# Patient Record
Sex: Female | Born: 1940
Health system: Southern US, Community
[De-identification: ages and names within clinical notes are randomized; demographics above are authoritative.]

## PROBLEM LIST (undated history)

## (undated) DIAGNOSIS — M545 Low back pain: Secondary | ICD-10-CM

## (undated) DIAGNOSIS — M549 Dorsalgia, unspecified: Secondary | ICD-10-CM

## (undated) DIAGNOSIS — C801 Malignant (primary) neoplasm, unspecified: Secondary | ICD-10-CM

## (undated) DIAGNOSIS — M1990 Unspecified osteoarthritis, unspecified site: Secondary | ICD-10-CM

## (undated) DIAGNOSIS — H04123 Dry eye syndrome of bilateral lacrimal glands: Secondary | ICD-10-CM

## (undated) DIAGNOSIS — I10 Essential (primary) hypertension: Secondary | ICD-10-CM

## (undated) DIAGNOSIS — O9989 Other specified diseases and conditions complicating pregnancy, childbirth and the puerperium: Secondary | ICD-10-CM

## (undated) DIAGNOSIS — J45909 Unspecified asthma, uncomplicated: Secondary | ICD-10-CM

## (undated) DIAGNOSIS — G250 Essential tremor: Secondary | ICD-10-CM

## (undated) DIAGNOSIS — C50919 Malignant neoplasm of unspecified site of unspecified female breast: Secondary | ICD-10-CM

## (undated) DIAGNOSIS — Z0000 Encounter for general adult medical examination without abnormal findings: Secondary | ICD-10-CM

## (undated) DIAGNOSIS — E782 Mixed hyperlipidemia: Secondary | ICD-10-CM

## (undated) DIAGNOSIS — M4802 Spinal stenosis, cervical region: Secondary | ICD-10-CM

## (undated) DIAGNOSIS — E669 Obesity, unspecified: Secondary | ICD-10-CM

## (undated) DIAGNOSIS — Z8619 Personal history of other infectious and parasitic diseases: Secondary | ICD-10-CM

## (undated) HISTORY — PX: CHOLECYSTECTOMY: SHX55

## (undated) HISTORY — DX: Mixed hyperlipidemia: E78.2

## (undated) HISTORY — DX: Dorsalgia, unspecified: M54.9

## (undated) HISTORY — PX: REDUCTION MAMMAPLASTY: SUR839

## (undated) HISTORY — DX: Unspecified osteoarthritis, unspecified site: M19.90

## (undated) HISTORY — DX: Dry eye syndrome of bilateral lacrimal glands: H04.123

## (undated) HISTORY — DX: Low back pain: M54.5

## (undated) HISTORY — DX: Personal history of other infectious and parasitic diseases: Z86.19

## (undated) HISTORY — PX: BREAST REDUCTION SURGERY: SHX8

## (undated) HISTORY — DX: Other specified diseases and conditions complicating pregnancy, childbirth and the puerperium: O99.89

## (undated) HISTORY — DX: Unspecified asthma, uncomplicated: J45.909

## (undated) HISTORY — DX: Essential tremor: G25.0

## (undated) HISTORY — DX: Malignant (primary) neoplasm, unspecified: C80.1

## (undated) HISTORY — PX: BACK SURGERY: SHX140

## (undated) HISTORY — PX: ROTATOR CUFF REPAIR: SHX139

## (undated) HISTORY — PX: REPLACEMENT TOTAL KNEE BILATERAL: SUR1225

## (undated) HISTORY — DX: Spinal stenosis, cervical region: M48.02

## (undated) HISTORY — DX: Encounter for general adult medical examination without abnormal findings: Z00.00

## (undated) HISTORY — DX: Obesity, unspecified: E66.9

## (undated) HISTORY — DX: Essential (primary) hypertension: I10

## (undated) HISTORY — PX: MASTECTOMY: SHX3

---

## 2006-02-07 HISTORY — PX: EYE SURGERY: SHX253

## 2010-10-28 LAB — HM DEXA SCAN

## 2011-10-28 LAB — HM MAMMOGRAPHY

## 2012-11-07 LAB — HM COLONOSCOPY

## 2013-02-19 DIAGNOSIS — M669 Spontaneous rupture of unspecified tendon: Secondary | ICD-10-CM | POA: Diagnosis not present

## 2013-02-19 DIAGNOSIS — M19079 Primary osteoarthritis, unspecified ankle and foot: Secondary | ICD-10-CM | POA: Diagnosis not present

## 2013-02-19 DIAGNOSIS — M1710 Unilateral primary osteoarthritis, unspecified knee: Secondary | ICD-10-CM | POA: Diagnosis not present

## 2013-02-19 DIAGNOSIS — M25579 Pain in unspecified ankle and joints of unspecified foot: Secondary | ICD-10-CM | POA: Diagnosis not present

## 2013-03-04 DIAGNOSIS — J45909 Unspecified asthma, uncomplicated: Secondary | ICD-10-CM | POA: Diagnosis not present

## 2013-03-04 DIAGNOSIS — M949 Disorder of cartilage, unspecified: Secondary | ICD-10-CM | POA: Diagnosis not present

## 2013-03-04 DIAGNOSIS — E782 Mixed hyperlipidemia: Secondary | ICD-10-CM | POA: Diagnosis not present

## 2013-03-04 DIAGNOSIS — R259 Unspecified abnormal involuntary movements: Secondary | ICD-10-CM | POA: Diagnosis not present

## 2013-03-04 DIAGNOSIS — M545 Low back pain, unspecified: Secondary | ICD-10-CM | POA: Diagnosis not present

## 2013-03-04 DIAGNOSIS — M79609 Pain in unspecified limb: Secondary | ICD-10-CM | POA: Diagnosis not present

## 2013-03-04 DIAGNOSIS — M899 Disorder of bone, unspecified: Secondary | ICD-10-CM | POA: Diagnosis not present

## 2013-03-04 DIAGNOSIS — I10 Essential (primary) hypertension: Secondary | ICD-10-CM | POA: Diagnosis not present

## 2013-03-04 DIAGNOSIS — Z01818 Encounter for other preprocedural examination: Secondary | ICD-10-CM | POA: Diagnosis not present

## 2013-03-05 DIAGNOSIS — Z01818 Encounter for other preprocedural examination: Secondary | ICD-10-CM | POA: Diagnosis not present

## 2013-03-05 DIAGNOSIS — E782 Mixed hyperlipidemia: Secondary | ICD-10-CM | POA: Diagnosis not present

## 2013-03-11 DIAGNOSIS — Z7982 Long term (current) use of aspirin: Secondary | ICD-10-CM | POA: Diagnosis not present

## 2013-03-11 DIAGNOSIS — G250 Essential tremor: Secondary | ICD-10-CM | POA: Diagnosis present

## 2013-03-11 DIAGNOSIS — Z4889 Encounter for other specified surgical aftercare: Secondary | ICD-10-CM | POA: Diagnosis not present

## 2013-03-11 DIAGNOSIS — Z96659 Presence of unspecified artificial knee joint: Secondary | ICD-10-CM | POA: Diagnosis not present

## 2013-03-11 DIAGNOSIS — Z471 Aftercare following joint replacement surgery: Secondary | ICD-10-CM | POA: Diagnosis not present

## 2013-03-11 DIAGNOSIS — Z880 Allergy status to penicillin: Secondary | ICD-10-CM | POA: Diagnosis not present

## 2013-03-11 DIAGNOSIS — E669 Obesity, unspecified: Secondary | ICD-10-CM | POA: Diagnosis not present

## 2013-03-11 DIAGNOSIS — Z79899 Other long term (current) drug therapy: Secondary | ICD-10-CM | POA: Diagnosis not present

## 2013-03-11 DIAGNOSIS — D72829 Elevated white blood cell count, unspecified: Secondary | ICD-10-CM | POA: Diagnosis not present

## 2013-03-11 DIAGNOSIS — Z9089 Acquired absence of other organs: Secondary | ICD-10-CM | POA: Diagnosis not present

## 2013-03-11 DIAGNOSIS — Z6841 Body Mass Index (BMI) 40.0 and over, adult: Secondary | ICD-10-CM | POA: Diagnosis not present

## 2013-03-11 DIAGNOSIS — I10 Essential (primary) hypertension: Secondary | ICD-10-CM | POA: Diagnosis not present

## 2013-03-11 DIAGNOSIS — M25569 Pain in unspecified knee: Secondary | ICD-10-CM | POA: Diagnosis not present

## 2013-03-11 DIAGNOSIS — M1710 Unilateral primary osteoarthritis, unspecified knee: Secondary | ICD-10-CM | POA: Diagnosis not present

## 2013-03-14 DIAGNOSIS — M1710 Unilateral primary osteoarthritis, unspecified knee: Secondary | ICD-10-CM | POA: Diagnosis not present

## 2013-03-14 DIAGNOSIS — E669 Obesity, unspecified: Secondary | ICD-10-CM | POA: Diagnosis not present

## 2013-03-14 DIAGNOSIS — R5381 Other malaise: Secondary | ICD-10-CM | POA: Diagnosis not present

## 2013-03-14 DIAGNOSIS — Z6841 Body Mass Index (BMI) 40.0 and over, adult: Secondary | ICD-10-CM | POA: Diagnosis not present

## 2013-03-14 DIAGNOSIS — Z853 Personal history of malignant neoplasm of breast: Secondary | ICD-10-CM | POA: Diagnosis not present

## 2013-03-14 DIAGNOSIS — Z5189 Encounter for other specified aftercare: Secondary | ICD-10-CM | POA: Diagnosis not present

## 2013-03-14 DIAGNOSIS — Z471 Aftercare following joint replacement surgery: Secondary | ICD-10-CM | POA: Diagnosis not present

## 2013-03-14 DIAGNOSIS — B965 Pseudomonas (aeruginosa) (mallei) (pseudomallei) as the cause of diseases classified elsewhere: Secondary | ICD-10-CM | POA: Diagnosis not present

## 2013-03-14 DIAGNOSIS — N390 Urinary tract infection, site not specified: Secondary | ICD-10-CM | POA: Diagnosis not present

## 2013-03-14 DIAGNOSIS — D72829 Elevated white blood cell count, unspecified: Secondary | ICD-10-CM | POA: Diagnosis not present

## 2013-03-14 DIAGNOSIS — I10 Essential (primary) hypertension: Secondary | ICD-10-CM | POA: Diagnosis not present

## 2013-03-14 DIAGNOSIS — M1990 Unspecified osteoarthritis, unspecified site: Secondary | ICD-10-CM | POA: Diagnosis not present

## 2013-03-14 DIAGNOSIS — R269 Unspecified abnormalities of gait and mobility: Secondary | ICD-10-CM | POA: Diagnosis not present

## 2013-03-14 DIAGNOSIS — Z9089 Acquired absence of other organs: Secondary | ICD-10-CM | POA: Diagnosis not present

## 2013-03-14 DIAGNOSIS — Z96659 Presence of unspecified artificial knee joint: Secondary | ICD-10-CM | POA: Diagnosis not present

## 2013-03-14 DIAGNOSIS — Z9010 Acquired absence of unspecified breast and nipple: Secondary | ICD-10-CM | POA: Diagnosis not present

## 2013-03-14 DIAGNOSIS — IMO0002 Reserved for concepts with insufficient information to code with codable children: Secondary | ICD-10-CM | POA: Diagnosis not present

## 2013-03-14 DIAGNOSIS — G250 Essential tremor: Secondary | ICD-10-CM | POA: Diagnosis not present

## 2013-03-29 DIAGNOSIS — R269 Unspecified abnormalities of gait and mobility: Secondary | ICD-10-CM | POA: Diagnosis not present

## 2013-03-29 DIAGNOSIS — G8918 Other acute postprocedural pain: Secondary | ICD-10-CM | POA: Diagnosis not present

## 2013-03-29 DIAGNOSIS — Z96659 Presence of unspecified artificial knee joint: Secondary | ICD-10-CM | POA: Diagnosis not present

## 2013-03-29 DIAGNOSIS — Z471 Aftercare following joint replacement surgery: Secondary | ICD-10-CM | POA: Diagnosis not present

## 2013-03-29 DIAGNOSIS — I119 Hypertensive heart disease without heart failure: Secondary | ICD-10-CM | POA: Diagnosis not present

## 2013-03-29 DIAGNOSIS — I2510 Atherosclerotic heart disease of native coronary artery without angina pectoris: Secondary | ICD-10-CM | POA: Diagnosis not present

## 2013-03-30 DIAGNOSIS — Z96659 Presence of unspecified artificial knee joint: Secondary | ICD-10-CM | POA: Diagnosis not present

## 2013-03-30 DIAGNOSIS — I119 Hypertensive heart disease without heart failure: Secondary | ICD-10-CM | POA: Diagnosis not present

## 2013-03-30 DIAGNOSIS — I2510 Atherosclerotic heart disease of native coronary artery without angina pectoris: Secondary | ICD-10-CM | POA: Diagnosis not present

## 2013-03-30 DIAGNOSIS — G8918 Other acute postprocedural pain: Secondary | ICD-10-CM | POA: Diagnosis not present

## 2013-03-30 DIAGNOSIS — R269 Unspecified abnormalities of gait and mobility: Secondary | ICD-10-CM | POA: Diagnosis not present

## 2013-03-30 DIAGNOSIS — Z471 Aftercare following joint replacement surgery: Secondary | ICD-10-CM | POA: Diagnosis not present

## 2013-04-02 DIAGNOSIS — R269 Unspecified abnormalities of gait and mobility: Secondary | ICD-10-CM | POA: Diagnosis not present

## 2013-04-02 DIAGNOSIS — Z96659 Presence of unspecified artificial knee joint: Secondary | ICD-10-CM | POA: Diagnosis not present

## 2013-04-02 DIAGNOSIS — Z471 Aftercare following joint replacement surgery: Secondary | ICD-10-CM | POA: Diagnosis not present

## 2013-04-02 DIAGNOSIS — G8918 Other acute postprocedural pain: Secondary | ICD-10-CM | POA: Diagnosis not present

## 2013-04-02 DIAGNOSIS — I119 Hypertensive heart disease without heart failure: Secondary | ICD-10-CM | POA: Diagnosis not present

## 2013-04-02 DIAGNOSIS — I2510 Atherosclerotic heart disease of native coronary artery without angina pectoris: Secondary | ICD-10-CM | POA: Diagnosis not present

## 2013-04-04 DIAGNOSIS — I119 Hypertensive heart disease without heart failure: Secondary | ICD-10-CM | POA: Diagnosis not present

## 2013-04-04 DIAGNOSIS — G8918 Other acute postprocedural pain: Secondary | ICD-10-CM | POA: Diagnosis not present

## 2013-04-04 DIAGNOSIS — I2510 Atherosclerotic heart disease of native coronary artery without angina pectoris: Secondary | ICD-10-CM | POA: Diagnosis not present

## 2013-04-04 DIAGNOSIS — R269 Unspecified abnormalities of gait and mobility: Secondary | ICD-10-CM | POA: Diagnosis not present

## 2013-04-04 DIAGNOSIS — Z471 Aftercare following joint replacement surgery: Secondary | ICD-10-CM | POA: Diagnosis not present

## 2013-04-04 DIAGNOSIS — Z96659 Presence of unspecified artificial knee joint: Secondary | ICD-10-CM | POA: Diagnosis not present

## 2013-04-05 DIAGNOSIS — S43429A Sprain of unspecified rotator cuff capsule, initial encounter: Secondary | ICD-10-CM | POA: Diagnosis not present

## 2013-04-05 DIAGNOSIS — M1710 Unilateral primary osteoarthritis, unspecified knee: Secondary | ICD-10-CM | POA: Diagnosis not present

## 2013-04-06 DIAGNOSIS — I2510 Atherosclerotic heart disease of native coronary artery without angina pectoris: Secondary | ICD-10-CM | POA: Diagnosis not present

## 2013-04-06 DIAGNOSIS — G8918 Other acute postprocedural pain: Secondary | ICD-10-CM | POA: Diagnosis not present

## 2013-04-06 DIAGNOSIS — R269 Unspecified abnormalities of gait and mobility: Secondary | ICD-10-CM | POA: Diagnosis not present

## 2013-04-06 DIAGNOSIS — Z96659 Presence of unspecified artificial knee joint: Secondary | ICD-10-CM | POA: Diagnosis not present

## 2013-04-06 DIAGNOSIS — I119 Hypertensive heart disease without heart failure: Secondary | ICD-10-CM | POA: Diagnosis not present

## 2013-04-06 DIAGNOSIS — Z471 Aftercare following joint replacement surgery: Secondary | ICD-10-CM | POA: Diagnosis not present

## 2013-04-09 DIAGNOSIS — I2510 Atherosclerotic heart disease of native coronary artery without angina pectoris: Secondary | ICD-10-CM | POA: Diagnosis not present

## 2013-04-09 DIAGNOSIS — R269 Unspecified abnormalities of gait and mobility: Secondary | ICD-10-CM | POA: Diagnosis not present

## 2013-04-09 DIAGNOSIS — Z471 Aftercare following joint replacement surgery: Secondary | ICD-10-CM | POA: Diagnosis not present

## 2013-04-09 DIAGNOSIS — G8918 Other acute postprocedural pain: Secondary | ICD-10-CM | POA: Diagnosis not present

## 2013-04-09 DIAGNOSIS — Z96659 Presence of unspecified artificial knee joint: Secondary | ICD-10-CM | POA: Diagnosis not present

## 2013-04-09 DIAGNOSIS — I119 Hypertensive heart disease without heart failure: Secondary | ICD-10-CM | POA: Diagnosis not present

## 2013-04-10 DIAGNOSIS — R0602 Shortness of breath: Secondary | ICD-10-CM | POA: Diagnosis not present

## 2013-04-10 DIAGNOSIS — I10 Essential (primary) hypertension: Secondary | ICD-10-CM | POA: Diagnosis not present

## 2013-04-10 DIAGNOSIS — I779 Disorder of arteries and arterioles, unspecified: Secondary | ICD-10-CM | POA: Diagnosis not present

## 2013-04-10 DIAGNOSIS — E669 Obesity, unspecified: Secondary | ICD-10-CM | POA: Diagnosis not present

## 2013-04-11 DIAGNOSIS — G8918 Other acute postprocedural pain: Secondary | ICD-10-CM | POA: Diagnosis not present

## 2013-04-11 DIAGNOSIS — I2510 Atherosclerotic heart disease of native coronary artery without angina pectoris: Secondary | ICD-10-CM | POA: Diagnosis not present

## 2013-04-11 DIAGNOSIS — R269 Unspecified abnormalities of gait and mobility: Secondary | ICD-10-CM | POA: Diagnosis not present

## 2013-04-11 DIAGNOSIS — I119 Hypertensive heart disease without heart failure: Secondary | ICD-10-CM | POA: Diagnosis not present

## 2013-04-11 DIAGNOSIS — Z471 Aftercare following joint replacement surgery: Secondary | ICD-10-CM | POA: Diagnosis not present

## 2013-04-11 DIAGNOSIS — Z96659 Presence of unspecified artificial knee joint: Secondary | ICD-10-CM | POA: Diagnosis not present

## 2013-04-13 DIAGNOSIS — Z96659 Presence of unspecified artificial knee joint: Secondary | ICD-10-CM | POA: Diagnosis not present

## 2013-04-13 DIAGNOSIS — R269 Unspecified abnormalities of gait and mobility: Secondary | ICD-10-CM | POA: Diagnosis not present

## 2013-04-13 DIAGNOSIS — I119 Hypertensive heart disease without heart failure: Secondary | ICD-10-CM | POA: Diagnosis not present

## 2013-04-13 DIAGNOSIS — Z471 Aftercare following joint replacement surgery: Secondary | ICD-10-CM | POA: Diagnosis not present

## 2013-04-13 DIAGNOSIS — G8918 Other acute postprocedural pain: Secondary | ICD-10-CM | POA: Diagnosis not present

## 2013-04-13 DIAGNOSIS — I2510 Atherosclerotic heart disease of native coronary artery without angina pectoris: Secondary | ICD-10-CM | POA: Diagnosis not present

## 2013-04-15 DIAGNOSIS — I2510 Atherosclerotic heart disease of native coronary artery without angina pectoris: Secondary | ICD-10-CM | POA: Diagnosis not present

## 2013-04-15 DIAGNOSIS — I119 Hypertensive heart disease without heart failure: Secondary | ICD-10-CM | POA: Diagnosis not present

## 2013-04-15 DIAGNOSIS — R269 Unspecified abnormalities of gait and mobility: Secondary | ICD-10-CM | POA: Diagnosis not present

## 2013-04-15 DIAGNOSIS — Z96659 Presence of unspecified artificial knee joint: Secondary | ICD-10-CM | POA: Diagnosis not present

## 2013-04-15 DIAGNOSIS — G8918 Other acute postprocedural pain: Secondary | ICD-10-CM | POA: Diagnosis not present

## 2013-04-15 DIAGNOSIS — Z471 Aftercare following joint replacement surgery: Secondary | ICD-10-CM | POA: Diagnosis not present

## 2013-04-17 DIAGNOSIS — R269 Unspecified abnormalities of gait and mobility: Secondary | ICD-10-CM | POA: Diagnosis not present

## 2013-04-17 DIAGNOSIS — I2510 Atherosclerotic heart disease of native coronary artery without angina pectoris: Secondary | ICD-10-CM | POA: Diagnosis not present

## 2013-04-17 DIAGNOSIS — I119 Hypertensive heart disease without heart failure: Secondary | ICD-10-CM | POA: Diagnosis not present

## 2013-04-17 DIAGNOSIS — Z471 Aftercare following joint replacement surgery: Secondary | ICD-10-CM | POA: Diagnosis not present

## 2013-04-17 DIAGNOSIS — Z96659 Presence of unspecified artificial knee joint: Secondary | ICD-10-CM | POA: Diagnosis not present

## 2013-04-17 DIAGNOSIS — G8918 Other acute postprocedural pain: Secondary | ICD-10-CM | POA: Diagnosis not present

## 2013-04-20 DIAGNOSIS — Z96659 Presence of unspecified artificial knee joint: Secondary | ICD-10-CM | POA: Diagnosis not present

## 2013-04-20 DIAGNOSIS — R269 Unspecified abnormalities of gait and mobility: Secondary | ICD-10-CM | POA: Diagnosis not present

## 2013-04-20 DIAGNOSIS — Z471 Aftercare following joint replacement surgery: Secondary | ICD-10-CM | POA: Diagnosis not present

## 2013-04-20 DIAGNOSIS — I119 Hypertensive heart disease without heart failure: Secondary | ICD-10-CM | POA: Diagnosis not present

## 2013-04-20 DIAGNOSIS — G8918 Other acute postprocedural pain: Secondary | ICD-10-CM | POA: Diagnosis not present

## 2013-04-20 DIAGNOSIS — I2510 Atherosclerotic heart disease of native coronary artery without angina pectoris: Secondary | ICD-10-CM | POA: Diagnosis not present

## 2013-04-24 DIAGNOSIS — G252 Other specified forms of tremor: Secondary | ICD-10-CM | POA: Diagnosis not present

## 2013-04-24 DIAGNOSIS — I10 Essential (primary) hypertension: Secondary | ICD-10-CM | POA: Diagnosis not present

## 2013-04-24 DIAGNOSIS — G2581 Restless legs syndrome: Secondary | ICD-10-CM | POA: Diagnosis not present

## 2013-04-24 DIAGNOSIS — G250 Essential tremor: Secondary | ICD-10-CM | POA: Diagnosis not present

## 2013-04-24 DIAGNOSIS — R252 Cramp and spasm: Secondary | ICD-10-CM | POA: Diagnosis not present

## 2013-04-26 DIAGNOSIS — Z471 Aftercare following joint replacement surgery: Secondary | ICD-10-CM | POA: Diagnosis not present

## 2013-04-26 DIAGNOSIS — I119 Hypertensive heart disease without heart failure: Secondary | ICD-10-CM | POA: Diagnosis not present

## 2013-04-26 DIAGNOSIS — R269 Unspecified abnormalities of gait and mobility: Secondary | ICD-10-CM | POA: Diagnosis not present

## 2013-04-26 DIAGNOSIS — Z96659 Presence of unspecified artificial knee joint: Secondary | ICD-10-CM | POA: Diagnosis not present

## 2013-04-26 DIAGNOSIS — G8918 Other acute postprocedural pain: Secondary | ICD-10-CM | POA: Diagnosis not present

## 2013-04-26 DIAGNOSIS — I2510 Atherosclerotic heart disease of native coronary artery without angina pectoris: Secondary | ICD-10-CM | POA: Diagnosis not present

## 2013-05-30 DIAGNOSIS — I10 Essential (primary) hypertension: Secondary | ICD-10-CM | POA: Diagnosis not present

## 2013-05-30 DIAGNOSIS — E782 Mixed hyperlipidemia: Secondary | ICD-10-CM | POA: Diagnosis not present

## 2013-05-30 DIAGNOSIS — G250 Essential tremor: Secondary | ICD-10-CM | POA: Diagnosis not present

## 2013-05-30 DIAGNOSIS — G252 Other specified forms of tremor: Secondary | ICD-10-CM | POA: Diagnosis not present

## 2013-06-27 DIAGNOSIS — L570 Actinic keratosis: Secondary | ICD-10-CM | POA: Diagnosis not present

## 2013-07-12 DIAGNOSIS — M1710 Unilateral primary osteoarthritis, unspecified knee: Secondary | ICD-10-CM | POA: Diagnosis not present

## 2013-07-17 DIAGNOSIS — I10 Essential (primary) hypertension: Secondary | ICD-10-CM | POA: Diagnosis not present

## 2013-07-17 DIAGNOSIS — G252 Other specified forms of tremor: Secondary | ICD-10-CM | POA: Diagnosis not present

## 2013-07-17 DIAGNOSIS — G2581 Restless legs syndrome: Secondary | ICD-10-CM | POA: Diagnosis not present

## 2013-07-17 DIAGNOSIS — G250 Essential tremor: Secondary | ICD-10-CM | POA: Diagnosis not present

## 2013-09-11 DIAGNOSIS — Z01810 Encounter for preprocedural cardiovascular examination: Secondary | ICD-10-CM | POA: Diagnosis not present

## 2013-09-11 DIAGNOSIS — M25569 Pain in unspecified knee: Secondary | ICD-10-CM | POA: Diagnosis not present

## 2013-09-13 DIAGNOSIS — Z01810 Encounter for preprocedural cardiovascular examination: Secondary | ICD-10-CM | POA: Diagnosis not present

## 2013-09-13 DIAGNOSIS — M1710 Unilateral primary osteoarthritis, unspecified knee: Secondary | ICD-10-CM | POA: Diagnosis not present

## 2013-09-13 DIAGNOSIS — Z01818 Encounter for other preprocedural examination: Secondary | ICD-10-CM | POA: Diagnosis not present

## 2013-09-16 DIAGNOSIS — M76829 Posterior tibial tendinitis, unspecified leg: Secondary | ICD-10-CM | POA: Diagnosis not present

## 2013-09-16 DIAGNOSIS — M25579 Pain in unspecified ankle and joints of unspecified foot: Secondary | ICD-10-CM | POA: Diagnosis not present

## 2013-09-16 DIAGNOSIS — B351 Tinea unguium: Secondary | ICD-10-CM | POA: Diagnosis not present

## 2013-09-18 DIAGNOSIS — A488 Other specified bacterial diseases: Secondary | ICD-10-CM | POA: Diagnosis not present

## 2013-09-18 DIAGNOSIS — Z22330 Carrier of Group B streptococcus: Secondary | ICD-10-CM | POA: Diagnosis not present

## 2013-09-18 DIAGNOSIS — Z2239 Carrier of other specified bacterial diseases: Secondary | ICD-10-CM | POA: Diagnosis not present

## 2013-09-19 DIAGNOSIS — M25579 Pain in unspecified ankle and joints of unspecified foot: Secondary | ICD-10-CM | POA: Diagnosis not present

## 2013-09-23 DIAGNOSIS — M25473 Effusion, unspecified ankle: Secondary | ICD-10-CM | POA: Diagnosis not present

## 2013-09-23 DIAGNOSIS — M25579 Pain in unspecified ankle and joints of unspecified foot: Secondary | ICD-10-CM | POA: Diagnosis not present

## 2013-09-23 DIAGNOSIS — R609 Edema, unspecified: Secondary | ICD-10-CM | POA: Diagnosis not present

## 2013-09-23 DIAGNOSIS — M722 Plantar fascial fibromatosis: Secondary | ICD-10-CM | POA: Diagnosis not present

## 2013-09-27 DIAGNOSIS — M25579 Pain in unspecified ankle and joints of unspecified foot: Secondary | ICD-10-CM | POA: Diagnosis not present

## 2013-09-27 DIAGNOSIS — M729 Fibroblastic disorder, unspecified: Secondary | ICD-10-CM | POA: Diagnosis not present

## 2013-09-30 DIAGNOSIS — Z96659 Presence of unspecified artificial knee joint: Secondary | ICD-10-CM | POA: Diagnosis not present

## 2013-09-30 DIAGNOSIS — M25569 Pain in unspecified knee: Secondary | ICD-10-CM | POA: Diagnosis not present

## 2013-09-30 DIAGNOSIS — M729 Fibroblastic disorder, unspecified: Secondary | ICD-10-CM | POA: Diagnosis not present

## 2013-09-30 DIAGNOSIS — M25579 Pain in unspecified ankle and joints of unspecified foot: Secondary | ICD-10-CM | POA: Diagnosis not present

## 2013-10-02 DIAGNOSIS — M25569 Pain in unspecified knee: Secondary | ICD-10-CM | POA: Diagnosis not present

## 2013-10-02 DIAGNOSIS — M729 Fibroblastic disorder, unspecified: Secondary | ICD-10-CM | POA: Diagnosis not present

## 2013-10-02 DIAGNOSIS — M25579 Pain in unspecified ankle and joints of unspecified foot: Secondary | ICD-10-CM | POA: Diagnosis not present

## 2013-10-02 DIAGNOSIS — Z96659 Presence of unspecified artificial knee joint: Secondary | ICD-10-CM | POA: Diagnosis not present

## 2013-10-03 DIAGNOSIS — M729 Fibroblastic disorder, unspecified: Secondary | ICD-10-CM | POA: Diagnosis not present

## 2013-10-03 DIAGNOSIS — M25579 Pain in unspecified ankle and joints of unspecified foot: Secondary | ICD-10-CM | POA: Diagnosis not present

## 2013-10-07 DIAGNOSIS — I10 Essential (primary) hypertension: Secondary | ICD-10-CM | POA: Diagnosis not present

## 2013-10-07 DIAGNOSIS — M1990 Unspecified osteoarthritis, unspecified site: Secondary | ICD-10-CM | POA: Diagnosis not present

## 2013-10-07 DIAGNOSIS — Z96659 Presence of unspecified artificial knee joint: Secondary | ICD-10-CM | POA: Diagnosis not present

## 2013-10-07 DIAGNOSIS — R7309 Other abnormal glucose: Secondary | ICD-10-CM | POA: Diagnosis not present

## 2013-10-07 DIAGNOSIS — M25569 Pain in unspecified knee: Secondary | ICD-10-CM | POA: Diagnosis not present

## 2013-10-07 DIAGNOSIS — M1710 Unilateral primary osteoarthritis, unspecified knee: Secondary | ICD-10-CM | POA: Diagnosis not present

## 2013-10-07 DIAGNOSIS — Z4889 Encounter for other specified surgical aftercare: Secondary | ICD-10-CM | POA: Diagnosis not present

## 2013-10-08 DIAGNOSIS — M79609 Pain in unspecified limb: Secondary | ICD-10-CM | POA: Diagnosis not present

## 2013-10-08 DIAGNOSIS — M549 Dorsalgia, unspecified: Secondary | ICD-10-CM | POA: Diagnosis present

## 2013-10-08 DIAGNOSIS — M1990 Unspecified osteoarthritis, unspecified site: Secondary | ICD-10-CM | POA: Diagnosis present

## 2013-10-08 DIAGNOSIS — I10 Essential (primary) hypertension: Secondary | ICD-10-CM | POA: Diagnosis present

## 2013-10-08 DIAGNOSIS — Z9010 Acquired absence of unspecified breast and nipple: Secondary | ICD-10-CM | POA: Diagnosis not present

## 2013-10-08 DIAGNOSIS — R7309 Other abnormal glucose: Secondary | ICD-10-CM | POA: Diagnosis present

## 2013-10-08 DIAGNOSIS — K219 Gastro-esophageal reflux disease without esophagitis: Secondary | ICD-10-CM | POA: Diagnosis present

## 2013-10-08 DIAGNOSIS — Z9221 Personal history of antineoplastic chemotherapy: Secondary | ICD-10-CM | POA: Diagnosis not present

## 2013-10-08 DIAGNOSIS — Z853 Personal history of malignant neoplasm of breast: Secondary | ICD-10-CM | POA: Diagnosis not present

## 2013-10-10 DIAGNOSIS — A498 Other bacterial infections of unspecified site: Secondary | ICD-10-CM | POA: Diagnosis not present

## 2013-10-10 DIAGNOSIS — M25569 Pain in unspecified knee: Secondary | ICD-10-CM | POA: Diagnosis not present

## 2013-10-10 DIAGNOSIS — R269 Unspecified abnormalities of gait and mobility: Secondary | ICD-10-CM | POA: Diagnosis not present

## 2013-10-10 DIAGNOSIS — M549 Dorsalgia, unspecified: Secondary | ICD-10-CM | POA: Diagnosis not present

## 2013-10-10 DIAGNOSIS — D649 Anemia, unspecified: Secondary | ICD-10-CM | POA: Diagnosis not present

## 2013-10-10 DIAGNOSIS — Z9089 Acquired absence of other organs: Secondary | ICD-10-CM | POA: Diagnosis not present

## 2013-10-10 DIAGNOSIS — I10 Essential (primary) hypertension: Secondary | ICD-10-CM | POA: Diagnosis not present

## 2013-10-10 DIAGNOSIS — M1990 Unspecified osteoarthritis, unspecified site: Secondary | ICD-10-CM | POA: Diagnosis not present

## 2013-10-10 DIAGNOSIS — N390 Urinary tract infection, site not specified: Secondary | ICD-10-CM | POA: Diagnosis not present

## 2013-10-10 DIAGNOSIS — Z853 Personal history of malignant neoplasm of breast: Secondary | ICD-10-CM | POA: Diagnosis not present

## 2013-10-10 DIAGNOSIS — K219 Gastro-esophageal reflux disease without esophagitis: Secondary | ICD-10-CM | POA: Diagnosis not present

## 2013-10-10 DIAGNOSIS — Z9010 Acquired absence of unspecified breast and nipple: Secondary | ICD-10-CM | POA: Diagnosis not present

## 2013-10-10 DIAGNOSIS — Z5189 Encounter for other specified aftercare: Secondary | ICD-10-CM | POA: Diagnosis not present

## 2013-10-10 DIAGNOSIS — R29898 Other symptoms and signs involving the musculoskeletal system: Secondary | ICD-10-CM | POA: Diagnosis not present

## 2013-10-10 DIAGNOSIS — Z471 Aftercare following joint replacement surgery: Secondary | ICD-10-CM | POA: Diagnosis not present

## 2013-10-10 DIAGNOSIS — Z96659 Presence of unspecified artificial knee joint: Secondary | ICD-10-CM | POA: Diagnosis not present

## 2013-10-10 DIAGNOSIS — E8809 Other disorders of plasma-protein metabolism, not elsewhere classified: Secondary | ICD-10-CM | POA: Diagnosis not present

## 2013-10-10 DIAGNOSIS — G8929 Other chronic pain: Secondary | ICD-10-CM | POA: Diagnosis not present

## 2013-10-29 DIAGNOSIS — M25569 Pain in unspecified knee: Secondary | ICD-10-CM | POA: Diagnosis not present

## 2013-10-29 DIAGNOSIS — Z96659 Presence of unspecified artificial knee joint: Secondary | ICD-10-CM | POA: Diagnosis not present

## 2013-10-30 DIAGNOSIS — M25569 Pain in unspecified knee: Secondary | ICD-10-CM | POA: Diagnosis not present

## 2013-10-30 DIAGNOSIS — Z96659 Presence of unspecified artificial knee joint: Secondary | ICD-10-CM | POA: Diagnosis not present

## 2013-11-01 DIAGNOSIS — Z96659 Presence of unspecified artificial knee joint: Secondary | ICD-10-CM | POA: Diagnosis not present

## 2013-11-01 DIAGNOSIS — M25569 Pain in unspecified knee: Secondary | ICD-10-CM | POA: Diagnosis not present

## 2013-11-04 DIAGNOSIS — Z96659 Presence of unspecified artificial knee joint: Secondary | ICD-10-CM | POA: Diagnosis not present

## 2013-11-04 DIAGNOSIS — M25569 Pain in unspecified knee: Secondary | ICD-10-CM | POA: Diagnosis not present

## 2013-11-05 DIAGNOSIS — M76829 Posterior tibial tendinitis, unspecified leg: Secondary | ICD-10-CM | POA: Diagnosis not present

## 2013-11-05 DIAGNOSIS — Z471 Aftercare following joint replacement surgery: Secondary | ICD-10-CM | POA: Diagnosis not present

## 2013-11-05 DIAGNOSIS — M1710 Unilateral primary osteoarthritis, unspecified knee: Secondary | ICD-10-CM | POA: Diagnosis not present

## 2013-11-05 DIAGNOSIS — M25579 Pain in unspecified ankle and joints of unspecified foot: Secondary | ICD-10-CM | POA: Diagnosis not present

## 2013-11-05 DIAGNOSIS — M25569 Pain in unspecified knee: Secondary | ICD-10-CM | POA: Diagnosis not present

## 2013-11-06 DIAGNOSIS — Z96659 Presence of unspecified artificial knee joint: Secondary | ICD-10-CM | POA: Diagnosis not present

## 2013-11-06 DIAGNOSIS — M25569 Pain in unspecified knee: Secondary | ICD-10-CM | POA: Diagnosis not present

## 2013-11-08 DIAGNOSIS — M1712 Unilateral primary osteoarthritis, left knee: Secondary | ICD-10-CM | POA: Diagnosis not present

## 2013-11-11 DIAGNOSIS — M1712 Unilateral primary osteoarthritis, left knee: Secondary | ICD-10-CM | POA: Diagnosis not present

## 2013-11-11 DIAGNOSIS — I10 Essential (primary) hypertension: Secondary | ICD-10-CM | POA: Diagnosis not present

## 2013-11-11 DIAGNOSIS — I672 Cerebral atherosclerosis: Secondary | ICD-10-CM | POA: Diagnosis not present

## 2013-11-11 DIAGNOSIS — I340 Nonrheumatic mitral (valve) insufficiency: Secondary | ICD-10-CM | POA: Diagnosis not present

## 2013-11-11 DIAGNOSIS — R600 Localized edema: Secondary | ICD-10-CM | POA: Diagnosis not present

## 2013-11-13 DIAGNOSIS — M1712 Unilateral primary osteoarthritis, left knee: Secondary | ICD-10-CM | POA: Diagnosis not present

## 2013-11-15 DIAGNOSIS — M1712 Unilateral primary osteoarthritis, left knee: Secondary | ICD-10-CM | POA: Diagnosis not present

## 2013-11-18 DIAGNOSIS — M1712 Unilateral primary osteoarthritis, left knee: Secondary | ICD-10-CM | POA: Diagnosis not present

## 2013-11-20 DIAGNOSIS — M1712 Unilateral primary osteoarthritis, left knee: Secondary | ICD-10-CM | POA: Diagnosis not present

## 2013-11-22 DIAGNOSIS — M1712 Unilateral primary osteoarthritis, left knee: Secondary | ICD-10-CM | POA: Diagnosis not present

## 2013-11-25 DIAGNOSIS — M1712 Unilateral primary osteoarthritis, left knee: Secondary | ICD-10-CM | POA: Diagnosis not present

## 2013-11-27 DIAGNOSIS — M1712 Unilateral primary osteoarthritis, left knee: Secondary | ICD-10-CM | POA: Diagnosis not present

## 2013-11-29 DIAGNOSIS — M1712 Unilateral primary osteoarthritis, left knee: Secondary | ICD-10-CM | POA: Diagnosis not present

## 2013-12-02 DIAGNOSIS — M1712 Unilateral primary osteoarthritis, left knee: Secondary | ICD-10-CM | POA: Diagnosis not present

## 2013-12-03 DIAGNOSIS — M1712 Unilateral primary osteoarthritis, left knee: Secondary | ICD-10-CM | POA: Diagnosis not present

## 2013-12-05 DIAGNOSIS — M1712 Unilateral primary osteoarthritis, left knee: Secondary | ICD-10-CM | POA: Diagnosis not present

## 2013-12-16 DIAGNOSIS — M1712 Unilateral primary osteoarthritis, left knee: Secondary | ICD-10-CM | POA: Diagnosis not present

## 2013-12-17 DIAGNOSIS — M5030 Other cervical disc degeneration, unspecified cervical region: Secondary | ICD-10-CM | POA: Diagnosis not present

## 2013-12-17 DIAGNOSIS — I10 Essential (primary) hypertension: Secondary | ICD-10-CM | POA: Diagnosis not present

## 2013-12-17 DIAGNOSIS — E782 Mixed hyperlipidemia: Secondary | ICD-10-CM | POA: Diagnosis not present

## 2013-12-18 DIAGNOSIS — M1712 Unilateral primary osteoarthritis, left knee: Secondary | ICD-10-CM | POA: Diagnosis not present

## 2013-12-27 DIAGNOSIS — Z471 Aftercare following joint replacement surgery: Secondary | ICD-10-CM | POA: Diagnosis not present

## 2013-12-27 DIAGNOSIS — M79605 Pain in left leg: Secondary | ICD-10-CM | POA: Diagnosis not present

## 2013-12-27 DIAGNOSIS — M545 Low back pain: Secondary | ICD-10-CM | POA: Diagnosis not present

## 2013-12-27 DIAGNOSIS — M7989 Other specified soft tissue disorders: Secondary | ICD-10-CM | POA: Diagnosis not present

## 2013-12-27 DIAGNOSIS — M1712 Unilateral primary osteoarthritis, left knee: Secondary | ICD-10-CM | POA: Diagnosis not present

## 2013-12-27 DIAGNOSIS — M4806 Spinal stenosis, lumbar region: Secondary | ICD-10-CM | POA: Diagnosis not present

## 2013-12-27 DIAGNOSIS — G8918 Other acute postprocedural pain: Secondary | ICD-10-CM | POA: Diagnosis not present

## 2014-02-10 DIAGNOSIS — L570 Actinic keratosis: Secondary | ICD-10-CM | POA: Diagnosis not present

## 2014-02-10 DIAGNOSIS — L821 Other seborrheic keratosis: Secondary | ICD-10-CM | POA: Diagnosis not present

## 2014-02-12 DIAGNOSIS — G2581 Restless legs syndrome: Secondary | ICD-10-CM | POA: Diagnosis not present

## 2014-02-12 DIAGNOSIS — G250 Essential tremor: Secondary | ICD-10-CM | POA: Diagnosis not present

## 2014-02-12 DIAGNOSIS — I10 Essential (primary) hypertension: Secondary | ICD-10-CM | POA: Diagnosis not present

## 2014-02-19 DIAGNOSIS — H04123 Dry eye syndrome of bilateral lacrimal glands: Secondary | ICD-10-CM | POA: Diagnosis not present

## 2014-02-19 DIAGNOSIS — Z471 Aftercare following joint replacement surgery: Secondary | ICD-10-CM | POA: Diagnosis not present

## 2014-02-19 DIAGNOSIS — M1712 Unilateral primary osteoarthritis, left knee: Secondary | ICD-10-CM | POA: Diagnosis not present

## 2014-02-19 DIAGNOSIS — Z961 Presence of intraocular lens: Secondary | ICD-10-CM | POA: Diagnosis not present

## 2014-02-19 DIAGNOSIS — M1711 Unilateral primary osteoarthritis, right knee: Secondary | ICD-10-CM | POA: Diagnosis not present

## 2014-02-19 DIAGNOSIS — H43813 Vitreous degeneration, bilateral: Secondary | ICD-10-CM | POA: Diagnosis not present

## 2014-02-19 DIAGNOSIS — M25561 Pain in right knee: Secondary | ICD-10-CM | POA: Diagnosis not present

## 2014-02-25 DIAGNOSIS — E782 Mixed hyperlipidemia: Secondary | ICD-10-CM | POA: Diagnosis not present

## 2014-02-25 DIAGNOSIS — I10 Essential (primary) hypertension: Secondary | ICD-10-CM | POA: Diagnosis not present

## 2014-02-25 DIAGNOSIS — M5030 Other cervical disc degeneration, unspecified cervical region: Secondary | ICD-10-CM | POA: Diagnosis not present

## 2014-02-26 DIAGNOSIS — M5030 Other cervical disc degeneration, unspecified cervical region: Secondary | ICD-10-CM | POA: Diagnosis not present

## 2014-02-26 DIAGNOSIS — E782 Mixed hyperlipidemia: Secondary | ICD-10-CM | POA: Diagnosis not present

## 2014-03-11 DIAGNOSIS — E782 Mixed hyperlipidemia: Secondary | ICD-10-CM | POA: Diagnosis not present

## 2014-03-11 DIAGNOSIS — I10 Essential (primary) hypertension: Secondary | ICD-10-CM | POA: Diagnosis not present

## 2014-03-11 DIAGNOSIS — M25562 Pain in left knee: Secondary | ICD-10-CM | POA: Diagnosis not present

## 2014-03-11 DIAGNOSIS — M5030 Other cervical disc degeneration, unspecified cervical region: Secondary | ICD-10-CM | POA: Diagnosis not present

## 2014-03-11 DIAGNOSIS — G250 Essential tremor: Secondary | ICD-10-CM | POA: Diagnosis not present

## 2014-03-11 DIAGNOSIS — M25561 Pain in right knee: Secondary | ICD-10-CM | POA: Diagnosis not present

## 2014-03-18 DIAGNOSIS — M25662 Stiffness of left knee, not elsewhere classified: Secondary | ICD-10-CM | POA: Diagnosis not present

## 2014-03-18 DIAGNOSIS — M25562 Pain in left knee: Secondary | ICD-10-CM | POA: Diagnosis not present

## 2014-03-18 DIAGNOSIS — R262 Difficulty in walking, not elsewhere classified: Secondary | ICD-10-CM | POA: Diagnosis not present

## 2014-03-18 DIAGNOSIS — D492 Neoplasm of unspecified behavior of bone, soft tissue, and skin: Secondary | ICD-10-CM | POA: Diagnosis not present

## 2014-03-21 DIAGNOSIS — M25562 Pain in left knee: Secondary | ICD-10-CM | POA: Diagnosis not present

## 2014-03-21 DIAGNOSIS — R262 Difficulty in walking, not elsewhere classified: Secondary | ICD-10-CM | POA: Diagnosis not present

## 2014-03-21 DIAGNOSIS — M25662 Stiffness of left knee, not elsewhere classified: Secondary | ICD-10-CM | POA: Diagnosis not present

## 2014-03-25 DIAGNOSIS — M25662 Stiffness of left knee, not elsewhere classified: Secondary | ICD-10-CM | POA: Diagnosis not present

## 2014-03-25 DIAGNOSIS — R262 Difficulty in walking, not elsewhere classified: Secondary | ICD-10-CM | POA: Diagnosis not present

## 2014-03-25 DIAGNOSIS — M25562 Pain in left knee: Secondary | ICD-10-CM | POA: Diagnosis not present

## 2014-03-27 DIAGNOSIS — M25662 Stiffness of left knee, not elsewhere classified: Secondary | ICD-10-CM | POA: Diagnosis not present

## 2014-03-27 DIAGNOSIS — M25562 Pain in left knee: Secondary | ICD-10-CM | POA: Diagnosis not present

## 2014-03-27 DIAGNOSIS — R262 Difficulty in walking, not elsewhere classified: Secondary | ICD-10-CM | POA: Diagnosis not present

## 2014-04-01 DIAGNOSIS — M25562 Pain in left knee: Secondary | ICD-10-CM | POA: Diagnosis not present

## 2014-04-01 DIAGNOSIS — M25662 Stiffness of left knee, not elsewhere classified: Secondary | ICD-10-CM | POA: Diagnosis not present

## 2014-04-01 DIAGNOSIS — R262 Difficulty in walking, not elsewhere classified: Secondary | ICD-10-CM | POA: Diagnosis not present

## 2014-04-03 DIAGNOSIS — M25662 Stiffness of left knee, not elsewhere classified: Secondary | ICD-10-CM | POA: Diagnosis not present

## 2014-04-03 DIAGNOSIS — M25562 Pain in left knee: Secondary | ICD-10-CM | POA: Diagnosis not present

## 2014-04-03 DIAGNOSIS — R262 Difficulty in walking, not elsewhere classified: Secondary | ICD-10-CM | POA: Diagnosis not present

## 2014-04-08 DIAGNOSIS — M25562 Pain in left knee: Secondary | ICD-10-CM | POA: Diagnosis not present

## 2014-04-08 DIAGNOSIS — M25662 Stiffness of left knee, not elsewhere classified: Secondary | ICD-10-CM | POA: Diagnosis not present

## 2014-04-08 DIAGNOSIS — R262 Difficulty in walking, not elsewhere classified: Secondary | ICD-10-CM | POA: Diagnosis not present

## 2014-04-10 DIAGNOSIS — R262 Difficulty in walking, not elsewhere classified: Secondary | ICD-10-CM | POA: Diagnosis not present

## 2014-04-10 DIAGNOSIS — M25562 Pain in left knee: Secondary | ICD-10-CM | POA: Diagnosis not present

## 2014-04-10 DIAGNOSIS — M25662 Stiffness of left knee, not elsewhere classified: Secondary | ICD-10-CM | POA: Diagnosis not present

## 2014-04-18 DIAGNOSIS — M170 Bilateral primary osteoarthritis of knee: Secondary | ICD-10-CM | POA: Diagnosis not present

## 2014-04-18 DIAGNOSIS — Z96651 Presence of right artificial knee joint: Secondary | ICD-10-CM | POA: Diagnosis not present

## 2014-06-22 ENCOUNTER — Other Ambulatory Visit: Payer: Self-pay

## 2014-09-10 DIAGNOSIS — I10 Essential (primary) hypertension: Secondary | ICD-10-CM | POA: Diagnosis not present

## 2014-09-10 DIAGNOSIS — G2581 Restless legs syndrome: Secondary | ICD-10-CM | POA: Diagnosis not present

## 2014-09-10 DIAGNOSIS — G250 Essential tremor: Secondary | ICD-10-CM | POA: Diagnosis not present

## 2014-09-12 DIAGNOSIS — Z96651 Presence of right artificial knee joint: Secondary | ICD-10-CM | POA: Diagnosis not present

## 2014-09-12 DIAGNOSIS — Z96652 Presence of left artificial knee joint: Secondary | ICD-10-CM | POA: Diagnosis not present

## 2014-09-12 DIAGNOSIS — M6752 Plica syndrome, left knee: Secondary | ICD-10-CM | POA: Diagnosis not present

## 2014-09-12 DIAGNOSIS — M6751 Plica syndrome, right knee: Secondary | ICD-10-CM | POA: Diagnosis not present

## 2014-10-07 DIAGNOSIS — Z23 Encounter for immunization: Secondary | ICD-10-CM | POA: Diagnosis not present

## 2014-10-27 ENCOUNTER — Encounter: Payer: Self-pay | Admitting: Behavioral Health

## 2014-10-27 ENCOUNTER — Telehealth: Payer: Self-pay | Admitting: Behavioral Health

## 2014-10-27 NOTE — Telephone Encounter (Signed)
Pre-Visit Call completed with patient and chart updated.   Pre-Visit Info documented in Specialty Comments under SnapShot.    

## 2014-10-28 ENCOUNTER — Encounter: Payer: Self-pay | Admitting: Family Medicine

## 2014-10-28 ENCOUNTER — Ambulatory Visit (INDEPENDENT_AMBULATORY_CARE_PROVIDER_SITE_OTHER): Payer: Medicare Other | Admitting: Family Medicine

## 2014-10-28 VITALS — BP 122/82 | HR 72 | Temp 97.6°F | Ht 61.0 in | Wt 239.2 lb

## 2014-10-28 DIAGNOSIS — M5442 Lumbago with sciatica, left side: Secondary | ICD-10-CM

## 2014-10-28 DIAGNOSIS — E782 Mixed hyperlipidemia: Secondary | ICD-10-CM

## 2014-10-28 DIAGNOSIS — I10 Essential (primary) hypertension: Secondary | ICD-10-CM

## 2014-10-28 DIAGNOSIS — M1990 Unspecified osteoarthritis, unspecified site: Secondary | ICD-10-CM | POA: Insufficient documentation

## 2014-10-28 DIAGNOSIS — M5441 Lumbago with sciatica, right side: Secondary | ICD-10-CM | POA: Diagnosis not present

## 2014-10-28 DIAGNOSIS — E669 Obesity, unspecified: Secondary | ICD-10-CM | POA: Diagnosis not present

## 2014-10-28 DIAGNOSIS — C50911 Malignant neoplasm of unspecified site of right female breast: Secondary | ICD-10-CM

## 2014-10-28 DIAGNOSIS — G250 Essential tremor: Secondary | ICD-10-CM | POA: Diagnosis not present

## 2014-10-28 DIAGNOSIS — K219 Gastro-esophageal reflux disease without esophagitis: Secondary | ICD-10-CM

## 2014-10-28 DIAGNOSIS — Z1239 Encounter for other screening for malignant neoplasm of breast: Secondary | ICD-10-CM

## 2014-10-28 DIAGNOSIS — Z8619 Personal history of other infectious and parasitic diseases: Secondary | ICD-10-CM | POA: Insufficient documentation

## 2014-10-28 DIAGNOSIS — E785 Hyperlipidemia, unspecified: Secondary | ICD-10-CM | POA: Insufficient documentation

## 2014-10-28 DIAGNOSIS — H04123 Dry eye syndrome of bilateral lacrimal glands: Secondary | ICD-10-CM | POA: Insufficient documentation

## 2014-10-28 DIAGNOSIS — J4540 Moderate persistent asthma, uncomplicated: Secondary | ICD-10-CM | POA: Insufficient documentation

## 2014-10-28 DIAGNOSIS — C801 Malignant (primary) neoplasm, unspecified: Secondary | ICD-10-CM | POA: Insufficient documentation

## 2014-10-28 HISTORY — DX: Mixed hyperlipidemia: E78.2

## 2014-10-28 HISTORY — DX: Essential (primary) hypertension: I10

## 2014-10-28 HISTORY — DX: Essential tremor: G25.0

## 2014-10-28 HISTORY — DX: Obesity, unspecified: E66.9

## 2014-10-28 MED ORDER — DILTIAZEM HCL ER BEADS 180 MG PO CP24: 180.0000 mg | ORAL_CAPSULE | Freq: Every day | ORAL | Status: DC

## 2014-10-28 MED ORDER — LANSOPRAZOLE 30 MG PO CPDR: 30.0000 mg | DELAYED_RELEASE_CAPSULE | Freq: Two times a day (BID) | ORAL | Status: DC

## 2014-10-28 MED ORDER — CICLESONIDE 50 MCG/ACT NA SUSP: 2.0000 | Freq: Every day | NASAL | Status: DC

## 2014-10-28 MED ORDER — TRIAMTERENE-HCTZ 37.5-25 MG PO CAPS: 1.0000 | ORAL_CAPSULE | Freq: Every day | ORAL | Status: DC

## 2014-10-28 NOTE — Patient Instructions (Signed)
DASH Eating Plan °DASH stands for "Dietary Approaches to Stop Hypertension." The DASH eating plan is a healthy eating plan that has been shown to reduce high blood pressure (hypertension). Additional health benefits may include reducing the risk of type 2 diabetes mellitus, heart disease, and stroke. The DASH eating plan may also help with weight loss. °WHAT DO I NEED TO KNOW ABOUT THE DASH EATING PLAN? °For the DASH eating plan, you will follow these general guidelines: °· Choose foods with a percent daily value for sodium of less than 5% (as listed on the food label). °· Use salt-free seasonings or herbs instead of table salt or sea salt. °· Check with your health care provider or pharmacist before using salt substitutes. °· Eat lower-sodium products, often labeled as "lower sodium" or "no salt added." °· Eat fresh foods. °· Eat more vegetables, fruits, and low-fat dairy products. °· Choose whole grains. Look for the word "whole" as the first word in the ingredient list. °· Choose fish and skinless chicken or turkey more often than red meat. Limit fish, poultry, and meat to 6 oz (170 g) each day. °· Limit sweets, desserts, sugars, and sugary drinks. °· Choose heart-healthy fats. °· Limit cheese to 1 oz (28 g) per day. °· Eat more home-cooked food and less restaurant, buffet, and fast food. °· Limit fried foods. °· Cook foods using methods other than frying. °· Limit canned vegetables. If you do use them, rinse them well to decrease the sodium. °· When eating at a restaurant, ask that your food be prepared with less salt, or no salt if possible. °WHAT FOODS CAN I EAT? °Seek help from a dietitian for individual calorie needs. °Grains °Whole grain or whole wheat bread. Brown rice. Whole grain or whole wheat pasta. Quinoa, bulgur, and whole grain cereals. Low-sodium cereals. Corn or whole wheat flour tortillas. Whole grain cornbread. Whole grain crackers. Low-sodium crackers. °Vegetables °Fresh or frozen vegetables  (raw, steamed, roasted, or grilled). Low-sodium or reduced-sodium tomato and vegetable juices. Low-sodium or reduced-sodium tomato sauce and paste. Low-sodium or reduced-sodium canned vegetables.  °Fruits °All fresh, canned (in natural juice), or frozen fruits. °Meat and Other Protein Products °Ground beef (85% or leaner), grass-fed beef, or beef trimmed of fat. Skinless chicken or turkey. Ground chicken or turkey. Pork trimmed of fat. All fish and seafood. Eggs. Dried beans, peas, or lentils. Unsalted nuts and seeds. Unsalted canned beans. °Dairy °Low-fat dairy products, such as skim or 1% milk, 2% or reduced-fat cheeses, low-fat ricotta or cottage cheese, or plain low-fat yogurt. Low-sodium or reduced-sodium cheeses. °Fats and Oils °Tub margarines without trans fats. Light or reduced-fat mayonnaise and salad dressings (reduced sodium). Avocado. Safflower, olive, or canola oils. Natural peanut or almond butter. °Other °Unsalted popcorn and pretzels. °The items listed above may not be a complete list of recommended foods or beverages. Contact your dietitian for more options. °WHAT FOODS ARE NOT RECOMMENDED? °Grains °White bread. White pasta. White rice. Refined cornbread. Bagels and croissants. Crackers that contain trans fat. °Vegetables °Creamed or fried vegetables. Vegetables in a cheese sauce. Regular canned vegetables. Regular canned tomato sauce and paste. Regular tomato and vegetable juices. °Fruits °Dried fruits. Canned fruit in light or heavy syrup. Fruit juice. °Meat and Other Protein Products °Fatty cuts of meat. Ribs, chicken wings, bacon, sausage, bologna, salami, chitterlings, fatback, hot dogs, bratwurst, and packaged luncheon meats. Salted nuts and seeds. Canned beans with salt. °Dairy °Whole or 2% milk, cream, half-and-half, and cream cheese. Whole-fat or sweetened yogurt. Full-fat   cheeses or blue cheese. Nondairy creamers and whipped toppings. Processed cheese, cheese spreads, or cheese  curds. °Condiments °Onion and garlic salt, seasoned salt, table salt, and sea salt. Canned and packaged gravies. Worcestershire sauce. Tartar sauce. Barbecue sauce. Teriyaki sauce. Soy sauce, including reduced sodium. Steak sauce. Fish sauce. Oyster sauce. Cocktail sauce. Horseradish. Ketchup and mustard. Meat flavorings and tenderizers. Bouillon cubes. Hot sauce. Tabasco sauce. Marinades. Taco seasonings. Relishes. °Fats and Oils °Butter, stick margarine, lard, shortening, ghee, and bacon fat. Coconut, palm kernel, or palm oils. Regular salad dressings. °Other °Pickles and olives. Salted popcorn and pretzels. °The items listed above may not be a complete list of foods and beverages to avoid. Contact your dietitian for more information. °WHERE CAN I FIND MORE INFORMATION? °National Heart, Lung, and [employer] Institute: www.nhlbi.nih.gov/health/health-topics/topics/dash/ °Document Released: 01/13/2011 Document Revised: 06/10/2013 Document Reviewed: 11/28/2012 °ExitCare® Patient Information ©2015 [employer]. This information is not intended to replace advice given to you by your health care provider. Make sure you discuss any questions you have with your health care provider. ° °

## 2014-10-29 ENCOUNTER — Telehealth: Payer: Self-pay | Admitting: Family Medicine

## 2014-10-29 LAB — CBC
HEMATOCRIT: 43.4 % (ref 36.0–46.0)
Hemoglobin: 14.5 g/dL (ref 12.0–15.0)
MCHC: 33.3 g/dL (ref 30.0–36.0)
MCV: 90.9 fl (ref 78.0–100.0)
Platelets: 386 10*3/uL (ref 150.0–400.0)
RBC: 4.78 Mil/uL (ref 3.87–5.11)
RDW: 14.2 % (ref 11.5–15.5)
WBC: 10.4 10*3/uL (ref 4.0–10.5)

## 2014-10-29 LAB — COMPREHENSIVE METABOLIC PANEL
ALT: 12 U/L (ref 0–35)
AST: 19 U/L (ref 0–37)
Albumin: 4.1 g/dL (ref 3.5–5.2)
Alkaline Phosphatase: 81 U/L (ref 39–117)
BUN: 20 mg/dL (ref 6–23)
CALCIUM: 9.9 mg/dL (ref 8.4–10.5)
CHLORIDE: 102 meq/L (ref 96–112)
CO2: 29 meq/L (ref 19–32)
CREATININE: 0.95 mg/dL (ref 0.40–1.20)
GFR: 60.97 mL/min (ref >60.00)
Glucose, Bld: 90 mg/dL (ref 70–99)
POTASSIUM: 4.1 meq/L (ref 3.5–5.1)
SODIUM: 140 meq/L (ref 135–145)
Total Bilirubin: 0.3 mg/dL (ref 0.2–1.2)
Total Protein: 7.7 g/dL (ref 6.0–8.3)

## 2014-10-29 LAB — LIPID PANEL
CHOL/HDL RATIO: 3
Cholesterol: 220 mg/dL — ABNORMAL HIGH (ref 0–200)
HDL: 63.1 mg/dL (ref >39.00)
LDL CALC: 120 mg/dL — AB (ref 0–99)
NONHDL: 156.42
Triglycerides: 182 mg/dL — ABNORMAL HIGH (ref 0.0–149.0)
VLDL: 36.4 mg/dL (ref 0.0–40.0)

## 2014-10-29 LAB — TSH: TSH: 3.12 u[IU]/mL (ref 0.35–4.50)

## 2014-10-29 NOTE — Telephone Encounter (Signed)
Caller name: Nafisah Runions  Relationship to patient: Self   Can be reached: [phone]  Pharmacy:  Reason for call: pt called in requesting a refill on  sympicort  (inhaler) dosage 80/ 4.5.

## 2014-10-29 NOTE — Telephone Encounter (Signed)
Pt just established with Dr Charlett Blake on 10/28/14.  Please advise if you will manage symbicort refills?

## 2014-10-29 NOTE — Telephone Encounter (Signed)
Yes I am willing to write her Symbicort just confirm sig. Make sure she knows we do not have samples though

## 2014-10-30 MED ORDER — BUDESONIDE-FORMOTEROL FUMARATE 80-4.5 MCG/ACT IN AERO: 2.0000 | INHALATION_SPRAY | Freq: Two times a day (BID) | RESPIRATORY_TRACT | Status: DC

## 2014-10-30 NOTE — Telephone Encounter (Signed)
Prescription sent in  

## 2014-11-02 ENCOUNTER — Encounter: Payer: Self-pay | Admitting: Family Medicine

## 2014-11-02 DIAGNOSIS — O99891 Other specified diseases and conditions complicating pregnancy: Secondary | ICD-10-CM

## 2014-11-02 DIAGNOSIS — M545 Low back pain, unspecified: Secondary | ICD-10-CM

## 2014-11-02 DIAGNOSIS — M549 Dorsalgia, unspecified: Secondary | ICD-10-CM

## 2014-11-02 HISTORY — DX: Dorsalgia, unspecified: M54.9

## 2014-11-02 HISTORY — DX: Other specified diseases and conditions complicating pregnancy: O99.891

## 2014-11-02 HISTORY — DX: Low back pain, unspecified: M54.50

## 2014-11-02 NOTE — Progress Notes (Signed)
Subjective:    Patient ID: Amanda Copeland, female    DOB: 1941-01-15, 74 y.o.   MRN: [id number]  Chief Complaint  Patient presents with  . Establish Care    HPI Patient is in today for new patient appointment. She is new to the area. She is requesting some referrals. She has a benign essential tremor managed with propranolol but she would like to establish with neurologist and request Dr. Posey Pronto. She also has a history of hypertension and hyperlipidemia and request a referral to cardiology Dr. Illene Bolus. She has a distant history of breast cancer in the right breast requiring mastectomy in 1985 did undergo chemotherapy at that time. Has done well since then. Also struggles with chronic back pain and is requesting referral to orthopedics for further management. Her final complaint today is of some crinkling sound in her ears. No decreased hearing or drainage. No fevers or congestion. Denies CP/palp/SOB/HA/congestion/fevers/GI or GU c/o. Taking meds as prescribed  Past Medical History  Diagnosis Date  . Dry eyes   . Benign essential tremor   . Bilateral dry eyes   . Asthma     childhood now returning  . Arthritis   . Cancer     Breast  . Obesity 10/28/2014  . Mixed hyperlipidemia 10/28/2014  . History of chicken pox   . H/O measles   . H/O mumps   . Benign essential tremor 10/28/2014  . Back pain affecting pregnancy 11/02/2014  . Lumbago 11/02/2014    Past Surgical History  Procedure Laterality Date  . Mastectomy Right   . Cholecystectomy    . Back surgery    . Rotator cuff repair    . Replacement total knee bilateral    . Breast reduction surgery Left   . Cholecystectomy    . Eye surgery  2008    b/l cataracts removed, in [facility]    Family History  Problem Relation Age of Onset  . Heart disease Mother   . Cancer Father     colon  . Asthma Father   . Parkinson's disease Father   . Cancer Maternal Grandmother     uterine  . Heart disease Maternal Grandfather   . Obesity  Daughter   . Appendicitis Paternal Grandmother     Social History   Social History  . Marital Status: Married    Spouse Name: N/A  . Number of Children: N/A  . Years of Education: N/A   Occupational History  . retired    Social History Main Topics  . Smoking status: Never Smoker   . Smokeless tobacco: Not on file  . Alcohol Use: 0.0 oz/week    0 Standard drinks or equivalent per week     Comment: Not often  . Drug Use: No  . Sexual Activity: Not on file     Comment: lives with husband, moved [place], no dietary restrictions, retired Education officer, museum   Other Topics Concern  . Not on file   Social History Narrative    Outpatient Prescriptions Prior to Visit  Medication Sig Dispense Refill  . aspirin 81 MG tablet Take 81 mg by mouth daily.    . cycloSPORINE (RESTASIS) 0.05 % ophthalmic emulsion Place 1 drop into both eyes daily.    . Multiple Vitamin (MULTIVITAMIN) tablet Take 1 tablet by mouth daily.    . propranolol (INDERAL) 20 MG tablet Take 20 mg by mouth 2 (two) times daily.    . Red Yeast Rice Extract (RED YEAST RICE PO)  Take by mouth daily.    . Vit C-Quercet-Bioflv-Bromelain (VITAMIN C-QUERCETIN-CITRUS BIO PO) Take 1 tablet by mouth daily.    . Budesonide-Formoterol Fumarate (SYMBICORT IN) Inhale into the lungs.    . ciclesonide (OMNARIS) 50 MCG/ACT nasal spray Place 2 sprays into both nostrils daily.    Marland Kitchen diltiazem (TIAZAC) 180 MG 24 hr capsule Take 180 mg by mouth daily.    . lansoprazole (PREVACID) 30 MG capsule Take 30 mg by mouth 2 (two) times daily.    Marland Kitchen triamterene-hydrochlorothiazide (DYAZIDE) 37.5-25 MG per capsule Take 1 capsule by mouth daily.     No facility-administered medications prior to visit.    Allergies  Allergen Reactions  . Statins Other (See Comments)    Per reports muscle aches and joint pain  . Amoxicillin Rash  . Ampicillin Rash  . Penicillins Rash    Review of Systems  Constitutional: Negative for fever, chills and malaise/fatigue.    HENT: Negative for congestion and hearing loss.   Eyes: Negative for discharge.  Respiratory: Negative for cough, sputum production and shortness of breath.   Cardiovascular: Negative for chest pain, palpitations and leg swelling.  Gastrointestinal: Negative for heartburn, nausea, vomiting, abdominal pain, diarrhea, constipation and blood in stool.  Genitourinary: Negative for dysuria, urgency, frequency and hematuria.  Musculoskeletal: Positive for back pain. Negative for myalgias and falls.  Skin: Negative for rash.  Neurological: Negative for dizziness, sensory change, loss of consciousness, weakness and headaches.  Endo/Heme/Allergies: Negative for environmental allergies. Does not bruise/bleed easily.  Psychiatric/Behavioral: Negative for depression and suicidal ideas. The patient is not nervous/anxious and does not have insomnia.        Objective:    Physical Exam  Constitutional: She is oriented to person, place, and time. She appears well-developed and well-nourished. No distress.  HENT:  Head: Normocephalic and atraumatic.  Eyes: Conjunctivae are normal.  Neck: Neck supple. No thyromegaly present.  Cardiovascular: Normal rate, regular rhythm and normal heart sounds.   No murmur heard. Pulmonary/Chest: Effort normal and breath sounds normal. No respiratory distress.  Abdominal: Soft. Bowel sounds are normal. She exhibits no distension and no mass. There is no tenderness.  Musculoskeletal: She exhibits no edema.  Lymphadenopathy:    She has no cervical adenopathy.  Neurological: She is alert and oriented to person, place, and time.  Skin: Skin is warm and dry.  Psychiatric: She has a normal mood and affect. Her behavior is normal.    BP 122/82 mmHg  Pulse 72  Temp(Src) 97.6 F (36.4 C) (Oral)  Ht 5\' 1"  (1.549 m)  Wt 239 lb 4 oz (108.523 kg)  BMI 45.23 kg/m2  SpO2 95% Wt Readings from Last 3 Encounters:  10/28/14 239 lb 4 oz (108.523 kg)     Lab Results   Component Value Date   WBC 10.4 10/28/2014   HGB 14.5 10/28/2014   HCT 43.4 10/28/2014   PLT 386.0 10/28/2014   GLUCOSE 90 10/28/2014   CHOL 220* 10/28/2014   TRIG 182.0* 10/28/2014   HDL 63.10 10/28/2014   LDLCALC 120* 10/28/2014   ALT 12 10/28/2014   AST 19 10/28/2014   NA 140 10/28/2014   K 4.1 10/28/2014   CL 102 10/28/2014   CREATININE 0.95 10/28/2014   BUN 20 10/28/2014   CO2 29 10/28/2014   TSH 3.12 10/28/2014    Lab Results  Component Value Date   TSH 3.12 10/28/2014   Lab Results  Component Value Date   WBC 10.4 10/28/2014   HGB  14.5 10/28/2014   HCT 43.4 10/28/2014   MCV 90.9 10/28/2014   PLT 386.0 10/28/2014   Lab Results  Component Value Date   NA 140 10/28/2014   K 4.1 10/28/2014   CO2 29 10/28/2014   GLUCOSE 90 10/28/2014   BUN 20 10/28/2014   CREATININE 0.95 10/28/2014   BILITOT 0.3 10/28/2014   ALKPHOS 81 10/28/2014   AST 19 10/28/2014   ALT 12 10/28/2014   PROT 7.7 10/28/2014   ALBUMIN 4.1 10/28/2014   CALCIUM 9.9 10/28/2014   GFR 60.97 10/28/2014   Lab Results  Component Value Date   CHOL 220* 10/28/2014   Lab Results  Component Value Date   HDL 63.10 10/28/2014   Lab Results  Component Value Date   LDLCALC 120* 10/28/2014   Lab Results  Component Value Date   TRIG 182.0* 10/28/2014   Lab Results  Component Value Date   CHOLHDL 3 10/28/2014   No results found for: HGBA1C     Assessment & Plan:   Problem List Items Addressed This Visit    Obesity - Primary    Encouraged DASH diet, decrease po intake and increase exercise as tolerated. Needs 7-8 hours of sleep nightly. Avoid trans fats, eat small, frequent meals every 4-5 hours with lean proteins, complex carbs and healthy fats. Minimize simple carbs, GMO foods.      Relevant Orders   TSH (Completed)   CBC (Completed)   Comprehensive metabolic panel (Completed)   Lipid panel (Completed)   MM Digital Screening Unilat L   Mixed hyperlipidemia    Encouraged heart  healthy diet, increase exercise, avoid trans fats, consider a krill oil cap daily      Relevant Medications   triamterene-hydrochlorothiazide (DYAZIDE) 37.5-25 MG per capsule   diltiazem (TIAZAC) 180 MG 24 hr capsule   Other Relevant Orders   Ambulatory referral to Cardiology   Lumbago    Referred to ortho for further management. Encouraged moist heat and gentle stretching as tolerated. May try NSAIDs and prescription meds as directed and report if symptoms worsen or seek immediate care      Relevant Orders   Ambulatory referral to Orthopedic Surgery   History of chicken pox    Took Zostavax in 2014      Essential hypertension    Well controlled, no changes to meds. Encouraged heart healthy diet such as the DASH diet and exercise as tolerated. Patient requests referral to Dr Irish Lack      Relevant Medications   triamterene-hydrochlorothiazide (DYAZIDE) 37.5-25 MG per capsule   diltiazem (TIAZAC) 180 MG 24 hr capsule   Other Relevant Orders   TSH (Completed)   CBC (Completed)   Comprehensive metabolic panel (Completed)   Lipid panel (Completed)   MM Digital Screening Unilat L   Ambulatory referral to Cardiology   Esophageal reflux    Avoid offending foods, start probiotics. Do not eat large meals in late evening and consider raising head of bed.       Relevant Medications   lansoprazole (PREVACID) 30 MG capsule   Other Relevant Orders   TSH (Completed)   CBC (Completed)   Comprehensive metabolic panel (Completed)   Lipid panel (Completed)   MM Digital Screening Unilat L   Benign essential tremor    Good response to propranolol. New to the area. Asks for referral to neurology to monitor      Relevant Orders   Ambulatory referral to Neurology    Other Visit Diagnoses    Breast  cancer, right        Relevant Orders    TSH (Completed)    CBC (Completed)    Comprehensive metabolic panel (Completed)    Lipid panel (Completed)    MM Digital Screening Unilat L     Breast cancer screening        Relevant Orders    MM Digital Screening Unilat L       I have changed Ms. Caridi triamterene-hydrochlorothiazide, lansoprazole, and diltiazem. I am also having her maintain her propranolol, cycloSPORINE, multivitamin, Vit C-Quercet-Bioflv-Bromelain (VITAMIN C-QUERCETIN-CITRUS BIO PO), Red Yeast Rice Extract (RED YEAST RICE PO), aspirin, and ciclesonide.  Meds ordered this encounter  Medications  . triamterene-hydrochlorothiazide (DYAZIDE) 37.5-25 MG per capsule    Sig: Take 1 each (1 capsule total) by mouth daily.    Dispense:  90 capsule    Refill:  2  . lansoprazole (PREVACID) 30 MG capsule    Sig: Take 1 capsule (30 mg total) by mouth 2 (two) times daily.    Dispense:  60 capsule    Refill:  6  . diltiazem (TIAZAC) 180 MG 24 hr capsule    Sig: Take 1 capsule (180 mg total) by mouth daily.    Dispense:  90 capsule    Refill:  2  . ciclesonide (OMNARIS) 50 MCG/ACT nasal spray    Sig: Place 2 sprays into both nostrils daily.    Dispense:  12.5 g    Refill:  6     [name], MD

## 2014-11-02 NOTE — Assessment & Plan Note (Signed)
Good response to propranolol. New to the area. Asks for referral to neurology to monitor

## 2014-11-02 NOTE — Assessment & Plan Note (Signed)
Took Zostavax in 2014

## 2014-11-02 NOTE — Assessment & Plan Note (Signed)
Referred to ortho for further management. Encouraged moist heat and gentle stretching as tolerated. May try NSAIDs and prescription meds as directed and report if symptoms worsen or seek immediate care

## 2014-11-02 NOTE — Assessment & Plan Note (Signed)
Avoid offending foods, start probiotics. Do not eat large meals in late evening and consider raising head of bed.  

## 2014-11-02 NOTE — Assessment & Plan Note (Addendum)
Well controlled, no changes to meds. Encouraged heart healthy diet such as the DASH diet and exercise as tolerated. Patient requests referral to Dr Irish Lack

## 2014-11-02 NOTE — Assessment & Plan Note (Signed)
Encouraged heart healthy diet, increase exercise, avoid trans fats, consider a krill oil cap daily 

## 2014-11-02 NOTE — Assessment & Plan Note (Signed)
Encouraged DASH diet, decrease po intake and increase exercise as tolerated. Needs 7-8 hours of sleep nightly. Avoid trans fats, eat small, frequent meals every 4-5 hours with lean proteins, complex carbs and healthy fats. Minimize simple carbs, GMO foods. 

## 2014-11-25 ENCOUNTER — Encounter: Payer: Self-pay | Admitting: Interventional Cardiology

## 2014-11-25 ENCOUNTER — Ambulatory Visit (INDEPENDENT_AMBULATORY_CARE_PROVIDER_SITE_OTHER): Payer: Medicare Other | Admitting: Interventional Cardiology

## 2014-11-25 VITALS — BP 128/80 | HR 57 | Ht 61.0 in | Wt 240.0 lb

## 2014-11-25 DIAGNOSIS — I10 Essential (primary) hypertension: Secondary | ICD-10-CM

## 2014-11-25 DIAGNOSIS — E782 Mixed hyperlipidemia: Secondary | ICD-10-CM | POA: Diagnosis not present

## 2014-11-25 NOTE — Progress Notes (Signed)
Patient ID: Amanda Copeland, female   DOB: 17-Jun-1940, 74 y.o.   MRN: [id number]     Cardiology Office Note   Date:  11/25/2014   ID:  Milanna Kozlov, DOB 04/11/1940, MRN [id number]  PCP:  [name], MD    No chief complaint on file. cardiology evaluation   Wt Readings from Last 3 Encounters:  11/25/14 240 lb (108.863 kg)  10/28/14 239 lb 4 oz (108.523 kg)       History of Present Illness: Amanda Copeland is a 73 y.o. female  Who has RF for CAD.  SHe has had a stress test several years ago that was normal.  She has some DOE.  Se has had some wheezing.  Her PMD put her back on some inhalers.  THis has helped.  SHOB is decreased.  DOE is not an everyday thing.    She is intolerant to statins to muscle pain and fatigue.  SHe took atorvastatin.  She also tried another, maybe pravastatin but did not tolerate this.   It was recommended that she follow the DASH diet for weight loss.   She exercised more when she was living in [place].  She is trying to get back into a regular routine.    Past Medical History  Diagnosis Date  . Dry eyes   . Benign essential tremor   . Bilateral dry eyes   . Asthma     childhood now returning  . Arthritis   . Cancer ([place])     Breast  . Obesity 10/28/2014  . Mixed hyperlipidemia 10/28/2014  . History of chicken pox   . H/O measles   . H/O mumps   . Benign essential tremor 10/28/2014  . Back pain affecting pregnancy 11/02/2014  . Lumbago 11/02/2014    Past Surgical History  Procedure Laterality Date  . Mastectomy Right   . Cholecystectomy    . Back surgery    . Rotator cuff repair    . Replacement total knee bilateral    . Breast reduction surgery Left   . Cholecystectomy    . Eye surgery  2008    b/l cataracts removed, in [facility]     Current Outpatient Prescriptions  Medication Sig Dispense Refill  . aspirin 81 MG tablet Take 81 mg by mouth daily.    . budesonide-formoterol (SYMBICORT) 80-4.5 MCG/ACT inhaler Inhale 2 puffs into the  lungs 2 (two) times daily. 1 Inhaler 11  . ciclesonide (OMNARIS) 50 MCG/ACT nasal spray Place 2 sprays into both nostrils daily. 12.5 g 6  . cycloSPORINE (RESTASIS) 0.05 % ophthalmic emulsion Place 1 drop into both eyes daily.    Marland Kitchen diltiazem (TIAZAC) 180 MG 24 hr capsule Take 1 capsule (180 mg total) by mouth daily. 90 capsule 2  . lansoprazole (PREVACID) 30 MG capsule Take 1 capsule (30 mg total) by mouth 2 (two) times daily. 60 capsule 6  . Multiple Vitamin (MULTIVITAMIN) tablet Take 1 tablet by mouth daily.    . propranolol (INDERAL) 20 MG tablet Take 20 mg by mouth 2 (two) times daily.    . Red Yeast Rice Extract (RED YEAST RICE PO) Take by mouth daily.    Marland Kitchen triamterene-hydrochlorothiazide (DYAZIDE) 37.5-25 MG per capsule Take 1 each (1 capsule total) by mouth daily. 90 capsule 2  . Vit C-Quercet-Bioflv-Bromelain (VITAMIN C-QUERCETIN-CITRUS BIO PO) Take 1 tablet by mouth daily.     No current facility-administered medications for this visit.    Allergies:   Statins; Amoxicillin; Ampicillin; and Penicillins  Social History:  The patient  reports that she has never smoked. She does not have any smokeless tobacco history on file. She reports that she drinks alcohol. She reports that she does not use illicit drugs.   Family History:  The patient's family history includes Appendicitis in her paternal grandmother; Asthma in her father; Cancer in her father and maternal grandmother; Heart attack in her mother; Heart disease in her maternal grandfather and mother; Heart failure in her mother; Hypertension in her mother; Obesity in her daughter; Parkinson's disease in her father. There is no history of Stroke.    ROS:  Please see the history of present illness.   Otherwise, review of systems are positive for improving SHOB.   All other systems are reviewed and negative.    PHYSICAL EXAM: VS:  BP 128/80 mmHg  Pulse 57  Ht 5\' 1"  (1.549 m)  Wt 240 lb (108.863 kg)  BMI 45.37 kg/m2 , BMI Body  mass index is 45.37 kg/(m^2). GEN: Well nourished, well developed, in no acute distress HEENT: normal Neck: no JVD, carotid bruits, or masses Cardiac: RRR; no murmurs, rubs, or gallops,no edema  Respiratory:  clear to auscultation bilaterally, normal work of breathing GI: soft, nontender, nondistended, + BS MS: no deformity or atrophy Skin: warm and dry, no rash Neuro:  Strength and sensation are intact Psych: euthymic mood, full affect   EKG:   The ekg ordered today demonstrates SB, no ST segment changes   Recent Labs: 10/28/2014: ALT 12; BUN 20; Creatinine, Ser 0.95; Hemoglobin 14.5; Platelets 386.0; Potassium 4.1; Sodium 140; TSH 3.12   Lipid Panel    Component Value Date/Time   CHOL 220* 10/28/2014 1450   TRIG 182.0* 10/28/2014 1450   HDL 63.10 10/28/2014 1450   CHOLHDL 3 10/28/2014 1450   VLDL 36.4 10/28/2014 1450   LDLCALC 120* 10/28/2014 1450     Other studies Reviewed: Additional studies/ records that were reviewed today with results demonstrating: no records available from prior stress test.   ASSESSMENT AND PLAN:  1. SHOB: Improved with inhalers.  No ischemia w/u at this time.  SHe will let us know if sx get worse.   2. HTN: Controlled.  Continue lifestyle modifications including more exercise and dietary changes to help lose weight. 3. Hypercholesterolemia: Statin intolerant.  Taking red yeast rice.   Current medicines are reviewed at length with the patient today.  The patient concerns regarding her medicines were addressed.  The following changes have been made:  No change  Labs/ tests ordered today include:  No orders of the defined types were placed in this encounter.    Recommend 150 minutes/week of aerobic exercise Low fat, low carb, high fiber diet recommended  Disposition:   FU in prn   Teresita Madura., MD  11/25/2014 3:53 PM    [facility] [address], [place], [place]  [address] Phone: [phone];  Fax: [phone]

## 2014-11-25 NOTE — Patient Instructions (Signed)
Medication Instructions:  Same-no changes  Labwork: None  Testing/Procedures: None  Follow-Up: Your physician recommends that you schedule a follow-up appointment in: as needed       

## 2014-12-02 ENCOUNTER — Telehealth: Payer: Self-pay | Admitting: Interventional Cardiology

## 2014-12-02 NOTE — Telephone Encounter (Signed)
ROI faxed to [facility] records received back placed in chart prep bin.

## 2014-12-02 NOTE — Addendum Note (Signed)
Addended by: Freada Bergeron on: 12/02/2014 02:25 PM   Modules accepted: Orders

## 2014-12-11 ENCOUNTER — Telehealth: Payer: Self-pay | Admitting: Interventional Cardiology

## 2014-12-11 DIAGNOSIS — I6529 Occlusion and stenosis of unspecified carotid artery: Secondary | ICD-10-CM

## 2014-12-11 NOTE — Telephone Encounter (Signed)
Carotid doppler from 11/2013 sent over from [facility]. Dr. Irish Lack requested that pt have f/u carotid dopplers completed.  Spoke with pt and made her aware and pt is agreeable to have carotid dopplers completed. Advised pt that [place] scheduler will call her to schedule. Pt states that she has appt with ortho over by [facility] office tomorrow and would like to see if there was any availability around the time of that appt which is 8:30AM. Advised pt that I will send this message to [facility] scheduler to make her aware. Pt verbalized understanding and was in agreement with this plan.

## 2014-12-12 ENCOUNTER — Ambulatory Visit (HOSPITAL_COMMUNITY)
Admission: RE | Admit: 2014-12-12 | Discharge: 2014-12-12 | Disposition: A | Discharge status: Home / Self Care | Payer: Medicare Other | Source: Ambulatory Visit | Attending: Interventional Cardiology | Admitting: Interventional Cardiology

## 2014-12-12 DIAGNOSIS — I6529 Occlusion and stenosis of unspecified carotid artery: Secondary | ICD-10-CM | POA: Diagnosis not present

## 2014-12-12 DIAGNOSIS — M545 Low back pain: Secondary | ICD-10-CM | POA: Diagnosis not present

## 2014-12-12 DIAGNOSIS — M25562 Pain in left knee: Secondary | ICD-10-CM | POA: Diagnosis not present

## 2014-12-12 DIAGNOSIS — M6281 Muscle weakness (generalized): Secondary | ICD-10-CM | POA: Diagnosis not present

## 2014-12-12 DIAGNOSIS — E785 Hyperlipidemia, unspecified: Secondary | ICD-10-CM | POA: Insufficient documentation

## 2014-12-12 DIAGNOSIS — M4316 Spondylolisthesis, lumbar region: Secondary | ICD-10-CM | POA: Diagnosis not present

## 2014-12-12 DIAGNOSIS — I6522 Occlusion and stenosis of left carotid artery: Secondary | ICD-10-CM | POA: Diagnosis not present

## 2014-12-12 DIAGNOSIS — M25561 Pain in right knee: Secondary | ICD-10-CM | POA: Diagnosis not present

## 2014-12-24 ENCOUNTER — Ambulatory Visit: Payer: Medicare Other | Attending: Orthopedic Surgery | Admitting: Rehabilitative and Restorative Service Providers"

## 2014-12-24 DIAGNOSIS — Z7409 Other reduced mobility: Secondary | ICD-10-CM | POA: Insufficient documentation

## 2014-12-24 DIAGNOSIS — R6889 Other general symptoms and signs: Secondary | ICD-10-CM

## 2014-12-24 DIAGNOSIS — R269 Unspecified abnormalities of gait and mobility: Secondary | ICD-10-CM | POA: Diagnosis not present

## 2014-12-24 DIAGNOSIS — R29898 Other symptoms and signs involving the musculoskeletal system: Secondary | ICD-10-CM | POA: Insufficient documentation

## 2014-12-24 DIAGNOSIS — R531 Weakness: Secondary | ICD-10-CM

## 2014-12-24 NOTE — Patient Instructions (Signed)
Gluteal Sets    Tighten buttocks while pressing pelvis to floor. Hold _10___ seconds. Repeat __10__ times per set. Do _1-3 ___ sets per session. Do __1__ sessions per day. Can do lying on back and standing    (Home) Extension: Hip    With support under abdomen, tighten stomach. Lift right leg in line with body. Do not hyperextend. Alternate legs. Repeat _10___ times per set. Do _1-2___ sets per session. Do __1__ sessions per day   (Home) Extension: Caudal Unilateral Hip - Prone    Lean across table on elbows. Lift one leg. Do not lift it higher than trunk. Repeat __10__ times per set. Do _1-3___ sets per session. Do _1___ sessions per day  Functional Quadriceps: Sit to Stand    Sit on edge of chair, feet flat on floor. Stand upright, extending knees fully. Repeat _5___ times per set. Do _1-2___ sets per session. Do _2-3___ sessions per day.   Back Wall Slide    With feet _8-10 inches___ inches from wall, lean as much of back against the wall as possible. Gently squat down _2__ inches, keeping back against wall. Hold _10-30___ seconds while counting out loud. Repeat __5-10__ times. Do _2-3___ sessions per day.   Standing without locking knees. Start with 1 min and work toward 3-5 min  Several times each day  Rolling pin along thighs

## 2014-12-24 NOTE — Therapy (Signed)
[facility] [address]  [place], [place], [address] Phone: [phone]   Fax:  [phone]  Physical Therapy Evaluation  Patient Details  Name: Amanda Copeland MRN: [id number] Date of Birth: 28-Mar-1940 Referring Provider: Dr. Paralee Cancel   Encounter Date: 12/24/2014      PT End of Session - 12/24/14 1308    Visit Number 1   Number of Visits 12   Date for PT Re-Evaluation 02/04/15   PT Start Time [id number]   PT Stop Time 1356   PT Time Calculation (min) 48 min   Activity Tolerance Patient tolerated treatment well      Past Medical History  Diagnosis Date  . Dry eyes   . Benign essential tremor   . Bilateral dry eyes   . Asthma     childhood now returning  . Arthritis   . Cancer ([place])     Breast  . Obesity 10/28/2014  . Mixed hyperlipidemia 10/28/2014  . History of chicken pox   . H/O measles   . H/O mumps   . Benign essential tremor 10/28/2014  . Back pain affecting pregnancy 11/02/2014  . Lumbago 11/02/2014    Past Surgical History  Procedure Laterality Date  . Mastectomy Right   . Cholecystectomy    . Back surgery    . Rotator cuff repair    . Replacement total knee bilateral    . Breast reduction surgery Left   . Cholecystectomy    . Eye surgery  2008    b/l cataracts removed, in [facility]    There were no vitals filed for this visit.  Visit Diagnosis:  Decreased strength, endurance, and mobility - Plan: PT plan of care cert/re-cert  Weakness of both legs - Plan: PT plan of care cert/re-cert  Abnormal gait - Plan: PT plan of care cert/re-cert      Subjective Assessment - 12/24/14 1311    Subjective Patient reports that her legs feel weak and wobbly even after a short walk. She has noticed less strength over the last 6-8 months. She has had B TKA Rt 01/15; Lt 08/15; Rt mastectomy ~30 yr ago    Pertinent History Arthritis multiple joints; lumbar disc surgery ~30 yr ago   How long can you sit  comfortably? no limit   How long can you stand comfortably? 10 min    How long can you walk comfortably? 2-5 min notices fatigue but can continue walking ~10 min    Diagnostic tests xrays knees and spine    Patient Stated Goals Walk better and further without getting wobbly    Currently in Pain? No/denies            [facility] PT Assessment - 12/24/14 0001    Assessment   Medical Diagnosis LE weakness/LBP    Referring Provider Dr. Paralee Cancel    Onset Date/Surgical Date --  bilat TKA 2015   Hand Dominance Right   Next MD Visit no return scheduled    Prior Therapy for TKA last year    Precautions   Precautions None   Balance Screen   Has the patient fallen in the past 6 months Yes   How many times? 1   Has the patient had a decrease in activity level because of a fear of falling?  No   Is the patient reluctant to leave their home because of a fear of falling?  No   Home Environment   Additional Comments  level entry inot single level home - has difficulty ascending and descending stairs    Prior Function   Level of Independence Independent   Vocation Retired   Leisure some household chores; water aerobics 2x/wk - otherwise sedentary    Observation/Other Assessments   Focus on Therapeutic Outcomes (FOTO)  50% limitation    Sensation   Additional Comments numbness Lt lateral thigh and posterior knee area; Rt lateral calf    Posture/Postural Control   Posture Comments head forward; shoulders rounded and elevated; increased thoracic kyphosis; flexed forward at hips; LE's ER Lt > Rt; knees hyperextended    AROM   Overall AROM Comments ROM ~ 90 deg hip flex; neutral ext; knee flex 115 deg bilat    Strength   Right Hip Flexion 4+/5   Right Hip Extension 3/5   Right Hip ABduction 4/5   Right Hip ADduction 4+/5   Left Hip Flexion 4-/5   Left Hip Extension 3-/5   Left Hip ABduction 3/5   Left Hip ADduction 3+/5   Palpation   Spinal mobility painful w/CPA mobs sacrum/L5     Ambulation/Gait   Gait Comments flexed forward posture; LE's ER; decresaed arm swing; decreased Wt bearing phase Lt compared to Rt    Balance   Balance Assessed --  SLS Rt 4 sec; Lt 2 sec no hand hold assist    Functional Gait  Assessment   Gait assessed  --  TUG 10 ft 7 sec                    [facility] Adult PT Treatment/Exercise - 12/24/14 0001    Therapeutic Activites    Therapeutic Activities --  pt to try rolling pin for mm tightness bilat LE's    Neuro Re-ed    Neuro Re-ed Details  worked on upright posture and alignment   Knee/Hip Exercises: Standing   Wall Squat Limitations very shallow knee bend ~ 2-3 inches 10 sec hold x 5 x 2 sets avoiding knee pain    Knee/Hip Exercises: Seated   Sit to Sand 5 reps;without UE support  2 sets   Knee/Hip Exercises: Prone   Straight Leg Raises Right;Left;1 set;5 reps  difficulty w/ some LBP with ex - stopped at 5 will modify   Other Prone Exercises glut sets 10 sec x 10                 PT Education - 12/24/14 1412    Education provided Yes   Education Details postural correction; avoid standing with knees hyperextended; HEP    Person(s) Educated Patient   Methods Explanation;Demonstration;Tactile cues;Verbal cues;Handout   Comprehension Verbalized understanding;Returned demonstration;Verbal cues required;Tactile cues required             PT Long Term Goals - 12/24/14 1738    PT LONG TERM GOAL #1   Title Tolerante standing for 15 min 02/04/15   Time 6   Period Weeks   Status New   PT LONG TERM GOAL #2   Title Walk in community for functional distances for 10-15 min 02/04/15   Time 6   Period Weeks   Status New   PT LONG TERM GOAL #3   Title Improve activity level with pt tolerating 30-40 min of exercise 2-4 days/wk 02/04/15   Time 6   Period Weeks   Status New   PT LONG TERM GOAL #4   Title Independent in HEP for discharge 02/04/15   Time 6  Period Weeks   Status New   PT LONG TERM GOAL #5    Title Improve FOTO to </= 44% limitation 02/04/15   Time 6   Period Weeks   Status New               Plan - December 25, 2014 1734    Clinical Impression Statement Pt presents with bilat LE weakness limiting functional activity level and independence. She has bilat LE weakness; limited ROM/mobility; difficulty with gait; decreased endurance. Pt will benefit form PT to address problems identified and improve functional activity level including endurance for gait and ADL's thus gaining maximum rehab potential.    Pt will benefit from skilled therapeutic intervention in order to improve on the following deficits Postural dysfunction;Abnormal gait;Decreased activity tolerance;Decreased endurance;Decreased range of motion;Decreased strength   Rehab Potential Good   PT Frequency 2x / week   PT Duration 6 weeks   PT Treatment/Interventions Patient/family education;ADLs/Self Care Home Management;Therapeutic exercise;Therapeutic activities;Gait training;Manual techniques;Moist Heat;Electrical Stimulation;Cryotherapy   PT Next Visit Plan Assess respose to initial exercises; progress with LE strengthening and endurance activites for gait   PT Home Exercise Plan HEP   Consulted and Agree with Plan of Care Patient          G-Codes - 2014-12-25 1741    Functional Assessment Tool Used Clinical evaluation; FOTO; professional assessment    Functional Limitation Mobility: Walking and moving around   Mobility: Walking and Moving Around Current Status [id number]) At least 40 percent but less than 60 percent impaired, limited or restricted   Mobility: Walking and Moving Around Goal Status [phone]) At least 40 percent but less than 60 percent impaired, limited or restricted       Problem List Patient Active Problem List   Diagnosis Date Noted  . Lumbago 11/02/2014  . Obesity 10/28/2014  . Esophageal reflux 10/28/2014  . Essential hypertension 10/28/2014  . Mixed hyperlipidemia 10/28/2014  . Benign essential  tremor 10/28/2014  . Bilateral dry eyes   . Asthma   . Arthritis   . Cancer ([place])   . History of chicken pox     Amanda Copeland Nilda Simmer PT, MPH December 25, 2014, 5:51 PM  [facility] [address]  [place], [place], [address] Phone: [phone]   Fax:  [phone]  Name: Amanda Copeland MRN: [id number] Date of Birth: May 16, 1940

## 2015-01-13 ENCOUNTER — Ambulatory Visit: Payer: Medicare Other | Attending: Orthopedic Surgery | Admitting: Physical Therapy

## 2015-01-13 DIAGNOSIS — R29898 Other symptoms and signs involving the musculoskeletal system: Secondary | ICD-10-CM | POA: Insufficient documentation

## 2015-01-13 DIAGNOSIS — R269 Unspecified abnormalities of gait and mobility: Secondary | ICD-10-CM | POA: Insufficient documentation

## 2015-01-13 DIAGNOSIS — R531 Weakness: Secondary | ICD-10-CM

## 2015-01-13 DIAGNOSIS — R6889 Other general symptoms and signs: Secondary | ICD-10-CM

## 2015-01-13 DIAGNOSIS — Z7409 Other reduced mobility: Secondary | ICD-10-CM | POA: Insufficient documentation

## 2015-01-13 NOTE — Therapy (Signed)
[facility] [address]  [place], [place], [address] Phone: [phone]   Fax:  [phone]  Physical Therapy Treatment  Patient Details  Name: Amanda Copeland MRN: [id number] Date of Birth: [birth year] Referring Provider: Dr. Paralee Cancel   Encounter Date: 01/13/2015      PT End of Session - 01/13/15 1034    Visit Number 2   Number of Visits 12   Date for PT Re-Evaluation 02/04/15   PT Start Time 1020   PT Stop Time 1059   PT Time Calculation (min) 39 min   Activity Tolerance Patient tolerated treatment well;Patient limited by fatigue   Behavior During Therapy [facility] for tasks assessed/performed      Past Medical History  Diagnosis Date  . Dry eyes   . Benign essential tremor   . Bilateral dry eyes   . Asthma     childhood now returning  . Arthritis   . Cancer ([place])     Breast  . Obesity 10/28/2014  . Mixed hyperlipidemia 10/28/2014  . History of chicken pox   . H/O measles   . H/O mumps   . Benign essential tremor 10/28/2014  . Back pain affecting pregnancy 11/02/2014  . Lumbago 11/02/2014    Past Surgical History  Procedure Laterality Date  . Mastectomy Right   . Cholecystectomy    . Back surgery    . Rotator cuff repair    . Replacement total knee bilateral    . Breast reduction surgery Left   . Cholecystectomy    . Eye surgery  2008    b/l cataracts removed, in [facility]    There were no vitals filed for this visit.  Visit Diagnosis:  Decreased strength, endurance, and mobility  Weakness of both legs  Abnormal gait      Subjective Assessment - 01/13/15 1026    Subjective Patient returning to therapy after ~3 week absence since eval due to traveling/vacation with children and grandchildren to [place] [place] (used wheelchair to navigate amusement parks). Would like to be walking better in time for a cruise planned for the end of January. Reports completed HEP sporadically while she was gone  and did not do wall slides due to increased knee pain. Typically knee pain only very brief during sit to stand transitions.   Currently in Pain? No/denies          Today's Treatment  TherEx NuStep lvl 4 x 4' Prone glut sets x10 Prone hip extension x10 (limited range available) Standing hip extension leaning upper body on counter x10 Sit to/from stand x10 without UE support Manual hamstring and piriformis stretches 2x30" each, bil Bridge 10x3" Hooklying alternating SL hip ABD/ER clam with green TB 10x3" TRX squats x5 (deferred d/t knee pain) Side step over 1/2 foam roll with UE support on counter x10 bil Standing march x10           PT Long Term Goals - 01/13/15 1300    PT LONG TERM GOAL #1   Title Tolerante standing for 15 min 02/04/15   Status On-going   PT LONG TERM GOAL #2   Title Walk in community for functional distances for 10-15 min 02/04/15   Status On-going   PT LONG TERM GOAL #3   Title Improve activity level with pt tolerating 30-40 min of exercise 2-4 days/wk 02/04/15   Status On-going   PT LONG TERM GOAL #4   Title Independent in HEP for discharge 02/04/15  Status On-going   PT LONG TERM GOAL #5   Title Improve FOTO to </= 44% limitation 02/04/15   Status On-going               Plan - 01/13/15 1301    Clinical Impression Statement Limited compliance with HEP due to traveling, with wall sits deferred secondary to increased knee pain. Progressed supine and standing exercises with slight limitation due to fatigue. If able to perform well at next visit will update HEP accordingly.   PT Next Visit Plan Progress with LE strengthening and endurance activites for gait, Update HEP as appropriate   Consulted and Agree with Plan of Care Patient        Problem List Patient Active Problem List   Diagnosis Date Noted  . Lumbago 11/02/2014  . Obesity 10/28/2014  . Esophageal reflux 10/28/2014  . Essential hypertension 10/28/2014  . Mixed  hyperlipidemia 10/28/2014  . Benign essential tremor 10/28/2014  . Bilateral dry eyes   . Asthma   . Arthritis   . Cancer ([place])   . History of chicken pox     Percival Spanish, PT, MPT 01/13/2015, 1:06 PM  [facility] [address]  [place], [place], [address] Phone: [phone]   Fax:  [phone]  Name: Amanda Copeland MRN: [id number] Date of Birth: [birth year]

## 2015-01-16 ENCOUNTER — Ambulatory Visit: Payer: Medicare Other | Admitting: Physical Therapy

## 2015-01-16 DIAGNOSIS — R269 Unspecified abnormalities of gait and mobility: Secondary | ICD-10-CM | POA: Diagnosis not present

## 2015-01-16 DIAGNOSIS — R531 Weakness: Secondary | ICD-10-CM

## 2015-01-16 DIAGNOSIS — Z7409 Other reduced mobility: Principal | ICD-10-CM

## 2015-01-16 DIAGNOSIS — R29898 Other symptoms and signs involving the musculoskeletal system: Secondary | ICD-10-CM | POA: Diagnosis not present

## 2015-01-16 NOTE — Therapy (Signed)
[facility] [address]  [place], [place], [address] Phone: [phone]   Fax:  [phone]  Physical Therapy Treatment  Patient Details  Name: Amanda Copeland MRN: [id number] Date of Birth: 1940-08-01 Referring Provider: Dr. Paralee Cancel   Encounter Date: 01/16/2015      PT End of Session - 01/16/15 1025    Visit Number 3   Number of Visits 12   Date for PT Re-Evaluation 02/04/15   PT Copeland Time 1017   PT Stop Time 1100   PT Time Calculation (min) 43 min   Activity Tolerance Patient tolerated treatment well;Patient limited by fatigue   Behavior During Therapy [facility] for tasks assessed/performed      Past Medical History  Diagnosis Date  . Dry eyes   . Benign essential tremor   . Bilateral dry eyes   . Asthma     childhood now returning  . Arthritis   . Cancer ([place])     Breast  . Obesity 10/28/2014  . Mixed hyperlipidemia 10/28/2014  . History of chicken pox   . H/O measles   . H/O mumps   . Benign essential tremor 10/28/2014  . Back pain affecting pregnancy 11/02/2014  . Lumbago 11/02/2014    Past Surgical History  Procedure Laterality Date  . Mastectomy Right   . Cholecystectomy    . Back surgery    . Rotator cuff repair    . Replacement total knee bilateral    . Breast reduction surgery Left   . Cholecystectomy    . Eye surgery  2008    b/l cataracts removed, in [facility]    There were no vitals filed for this visit.  Visit Diagnosis:  Decreased strength, endurance, and mobility  Weakness of both legs  Abnormal gait      Subjective Assessment - 01/16/15 1021    Subjective Patient noting some pain in lateral posterior knee last night but not present now.   Currently in Pain? No/denies            Today's Treatment   TherEx NuStep lvl 5 x 4' Manual hamstring, ITB and piriformis stretches 2x30" each, bil (patient tighter on right with all stretches) Bridge 12x3" Bridge with  isometric Hip Abd with green TB 10x3" Hooklying alternating SL hip ABD/ER clam with green TB 12x3" Sidelying Hip Abd/ER clam with green TB 12x3", bil SAQ with 2# 10x3", bil Prone glut sets x10 Prone hip extension 2# x10 (limited range available) Prone hamstring curl with 2# x10 Seated hamstring curl with green TB x10, bil Sit to/from stand x10 without UE support            PT Long Term Goals - 01/13/15 1300    PT LONG TERM GOAL #1   Title Tolerante standing for 15 min 02/04/15   Status On-going   PT LONG TERM GOAL #2   Title Walk in community for functional distances for 10-15 min 02/04/15   Status On-going   PT LONG TERM GOAL #3   Title Improve activity level with pt tolerating 30-40 min of exercise 2-4 days/wk 02/04/15   Status On-going   PT LONG TERM GOAL #4   Title Independent in HEP for discharge 02/04/15   Status On-going   PT LONG TERM GOAL #5   Title Improve FOTO to </= 44% limitation 02/04/15   Status On-going               Plan -  01/16/15 1101    Clinical Impression Statement Patient tolerating progression of resistance and repetitions very slowly with exercise tolerance limited by fatigue and SOB requiring frequent rest breaks. Patient reports she belongs to the [facility] but has not been recently. Encouraged patient to attempt to use cardio machines such [facility] to work on building endurance. Will plan to update HEP at next visit.   PT Next Visit Plan Progress with LE strengthening and endurance activites for gait, Update HEP as appropriate   Consulted and Agree with Plan of Care Patient        Problem List Patient Active Problem List   Diagnosis Date Noted  . Lumbago 11/02/2014  . Obesity 10/28/2014  . Esophageal reflux 10/28/2014  . Essential hypertension 10/28/2014  . Mixed hyperlipidemia 10/28/2014  . Benign essential tremor 10/28/2014  . Bilateral dry eyes   . Asthma   . Arthritis   . Cancer ([place])   .  History of chicken pox     Percival Spanish, PT, MPT 01/16/2015, 12:22 PM  [facility] [address]  [place], [place], [address] Phone: [phone]   Fax:  [phone]  Name: Amanda Copeland MRN: [id number] Date of Birth: 1940/08/27

## 2015-01-19 ENCOUNTER — Ambulatory Visit: Payer: Medicare Other | Admitting: Physical Therapy

## 2015-01-19 DIAGNOSIS — R269 Unspecified abnormalities of gait and mobility: Secondary | ICD-10-CM | POA: Diagnosis not present

## 2015-01-19 DIAGNOSIS — Z7409 Other reduced mobility: Principal | ICD-10-CM

## 2015-01-19 DIAGNOSIS — R29898 Other symptoms and signs involving the musculoskeletal system: Secondary | ICD-10-CM | POA: Diagnosis not present

## 2015-01-19 DIAGNOSIS — R531 Weakness: Secondary | ICD-10-CM

## 2015-01-19 NOTE — Therapy (Signed)
[facility] [address]  [place], [place], [address] Phone: [phone]   Fax:  [phone]  Physical Therapy Treatment  Patient Details  Name: Amanda Copeland MRN: [id number] Date of Birth: 1940-10-26 Referring Provider: Dr. Paralee Cancel   Encounter Date: 01/19/2015      PT End of Session - 01/19/15 1022    Visit Number 4   Number of Visits 12   Date for PT Re-Evaluation 02/04/15   PT Start Time 1019   PT Stop Time 1104   PT Time Calculation (min) 45 min   Activity Tolerance Patient tolerated treatment well   Behavior During Therapy [facility] for tasks assessed/performed      Past Medical History  Diagnosis Date  . Dry eyes   . Benign essential tremor   . Bilateral dry eyes   . Asthma     childhood now returning  . Arthritis   . Cancer ([place])     Breast  . Obesity 10/28/2014  . Mixed hyperlipidemia 10/28/2014  . History of chicken pox   . H/O measles   . H/O mumps   . Benign essential tremor 10/28/2014  . Back pain affecting pregnancy 11/02/2014  . Lumbago 11/02/2014    Past Surgical History  Procedure Laterality Date  . Mastectomy Right   . Cholecystectomy    . Back surgery    . Rotator cuff repair    . Replacement total knee bilateral    . Breast reduction surgery Left   . Cholecystectomy    . Eye surgery  2008    b/l cataracts removed, in [facility]    There were no vitals filed for this visit.  Visit Diagnosis:  Decreased strength, endurance, and mobility  Weakness of both legs  Abnormal gait      Subjective Assessment - 01/19/15 1021    Subjective Patient denies pain today but states tired after having to park further away and walk longer into therapy today.   Currently in Pain? No/denies             Today's Treatment   TherEx NuStep lvl 5 x 4' Bridge with isometric Hip Abd with green TB 12x3" (added to HEP) Hooklying alternating SL hip ABD/ER clam with green TB 12x3" (added  to HEP) Sidelying Hip Abd/ER clam with green TB 12x3", bil (added to HEP) SAQ with 2# 12x3", bil (added to HEP) Prone hamstring curl with 2# x10 (pain in posterior knee on right) Seated hamstring curl with green TB x12, bil (added to HEP)    Manual  Manual hamstring, ITB and piriformis stretches 2x30" each, bil (patient tighter on right with all stretches) STM & DTM to Rt piriformis in Lt sidelying            PT Education - 01/19/15 1126    Education Details Updated HEP, Piriformis musclel release with tennis ball   Person(s) Educated Patient   Methods Explanation;Demonstration   Comprehension Verbalized understanding;Returned demonstration             PT Long Term Goals - 01/13/15 1300    PT LONG TERM GOAL #1   Title Tolerante standing for 15 min 02/04/15   Status On-going   PT LONG TERM GOAL #2   Title Walk in community for functional distances for 10-15 min 02/04/15   Status On-going   PT LONG TERM GOAL #3   Title Improve activity level with pt tolerating 30-40 min of exercise 2-4 days/wk  02/04/15   Status On-going   PT LONG TERM GOAL #4   Title Independent in HEP for discharge 02/04/15   Status On-going   PT LONG TERM GOAL #5   Title Improve FOTO to </= 44% limitation 02/04/15   Status On-going               Plan - 01/19/15 1127    Clinical Impression Statement Patient complaining of increased discomfort in right piriformis during stretching today, therefore targeted STM & DTM to reduce muscle tightness and instructed patient in techniques for sef-release at home. HEP updated to include progression of core/proximal LE strengthening well tolerated during therapy session. Patient continues to demonstrate limited endurance and fatigues quickly during exercises.   PT Next Visit Plan Progress with LE strengthening and endurance activites for gait, Update HEP as appropriate   Consulted and Agree with Plan of Care Patient        Problem List Patient  Active Problem List   Diagnosis Date Noted  . Lumbago 11/02/2014  . Obesity 10/28/2014  . Esophageal reflux 10/28/2014  . Essential hypertension 10/28/2014  . Mixed hyperlipidemia 10/28/2014  . Benign essential tremor 10/28/2014  . Bilateral dry eyes   . Asthma   . Arthritis   . Cancer ([place])   . History of chicken pox     Percival Spanish, PT, MPT 01/19/2015, 12:06 PM  [facility] [address]  [place], [place], [address] Phone: [phone]   Fax:  [phone]  Name: Amanda Copeland MRN: [id number] Date of Birth: [birth year]

## 2015-01-23 ENCOUNTER — Ambulatory Visit: Payer: Medicare Other | Admitting: Physical Therapy

## 2015-01-23 DIAGNOSIS — R29898 Other symptoms and signs involving the musculoskeletal system: Secondary | ICD-10-CM

## 2015-01-23 DIAGNOSIS — R269 Unspecified abnormalities of gait and mobility: Secondary | ICD-10-CM

## 2015-01-23 DIAGNOSIS — R531 Weakness: Secondary | ICD-10-CM

## 2015-01-23 DIAGNOSIS — Z7409 Other reduced mobility: Principal | ICD-10-CM

## 2015-01-23 NOTE — Therapy (Signed)
[facility] [address]  [place], [place], [address] Phone: [phone]   Fax:  [phone]  Physical Therapy Treatment  Patient Details  Name: Amanda Copeland MRN: [id number] Date of Birth: January 15, 1941 Referring Provider: Dr. Paralee Cancel   Encounter Date: 01/23/2015      PT End of Session - 01/23/15 1020    Visit Number 5   Number of Visits 12   Date for PT Re-Evaluation 02/04/15   PT Start Time [id number]   PT Stop Time 1058   PT Time Calculation (min) 43 min   Activity Tolerance Patient tolerated treatment well   Behavior During Therapy [facility] for tasks assessed/performed      Past Medical History  Diagnosis Date  . Dry eyes   . Benign essential tremor   . Bilateral dry eyes   . Asthma     childhood now returning  . Arthritis   . Cancer ([place])     Breast  . Obesity 10/28/2014  . Mixed hyperlipidemia 10/28/2014  . History of chicken pox   . H/O measles   . H/O mumps   . Benign essential tremor 10/28/2014  . Back pain affecting pregnancy 11/02/2014  . Lumbago 11/02/2014    Past Surgical History  Procedure Laterality Date  . Mastectomy Right   . Cholecystectomy    . Back surgery    . Rotator cuff repair    . Replacement total knee bilateral    . Breast reduction surgery Left   . Cholecystectomy    . Eye surgery  2008    b/l cataracts removed, in [facility]    There were no vitals filed for this visit.  Visit Diagnosis:  Decreased strength, endurance, and mobility  Weakness of both legs  Abnormal gait      Subjective Assessment - 01/23/15 1018    Subjective Reports able to do updated HEP exercises on bed, but reported diffculty with bridge due to feet sinking into mattress.   Currently in Pain? No/denies           Today's Treatment   TherEx NuStep lvl 5 x 4' Bridge with isometric Hip Abd with green TB 15x3"  Hooklying alternating SL hip ABD/ER clam with green TB 15x3" Sidelying Hip  Abd/ER clam with green TB 12x3", bil  Standing on blue foam - UE support on back of chair:   Heel raises x15   Alt hip abduction x15   Alt hip extension x15   Marching x15 Side-step over 1/2 foam roll with UE support on counter x15 bil Stepping over cones with 1 HHA on counter Semi-tandem stance on blue foam 2x20" each           PT Long Term Goals - 01/23/15 1058    PT LONG TERM GOAL #1   Title Tolerante standing for 15 min 02/04/15   Status On-going   PT LONG TERM GOAL #2   Title Walk in community for functional distances for 10-15 min 02/04/15   Status On-going   PT LONG TERM GOAL #3   Title Improve activity level with pt tolerating 30-40 min of exercise 2-4 days/wk 02/04/15   Status On-going   PT LONG TERM GOAL #4   Title Independent in HEP for discharge 02/04/15   Status On-going   PT LONG TERM GOAL #5   Title Improve FOTO to </= 44% limitation 02/04/15   Status On-going  Plan - 01/23/15 1059    Clinical Impression Statement Patient slowly tolerating increased repetitions with exercises and able to introduce more standing activities today, but still fatigues requiring occasional rest breaks. Patient reporting increased ease of sit to stand transitions with decreased need for UE assist except when rising from rocker recliner.   PT Next Visit Plan Progress with LE strengthening and endurance activites for gait, Update HEP as appropriate   Consulted and Agree with Plan of Care Patient        Problem List Patient Active Problem List   Diagnosis Date Noted  . Lumbago 11/02/2014  . Obesity 10/28/2014  . Esophageal reflux 10/28/2014  . Essential hypertension 10/28/2014  . Mixed hyperlipidemia 10/28/2014  . Benign essential tremor 10/28/2014  . Bilateral dry eyes   . Asthma   . Arthritis   . Cancer ([place])   . History of chicken pox     Percival Spanish, PT, MPT 01/23/2015, 12:09 PM  [facility] [address]  [place], [place], [address] Phone: [phone]   Fax:  [phone]  Name: Amanda Copeland MRN: [id number] Date of Birth: 09-May-1940

## 2015-01-26 ENCOUNTER — Ambulatory Visit: Payer: Medicare Other | Admitting: Physical Therapy

## 2015-01-26 DIAGNOSIS — Z7409 Other reduced mobility: Principal | ICD-10-CM

## 2015-01-26 DIAGNOSIS — R29898 Other symptoms and signs involving the musculoskeletal system: Secondary | ICD-10-CM

## 2015-01-26 DIAGNOSIS — R531 Weakness: Secondary | ICD-10-CM

## 2015-01-26 DIAGNOSIS — R269 Unspecified abnormalities of gait and mobility: Secondary | ICD-10-CM | POA: Diagnosis not present

## 2015-01-26 NOTE — Therapy (Addendum)
[facility] [address]  [place], [place], [address] Phone: [phone]   Fax:  [phone]  Physical Therapy Treatment  Patient Details  Name: Amanda Copeland MRN: [id number] Date of Birth: 1940-03-08 Referring Provider: Dr. Paralee Cancel   Encounter Date: 01/26/2015      PT End of Session - 01/26/15 1022    Visit Number 6   Number of Visits 12   Date for PT Re-Evaluation 02/04/15   PT Start Time 1016   PT Stop Time 1102   PT Time Calculation (min) 46 min   Activity Tolerance Patient tolerated treatment well   Behavior During Therapy [facility] for tasks assessed/performed      Past Medical History  Diagnosis Date  . Dry eyes   . Benign essential tremor   . Bilateral dry eyes   . Asthma     childhood now returning  . Arthritis   . Cancer ([place])     Breast  . Obesity 10/28/2014  . Mixed hyperlipidemia 10/28/2014  . History of chicken pox   . H/O measles   . H/O mumps   . Benign essential tremor 10/28/2014  . Back pain affecting pregnancy 11/02/2014  . Lumbago 11/02/2014    Past Surgical History  Procedure Laterality Date  . Mastectomy Right   . Cholecystectomy    . Back surgery    . Rotator cuff repair    . Replacement total knee bilateral    . Breast reduction surgery Left   . Cholecystectomy    . Eye surgery  2008    b/l cataracts removed, in [facility]    There were no vitals filed for this visit.  Visit Diagnosis:  Decreased strength, endurance, and mobility  Weakness of both legs  Abnormal gait      Subjective Assessment - 01/26/15 1019    Subjective Patient reports she did a lot of walking over the weekend - outlet shopping and other shopping - and tolerated it well. States legs don't give out as quickly but still feels balance is limited.   How long can you stand comfortably? 45 minutes - intermittent standing and walking (at outlet mall)   How long can you walk comfortably? 45 minutes -  intermittent standing and walking (at [employer] mall)   Currently in Pain? No/denies           Today's Treatment  TherEx NuStep lvl 5 x 5' Bridge with isometric Hip Abd with blue TB (padded with hand towels) 2x10, 3" hold Hooklying alternating SL hip ABD/ER clam with blue TB (padded with hand towels) 2x10, 3" hold Sidelying Hip Abd/ER clam with blue TB (padded with hand towels) R 15x3", L 2x10x3"  Standing on blue foam - min UE support on back of chair:  Heel raises x15  Alt hip abduction x15  Alt hip extension x15  Marching x15   Alt step-touch to 8" step x15 Lat Side-step up/down over 6" step with HHA on counter x10  Neuro Standing on blue foam - back to corner:   Narrow BOS - arms at sides progressing to [place] stance      Visual scanning turning head side to side, up/down x10      Eyes closed 2x15"   Semi-tandem stance (bil) - arms at sides progressing to [place] stance      Visual scanning turning head side to side, up/down x5      Eyes closed 2x10"  PT Long Term Goals - 01/23/15 1058    PT LONG TERM GOAL #1   Title Tolerante standing for 15 min 02/04/15   Status On-going   PT LONG TERM GOAL #2   Title Walk in community for functional distances for 10-15 min 02/04/15   Status On-going   PT LONG TERM GOAL #3   Title Improve activity level with pt tolerating 30-40 min of exercise 2-4 days/wk 02/04/15   Status On-going   PT LONG TERM GOAL #4   Title Independent in HEP for discharge 02/04/15   Status On-going   PT LONG TERM GOAL #5   Title Improve FOTO to </= 44% limitation 02/04/15   Status On-going               Plan - 02/15/15 1104    Clinical Impression Statement Patient demonstrating good progress with PT with increased ease of transfers, and improving standing and walking tolerance. Able to tolerate progression of resistance and/or reps with most exercises before fatiguing. Patient reports continues to feel most limited with  balance but demonstrates reluctance to attempt balance activities without firm UE support, therefore positioned blue foam mat with patient's back to corner to allow for lateral and posterior support in event of LOB.   PT Next Visit Plan Progress with LE strengthening and endurance activites for gait, Update HEP as appropriate   Consulted and Agree with Plan of Care Patient        Problem List Patient Active Problem List   Diagnosis Date Noted  . Lumbago 11/02/2014  . Obesity 10/28/2014  . Esophageal reflux 10/28/2014  . Essential hypertension 10/28/2014  . Mixed hyperlipidemia 10/28/2014  . Benign essential tremor 10/28/2014  . Bilateral dry eyes   . Asthma   . Arthritis   . Cancer ([place])   . History of chicken pox     Percival Spanish, PT, MPT 02/15/2015, 11:23 AM  [facility] [address]  [place], [place], [address] Phone: [phone]   Fax:  [phone]  Name: Amanda Copeland MRN: [id number] Date of Birth: 12-Mar-1940   PHYSICAL THERAPY DISCHARGE SUMMARY  Visits from Start of Care: 6  Current functional level related to goals / functional outcomes:   As of last PT visit, patient demonstrating good progress with PT with increased ease of transfers, and improving standing and walking tolerance. Able to tolerate progression of resistance and/or reps with most exercises before fatiguing. Patient reported that she continued to feel most limited with balance but demonstrated reluctance to attempt balance activities without firm UE support. Unable to further assess status at discharge as patient failed to return for any further PT visits.   Remaining deficits:  Balance. Unable to fully assess secondary to failure to return for further PT visits.    Education / Equipment:  HEP    G-Codes - 02/15/15     Functional Assessment Tool Used Clinical evaluation; FOTO; professional assessment    Functional Limitation  Mobility: Walking and moving around   Mobility: Walking and Moving Around Current Status [phone]) At least 40 percent but less than 60 percent impaired, limited or restricted   Mobility: Walking and Moving Around Goal Status ([id number]) At least 40 percent but less than 60 percent impaired, limited or restricted   Mobility: Walking and Moving Around Discharge Status [phone]) At least 40 percent but less than 60 percent impaired, limited or restricted  Plan: Patient agrees to discharge.  Patient goals were not met. Patient is being discharged due to meeting the stated rehab goals.  ?????

## 2015-01-27 ENCOUNTER — Ambulatory Visit: Payer: Medicare Other | Admitting: Physical Therapy

## 2015-01-30 ENCOUNTER — Ambulatory Visit: Payer: Medicare Other | Admitting: Physical Therapy

## 2015-02-03 ENCOUNTER — Ambulatory Visit (INDEPENDENT_AMBULATORY_CARE_PROVIDER_SITE_OTHER): Payer: Medicare Other | Admitting: Family Medicine

## 2015-02-03 ENCOUNTER — Encounter: Payer: Self-pay | Admitting: Family Medicine

## 2015-02-03 ENCOUNTER — Ambulatory Visit: Payer: Medicare Other | Admitting: Physical Therapy

## 2015-02-03 VITALS — BP 137/76 | HR 69 | Temp 97.8°F | Resp 20 | Wt 242.8 lb

## 2015-02-03 DIAGNOSIS — J209 Acute bronchitis, unspecified: Secondary | ICD-10-CM | POA: Insufficient documentation

## 2015-02-03 DIAGNOSIS — I6529 Occlusion and stenosis of unspecified carotid artery: Secondary | ICD-10-CM

## 2015-02-03 MED ORDER — DOXYCYCLINE HYCLATE 100 MG PO TABS: 100.0000 mg | ORAL_TABLET | Freq: Two times a day (BID) | ORAL | Status: DC

## 2015-02-03 MED ORDER — PREDNISONE 50 MG PO TABS: 50.0000 mg | ORAL_TABLET | Freq: Every day | ORAL | Status: DC

## 2015-02-03 MED ORDER — ALBUTEROL SULFATE HFA 108 (90 BASE) MCG/ACT IN AERS: 2.0000 | INHALATION_SPRAY | Freq: Four times a day (QID) | RESPIRATORY_TRACT | Status: DC | PRN

## 2015-02-03 NOTE — Patient Instructions (Signed)
Albuterol inhaler called in for you. Use only if needed for shortness of breath. I have prescribed doxycycline and prednisone for your infection.  Use OTC plain mucinex and cough Supressant.  I would placed a humidifier in her bedroom.    Acute Bronchitis Bronchitis is inflammation of the airways that extend from the windpipe into the lungs (bronchi). The inflammation often causes mucus to develop. This leads to a cough, which is the most common symptom of bronchitis.  In acute bronchitis, the condition usually develops suddenly and goes away over time, usually in a couple weeks. Smoking, allergies, and asthma can make bronchitis worse. Repeated episodes of bronchitis may cause further lung problems.  CAUSES Acute bronchitis is most often caused by the same virus that causes a cold. The virus can spread from person to person (contagious) through coughing, sneezing, and touching contaminated objects. SIGNS AND SYMPTOMS   Cough.   Fever.   Coughing up mucus.   Body aches.   Chest congestion.   Chills.   Shortness of breath.   Sore throat.  DIAGNOSIS  Acute bronchitis is usually diagnosed through a physical exam. Your health care provider will also ask you questions about your medical history. Tests, such as chest X-rays, are sometimes done to rule out other conditions.  TREATMENT  Acute bronchitis usually goes away in a couple weeks. Oftentimes, no medical treatment is necessary. Medicines are sometimes given for relief of fever or cough. Antibiotic medicines are usually not needed but may be prescribed in certain situations. In some cases, an inhaler may be recommended to help reduce shortness of breath and control the cough. A cool mist vaporizer may also be used to help thin bronchial secretions and make it easier to clear the chest.  HOME CARE INSTRUCTIONS  Get plenty of rest.   Drink enough fluids to keep your urine clear or pale yellow (unless you have a medical  condition that requires fluid restriction). Increasing fluids may help thin your respiratory secretions (sputum) and reduce chest congestion, and it will prevent dehydration.   Take medicines only as directed by your health care provider.  If you were prescribed an antibiotic medicine, finish it all even if you start to feel better.  Avoid smoking and secondhand smoke. Exposure to cigarette smoke or irritating chemicals will make bronchitis worse. If you are a smoker, consider using nicotine gum or skin patches to help control withdrawal symptoms. Quitting smoking will help your lungs heal faster.   Reduce the chances of another bout of acute bronchitis by washing your hands frequently, avoiding people with cold symptoms, and trying not to touch your hands to your mouth, nose, or eyes.   Keep all follow-up visits as directed by your health care provider.  SEEK MEDICAL CARE IF: Your symptoms do not improve after 1 week of treatment.  SEEK IMMEDIATE MEDICAL CARE IF:  You develop an increased fever or chills.   You have chest pain.   You have severe shortness of breath.  You have bloody sputum.   You develop dehydration.  You faint or repeatedly feel like you are going to pass out.  You develop repeated vomiting.  You develop a severe headache. MAKE SURE YOU:   Understand these instructions.  Will watch your condition.  Will get help right away if you are not doing well or get worse.   This information is not intended to replace advice given to you by your health care provider. Make sure you discuss any questions you  have with your health care provider.   Document Released: 03/03/2004 Document Revised: 02/14/2014 Document Reviewed: 07/17/2012 Elsevier Interactive Patient Education [employer].   If worsening or no improvement, followup with PCP in 1 week.

## 2015-02-03 NOTE — Progress Notes (Signed)
   Subjective:    Patient ID: Amanda Copeland, female    DOB: 1940-04-02, 74 y.o.   MRN: [id number]  HPI   Cough: Patient states that she reported feeling bad the day before Christmas. She reports it started with a mild sore throat, and has progressed into a cough. Patient reports her grandson was sick over the Christmas holiday with "croup." She states last night her cough became more harsh, and she felt like she was wheezing through the night. She endorses becoming easily short of breath with activity. She has a history of asthma as a child, and states whenever she gets a cold like this because "straight her chest ". She denies nausea, vomit, diarrhea or rash. She is tolerating by mouth well. He does not have an albuterol inhaler home, but reports compliance with Symbicort. She has tried Advil as needed.  She has had her flu shot issue. .  Never smoker  Past Medical History  Diagnosis Date  . Dry eyes   . Benign essential tremor   . Bilateral dry eyes   . Asthma     childhood now returning  . Arthritis   . Cancer ([place])     Breast  . Obesity 10/28/2014  . Mixed hyperlipidemia 10/28/2014  . History of chicken pox   . H/O measles   . H/O mumps   . Benign essential tremor 10/28/2014  . Back pain affecting pregnancy 11/02/2014  . Lumbago 11/02/2014   Allergies  Allergen Reactions  . Statins Other (See Comments)    Per reports muscle aches and joint pain  . Amoxicillin Rash  . Ampicillin Rash  . Penicillins Rash    Review of Systems Negative, with the exception of above mentioned in HPI     Objective:   Physical Exam BP 137/76 mmHg  Pulse 69  Temp(Src) 97.8 F (36.6 C)  Resp 20  Wt 242 lb 12 oz (110.111 kg)  SpO2 94% Gen: Afebrile. No acute distress. Non toxic in appearance. Well developed, well nourished. HENT: AT. [place]. Bilateral TM visualized and normal in appearance. MMM, no oral lesons. Bilateral nares with mild erythema, no swelling. Throat without erythema or exudates.  Cobblestoning present. Cough and hoarseness present on exam.  Eyes:Pupils Equal Round Reactive to light, Extraocular movements intact,  Conjunctiva without redness, discharge or icterus. Neck/lymp/endocrine: Supple,No Lymphadenopathy CV: RRR, No edema Chest: CTAB, no wheeze or crackles.  Abd: Soft. NTND. BS present Skin: No rashes, purpura or petechiae.  Neuro: Normal gait. PERLA. EOMi. Alert. Oriented x3     Assessment & Plan:  1. Acute bronchitis, unspecified organism - rest, hydrate, albuterol as needed, abx, prednisone,  Humidifier. OTC mucinex/cough Supressant.  - albuterol (PROVENTIL HFA;VENTOLIN HFA) 108 (90 BASE) MCG/ACT inhaler; Inhale 2 puffs into the lungs every 6 (six) hours as needed for wheezing or shortness of breath.  Dispense: 1 Inhaler; Refill: 0 - doxycycline (VIBRA-TABS) 100 MG tablet; Take 1 tablet (100 mg total) by mouth 2 (two) times daily.  Dispense: 20 tablet; Refill: 0 - predniSONE (DELTASONE) 50 MG tablet; Take 1 tablet (50 mg total) by mouth daily with breakfast.  Dispense: 5 tablet; Refill: 0 Follow-up as needed

## 2015-02-06 ENCOUNTER — Ambulatory Visit: Payer: Medicare Other | Admitting: Physical Therapy

## 2015-02-12 ENCOUNTER — Encounter: Payer: Self-pay | Admitting: Family Medicine

## 2015-02-12 ENCOUNTER — Ambulatory Visit (HOSPITAL_BASED_OUTPATIENT_CLINIC_OR_DEPARTMENT_OTHER)
Admission: RE | Admit: 2015-02-12 | Discharge: 2015-02-12 | Disposition: A | Discharge status: Home / Self Care | Payer: Medicare Other | Source: Ambulatory Visit | Attending: Family Medicine | Admitting: Family Medicine

## 2015-02-12 ENCOUNTER — Ambulatory Visit (INDEPENDENT_AMBULATORY_CARE_PROVIDER_SITE_OTHER): Payer: Medicare Other | Admitting: Family Medicine

## 2015-02-12 VITALS — BP 128/80 | HR 66 | Temp 97.9°F | Ht 61.0 in | Wt 241.0 lb

## 2015-02-12 DIAGNOSIS — J209 Acute bronchitis, unspecified: Secondary | ICD-10-CM | POA: Insufficient documentation

## 2015-02-12 DIAGNOSIS — Z1239 Encounter for other screening for malignant neoplasm of breast: Secondary | ICD-10-CM

## 2015-02-12 DIAGNOSIS — M8580 Other specified disorders of bone density and structure, unspecified site: Secondary | ICD-10-CM | POA: Diagnosis not present

## 2015-02-12 DIAGNOSIS — Z780 Asymptomatic menopausal state: Secondary | ICD-10-CM | POA: Diagnosis not present

## 2015-02-12 DIAGNOSIS — R05 Cough: Secondary | ICD-10-CM | POA: Diagnosis not present

## 2015-02-12 DIAGNOSIS — I10 Essential (primary) hypertension: Secondary | ICD-10-CM

## 2015-02-12 LAB — CBC WITH DIFFERENTIAL/PLATELET
BASOS ABS: 0 10*3/uL (ref 0.0–0.1)
Basophils Relative: 0.3 % (ref 0.0–3.0)
EOS PCT: 3 % (ref 0.0–5.0)
Eosinophils Absolute: 0.4 10*3/uL (ref 0.0–0.7)
HEMATOCRIT: 43.8 % (ref 36.0–46.0)
HEMOGLOBIN: 14.5 g/dL (ref 12.0–15.0)
LYMPHS ABS: 2.9 10*3/uL (ref 0.7–4.0)
LYMPHS PCT: 21.8 % (ref 12.0–46.0)
MCHC: 33.2 g/dL (ref 30.0–36.0)
MCV: 90.5 fl (ref 78.0–100.0)
MONOS PCT: 6.6 % (ref 3.0–12.0)
Monocytes Absolute: 0.9 10*3/uL (ref 0.1–1.0)
NEUTROS PCT: 68.3 % (ref 43.0–77.0)
Neutro Abs: 9 10*3/uL — ABNORMAL HIGH (ref 1.4–7.7)
Platelets: 412 10*3/uL — ABNORMAL HIGH (ref 150.0–400.0)
RBC: 4.84 Mil/uL (ref 3.87–5.11)
RDW: 14.6 % (ref 11.5–15.5)
WBC: 13.2 10*3/uL — ABNORMAL HIGH (ref 4.0–10.5)

## 2015-02-12 MED ORDER — METHYLPREDNISOLONE 4 MG PO TABS: ORAL_TABLET | ORAL | Status: DC

## 2015-02-12 MED ORDER — HYDROCODONE-HOMATROPINE 5-1.5 MG/5ML PO SYRP: 5.0000 mL | ORAL_SOLUTION | Freq: Three times a day (TID) | ORAL | Status: DC | PRN

## 2015-02-12 MED ORDER — LEVOFLOXACIN 250 MG PO TABS
250.0000 mg | ORAL_TABLET | Freq: Every day | ORAL | Status: DC
Start: 2015-02-12 — End: 2015-03-30

## 2015-02-12 NOTE — Progress Notes (Signed)
Pre visit review using our clinic review tool, if applicable. No additional management support is needed unless otherwise documented below in the visit note. 

## 2015-02-12 NOTE — Patient Instructions (Addendum)
Nurse visit can get tetanus and prevnar when feelse  Acute Bronchitis Bronchitis is inflammation of the airways that extend from the windpipe into the lungs (bronchi). The inflammation often causes mucus to develop. This leads to a cough, which is the most common symptom of bronchitis.  In acute bronchitis, the condition usually develops suddenly and goes away over time, usually in a couple weeks. Smoking, allergies, and asthma can make bronchitis worse. Repeated episodes of bronchitis may cause further lung problems.  CAUSES Acute bronchitis is most often caused by the same virus that causes a cold. The virus can spread from person to person (contagious) through coughing, sneezing, and touching contaminated objects. SIGNS AND SYMPTOMS   Cough.   Fever.   Coughing up mucus.   Body aches.   Chest congestion.   Chills.   Shortness of breath.   Sore throat.  DIAGNOSIS  Acute bronchitis is usually diagnosed through a physical exam. Your health care provider will also ask you questions about your medical history. Tests, such as chest X-rays, are sometimes done to rule out other conditions.  TREATMENT  Acute bronchitis usually goes away in a couple weeks. Oftentimes, no medical treatment is necessary. Medicines are sometimes given for relief of fever or cough. Antibiotic medicines are usually not needed but may be prescribed in certain situations. In some cases, an inhaler may be recommended to help reduce shortness of breath and control the cough. A cool mist vaporizer may also be used to help thin bronchial secretions and make it easier to clear the chest.  HOME CARE INSTRUCTIONS  Get plenty of rest.   Drink enough fluids to keep your urine clear or pale yellow (unless you have a medical condition that requires fluid restriction). Increasing fluids may help thin your respiratory secretions (sputum) and reduce chest congestion, and it will prevent dehydration.   Take medicines  only as directed by your health care provider.  If you were prescribed an antibiotic medicine, finish it all even if you start to feel better.  Avoid smoking and secondhand smoke. Exposure to cigarette smoke or irritating chemicals will make bronchitis worse. If you are a smoker, consider using nicotine gum or skin patches to help control withdrawal symptoms. Quitting smoking will help your lungs heal faster.   Reduce the chances of another bout of acute bronchitis by washing your hands frequently, avoiding people with cold symptoms, and trying not to touch your hands to your mouth, nose, or eyes.   Keep all follow-up visits as directed by your health care provider.  SEEK MEDICAL CARE IF: Your symptoms do not improve after 1 week of treatment.  SEEK IMMEDIATE MEDICAL CARE IF:  You develop an increased fever or chills.   You have chest pain.   You have severe shortness of breath.  You have bloody sputum.   You develop dehydration.  You faint or repeatedly feel like you are going to pass out.  You develop repeated vomiting.  You develop a severe headache. MAKE SURE YOU:   Understand these instructions.  Will watch your condition.  Will get help right away if you are not doing well or get worse.   This information is not intended to replace advice given to you by your health care provider. Make sure you discuss any questions you have with your health care provider.   Document Released: 03/03/2004 Document Revised: 02/14/2014 Document Reviewed: 07/17/2012 Elsevier Interactive Patient Education [employer].

## 2015-02-13 ENCOUNTER — Telehealth: Payer: Self-pay | Admitting: Family Medicine

## 2015-02-13 ENCOUNTER — Encounter: Payer: Self-pay | Admitting: Neurology

## 2015-02-13 ENCOUNTER — Ambulatory Visit (INDEPENDENT_AMBULATORY_CARE_PROVIDER_SITE_OTHER): Payer: Medicare Other | Admitting: Neurology

## 2015-02-13 VITALS — BP 120/90 | HR 61 | Ht 61.0 in | Wt 240.0 lb

## 2015-02-13 DIAGNOSIS — R2681 Unsteadiness on feet: Secondary | ICD-10-CM

## 2015-02-13 DIAGNOSIS — G250 Essential tremor: Secondary | ICD-10-CM

## 2015-02-13 NOTE — Telephone Encounter (Signed)
Caller name:Stephane Relationship to patient:self Can be reached:[phone] Pharmacy:  Reason for call:Patient is returning your call  Please call her back on her cell(the number above) not her husband's cell

## 2015-02-13 NOTE — Progress Notes (Signed)
Subjective:   Amanda Copeland was seen in consultation in the movement disorder clinic at the request of [name], MD.  The evaluation is for tremor.  This patient is accompanied in the office by her spouse who supplements the history.   The patient is a 75 y.o. right handed female with a history of tremor.  The patient reports that she has had tremor in both hands for the last several years.  She went to a neurologist in [place] and was diagnosed with ET and was put on propranolol, 20 mg bid and it helped a lot without side effects.  There is a family hx of tremor in her father but he has PD.  There is no other family hx of tremor    Affected by caffeine:  No. (coffee is only decaf) Affected by alcohol:  unknown Affected by stress:  No. Affected by fatigue:  No. Spills soup if on spoon:  No. (not now that she is on the propranolol) Spills glass of liquid if full:  No. Affects ADL's (tying shoes, brushing teeth, etc):  No.  Current/Previously tried tremor medications: propranolol  Current medications that may exacerbate tremor:  Albuterol (doesn't notice that it affects tremor); symbicort  Outside reports reviewed: historical medical records.  Allergies  Allergen Reactions  . Statins Other (See Comments)    Per reports muscle aches and joint pain  . Amoxicillin Rash  . Ampicillin Rash  . Penicillins Rash    Outpatient Encounter Prescriptions as of 02/13/2015  Medication Sig  . albuterol (PROVENTIL HFA;VENTOLIN HFA) 108 (90 BASE) MCG/ACT inhaler Inhale 2 puffs into the lungs every 6 (six) hours as needed for wheezing or shortness of breath.  Marland Kitchen aspirin 81 MG tablet Take 81 mg by mouth daily.  . budesonide-formoterol (SYMBICORT) 80-4.5 MCG/ACT inhaler Inhale 2 puffs into the lungs 2 (two) times daily.  . ciclesonide (OMNARIS) 50 MCG/ACT nasal spray Place 2 sprays into both nostrils daily.  . cycloSPORINE (RESTASIS) 0.05 % ophthalmic emulsion Place 1 drop into both eyes daily.  Marland Kitchen  diltiazem (TIAZAC) 180 MG 24 hr capsule Take 1 capsule (180 mg total) by mouth daily.  Marland Kitchen HYDROcodone-homatropine (HYCODAN) 5-1.5 MG/5ML syrup Take 5 mLs by mouth every 8 (eight) hours as needed for cough.  . lansoprazole (PREVACID) 30 MG capsule Take 1 capsule (30 mg total) by mouth 2 (two) times daily.  Marland Kitchen levofloxacin (LEVAQUIN) 250 MG tablet Take 1 tablet (250 mg total) by mouth daily.  . methylPREDNISolone (MEDROL) 4 MG tablet 6 tabs po x 1 d then 5 tabs po x 1 d then 4 tabs po x 1 d then 3 tabs po x 1 d then 2 tabs po x 1 d then 1 tab x 1 day  . Multiple Vitamin (MULTIVITAMIN) tablet Take 1 tablet by mouth daily.  . propranolol (INDERAL) 20 MG tablet Take 20 mg by mouth 2 (two) times daily.  . Red Yeast Rice Extract (RED YEAST RICE PO) Take by mouth daily.  Marland Kitchen triamterene-hydrochlorothiazide (DYAZIDE) 37.5-25 MG per capsule Take 1 each (1 capsule total) by mouth daily.  . [DISCONTINUED] doxycycline (VIBRA-TABS) 100 MG tablet Take 1 tablet (100 mg total) by mouth 2 (two) times daily.   No facility-administered encounter medications on file as of 02/13/2015.    Past Medical History  Diagnosis Date  . Dry eyes   . Benign essential tremor   . Bilateral dry eyes   . Asthma     childhood now returning  . Arthritis   .  Cancer ([place])     Breast  . Obesity 10/28/2014  . Mixed hyperlipidemia 10/28/2014  . History of chicken pox   . H/O measles   . H/O mumps   . Benign essential tremor 10/28/2014  . Back pain affecting pregnancy 11/02/2014  . Lumbago 11/02/2014    Past Surgical History  Procedure Laterality Date  . Mastectomy Right   . Back surgery    . Rotator cuff repair Left   . Replacement total knee bilateral Bilateral   . Breast reduction surgery Left   . Cholecystectomy    . Eye surgery  2008    b/l cataracts removed, in [place] History  . Marital Status: Married    Spouse Name: N/A  . Number of Children: N/A  . Years of Education: N/A    Occupational History  . retired    Social History Main Topics  . Smoking status: Never Smoker   . Smokeless tobacco: Not on file  . Alcohol Use: 0.0 oz/week    0 Standard drinks or equivalent per week     Comment: two times a month  . Drug Use: No  . Sexual Activity: Not on file     Comment: lives with husband, moved [place], no dietary restrictions, retired Education officer, museum   Other Topics Concern  . Not on file   Social History Narrative    Family Status  Relation Status Death Age  . Mother Deceased     heart disease  . Father Deceased     colon cancer, Parkinson's Disease  . Maternal Grandmother Deceased   . Maternal Grandfather Deceased   . Daughter Alive   . Paternal Grandmother Deceased   . Paternal Grandfather Deceased     Review of Systems SOB with asthma.  A complete 10 system ROS was obtained and was negative apart from what is mentioned.   Objective:   VITALS:   Filed Vitals:   02/13/15 0818  BP: 120/90  Pulse: 61  Height: 5\' 1"  (1.549 m)  Weight: 240 lb (108.863 kg)   Gen:  Appears stated age and in NAD. HEENT:  Normocephalic, atraumatic. The mucous membranes are moist. The superficial temporal arteries are without ropiness or tenderness. Cardiovascular: Regular rate and rhythm. Lungs: Clear to auscultation bilaterally. Neck: There are no carotid bruits noted bilaterally.  NEUROLOGICAL:  Orientation:  The patient is alert and oriented x 3.  Recent and remote memory are intact.  Attention span and concentration are normal.  Able to name objects and repeat without trouble.  Fund of knowledge is appropriate Cranial nerves: There is good facial symmetry. The pupils are equal round and reactive to light bilaterally. Fundoscopic exam reveals clear disc margins bilaterally. Extraocular muscles are intact and visual fields are full to confrontational testing. Speech is fluent and clear. Soft palate rises symmetrically and there is no tongue deviation. Hearing is  intact to conversational tone. Tone: Tone is good throughout. Sensation: Sensation is intact to light touch and pinprick throughout (facial, trunk, extremities). Vibration is intact at the bilateral big toe. There is no extinction with double simultaneous stimulation. There is no sensory dermatomal level identified. Coordination:  The patient has no dysdiadichokinesia or dysmetria. Motor: Strength is 5/5 in the bilateral upper and lower extremities.  Shoulder shrug is equal bilaterally.  There is no pronator drift.  There are no fasciculations noted. DTR's: Deep tendon reflexes are 2+/4 at the bilateral biceps, triceps, brachioradialis, patella and achilles.  Plantar responses are downgoing bilaterally. Gait and Station: The patient is able to ambulate without difficulty. The patient is mildly wide-based.  She has difficulty ambulating in a tandem fashion.  She is able to stand in the Romberg position.  MOVEMENT EXAM: Tremor:  There is mild postural tremor, more on the R than the L.  She had no trouble with Archimedes spirals.  She has no trouble pouring a full glass of water from one glass to another.     Assessment/Plan:   1.  Essential Tremor.  -This is evidenced by the symmetrical nature and longstanding hx of gradually getting worse.  She is well controlled on propranolol, 20 mg twice a day.  She has no side effects with the medication.  She will let me know when she needs a refill.  We talked about nature and pathophysiology.  She is happy with her tremor control.  I reassured her that she had no evidence of Parkinson's disease.  She was worried about this because her father had Parkinson's disease. 2.  Mild gait instability.  -Reports that she does have intermittent trouble with balance.  She did have some difficulty ambulating in a tandem fashion, but otherwise her examination was unremarkable.  We talked about balance therapy, but ultimately decided to hold on that.  She will let me know if  she begins to have further trouble and we could explore further. 3.  I will plan on seeing her back in approximately 6 months, sooner should new neurologic issues arise.

## 2015-02-16 DIAGNOSIS — L812 Freckles: Secondary | ICD-10-CM | POA: Diagnosis not present

## 2015-02-16 DIAGNOSIS — L821 Other seborrheic keratosis: Secondary | ICD-10-CM | POA: Diagnosis not present

## 2015-02-16 DIAGNOSIS — L720 Epidermal cyst: Secondary | ICD-10-CM | POA: Diagnosis not present

## 2015-02-16 DIAGNOSIS — L570 Actinic keratosis: Secondary | ICD-10-CM | POA: Diagnosis not present

## 2015-02-22 NOTE — Assessment & Plan Note (Signed)
Persistent symptoms Medrol and Levaquin. CXR unremarkable, WBC up patient encouraged to report worsening symptoms

## 2015-02-22 NOTE — Progress Notes (Signed)
Patient ID: Amanda Copeland, female   DOB: Jun 10, 1940, 75 y.o.   MRN: [id number]   Subjective:    Patient ID: Amanda Copeland, female    DOB: [birth year], 75 y.o.   MRN: [id number]  Chief Complaint  Patient presents with  . Cough    HPI Patient is in today for evaluation of respiratory symptoms she has felt poorly for roughly 4 days. Started with a sore throat, postnasal drip and a dry cough. Cough is now become more productive and congestion as moved into her chest. Chest fatigue, malaise, coughing and shortness of breath with exertion. She notes the cough is so severe it causes chest pain and back pain when she coughs and is keeping her awake. No nausea vomiting or diarrhea. Has some yellow rhinorrhea but no obvious fevers or chills.  Past Medical History  Diagnosis Date  . Dry eyes   . Bilateral dry eyes   . Asthma     childhood now returning  . Arthritis   . Cancer ([place])     Breast  . Obesity 10/28/2014  . Mixed hyperlipidemia 10/28/2014  . History of chicken pox   . H/O measles   . H/O mumps   . Benign essential tremor 10/28/2014  . Back pain affecting pregnancy 11/02/2014  . Lumbago 11/02/2014    Past Surgical History  Procedure Laterality Date  . Mastectomy Right   . Back surgery    . Rotator cuff repair Left   . Replacement total knee bilateral Bilateral   . Breast reduction surgery Left   . Cholecystectomy    . Eye surgery  2008    b/l cataracts removed, in [facility]    Family History  Problem Relation Age of Onset  . Heart disease Mother   . Cancer Father     colon  . Asthma Father   . Parkinson's disease Father   . Cancer Maternal Grandmother     uterine  . Heart disease Maternal Grandfather   . Obesity Daughter   . Appendicitis Paternal Grandmother   . Heart attack Mother   . Heart failure Mother   . Hypertension Mother   . Stroke Neg Hx     Social History   Social History  . Marital Status: Married    Spouse Name: N/A  . Number of Children: N/A  .  Years of Education: N/A   Occupational History  . 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 Smoking status: Never Smoker   . Smokeless tobacco: Not on file  . Alcohol Use: 0.0 oz/week    0 Standard drinks or equivalent per week     Comment: two times a month  . Drug Use: No  . Sexual Activity: Not on file     Comment: lives with husband, moved [place], no dietary restrictions, retired Education officer, museum   Other Topics Concern  . Not on file   Social History Narrative    Outpatient Prescriptions Prior to Visit  Medication Sig Dispense Refill  . albuterol (PROVENTIL HFA;VENTOLIN HFA) 108 (90 BASE) MCG/ACT inhaler Inhale 2 puffs into the lungs every 6 (six) hours as needed for wheezing or shortness of breath. 1 Inhaler 0  . aspirin 81 MG tablet Take 81 mg by mouth daily.    . budesonide-formoterol (SYMBICORT) 80-4.5 MCG/ACT inhaler Inhale 2 puffs into the lungs 2 (two) times daily. 1 Inhaler 11  . ciclesonide (OMNARIS) 50 MCG/ACT nasal spray Place 2 sprays into  both nostrils daily. 12.5 g 6  . cycloSPORINE (RESTASIS) 0.05 % ophthalmic emulsion Place 1 drop into both eyes daily.    Marland Kitchen diltiazem (TIAZAC) 180 MG 24 hr capsule Take 1 capsule (180 mg total) by mouth daily. 90 capsule 2  . lansoprazole (PREVACID) 30 MG capsule Take 1 capsule (30 mg total) by mouth 2 (two) times daily. 60 capsule 6  . Multiple Vitamin (MULTIVITAMIN) tablet Take 1 tablet by mouth daily.    . propranolol (INDERAL) 20 MG tablet Take 20 mg by mouth 2 (two) times daily.    . Red Yeast Rice Extract (RED YEAST RICE PO) Take by mouth daily.    Marland Kitchen triamterene-hydrochlorothiazide (DYAZIDE) 37.5-25 MG per capsule Take 1 each (1 capsule total) by mouth daily. 90 capsule 2  . doxycycline (VIBRA-TABS) 100 MG tablet Take 1 tablet (100 mg total) by mouth 2 (two) times daily. 20 tablet 0  . predniSONE (DELTASONE) 50 MG tablet Take 1 tablet (50 mg total) by mouth daily with breakfast. 5 tablet 0   No  facility-administered medications prior to visit.    Allergies  Allergen Reactions  . Statins Other (See Comments)    Per reports muscle aches and joint pain  . Amoxicillin Rash  . Ampicillin Rash  . Penicillins Rash    Review of Systems  Constitutional: Positive for malaise/fatigue. Negative for fever.  HENT: Positive for sore throat. Negative for congestion.   Eyes: Negative for discharge.  Respiratory: Positive for cough, sputum production, shortness of breath and wheezing. Negative for hemoptysis.   Cardiovascular: Negative for chest pain, palpitations and leg swelling.  Gastrointestinal: Negative for nausea and abdominal pain.  Genitourinary: Negative for dysuria.  Musculoskeletal: Positive for myalgias. Negative for falls.  Skin: Negative for rash.  Neurological: Negative for loss of consciousness and headaches.  Endo/Heme/Allergies: Negative for environmental allergies.  Psychiatric/Behavioral: Negative for depression. The patient is not nervous/anxious.        Objective:    Physical Exam  Constitutional: She is oriented to person, place, and time. She appears well-developed and well-nourished. No distress.  HENT:  Head: Normocephalic and atraumatic.  Nose: Nose normal.  Nasal congestion noted with boggy and erythematous and boggy nasal mucosa.   Eyes: Right eye exhibits no discharge. Left eye exhibits no discharge.  Neck: Normal range of motion. Neck supple.  Cardiovascular: Normal rate and regular rhythm.   No murmur heard. Pulmonary/Chest: Breath sounds normal. She is in respiratory distress. She has no wheezes.  Abdominal: Soft. Bowel sounds are normal. There is no tenderness.  Musculoskeletal: She exhibits no edema.  Neurological: She is alert and oriented to person, place, and time.  Skin: Skin is warm and dry.  Psychiatric: She has a normal mood and affect.  Nursing note and vitals reviewed.   BP 128/80 mmHg  Pulse 66  Temp(Src) 97.9 F (36.6 C)  (Oral)  Ht 5\' 1"  (1.549 m)  Wt 241 lb (109.317 kg)  BMI 45.56 kg/m2  SpO2 95% Wt Readings from Last 3 Encounters:  02/13/15 240 lb (108.863 kg)  02/12/15 241 lb (109.317 kg)  02/03/15 242 lb 12 oz (110.111 kg)     Lab Results  Component Value Date   WBC 13.2* 02/12/2015   HGB 14.5 02/12/2015   HCT 43.8 02/12/2015   PLT 412.0* 02/12/2015   GLUCOSE 90 10/28/2014   CHOL 220* 10/28/2014   TRIG 182.0* 10/28/2014   HDL 63.10 10/28/2014   LDLCALC 120* 10/28/2014   ALT 12 10/28/2014  AST 19 10/28/2014   NA 140 10/28/2014   K 4.1 10/28/2014   CL 102 10/28/2014   CREATININE 0.95 10/28/2014   BUN 20 10/28/2014   CO2 29 10/28/2014   TSH 3.12 10/28/2014    Lab Results  Component Value Date   TSH 3.12 10/28/2014   Lab Results  Component Value Date   WBC 13.2* 02/12/2015   HGB 14.5 02/12/2015   HCT 43.8 02/12/2015   MCV 90.5 02/12/2015   PLT 412.0* 02/12/2015   Lab Results  Component Value Date   NA 140 10/28/2014   K 4.1 10/28/2014   CO2 29 10/28/2014   GLUCOSE 90 10/28/2014   BUN 20 10/28/2014   CREATININE 0.95 10/28/2014   BILITOT 0.3 10/28/2014   ALKPHOS 81 10/28/2014   AST 19 10/28/2014   ALT 12 10/28/2014   PROT 7.7 10/28/2014   ALBUMIN 4.1 10/28/2014   CALCIUM 9.9 10/28/2014   GFR 60.97 10/28/2014   Lab Results  Component Value Date   CHOL 220* 10/28/2014   Lab Results  Component Value Date   HDL 63.10 10/28/2014   Lab Results  Component Value Date   LDLCALC 120* 10/28/2014   Lab Results  Component Value Date   TRIG 182.0* 10/28/2014   Lab Results  Component Value Date   CHOLHDL 3 10/28/2014   No results found for: HGBA1C     Assessment & Plan:   Problem List Items Addressed This Visit    Acute bronchitis    Persistent symptoms Medrol and Levaquin. CXR unremarkable, WBC up patient encouraged to report worsening symptoms      Relevant Medications   levofloxacin (LEVAQUIN) 250 MG tablet   methylPREDNISolone (MEDROL) 4 MG tablet    HYDROcodone-homatropine (HYCODAN) 5-1.5 MG/5ML syrup   Other Relevant Orders   DG Bone Density   CBC w/Diff (Completed)   DG Chest 2 View (Completed)   MM Digital Screening Unilat L   Essential hypertension    Well controlled, no changes to meds. Encouraged heart healthy diet such as the DASH diet and exercise as tolerated.        Other Visit Diagnoses    Osteopenia    -  Primary    Relevant Medications    levofloxacin (LEVAQUIN) 250 MG tablet    methylPREDNISolone (MEDROL) 4 MG tablet    HYDROcodone-homatropine (HYCODAN) 5-1.5 MG/5ML syrup    Other Relevant Orders    DG Bone Density    CBC w/Diff (Completed)    MM Digital Screening Unilat L    Breast cancer screening        Relevant Medications    levofloxacin (LEVAQUIN) 250 MG tablet    methylPREDNISolone (MEDROL) 4 MG tablet    HYDROcodone-homatropine (HYCODAN) 5-1.5 MG/5ML syrup    Other Relevant Orders    DG Bone Density    CBC w/Diff (Completed)    MM Digital Screening Unilat L    Postmenopausal estrogen deficiency        Relevant Medications    levofloxacin (LEVAQUIN) 250 MG tablet    methylPREDNISolone (MEDROL) 4 MG tablet    HYDROcodone-homatropine (HYCODAN) 5-1.5 MG/5ML syrup    Other Relevant Orders    DG Bone Density    CBC w/Diff (Completed)    MM Digital Screening Unilat L       I have discontinued Ms. Reddig predniSONE. I am also having her start on levofloxacin, methylPREDNISolone, and HYDROcodone-homatropine. Additionally, I am having her maintain her propranolol, cycloSPORINE, multivitamin, Red Yeast Rice Extract (RED  YEAST RICE PO), aspirin, triamterene-hydrochlorothiazide, lansoprazole, diltiazem, ciclesonide, budesonide-formoterol, and albuterol.  Meds ordered this encounter  Medications  . levofloxacin (LEVAQUIN) 250 MG tablet    Sig: Take 1 tablet (250 mg total) by mouth daily.    Dispense:  10 tablet    Refill:  0  . methylPREDNISolone (MEDROL) 4 MG tablet    Sig: 6 tabs po x 1 d then 5  tabs po x 1 d then 4 tabs po x 1 d then 3 tabs po x 1 d then 2 tabs po x 1 d then 1 tab x 1 day    Dispense:  21 tablet    Refill:  0  . HYDROcodone-homatropine (HYCODAN) 5-1.5 MG/5ML syrup    Sig: Take 5 mLs by mouth every 8 (eight) hours as needed for cough.    Dispense:  180 mL    Refill:  0     [name], MD

## 2015-02-22 NOTE — Assessment & Plan Note (Signed)
Well controlled, no changes to meds. Encouraged heart healthy diet such as the DASH diet and exercise as tolerated.  °

## 2015-02-23 ENCOUNTER — Ambulatory Visit (HOSPITAL_BASED_OUTPATIENT_CLINIC_OR_DEPARTMENT_OTHER): Payer: Self-pay

## 2015-02-23 ENCOUNTER — Ambulatory Visit (HOSPITAL_BASED_OUTPATIENT_CLINIC_OR_DEPARTMENT_OTHER)
Admission: RE | Admit: 2015-02-23 | Discharge: 2015-02-23 | Disposition: A | Discharge status: Home / Self Care | Payer: Medicare Other | Source: Ambulatory Visit | Attending: Family Medicine | Admitting: Family Medicine

## 2015-02-23 DIAGNOSIS — Z1231 Encounter for screening mammogram for malignant neoplasm of breast: Secondary | ICD-10-CM | POA: Diagnosis not present

## 2015-02-23 DIAGNOSIS — Z780 Asymptomatic menopausal state: Secondary | ICD-10-CM | POA: Insufficient documentation

## 2015-02-23 DIAGNOSIS — J209 Acute bronchitis, unspecified: Secondary | ICD-10-CM

## 2015-02-23 DIAGNOSIS — M8580 Other specified disorders of bone density and structure, unspecified site: Secondary | ICD-10-CM | POA: Insufficient documentation

## 2015-02-23 DIAGNOSIS — Z1239 Encounter for other screening for malignant neoplasm of breast: Secondary | ICD-10-CM | POA: Insufficient documentation

## 2015-02-23 DIAGNOSIS — M85852 Other specified disorders of bone density and structure, left thigh: Secondary | ICD-10-CM | POA: Diagnosis not present

## 2015-03-02 ENCOUNTER — Telehealth: Payer: Self-pay | Admitting: Neurology

## 2015-03-02 MED ORDER — PROPRANOLOL HCL 20 MG PO TABS: 20.0000 mg | ORAL_TABLET | Freq: Two times a day (BID) | ORAL | Status: DC

## 2015-03-02 NOTE — Telephone Encounter (Signed)
Propranolol refill requested. Per last office note- patient to remain on medication. Refill approved and sent to patient's pharmacy.   

## 2015-03-02 NOTE — Telephone Encounter (Signed)
Pt needs a refill on propranolol  20 mg  She takes it 2 daily please call into [facility] on [address] pt cb# [phone]

## 2015-03-30 ENCOUNTER — Encounter: Payer: Self-pay | Admitting: Family

## 2015-03-30 ENCOUNTER — Telehealth: Payer: Self-pay | Admitting: Family

## 2015-03-30 ENCOUNTER — Ambulatory Visit (HOSPITAL_BASED_OUTPATIENT_CLINIC_OR_DEPARTMENT_OTHER)
Admission: RE | Admit: 2015-03-30 | Discharge: 2015-03-30 | Disposition: A | Discharge status: Home / Self Care | Payer: Medicare Other | Source: Ambulatory Visit | Attending: Family | Admitting: Family

## 2015-03-30 ENCOUNTER — Ambulatory Visit (INDEPENDENT_AMBULATORY_CARE_PROVIDER_SITE_OTHER): Payer: Medicare Other | Admitting: Family

## 2015-03-30 VITALS — BP 145/79 | HR 78 | Temp 97.8°F | Resp 18 | Ht 61.0 in | Wt 243.2 lb

## 2015-03-30 DIAGNOSIS — J45909 Unspecified asthma, uncomplicated: Secondary | ICD-10-CM | POA: Diagnosis not present

## 2015-03-30 DIAGNOSIS — R05 Cough: Secondary | ICD-10-CM

## 2015-03-30 DIAGNOSIS — R059 Cough, unspecified: Secondary | ICD-10-CM

## 2015-03-30 MED ORDER — PREDNISONE 10 MG PO TABS: ORAL_TABLET | ORAL | Status: DC

## 2015-03-30 MED ORDER — FLUTICASONE-SALMETEROL 250-50 MCG/DOSE IN AEPB: 1.0000 | INHALATION_SPRAY | Freq: Two times a day (BID) | RESPIRATORY_TRACT | Status: DC

## 2015-03-30 NOTE — Telephone Encounter (Signed)
Notified pt, she would like to proceed with prednisone dose pack now. Please advise?

## 2015-03-30 NOTE — Telephone Encounter (Signed)
Notified pt and she voices understanding. Wants to make sure it is ok to take Advair in addition to her symbicort. Advised pt to proceed as instructed below and I will let her know if there are any changes to below recommendations and if she doesn't receive call back from [place] she should proceed as advised. Pt voices understanding. Please advise?

## 2015-03-30 NOTE — Telephone Encounter (Signed)
Notified pt. 

## 2015-03-30 NOTE — Progress Notes (Signed)
Subjective:    Patient ID: Amanda Copeland, female    DOB: 12-09-1940, 75 y.o.   MRN: [id number]  HPI  Ms. Outman is a 75 yr old female with hx of asthma who presents today with chief complaint of cough and SOB. Reports that she was diagnosed with flu while on cruise and treated with tamiflu.  Thinks that her symptoms started on the 6th or 7th.  Husband had flu and PNA.  She completed 5 days of tamiflu. She reports + chest congestion.  She is using inhaler with minimal improvement in her symptoms.    Review of Systems     Past Medical History  Diagnosis Date  . Dry eyes   . Bilateral dry eyes   . Asthma     childhood now returning  . Arthritis   . Cancer ([place])     Breast  . Obesity 10/28/2014  . Mixed hyperlipidemia 10/28/2014  . History of chicken pox   . H/O measles   . H/O mumps   . Benign essential tremor 10/28/2014  . Back pain affecting pregnancy 11/02/2014  . Lumbago 11/02/2014    Social History   Social History  . Marital Status: Married    Spouse Name: N/A  . Number of Children: N/A  . Years of Education: N/A   Occupational History  . 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 Smoking status: Never Smoker   . Smokeless tobacco: Not on file  . Alcohol Use: 0.0 oz/week    0 Standard drinks or equivalent per week     Comment: two times a month  . Drug Use: No  . Sexual Activity: Not on file     Comment: lives with husband, moved [place], no dietary restrictions, retired Education officer, museum   Other Topics Concern  . Not on file   Social History Narrative    Past Surgical History  Procedure Laterality Date  . Mastectomy Right   . Back surgery    . Rotator cuff repair Left   . Replacement total knee bilateral Bilateral   . Breast reduction surgery Left   . Cholecystectomy    . Eye surgery  2008    b/l cataracts removed, in [facility]    Family History  Problem Relation Age of Onset  . Heart disease Mother   . Cancer Father    colon  . Asthma Father   . Parkinson's disease Father   . Cancer Maternal Grandmother     uterine  . Heart disease Maternal Grandfather   . Obesity Daughter   . Appendicitis Paternal Grandmother   . Heart attack Mother   . Heart failure Mother   . Hypertension Mother   . Stroke Neg Hx     Allergies  Allergen Reactions  . Statins Other (See Comments)    Per reports muscle aches and joint pain  . Amoxicillin Rash  . Ampicillin Rash  . Penicillins Rash    Current Outpatient Prescriptions on File Prior to Visit  Medication Sig Dispense Refill  . albuterol (PROVENTIL HFA;VENTOLIN HFA) 108 (90 BASE) MCG/ACT inhaler Inhale 2 puffs into the lungs every 6 (six) hours as needed for wheezing or shortness of breath. 1 Inhaler 0  . aspirin 81 MG tablet Take 81 mg by mouth daily.    . budesonide-formoterol (SYMBICORT) 80-4.5 MCG/ACT inhaler Inhale 2 puffs into the lungs 2 (two) times daily. 1 Inhaler 11  . ciclesonide (OMNARIS) 50 MCG/ACT  nasal spray Place 2 sprays into both nostrils daily. 12.5 g 6  . cycloSPORINE (RESTASIS) 0.05 % ophthalmic emulsion Place 1 drop into both eyes daily.    Marland Kitchen diltiazem (TIAZAC) 180 MG 24 hr capsule Take 1 capsule (180 mg total) by mouth daily. 90 capsule 2  . lansoprazole (PREVACID) 30 MG capsule Take 1 capsule (30 mg total) by mouth 2 (two) times daily. 60 capsule 6  . Multiple Vitamin (MULTIVITAMIN) tablet Take 1 tablet by mouth daily.    . propranolol (INDERAL) 20 MG tablet Take 1 tablet (20 mg total) by mouth 2 (two) times daily. 60 tablet 5  . Red Yeast Rice Extract (RED YEAST RICE PO) Take by mouth daily.    Marland Kitchen triamterene-hydrochlorothiazide (DYAZIDE) 37.5-25 MG per capsule Take 1 each (1 capsule total) by mouth daily. 90 capsule 2   No current facility-administered medications on file prior to visit.    BP 145/79 mmHg  Pulse 78  Temp(Src) 97.8 F (36.6 C) (Oral)  Resp 18  Ht 5\' 1"  (1.549 m)  Wt 243 lb 3.2 oz (110.315 kg)  BMI 45.98 kg/m2  SpO2  95%    Objective:   Physical Exam  Constitutional: She is oriented to person, place, and time. She appears well-developed and well-nourished.  HENT:  Right Ear: Tympanic membrane and ear canal normal.  Left Ear: Tympanic membrane and ear canal normal.  Dry oral mucosa  Neck: Neck supple. No thyromegaly present.  Cardiovascular: Normal rate, regular rhythm and normal heart sounds.   No murmur heard. Pulmonary/Chest: Effort normal and breath sounds normal. No respiratory distress. She has no wheezes.  Lymphadenopathy:    She has no cervical adenopathy.  Neurological: She is alert and oriented to person, place, and time.  Skin: Skin is warm and dry.  Psychiatric: She has a normal mood and affect. Her behavior is normal. Judgment and thought content normal.          Assessment & Plan:  Viral Bronchitis- CXR is performed and negative for PNA. We did discuss oral prednisone, but she wishes to avoid because it causes her insomnia. She is agreeable to an inhaled steroid.  Will rx with advair- see phone note. Continue albuterol prn. Did discuss adding mucinex prn.  I don't see indication for abx at this time. Pt is instructed to call if symptoms worsen or if symptoms do not improve.

## 2015-03-30 NOTE — Telephone Encounter (Signed)
Please let patient know CXR negative for PNA.  I would like her to try adding advair for a few weeks to see if this helps with her cough/breathing.  Please call if symptoms worsen or do not improve.

## 2015-03-30 NOTE — Telephone Encounter (Signed)
I did not see she was already on symbicort. She should continue symbicort and not begin advair (I cancelled rx).  Continue albuterol Q6hrs.  If her breathing does not improve then I would recommend prednisone- please let me know if she decides she wants prednisone (previously declined).

## 2015-03-30 NOTE — Patient Instructions (Signed)
Please complete chest x ray on the first floor. We will contact you this afternoon with the results and further recommendations.

## 2015-03-30 NOTE — Telephone Encounter (Signed)
rx sent

## 2015-04-21 ENCOUNTER — Encounter: Payer: Self-pay | Admitting: Family Medicine

## 2015-04-21 ENCOUNTER — Ambulatory Visit (HOSPITAL_BASED_OUTPATIENT_CLINIC_OR_DEPARTMENT_OTHER)
Admission: RE | Admit: 2015-04-21 | Discharge: 2015-04-21 | Disposition: A | Discharge status: Home / Self Care | Payer: Medicare Other | Source: Ambulatory Visit | Attending: Family Medicine | Admitting: Family Medicine

## 2015-04-21 ENCOUNTER — Other Ambulatory Visit (INDEPENDENT_AMBULATORY_CARE_PROVIDER_SITE_OTHER): Payer: Medicare Other

## 2015-04-21 ENCOUNTER — Ambulatory Visit (INDEPENDENT_AMBULATORY_CARE_PROVIDER_SITE_OTHER): Payer: Medicare Other | Admitting: Family Medicine

## 2015-04-21 DIAGNOSIS — R739 Hyperglycemia, unspecified: Secondary | ICD-10-CM | POA: Diagnosis not present

## 2015-04-21 DIAGNOSIS — I10 Essential (primary) hypertension: Secondary | ICD-10-CM | POA: Diagnosis not present

## 2015-04-21 DIAGNOSIS — R109 Unspecified abdominal pain: Secondary | ICD-10-CM | POA: Diagnosis not present

## 2015-04-21 DIAGNOSIS — R1030 Lower abdominal pain, unspecified: Secondary | ICD-10-CM | POA: Diagnosis not present

## 2015-04-21 DIAGNOSIS — E782 Mixed hyperlipidemia: Secondary | ICD-10-CM | POA: Diagnosis not present

## 2015-04-21 DIAGNOSIS — K5732 Diverticulitis of large intestine without perforation or abscess without bleeding: Secondary | ICD-10-CM | POA: Diagnosis not present

## 2015-04-21 LAB — COMPREHENSIVE METABOLIC PANEL
ALBUMIN: 3.8 g/dL (ref 3.5–5.2)
ALT: 10 U/L (ref 0–35)
AST: 15 U/L (ref 0–37)
Alkaline Phosphatase: 66 U/L (ref 39–117)
BILIRUBIN TOTAL: 0.5 mg/dL (ref 0.2–1.2)
BUN: 15 mg/dL (ref 6–23)
CALCIUM: 9.4 mg/dL (ref 8.4–10.5)
CO2: 28 mEq/L (ref 19–32)
CREATININE: 0.9 mg/dL (ref 0.40–1.20)
Chloride: 100 mEq/L (ref 96–112)
GFR: 64.81 mL/min (ref >60.00)
Glucose, Bld: 119 mg/dL — ABNORMAL HIGH (ref 70–99)
Potassium: 3.8 mEq/L (ref 3.5–5.1)
Sodium: 138 mEq/L (ref 135–145)
Total Protein: 6.9 g/dL (ref 6.0–8.3)

## 2015-04-21 LAB — CBC WITH DIFFERENTIAL/PLATELET
Basophils Absolute: 0.1 10*3/uL (ref 0.0–0.1)
Basophils Relative: 0.4 % (ref 0.0–3.0)
Eosinophils Absolute: 0.1 10*3/uL (ref 0.0–0.7)
Eosinophils Relative: 0.9 % (ref 0.0–5.0)
HEMATOCRIT: 38.9 % (ref 36.0–46.0)
Hemoglobin: 13.3 g/dL (ref 12.0–15.0)
LYMPHS ABS: 1.8 10*3/uL (ref 0.7–4.0)
LYMPHS PCT: 13.2 % (ref 12.0–46.0)
MCHC: 34.1 g/dL (ref 30.0–36.0)
MCV: 89.4 fl (ref 78.0–100.0)
MONOS PCT: 8.9 % (ref 3.0–12.0)
Monocytes Absolute: 1.2 10*3/uL — ABNORMAL HIGH (ref 0.1–1.0)
NEUTROS ABS: 10.6 10*3/uL — AB (ref 1.4–7.7)
Neutrophils Relative %: 76.6 % (ref 43.0–77.0)
PLATELETS: 351 10*3/uL (ref 150.0–400.0)
RBC: 4.35 Mil/uL (ref 3.87–5.11)
RDW: 14.7 % (ref 11.5–15.5)
WBC: 13.8 10*3/uL — ABNORMAL HIGH (ref 4.0–10.5)

## 2015-04-21 LAB — LIPID PANEL
CHOLESTEROL: 199 mg/dL (ref 0–200)
HDL: 71.5 mg/dL (ref >39.00)
LDL CALC: 108 mg/dL — AB (ref 0–99)
NonHDL: 127.42
TRIGLYCERIDES: 99 mg/dL (ref 0.0–149.0)
Total CHOL/HDL Ratio: 3
VLDL: 19.8 mg/dL (ref 0.0–40.0)

## 2015-04-21 LAB — HEMOGLOBIN A1C: Hgb A1c MFr Bld: 5.9 % (ref 4.6–6.5)

## 2015-04-21 LAB — URINALYSIS, ROUTINE W REFLEX MICROSCOPIC
Bilirubin Urine: NEGATIVE
HGB URINE DIPSTICK: NEGATIVE
Ketones, ur: NEGATIVE
NITRITE: NEGATIVE
RBC / HPF: NONE SEEN (ref 0–?)
Specific Gravity, Urine: 1.01 (ref 1.000–1.030)
Total Protein, Urine: NEGATIVE
URINE GLUCOSE: NEGATIVE
Urobilinogen, UA: 0.2 (ref 0.0–1.0)
pH: 7 (ref 5.0–8.0)

## 2015-04-21 LAB — TSH: TSH: 2.43 u[IU]/mL (ref 0.35–4.50)

## 2015-04-21 MED ORDER — DILTIAZEM HCL ER BEADS 180 MG PO CP24: 180.0000 mg | ORAL_CAPSULE | Freq: Every day | ORAL | Status: DC

## 2015-04-21 MED ORDER — HYDROCODONE-HOMATROPINE 5-1.5 MG/5ML PO SYRP: 5.0000 mL | ORAL_SOLUTION | Freq: Four times a day (QID) | ORAL | Status: DC | PRN

## 2015-04-21 MED ORDER — LANSOPRAZOLE 30 MG PO CPDR
30.0000 mg | DELAYED_RELEASE_CAPSULE | Freq: Two times a day (BID) | ORAL | Status: DC
Start: 2015-04-21 — End: 2016-03-28

## 2015-04-21 MED ORDER — TRIAMTERENE-HCTZ 37.5-25 MG PO CAPS: 1.0000 | ORAL_CAPSULE | Freq: Every day | ORAL | Status: DC

## 2015-04-21 MED ORDER — CIPROFLOXACIN HCL 500 MG PO TABS: 500.0000 mg | ORAL_TABLET | Freq: Two times a day (BID) | ORAL | Status: DC

## 2015-04-21 MED ORDER — METRONIDAZOLE 500 MG PO TABS: 500.0000 mg | ORAL_TABLET | Freq: Three times a day (TID) | ORAL | Status: DC

## 2015-04-21 MED ORDER — IOHEXOL 300 MG/ML  SOLN
100.0000 mL | Freq: Once | INTRAMUSCULAR | Status: AC | PRN
  Administered 2015-04-21: 100 mL via INTRAVENOUS

## 2015-04-21 NOTE — Patient Instructions (Signed)

## 2015-04-21 NOTE — Assessment & Plan Note (Signed)
Cough flared this week. Is given refill on Hydromet. Use Albuterol prn

## 2015-04-21 NOTE — Assessment & Plan Note (Signed)
Well controlled, no changes to meds. Encouraged heart healthy diet such as the DASH diet and exercise as tolerated.  °

## 2015-04-21 NOTE — Assessment & Plan Note (Signed)
Encouraged heart healthy diet, increase exercise, avoid trans fats, consider a krill oil cap daily 

## 2015-04-21 NOTE — Progress Notes (Signed)
Subjective:    Patient ID: Amanda Copeland, female    DOB: 1940-04-26, 75 y.o.   MRN: [id number]  Chief Complaint  Patient presents with  . Abdominal Pain  . Shortness of Breath  . Cough     Abdominal Pain This is a new problem. The current episode started in the past 7 days. The problem occurs intermittently. The pain is located in the LLQ and RLQ. The pain is moderate. The quality of the pain is aching. The abdominal pain radiates to the back. Associated symptoms include nausea. Pertinent negatives include no dysuria, fever, frequency or headaches.  Shortness of Breath Associated symptoms include abdominal pain. Pertinent negatives include no chest pain, fever, headaches, leg swelling or rash.  Cough Associated symptoms include chills and shortness of breath. Pertinent negatives include no chest pain, fever, headaches or rash. There is no history of environmental allergies.   Patient is in today for LLQ stomach pain, worsening in last week, radiating to the back and pain in LLQ and RLQ pain. No constipation, sometimes loose stools, watery more than usual lately, greenish, brownish in color.  Some chills and shaking, fatigue, and pain discomforts during night, but not waking her up during sleep.  Urine has some strong odor, also having some nausea, patient has some shortness of breath and coughing for 2 day with some post nasal drip albuterol has been able to provide some relief. No blood in stool or melena. Denies anorexia. Pain has become more constant the past couple of days  Past Medical History  Diagnosis Date  . Dry eyes   . Bilateral dry eyes   . Asthma     childhood now returning  . Arthritis   . Cancer ([place])     Breast  . Obesity 10/28/2014  . Mixed hyperlipidemia 10/28/2014  . History of chicken pox   . H/O measles   . H/O mumps   . Benign essential tremor 10/28/2014  . Back pain affecting pregnancy 11/02/2014  . Lumbago 11/02/2014  . Essential hypertension, benign  10/28/2014    Past Surgical History  Procedure Laterality Date  . Mastectomy Right   . Back surgery    . Rotator cuff repair Left   . Replacement total knee bilateral Bilateral   . Breast reduction surgery Left   . Cholecystectomy    . Eye surgery  2008    b/l cataracts removed, in [facility]    Family History  Problem Relation Age of Onset  . Heart disease Mother   . Cancer Father     colon  . Asthma Father   . Parkinson's disease Father   . Cancer Maternal Grandmother     uterine  . Heart disease Maternal Grandfather   . Obesity Daughter   . Appendicitis Paternal Grandmother   . Heart attack Mother   . Heart failure Mother   . Hypertension Mother   . Stroke Neg Hx     Social History   Social History  . Marital Status: Married    Spouse Name: N/A  . Number of Children: N/A  . Years of Education: N/A   Occupational History  . 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 Smoking status: Never Smoker   . Smokeless tobacco: Not on file  . Alcohol Use: 0.0 oz/week    0 Standard drinks or equivalent per week     Comment: two times a month  . Drug Use: No  .  Sexual Activity: Not on file     Comment: lives with husband, moved [place], no dietary restrictions, retired Education officer, museum   Other Topics Concern  . Not on file   Social History Narrative    Outpatient Prescriptions Prior to Visit  Medication Sig Dispense Refill  . albuterol (PROVENTIL HFA;VENTOLIN HFA) 108 (90 BASE) MCG/ACT inhaler Inhale 2 puffs into the lungs every 6 (six) hours as needed for wheezing or shortness of breath. 1 Inhaler 0  . aspirin 81 MG tablet Take 81 mg by mouth daily.    . budesonide-formoterol (SYMBICORT) 80-4.5 MCG/ACT inhaler Inhale 2 puffs into the lungs 2 (two) times daily. 1 Inhaler 11  . ciclesonide (OMNARIS) 50 MCG/ACT nasal spray Place 2 sprays into both nostrils daily. 12.5 g 6  . cycloSPORINE (RESTASIS) 0.05 % ophthalmic emulsion Place 1 drop into both  eyes daily.    . Multiple Vitamin (MULTIVITAMIN) tablet Take 1 tablet by mouth daily.    . propranolol (INDERAL) 20 MG tablet Take 1 tablet (20 mg total) by mouth 2 (two) times daily. 60 tablet 5  . Red Yeast Rice Extract (RED YEAST RICE PO) Take by mouth daily.    Marland Kitchen diltiazem (TIAZAC) 180 MG 24 hr capsule Take 1 capsule (180 mg total) by mouth daily. 90 capsule 2  . lansoprazole (PREVACID) 30 MG capsule Take 1 capsule (30 mg total) by mouth 2 (two) times daily. 60 capsule 6  . triamterene-hydrochlorothiazide (DYAZIDE) 37.5-25 MG per capsule Take 1 each (1 capsule total) by mouth daily. 90 capsule 2  . predniSONE (DELTASONE) 10 MG tablet 4 tabs by mouth once daily x 2 days, then 3 tabs daily x 2 days, then 2 tabs daily x 2 days, then 1 tab daily x 2 days 20 tablet 0   No facility-administered medications prior to visit.    Allergies  Allergen Reactions  . Statins Other (See Comments)    Per reports muscle aches and joint pain  . Amoxicillin Rash  . Ampicillin Rash  . Penicillins Rash    Review of Systems  Constitutional: Positive for chills. Negative for fever and malaise/fatigue.  HENT: Negative for congestion.   Eyes: Negative for blurred vision.  Respiratory: Positive for cough and shortness of breath.   Cardiovascular: Negative for chest pain, palpitations and leg swelling.  Gastrointestinal: Positive for nausea and abdominal pain. Negative for blood in stool.  Genitourinary: Negative for dysuria and frequency.  Musculoskeletal: Negative for falls.  Skin: Negative for rash.  Neurological: Negative for dizziness, loss of consciousness and headaches.  Endo/Heme/Allergies: Negative for environmental allergies.  Psychiatric/Behavioral: Negative for depression. The patient is not nervous/anxious.        Objective:    Physical Exam  Constitutional: She is oriented to person, place, and time. She appears well-developed and well-nourished. No distress.  HENT:  Head:  Normocephalic and atraumatic.  Eyes: Conjunctivae are normal.  Neck: Neck supple. No thyromegaly present.  Cardiovascular: Normal rate, regular rhythm and normal heart sounds.   No murmur heard. Pulmonary/Chest: Effort normal and breath sounds normal. No respiratory distress.  Abdominal: Soft. Bowel sounds are normal. She exhibits no distension and no mass. There is tenderness. There is no rebound and no guarding.  Musculoskeletal: She exhibits no edema.  Lymphadenopathy:    She has no cervical adenopathy.  Neurological: She is alert and oriented to person, place, and time.  Skin: Skin is warm and dry.  Psychiatric: She has a normal mood and affect. Her behavior is normal.  BP 120/82 mmHg  Pulse 79  Temp(Src) 98 F (36.7 C) (Oral)  Ht 5\' 1"  (1.549 m)  Wt 239 lb (108.41 kg)  BMI 45.18 kg/m2  SpO2 96% Wt Readings from Last 3 Encounters:  04/21/15 239 lb (108.41 kg)  03/30/15 243 lb 3.2 oz (110.315 kg)  02/13/15 240 lb (108.863 kg)     Lab Results  Component Value Date   WBC 13.8* 04/21/2015   HGB 13.3 04/21/2015   HCT 38.9 04/21/2015   PLT 351.0 04/21/2015   GLUCOSE 119* 04/21/2015   CHOL 199 04/21/2015   TRIG 99.0 04/21/2015   HDL 71.50 04/21/2015   LDLCALC 108* 04/21/2015   ALT 10 04/21/2015   AST 15 04/21/2015   NA 138 04/21/2015   K 3.8 04/21/2015   CL 100 04/21/2015   CREATININE 0.90 04/21/2015   BUN 15 04/21/2015   CO2 28 04/21/2015   TSH 2.43 04/21/2015   HGBA1C 5.9 04/21/2015    Lab Results  Component Value Date   TSH 2.43 04/21/2015   Lab Results  Component Value Date   WBC 13.8* 04/21/2015   HGB 13.3 04/21/2015   HCT 38.9 04/21/2015   MCV 89.4 04/21/2015   PLT 351.0 04/21/2015   Lab Results  Component Value Date   NA 138 04/21/2015   K 3.8 04/21/2015   CO2 28 04/21/2015   GLUCOSE 119* 04/21/2015   BUN 15 04/21/2015   CREATININE 0.90 04/21/2015   BILITOT 0.5 04/21/2015   ALKPHOS 66 04/21/2015   AST 15 04/21/2015   ALT 10 04/21/2015     PROT 6.9 04/21/2015   ALBUMIN 3.8 04/21/2015   CALCIUM 9.4 04/21/2015   GFR 64.81 04/21/2015   Lab Results  Component Value Date   CHOL 199 04/21/2015   Lab Results  Component Value Date   HDL 71.50 04/21/2015   Lab Results  Component Value Date   LDLCALC 108* 04/21/2015   Lab Results  Component Value Date   TRIG 99.0 04/21/2015   Lab Results  Component Value Date   CHOLHDL 3 04/21/2015   Lab Results  Component Value Date   HGBA1C 5.9 04/21/2015       Assessment & Plan:   Problem List Items Addressed This Visit    Essential hypertension, benign    Well controlled, no changes to meds. Encouraged heart healthy diet such as the DASH diet and exercise as tolerated.       Relevant Medications   diltiazem (TIAZAC) 180 MG 24 hr capsule   triamterene-hydrochlorothiazide (DYAZIDE) 37.5-25 MG capsule   Lower abdominal pain    Stat CMP and CT scan to rule out Diverticulitis and patient at risk for perforation. Started on NOW probiotics and rest the GI track with clear fluids and simple carbs once results returned. CT confirms diverticulitis, started on Cipro and Flagyl.       Relevant Orders   Comprehensive metabolic panel (Completed)   Lipid panel (Completed)   CBC with Differential/Platelet (Completed)   TSH (Completed)   Urinalysis   Urine culture   CT Abdomen Pelvis W Contrast (Completed)   Mixed hyperlipidemia    Encouraged heart healthy diet, increase exercise, avoid trans fats, consider a krill oil cap daily      Relevant Medications   diltiazem (TIAZAC) 180 MG 24 hr capsule   triamterene-hydrochlorothiazide (DYAZIDE) 37.5-25 MG capsule   Other Relevant Orders   Comprehensive metabolic panel (Completed)   Lipid panel (Completed)   CBC with Differential/Platelet (Completed)   TSH (  Completed)   Urinalysis   Urine culture      I have discontinued Ms. Salmeron predniSONE. I have also changed her triamterene-hydrochlorothiazide, metroNIDAZOLE, and  ciprofloxacin. Additionally, I am having her start on HYDROcodone-homatropine. Lastly, I am having her maintain her cycloSPORINE, multivitamin, Red Yeast Rice Extract (RED YEAST RICE PO), aspirin, ciclesonide, budesonide-formoterol, albuterol, propranolol, diltiazem, and lansoprazole.  Meds ordered this encounter  Medications  . diltiazem (TIAZAC) 180 MG 24 hr capsule    Sig: Take 1 capsule (180 mg total) by mouth daily.    Dispense:  90 capsule    Refill:  2  . triamterene-hydrochlorothiazide (DYAZIDE) 37.5-25 MG capsule    Sig: Take 1 each (1 capsule total) by mouth daily.    Dispense:  90 capsule    Refill:  2  . lansoprazole (PREVACID) 30 MG capsule    Sig: Take 1 capsule (30 mg total) by mouth 2 (two) times daily.    Dispense:  60 capsule    Refill:  6  . HYDROcodone-homatropine (HYCODAN) 5-1.5 MG/5ML syrup    Sig: Take 5 mLs by mouth every 6 (six) hours as needed for cough.    Dispense:  120 mL    Refill:  0  . DISCONTD: ciprofloxacin (CIPRO) 500 MG tablet    Sig: Take 500 mg by mouth 2 (two) times daily. Take for 10 days  . DISCONTD: metroNIDAZOLE (FLAGYL) 500 MG tablet    Sig: Take 500 mg by mouth 3 (three) times daily. Take for 10 days.  . metroNIDAZOLE (FLAGYL) 500 MG tablet    Sig: Take 1 tablet (500 mg total) by mouth 3 (three) times daily. Take for 10 days.    Dispense:  30 tablet    Refill:  0  . ciprofloxacin (CIPRO) 500 MG tablet    Sig: Take 1 tablet (500 mg total) by mouth 2 (two) times daily. Take for 10 days    Dispense:  20 tablet    Refill:  0     [name], MD

## 2015-04-21 NOTE — Assessment & Plan Note (Addendum)
Stat CMP and CT scan to rule out Diverticulitis and patient at risk for perforation. Started on NOW probiotics and rest the GI track with clear fluids and simple carbs once results returned. CT confirms diverticulitis, started on Cipro and Flagyl.

## 2015-04-21 NOTE — Progress Notes (Signed)
Pre visit review using our clinic review tool, if applicable. No additional management support is needed unless otherwise documented below in the visit note. 

## 2015-04-23 LAB — URINE CULTURE: Colony Count: 50000

## 2015-04-27 ENCOUNTER — Ambulatory Visit: Payer: Self-pay | Admitting: Family Medicine

## 2015-05-07 ENCOUNTER — Other Ambulatory Visit: Payer: Self-pay | Admitting: Family Medicine

## 2015-05-07 DIAGNOSIS — J209 Acute bronchitis, unspecified: Secondary | ICD-10-CM

## 2015-05-07 MED ORDER — ALBUTEROL SULFATE HFA 108 (90 BASE) MCG/ACT IN AERS: 2.0000 | INHALATION_SPRAY | Freq: Four times a day (QID) | RESPIRATORY_TRACT | Status: DC | PRN

## 2015-06-09 DIAGNOSIS — H01002 Unspecified blepharitis right lower eyelid: Secondary | ICD-10-CM | POA: Diagnosis not present

## 2015-06-09 DIAGNOSIS — H01001 Unspecified blepharitis right upper eyelid: Secondary | ICD-10-CM | POA: Diagnosis not present

## 2015-06-09 DIAGNOSIS — H01004 Unspecified blepharitis left upper eyelid: Secondary | ICD-10-CM | POA: Diagnosis not present

## 2015-06-09 DIAGNOSIS — H01005 Unspecified blepharitis left lower eyelid: Secondary | ICD-10-CM | POA: Diagnosis not present

## 2015-06-09 DIAGNOSIS — Z961 Presence of intraocular lens: Secondary | ICD-10-CM | POA: Diagnosis not present

## 2015-06-09 DIAGNOSIS — H04123 Dry eye syndrome of bilateral lacrimal glands: Secondary | ICD-10-CM | POA: Diagnosis not present

## 2015-06-09 DIAGNOSIS — H524 Presbyopia: Secondary | ICD-10-CM | POA: Diagnosis not present

## 2015-06-15 ENCOUNTER — Other Ambulatory Visit: Payer: Self-pay | Admitting: Neurology

## 2015-06-17 ENCOUNTER — Ambulatory Visit (INDEPENDENT_AMBULATORY_CARE_PROVIDER_SITE_OTHER): Payer: Medicare Other | Admitting: Medical

## 2015-06-17 ENCOUNTER — Encounter: Payer: Self-pay | Admitting: Medical

## 2015-06-17 VITALS — BP 122/84 | HR 85 | Temp 97.8°F | Ht 61.0 in | Wt 242.6 lb

## 2015-06-17 DIAGNOSIS — R062 Wheezing: Secondary | ICD-10-CM | POA: Diagnosis not present

## 2015-06-17 DIAGNOSIS — R05 Cough: Secondary | ICD-10-CM

## 2015-06-17 DIAGNOSIS — J029 Acute pharyngitis, unspecified: Secondary | ICD-10-CM

## 2015-06-17 DIAGNOSIS — J209 Acute bronchitis, unspecified: Secondary | ICD-10-CM | POA: Diagnosis not present

## 2015-06-17 DIAGNOSIS — R059 Cough, unspecified: Secondary | ICD-10-CM

## 2015-06-17 LAB — POCT RAPID STREP A (OFFICE): Rapid Strep A Screen: NEGATIVE

## 2015-06-17 MED ORDER — PREDNISONE 10 MG PO TABS: ORAL_TABLET | ORAL | Status: DC

## 2015-06-17 MED ORDER — AZITHROMYCIN 250 MG PO TABS: ORAL_TABLET | ORAL | Status: DC

## 2015-06-17 MED ORDER — HYDROCODONE-HOMATROPINE 5-1.5 MG/5ML PO SYRP: 5.0000 mL | ORAL_SOLUTION | Freq: Three times a day (TID) | ORAL | Status: DC | PRN

## 2015-06-17 NOTE — Progress Notes (Signed)
Subjective:    Patient ID: Amanda Copeland, female    DOB: Apr 03, 1940, 75 y.o.   MRN: [id number]  HPI  Pt in feeling sick for 2 days. Moderate to severe st. Pt met friends over weekend who were sick. Close quarter exposure to friends.  Pt states no nasal congestion.  Pt fells chest congestion and some wheezing x 2 days(faint mild presently). Pt states gets bronchitis easily. Pt not bringing up mucus. Occasional rattle feel to chest.  Pt is on symbicort and albuterol.(basline asthma hx)     Review of Systems  Constitutional: Negative for fever, chills and fatigue.  HENT: Positive for sore throat. Negative for congestion, postnasal drip and rhinorrhea.   Respiratory: Positive for cough and wheezing. Negative for chest tightness and shortness of breath.   Cardiovascular: Negative for chest pain and palpitations.  Gastrointestinal: Negative for abdominal pain.  Musculoskeletal: Negative for myalgias and back pain.  Neurological: Negative for headaches.  Hematological: Negative for adenopathy.  Psychiatric/Behavioral: Negative for behavioral problems and confusion.   Past Medical History  Diagnosis Date  . Dry eyes   . Bilateral dry eyes   . Asthma     childhood now returning  . Arthritis   . Cancer ([place])     Breast  . Obesity 10/28/2014  . Mixed hyperlipidemia 10/28/2014  . History of chicken pox   . H/O measles   . H/O mumps   . Benign essential tremor 10/28/2014  . Back pain affecting pregnancy 11/02/2014  . Lumbago 11/02/2014  . Essential hypertension, benign 10/28/2014     Social History   Social History  . Marital Status: Married    Spouse Name: N/A  . Number of Children: N/A  . Years of Education: N/A   Occupational History  . 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 Smoking status: Never Smoker   . Smokeless tobacco: Not on file  . Alcohol Use: 0.0 oz/week    0 Standard drinks or equivalent per week     Comment: two times a month   . Drug Use: No  . Sexual Activity: Not on file     Comment: lives with husband, moved [place], no dietary restrictions, retired Education officer, museum   Other Topics Concern  . Not on file   Social History Narrative    Past Surgical History  Procedure Laterality Date  . Mastectomy Right   . Back surgery    . Rotator cuff repair Left   . Replacement total knee bilateral Bilateral   . Breast reduction surgery Left   . Cholecystectomy    . Eye surgery  2008    b/l cataracts removed, in [facility]    Family History  Problem Relation Age of Onset  . Heart disease Mother   . Cancer Father     colon  . Asthma Father   . Parkinson's disease Father   . Cancer Maternal Grandmother     uterine  . Heart disease Maternal Grandfather   . Obesity Daughter   . Appendicitis Paternal Grandmother   . Heart attack Mother   . Heart failure Mother   . Hypertension Mother   . Stroke Neg Hx     Allergies  Allergen Reactions  . Statins Other (See Comments)    Per reports muscle aches and joint pain  . Amoxicillin Rash  . Ampicillin Rash  . Penicillins Rash    Current Outpatient Prescriptions on File Prior to Visit  Medication Sig Dispense Refill  . albuterol (PROVENTIL HFA;VENTOLIN HFA) 108 (90 Base) MCG/ACT inhaler Inhale 2 puffs into the lungs every 6 (six) hours as needed for wheezing or shortness of breath. 1 Inhaler 0  . aspirin 81 MG tablet Take 81 mg by mouth daily.    . budesonide-formoterol (SYMBICORT) 80-4.5 MCG/ACT inhaler Inhale 2 puffs into the lungs 2 (two) times daily. 1 Inhaler 11  . ciclesonide (OMNARIS) 50 MCG/ACT nasal spray Place 2 sprays into both nostrils daily. 12.5 g 6  . cycloSPORINE (RESTASIS) 0.05 % ophthalmic emulsion Place 1 drop into both eyes daily.    Marland Kitchen diltiazem (TIAZAC) 180 MG 24 hr capsule Take 1 capsule (180 mg total) by mouth daily. 90 capsule 2  . lansoprazole (PREVACID) 30 MG capsule Take 1 capsule (30 mg total) by mouth 2 (two) times daily. 60 capsule 6  .  Multiple Vitamin (MULTIVITAMIN) tablet Take 1 tablet by mouth daily.    . propranolol (INDERAL) 20 MG tablet TAKE 1 TABLET BY MOUTH TWICE A DAY 60 tablet 1  . Red Yeast Rice Extract (RED YEAST RICE PO) Take by mouth daily.    Marland Kitchen triamterene-hydrochlorothiazide (DYAZIDE) 37.5-25 MG capsule Take 1 each (1 capsule total) by mouth daily. 90 capsule 2   No current facility-administered medications on file prior to visit.    BP 122/84 mmHg  Pulse 85  Temp(Src) 97.8 F (36.6 C) (Oral)  Ht _0  (1.549 m)  Wt 242 lb 9.6 oz (110.043 kg)  BMI 45.86 kg/m2  SpO2 94%       Objective:   Physical Exam  General  Mental Status - Alert. General Appearance - Well groomed. Not in acute distress.  Skin Rashes- No Rashes.  HEENT Head- Normal. Ear Auditory Canal - Left- Normal. Right - Normal.Tympanic Membrane- Left- Normal. Right- Normal. Eye Sclera/Conjunctiva- Left- Normal. Right- Normal. Nose & Sinuses Nasal Mucosa- Left-  Boggy and Congested. Right-  Boggy and  Congested.Bilateral no  maxillary and no  frontal sinus pressure. Mouth & Throat Lips: Upper Lip- Normal: no dryness, cracking, pallor, cyanosis, or vesicular eruption. Lower Lip-Normal: no dryness, cracking, pallor, cyanosis or vesicular eruption. Buccal Mucosa- Bilateral- No Aphthous ulcers. Oropharynx- No Discharge or Erythema. Tonsils: Characteristics- Bilateral- No Erythema or Congestion. Size/Enlargement- Bilateral- No enlargement. Discharge- bilateral-None.  Neck Neck- Supple. No Masses.   Chest and Lung Exam Auscultation: Breath Sounds:- even and unlabored. But shallow  Cardiovascular Auscultation:Rythm- Regular, rate and rhythm. Murmurs & Other Heart Sounds:Ausculatation of the heart reveal- No Murmurs.  Lymphatic Head & Neck General Head & Neck Lymphatics: Bilateral: Description- No Localized lymphadenopathy.       Assessment & Plan:  For bronchitis  will rx azithromycin.  For cough will rx  hycodan.  For wheezing continue inhalers. If wheezing worsens despite inhalers then start tapered prednisone.  Your strep test was negative.  Follow up  7-10 days or as needed.

## 2015-06-17 NOTE — Progress Notes (Signed)
Pre visit review using our clinic review tool, if applicable. No additional management support is needed unless otherwise documented below in the visit note. 

## 2015-06-17 NOTE — Patient Instructions (Addendum)
For bronchitis  will rx azithromycin.   For cough will rx hycodan.  For wheezing continue inhalers. If wheezing worsens despite inhalers then start tapered prednisone.  Your strep test was negative  Follow up  7-10 days or as needed.

## 2015-06-22 ENCOUNTER — Ambulatory Visit (HOSPITAL_BASED_OUTPATIENT_CLINIC_OR_DEPARTMENT_OTHER)
Admission: RE | Admit: 2015-06-22 | Discharge: 2015-06-22 | Disposition: A | Discharge status: Home / Self Care | Payer: Medicare Other | Source: Ambulatory Visit | Attending: Family Medicine | Admitting: Family Medicine

## 2015-06-22 ENCOUNTER — Encounter (HOSPITAL_BASED_OUTPATIENT_CLINIC_OR_DEPARTMENT_OTHER): Payer: Self-pay

## 2015-06-22 ENCOUNTER — Encounter: Payer: Self-pay | Admitting: Family Medicine

## 2015-06-22 ENCOUNTER — Telehealth: Payer: Self-pay

## 2015-06-22 ENCOUNTER — Ambulatory Visit (INDEPENDENT_AMBULATORY_CARE_PROVIDER_SITE_OTHER): Payer: Medicare Other | Admitting: Family Medicine

## 2015-06-22 ENCOUNTER — Encounter (HOSPITAL_COMMUNITY): Payer: Self-pay

## 2015-06-22 ENCOUNTER — Ambulatory Visit (HOSPITAL_COMMUNITY)
Admission: RE | Admit: 2015-06-22 | Discharge: 2015-06-22 | Disposition: A | Discharge status: Home / Self Care | Payer: Medicare Other | Source: Ambulatory Visit | Attending: Family Medicine | Admitting: Family Medicine

## 2015-06-22 VITALS — BP 154/81 | HR 69 | Temp 97.0°F | Ht 61.0 in | Wt 240.0 lb

## 2015-06-22 DIAGNOSIS — R918 Other nonspecific abnormal finding of lung field: Secondary | ICD-10-CM | POA: Insufficient documentation

## 2015-06-22 DIAGNOSIS — R0602 Shortness of breath: Secondary | ICD-10-CM

## 2015-06-22 DIAGNOSIS — R911 Solitary pulmonary nodule: Secondary | ICD-10-CM

## 2015-06-22 DIAGNOSIS — R062 Wheezing: Secondary | ICD-10-CM | POA: Insufficient documentation

## 2015-06-22 DIAGNOSIS — I700 Atherosclerosis of aorta: Secondary | ICD-10-CM | POA: Diagnosis not present

## 2015-06-22 LAB — POCT I-STAT CREATININE: Creatinine, Ser: 1.1 mg/dL — ABNORMAL HIGH (ref 0.44–1.00)

## 2015-06-22 LAB — D-DIMER, QUANTITATIVE (NOT AT ARMC): D DIMER QUANT: 1.28 ug{FEU}/mL — AB (ref 0.00–0.48)

## 2015-06-22 LAB — TROPONIN I: TNIDX: 0.03 ug/l (ref 0.00–0.06)

## 2015-06-22 MED ORDER — IPRATROPIUM BROMIDE 0.02 % IN SOLN: 0.5000 mg | Freq: Once | RESPIRATORY_TRACT | Status: DC

## 2015-06-22 MED ORDER — IOPAMIDOL (ISOVUE-370) INJECTION 76%
100.0000 mL | Freq: Once | INTRAVENOUS | Status: AC | PRN
  Administered 2015-06-22: 100 mL via INTRAVENOUS

## 2015-06-22 MED ORDER — ALBUTEROL SULFATE (2.5 MG/3ML) 0.083% IN NEBU: 2.5000 mg | INHALATION_SOLUTION | Freq: Once | RESPIRATORY_TRACT | Status: DC

## 2015-06-22 NOTE — Progress Notes (Addendum)
[place] [facility] [address], [place], [place] [address] [phone]  Date:  06/22/2015   Name:  Amanda Copeland   DOB:  1940-06-25   MRN:  [id number]  PCP:  [name], MD    Chief Complaint: Cough   History of Present Illness:  Amanda Copeland is a 75 y.o. very pleasant female patient who presents with the following:  Seen here 5 days ago- history of asthma, dx with bronchitis. Started on azithromycin, hycodan for cough. Continued baseline inhalers (albuterol, symbicort). Given an rx for prednisone taper but has not started yet.  She does not like the SE she gets from prednisone (does not sleep well) so has been reluctant to start this.    She is here today because she is still feeling very SOB and is coughing a lot. She will cough more when she lays down or talks.   She may wheeze some of the time.   She has not noted any fever She is using symbicort BID and her albuterol prn over the last couple of days- she is not sure if it helped her much  She does not have glaucoma.  No history of DM.  Never had a blood clot or PE  No chest pressure or pain.  Never had CHF Never a smoker Does not have DM  Lab Results  Component Value Date   HGBA1C 5.9 04/21/2015     Patient Active Problem List   Diagnosis Date Noted  . Lower abdominal pain 04/21/2015  . Lumbago 11/02/2014  . Obesity 10/28/2014  . Esophageal reflux 10/28/2014  . Essential hypertension, benign 10/28/2014  . Mixed hyperlipidemia 10/28/2014  . Benign essential tremor 10/28/2014  . Bilateral dry eyes   . Asthma   . Arthritis   . Cancer ([place])   . History of chicken pox     Past Medical History  Diagnosis Date  . Dry eyes   . Bilateral dry eyes   . Asthma     childhood now returning  . Arthritis   . Cancer ([place])     Breast  . Obesity 10/28/2014  . Mixed hyperlipidemia 10/28/2014  . History of chicken pox   . H/O measles   . H/O mumps   . Benign  essential tremor 10/28/2014  . Back pain affecting pregnancy 11/02/2014  . Lumbago 11/02/2014  . Essential hypertension, benign 10/28/2014    Past Surgical History  Procedure Laterality Date  . Mastectomy Right   . Back surgery    . Rotator cuff repair Left   . Replacement total knee bilateral Bilateral   . Breast reduction surgery Left   . Cholecystectomy    . Eye surgery  2008    b/l cataracts removed, in [place] History  Substance Use Topics  . Smoking status: Never Smoker   . Smokeless tobacco: None  . Alcohol Use: 0.0 oz/week    0 Standard drinks or equivalent per week     Comment: two times a month    Family History  Problem Relation Age of Onset  . Heart disease Mother   . Cancer Father     colon  . Asthma Father   . Parkinson's disease Father   . Cancer Maternal Grandmother     uterine  . Heart disease Maternal Grandfather   . Obesity Daughter   . Appendicitis Paternal Grandmother   . Heart attack Mother   .  Heart failure Mother   . Hypertension Mother   . Stroke Neg Hx     Allergies  Allergen Reactions  . Statins Other (See Comments)    Per reports muscle aches and joint pain  . Amoxicillin Rash  . Ampicillin Rash  . Penicillins Rash    Medication list has been reviewed and updated.  Current Outpatient Prescriptions on File Prior to Visit  Medication Sig Dispense Refill  . albuterol (PROVENTIL HFA;VENTOLIN HFA) 108 (90 Base) MCG/ACT inhaler Inhale 2 puffs into the lungs every 6 (six) hours as needed for wheezing or shortness of breath. 1 Inhaler 0  . aspirin 81 MG tablet Take 81 mg by mouth daily.    . budesonide-formoterol (SYMBICORT) 80-4.5 MCG/ACT inhaler Inhale 2 puffs into the lungs 2 (two) times daily. 1 Inhaler 11  . ciclesonide (OMNARIS) 50 MCG/ACT nasal spray Place 2 sprays into both nostrils daily. 12.5 g 6  . diltiazem (TIAZAC) 180 MG 24 hr capsule Take 1 capsule (180 mg total) by mouth daily. 90 capsule 2  .  HYDROcodone-homatropine (HYCODAN) 5-1.5 MG/5ML syrup Take 5 mLs by mouth every 8 (eight) hours as needed for cough. 120 mL 0  . lansoprazole (PREVACID) 30 MG capsule Take 1 capsule (30 mg total) by mouth 2 (two) times daily. 60 capsule 6  . Multiple Vitamin (MULTIVITAMIN) tablet Take 1 tablet by mouth daily.    . propranolol (INDERAL) 20 MG tablet TAKE 1 TABLET BY MOUTH TWICE A DAY 60 tablet 1  . Red Yeast Rice Extract (RED YEAST RICE PO) Take by mouth daily.    Marland Kitchen triamterene-hydrochlorothiazide (DYAZIDE) 37.5-25 MG capsule Take 1 each (1 capsule total) by mouth daily. 90 capsule 2  . cycloSPORINE (RESTASIS) 0.05 % ophthalmic emulsion Place 1 drop into both eyes daily. Reported on 06/22/2015    . predniSONE (DELTASONE) 10 MG tablet 5 tab po day 1, 4 tab po day 2,3 tab po day 3, 2 tab po day 4, 1 tab po day 5. (Patient not taking: Reported on 06/22/2015) 15 tablet 0   No current facility-administered medications on file prior to visit.    Review of Systems:  As per HPI- otherwise negative.   Physical Examination: Filed Vitals:   06/22/15 1131  BP: 154/81  Pulse: 69  Temp: 97 F (36.1 C)   Filed Vitals:   06/22/15 1131  Height: 5\' 1"  (1.549 m)  Weight: 240 lb (108.863 kg)   Body mass index is 45.37 kg/(m^2). Ideal Body Weight: Weight in (lb) to have BMI = 25: 132  GEN: WDWN, NAD, Non-toxic, A & O x 3, obese, looks well.  Here today with her husband HEENT: Atraumatic, Normocephalic. Neck supple. No masses, No LAD. Ears and Nose: No external deformity. CV: RRR, No M/G/R. No JVD. No thrill. No extra heart sounds. PULM: CTA B, no wheezes, crackles, rhonchi. No retractions. No resp. distress. No accessory muscle use. EXTR: No c/c/e NEURO Normal gait.  PSYCH: Normally interactive. Conversant. Not depressed or anxious appearing.  Calm demeanor.   Duoneb: helped reduce her SOB at rest somewhat.  Walked afterwards and she still felt SOB  They did travel a couple of week ago to  [place].  They have some upcoming trips to [place], [place] planned Never had a DVT or PE  Assessment and Plan: Shortness of breath - Plan: albuterol (PROVENTIL) (2.5 MG/3ML) 0.083% nebulizer solution 2.5 mg, ipratropium (ATROVENT) nebulizer solution 0.5 mg, D-Dimer, Quantitative, Troponin I, DG Chest 2 View  Wheezing -  Plan: albuterol (PROVENTIL) (2.5 MG/3ML) 0.083% nebulizer solution 2.5 mg, ipratropium (ATROVENT) nebulizer solution 0.5 mg, DG Chest 2 View  Seen here 5 days ago and treated with azithromycin.  Here today with persistent SOB.   Concern for CAD or PE- will order D dimer and Troponin, CXR Asked her to start on her prednisone and use the albuterol as needed  Signed Lamar Blinks, MD  Received her labs and CXR- gave her a call.  D dimer is high and her CXR is abnormal.  Will get a CT chest today, she is agreeable.  Received CT chest around 7:15 pm and let her know it is negative for PE but shows likely pneumonia, do recommend repeat in one month.  She understands  Results for orders placed or performed in visit on 06/22/15  D-Dimer, Quantitative  Result Value Ref Range   D-Dimer, Quant 1.28 (H) 0.00 - 0.48 ug/mL-FEU  Troponin I  Result Value Ref Range   TNIDX 0.03 0.00 - 0.06 ug/l   Dg Chest 2 View  06/22/2015  CLINICAL DATA:  Asthma and shortness of breath. History of breast cancer. EXAM: CHEST  2 VIEW COMPARISON:  Multiple exams, including 03/30/2015 and 04/21/2015 FINDINGS: Atherosclerotic aortic arch. Thoracic spondylosis. No pleural effusion. Indistinctly marginated 1.7 by 1.1 cm oval-shaped nodular density at the right lung base. IMPRESSION: 1. 1.7 by 1.1 cm nodular density at the right lung base. Intrapulmonary nodule/malignancy not excluded, chest CT recommended (with contrast if feasible). 2. Atherosclerotic aortic arch. Electronically Signed   By: Van Clines M.D.   On: 06/22/2015 12:57   Ct Angio Chest Pe W/cm &/or Wo Cm  06/22/2015  CLINICAL DATA:   Shortness of breath, elevated D-dimer level. EXAM: CT ANGIOGRAPHY CHEST WITH CONTRAST TECHNIQUE: Multidetector CT imaging of the chest was performed using the standard protocol during bolus administration of intravenous contrast. Multiplanar CT image reconstructions and MIPs were obtained to evaluate the vascular anatomy. CONTRAST:  100 mL of Isovue 370 intravenously. COMPARISON:  Chest radiograph of same day. FINDINGS: No pneumothorax or pleural effusion is noted. Multiple ill-defined densities are noted in both lungs, but most prominently seen posteriorly in the right lower lobe. The largest measures 16 x 12 mm in the right lower lobe. These are most consistent with multifocal pneumonia or possibly ground-glass opacities. There is no evidence of pulmonary embolus. There is no evidence of thoracic aortic dissection or aneurysm. Visualized portion of upper abdomen is unremarkable. No significant mediastinal mass or adenopathy is noted. No significant osseous abnormality is noted. Good opacification of pulmonary arteries is noted. Review of the MIP images confirms the above findings. IMPRESSION: No evidence of pulmonary embolus. Multiple ill-defined airspace or ground-glass opacities are noted in both lungs, most prominently seen in right lower lobe. These most likely represent multifocal pneumonia, but followup CT scan in 3-4 weeks after antibiotic therapy trial is recommended to ensure resolution and rule out the possibility of malignancy or metastatic disease. Electronically Signed   By: Marijo Conception, M.D.   On: 06/22/2015 18:02    Called 5/18 to check on her progress- LMOM- please let me know how she is doing

## 2015-06-22 NOTE — Telephone Encounter (Signed)
Patient received lab results from [facility] drawn today. Results High per fax received from Lab. Called centralized scheduling for [facility] who states patient can come now. Will have Creatinin and BUN drawn once she arrives. Order placed  By Dr. Lorelei Pont. Called patient with orders and gave her address to [facility]. Patient agreed to go now. Sent message to referral department  for Prior Authorization request.

## 2015-06-22 NOTE — Patient Instructions (Addendum)
Please go to lab and then downstairs for for your chest x-ray. Then you can go home and I will be in touch with your results If your D dimer is positive we will need to consider a chest CT.  Use your albuterol every 4-6 hours as needed over the next few days for wheezing and shortness of breath Please go ahead and start on the prednisone- you can take it just for 3 days if the side effects are too bothersome

## 2015-06-22 NOTE — Progress Notes (Signed)
Pre visit review using our clinic tool,if applicable. No additional management support is needed unless otherwise documented below in the visit note.  

## 2015-06-25 ENCOUNTER — Encounter: Payer: Self-pay | Admitting: Family Medicine

## 2015-06-25 NOTE — Addendum Note (Signed)
Addended by: Lamar Blinks C on: 06/25/2015 09:26 AM   Modules accepted: Orders

## 2015-07-10 ENCOUNTER — Other Ambulatory Visit: Payer: Medicare Other

## 2015-07-30 ENCOUNTER — Encounter (HOSPITAL_BASED_OUTPATIENT_CLINIC_OR_DEPARTMENT_OTHER): Payer: Self-pay | Admitting: Emergency Medicine

## 2015-07-30 ENCOUNTER — Emergency Department (HOSPITAL_BASED_OUTPATIENT_CLINIC_OR_DEPARTMENT_OTHER)
Admission: EM | Admit: 2015-07-30 | Discharge: 2015-07-30 | Disposition: A | Discharge status: Home / Self Care | Payer: Medicare Other | Attending: Emergency Medicine | Admitting: Emergency Medicine

## 2015-07-30 DIAGNOSIS — J45909 Unspecified asthma, uncomplicated: Secondary | ICD-10-CM | POA: Insufficient documentation

## 2015-07-30 DIAGNOSIS — M79671 Pain in right foot: Secondary | ICD-10-CM | POA: Diagnosis present

## 2015-07-30 DIAGNOSIS — Z6841 Body Mass Index (BMI) 40.0 and over, adult: Secondary | ICD-10-CM | POA: Diagnosis not present

## 2015-07-30 DIAGNOSIS — M109 Gout, unspecified: Secondary | ICD-10-CM | POA: Diagnosis not present

## 2015-07-30 DIAGNOSIS — E669 Obesity, unspecified: Secondary | ICD-10-CM | POA: Insufficient documentation

## 2015-07-30 DIAGNOSIS — Z853 Personal history of malignant neoplasm of breast: Secondary | ICD-10-CM | POA: Diagnosis not present

## 2015-07-30 DIAGNOSIS — Z7982 Long term (current) use of aspirin: Secondary | ICD-10-CM | POA: Insufficient documentation

## 2015-07-30 DIAGNOSIS — I10 Essential (primary) hypertension: Secondary | ICD-10-CM | POA: Diagnosis not present

## 2015-07-30 DIAGNOSIS — M1990 Unspecified osteoarthritis, unspecified site: Secondary | ICD-10-CM | POA: Insufficient documentation

## 2015-07-30 DIAGNOSIS — Z79899 Other long term (current) drug therapy: Secondary | ICD-10-CM | POA: Diagnosis not present

## 2015-07-30 MED ORDER — COLCHICINE 0.6 MG PO TABS: 0.6000 mg | ORAL_TABLET | Freq: Every day | ORAL | Status: DC

## 2015-07-30 MED ORDER — INDOMETHACIN 25 MG PO CAPS: 25.0000 mg | ORAL_CAPSULE | Freq: Three times a day (TID) | ORAL | Status: DC

## 2015-07-30 MED FILL — COLCHICINE 0.6 MG TABLET: 0.6 | 1 days supply | Qty: 3 | Fill #0

## 2015-07-30 MED FILL — INDOMETHACIN 25 MG CAPSULE: 25 | 5 days supply | Qty: 15 | Fill #0

## 2015-07-30 NOTE — ED Notes (Signed)
Pain to right foot, big toe swollen and red

## 2015-07-30 NOTE — ED Provider Notes (Signed)
CSN: [id number]     Arrival date & time 07/30/15  [id number] History   First MD Initiated Contact with Patient 07/30/15 [phone]     Chief Complaint  Patient presents with  . Foot Pain     (Consider location/radiation/quality/duration/timing/severity/associated sxs/prior Treatment) Patient is a 75 y.o. female presenting with lower extremity pain. The history is provided by the patient.  Foot Pain This is a new problem. Episode onset: 3 days. The problem occurs constantly. The problem has been rapidly worsening. Associated symptoms comments: Pain and swelling in the right great toe. The symptoms are aggravated by walking (bending great toe). Nothing relieves the symptoms. She has tried nothing for the symptoms. The treatment provided no relief.    Past Medical History  Diagnosis Date  . Dry eyes   . Bilateral dry eyes   . Asthma     childhood now returning  . Arthritis   . Cancer ([place])     Breast  . Obesity 10/28/2014  . Mixed hyperlipidemia 10/28/2014  . History of chicken pox   . H/O measles   . H/O mumps   . Benign essential tremor 10/28/2014  . Back pain affecting pregnancy 11/02/2014  . Lumbago 11/02/2014  . Essential hypertension, benign 10/28/2014   Past Surgical History  Procedure Laterality Date  . Mastectomy Right   . Back surgery    . Rotator cuff repair Left   . Replacement total knee bilateral Bilateral   . Breast reduction surgery Left   . Cholecystectomy    . Eye surgery  2008    b/l cataracts removed, in [facility]   Family History  Problem Relation Age of Onset  . Heart disease Mother   . Cancer Father     colon  . Asthma Father   . Parkinson's disease Father   . Cancer Maternal Grandmother     uterine  . Heart disease Maternal Grandfather   . Obesity Daughter   . Appendicitis Paternal Grandmother   . Heart attack Mother   . Heart failure Mother   . Hypertension Mother   . Stroke Neg Hx    Social History  Substance Use Topics  . Smoking status: Never  Smoker   . Smokeless tobacco: None  . Alcohol Use: 0.0 oz/week    0 Standard drinks or equivalent per week     Comment: two times a month   OB History    No data available     Review of Systems  All other systems reviewed and are negative.     Allergies  Statins; Amoxicillin; Ampicillin; and Penicillins  Home Medications   Prior to Admission medications   Medication Sig Start Date End Date Taking? Authorizing Provider  albuterol (PROVENTIL HFA;VENTOLIN HFA) 108 (90 Base) MCG/ACT inhaler Inhale 2 puffs into the lungs every 6 (six) hours as needed for wheezing or shortness of breath. 05/07/15   [name], MD  aspirin 81 MG tablet Take 81 mg by mouth daily.    Historical Provider, MD  budesonide-formoterol (SYMBICORT) 80-4.5 MCG/ACT inhaler Inhale 2 puffs into the lungs 2 (two) times daily. 10/30/14   [name], MD  ciclesonide (OMNARIS) 50 MCG/ACT nasal spray Place 2 sprays into both nostrils daily. 10/28/14   [name], MD  colchicine 0.6 MG tablet Take 1 tablet (0.6 mg total) by mouth daily. Take 2 tabs at first and then 1 tab 1 hour later. 07/30/15   Blanchie Dessert, MD  cycloSPORINE (RESTASIS) 0.05 % ophthalmic emulsion  Place 1 drop into both eyes daily. Reported on 06/22/2015    Historical Provider, MD  diltiazem (TIAZAC) 180 MG 24 hr capsule Take 1 capsule (180 mg total) by mouth daily. 04/21/15   [name], MD  HYDROcodone-homatropine [facility]) 5-1.5 MG/5ML syrup Take 5 mLs by mouth every 8 (eight) hours as needed for cough. 06/17/15   Percell Miller Saguier, PA-C  indomethacin (INDOCIN) 25 MG capsule Take 1 capsule (25 mg total) by mouth 3 (three) times daily with meals. 07/30/15   Blanchie Dessert, MD  lansoprazole (PREVACID) 30 MG capsule Take 1 capsule (30 mg total) by mouth 2 (two) times daily. 04/21/15   [name], MD  Multiple Vitamin (MULTIVITAMIN) tablet Take 1 tablet by mouth daily.    Historical Provider, MD  predniSONE (DELTASONE) 10 MG tablet 5 tab po day  1, 4 tab po day 2,3 tab po day 3, 2 tab po day 4, 1 tab po day 5. Patient not taking: Reported on 06/22/2015 06/17/15   Mackie Pai, PA-C  propranolol (INDERAL) 20 MG tablet TAKE 1 TABLET BY MOUTH TWICE A DAY 06/15/15   Eustace Quail Tat, DO  Red Yeast Rice Extract (RED YEAST RICE PO) Take by mouth daily.    Historical Provider, MD  triamterene-hydrochlorothiazide (DYAZIDE) 37.5-25 MG capsule Take 1 each (1 capsule total) by mouth daily. 04/21/15   [name], MD   Pulse 80  Temp(Src) 97.5 F (36.4 C) (Oral)  Resp 16  Ht 5\' 1"  (1.549 m)  Wt 238 lb (107.956 kg)  BMI 44.99 kg/m2  SpO2 99% Physical Exam  Constitutional: She is oriented to person, place, and time. She appears well-developed and well-nourished. No distress.  HENT:  Head: Normocephalic and atraumatic.  Cardiovascular: Normal rate.   Pulmonary/Chest: Effort normal.  Musculoskeletal: She exhibits tenderness.       Feet:  Neurological: She is alert and oriented to person, place, and time.  Skin: Skin is warm and dry.  Psychiatric: She has a normal mood and affect. Her behavior is normal.  Nursing note and vitals reviewed.   ED Course  Procedures (including critical care time) Labs Review Labs Reviewed - No data to display  Imaging Review No results found. I have personally reviewed and evaluated these images and lab results as part of my medical decision-making.   EKG Interpretation None      MDM   Final diagnoses:  Acute gout of right foot, unspecified cause    Patient is a 75 year old female with evidence of gout to the right great toe. She does take multiple blood pressure medications which may be the cause of her gout. She also has recently been ill with URI symptoms which also may have precipitated gout. She has no other significant concerns for cellulitis or trauma. Last creatinine was approximately 1 month ago and it was 1.1. She was started on colchicine and indomethacin. Recommended follow-up with her  doctor in 1 week.    Blanchie Dessert, MD 07/30/15 [phone]

## 2015-07-30 NOTE — Discharge Instructions (Signed)

## 2015-07-31 ENCOUNTER — Ambulatory Visit (HOSPITAL_BASED_OUTPATIENT_CLINIC_OR_DEPARTMENT_OTHER)
Admission: RE | Admit: 2015-07-31 | Discharge: 2015-07-31 | Disposition: A | Discharge status: Home / Self Care | Payer: Medicare Other | Source: Ambulatory Visit | Attending: Family Medicine | Admitting: Family Medicine

## 2015-07-31 DIAGNOSIS — R911 Solitary pulmonary nodule: Secondary | ICD-10-CM

## 2015-07-31 DIAGNOSIS — I700 Atherosclerosis of aorta: Secondary | ICD-10-CM | POA: Diagnosis not present

## 2015-07-31 DIAGNOSIS — Z09 Encounter for follow-up examination after completed treatment for conditions other than malignant neoplasm: Secondary | ICD-10-CM | POA: Diagnosis not present

## 2015-07-31 DIAGNOSIS — Z8709 Personal history of other diseases of the respiratory system: Secondary | ICD-10-CM | POA: Diagnosis not present

## 2015-08-07 ENCOUNTER — Encounter: Payer: Self-pay | Admitting: Family Medicine

## 2015-08-07 ENCOUNTER — Ambulatory Visit (INDEPENDENT_AMBULATORY_CARE_PROVIDER_SITE_OTHER): Payer: Medicare Other | Admitting: Family Medicine

## 2015-08-07 ENCOUNTER — Other Ambulatory Visit: Payer: Self-pay | Admitting: Family Medicine

## 2015-08-07 VITALS — BP 122/84 | HR 69 | Temp 98.0°F | Ht 61.0 in | Wt 240.0 lb

## 2015-08-07 DIAGNOSIS — E669 Obesity, unspecified: Secondary | ICD-10-CM

## 2015-08-07 DIAGNOSIS — D72829 Elevated white blood cell count, unspecified: Secondary | ICD-10-CM | POA: Diagnosis not present

## 2015-08-07 DIAGNOSIS — M10071 Idiopathic gout, right ankle and foot: Secondary | ICD-10-CM

## 2015-08-07 DIAGNOSIS — R739 Hyperglycemia, unspecified: Secondary | ICD-10-CM | POA: Insufficient documentation

## 2015-08-07 DIAGNOSIS — E782 Mixed hyperlipidemia: Secondary | ICD-10-CM

## 2015-08-07 DIAGNOSIS — T7840XD Allergy, unspecified, subsequent encounter: Secondary | ICD-10-CM

## 2015-08-07 DIAGNOSIS — T7840XA Allergy, unspecified, initial encounter: Secondary | ICD-10-CM | POA: Insufficient documentation

## 2015-08-07 DIAGNOSIS — M109 Gout, unspecified: Secondary | ICD-10-CM | POA: Insufficient documentation

## 2015-08-07 DIAGNOSIS — K219 Gastro-esophageal reflux disease without esophagitis: Secondary | ICD-10-CM

## 2015-08-07 DIAGNOSIS — I10 Essential (primary) hypertension: Secondary | ICD-10-CM

## 2015-08-07 LAB — COMPREHENSIVE METABOLIC PANEL
ALK PHOS: 74 U/L (ref 39–117)
ALT: 11 U/L (ref 0–35)
AST: 15 U/L (ref 0–37)
Albumin: 4 g/dL (ref 3.5–5.2)
BUN: 23 mg/dL (ref 6–23)
CALCIUM: 10.1 mg/dL (ref 8.4–10.5)
CO2: 29 mEq/L (ref 19–32)
Chloride: 103 mEq/L (ref 96–112)
Creatinine, Ser: 0.97 mg/dL (ref 0.40–1.20)
GFR: 59.4 mL/min — AB (ref >60.00)
GLUCOSE: 101 mg/dL — AB (ref 70–99)
POTASSIUM: 4 meq/L (ref 3.5–5.1)
Sodium: 142 mEq/L (ref 135–145)
TOTAL PROTEIN: 7.1 g/dL (ref 6.0–8.3)
Total Bilirubin: 0.3 mg/dL (ref 0.2–1.2)

## 2015-08-07 LAB — CBC
HEMATOCRIT: 40.3 % (ref 36.0–46.0)
HEMOGLOBIN: 13.6 g/dL (ref 12.0–15.0)
MCHC: 33.8 g/dL (ref 30.0–36.0)
MCV: 89 fl (ref 78.0–100.0)
Platelets: 367 10*3/uL (ref 150.0–400.0)
RBC: 4.53 Mil/uL (ref 3.87–5.11)
RDW: 14.7 % (ref 11.5–15.5)
WBC: 7.3 10*3/uL (ref 4.0–10.5)

## 2015-08-07 LAB — LIPID PANEL
CHOL/HDL RATIO: 3
CHOLESTEROL: 205 mg/dL — AB (ref 0–200)
HDL: 60.3 mg/dL (ref >39.00)
LDL Cholesterol: 119 mg/dL — ABNORMAL HIGH (ref 0–99)
NonHDL: 144.52
TRIGLYCERIDES: 126 mg/dL (ref 0.0–149.0)
VLDL: 25.2 mg/dL (ref 0.0–40.0)

## 2015-08-07 LAB — HEMOGLOBIN A1C: HEMOGLOBIN A1C: 5.7 % (ref 4.6–6.5)

## 2015-08-07 LAB — URIC ACID: URIC ACID, SERUM: 8.9 mg/dL — AB (ref 2.4–7.0)

## 2015-08-07 MED ORDER — COLCHICINE 0.6 MG PO TABS: ORAL_TABLET | ORAL | Status: DC

## 2015-08-07 MED ORDER — MONTELUKAST SODIUM 10 MG PO TABS: 10.0000 mg | ORAL_TABLET | Freq: Every evening | ORAL | Status: DC | PRN

## 2015-08-07 MED ORDER — ALLOPURINOL 100 MG PO TABS: 100.0000 mg | ORAL_TABLET | Freq: Every day | ORAL | Status: DC

## 2015-08-07 MED ORDER — NAPROXEN 375 MG PO TABS: 375.0000 mg | ORAL_TABLET | Freq: Two times a day (BID) | ORAL | Status: DC | PRN

## 2015-08-07 NOTE — Assessment & Plan Note (Signed)
Repeat CBC today 

## 2015-08-07 NOTE — Assessment & Plan Note (Signed)
minimize simple carbs. Increase exercise as tolerated.  

## 2015-08-07 NOTE — Progress Notes (Signed)
Patient ID: Amanda Copeland, female   DOB: 06-11-1940, 75 y.o.   MRN: [id number]   Subjective:    Patient ID: Amanda Copeland, female    DOB: Oct 01, 1940, 75 y.o.   MRN: [id number]  Chief Complaint  Patient presents with  . Follow-up    ED Gout    HPI Patient is in today for Emergency Room follow-up. She presented to the ER with severe pain in her foot that was found to be a gout flare. She is much better today after some steroids but pain still persists. No redness or warmth. No other acute complaints and no trauma. Denies CP/palp/SOB/HA/congestion/fevers/GI or GU c/o. Taking meds as prescribed  Past Medical History  Diagnosis Date  . Dry eyes   . Bilateral dry eyes   . Asthma     childhood now returning  . Arthritis   . Cancer ([place])     Breast  . Obesity 10/28/2014  . Mixed hyperlipidemia 10/28/2014  . History of chicken pox   . H/O measles   . H/O mumps   . Benign essential tremor 10/28/2014  . Back pain affecting pregnancy 11/02/2014  . Lumbago 11/02/2014  . Essential hypertension, benign 10/28/2014    Past Surgical History  Procedure Laterality Date  . Mastectomy Right   . Back surgery    . Rotator cuff repair Left   . Replacement total knee bilateral Bilateral   . Breast reduction surgery Left   . Cholecystectomy    . Eye surgery  2008    b/l cataracts removed, in [facility]    Family History  Problem Relation Age of Onset  . Heart disease Mother   . Cancer Father     colon  . Asthma Father   . Parkinson's disease Father   . Cancer Maternal Grandmother     uterine  . Heart disease Maternal Grandfather   . Obesity Daughter   . Appendicitis Paternal Grandmother   . Heart attack Mother   . Heart failure Mother   . Hypertension Mother   . Stroke Neg Hx     Social History   Social History  . Marital Status: Married    Spouse Name: N/A  . Number of Children: N/A  . Years of Education: N/A   Occupational History  . 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 Smoking status: Never Smoker   . Smokeless tobacco: Not on file  . Alcohol Use: 0.0 oz/week    0 Standard drinks or equivalent per week     Comment: two times a month  . Drug Use: No  . Sexual Activity: Not on file     Comment: lives with husband, moved [place], no dietary restrictions, retired Education officer, [employer]   Other Topics Concern  . Not on file   Social History Narrative    Outpatient Prescriptions Prior to Visit  Medication Sig Dispense Refill  . indomethacin (INDOCIN) 25 MG capsule Take 1 capsule (25 mg total) by mouth 3 (three) times daily with meals. 15 capsule 0  . albuterol (PROVENTIL HFA;VENTOLIN HFA) 108 (90 Base) MCG/ACT inhaler Inhale 2 puffs into the lungs every 6 (six) hours as needed for wheezing or shortness of breath. 1 Inhaler 0  . aspirin 81 MG tablet Take 81 mg by mouth daily.    . budesonide-formoterol (SYMBICORT) 80-4.5 MCG/ACT inhaler Inhale 2 puffs into the lungs 2 (two) times daily. 1 Inhaler 11  . ciclesonide (OMNARIS) 50 MCG/ACT  nasal spray Place 2 sprays into both nostrils daily. 12.5 g 6  . cycloSPORINE (RESTASIS) 0.05 % ophthalmic emulsion Place 1 drop into both eyes daily. Reported on 06/22/2015    . diltiazem (TIAZAC) 180 MG 24 hr capsule Take 1 capsule (180 mg total) by mouth daily. 90 capsule 2  . lansoprazole (PREVACID) 30 MG capsule Take 1 capsule (30 mg total) by mouth 2 (two) times daily. 60 capsule 6  . Multiple Vitamin (MULTIVITAMIN) tablet Take 1 tablet by mouth daily.    . propranolol (INDERAL) 20 MG tablet TAKE 1 TABLET BY MOUTH TWICE A DAY 60 tablet 1  . Red Yeast Rice Extract (RED YEAST RICE PO) Take by mouth daily.    Marland Kitchen triamterene-hydrochlorothiazide (DYAZIDE) 37.5-25 MG capsule Take 1 each (1 capsule total) by mouth daily. 90 capsule 2  . colchicine 0.6 MG tablet Take 1 tablet (0.6 mg total) by mouth daily. Take 2 tabs at first and then 1 tab 1 hour later. 3 tablet 0  . HYDROcodone-homatropine (HYCODAN) 5-1.5  MG/5ML syrup Take 5 mLs by mouth every 8 (eight) hours as needed for cough. 120 mL 0  . predniSONE (DELTASONE) 10 MG tablet 5 tab po day 1, 4 tab po day 2,3 tab po day 3, 2 tab po day 4, 1 tab po day 5. (Patient not taking: Reported on 06/22/2015) 15 tablet 0   Facility-Administered Medications Prior to Visit  Medication Dose Route Frequency Provider Last Rate Last Dose  . albuterol (PROVENTIL) (2.5 MG/3ML) 0.083% nebulizer solution 2.5 mg  2.5 mg Nebulization Once Jessica C Copland, MD      . ipratropium (ATROVENT) nebulizer solution 0.5 mg  0.5 mg Nebulization Once Darreld Mclean, MD        Allergies  Allergen Reactions  . Statins Other (See Comments)    Per reports muscle aches and joint pain  . Amoxicillin Rash  . Ampicillin Rash  . Penicillins Rash    Review of Systems  Constitutional: Positive for malaise/fatigue. Negative for fever.  HENT: Positive for congestion.   Eyes: Negative for blurred vision.  Respiratory: Negative for shortness of breath.   Cardiovascular: Negative for chest pain, palpitations and leg swelling.  Gastrointestinal: Negative for nausea, abdominal pain and blood in stool.  Genitourinary: Negative for dysuria and frequency.  Musculoskeletal: Positive for joint pain. Negative for falls.  Skin: Negative for rash.  Neurological: Negative for dizziness, loss of consciousness and headaches.  Endo/Heme/Allergies: Negative for environmental allergies.  Psychiatric/Behavioral: Negative for depression. The patient is not nervous/anxious.        Objective:    Physical Exam  Constitutional: She is oriented to person, place, and time. She appears well-developed and well-nourished. No distress.  HENT:  Head: Normocephalic and atraumatic.  Nose: Nose normal.  Eyes: Right eye exhibits no discharge. Left eye exhibits no discharge.  Neck: Normal range of motion. Neck supple.  Cardiovascular: Normal rate and regular rhythm.   No murmur heard. Pulmonary/Chest:  Effort normal and breath sounds normal.  Abdominal: Soft. Bowel sounds are normal. There is no tenderness.  Musculoskeletal: She exhibits no edema.  Neurological: She is alert and oriented to person, place, and time.  Skin: Skin is warm and dry.  Psychiatric: She has a normal mood and affect.  Nursing note and vitals reviewed.   BP 122/84 mmHg  Pulse 69  Temp(Src) 98 F (36.7 C) (Oral)  Ht 5\' 1"  (1.549 m)  Wt 240 lb (108.863 kg)  BMI 45.37 kg/m2  SpO2  91% Wt Readings from Last 3 Encounters:  08/07/15 240 lb (108.863 kg)  07/30/15 238 lb (107.956 kg)  06/22/15 240 lb (108.863 kg)     Lab Results  Component Value Date   WBC 13.8* 04/21/2015   HGB 13.3 04/21/2015   HCT 38.9 04/21/2015   PLT 351.0 04/21/2015   GLUCOSE 119* 04/21/2015   CHOL 199 04/21/2015   TRIG 99.0 04/21/2015   HDL 71.50 04/21/2015   LDLCALC 108* 04/21/2015   ALT 10 04/21/2015   AST 15 04/21/2015   NA 138 04/21/2015   K 3.8 04/21/2015   CL 100 04/21/2015   CREATININE 1.10* 06/22/2015   BUN 15 04/21/2015   CO2 28 04/21/2015   TSH 2.43 04/21/2015   HGBA1C 5.9 04/21/2015    Lab Results  Component Value Date   TSH 2.43 04/21/2015   Lab Results  Component Value Date   WBC 13.8* 04/21/2015   HGB 13.3 04/21/2015   HCT 38.9 04/21/2015   MCV 89.4 04/21/2015   PLT 351.0 04/21/2015   Lab Results  Component Value Date   NA 138 04/21/2015   K 3.8 04/21/2015   CO2 28 04/21/2015   GLUCOSE 119* 04/21/2015   BUN 15 04/21/2015   CREATININE 1.10* 06/22/2015   BILITOT 0.5 04/21/2015   ALKPHOS 66 04/21/2015   AST 15 04/21/2015   ALT 10 04/21/2015   PROT 6.9 04/21/2015   ALBUMIN 3.8 04/21/2015   CALCIUM 9.4 04/21/2015   GFR 64.81 04/21/2015   Lab Results  Component Value Date   CHOL 199 04/21/2015   Lab Results  Component Value Date   HDL 71.50 04/21/2015   Lab Results  Component Value Date   LDLCALC 108* 04/21/2015   Lab Results  Component Value Date   TRIG 99.0 04/21/2015   Lab  Results  Component Value Date   CHOLHDL 3 04/21/2015   Lab Results  Component Value Date   HGBA1C 5.9 04/21/2015       Assessment & Plan:   Problem List Items Addressed This Visit    Leukocytosis    Repeat CBC today      Relevant Orders   CBC   Uric acid   Comprehensive metabolic panel   Hemoglobin A1c   Lipid panel   Hyperglycemia - Primary     minimize simple carbs. Increase exercise as tolerated.      Relevant Orders   CBC   Uric acid   Comprehensive metabolic panel   Hemoglobin A1c   Lipid panel   Essential hypertension, benign    Well controlled, no changes to meds. Encouraged heart healthy diet such as the DASH diet and exercise as tolerated.       Esophageal reflux    Avoid offending foods, start probiotics. Do not eat large meals in late evening and consider raising head of bed.       Allergic    Add Singulair 10 mg and can use with Zyrtec prn      Relevant Orders   CBC   Uric acid   Comprehensive metabolic panel   Hemoglobin A1c   Lipid panel   Acute gout    Here today for ER follow up doing better but symptoms not fully resolved. Partial response to Colchicine just 3 tabs, will give 6 tabs if she has another flare. D/c Indomethacin and use Naproxen 375 mg bit prn with food      Relevant Orders   CBC   Uric acid   Comprehensive metabolic  panel   Hemoglobin A1c   Lipid panel    Other Visit Diagnoses    Hyperlipidemia, mixed        Relevant Orders    Lipid panel       I have discontinued Ms. Danese HYDROcodone-homatropine, predniSONE, and indomethacin. I have also changed her colchicine. Additionally, I am having her start on montelukast and naproxen. Lastly, I am having her maintain her cycloSPORINE, multivitamin, Red Yeast Rice Extract (RED YEAST RICE PO), aspirin, ciclesonide, budesonide-formoterol, diltiazem, triamterene-hydrochlorothiazide, lansoprazole, albuterol, and propranolol. We will continue to administer albuterol and  ipratropium.  Meds ordered this encounter  Medications  . colchicine 0.6 MG tablet    Sig: Take 2 tabs at first and then 1 tab q 2 hours til pain relief/diarrhea or max of 6 tabs in 24 hours    Dispense:  6 tablet    Refill:  1  . montelukast (SINGULAIR) 10 MG tablet    Sig: Take 1 tablet (10 mg total) by mouth at bedtime as needed.    Dispense:  30 tablet    Refill:  3  . naproxen (NAPROSYN) 375 MG tablet    Sig: Take 1 tablet (375 mg total) by mouth 2 (two) times daily as needed for mild pain, moderate pain or headache (with food).    Dispense:  30 tablet    Refill:  1     [name], MD

## 2015-08-07 NOTE — Assessment & Plan Note (Signed)
Well controlled, no changes to meds. Encouraged heart healthy diet such as the DASH diet and exercise as tolerated.  °

## 2015-08-07 NOTE — Patient Instructions (Signed)

## 2015-08-07 NOTE — Progress Notes (Signed)
Pre visit review using our clinic review tool, if applicable. No additional management support is needed unless otherwise documented below in the visit note. 

## 2015-08-07 NOTE — Assessment & Plan Note (Signed)
Here today for ER follow up doing better but symptoms not fully resolved. Partial response to Colchicine just 3 tabs, will give 6 tabs if she has another flare. D/c Indomethacin and use Naproxen 375 mg bit prn with food

## 2015-08-07 NOTE — Assessment & Plan Note (Signed)
Add Singulair 10 mg and can use with Zyrtec prn

## 2015-08-07 NOTE — Assessment & Plan Note (Signed)
Avoid offending foods, start probiotics. Do not eat large meals in late evening and consider raising head of bed.  

## 2015-08-12 ENCOUNTER — Other Ambulatory Visit: Payer: Self-pay | Admitting: Neurology

## 2015-08-12 MED ORDER — PROPRANOLOL HCL 20 MG PO TABS: 20.0000 mg | ORAL_TABLET | Freq: Two times a day (BID) | ORAL | Status: DC

## 2015-08-12 NOTE — Assessment & Plan Note (Signed)
Encouraged DASH diet, decrease po intake and increase exercise as tolerated. Needs 7-8 hours of sleep nightly. Avoid trans fats, eat small, frequent meals every 4-5 hours with lean proteins, complex carbs and healthy fats. Minimize simple carbs 

## 2015-08-12 NOTE — Telephone Encounter (Signed)
Patient asked for 90 day supply of Propranolol. Medication resent.

## 2015-08-13 ENCOUNTER — Ambulatory Visit: Payer: Self-pay | Admitting: Neurology

## 2015-08-20 DIAGNOSIS — L821 Other seborrheic keratosis: Secondary | ICD-10-CM | POA: Diagnosis not present

## 2015-09-03 ENCOUNTER — Other Ambulatory Visit: Payer: Self-pay | Admitting: Neurology

## 2015-09-03 NOTE — Telephone Encounter (Signed)
Propranolol refill requested. Per last office note- patient to remain on medication. Refill approved and sent to patient's pharmacy.   

## 2015-09-18 ENCOUNTER — Other Ambulatory Visit (INDEPENDENT_AMBULATORY_CARE_PROVIDER_SITE_OTHER): Payer: Medicare Other

## 2015-09-18 ENCOUNTER — Ambulatory Visit (INDEPENDENT_AMBULATORY_CARE_PROVIDER_SITE_OTHER): Payer: Medicare Other | Admitting: Medical

## 2015-09-18 ENCOUNTER — Encounter: Payer: Self-pay | Admitting: Medical

## 2015-09-18 VITALS — BP 120/80 | HR 80 | Temp 97.6°F | Ht 61.0 in | Wt 241.4 lb

## 2015-09-18 DIAGNOSIS — L089 Local infection of the skin and subcutaneous tissue, unspecified: Secondary | ICD-10-CM

## 2015-09-18 DIAGNOSIS — M109 Gout, unspecified: Secondary | ICD-10-CM | POA: Diagnosis not present

## 2015-09-18 DIAGNOSIS — T7840XA Allergy, unspecified, initial encounter: Secondary | ICD-10-CM | POA: Diagnosis not present

## 2015-09-18 LAB — COMPREHENSIVE METABOLIC PANEL
ALBUMIN: 4 g/dL (ref 3.5–5.2)
ALK PHOS: 81 U/L (ref 39–117)
ALT: 12 U/L (ref 0–35)
AST: 17 U/L (ref 0–37)
BUN: 22 mg/dL (ref 6–23)
CALCIUM: 9.8 mg/dL (ref 8.4–10.5)
CO2: 30 mEq/L (ref 19–32)
CREATININE: 0.89 mg/dL (ref 0.40–1.20)
Chloride: 100 mEq/L (ref 96–112)
GFR: 65.58 mL/min (ref >60.00)
Glucose, Bld: 97 mg/dL (ref 70–99)
POTASSIUM: 3.9 meq/L (ref 3.5–5.1)
SODIUM: 139 meq/L (ref 135–145)
TOTAL PROTEIN: 7.1 g/dL (ref 6.0–8.3)
Total Bilirubin: 0.4 mg/dL (ref 0.2–1.2)

## 2015-09-18 LAB — URIC ACID: Uric Acid, Serum: 7 mg/dL (ref 2.4–7.0)

## 2015-09-18 MED ORDER — DOXYCYCLINE HYCLATE 100 MG PO TABS: 100.0000 mg | ORAL_TABLET | Freq: Two times a day (BID) | ORAL | 0 refills | Status: DC

## 2015-09-18 MED ORDER — HYDROXYZINE HCL 25 MG PO TABS: ORAL_TABLET | ORAL | 0 refills | Status: DC

## 2015-09-18 MED ORDER — METHYLPREDNISOLONE ACETATE 40 MG/ML IJ SUSP
40.0000 mg | Freq: Once | INTRAMUSCULAR | Status: AC
  Administered 2015-09-18: 40 mg via INTRAMUSCULAR

## 2015-09-18 MED ORDER — PREDNISONE 10 MG PO TABS: ORAL_TABLET | ORAL | 0 refills | Status: DC

## 2015-09-18 NOTE — Progress Notes (Signed)
Subjective:    Patient ID: Amanda Copeland, female    DOB: 1940-08-11, 75 y.o.   MRN: [id number]  HPI  Pt in for area under her chin. She was swimming at pool the other day on wed afternoon. She states felt sharp sting. She did not see anything. The area itches and it appears to be getting more red and larger each day. No fever, no chills. No sob or wheezing.    Pt not diabetic. He last a1c was 5.7.       Review of Systems  Constitutional: Negative for chills, fatigue and fever.  Respiratory: Negative for chest tightness, shortness of breath and wheezing.   Cardiovascular: Negative for chest pain and palpitations.  Skin:       Underchin/and neck red rash and warmth.  Neurological: Negative for dizziness and headaches.  Hematological: Negative for adenopathy. Does not bruise/bleed easily.    Past Medical History:  Diagnosis Date  . Arthritis   . Asthma    childhood now returning  . Back pain affecting pregnancy 11/02/2014  . Benign essential tremor 10/28/2014  . Bilateral dry eyes   . Cancer ([facility])    Breast  . Dry eyes   . Essential hypertension, benign 10/28/2014  . H/O measles   . H/O mumps   . History of chicken pox   . Lumbago 11/02/2014  . Mixed hyperlipidemia 10/28/2014  . Obesity 10/28/2014     Social History   Social History  . Marital status: Married    Spouse name: N/A  . Number of children: N/A  . Years of education: N/A   Occupational History  . 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 Smoking status: Never Smoker  . Smokeless tobacco: Not on file  . Alcohol use 0.0 oz/week     Comment: two times a month  . Drug use: No  . Sexual activity: Not on file     Comment: lives with husband, moved [place], no dietary restrictions, retired Education officer, [employer]   Other Topics Concern  . Not on file   Social History Narrative  . No narrative on file    Past Surgical History:  Procedure Laterality Date  . BACK SURGERY    . BREAST  REDUCTION SURGERY Left   . CHOLECYSTECTOMY    . EYE SURGERY  2008   b/l cataracts removed, in [place]  . MASTECTOMY Right   . REPLACEMENT TOTAL KNEE BILATERAL Bilateral   . ROTATOR CUFF REPAIR Left     Family History  Problem Relation Age of Onset  . Heart disease Mother   . Cancer Father     colon  . Asthma Father   . Parkinson's disease Father   . Cancer Maternal Grandmother     uterine  . Heart disease Maternal Grandfather   . Obesity Daughter   . Appendicitis Paternal Grandmother   . Heart attack Mother   . Heart failure Mother   . Hypertension Mother   . Stroke Neg Hx     Allergies  Allergen Reactions  . Amoxicillin Rash  . Ampicillin Rash  . Penicillins Rash  . Statins Other (See Comments)    Per reports muscle aches and joint pain    Current Outpatient Prescriptions on File Prior to Visit  Medication Sig Dispense Refill  . albuterol (PROVENTIL HFA;VENTOLIN HFA) 108 (90 Base) MCG/ACT inhaler Inhale 2 puffs into the lungs every 6 (six) hours as needed for wheezing  or shortness of breath. 1 Inhaler 0  . allopurinol (ZYLOPRIM) 100 MG tablet Take 1 tablet (100 mg total) by mouth daily. 30 tablet 3  . aspirin 81 MG tablet Take 81 mg by mouth daily.    . budesonide-formoterol (SYMBICORT) 80-4.5 MCG/ACT inhaler Inhale 2 puffs into the lungs 2 (two) times daily. 1 Inhaler 11  . ciclesonide (OMNARIS) 50 MCG/ACT nasal spray Place 2 sprays into both nostrils daily. 12.5 g 6  . colchicine 0.6 MG tablet Take 2 tabs at first and then 1 tab q 2 hours til pain relief/diarrhea or max of 6 tabs in 24 hours 30 tablet 3  . cycloSPORINE (RESTASIS) 0.05 % ophthalmic emulsion Place 1 drop into both eyes daily. Reported on 06/22/2015    . diltiazem (TIAZAC) 180 MG 24 hr capsule Take 1 capsule (180 mg total) by mouth daily. 90 capsule 2  . lansoprazole (PREVACID) 30 MG capsule Take 1 capsule (30 mg total) by mouth 2 (two) times daily. 60 capsule 6  . montelukast (SINGULAIR) 10 MG tablet  Take 1 tablet (10 mg total) by mouth at bedtime as needed. 30 tablet 3  . Multiple Vitamin (MULTIVITAMIN) tablet Take 1 tablet by mouth daily.    . naproxen (NAPROSYN) 375 MG tablet Take 1 tablet (375 mg total) by mouth 2 (two) times daily as needed for mild pain, moderate pain or headache (with food). 30 tablet 1  . propranolol (INDERAL) 20 MG tablet TAKE 1 TABLET (20 MG TOTAL) BY MOUTH 2 (TWO) TIMES DAILY. 180 tablet 0  . Red Yeast Rice Extract (RED YEAST RICE PO) Take by mouth daily.    Marland Kitchen triamterene-hydrochlorothiazide (DYAZIDE) 37.5-25 MG capsule Take 1 each (1 capsule total) by mouth daily. 90 capsule 2   Current Facility-Administered Medications on File Prior to Visit  Medication Dose Route Frequency Provider Last Rate Last Dose  . albuterol (PROVENTIL) (2.5 MG/3ML) 0.083% nebulizer solution 2.5 mg  2.5 mg Nebulization Once Jessica C Copland, MD      . ipratropium (ATROVENT) nebulizer solution 0.5 mg  0.5 mg Nebulization Once Jessica C Copland, MD        BP 120/80 (BP Location: Left Arm, Patient Position: Sitting, Cuff Size: Normal)   Pulse 80   Temp 97.6 F (36.4 C) (Oral)   Ht 5\' 1"  (1.549 m)   Wt 241 lb 6.4 oz (109.5 kg)   SpO2 95%   BMI 45.61 kg/m       Objective:   Physical Exam  General Mental Status- Alert. General Appearance- Not in acute distress.   Skin  Under chin and down to mid portion of her neck. Had red and warm area. Faint warm and faint indurated feel but not tender to touch. No fluctuance.  Mouth- no swelling. Floor of mouth appears normal.  Neck Carotid Arteries- Normal color. Moisture- Normal Moisture. No carotid bruits. No JVD. See skin.  Chest and Lung Exam Auscultation: Breath Sounds:-Normal.  Cardiovascular Auscultation:Rythm- Regular. Murmurs & Other Heart Sounds:Auscultation of the heart reveals- No Murmurs.   Neurologic Cranial Nerve exam:- CN III-XII intact(No nystagmus), symmetric smile. Strength:- 5/5 equal and symmetric  strength both upper and lower extremities.      Assessment & Plan:  You appear to have had allergic reaction from possilble yellow jacket or wasp sting.(by given history). Followed by secondary skin infection.  For allergic reaction we gave depomedrol 40 mg im and tapered prednisone dose. For itching hydroxyzine but use at night only.   You are about  to go on vacation unfortunately if your condition worses or changes as advised then would recommend ED evaluation.  Follow up in 7-10 days or as needed  Amanda Copeland, Percell Miller, [employer]

## 2015-09-18 NOTE — Addendum Note (Signed)
Addended by: Tasia Catchings on: 09/18/2015 11:19 AM   Modules accepted: Orders

## 2015-09-18 NOTE — Progress Notes (Signed)
Pre visit review using our clinic review tool, if applicable. No additional management support is needed unless otherwise documented below in the visit note./[facility]  

## 2015-09-18 NOTE — Patient Instructions (Addendum)
You appear to have had allergic reaction from possilble yellow jacket or wasp sting.(by given history). Followed by secondary skin infection.  For allergic reaction we gave depomedrol 40 mg im and tapered prednisone dose. For itching hydroxyzine but use at night only.   For skin infection rx of doxycycline.(rx advisement given)  You are about to go on vacation unfortunately if your condition worses or changes as advised then would recommend ED evaluation.  Follow up in 7-10 days or as needed

## 2015-09-29 DIAGNOSIS — H01002 Unspecified blepharitis right lower eyelid: Secondary | ICD-10-CM | POA: Diagnosis not present

## 2015-09-29 DIAGNOSIS — H01004 Unspecified blepharitis left upper eyelid: Secondary | ICD-10-CM | POA: Diagnosis not present

## 2015-09-29 DIAGNOSIS — H04123 Dry eye syndrome of bilateral lacrimal glands: Secondary | ICD-10-CM | POA: Diagnosis not present

## 2015-09-29 DIAGNOSIS — H01005 Unspecified blepharitis left lower eyelid: Secondary | ICD-10-CM | POA: Diagnosis not present

## 2015-09-29 DIAGNOSIS — H01001 Unspecified blepharitis right upper eyelid: Secondary | ICD-10-CM | POA: Diagnosis not present

## 2015-10-01 ENCOUNTER — Other Ambulatory Visit: Payer: Self-pay | Admitting: Family Medicine

## 2015-10-01 MED ORDER — TRIAMTERENE-HCTZ 37.5-25 MG PO CAPS: 1.0000 | ORAL_CAPSULE | Freq: Every day | ORAL | 2 refills | Status: DC

## 2015-10-01 MED ORDER — DILTIAZEM HCL ER BEADS 180 MG PO CP24: 180.0000 mg | ORAL_CAPSULE | Freq: Every day | ORAL | 2 refills | Status: DC

## 2015-10-07 NOTE — Progress Notes (Signed)
Subjective:   Amanda Copeland was seen in consultation in the movement disorder clinic at the request of [name], MD.  The evaluation is for tremor.  This patient is accompanied in the office by her spouse who supplements the history.   The patient is a 75 y.o. right handed female with a history of tremor.  The patient reports that she has had tremor in both hands for the last several years.  She went to a neurologist in [place] and was diagnosed with ET and was put on propranolol, 20 mg bid and it helped a lot without side effects.  There is a family hx of tremor in her father but he has PD.  There is no other family hx of tremor    Affected by caffeine:  No. (coffee is only decaf) Affected by alcohol:  unknown Affected by stress:  No. Affected by fatigue:  No. Spills soup if on spoon:  No. (not now that she is on the propranolol) Spills glass of liquid if full:  No. Affects ADL's (tying shoes, brushing teeth, etc):  No.  Current/Previously tried tremor medications: propranolol  Current medications that may exacerbate tremor:  Albuterol (doesn't notice that it affects tremor); symbicort  Outside reports reviewed: historical medical records.  10/09/15 update:  Pt returns for f/u re: ET.  The records that were made available to me were reviewed.   Reports that she is doing well on propranolol 20 mg bid.  Rarely she will forget the night pill and "i can tell in the AM."  No SE with medication.  She is under tons of stress.  Her son in law was dx with a bone cancer and he is getting aggressive chemo and needs an amputation.  Her mother in law is dying in [place].  She is helping to take care of the grandchildren.    Allergies  Allergen Reactions  . Amoxicillin Rash  . Ampicillin Rash  . Penicillins Rash  . Statins Other (See Comments)    Per reports muscle aches and joint pain    Outpatient Encounter Prescriptions as of 10/09/2015  Medication Sig  . albuterol (PROVENTIL HFA;VENTOLIN  HFA) 108 (90 Base) MCG/ACT inhaler Inhale 2 puffs into the lungs every 6 (six) hours as needed for wheezing or shortness of breath.  . allopurinol (ZYLOPRIM) 100 MG tablet Take 1 tablet (100 mg total) by mouth daily.  Marland Kitchen aspirin 81 MG tablet Take 81 mg by mouth daily.  . budesonide-formoterol (SYMBICORT) 80-4.5 MCG/ACT inhaler Inhale 2 puffs into the lungs 2 (two) times daily.  . ciclesonide (OMNARIS) 50 MCG/ACT nasal spray Place 2 sprays into both nostrils daily.  Marland Kitchen diltiazem (TIAZAC) 180 MG 24 hr capsule Take 1 capsule (180 mg total) by mouth daily.  . hydrOXYzine (ATARAX/VISTARIL) 25 MG tablet 1tab po q hs as needed itching  . lansoprazole (PREVACID) 30 MG capsule Take 1 capsule (30 mg total) by mouth 2 (two) times daily.  . montelukast (SINGULAIR) 10 MG tablet Take 1 tablet (10 mg total) by mouth at bedtime as needed.  . Multiple Vitamin (MULTIVITAMIN) tablet Take 1 tablet by mouth daily.  . propranolol (INDERAL) 20 MG tablet Take 1 tablet (20 mg total) by mouth 2 (two) times daily.  . Red Yeast Rice Extract (RED YEAST RICE PO) Take by mouth daily.  Marland Kitchen triamterene-hydrochlorothiazide (DYAZIDE) 37.5-25 MG capsule Take 1 each (1 capsule total) by mouth daily.  . [DISCONTINUED] propranolol (INDERAL) 20 MG tablet TAKE 1 TABLET (20 MG TOTAL)  BY MOUTH 2 (TWO) TIMES DAILY.  . [DISCONTINUED] colchicine 0.6 MG tablet Take 2 tabs at first and then 1 tab q 2 hours til pain relief/diarrhea or max of 6 tabs in 24 hours  . [DISCONTINUED] cycloSPORINE (RESTASIS) 0.05 % ophthalmic emulsion Place 1 drop into both eyes daily. Reported on 06/22/2015  . [DISCONTINUED] doxycycline (VIBRA-TABS) 100 MG tablet Take 1 tablet (100 mg total) by mouth 2 (two) times daily. Can give generic or capsules  . [DISCONTINUED] naproxen (NAPROSYN) 375 MG tablet Take 1 tablet (375 mg total) by mouth 2 (two) times daily as needed for mild pain, moderate pain or headache (with food).  . [DISCONTINUED] predniSONE (DELTASONE) 10 MG tablet  5 tab po day 1, 4 tab po day 2, 3 tab po day 3, 2 tab po day 4, 1 tab po day 5   Facility-Administered Encounter Medications as of 10/09/2015  Medication  . albuterol (PROVENTIL) (2.5 MG/3ML) 0.083% nebulizer solution 2.5 mg  . ipratropium (ATROVENT) nebulizer solution 0.5 mg    Past Medical History:  Diagnosis Date  . Arthritis   . Asthma    childhood now returning  . Back pain affecting pregnancy 11/02/2014  . Benign essential tremor 10/28/2014  . Bilateral dry eyes   . Cancer (HCC)    Breast  . Dry eyes   . Essential hypertension, benign 10/28/2014  . H/O measles   . H/O mumps   . History of chicken pox   . Lumbago 11/02/2014  . Mixed hyperlipidemia 10/28/2014  . Obesity 10/28/2014    Past Surgical History:  Procedure Laterality Date  . BACK SURGERY    . BREAST REDUCTION SURGERY Left   . CHOLECYSTECTOMY    . EYE SURGERY  2008   b/l cataracts removed, in [place]  . MASTECTOMY Right   . REPLACEMENT TOTAL KNEE BILATERAL Bilateral   . ROTATOR CUFF REPAIR Left     Social History   Social History  . Marital status: Married    Spouse name: N/A  . Number of children: N/A  . Years of education: N/A   Occupational History  . 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 Smoking status: Never Smoker  . Smokeless tobacco: Not on file  . Alcohol use 0.0 oz/week     Comment: two times a month  . Drug use: No  . Sexual activity: Not on file     Comment: lives with husband, moved [place], no dietary restrictions, retired Education officer, museum   Other Topics Concern  . Not on file   Social History Narrative  . No narrative on file    Family Status  Relation Status  . Mother Deceased   heart disease  . Father Deceased   colon cancer, Parkinson's Disease  . Maternal Grandmother Deceased  . Maternal Grandfather Deceased  . Daughter Alive  . Paternal Grandmother Deceased  . Paternal Grandfather Deceased    Review of Systems SOB with asthma.  A  complete 10 system ROS was obtained and was negative apart from what is mentioned.   Objective:   VITALS:   Vitals:   10/09/15 1104  BP: 122/70  Pulse: 70  Weight: 237 lb (107.5 kg)  Height: 5' 1.5" (1.562 m)   Gen:  Appears stated age and in NAD. HEENT:  Normocephalic, atraumatic. The mucous membranes are moist. The superficial temporal arteries are without ropiness or tenderness. Cardiovascular: Regular rate and rhythm. Lungs: Clear to auscultation bilaterally.  Neck: There are no carotid bruits noted bilaterally.  NEUROLOGICAL:  Orientation:  The patient is alert and oriented x 3.   Cranial nerves: There is good facial symmetry. The pupils are equal round and reactive to light bilaterally. Fundoscopic exam reveals clear disc margins bilaterally. Extraocular muscles are intact and visual fields are full to confrontational testing. Speech is fluent and clear. Soft palate rises symmetrically and there is no tongue deviation. Hearing is intact to conversational tone. Tone: Tone is good throughout. Sensation: Sensation is intact to light touch throughout Coordination:  The patient has no dysdiadichokinesia or dysmetria. Motor: Strength is 5/5 in the bilateral upper and lower extremities.  Shoulder shrug is equal bilaterally.  There is no pronator drift.  There are no fasciculations noted. Gait and Station: The patient is able to ambulate without difficulty. The patient is mildly wide-based.  She has difficulty ambulating in a tandem fashion.  She is able to stand in the Romberg position.  MOVEMENT EXAM: Tremor:  There is no tremor today     Assessment/Plan:   1.  Essential Tremor.  -This is evidenced by the symmetrical nature and longstanding hx of gradually getting worse.  She is well controlled on propranolol, 20 mg twice a day.  She has no side effects with the medication.   We talked about nature and pathophysiology.  She is happy with her tremor control.  I reassured her that she  had no evidence of Parkinson's disease.  She was worried about this because her father had Parkinson's disease. 2.  Situational depression  -discussed stress relief mechanisms.  Going on cruise soon.   3.  F/u 6-8 months.

## 2015-10-09 ENCOUNTER — Encounter: Payer: Self-pay | Admitting: Neurology

## 2015-10-09 ENCOUNTER — Ambulatory Visit (INDEPENDENT_AMBULATORY_CARE_PROVIDER_SITE_OTHER): Payer: Medicare Other | Admitting: Neurology

## 2015-10-09 VITALS — BP 122/70 | HR 70 | Ht 61.5 in | Wt 237.0 lb

## 2015-10-09 DIAGNOSIS — F4321 Adjustment disorder with depressed mood: Secondary | ICD-10-CM

## 2015-10-09 DIAGNOSIS — G250 Essential tremor: Secondary | ICD-10-CM | POA: Diagnosis not present

## 2015-10-09 MED ORDER — PROPRANOLOL HCL 20 MG PO TABS: 20.0000 mg | ORAL_TABLET | Freq: Two times a day (BID) | ORAL | 1 refills | Status: DC

## 2015-11-09 ENCOUNTER — Encounter: Payer: Self-pay | Admitting: Family Medicine

## 2015-11-09 ENCOUNTER — Ambulatory Visit (INDEPENDENT_AMBULATORY_CARE_PROVIDER_SITE_OTHER): Payer: Medicare Other | Admitting: Family Medicine

## 2015-11-09 VITALS — BP 120/76 | HR 66 | Temp 98.0°F | Ht 62.0 in | Wt 242.2 lb

## 2015-11-09 DIAGNOSIS — I10 Essential (primary) hypertension: Secondary | ICD-10-CM | POA: Diagnosis not present

## 2015-11-09 DIAGNOSIS — Z23 Encounter for immunization: Secondary | ICD-10-CM | POA: Diagnosis not present

## 2015-11-09 DIAGNOSIS — M109 Gout, unspecified: Secondary | ICD-10-CM | POA: Diagnosis not present

## 2015-11-09 DIAGNOSIS — E782 Mixed hyperlipidemia: Secondary | ICD-10-CM | POA: Diagnosis not present

## 2015-11-09 DIAGNOSIS — R739 Hyperglycemia, unspecified: Secondary | ICD-10-CM | POA: Diagnosis not present

## 2015-11-09 DIAGNOSIS — M5440 Lumbago with sciatica, unspecified side: Secondary | ICD-10-CM | POA: Diagnosis not present

## 2015-11-09 LAB — LIPID PANEL
CHOLESTEROL: 222 mg/dL — AB (ref 0–200)
HDL: 59.9 mg/dL (ref >39.00)
LDL CALC: 127 mg/dL — AB (ref 0–99)
NonHDL: 161.74
Total CHOL/HDL Ratio: 4
Triglycerides: 175 mg/dL — ABNORMAL HIGH (ref 0.0–149.0)
VLDL: 35 mg/dL (ref 0.0–40.0)

## 2015-11-09 LAB — COMPREHENSIVE METABOLIC PANEL
ALBUMIN: 3.7 g/dL (ref 3.5–5.2)
ALK PHOS: 76 U/L (ref 39–117)
ALT: 11 U/L (ref 0–35)
AST: 17 U/L (ref 0–37)
BUN: 20 mg/dL (ref 6–23)
CHLORIDE: 106 meq/L (ref 96–112)
CO2: 27 mEq/L (ref 19–32)
Calcium: 9.4 mg/dL (ref 8.4–10.5)
Creatinine, Ser: 0.9 mg/dL (ref 0.40–1.20)
GFR: 64.72 mL/min (ref >60.00)
Glucose, Bld: 88 mg/dL (ref 70–99)
POTASSIUM: 4 meq/L (ref 3.5–5.1)
SODIUM: 143 meq/L (ref 135–145)
Total Bilirubin: 0.3 mg/dL (ref 0.2–1.2)
Total Protein: 7.2 g/dL (ref 6.0–8.3)

## 2015-11-09 LAB — CBC
HCT: 42.9 % (ref 36.0–46.0)
HEMOGLOBIN: 14.6 g/dL (ref 12.0–15.0)
MCHC: 34.2 g/dL (ref 30.0–36.0)
MCV: 89.9 fl (ref 78.0–100.0)
PLATELETS: 296 10*3/uL (ref 150.0–400.0)
RBC: 4.77 Mil/uL (ref 3.87–5.11)
RDW: 14.7 % (ref 11.5–15.5)
WBC: 8.2 10*3/uL (ref 4.0–10.5)

## 2015-11-09 LAB — HEMOGLOBIN A1C: Hgb A1c MFr Bld: 5.7 % (ref 4.6–6.5)

## 2015-11-09 LAB — TSH: TSH: 4.86 u[IU]/mL — ABNORMAL HIGH (ref 0.35–4.50)

## 2015-11-09 NOTE — Assessment & Plan Note (Signed)
No recent flares check levels

## 2015-11-09 NOTE — Progress Notes (Signed)
Patient ID: Amanda Copeland, female   DOB: 04-Jul-1940, 75 y.o.   MRN: [id number]   Subjective:    Patient ID: Amanda Copeland, female    DOB: 1940-08-08, 75 y.o.   MRN: [id number]  Chief Complaint  Patient presents with  . Follow-up    HPI Patient is in today for follow up. They are under a great deal of stress. Their son in law is undergoing treatment for osteosarcoma. And mother in law has recently passed. Otherwise notes some mild back pain now. Started last week with sharp pain but nearly resolved now. No incontinence or injury. Heat was helpful. Denies CP/palp/SOB/HA/congestion/fevers/GI or GU c/o. Taking meds as prescribed  Past Medical History:  Diagnosis Date  . Arthritis   . Asthma    childhood now returning  . Back pain affecting pregnancy 11/02/2014  . Benign essential tremor 10/28/2014  . Bilateral dry eyes   . Cancer (HCC)    Breast  . Dry eyes   . Essential hypertension, benign 10/28/2014  . H/O measles   . H/O mumps   . History of chicken pox   . Lumbago 11/02/2014  . Mixed hyperlipidemia 10/28/2014  . Obesity 10/28/2014    Past Surgical History:  Procedure Laterality Date  . BACK SURGERY    . BREAST REDUCTION SURGERY Left   . CHOLECYSTECTOMY    . EYE SURGERY  2008   b/l cataracts removed, in [place]  . MASTECTOMY Right   . REPLACEMENT TOTAL KNEE BILATERAL Bilateral   . ROTATOR CUFF REPAIR Left     Family History  Problem Relation Age of Onset  . Heart disease Mother   . Cancer Father     colon  . Asthma Father   . Parkinson's disease Father   . Cancer Maternal Grandmother     uterine  . Heart disease Maternal Grandfather   . Obesity Daughter   . Appendicitis Paternal Grandmother   . Heart attack Mother   . Heart failure Mother   . Hypertension Mother   . Stroke Neg Hx     Social History   Social History  . Marital status: Married    Spouse name: N/A  . Number of children: N/A  . Years of education: N/A   Occupational History  . 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 Smoking status: Never Smoker  . Smokeless tobacco: Not on file  . Alcohol use 0.0 oz/week     Comment: two times a month  . Drug use: No  . Sexual activity: Not on file     Comment: lives with husband, moved [place], no dietary restrictions, retired Education officer, museum   Other Topics Concern  . Not on file   Social History Narrative  . No narrative on file    Outpatient Medications Prior to Visit  Medication Sig Dispense Refill  . albuterol (PROVENTIL HFA;VENTOLIN HFA) 108 (90 Base) MCG/ACT inhaler Inhale 2 puffs into the lungs every 6 (six) hours as needed for wheezing or shortness of breath. 1 Inhaler 0  . allopurinol (ZYLOPRIM) 100 MG tablet Take 1 tablet (100 mg total) by mouth daily. 30 tablet 3  . aspirin 81 MG tablet Take 81 mg by mouth daily.    . budesonide-formoterol (SYMBICORT) 80-4.5 MCG/ACT inhaler Inhale 2 puffs into the lungs 2 (two) times daily. 1 Inhaler 11  . ciclesonide (OMNARIS) 50 MCG/ACT nasal spray Place 2 sprays into both nostrils daily. 12.5 g 6  . diltiazem (  TIAZAC) 180 MG 24 hr capsule Take 1 capsule (180 mg total) by mouth daily. 90 capsule 2  . hydrOXYzine (ATARAX/VISTARIL) 25 MG tablet 1tab po q hs as needed itching 10 tablet 0  . lansoprazole (PREVACID) 30 MG capsule Take 1 capsule (30 mg total) by mouth 2 (two) times daily. 60 capsule 6  . montelukast (SINGULAIR) 10 MG tablet Take 1 tablet (10 mg total) by mouth at bedtime as needed. 30 tablet 3  . Multiple Vitamin (MULTIVITAMIN) tablet Take 1 tablet by mouth daily.    . propranolol (INDERAL) 20 MG tablet Take 1 tablet (20 mg total) by mouth 2 (two) times daily. 180 tablet 1  . Red Yeast Rice Extract (RED YEAST RICE PO) Take by mouth daily.    Marland Kitchen triamterene-hydrochlorothiazide (DYAZIDE) 37.5-25 MG capsule Take 1 each (1 capsule total) by mouth daily. 90 capsule 2   Facility-Administered Medications Prior to Visit  Medication Dose Route Frequency Provider  Last Rate Last Dose  . albuterol (PROVENTIL) (2.5 MG/3ML) 0.083% nebulizer solution 2.5 mg  2.5 mg Nebulization Once Amanda C Copland, MD      . ipratropium (ATROVENT) nebulizer solution 0.5 mg  0.5 mg Nebulization Once Darreld Mclean, MD        Allergies  Allergen Reactions  . Amoxicillin Rash  . Ampicillin Rash  . Penicillins Rash  . Statins Other (See Comments)    Per reports muscle aches and joint pain    Review of Systems  Constitutional: Negative for fever and malaise/fatigue.  HENT: Negative for congestion.   Eyes: Negative for blurred vision.  Respiratory: Negative for shortness of breath.   Cardiovascular: Negative for chest pain, palpitations and leg swelling.  Gastrointestinal: Negative for abdominal pain, blood in stool and nausea.  Genitourinary: Negative for dysuria and frequency.  Musculoskeletal: Negative for falls.  Skin: Negative for rash.  Neurological: Negative for dizziness, loss of consciousness and headaches.  Endo/Heme/Allergies: Negative for environmental allergies.  Psychiatric/Behavioral: Negative for depression. The patient is not nervous/anxious.        Objective:    Physical Exam  Constitutional: She is oriented to person, place, and time. She appears well-developed and well-nourished. No distress.  HENT:  Head: Normocephalic and atraumatic.  Nose: Nose normal.  Eyes: Right eye exhibits no discharge. Left eye exhibits no discharge.  Neck: Normal range of motion. Neck supple.  Cardiovascular: Normal rate and regular rhythm.   No murmur heard. Pulmonary/Chest: Effort normal and breath sounds normal.  Abdominal: Soft. Bowel sounds are normal. There is no tenderness.  Musculoskeletal: She exhibits no edema.  Neurological: She is alert and oriented to person, place, and time.  Skin: Skin is warm and dry.  Psychiatric: She has a normal mood and affect.  Nursing note and vitals reviewed.   BP 120/76 (BP Location: Left Arm, Patient Position:  Sitting, Cuff Size: Normal)   Pulse 66   Temp 98 F (36.7 C) (Oral)   Ht 5\' 2"  (1.575 m)   Wt 242 lb 4 oz (109.9 kg)   BMI 44.31 kg/m  Wt Readings from Last 3 Encounters:  11/09/15 242 lb 4 oz (109.9 kg)  10/09/15 237 lb (107.5 kg)  09/18/15 241 lb 6.4 oz (109.5 kg)     Lab Results  Component Value Date   WBC 7.3 08/07/2015   HGB 13.6 08/07/2015   HCT 40.3 08/07/2015   PLT 367.0 08/07/2015   GLUCOSE 97 09/18/2015   CHOL 205 (H) 08/07/2015   TRIG 126.0 08/07/2015  HDL 60.30 08/07/2015   LDLCALC 119 (H) 08/07/2015   ALT 12 09/18/2015   AST 17 09/18/2015   NA 139 09/18/2015   K 3.9 09/18/2015   CL 100 09/18/2015   CREATININE 0.89 09/18/2015   BUN 22 09/18/2015   CO2 30 09/18/2015   TSH 2.43 04/21/2015   HGBA1C 5.7 08/07/2015    Lab Results  Component Value Date   TSH 2.43 04/21/2015   Lab Results  Component Value Date   WBC 7.3 08/07/2015   HGB 13.6 08/07/2015   HCT 40.3 08/07/2015   MCV 89.0 08/07/2015   PLT 367.0 08/07/2015   Lab Results  Component Value Date   NA 139 09/18/2015   K 3.9 09/18/2015   CO2 30 09/18/2015   GLUCOSE 97 09/18/2015   BUN 22 09/18/2015   CREATININE 0.89 09/18/2015   BILITOT 0.4 09/18/2015   ALKPHOS 81 09/18/2015   AST 17 09/18/2015   ALT 12 09/18/2015   PROT 7.1 09/18/2015   ALBUMIN 4.0 09/18/2015   CALCIUM 9.8 09/18/2015   GFR 65.58 09/18/2015   Lab Results  Component Value Date   CHOL 205 (H) 08/07/2015   Lab Results  Component Value Date   HDL 60.30 08/07/2015   Lab Results  Component Value Date   LDLCALC 119 (H) 08/07/2015   Lab Results  Component Value Date   TRIG 126.0 08/07/2015   Lab Results  Component Value Date   CHOLHDL 3 08/07/2015   Lab Results  Component Value Date   HGBA1C 5.7 08/07/2015       Assessment & Plan:   Problem List Items Addressed This Visit    Essential hypertension, benign    Well controlled, no changes to meds. Encouraged heart healthy diet such as the DASH diet and  exercise as tolerated.       Relevant Orders   TSH   Comprehensive metabolic panel   CBC   Mixed hyperlipidemia    Encouraged heart healthy diet, increase exercise, avoid trans fats, consider a krill oil cap daily      Relevant Orders   Lipid panel   Lumbago    Left lower back about a week ago. Iced it and it is doing better. Encouraged moist heat and gentle stretching as tolerated. May try NSAIDs and prescription meds as directed and report if symptoms worsen or seek immediate care. Try Lidocaine patches      Hyperglycemia    hgba1c acceptable, minimize simple carbs. Increase exercise as tolerated.       Relevant Orders   Hemoglobin A1c   Gout    No recent flares check levels      Relevant Orders   Uric acid    Other Visit Diagnoses    Encounter for immunization       Relevant Orders   Flu vaccine HIGH DOSE PF (Completed)      I am having Ms. Lazzaro maintain her multivitamin, Red Yeast Rice Extract (RED YEAST RICE PO), aspirin, ciclesonide, budesonide-formoterol, lansoprazole, albuterol, montelukast, allopurinol, hydrOXYzine, diltiazem, triamterene-hydrochlorothiazide, and propranolol. We will continue to administer albuterol and ipratropium.  No orders of the defined types were placed in this encounter.    [name], MD

## 2015-11-09 NOTE — Assessment & Plan Note (Signed)
hgba1c acceptable, minimize simple carbs. Increase exercise as tolerated.  

## 2015-11-09 NOTE — Assessment & Plan Note (Signed)
Well controlled, no changes to meds. Encouraged heart healthy diet such as the DASH diet and exercise as tolerated.  °

## 2015-11-09 NOTE — Assessment & Plan Note (Addendum)
Left lower back about a week ago. Iced it and it is doing better. Encouraged moist heat and gentle stretching as tolerated. May try NSAIDs and prescription meds as directed and report if symptoms worsen or seek immediate care. Try Lidocaine patches

## 2015-11-09 NOTE — Assessment & Plan Note (Signed)
Encouraged heart healthy diet, increase exercise, avoid trans fats, consider a krill oil cap daily 

## 2015-11-09 NOTE — Patient Instructions (Signed)
Consider Lidocaine patches by Aspercreme, Salon Pas, or Icy Hot Back Pain, Adult Back pain is very common in adults.The cause of back pain is rarely dangerous and the pain often gets better over time.The cause of your back pain may not be known. Some common causes of back pain include:  Strain of the muscles or ligaments supporting the spine.  Wear and tear (degeneration) of the spinal disks.  Arthritis.  Direct injury to the back. For many people, back pain may return. Since back pain is rarely dangerous, most people can learn to manage this condition on their own. HOME CARE INSTRUCTIONS Watch your back pain for any changes. The following actions may help to lessen any discomfort you are feeling:  Remain active. It is stressful on your back to sit or stand in one place for long periods of time. Do not sit, drive, or stand in one place for more than 30 minutes at a time. Take short walks on even surfaces as soon as you are able.Try to increase the length of time you walk each day.  Exercise regularly as directed by your health care provider. Exercise helps your back heal faster. It also helps avoid future injury by keeping your muscles strong and flexible.  Do not stay in bed.Resting more than 1-2 days can delay your recovery.  Pay attention to your body when you bend and lift. The most comfortable positions are those that put less stress on your recovering back. Always use proper lifting techniques, including:  Bending your knees.  Keeping the load close to your body.  Avoiding twisting.  Find a comfortable position to sleep. Use a firm mattress and lie on your side with your knees slightly bent. If you lie on your back, put a pillow under your knees.  Avoid feeling anxious or stressed.Stress increases muscle tension and can worsen back pain.It is important to recognize when you are anxious or stressed and learn ways to manage it, such as with exercise.  Take medicines only as  directed by your health care provider. Over-the-counter medicines to reduce pain and inflammation are often the most helpful.Your health care provider may prescribe muscle relaxant drugs.These medicines help dull your pain so you can more quickly return to your normal activities and healthy exercise.  Apply ice to the injured area:  Put ice in a plastic bag.  Place a towel between your skin and the bag.  Leave the ice on for 20 minutes, 2-3 times a day for the first 2-3 days. After that, ice and heat may be alternated to reduce pain and spasms.  Maintain a healthy weight. Excess weight puts extra stress on your back and makes it difficult to maintain good posture. SEEK MEDICAL CARE IF:  You have pain that is not relieved with rest or medicine.  You have increasing pain going down into the legs or buttocks.  You have pain that does not improve in one week.  You have night pain.  You lose weight.  You have a fever or chills. SEEK IMMEDIATE MEDICAL CARE IF:   You develop new bowel or bladder control problems.  You have unusual weakness or numbness in your arms or legs.  You develop nausea or vomiting.  You develop abdominal pain.  You feel faint.   This information is not intended to replace advice given to you by your health care provider. Make sure you discuss any questions you have with your health care provider.   Document Released: 01/24/2005 Document Revised:  02/14/2014 Document Reviewed: 05/28/2013 Elsevier Interactive Patient Education [employer].

## 2015-11-09 NOTE — Progress Notes (Signed)
Pre visit review using our clinic review tool, if applicable. No additional management support is needed unless otherwise documented below in the visit note. 

## 2015-11-09 NOTE — Assessment & Plan Note (Signed)
Encouraged DASH diet, decrease po intake and increase exercise as tolerated. Needs 7-8 hours of sleep nightly. Avoid trans fats, eat small, frequent meals every 4-5 hours with lean proteins, complex carbs and healthy fats. Minimize simple carbs 

## 2015-11-09 NOTE — Addendum Note (Signed)
Addended by: Sharon Seller B on: 11/09/2015 08:57 AM   Modules accepted: Orders

## 2015-11-10 ENCOUNTER — Other Ambulatory Visit (INDEPENDENT_AMBULATORY_CARE_PROVIDER_SITE_OTHER): Payer: Medicare Other

## 2015-11-10 ENCOUNTER — Other Ambulatory Visit: Payer: Self-pay | Admitting: Family Medicine

## 2015-11-10 DIAGNOSIS — R946 Abnormal results of thyroid function studies: Secondary | ICD-10-CM

## 2015-11-10 LAB — URIC ACID: Uric Acid, Serum: 8.7 mg/dL — ABNORMAL HIGH (ref 2.4–7.0)

## 2015-11-10 LAB — T4, FREE: Free T4: 0.98 ng/dL (ref 0.60–1.60)

## 2015-11-10 MED ORDER — ALLOPURINOL 100 MG PO TABS: 100.0000 mg | ORAL_TABLET | Freq: Two times a day (BID) | ORAL | 3 refills | Status: DC

## 2015-11-27 ENCOUNTER — Telehealth: Payer: Self-pay | Admitting: Family Medicine

## 2015-11-27 ENCOUNTER — Encounter: Payer: Self-pay | Admitting: Medical

## 2015-11-27 ENCOUNTER — Ambulatory Visit (HOSPITAL_BASED_OUTPATIENT_CLINIC_OR_DEPARTMENT_OTHER)
Admission: RE | Admit: 2015-11-27 | Discharge: 2015-11-27 | Disposition: A | Discharge status: Home / Self Care | Payer: Medicare Other | Source: Ambulatory Visit | Attending: Medical | Admitting: Medical

## 2015-11-27 ENCOUNTER — Ambulatory Visit (INDEPENDENT_AMBULATORY_CARE_PROVIDER_SITE_OTHER): Payer: Medicare Other | Admitting: Medical

## 2015-11-27 VITALS — BP 120/70 | HR 77 | Temp 98.0°F | Ht 61.0 in | Wt 242.6 lb

## 2015-11-27 DIAGNOSIS — R05 Cough: Secondary | ICD-10-CM | POA: Diagnosis not present

## 2015-11-27 DIAGNOSIS — J209 Acute bronchitis, unspecified: Secondary | ICD-10-CM

## 2015-11-27 DIAGNOSIS — R059 Cough, unspecified: Secondary | ICD-10-CM

## 2015-11-27 DIAGNOSIS — R062 Wheezing: Secondary | ICD-10-CM | POA: Insufficient documentation

## 2015-11-27 MED ORDER — PREDNISONE 10 MG PO TABS: ORAL_TABLET | ORAL | 0 refills | Status: DC

## 2015-11-27 MED ORDER — DOXYCYCLINE HYCLATE 100 MG PO TABS: 100.0000 mg | ORAL_TABLET | Freq: Two times a day (BID) | ORAL | 0 refills | Status: DC

## 2015-11-27 MED ORDER — HYDROCODONE-HOMATROPINE 5-1.5 MG/5ML PO SYRP: 5.0000 mL | ORAL_SOLUTION | Freq: Three times a day (TID) | ORAL | 0 refills | Status: DC | PRN

## 2015-11-27 NOTE — Telephone Encounter (Signed)
Error

## 2015-11-27 NOTE — Progress Notes (Signed)
Subjective:    Patient ID: Amanda Copeland, female    DOB: Sep 15, 1940, 75 y.o.   MRN: [id number]  HPI  Pt in for evaluation.   Pt is convinced she is getting early bronchitis. She states she gets bronchitis each year and can feel the chest congestion started. Faint wheezing last night and chest congestion. Some mild brown colored mucous last night. Felt mild warm last night.   Pt has symbicort and albuterol. She occasionally use montelucast.  She states just started to wheeze last night. None presently on exam.  Pt leaving on cruise this coming Monday and does not want to be sick. States on time her husband had to be treated on board cruise ship for pneumonia. Cost of that was $4,000.    Review of Systems  Constitutional: Positive for fever. Negative for chills and fatigue.       Subjective faint warmth last night.  HENT: Negative for congestion.   Respiratory: Positive for cough and wheezing. Negative for shortness of breath.        Feel like can't take full deep breath.  Cardiovascular: Negative for chest pain, palpitations and leg swelling.  Gastrointestinal: Negative for abdominal pain.  Musculoskeletal: Negative for arthralgias, back pain, myalgias and neck pain.       No leg pain. No popliteal pain.  Skin: Negative for rash.  Neurological: Negative for dizziness, speech difficulty, weakness and headaches.  Hematological: Negative for adenopathy. Does not bruise/bleed easily.    Past Medical History:  Diagnosis Date  . Arthritis   . Asthma    childhood now returning  . Back pain affecting pregnancy 11/02/2014  . Benign essential tremor 10/28/2014  . Bilateral dry eyes   . Cancer (HCC)    Breast  . Dry eyes   . Essential hypertension, benign 10/28/2014  . H/O measles   . H/O mumps   . History of chicken pox   . Lumbago 11/02/2014  . Mixed hyperlipidemia 10/28/2014  . Obesity 10/28/2014     Social History   Social History  . Marital status: Married    Spouse name:  N/A  . Number of children: N/A  . Years of education: N/A   Occupational History  . 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 Smoking status: Never Smoker  . Smokeless tobacco: Not on file  . Alcohol use 0.0 oz/week     Comment: two times a month  . Drug use: No  . Sexual activity: Not on file     Comment: lives with husband, moved [place], no dietary restrictions, retired Education officer, [employer]   Other Topics Concern  . Not on file   Social History Narrative  . No narrative on file    Past Surgical History:  Procedure Laterality Date  . BACK SURGERY    . BREAST REDUCTION SURGERY Left   . CHOLECYSTECTOMY    . EYE SURGERY  2008   b/l cataracts removed, in [place]  . MASTECTOMY Right   . REPLACEMENT TOTAL KNEE BILATERAL Bilateral   . ROTATOR CUFF REPAIR Left     Family History  Problem Relation Age of Onset  . Heart disease Mother   . Cancer Father     colon  . Asthma Father   . Parkinson's disease Father   . Cancer Maternal Grandmother     uterine  . Heart disease Maternal Grandfather   . Obesity Daughter   . Appendicitis Paternal Grandmother   .  Heart attack Mother   . Heart failure Mother   . Hypertension Mother   . Stroke Neg Hx     Allergies  Allergen Reactions  . Amoxicillin Rash  . Ampicillin Rash  . Penicillins Rash  . Statins Other (See Comments)    Per reports muscle aches and joint pain    Current Outpatient Prescriptions on File Prior to Visit  Medication Sig Dispense Refill  . albuterol (PROVENTIL HFA;VENTOLIN HFA) 108 (90 Base) MCG/ACT inhaler Inhale 2 puffs into the lungs every 6 (six) hours as needed for wheezing or shortness of breath. 1 Inhaler 0  . allopurinol (ZYLOPRIM) 100 MG tablet Take 1 tablet (100 mg total) by mouth 2 (two) times daily. 60 tablet 3  . aspirin 81 MG tablet Take 81 mg by mouth daily.    . budesonide-formoterol (SYMBICORT) 80-4.5 MCG/ACT inhaler Inhale 2 puffs into the lungs 2 (two) times daily. 1  Inhaler 11  . ciclesonide (OMNARIS) 50 MCG/ACT nasal spray Place 2 sprays into both nostrils daily. 12.5 g 6  . diltiazem (TIAZAC) 180 MG 24 hr capsule Take 1 capsule (180 mg total) by mouth daily. 90 capsule 2  . hydrOXYzine (ATARAX/VISTARIL) 25 MG tablet 1tab po q hs as needed itching 10 tablet 0  . lansoprazole (PREVACID) 30 MG capsule Take 1 capsule (30 mg total) by mouth 2 (two) times daily. 60 capsule 6  . montelukast (SINGULAIR) 10 MG tablet Take 1 tablet (10 mg total) by mouth at bedtime as needed. 30 tablet 3  . Multiple Vitamin (MULTIVITAMIN) tablet Take 1 tablet by mouth daily.    . propranolol (INDERAL) 20 MG tablet Take 1 tablet (20 mg total) by mouth 2 (two) times daily. 180 tablet 1  . Red Yeast Rice Extract (RED YEAST RICE PO) Take by mouth daily.    Marland Kitchen triamterene-hydrochlorothiazide (DYAZIDE) 37.5-25 MG capsule Take 1 each (1 capsule total) by mouth daily. 90 capsule 2   Current Facility-Administered Medications on File Prior to Visit  Medication Dose Route Frequency Provider Last Rate Last Dose  . albuterol (PROVENTIL) (2.5 MG/3ML) 0.083% nebulizer solution 2.5 mg  2.5 mg Nebulization Once Jessica C Copland, MD      . ipratropium (ATROVENT) nebulizer solution 0.5 mg  0.5 mg Nebulization Once Jessica C Copland, MD        BP 120/70   Pulse 77   Temp 98 F (36.7 C) (Oral)   Ht 5\' 1"  (1.549 m)   Wt 242 lb 9.6 oz (110 kg)   SpO2 96%   BMI 45.84 kg/m       Objective:   Physical Exam  General  Mental Status - Alert. General Appearance - Well groomed. Not in acute distress.  Skin Rashes- No Rashes.  HEENT Head- Normal. Ear Auditory Canal - Left- Normal. Right - Normal.Tympanic Membrane- Left- Normal. Right- Normal. Eye Sclera/Conjunctiva- Left- Normal. Right- Normal. Nose & Sinuses Nasal Mucosa- Left-  Not Boggy and no tCongested. Right-   Not Boggy and not Congested.Bilateral no maxillary and  No frontal sinus pressure. Mouth & Throat Lips: Upper Lip- Normal:  no dryness, cracking, pallor, cyanosis, or vesicular eruption. Lower Lip-Normal: no dryness, cracking, pallor, cyanosis or vesicular eruption. Buccal Mucosa- Bilateral- No Aphthous ulcers. Oropharynx- No Discharge or Erythema. Tonsils: Characteristics- Bilateral- No Erythema or Congestion. Size/Enlargement- Bilateral- No enlargement. Discharge- bilateral-None.  Neck Neck- Supple. No Masses.   Chest and Lung Exam Auscultation: Breath Sounds:-even and unlabored but shallow.(possible faint lower lobe rhonchi on ausculation bilaterally)  Cardiovascular Auscultation:Rythm- Regular, rate and rhythm. Murmurs & Other Heart Sounds:Ausculatation of the heart reveal- No Murmurs.  Lymphatic Head & Neck General Head & Neck Lymphatics: Bilateral: Description- No Localized lymphadenopathy.  Lower ext- no pedal edema. Negative homans sign.      Assessment & Plan:  You appear to have bronchitis with wheezing. Rest hydrate and tylenol for fever. I am prescribing cough medicine hycodan, and doxycycline antibiotic.   For wheezing continue your inhalers.   cxr today please.  For severe wheezing while on your trip making tapered prednisone available.. Benefit vs side effect discussed. I would recommend filling and having on hand in event you need. Might prevent you from being seen by med staff on board ship. But if you need to be seen by them then strongly recommend.  Pt states in past she suffered from insomnia if she took prednisone but report no other side effect or allergic type reaction. She will gone for 2 weeks. Thus I wanted to make it available. Also counseled pt that in event she wheezes a lot then she would need to be seen by staff on board cruise ship. She had mentioned extreme cost of that for her in past so I wanted to help her out and make tapered prednisone available.However did explain be seen by med staff on board if needed.  Follow up in 7-10 days or as needed

## 2015-11-27 NOTE — Progress Notes (Signed)
Pre visit review using our clinic review tool, if applicable. No additional management support is needed unless otherwise documented below in the visit note. 

## 2015-11-27 NOTE — Patient Instructions (Addendum)
You appear to have bronchitis with wheezing. Rest hydrate and tylenol for fever. I am prescribing cough medicine hycodan, and doxycycline antibiotic.   For wheezing continue your albuterol and symbicort inhalers.   cxr today please.  For severe wheezing while on your trip making tapered prednisone available.. Benefit vs side effect discussed. I would recommend filling and having on hand in event you need. Might prevent you from being seen by med staff on board ship. But if you need to be seen by them then strongly recommend.  Follow up in 7-10 days or as needed

## 2015-12-28 ENCOUNTER — Encounter: Payer: Self-pay | Admitting: Family Medicine

## 2015-12-28 ENCOUNTER — Ambulatory Visit (INDEPENDENT_AMBULATORY_CARE_PROVIDER_SITE_OTHER): Payer: Medicare Other | Admitting: Family Medicine

## 2015-12-28 VITALS — BP 112/74 | HR 67 | Temp 97.9°F | Ht 61.0 in | Wt 243.0 lb

## 2015-12-28 DIAGNOSIS — J4521 Mild intermittent asthma with (acute) exacerbation: Secondary | ICD-10-CM | POA: Diagnosis not present

## 2015-12-28 MED ORDER — BUDESONIDE 90 MCG/ACT IN AEPB: 2.0000 | INHALATION_SPRAY | Freq: Two times a day (BID) | RESPIRATORY_TRACT | 1 refills | Status: DC

## 2015-12-28 NOTE — Progress Notes (Signed)
Pre visit review using our clinic review tool, if applicable. No additional management support is needed unless otherwise documented below in the visit note. 

## 2015-12-28 NOTE — Progress Notes (Signed)
Chief Complaint  Patient presents with  . Nasal Congestion    Pt reports cough with wheezing and SOB Pt states just finishing doxycycline    Shawnee Knapp here for URI complaints.  Duration: 1 week  Associated symptoms: shortness of breath and dry cough Denies: sinus congestion, rhinorrhea, itchy watery eyes, ear pain, ear drainage and sore throat Treatment to date: fluids and rest; Doxycycline.  Sick contacts: No +Hx of asthma, mild intermittent. Albuterol has been of little help.  ROS:  Const: Denies fevers HEENT: As noted in HPI Lungs: +SOB  Past Medical History:  Diagnosis Date  . Arthritis   . Asthma    childhood now returning  . Back pain affecting pregnancy 11/02/2014  . Benign essential tremor 10/28/2014  . Bilateral dry eyes   . Cancer (HCC)    Breast  . Dry eyes   . Essential hypertension, benign 10/28/2014  . H/O measles   . H/O mumps   . History of chicken pox   . Lumbago 11/02/2014  . Mixed hyperlipidemia 10/28/2014  . Obesity 10/28/2014   Family History  Problem Relation Age of Onset  . Heart disease Mother   . Cancer Father     colon  . Asthma Father   . Parkinson's disease Father   . Cancer Maternal Grandmother     uterine  . Heart disease Maternal Grandfather   . Obesity Daughter   . Appendicitis Paternal Grandmother   . Heart attack Mother   . Heart failure Mother   . Hypertension Mother   . Stroke Neg Hx     BP 112/74 (BP Location: Left Arm, Patient Position: Sitting, Cuff Size: Large)   Pulse 67   Temp 97.9 F (36.6 C) (Oral)   Ht 5\' 1"  (1.549 m)   Wt 243 lb (110.2 kg)   SpO2 97%   BMI 45.91 kg/m  General: Awake, alert, appears stated age HEENT: AT, [place], ears patent b/l and TM's neg, nares patent w/o discharge, pharynx pink and without exudates, MMM Neck: No masses or asymmetry Heart: RRR, no murmurs, no bruits Lungs: CTAB, no accessory muscle use Psych: Age appropriate judgment and insight, normal mood and affect  Mild intermittent  asthma with acute exacerbation - Plan: Budesonide (PULMICORT FLEXHALER) 90 MCG/ACT inhaler  Orders as above. Likely not dealing with bacterial etiology as Doxy course was unhelpful. 10 days of ICS. F/u in 1 week if symptoms worsen or fail to improve. Pt voiced understanding and agreement to the plan.  [place], DO 12/28/15 3:52 PM

## 2015-12-28 NOTE — Patient Instructions (Signed)
Remember to rinse your mouth out after using your inhaler (Pulmicort).

## 2015-12-30 ENCOUNTER — Encounter: Payer: Self-pay | Admitting: Family Medicine

## 2016-02-15 ENCOUNTER — Encounter: Payer: Self-pay | Admitting: Family Medicine

## 2016-02-15 ENCOUNTER — Ambulatory Visit (INDEPENDENT_AMBULATORY_CARE_PROVIDER_SITE_OTHER): Payer: Medicare Other | Admitting: Family Medicine

## 2016-02-15 ENCOUNTER — Other Ambulatory Visit: Payer: Self-pay | Admitting: Family Medicine

## 2016-02-15 DIAGNOSIS — K219 Gastro-esophageal reflux disease without esophagitis: Secondary | ICD-10-CM

## 2016-02-15 DIAGNOSIS — E782 Mixed hyperlipidemia: Secondary | ICD-10-CM

## 2016-02-15 DIAGNOSIS — J4521 Mild intermittent asthma with (acute) exacerbation: Secondary | ICD-10-CM

## 2016-02-15 DIAGNOSIS — R739 Hyperglycemia, unspecified: Secondary | ICD-10-CM | POA: Diagnosis not present

## 2016-02-15 DIAGNOSIS — T7840XD Allergy, unspecified, subsequent encounter: Secondary | ICD-10-CM

## 2016-02-15 DIAGNOSIS — I10 Essential (primary) hypertension: Secondary | ICD-10-CM

## 2016-02-15 DIAGNOSIS — E669 Obesity, unspecified: Secondary | ICD-10-CM

## 2016-02-15 DIAGNOSIS — M109 Gout, unspecified: Secondary | ICD-10-CM | POA: Diagnosis not present

## 2016-02-15 LAB — COMPREHENSIVE METABOLIC PANEL
ALBUMIN: 4.1 g/dL (ref 3.5–5.2)
ALK PHOS: 85 U/L (ref 39–117)
ALT: 12 U/L (ref 0–35)
AST: 16 U/L (ref 0–37)
BILIRUBIN TOTAL: 0.3 mg/dL (ref 0.2–1.2)
BUN: 22 mg/dL (ref 6–23)
CALCIUM: 9.9 mg/dL (ref 8.4–10.5)
CO2: 29 mEq/L (ref 19–32)
CREATININE: 0.87 mg/dL (ref 0.40–1.20)
Chloride: 103 mEq/L (ref 96–112)
GFR: 67.25 mL/min (ref >60.00)
Glucose, Bld: 82 mg/dL (ref 70–99)
Potassium: 4 mEq/L (ref 3.5–5.1)
SODIUM: 142 meq/L (ref 135–145)
TOTAL PROTEIN: 7.3 g/dL (ref 6.0–8.3)

## 2016-02-15 LAB — CBC
HCT: 41.8 % (ref 36.0–46.0)
Hemoglobin: 14 g/dL (ref 12.0–15.0)
MCHC: 33.5 g/dL (ref 30.0–36.0)
MCV: 91.6 fl (ref 78.0–100.0)
PLATELETS: 345 10*3/uL (ref 150.0–400.0)
RBC: 4.57 Mil/uL (ref 3.87–5.11)
RDW: 14.9 % (ref 11.5–15.5)
WBC: 8.2 10*3/uL (ref 4.0–10.5)

## 2016-02-15 LAB — LIPID PANEL
CHOLESTEROL: 216 mg/dL — AB (ref 0–200)
HDL: 69.5 mg/dL (ref >39.00)
LDL Cholesterol: 126 mg/dL — ABNORMAL HIGH (ref 0–99)
NonHDL: 146.95
TRIGLYCERIDES: 107 mg/dL (ref 0.0–149.0)
Total CHOL/HDL Ratio: 3
VLDL: 21.4 mg/dL (ref 0.0–40.0)

## 2016-02-15 LAB — URIC ACID: URIC ACID, SERUM: 4.9 mg/dL (ref 2.4–7.0)

## 2016-02-15 LAB — HEMOGLOBIN A1C: Hgb A1c MFr Bld: 5.7 % (ref 4.6–6.5)

## 2016-02-15 MED ORDER — ALLOPURINOL 100 MG PO TABS: 100.0000 mg | ORAL_TABLET | Freq: Two times a day (BID) | ORAL | 3 refills | Status: DC

## 2016-02-15 MED ORDER — BUDESONIDE 90 MCG/ACT IN AEPB: 2.0000 | INHALATION_SPRAY | Freq: Two times a day (BID) | RESPIRATORY_TRACT | 1 refills | Status: DC

## 2016-02-15 MED ORDER — DILTIAZEM HCL ER BEADS 180 MG PO CP24: 180.0000 mg | ORAL_CAPSULE | Freq: Every day | ORAL | 2 refills | Status: DC

## 2016-02-15 MED ORDER — TRIAMTERENE-HCTZ 37.5-25 MG PO CAPS: 1.0000 | ORAL_CAPSULE | Freq: Every day | ORAL | 2 refills | Status: DC

## 2016-02-15 NOTE — Progress Notes (Signed)
Patient ID: Amanda Copeland, female   DOB: [birth year], 76 y.o.   MRN: [id number]

## 2016-02-15 NOTE — Assessment & Plan Note (Signed)
Encouraged DASH diet, decrease po intake and increase exercise as tolerated. Needs 7-8 hours of sleep nightly. Avoid trans fats, eat small, frequent meals every 4-5 hours with lean proteins, complex carbs and healthy fats. Minimize simple carbs 

## 2016-02-15 NOTE — Progress Notes (Signed)
Pre visit review using our clinic review tool, if applicable. No additional management support is needed unless otherwise documented below in the visit note. 

## 2016-02-15 NOTE — Assessment & Plan Note (Signed)
Well controlled, no changes to meds. Encouraged heart healthy diet such as the DASH diet and exercise as tolerated.  °

## 2016-02-15 NOTE — Assessment & Plan Note (Signed)
Uses Omnaris prn infrequently

## 2016-02-15 NOTE — Assessment & Plan Note (Signed)
Avoid offending foods, start probiotics. Do not eat large meals in late evening and consider raising head of bed.  

## 2016-02-15 NOTE — Patient Instructions (Signed)
DASH Eating Plan DASH stands for "Dietary Approaches to Stop Hypertension." The DASH eating plan is a healthy eating plan that has been shown to reduce high blood pressure (hypertension). Additional health benefits may include reducing the risk of type 2 diabetes mellitus, heart disease, and stroke. The DASH eating plan may also help with weight loss. What do I need to know about the DASH eating plan? For the DASH eating plan, you will follow these general guidelines:  Choose foods with less than 150 milligrams of sodium per serving (as listed on the food label).  Use salt-free seasonings or herbs instead of table salt or sea salt.  Check with your health care provider or pharmacist before using salt substitutes.  Eat lower-sodium products. These are often labeled as "low-sodium" or "no salt added."  Eat fresh foods. Avoid eating a lot of canned foods.  Eat more vegetables, fruits, and low-fat dairy products.  Choose whole grains. Look for the word "whole" as the first word in the ingredient list.  Choose fish and skinless chicken or turkey more often than red meat. Limit fish, poultry, and meat to 6 oz (170 g) each day.  Limit sweets, desserts, sugars, and sugary drinks.  Choose heart-healthy fats.  Eat more home-cooked food and less restaurant, buffet, and fast food.  Limit fried foods.  Do not fry foods. Cook foods using methods such as baking, boiling, grilling, and broiling instead.  When eating at a restaurant, ask that your food be prepared with less salt, or no salt if possible. What foods can I eat? Seek help from a dietitian for individual calorie needs. Grains  Whole grain or whole wheat bread. Brown rice. Whole grain or whole wheat pasta. Quinoa, bulgur, and whole grain cereals. Low-sodium cereals. Corn or whole wheat flour tortillas. Whole grain cornbread. Whole grain crackers. Low-sodium crackers. Vegetables  Fresh or frozen vegetables (raw, steamed, roasted, or  grilled). Low-sodium or reduced-sodium tomato and vegetable juices. Low-sodium or reduced-sodium tomato sauce and paste. Low-sodium or reduced-sodium canned vegetables. Fruits  All fresh, canned (in natural juice), or frozen fruits. Meat and Other Protein Products  Ground beef (85% or leaner), grass-fed beef, or beef trimmed of fat. Skinless chicken or turkey. Ground chicken or turkey. Pork trimmed of fat. All fish and seafood. Eggs. Dried beans, peas, or lentils. Unsalted nuts and seeds. Unsalted canned beans. Dairy  Low-fat dairy products, such as skim or 1% milk, 2% or reduced-fat cheeses, low-fat ricotta or cottage cheese, or plain low-fat yogurt. Low-sodium or reduced-sodium cheeses. Fats and Oils  Tub margarines without trans fats. Light or reduced-fat mayonnaise and salad dressings (reduced sodium). Avocado. Safflower, olive, or canola oils. Natural peanut or almond butter. Other  Unsalted popcorn and pretzels. The items listed above may not be a complete list of recommended foods or beverages. Contact your dietitian for more options.  What foods are not recommended? Grains  White bread. White pasta. White rice. Refined cornbread. Bagels and croissants. Crackers that contain trans fat. Vegetables  Creamed or fried vegetables. Vegetables in a cheese sauce. Regular canned vegetables. Regular canned tomato sauce and paste. Regular tomato and vegetable juices. Fruits  Canned fruit in light or heavy syrup. Fruit juice. Meat and Other Protein Products  Fatty cuts of meat. Ribs, chicken wings, bacon, sausage, bologna, salami, chitterlings, fatback, hot dogs, bratwurst, and packaged luncheon meats. Salted nuts and seeds. Canned beans with salt. Dairy  Whole or 2% milk, cream, half-and-half, and cream cheese. Whole-fat or sweetened yogurt. Full-fat cheeses   or blue cheese. Nondairy creamers and whipped toppings. Processed cheese, cheese spreads, or cheese curds. Condiments  Onion and garlic  salt, seasoned salt, table salt, and sea salt. Canned and packaged gravies. Worcestershire sauce. Tartar sauce. Barbecue sauce. Teriyaki sauce. Soy sauce, including reduced sodium. Steak sauce. Fish sauce. Oyster sauce. Cocktail sauce. Horseradish. Ketchup and mustard. Meat flavorings and tenderizers. Bouillon cubes. Hot sauce. Tabasco sauce. Marinades. Taco seasonings. Relishes. Fats and Oils  Butter, stick margarine, lard, shortening, ghee, and bacon fat. Coconut, palm kernel, or palm oils. Regular salad dressings. Other  Pickles and olives. Salted popcorn and pretzels. The items listed above may not be a complete list of foods and beverages to avoid. Contact your dietitian for more information.  Where can I find more information? National Heart, Lung, and [employer] Institute: www.nhlbi.nih.gov/health/health-topics/topics/dash/ This information is not intended to replace advice given to you by your health care provider. Make sure you discuss any questions you have with your health care provider. Document Released: 01/13/2011 Document Revised: 07/02/2015 Document Reviewed: 11/28/2012 Else[employer] Interactive Patient Education  2017 [employer]  

## 2016-02-15 NOTE — Progress Notes (Signed)
Subjective:    Patient ID: Amanda Copeland, female    DOB: 26-Nov-1940, 76 y.o.   MRN: [id number]  Chief Complaint  Patient presents with  . Follow-up    58-month.    HPI Patient is in today for a 3 month follow up. No acute concerns noted. She feels physically well. She has lost several very close family and friends lately. Her son in law is being treated for osteosarcoma at age 23 yrs of age and has just had an amputation. Yesterday a very close older friend died. She is tearful. Declines meds. Denies polyuria or polydipsia. No recent gout flares. Denies CP/palp/SOB/HA/congestion/fevers/GI or GU c/o. Taking meds as prescribed  Past Medical History:  Diagnosis Date  . Arthritis   . Asthma    childhood now returning  . Back pain affecting pregnancy 11/02/2014  . Benign essential tremor 10/28/2014  . Bilateral dry eyes   . Cancer (HCC)    Breast  . Dry eyes   . Essential hypertension, benign 10/28/2014  . H/O measles   . H/O mumps   . History of chicken pox   . Lumbago 11/02/2014  . Mixed hyperlipidemia 10/28/2014  . Obesity 10/28/2014    Past Surgical History:  Procedure Laterality Date  . BACK SURGERY    . BREAST REDUCTION SURGERY Left   . CHOLECYSTECTOMY    . EYE SURGERY  2008   b/l cataracts removed, in [place]  . MASTECTOMY Right   . REPLACEMENT TOTAL KNEE BILATERAL Bilateral   . ROTATOR CUFF REPAIR Left     Family History  Problem Relation Age of Onset  . Heart disease Mother   . Heart attack Mother   . Heart failure Mother   . Hypertension Mother   . Cancer Father     colon  . Asthma Father   . Parkinson's disease Father   . Cancer Maternal Grandmother     uterine  . Heart disease Maternal Grandfather   . Obesity Daughter   . Appendicitis Paternal Grandmother   . Stroke Neg Hx     Social History   Social History  . Marital status: Married    Spouse name: N/A  . Number of children: N/A  . Years of education: N/A   Occupational History  . 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 Smoking status: Never Smoker  . Smokeless tobacco: Not on file  . Alcohol use 0.0 oz/week     Comment: two times a month  . Drug use: No  . Sexual activity: Not on file     Comment: lives with husband, moved [place], no dietary restrictions, retired Education officer, museum   Other Topics Concern  . Not on file   Social History Narrative  . No narrative on file    Outpatient Medications Prior to Visit  Medication Sig Dispense Refill  . albuterol (PROVENTIL HFA;VENTOLIN HFA) 108 (90 Base) MCG/ACT inhaler Inhale 2 puffs into the lungs every 6 (six) hours as needed for wheezing or shortness of breath. 1 Inhaler 0  . aspirin 81 MG tablet Take 81 mg by mouth daily.    . budesonide-formoterol (SYMBICORT) 80-4.5 MCG/ACT inhaler Inhale 2 puffs into the lungs 2 (two) times daily. 1 Inhaler 11  . ciclesonide (OMNARIS) 50 MCG/ACT nasal spray Place 2 sprays into both nostrils daily. 12.5 g 6  . lansoprazole (PREVACID) 30 MG capsule Take 1 capsule (30 mg total) by mouth 2 (two)  times daily. 60 capsule 6  . Multiple Vitamin (MULTIVITAMIN) tablet Take 1 tablet by mouth daily.    . propranolol (INDERAL) 20 MG tablet Take 1 tablet (20 mg total) by mouth 2 (two) times daily. 180 tablet 1  . Red Yeast Rice Extract (RED YEAST RICE PO) Take by mouth daily.    Marland Kitchen allopurinol (ZYLOPRIM) 100 MG tablet Take 1 tablet (100 mg total) by mouth 2 (two) times daily. 60 tablet 3  . Budesonide (PULMICORT FLEXHALER) 90 MCG/ACT inhaler Inhale 2 puffs into the lungs 2 (two) times daily. 1 Inhaler 1  . diltiazem (TIAZAC) 180 MG 24 hr capsule Take 1 capsule (180 mg total) by mouth daily. 90 capsule 2  . triamterene-hydrochlorothiazide (DYAZIDE) 37.5-25 MG capsule Take 1 each (1 capsule total) by mouth daily. 90 capsule 2  . HYDROcodone-homatropine (HYCODAN) 5-1.5 MG/5ML syrup Take 5 mLs by mouth every 8 (eight) hours as needed for cough. (Patient not taking: Reported on  02/15/2016) 120 mL 0  . hydrOXYzine (ATARAX/VISTARIL) 25 MG tablet 1tab po q hs as needed itching (Patient not taking: Reported on 02/15/2016) 10 tablet 0  . montelukast (SINGULAIR) 10 MG tablet Take 1 tablet (10 mg total) by mouth at bedtime as needed. (Patient not taking: Reported on 02/15/2016) 30 tablet 3  . doxycycline (VIBRA-TABS) 100 MG tablet Take 1 tablet (100 mg total) by mouth 2 (two) times daily. (Patient not taking: Reported on 02/15/2016) 20 tablet 0  . predniSONE (DELTASONE) 10 MG tablet 6 tab po day 1, 5 tab po day 2, 4 tab po day 3, 3 tab po day 4, 2 tab po day 5, and 1 tab po day 6. (Patient not taking: Reported on 02/15/2016) 21 tablet 0   Facility-Administered Medications Prior to Visit  Medication Dose Route Frequency Provider Last Rate Last Dose  . albuterol (PROVENTIL) (2.5 MG/3ML) 0.083% nebulizer solution 2.5 mg  2.5 mg Nebulization Once Jessica C Copland, MD      . ipratropium (ATROVENT) nebulizer solution 0.5 mg  0.5 mg Nebulization Once Darreld Mclean, MD        Allergies  Allergen Reactions  . Amoxicillin Rash  . Ampicillin Rash  . Penicillins Rash  . Statins Other (See Comments)    Per reports muscle aches and joint pain    Review of Systems  Constitutional: Negative for fever and malaise/fatigue.  HENT: Negative for congestion.   Eyes: Negative for blurred vision.  Respiratory: Negative for cough and shortness of breath.   Cardiovascular: Negative for chest pain, palpitations and leg swelling.  Gastrointestinal: Negative for vomiting.  Musculoskeletal: Negative for back pain.  Skin: Negative for rash.  Neurological: Negative for loss of consciousness and headaches.  Psychiatric/Behavioral: The patient is not nervous/anxious.        Objective:    Physical Exam  Constitutional: She is oriented to person, place, and time. She appears well-developed and well-nourished. No distress.  HENT:  Head: Normocephalic and atraumatic.  Eyes: Conjunctivae are normal.   Neck: Normal range of motion. No thyromegaly present.  Cardiovascular: Normal rate and regular rhythm.   Pulmonary/Chest: Effort normal and breath sounds normal. She has no wheezes.  Abdominal: Soft. Bowel sounds are normal. There is no tenderness.  Musculoskeletal: She exhibits no edema or deformity.  Neurological: She is alert and oriented to person, place, and time.  Skin: Skin is warm and dry. She is not diaphoretic.  Psychiatric: She has a normal mood and affect.    BP 110/70 (BP Location:  Left Arm, Patient Position: Sitting, Cuff Size: Normal)   Pulse 65   Temp 97.7 F (36.5 C) (Oral)   Ht 5\' 1"  (1.549 m)   Wt 243 lb 9.6 oz (110.5 kg)   SpO2 95% Comment: RA  BMI 46.03 kg/m  Wt Readings from Last 3 Encounters:  02/15/16 243 lb 9.6 oz (110.5 kg)  12/28/15 243 lb (110.2 kg)  11/27/15 242 lb 9.6 oz (110 kg)     Lab Results  Component Value Date   WBC 8.2 11/09/2015   HGB 14.6 11/09/2015   HCT 42.9 11/09/2015   PLT 296.0 11/09/2015   GLUCOSE 88 11/09/2015   CHOL 222 (H) 11/09/2015   TRIG 175.0 (H) 11/09/2015   HDL 59.90 11/09/2015   LDLCALC 127 (H) 11/09/2015   ALT 11 11/09/2015   AST 17 11/09/2015   NA 143 11/09/2015   K 4.0 11/09/2015   CL 106 11/09/2015   CREATININE 0.90 11/09/2015   BUN 20 11/09/2015   CO2 27 11/09/2015   TSH 4.86 (H) 11/09/2015   HGBA1C 5.7 11/09/2015    Lab Results  Component Value Date   TSH 4.86 (H) 11/09/2015   Lab Results  Component Value Date   WBC 8.2 11/09/2015   HGB 14.6 11/09/2015   HCT 42.9 11/09/2015   MCV 89.9 11/09/2015   PLT 296.0 11/09/2015   Lab Results  Component Value Date   NA 143 11/09/2015   K 4.0 11/09/2015   CO2 27 11/09/2015   GLUCOSE 88 11/09/2015   BUN 20 11/09/2015   CREATININE 0.90 11/09/2015   BILITOT 0.3 11/09/2015   ALKPHOS 76 11/09/2015   AST 17 11/09/2015   ALT 11 11/09/2015   PROT 7.2 11/09/2015   ALBUMIN 3.7 11/09/2015   CALCIUM 9.4 11/09/2015   GFR 64.72 11/09/2015   Lab Results   Component Value Date   CHOL 222 (H) 11/09/2015   Lab Results  Component Value Date   HDL 59.90 11/09/2015   Lab Results  Component Value Date   LDLCALC 127 (H) 11/09/2015   Lab Results  Component Value Date   TRIG 175.0 (H) 11/09/2015   Lab Results  Component Value Date   CHOLHDL 4 11/09/2015   Lab Results  Component Value Date   HGBA1C 5.7 11/09/2015      I acted as a Education administrator for Dr. Charlett Blake. Raiford Noble, RMA  Assessment & Plan:   Problem List Items Addressed This Visit    Asthma    Better control on Pulmicort, has stopped Symbicort      Relevant Medications   Budesonide (PULMICORT FLEXHALER) 90 MCG/ACT inhaler   Obesity    Encouraged DASH diet, decrease po intake and increase exercise as tolerated. Needs 7-8 hours of sleep nightly. Avoid trans fats, eat small, frequent meals every 4-5 hours with lean proteins, complex carbs and healthy fats. Minimize simple carbs      Esophageal reflux    Avoid offending foods, start probiotics. Do not eat large meals in late evening and consider raising head of bed.       Essential hypertension, benign    Well controlled, no changes to meds. Encouraged heart healthy diet such as the DASH diet and exercise as tolerated.       Relevant Medications   diltiazem (TIAZAC) 180 MG 24 hr capsule   triamterene-hydrochlorothiazide (DYAZIDE) 37.5-25 MG capsule   Other Relevant Orders   CBC   Comprehensive metabolic panel   Mixed hyperlipidemia    encouraged heart healthy diet,  avoid trans fats, minimize simple carbs and saturated fats. Increase exercise as tolerated      Relevant Medications   diltiazem (TIAZAC) 180 MG 24 hr capsule   triamterene-hydrochlorothiazide (DYAZIDE) 37.5-25 MG capsule   Other Relevant Orders   Lipid panel   Hyperglycemia    hgba1c acceptable, minimize simple carbs. Increase exercise as tolerated.       Relevant Orders   Hemoglobin A1c   CBC   Comprehensive metabolic panel   Allergic    Uses Omnaris  prn infrequently      Gout    Monitor uric acid, no recent flares      Relevant Orders   Uric acid      I have discontinued Ms. Himmelsbach doxycycline and predniSONE. I am also having her maintain her multivitamin, Red Yeast Rice Extract (RED YEAST RICE PO), aspirin, ciclesonide, budesonide-formoterol, lansoprazole, albuterol, montelukast, hydrOXYzine, propranolol, HYDROcodone-homatropine, allopurinol, Budesonide, diltiazem, and triamterene-hydrochlorothiazide. We will continue to administer albuterol and ipratropium.  Meds ordered this encounter  Medications  . allopurinol (ZYLOPRIM) 100 MG tablet    Sig: Take 1 tablet (100 mg total) by mouth 2 (two) times daily.    Dispense:  60 tablet    Refill:  3  . Budesonide (PULMICORT FLEXHALER) 90 MCG/ACT inhaler    Sig: Inhale 2 puffs into the lungs 2 (two) times daily.    Dispense:  1 Inhaler    Refill:  1  . diltiazem (TIAZAC) 180 MG 24 hr capsule    Sig: Take 1 capsule (180 mg total) by mouth daily.    Dispense:  90 capsule    Refill:  2  . triamterene-hydrochlorothiazide (DYAZIDE) 37.5-25 MG capsule    Sig: Take 1 each (1 capsule total) by mouth daily.    Dispense:  90 capsule    Refill:  2    CMA served as scribe during this visit. History, Physical and Plan performed by medical provider. Documentation and orders reviewed and attested to.  [name], MD

## 2016-02-15 NOTE — Assessment & Plan Note (Signed)
Monitor uric acid, no recent flares

## 2016-02-15 NOTE — Assessment & Plan Note (Signed)
Better control on Pulmicort, has stopped Symbicort

## 2016-02-15 NOTE — Assessment & Plan Note (Signed)
hgba1c acceptable, minimize simple carbs. Increase exercise as tolerated.  

## 2016-02-15 NOTE — Assessment & Plan Note (Signed)
encouraged heart healthy diet, avoid trans fats, minimize simple carbs and saturated fats. Increase exercise as tolerated 

## 2016-02-17 DIAGNOSIS — D235 Other benign neoplasm of skin of trunk: Secondary | ICD-10-CM | POA: Diagnosis not present

## 2016-02-17 DIAGNOSIS — L821 Other seborrheic keratosis: Secondary | ICD-10-CM | POA: Diagnosis not present

## 2016-02-17 DIAGNOSIS — L570 Actinic keratosis: Secondary | ICD-10-CM | POA: Diagnosis not present

## 2016-02-17 DIAGNOSIS — L814 Other melanin hyperpigmentation: Secondary | ICD-10-CM | POA: Diagnosis not present

## 2016-02-17 DIAGNOSIS — D1801 Hemangioma of skin and subcutaneous tissue: Secondary | ICD-10-CM | POA: Diagnosis not present

## 2016-03-28 ENCOUNTER — Other Ambulatory Visit: Payer: Self-pay | Admitting: Family Medicine

## 2016-04-04 ENCOUNTER — Ambulatory Visit (INDEPENDENT_AMBULATORY_CARE_PROVIDER_SITE_OTHER): Payer: Medicare Other | Admitting: Family Medicine

## 2016-04-04 ENCOUNTER — Other Ambulatory Visit: Payer: Self-pay | Admitting: Family Medicine

## 2016-04-04 ENCOUNTER — Encounter: Payer: Self-pay | Admitting: Family Medicine

## 2016-04-04 VITALS — BP 148/59 | HR 62 | Temp 97.7°F | Ht 61.0 in | Wt 246.4 lb

## 2016-04-04 DIAGNOSIS — J209 Acute bronchitis, unspecified: Secondary | ICD-10-CM | POA: Diagnosis not present

## 2016-04-04 DIAGNOSIS — J4521 Mild intermittent asthma with (acute) exacerbation: Secondary | ICD-10-CM

## 2016-04-04 MED ORDER — PROMETHAZINE-CODEINE 6.25-10 MG/5ML PO SYRP: 5.0000 mL | ORAL_SOLUTION | Freq: Every day | ORAL | 0 refills | Status: DC

## 2016-04-04 MED ORDER — BENZONATATE 100 MG PO CAPS: 100.0000 mg | ORAL_CAPSULE | Freq: Three times a day (TID) | ORAL | 0 refills | Status: DC | PRN

## 2016-04-04 NOTE — Progress Notes (Signed)
Pre visit review using our clinic review tool, if applicable. No additional management support is needed unless otherwise documented below in the visit note. 

## 2016-04-04 NOTE — Progress Notes (Signed)
Chief Complaint  Patient presents with  . Cough    Pt reports cough since friday with sore throat and went away /Pt thinsk she has bronchitis    Shawnee Knapp here for URI complaints.  Duration: 3 days  Associated symptoms: shortness of breath and cough Denies: sinus congestion, sinus pain, rhinorrhea, ear pain, ear drainage, sore throat and myalgia Treatment to date: Budesonide inhaler Sick contacts: No She does have a hx of mild intermittent asthma.  ROS:  Const: Denies fevers HEENT: As noted in HPI Lungs: +SOB  Past Medical History:  Diagnosis Date  . Arthritis   . Asthma    childhood now returning  . Back pain affecting pregnancy 11/02/2014  . Benign essential tremor 10/28/2014  . Bilateral dry eyes   . Cancer (HCC)    Breast  . Dry eyes   . Essential hypertension, benign 10/28/2014  . H/O measles   . H/O mumps   . History of chicken pox   . Lumbago 11/02/2014  . Mixed hyperlipidemia 10/28/2014  . Obesity 10/28/2014   Family History  Problem Relation Age of Onset  . Heart disease Mother   . Heart attack Mother   . Heart failure Mother   . Hypertension Mother   . Cancer Father     colon  . Asthma Father   . Parkinson's disease Father   . Cancer Maternal Grandmother     uterine  . Heart disease Maternal Grandfather   . Obesity Daughter   . Appendicitis Paternal Grandmother   . Stroke Neg Hx     BP (!) 148/59 (BP Location: Left Arm, Patient Position: Sitting, Cuff Size: Large)   Pulse 62   Temp 97.7 F (36.5 C) (Oral)   Ht 5\' 1"  (1.549 m)   Wt 246 lb 6.4 oz (111.8 kg)   SpO2 96%   BMI 46.56 kg/m  General: Awake, alert, appears stated age HEENT: AT, [place], ears patent b/l and TM's neg, nares patent w/o discharge, pharynx pink and without exudates, MMM Neck: No masses or asymmetry Heart: RRR, no murmurs, no bruits Lungs: CTAB, no accessory muscle use Psych: Age appropriate judgment and insight, normal mood and affect  Acute bronchitis, unspecified  organism - Plan: promethazine-codeine (PHENERGAN WITH CODEINE) 6.25-10 MG/5ML syrup, DISCONTINUED: benzonatate (TESSALON) 100 MG capsule  Orders as above. Instructed pt to only use syrup at night to help with cough and sleep. Pt states she has Best boy at home. OK to use albuterol as needed for SOB. Continue to push fluids, practice good hand hygiene, cover mouth when coughing. F/u prn. If starting to experience fevers, shaking, or worsening shortness of breath/SOB doesn't improve on albuterol, seek immediate care. Pt voiced understanding and agreement to the plan.  [place], DO 04/04/16 10:41 AM

## 2016-04-04 NOTE — Patient Instructions (Addendum)
Continue to push fluids, practice good hand hygiene, and cover your mouth if you cough.  If you start having fevers, shaking or shortness of breath, seek immediate care.  Albuterol- consider using every 4-6 hours as needed for shortness of breath.  Only use the cough syrup at night.

## 2016-04-07 ENCOUNTER — Other Ambulatory Visit: Payer: Self-pay | Admitting: Family Medicine

## 2016-04-07 DIAGNOSIS — J209 Acute bronchitis, unspecified: Secondary | ICD-10-CM

## 2016-04-07 NOTE — Progress Notes (Signed)
Subjective:   Amanda Copeland was seen in consultation in the movement disorder clinic at the request of [name], MD.  The evaluation is for tremor.  This patient is accompanied in the office by her spouse who supplements the history.   The patient is a 76 y.o. right handed female with a history of tremor.  The patient reports that she has had tremor in both hands for the last several years.  She went to a neurologist in [place] and was diagnosed with ET and was put on propranolol, 20 mg bid and it helped a lot without side effects.  There is a family hx of tremor in her father but he has PD.  There is no other family hx of tremor    Affected by caffeine:  No. (coffee is only decaf) Affected by alcohol:  unknown Affected by stress:  No. Affected by fatigue:  No. Spills soup if on spoon:  No. (not now that she is on the propranolol) Spills glass of liquid if full:  No. Affects ADL's (tying shoes, brushing teeth, etc):  No.  Current/Previously tried tremor medications: propranolol  Current medications that may exacerbate tremor:  Albuterol (doesn't notice that it affects tremor); symbicort  Outside reports reviewed: historical medical records.  10/09/15 update:  Pt returns for f/u re: ET.  The records that were made available to me were reviewed.   Reports that she is doing well on propranolol 20 mg bid.  Rarely she will forget the night pill and "i can tell in the AM."  No SE with medication.  She is under tons of stress.  Her son in law was dx with a bone cancer and he is getting aggressive chemo and needs an amputation.  Her mother in law is dying in [place].  She is helping to take care of the grandchildren.    04/11/16 update:  Patient returns today for follow-up.  She is on propranolol, 20 mg twice per day.  overall, she states that her tremor has been well controlled.  she denies falls.  denies lightheadedness or near syncope.  No new medical problems.  She states that she can tell if  she skips medication.    Allergies  Allergen Reactions  . Amoxicillin Rash  . Ampicillin Rash  . Penicillins Rash  . Statins Other (See Comments)    Per reports muscle aches and joint pain    Outpatient Encounter Prescriptions as of 04/11/2016  Medication Sig  . allopurinol (ZYLOPRIM) 100 MG tablet Take 1 tablet (100 mg total) by mouth 2 (two) times daily.  Marland Kitchen aspirin 81 MG tablet Take 81 mg by mouth daily.  . ciclesonide (OMNARIS) 50 MCG/ACT nasal spray Place 2 sprays into both nostrils daily.  Marland Kitchen diltiazem (TIAZAC) 180 MG 24 hr capsule Take 1 capsule (180 mg total) by mouth daily.  . montelukast (SINGULAIR) 10 MG tablet Take 1 tablet (10 mg total) by mouth at bedtime as needed.  . Multiple Vitamin (MULTIVITAMIN) tablet Take 1 tablet by mouth daily.  Marland Kitchen PREVACID 30 MG capsule TAKE ONE CAPSULE BY MOUTH TWICE A DAY  . PROAIR HFA 108 (90 Base) MCG/ACT inhaler INHALE 2 PUFFS INTO THE LUNGS EVERY 6 (SIX) HOURS AS NEEDED FOR WHEEZING OR SHORTNESS OF BREATH.  . promethazine-codeine (PHENERGAN WITH CODEINE) 6.25-10 MG/5ML syrup Take 5 mLs by mouth at bedtime.  . propranolol (INDERAL) 20 MG tablet Take 1 tablet (20 mg total) by mouth 2 (two) times daily.  Marland Kitchen PULMICORT FLEXHALER 90  MCG/ACT inhaler INHALE 2 PUFFS INTO THE LUNGS 2 (TWO) TIMES DAILY.  . Red Yeast Rice Extract (RED YEAST RICE PO) Take by mouth daily.  Marland Kitchen triamterene-hydrochlorothiazide (DYAZIDE) 37.5-25 MG capsule Take 1 each (1 capsule total) by mouth daily.  . [DISCONTINUED] hydrOXYzine (ATARAX/VISTARIL) 25 MG tablet 1tab po q hs as needed itching   Facility-Administered Encounter Medications as of 04/11/2016  Medication  . albuterol (PROVENTIL) (2.5 MG/3ML) 0.083% nebulizer solution 2.5 mg  . ipratropium (ATROVENT) nebulizer solution 0.5 mg    Past Medical History:  Diagnosis Date  . Arthritis   . Asthma    childhood now returning  . Back pain affecting pregnancy 11/02/2014  . Benign essential tremor 10/28/2014  . Bilateral dry  eyes   . Cancer (HCC)    Breast  . Dry eyes   . Essential hypertension, benign 10/28/2014  . H/O measles   . H/O mumps   . History of chicken pox   . Lumbago 11/02/2014  . Mixed hyperlipidemia 10/28/2014  . Obesity 10/28/2014    Past Surgical History:  Procedure Laterality Date  . BACK SURGERY    . BREAST REDUCTION SURGERY Left   . CHOLECYSTECTOMY    . EYE SURGERY  2008   b/l cataracts removed, in [place]  . MASTECTOMY Right   . REPLACEMENT TOTAL KNEE BILATERAL Bilateral   . ROTATOR CUFF REPAIR Left     Social History   Social History  . Marital status: Married    Spouse name: N/A  . Number of children: N/A  . Years of education: N/A   Occupational History  . 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 Smoking status: Never Smoker  . Smokeless tobacco: Never Used  . Alcohol use 0.0 oz/week     Comment: two times a month  . Drug use: No  . Sexual activity: Not on file     Comment: lives with husband, moved [place], no dietary restrictions, retired Education officer, museum   Other Topics Concern  . Not on file   Social History Narrative  . No narrative on file    Family Status  Relation Status  . Mother Deceased   heart disease  . Father Deceased   colon cancer, Parkinson's Disease  . Maternal Grandmother Deceased  . Maternal Grandfather Deceased  . Daughter Alive  . Paternal Grandmother Deceased  . Paternal Grandfather Deceased  . Neg Hx     Review of Systems SOB with asthma.  A complete 10 system ROS was obtained and was negative apart from what is mentioned.   Objective:   VITALS:   Vitals:   04/11/16 0828  BP: 100/60  Pulse: 64  Weight: 243 lb (110.2 kg)  Height: 5\' 1"  (1.549 m)   Gen:  Appears stated age and in NAD. HEENT:  Normocephalic, atraumatic. The mucous membranes are moist. The superficial temporal arteries are without ropiness or tenderness. Cardiovascular: Regular rate and rhythm. Lungs: Clear to auscultation  bilaterally. Neck: There are no carotid bruits noted bilaterally.  NEUROLOGICAL:  Orientation:  The patient is alert and oriented x 3.   Cranial nerves: There is good facial symmetry. Extraocular muscles are intact and visual fields are full to confrontational testing. Speech is fluent and clear. Soft palate rises symmetrically and there is no tongue deviation. Hearing is intact to conversational tone. Tone: Tone is good throughout. Sensation: Sensation is intact to light touch throughout Coordination:  The patient has no dysdiadichokinesia  but has some trouble with FNF on the right with eyes closed but not with eyes open Motor: Strength is 5/5 in the bilateral upper and lower extremities.  Shoulder shrug is equal bilaterally.  There is no pronator drift.  There are no fasciculations noted. Gait and Station: The patient is able to ambulate without difficulty. The patient is mildly wide-based.  She has difficulty ambulating in a tandem fashion.  She is able to stand in the Romberg position.  MOVEMENT EXAM: Tremor:  There is no tremor today.  Spills minimal water when asked to pour a full glass from one glass to another     Assessment/Plan:   1.  Essential Tremor.  -This is evidenced by the symmetrical nature and longstanding hx of gradually getting worse.  She is well controlled on propranolol, 20 mg twice a day.  She has no side effects with the medication.   She wants to continue the medication  2.  Situational depression  -discussed stress relief mechanisms.   3.  F/u 6-8 months.

## 2016-04-11 ENCOUNTER — Ambulatory Visit (INDEPENDENT_AMBULATORY_CARE_PROVIDER_SITE_OTHER): Payer: Medicare Other | Admitting: Neurology

## 2016-04-11 ENCOUNTER — Encounter: Payer: Self-pay | Admitting: Neurology

## 2016-04-11 VITALS — BP 100/60 | HR 64 | Ht 61.0 in | Wt 243.0 lb

## 2016-04-11 DIAGNOSIS — G250 Essential tremor: Secondary | ICD-10-CM

## 2016-04-11 MED ORDER — PROPRANOLOL HCL 20 MG PO TABS: 20.0000 mg | ORAL_TABLET | Freq: Two times a day (BID) | ORAL | 1 refills | Status: DC

## 2016-04-12 ENCOUNTER — Other Ambulatory Visit: Payer: Self-pay | Admitting: Neurology

## 2016-05-09 ENCOUNTER — Encounter: Payer: Self-pay | Admitting: Family Medicine

## 2016-05-19 ENCOUNTER — Encounter: Payer: Self-pay | Admitting: Family Medicine

## 2016-05-19 ENCOUNTER — Ambulatory Visit (INDEPENDENT_AMBULATORY_CARE_PROVIDER_SITE_OTHER): Payer: Medicare Other | Admitting: Family Medicine

## 2016-05-19 ENCOUNTER — Ambulatory Visit (HOSPITAL_BASED_OUTPATIENT_CLINIC_OR_DEPARTMENT_OTHER)
Admission: RE | Admit: 2016-05-19 | Discharge: 2016-05-19 | Disposition: A | Discharge status: Home / Self Care | Payer: Medicare Other | Source: Ambulatory Visit | Attending: Family Medicine | Admitting: Family Medicine

## 2016-05-19 VITALS — BP 117/83 | HR 69 | Temp 98.6°F | Wt 241.0 lb

## 2016-05-19 DIAGNOSIS — I10 Essential (primary) hypertension: Secondary | ICD-10-CM | POA: Diagnosis not present

## 2016-05-19 DIAGNOSIS — Z0000 Encounter for general adult medical examination without abnormal findings: Secondary | ICD-10-CM

## 2016-05-19 DIAGNOSIS — Z9889 Other specified postprocedural states: Secondary | ICD-10-CM | POA: Diagnosis not present

## 2016-05-19 DIAGNOSIS — I700 Atherosclerosis of aorta: Secondary | ICD-10-CM | POA: Diagnosis not present

## 2016-05-19 DIAGNOSIS — R739 Hyperglycemia, unspecified: Secondary | ICD-10-CM | POA: Diagnosis not present

## 2016-05-19 DIAGNOSIS — R059 Cough, unspecified: Secondary | ICD-10-CM

## 2016-05-19 DIAGNOSIS — E782 Mixed hyperlipidemia: Secondary | ICD-10-CM

## 2016-05-19 DIAGNOSIS — J4541 Moderate persistent asthma with (acute) exacerbation: Secondary | ICD-10-CM | POA: Diagnosis not present

## 2016-05-19 DIAGNOSIS — J209 Acute bronchitis, unspecified: Secondary | ICD-10-CM | POA: Diagnosis not present

## 2016-05-19 DIAGNOSIS — M47814 Spondylosis without myelopathy or radiculopathy, thoracic region: Secondary | ICD-10-CM | POA: Diagnosis not present

## 2016-05-19 DIAGNOSIS — R05 Cough: Secondary | ICD-10-CM | POA: Diagnosis not present

## 2016-05-19 DIAGNOSIS — E669 Obesity, unspecified: Secondary | ICD-10-CM | POA: Diagnosis not present

## 2016-05-19 DIAGNOSIS — R0609 Other forms of dyspnea: Secondary | ICD-10-CM

## 2016-05-19 DIAGNOSIS — C801 Malignant (primary) neoplasm, unspecified: Secondary | ICD-10-CM | POA: Diagnosis not present

## 2016-05-19 DIAGNOSIS — M109 Gout, unspecified: Secondary | ICD-10-CM

## 2016-05-19 LAB — HEMOGLOBIN A1C: Hgb A1c MFr Bld: 5.8 % (ref 4.6–6.5)

## 2016-05-19 LAB — COMPREHENSIVE METABOLIC PANEL
ALBUMIN: 4 g/dL (ref 3.5–5.2)
ALK PHOS: 82 U/L (ref 39–117)
ALT: 14 U/L (ref 0–35)
AST: 19 U/L (ref 0–37)
BUN: 18 mg/dL (ref 6–23)
CALCIUM: 9.6 mg/dL (ref 8.4–10.5)
CO2: 27 mEq/L (ref 19–32)
Chloride: 100 mEq/L (ref 96–112)
Creatinine, Ser: 0.86 mg/dL (ref 0.40–1.20)
GFR: 68.11 mL/min (ref >60.00)
GLUCOSE: 93 mg/dL (ref 70–99)
POTASSIUM: 4 meq/L (ref 3.5–5.1)
Sodium: 137 mEq/L (ref 135–145)
TOTAL PROTEIN: 7.1 g/dL (ref 6.0–8.3)
Total Bilirubin: 0.4 mg/dL (ref 0.2–1.2)

## 2016-05-19 LAB — CBC
HEMATOCRIT: 43.1 % (ref 36.0–46.0)
Hemoglobin: 14 g/dL (ref 12.0–15.0)
MCHC: 32.5 g/dL (ref 30.0–36.0)
MCV: 93.7 fl (ref 78.0–100.0)
Platelets: 326 10*3/uL (ref 150.0–400.0)
RBC: 4.6 Mil/uL (ref 3.87–5.11)
RDW: 14.5 % (ref 11.5–15.5)
WBC: 8.1 10*3/uL (ref 4.0–10.5)

## 2016-05-19 LAB — LIPID PANEL
Cholesterol: 222 mg/dL — ABNORMAL HIGH (ref 0–200)
HDL: 68 mg/dL (ref >39.00)
LDL Cholesterol: 131 mg/dL — ABNORMAL HIGH (ref 0–99)
NONHDL: 153.53
Total CHOL/HDL Ratio: 3
Triglycerides: 112 mg/dL (ref 0.0–149.0)
VLDL: 22.4 mg/dL (ref 0.0–40.0)

## 2016-05-19 LAB — TSH: TSH: 2.73 u[IU]/mL (ref 0.35–4.50)

## 2016-05-19 LAB — URIC ACID: URIC ACID, SERUM: 6.5 mg/dL (ref 2.4–7.0)

## 2016-05-19 MED ORDER — ALLOPURINOL 100 MG PO TABS: 100.0000 mg | ORAL_TABLET | Freq: Every day | ORAL | 3 refills | Status: DC

## 2016-05-19 MED ORDER — METHYLPREDNISOLONE 4 MG PO TABS: ORAL_TABLET | ORAL | 0 refills | Status: DC

## 2016-05-19 MED ORDER — LEVOFLOXACIN 250 MG PO TABS: 250.0000 mg | ORAL_TABLET | Freq: Every day | ORAL | 0 refills | Status: DC

## 2016-05-19 MED ORDER — PROMETHAZINE-CODEINE 6.25-10 MG/5ML PO SYRP: 5.0000 mL | ORAL_SOLUTION | Freq: Every evening | ORAL | 0 refills | Status: DC | PRN

## 2016-05-19 NOTE — Progress Notes (Signed)
Pre visit review using our clinic review tool, if applicable. No additional management support is needed unless otherwise documented below in the visit note. 

## 2016-05-19 NOTE — Assessment & Plan Note (Signed)
Well controlled, no changes to meds. Encouraged heart healthy diet such as the DASH diet and exercise as tolerated.  °

## 2016-05-19 NOTE — Assessment & Plan Note (Signed)
Encouraged heart healthy diet, increase exercise, avoid trans fats, consider a krill oil cap daily 

## 2016-05-19 NOTE — Assessment & Plan Note (Signed)
Doing well breast cancer dianosis dates back to 1985 agrees to qoyr mg no concerns

## 2016-05-19 NOTE — Assessment & Plan Note (Signed)
Encouraged DASH diet, decrease po intake and increase exercise as tolerated. Needs 7-8 hours of sleep nightly. Avoid trans fats, eat small, frequent meals every 4-5 hours with lean proteins, complex carbs and healthy fats. Minimize simple carbs. Bariatric referral recommended

## 2016-05-19 NOTE — Assessment & Plan Note (Signed)
Check uric acid today 

## 2016-05-19 NOTE — Progress Notes (Signed)
Patient ID: Amanda Copeland, female   DOB: 06-03-1940, 77 y.o.   MRN: [id number]   Subjective:  I acted as a Education administrator for [name], [place], [place]   Patient ID: Amanda Copeland, female    DOB: [birth year], 76 y.o.   MRN: [id number]  Chief Complaint  Patient presents with  . Annual Exam  . Hyperlipidemia  . Hypertension    Hyperlipidemia  This is a chronic problem. The problem is controlled. Associated symptoms include shortness of breath. Pertinent negatives include no chest pain.  Hypertension  This is a chronic problem. The problem is controlled. Associated symptoms include shortness of breath. Pertinent negatives include no blurred vision, chest pain, headaches, malaise/fatigue or palpitations.    Patient is in today for an annual examination. Patient states tht she still has a non-productive cough from last Office Visit. States that she can feel the mucus rattling around in her chest, but can't seem to get the mucus to come up. Also, states that she is experiencing SOB with exertion. Patient has a Hx of HTN, hyperlipidemia, obesity. Patient has no additional acute concerns noted at this time. She has denies fevers or chills. No polyuria or polydipsia. Denies CP/palp/HA/fevers/GI or GU c/o. Taking meds as prescribed, is doing well with ADLs at home.  Patient Care Team: [name], MD as PCP - General (Family Medicine) Druscilla Brownie, MD as Consulting Physician (Dermatology) Jettie Booze, MD as Consulting Physician (Cardiology) Ludwig Clarks, DO as Consulting Physician (Neurology) Calvert Cantor, MD as Consulting Physician (Ophthalmology)   Past Medical History:  Diagnosis Date  . Arthritis   . Asthma    childhood now returning  . Back pain affecting pregnancy 11/02/2014  . Benign essential tremor 10/28/2014  . Bilateral dry eyes   . Cancer (HCC)    Breast  . Dry eyes   . Encounter for Medicare annual wellness exam 05/22/2016  . Essential hypertension, benign 10/28/2014    . H/O measles   . H/O mumps   . History of chicken pox   . Lumbago 11/02/2014  . Mixed hyperlipidemia 10/28/2014  . Obesity 10/28/2014    Past Surgical History:  Procedure Laterality Date  . BACK SURGERY    . BREAST REDUCTION SURGERY Left   . CHOLECYSTECTOMY    . EYE SURGERY  2008   b/l cataracts removed, in [place]  . MASTECTOMY Right   . REPLACEMENT TOTAL KNEE BILATERAL Bilateral   . ROTATOR CUFF REPAIR Left     Family History  Problem Relation Age of Onset  . Heart disease Mother   . Heart attack Mother   . Heart failure Mother   . Hypertension Mother   . Cancer Father     colon  . Asthma Father   . Parkinson's disease Father   . Cancer Maternal Grandmother     uterine  . Heart disease Maternal Grandfather   . Obesity Daughter   . Appendicitis Paternal Grandmother   . Stroke Neg Hx     Social History   Social History  . Marital status: Married    Spouse name: N/A  . Number of children: N/A  . Years of education: N/A   Occupational History  . 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 Smoking status: Never Smoker  . Smokeless tobacco: Never Used  . Alcohol use 0.0 oz/week     Comment: two times a month  . Drug use: No  .  Sexual activity: Not on file     Comment: lives with husband, moved [place], no dietary restrictions, retired Education officer, [employer]   Other Topics Concern  . Not on file   Social History Narrative  . No narrative on file    Outpatient Medications Prior to Visit  Medication Sig Dispense Refill  . aspirin 81 MG tablet Take 81 mg by mouth daily.    . ciclesonide (OMNARIS) 50 MCG/ACT nasal spray Place 2 sprays into both nostrils daily. 12.5 g 6  . diltiazem (TIAZAC) 180 MG 24 hr capsule Take 1 capsule (180 mg total) by mouth daily. 90 capsule 2  . Multiple Vitamin (MULTIVITAMIN) tablet Take 1 tablet by mouth daily.    Marland Kitchen PREVACID 30 MG capsule TAKE ONE CAPSULE BY MOUTH TWICE A DAY 60 capsule 6  . PROAIR HFA 108 (90  Base) MCG/ACT inhaler INHALE 2 PUFFS INTO THE LUNGS EVERY 6 (SIX) HOURS AS NEEDED FOR WHEEZING OR SHORTNESS OF BREATH. 8.5 Inhaler 0  . propranolol (INDERAL) 20 MG tablet TAKE 1 TABLET (20 MG TOTAL) BY MOUTH 2 (TWO) TIMES DAILY. 180 tablet 1  . Red Yeast Rice Extract (RED YEAST RICE PO) Take by mouth daily.    Marland Kitchen triamterene-hydrochlorothiazide (DYAZIDE) 37.5-25 MG capsule Take 1 each (1 capsule total) by mouth daily. 90 capsule 2  . allopurinol (ZYLOPRIM) 100 MG tablet Take 1 tablet (100 mg total) by mouth 2 (two) times daily. 60 tablet 3  . promethazine-codeine (PHENERGAN WITH CODEINE) 6.25-10 MG/5ML syrup Take 5 mLs by mouth at bedtime. 120 mL 0  . PULMICORT FLEXHALER 90 MCG/ACT inhaler INHALE 2 PUFFS INTO THE LUNGS 2 (TWO) TIMES DAILY. 1 each 1  . montelukast (SINGULAIR) 10 MG tablet Take 1 tablet (10 mg total) by mouth at bedtime as needed. (Patient not taking: Reported on 05/19/2016) 30 tablet 3  . propranolol (INDERAL) 20 MG tablet Take 1 tablet (20 mg total) by mouth 2 (two) times daily. (Patient not taking: Reported on 05/19/2016) 180 tablet 1  . albuterol (PROVENTIL) (2.5 MG/3ML) 0.083% nebulizer solution 2.5 mg     . ipratropium (ATROVENT) nebulizer solution 0.5 mg      No facility-administered medications prior to visit.     Allergies  Allergen Reactions  . Amoxicillin Rash  . Ampicillin Rash  . Penicillins Rash  . Statins Other (See Comments)    Per reports muscle aches and joint pain    Review of Systems  Constitutional: Negative for fever and malaise/fatigue.  HENT: Negative for congestion.   Eyes: Negative for blurred vision.  Respiratory: Positive for cough and shortness of breath.        Non-productive cough. States that she can feel the mucus rattling around in her chest, but can't seem to get the mucus to come up. Also, states that she is experiencing SOB with exertion.  Cardiovascular: Negative for chest pain, palpitations and leg swelling.  Gastrointestinal: Negative  for vomiting.  Musculoskeletal: Negative for back pain.  Skin: Negative for rash.  Neurological: Negative for loss of consciousness and headaches.       Objective:    Physical Exam  Constitutional: She is oriented to person, place, and time. She appears well-developed and well-nourished. No distress.  HENT:  Head: Normocephalic and atraumatic.  Eyes: Conjunctivae are normal.  Neck: Normal range of motion. No thyromegaly present.  Cardiovascular: Normal rate and regular rhythm.   Pulmonary/Chest: Effort normal. She has wheezes.  Wheezing at b/l bases, expiratory.  Abdominal: Soft. Bowel sounds are  normal. There is no tenderness.  Musculoskeletal: She exhibits no edema or deformity.  Neurological: She is alert and oriented to person, place, and time.  Skin: Skin is warm and dry. She is not diaphoretic.  Psychiatric: She has a normal mood and affect.    BP 117/83 (BP Location: Left Wrist, Patient Position: Sitting, Cuff Size: Normal)   Pulse 69   Temp 98.6 F (37 C) (Oral)   Wt 241 lb (109.3 kg)   SpO2 98% Comment: RA  BMI 45.54 kg/m  Wt Readings from Last 3 Encounters:  05/19/16 241 lb (109.3 kg)  04/11/16 243 lb (110.2 kg)  04/04/16 246 lb 6.4 oz (111.8 kg)   BP Readings from Last 3 Encounters:  05/19/16 117/83  04/11/16 100/60  04/04/16 (!) 148/59     Immunization History  Administered Date(s) Administered  . Influenza, High Dose Seasonal PF 11/09/2015  . Influenza-Unspecified 09/07/2012, 10/09/2014  . Pneumococcal Conjugate-13 11/09/2015  . Td 11/09/2015  . Zoster 02/08/2012    Health Maintenance  Topic Date Due  . INFLUENZA VACCINE  09/07/2016  . PNA vac Low Risk Adult (2 of 2 - PPSV23) 11/08/2016  . COLONOSCOPY  11/07/2017  . TETANUS/TDAP  11/08/2025  . DEXA SCAN  Completed    Lab Results  Component Value Date   WBC 8.1 05/19/2016   HGB 14.0 05/19/2016   HCT 43.1 05/19/2016   PLT 326.0 05/19/2016   GLUCOSE 93 05/19/2016   CHOL 222 (H) 05/19/2016     TRIG 112.0 05/19/2016   HDL 68.00 05/19/2016   LDLCALC 131 (H) 05/19/2016   ALT 14 05/19/2016   AST 19 05/19/2016   NA 137 05/19/2016   K 4.0 05/19/2016   CL 100 05/19/2016   CREATININE 0.86 05/19/2016   BUN 18 05/19/2016   CO2 27 05/19/2016   TSH 2.73 05/19/2016   HGBA1C 5.8 05/19/2016    Lab Results  Component Value Date   TSH 2.73 05/19/2016   Lab Results  Component Value Date   WBC 8.1 05/19/2016   HGB 14.0 05/19/2016   HCT 43.1 05/19/2016   MCV 93.7 05/19/2016   PLT 326.0 05/19/2016   Lab Results  Component Value Date   NA 137 05/19/2016   K 4.0 05/19/2016   CO2 27 05/19/2016   GLUCOSE 93 05/19/2016   BUN 18 05/19/2016   CREATININE 0.86 05/19/2016   BILITOT 0.4 05/19/2016   ALKPHOS 82 05/19/2016   AST 19 05/19/2016   ALT 14 05/19/2016   PROT 7.1 05/19/2016   ALBUMIN 4.0 05/19/2016   CALCIUM 9.6 05/19/2016   GFR 68.11 05/19/2016   Lab Results  Component Value Date   CHOL 222 (H) 05/19/2016   Lab Results  Component Value Date   HDL 68.00 05/19/2016   Lab Results  Component Value Date   LDLCALC 131 (H) 05/19/2016   Lab Results  Component Value Date   TRIG 112.0 05/19/2016   Lab Results  Component Value Date   CHOLHDL 3 05/19/2016   Lab Results  Component Value Date   HGBA1C 5.8 05/19/2016         Assessment & Plan:   Problem List Items Addressed This Visit    Asthma   Relevant Medications   methylPREDNISolone (MEDROL) 4 MG tablet   Cancer ([place])    Doing well breast cancer dianosis dates back to 1985 agrees to qoyr mg no concerns      Relevant Medications   levofloxacin (LEVAQUIN) 250 MG tablet   allopurinol (  ZYLOPRIM) 100 MG tablet   methylPREDNISolone (MEDROL) 4 MG tablet   Obesity    Encouraged DASH diet, decrease po intake and increase exercise as tolerated. Needs 7-8 hours of sleep nightly. Avoid trans fats, eat small, frequent meals every 4-5 hours with lean proteins, complex carbs and healthy fats. Minimize simple  carbs. Bariatric referral recommended      Essential hypertension, benign    Well controlled, no changes to meds. Encouraged heart healthy diet such as the DASH diet and exercise as tolerated.       Relevant Orders   CBC (Completed)   TSH (Completed)   Comprehensive metabolic panel (Completed)   Mixed hyperlipidemia    Encouraged heart healthy diet, increase exercise, avoid trans fats, consider a krill oil cap daily      Relevant Orders   Lipid panel (Completed)   Acute bronchitis    Encouraged increased rest and hydration, add probiotics, zinc such as Coldeze or Xicam. Treat fevers as needed. Started on Levaquin and Medrol and given refill of cough syrup      Relevant Medications   promethazine-codeine (PHENERGAN WITH CODEINE) 6.25-10 MG/5ML syrup   Hyperglycemia    hgba1c acceptable, minimize simple carbs. Increase exercise as tolerated.       Relevant Orders   Hemoglobin A1c (Completed)   Gout    Check uric acid.today      Relevant Orders   Uric acid (Completed)   Encounter for Medicare annual wellness exam - Primary    Patient denies any difficulties at home. No trouble with ADLs, depression or falls. See EMR for functional status screen and depression screen. No recent changes to vision or hearing. Is UTD with immunizations. Is UTD with screening. Discussed Advanced Directives. Encouraged heart healthy diet, exercise as tolerated and adequate sleep. See patient's problem list for health risk factors to monitor. See AVS for preventative healthcare recommendation schedule.       Other Visit Diagnoses    Dyspnea on exertion       Relevant Orders   ECHOCARDIOGRAM COMPLETE   Cough       Relevant Orders   DG Chest 2 View (Completed)      I have discontinued Ms. Knobloch montelukast and PULMICORT FLEXHALER. I have also changed her promethazine-codeine and allopurinol. Additionally, I am having her start on levofloxacin and methylPREDNISolone. Lastly, I am having her  maintain her multivitamin, Red Yeast Rice Extract (RED YEAST RICE PO), aspirin, ciclesonide, diltiazem, triamterene-hydrochlorothiazide, PREVACID, PROAIR HFA, and propranolol. We will stop administering albuterol and ipratropium.  Meds ordered this encounter  Medications  . promethazine-codeine (PHENERGAN WITH CODEINE) 6.25-10 MG/5ML syrup    Sig: Take 5 mLs by mouth at bedtime as needed for cough.    Dispense:  120 mL    Refill:  0  . levofloxacin (LEVAQUIN) 250 MG tablet    Sig: Take 1 tablet (250 mg total) by mouth daily.    Dispense:  7 tablet    Refill:  0  . allopurinol (ZYLOPRIM) 100 MG tablet    Sig: Take 1 tablet (100 mg total) by mouth daily.    Dispense:  60 tablet    Refill:  3  . methylPREDNISolone (MEDROL) 4 MG tablet    Sig: 5 tab po qd X 1d then 4 tab po qd X 1d then 3 tab po qd X 1d then 2 tab po qd then 1 tab po qd    Dispense:  15 tablet    Refill:  0  CMA served as Education administrator during this visit. History, Physical and Plan performed by medical provider. Documentation and orders reviewed and attested to.  [name], MD

## 2016-05-19 NOTE — Patient Instructions (Signed)
Preventive Care 77 Years and Older, Female Preventive care refers to lifestyle choices and visits with your health care provider that can promote health and wellness. What does preventive care include?  A yearly physical exam. This is also called an annual well check.  Dental exams once or twice a year.  Routine eye exams. Ask your health care provider how often you should have your eyes checked.  Personal lifestyle choices, including:  Daily care of your teeth and gums.  Regular physical activity.  Eating a healthy diet.  Avoiding tobacco and drug use.  Limiting alcohol use.  Practicing safe sex.  Taking low-dose aspirin every day.  Taking vitamin and mineral supplements as recommended by your health care provider. What happens during an annual well check? The services and screenings done by your health care provider during your annual well check will depend on your age, overall health, lifestyle risk factors, and family history of disease. Counseling  Your health care provider may ask you questions about your:  Alcohol use.  Tobacco use.  Drug use.  Emotional well-being.  Home and relationship well-being.  Sexual activity.  Eating habits.  History of falls.  Memory and ability to understand (cognition).  Work and work environment.  Reproductive health. Screening  You may have the following tests or measurements:  Height, weight, and BMI.  Blood pressure.  Lipid and cholesterol levels. These may be checked every 5 years, or more frequently if you are over 50 years old.  Skin check.  Lung cancer screening. You may have this screening every year starting at age 55 if you have a 30-pack-year history of smoking and currently smoke or have quit within the past 15 years.  Fecal occult blood test (FOBT) of the stool. You may have this test every year starting at age 50.  Flexible sigmoidoscopy or colonoscopy. You may have a sigmoidoscopy every 5 years or  a colonoscopy every 10 years starting at age 50.  Hepatitis C blood test.  Hepatitis B blood test.  Sexually transmitted disease (STD) testing.  Diabetes screening. This is done by checking your blood sugar (glucose) after you have not eaten for a while (fasting). You may have this done every 1-3 years.  Bone density scan. This is done to screen for osteoporosis. You may have this done starting at age 65.  Mammogram. This may be done every 1-2 years. Talk to your health care provider about how often you should have regular mammograms. Talk with your health care provider about your test results, treatment options, and if necessary, the need for more tests. Vaccines  Your health care provider may recommend certain vaccines, such as:  Influenza vaccine. This is recommended every year.  Tetanus, diphtheria, and acellular pertussis (Tdap, Td) vaccine. You may need a Td booster every 10 years.  Varicella vaccine. You may need this if you have not been vaccinated.  Zoster vaccine. You may need this after age 60.  Measles, mumps, and rubella (MMR) vaccine. You may need at least one dose of MMR if you were born in 1957 or later. You may also need a second dose.  Pneumococcal 13-valent conjugate (PCV13) vaccine. One dose is recommended after age 65.  Pneumococcal polysaccharide (PPSV23) vaccine. One dose is recommended after age 65.  Meningococcal vaccine. You may need this if you have certain conditions.  Hepatitis A vaccine. You may need this if you have certain conditions or if you travel or work in places where you may be exposed to   hepatitis A.  Hepatitis B vaccine. You may need this if you have certain conditions or if you travel or work in places where you may be exposed to hepatitis B.  Haemophilus influenzae type b (Hib) vaccine. You may need this if you have certain conditions. Talk to your health care provider about which screenings and vaccines you need and how often you need  them. This information is not intended to replace advice given to you by your health care provider. Make sure you discuss any questions you have with your health care provider. Document Released: 02/20/2015 Document Revised: 10/14/2015 Document Reviewed: 11/25/2014 Elsevier Interactive Patient Education  2017 [employer]  

## 2016-05-22 ENCOUNTER — Encounter: Payer: Self-pay | Admitting: Family Medicine

## 2016-05-22 DIAGNOSIS — Z0000 Encounter for general adult medical examination without abnormal findings: Secondary | ICD-10-CM | POA: Insufficient documentation

## 2016-05-22 HISTORY — DX: Encounter for general adult medical examination without abnormal findings: Z00.00

## 2016-05-22 NOTE — Assessment & Plan Note (Addendum)
Patient denies any difficulties at home. No trouble with ADLs, depression or falls. See EMR for functional status screen and depression screen. No recent changes to vision or hearing. Is UTD with immunizations. Is UTD with screening. Discussed Advanced Directives. Encouraged heart healthy diet, exercise as tolerated and adequate sleep. See patient's problem list for health risk factors to monitor. See AVS for preventative healthcare recommendation schedule. 

## 2016-05-22 NOTE — Assessment & Plan Note (Signed)
Encouraged increased rest and hydration, add probiotics, zinc such as Coldeze or Xicam. Treat fevers as needed. Started on Levaquin and Medrol and given refill of cough syrup

## 2016-05-22 NOTE — Assessment & Plan Note (Signed)
hgba1c acceptable, minimize simple carbs. Increase exercise as tolerated.  

## 2016-06-01 ENCOUNTER — Ambulatory Visit (HOSPITAL_BASED_OUTPATIENT_CLINIC_OR_DEPARTMENT_OTHER)
Admission: RE | Admit: 2016-06-01 | Discharge: 2016-06-01 | Disposition: A | Discharge status: Home / Self Care | Payer: Medicare Other | Source: Ambulatory Visit | Attending: Family Medicine | Admitting: Family Medicine

## 2016-06-01 DIAGNOSIS — I340 Nonrheumatic mitral (valve) insufficiency: Secondary | ICD-10-CM | POA: Insufficient documentation

## 2016-06-01 DIAGNOSIS — E785 Hyperlipidemia, unspecified: Secondary | ICD-10-CM | POA: Diagnosis not present

## 2016-06-01 DIAGNOSIS — I10 Essential (primary) hypertension: Secondary | ICD-10-CM | POA: Insufficient documentation

## 2016-06-01 DIAGNOSIS — R0609 Other forms of dyspnea: Secondary | ICD-10-CM | POA: Insufficient documentation

## 2016-06-01 DIAGNOSIS — E669 Obesity, unspecified: Secondary | ICD-10-CM | POA: Diagnosis not present

## 2016-06-01 LAB — ECHOCARDIOGRAM COMPLETE
AVLVOTPG: 5 mmHg
CHL CUP DOP CALC LVOT VTI: 31.6 cm
CHL CUP LV S' LATERAL: 5.77 cm/s
CHL CUP MV DEC (S): 250
E decel time: 250 msec
E/e' ratio: 8.74
FS: 29 % (ref 28–44)
IVS/LV PW RATIO, ED: 1.24
LA ID, A-P, ES: 35 mm
LA vol A4C: 35.1 ml
LA vol: 51.7 mL
LADIAMINDEX: 1.72 cm/m2
LAVOLIN: 25.3 mL/m2
LEFT ATRIUM END SYS DIAM: 35 mm
LV TDI E'MEDIAL: 6.2
LVEEAVG: 8.74
LVEEMED: 8.74
LVELAT: 7.51 cm/s
LVOT SV: 64 mL
LVOT area: 2.01 cm2
LVOT diameter: 16 mm
LVOT peak vel: 116 cm/s
MV pk A vel: 81.4 m/s
MVPKEVEL: 65.6 m/s
PW: 10 mm — AB (ref 0.6–1.1)
TAPSE: 27.7 mm
TDI e' lateral: 7.51

## 2016-06-01 NOTE — Progress Notes (Signed)
  Echocardiogram 2D Echocardiogram has been performed.  Bobbye Charleston 06/01/2016, 9:16 AM

## 2016-06-02 ENCOUNTER — Other Ambulatory Visit: Payer: Self-pay | Admitting: Family Medicine

## 2016-06-02 ENCOUNTER — Other Ambulatory Visit (HOSPITAL_BASED_OUTPATIENT_CLINIC_OR_DEPARTMENT_OTHER): Payer: Self-pay

## 2016-06-02 DIAGNOSIS — J4521 Mild intermittent asthma with (acute) exacerbation: Secondary | ICD-10-CM

## 2016-06-14 DIAGNOSIS — H01001 Unspecified blepharitis right upper eyelid: Secondary | ICD-10-CM | POA: Diagnosis not present

## 2016-06-14 DIAGNOSIS — Z961 Presence of intraocular lens: Secondary | ICD-10-CM | POA: Diagnosis not present

## 2016-06-14 DIAGNOSIS — H04123 Dry eye syndrome of bilateral lacrimal glands: Secondary | ICD-10-CM | POA: Diagnosis not present

## 2016-06-14 DIAGNOSIS — H524 Presbyopia: Secondary | ICD-10-CM | POA: Diagnosis not present

## 2016-06-14 DIAGNOSIS — H01002 Unspecified blepharitis right lower eyelid: Secondary | ICD-10-CM | POA: Diagnosis not present

## 2016-06-21 DIAGNOSIS — H353112 Nonexudative age-related macular degeneration, right eye, intermediate dry stage: Secondary | ICD-10-CM | POA: Diagnosis not present

## 2016-06-21 DIAGNOSIS — H43811 Vitreous degeneration, right eye: Secondary | ICD-10-CM | POA: Diagnosis not present

## 2016-06-21 DIAGNOSIS — H43812 Vitreous degeneration, left eye: Secondary | ICD-10-CM | POA: Diagnosis not present

## 2016-06-21 DIAGNOSIS — H353121 Nonexudative age-related macular degeneration, left eye, early dry stage: Secondary | ICD-10-CM | POA: Diagnosis not present

## 2016-06-21 DIAGNOSIS — H35451 Secondary pigmentary degeneration, right eye: Secondary | ICD-10-CM | POA: Diagnosis not present

## 2016-06-21 DIAGNOSIS — H35721 Serous detachment of retinal pigment epithelium, right eye: Secondary | ICD-10-CM | POA: Diagnosis not present

## 2016-06-21 DIAGNOSIS — H35363 Drusen (degenerative) of macula, bilateral: Secondary | ICD-10-CM | POA: Diagnosis not present

## 2016-06-21 DIAGNOSIS — H04123 Dry eye syndrome of bilateral lacrimal glands: Secondary | ICD-10-CM | POA: Diagnosis not present

## 2016-06-21 DIAGNOSIS — H35452 Secondary pigmentary degeneration, left eye: Secondary | ICD-10-CM | POA: Diagnosis not present

## 2016-07-11 ENCOUNTER — Other Ambulatory Visit: Payer: Self-pay | Admitting: Family Medicine

## 2016-07-12 DIAGNOSIS — H35451 Secondary pigmentary degeneration, right eye: Secondary | ICD-10-CM | POA: Diagnosis not present

## 2016-07-12 DIAGNOSIS — H43812 Vitreous degeneration, left eye: Secondary | ICD-10-CM | POA: Diagnosis not present

## 2016-07-12 DIAGNOSIS — H353112 Nonexudative age-related macular degeneration, right eye, intermediate dry stage: Secondary | ICD-10-CM | POA: Diagnosis not present

## 2016-07-28 ENCOUNTER — Ambulatory Visit (HOSPITAL_BASED_OUTPATIENT_CLINIC_OR_DEPARTMENT_OTHER)
Admission: RE | Admit: 2016-07-28 | Discharge: 2016-07-28 | Disposition: A | Discharge status: Home / Self Care | Payer: Medicare Other | Source: Ambulatory Visit | Attending: Family Medicine | Admitting: Family Medicine

## 2016-07-28 ENCOUNTER — Ambulatory Visit (INDEPENDENT_AMBULATORY_CARE_PROVIDER_SITE_OTHER): Payer: Medicare Other | Admitting: Family Medicine

## 2016-07-28 DIAGNOSIS — M545 Low back pain: Secondary | ICD-10-CM | POA: Diagnosis not present

## 2016-07-28 DIAGNOSIS — I10 Essential (primary) hypertension: Secondary | ICD-10-CM

## 2016-07-28 DIAGNOSIS — E669 Obesity, unspecified: Secondary | ICD-10-CM

## 2016-07-28 DIAGNOSIS — M1288 Other specific arthropathies, not elsewhere classified, other specified site: Secondary | ICD-10-CM | POA: Insufficient documentation

## 2016-07-28 DIAGNOSIS — M4305 Spondylolysis, thoracolumbar region: Secondary | ICD-10-CM | POA: Insufficient documentation

## 2016-07-28 MED ORDER — TIZANIDINE HCL 2 MG PO CAPS: 2.0000 mg | ORAL_CAPSULE | Freq: Every evening | ORAL | 2 refills | Status: DC | PRN

## 2016-07-28 NOTE — Patient Instructions (Addendum)
Moist heat and then apply lidocaine gel three x a day Tylenol 650 mg tabs three times a day  Back Pain, Adult Back pain is very common. The pain often gets better over time. The cause of back pain is usually not dangerous. Most people can learn to manage their back pain on their own. Follow these instructions at home: Watch your back pain for any changes. The following actions may help to lessen any pain you are feeling:  Stay active. Start with short walks on flat ground if you can. Try to walk farther each day.  Exercise regularly as told by your doctor. Exercise helps your back heal faster. It also helps avoid future injury by keeping your muscles strong and flexible.  Do not sit, drive, or stand in one place for more than 30 minutes.  Do not stay in bed. Resting more than 1-2 days can slow down your recovery.  Be careful when you bend or lift an object. Use good form when lifting: ? Bend at your knees. ? Keep the object close to your body. ? Do not twist.  Sleep on a firm mattress. Lie on your side, and bend your knees. If you lie on your back, put a pillow under your knees.  Take medicines only as told by your doctor.  Put ice on the injured area. ? Put ice in a plastic bag. ? Place a towel between your skin and the bag. ? Leave the ice on for 20 minutes, 2-3 times a day for the first 2-3 days. After that, you can switch between ice and heat packs.  Avoid feeling anxious or stressed. Find good ways to deal with stress, such as exercise.  Maintain a healthy weight. Extra weight puts stress on your back.  Contact a doctor if:  You have pain that does not go away with rest or medicine.  You have worsening pain that goes down into your legs or buttocks.  You have pain that does not get better in one week.  You have pain at night.  You lose weight.  You have a fever or chills. Get help right away if:  You cannot control when you poop (bowel movement) or pee  (urinate).  Your arms or legs feel weak.  Your arms or legs lose feeling (numbness).  You feel sick to your stomach (nauseous) or throw up (vomit).  You have belly (abdominal) pain.  You feel like you may pass out (faint). This information is not intended to replace advice given to you by your health care provider. Make sure you discuss any questions you have with your health care provider. Document Released: 07/13/2007 Document Revised: 07/02/2015 Document Reviewed: 05/28/2013 Elsevier Interactive Patient Education  [employer].

## 2016-07-28 NOTE — Assessment & Plan Note (Signed)
Well controlled, no changes to meds. Encouraged heart healthy diet such as the DASH diet and exercise as tolerated.  °

## 2016-07-28 NOTE — Progress Notes (Signed)
Subjective:  I acted as a Education administrator for Dr. Charlett Blake. [name], [place]  Patient ID: Amanda Copeland, female    DOB: 12/02/1940, 76 y.o.   MRN: [id number]  No chief complaint on file.   HPI  Patient is in today for an acute visit for low back pain. Patient states she has struggled with back pain for years however the past few weeks the pain has progressively worse. She also states her right side has started giving her some pain she states she is starting to have difficulty walking. No recent febrile illness or acute hospitalizations. Denies CP/palp/SOB/HA/congestion/fevers/GI or GU c/o. Taking meds as prescribed. No recent fall or injury. They note she has gotten progressively unsteady since her knee surgeries.   Patient Care Team: [name], MD as PCP - General (Family Medicine) Druscilla Brownie, MD as Consulting Physician (Dermatology) Jettie Booze, MD as Consulting Physician (Cardiology) Tat, Eustace Quail, DO as Consulting Physician (Neurology) Calvert Cantor, MD as Consulting Physician (Ophthalmology)   Past Medical History:  Diagnosis Date  . Arthritis   . Asthma    childhood now returning  . Back pain affecting pregnancy 11/02/2014  . Benign essential tremor 10/28/2014  . Bilateral dry eyes   . Cancer (HCC)    Breast  . Dry eyes   . Encounter for Medicare annual wellness exam 05/22/2016  . Essential hypertension, benign 10/28/2014  . H/O measles   . H/O mumps   . History of chicken pox   . Lumbago 11/02/2014  . Mixed hyperlipidemia 10/28/2014  . Obesity 10/28/2014    Past Surgical History:  Procedure Laterality Date  . BACK SURGERY    . BREAST REDUCTION SURGERY Left   . CHOLECYSTECTOMY    . EYE SURGERY  2008   b/l cataracts removed, in [place]  . MASTECTOMY Right   . REPLACEMENT TOTAL KNEE BILATERAL Bilateral   . ROTATOR CUFF REPAIR Left     Family History  Problem Relation Age of Onset  . Heart disease Mother   . Heart attack Mother   . Heart failure Mother     . Hypertension Mother   . Cancer Father        colon  . Asthma Father   . Parkinson's disease Father   . Cancer Maternal Grandmother        uterine  . Heart disease Maternal Grandfather   . Obesity Daughter   . Appendicitis Paternal Grandmother   . Stroke Neg Hx     Social History   Social History  . Marital status: Married    Spouse name: N/A  . Number of children: N/A  . Years of education: N/A   Occupational History  . 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 Smoking status: Never Smoker  . Smokeless tobacco: Never Used  . Alcohol use 0.0 oz/week     Comment: two times a month  . Drug use: No  . Sexual activity: Not on file     Comment: lives with husband, moved [place], no dietary restrictions, retired Education officer, museum   Other Topics Concern  . Not on file   Social History Narrative  . No narrative on file    Outpatient Medications Prior to Visit  Medication Sig Dispense Refill  . allopurinol (ZYLOPRIM) 100 MG tablet Take 1 tablet (100 mg total) by mouth daily. 60 tablet 3  . aspirin 81 MG tablet Take 81 mg by mouth daily.    Marland Kitchen  ciclesonide (OMNARIS) 50 MCG/ACT nasal spray Place 2 sprays into both nostrils daily. 12.5 g 6  . diltiazem (TIAZAC) 180 MG 24 hr capsule Take 1 capsule (180 mg total) by mouth daily. 90 capsule 2  . PREVACID 30 MG capsule TAKE ONE CAPSULE BY MOUTH TWICE A DAY 60 capsule 6  . PROAIR HFA 108 (90 Base) MCG/ACT inhaler INHALE 2 PUFFS INTO THE LUNGS EVERY 6 (SIX) HOURS AS NEEDED FOR WHEEZING OR SHORTNESS OF BREATH. 8.5 Inhaler 0  . propranolol (INDERAL) 20 MG tablet TAKE 1 TABLET (20 MG TOTAL) BY MOUTH 2 (TWO) TIMES DAILY. 180 tablet 1  . Red Yeast Rice Extract (RED YEAST RICE PO) Take by mouth daily.    Marland Kitchen triamterene-hydrochlorothiazide (DYAZIDE) 37.5-25 MG capsule Take 1 each (1 capsule total) by mouth daily. 90 capsule 2  . allopurinol (ZYLOPRIM) 100 MG tablet TAKE 1 TABLET (100 MG TOTAL) BY MOUTH 2 (TWO) TIMES  DAILY. 60 tablet 3  . levofloxacin (LEVAQUIN) 250 MG tablet Take 1 tablet (250 mg total) by mouth daily. 7 tablet 0  . methylPREDNISolone (MEDROL) 4 MG tablet 5 tab po qd X 1d then 4 tab po qd X 1d then 3 tab po qd X 1d then 2 tab po qd then 1 tab po qd 15 tablet 0  . Multiple Vitamin (MULTIVITAMIN) tablet Take 1 tablet by mouth daily.    . promethazine-codeine (PHENERGAN WITH CODEINE) 6.25-10 MG/5ML syrup Take 5 mLs by mouth at bedtime as needed for cough. 120 mL 0   No facility-administered medications prior to visit.     Allergies  Allergen Reactions  . Amoxicillin Rash  . Ampicillin Rash  . Penicillins Rash  . Statins Other (See Comments)    Per reports muscle aches and joint pain    Review of Systems  Constitutional: Negative for fever and malaise/fatigue.  HENT: Negative for congestion.   Eyes: Negative for blurred vision.  Respiratory: Negative for cough and shortness of breath.   Cardiovascular: Negative for chest pain, palpitations and leg swelling.  Gastrointestinal: Negative for vomiting.  Musculoskeletal: Positive for back pain and joint pain.  Skin: Negative for rash.  Neurological: Negative for loss of consciousness and headaches.       Objective:    Physical Exam  Constitutional: She is oriented to person, place, and time. She appears well-developed and well-nourished. No distress.  HENT:  Head: Normocephalic and atraumatic.  Nose: Nose normal.  Eyes: Conjunctivae are normal. Right eye exhibits no discharge. Left eye exhibits no discharge.  Neck: Normal range of motion. Neck supple. No thyromegaly present.  Cardiovascular: Normal rate and regular rhythm.   No murmur heard. Pulmonary/Chest: Effort normal and breath sounds normal. She has no wheezes.  Abdominal: Soft. Bowel sounds are normal. There is no tenderness.  Musculoskeletal: Normal range of motion. She exhibits no edema or deformity.  Pain with palp over right flank with muscle spasm noted and pain  with palp over lower LS spine.   Neurological: She is alert and oriented to person, place, and time.  Skin: Skin is warm and dry. She is not diaphoretic.  Psychiatric: She has a normal mood and affect.  Nursing note and vitals reviewed.   There were no vitals taken for this visit. Wt Readings from Last 3 Encounters:  05/19/16 241 lb (109.3 kg)  04/11/16 243 lb (110.2 kg)  04/04/16 246 lb 6.4 oz (111.8 kg)   BP Readings from Last 3 Encounters:  05/19/16 117/83  04/11/16 100/60  04/04/16 Marland Kitchen)  148/59     Immunization History  Administered Date(s) Administered  . Influenza, High Dose Seasonal PF 11/09/2015  . Influenza-Unspecified 09/07/2012, 10/09/2014  . Pneumococcal Conjugate-13 11/09/2015  . Td 11/09/2015  . Zoster 02/08/2012    Health Maintenance  Topic Date Due  . INFLUENZA VACCINE  09/07/2016  . PNA vac Low Risk Adult (2 of 2 - PPSV23) 11/08/2016  . COLONOSCOPY  11/07/2017  . TETANUS/TDAP  11/08/2025  . DEXA SCAN  Completed    Lab Results  Component Value Date   WBC 8.1 05/19/2016   HGB 14.0 05/19/2016   HCT 43.1 05/19/2016   PLT 326.0 05/19/2016   GLUCOSE 93 05/19/2016   CHOL 222 (H) 05/19/2016   TRIG 112.0 05/19/2016   HDL 68.00 05/19/2016   LDLCALC 131 (H) 05/19/2016   ALT 14 05/19/2016   AST 19 05/19/2016   NA 137 05/19/2016   K 4.0 05/19/2016   CL 100 05/19/2016   CREATININE 0.86 05/19/2016   BUN 18 05/19/2016   CO2 27 05/19/2016   TSH 2.73 05/19/2016   HGBA1C 5.8 05/19/2016    Lab Results  Component Value Date   TSH 2.73 05/19/2016   Lab Results  Component Value Date   WBC 8.1 05/19/2016   HGB 14.0 05/19/2016   HCT 43.1 05/19/2016   MCV 93.7 05/19/2016   PLT 326.0 05/19/2016   Lab Results  Component Value Date   NA 137 05/19/2016   K 4.0 05/19/2016   CO2 27 05/19/2016   GLUCOSE 93 05/19/2016   BUN 18 05/19/2016   CREATININE 0.86 05/19/2016   BILITOT 0.4 05/19/2016   ALKPHOS 82 05/19/2016   AST 19 05/19/2016   ALT 14 05/19/2016    PROT 7.1 05/19/2016   ALBUMIN 4.0 05/19/2016   CALCIUM 9.6 05/19/2016   GFR 68.11 05/19/2016   Lab Results  Component Value Date   CHOL 222 (H) 05/19/2016   Lab Results  Component Value Date   HDL 68.00 05/19/2016   Lab Results  Component Value Date   LDLCALC 131 (H) 05/19/2016   Lab Results  Component Value Date   TRIG 112.0 05/19/2016   Lab Results  Component Value Date   CHOLHDL 3 05/19/2016   Lab Results  Component Value Date   HGBA1C 5.8 05/19/2016         Assessment & Plan:   Problem List Items Addressed This Visit    Obesity    Encouraged DASH diet, decrease po intake and increase exercise as tolerated. Needs 7-8 hours of sleep nightly. Avoid trans fats, eat small, frequent meals every 4-6 hours. Bariatric referral      Essential hypertension, benign    Well controlled, no changes to meds. Encouraged heart healthy diet such as the DASH diet and exercise as tolerated.       Back pain - Primary    Encouraged moist heat and gentle stretching as tolerated. May try NSAIDs and prescription meds as directed and report if symptoms worsen or seek immediate care. Referred to PT and xray ordered. Encouraged Tylenol 650 mg tid, ice and then lidocaine to affect area bid and given Zanaflex to use prn      Relevant Medications   tizanidine (ZANAFLEX) 2 MG capsule   Other Relevant Orders   DG Lumbar Spine Complete (Completed)   Ambulatory referral to Physical Therapy      I have discontinued Ms. Swor multivitamin, promethazine-codeine, levofloxacin, and methylPREDNISolone. I am also having her start on tizanidine. Additionally, I am  having her maintain her Red Yeast Rice Extract (RED YEAST RICE PO), aspirin, ciclesonide, diltiazem, triamterene-hydrochlorothiazide, PREVACID, PROAIR HFA, propranolol, and allopurinol.  Meds ordered this encounter  Medications  . tizanidine (ZANAFLEX) 2 MG capsule    Sig: Take 1 capsule (2 mg total) by mouth at bedtime as needed  for muscle spasms.    Dispense:  30 capsule    Refill:  2    CMA served as scribe during this visit. History, Physical and Plan performed by medical provider. Documentation and orders reviewed and attested to.  [name], MD

## 2016-07-29 NOTE — Assessment & Plan Note (Signed)
Encouraged DASH diet, decrease po intake and increase exercise as tolerated. Needs 7-8 hours of sleep nightly. Avoid trans fats, eat small, frequent meals every 4-6 hours. Bariatric referral

## 2016-07-29 NOTE — Assessment & Plan Note (Signed)
Encouraged moist heat and gentle stretching as tolerated. May try NSAIDs and prescription meds as directed and report if symptoms worsen or seek immediate care. Referred to PT and xray ordered. Encouraged Tylenol 650 mg tid, ice and then lidocaine to affect area bid and given Zanaflex to use prn

## 2016-08-09 ENCOUNTER — Ambulatory Visit: Payer: Medicare Other | Attending: Family Medicine | Admitting: Physical Therapy

## 2016-08-09 ENCOUNTER — Other Ambulatory Visit: Payer: Self-pay | Admitting: Family Medicine

## 2016-08-09 DIAGNOSIS — R2681 Unsteadiness on feet: Secondary | ICD-10-CM | POA: Diagnosis not present

## 2016-08-09 DIAGNOSIS — R262 Difficulty in walking, not elsewhere classified: Secondary | ICD-10-CM | POA: Diagnosis not present

## 2016-08-09 DIAGNOSIS — J4521 Mild intermittent asthma with (acute) exacerbation: Secondary | ICD-10-CM

## 2016-08-09 DIAGNOSIS — M6281 Muscle weakness (generalized): Secondary | ICD-10-CM | POA: Insufficient documentation

## 2016-08-09 DIAGNOSIS — M545 Low back pain: Secondary | ICD-10-CM | POA: Insufficient documentation

## 2016-08-09 DIAGNOSIS — R293 Abnormal posture: Secondary | ICD-10-CM | POA: Diagnosis not present

## 2016-08-09 NOTE — Therapy (Signed)
[facility] [address]  [place], [place], [address] Phone: [phone]   Fax:  [phone]  Physical Therapy Evaluation  Patient Details  Name: Margerie Fraiser MRN: [id number] Date of Birth: 1940-03-31 Referring Provider: Dr. [name]  Encounter Date: 08/09/2016      PT End of Session - 08/09/16 0938    Visit Number 1   Number of Visits 16   Date for PT Re-Evaluation 10/04/16   Authorization Type Medicare   PT Start Time 7371   PT Stop Time 0928   PT Time Calculation (min) 41 min   Activity Tolerance Patient tolerated treatment well   Behavior During Therapy [facility] for tasks assessed/performed      Past Medical History:  Diagnosis Date  . Arthritis   . Asthma    childhood now returning  . Back pain affecting pregnancy 11/02/2014  . Benign essential tremor 10/28/2014  . Bilateral dry eyes   . Cancer (HCC)    Breast  . Dry eyes   . Encounter for Medicare annual wellness exam 05/22/2016  . Essential hypertension, benign 10/28/2014  . H/O measles   . H/O mumps   . History of chicken pox   . Lumbago 11/02/2014  . Mixed hyperlipidemia 10/28/2014  . Obesity 10/28/2014    Past Surgical History:  Procedure Laterality Date  . BACK SURGERY    . BREAST REDUCTION SURGERY Left   . CHOLECYSTECTOMY    . EYE SURGERY  2008   b/l cataracts removed, in [place]  . MASTECTOMY Right   . REPLACEMENT TOTAL KNEE BILATERAL Bilateral   . ROTATOR CUFF REPAIR Left     There were no vitals filed for this visit.       Subjective Assessment - 08/09/16 0848    Subjective Patient reporting "really sore area" in R sided low back - MD thinks it is muscle spasms. Has difficulty reaching and getting out of bed - last couple days pain has been bothering her. Reports a history of low back issues and feels like her "legs just don't want to go" - feels like there is something in low back pushing on something. Denies N&T into legs and  feet. Denies bowel and baldder involvement.    Pertinent History hx of surgery for "ruptured disc", hx of breast cancer   Limitations Walking   How long can you walk comfortably? 5-10 minutes   Diagnostic tests Xray:   Patient Stated Goals improve balance and use of LE   Currently in Pain? No/denies            [facility] PT Assessment - 08/09/16 0853      Assessment   Medical Diagnosis Low back pain   Referring Provider Dr. [name]   Onset Date/Surgical Date --  ~1 year   Next MD Visit --  October 2018   Prior Therapy yes - not for this issue     Precautions   Precautions None     Restrictions   Weight Bearing Restrictions No     Balance Screen   Has the patient fallen in the past 6 months No   Has the patient had a decrease in activity level because of a fear of falling?  No   Is the patient reluctant to leave their home because of a fear of falling?  No     Home Social worker Private residence   Living Arrangements Spouse/significant other   Type  of [place] Access Level entry   [place] - single point  only use in the community for stairs     Prior Function   Level of [place] Retired   Leisure caring for grandchildren     Cognition   Overall Cognitive Status Within Functional Limits for tasks assessed     Observation/Other Assessments   Focus on Therapeutic Outcomes (FOTO)  Lumbar Spine: 44 (56% limited, predicted 42% limited)     Sensation   Light Touch Appears Intact     Coordination   Gross Motor Movements are Fluid and Coordinated Yes     Posture/Postural Control   Posture/Postural Control Postural limitations   Postural Limitations Rounded Shoulders;Forward head;Increased thoracic kyphosis     ROM / Strength   AROM / PROM / Strength AROM;Strength     AROM   AROM Assessment Site Lumbar   Lumbar Flexion fingertips to anterior ankle joint - slow to  return to neutral   Lumbar Extension 25% limited   Lumbar - Right Side Bend fingertip to joint line - pain in R side low back   Lumbar - Left Side Bend fingertip to joint line - "stretching)   Lumbar - Right Rotation 25% limited - painful   Lumbar - Left Rotation WFL - no pain     Strength   Overall Strength Comments  B LE grossly 4-/5 - no pain with all MMT     Flexibility   Soft Tissue Assessment /Muscle Length yes   Hamstrings B mild tightness - "it makes my tailbone hurt"     Palpation   Palpation comment tenderness to both R and L sided mid to low back, midline tenderness form approx mid thoracic spine to lower lumbar spine     Special Tests    Special Tests Lumbar   Lumbar Tests Straight Leg Raise     Straight Leg Raise   Findings Negative   Comment bilateral            Objective measurements completed on examination: See above findings.          [facility] Adult PT Treatment/Exercise - 08/09/16 0853      Exercises   Exercises Lumbar     Lumbar Exercises: Stretches   Passive Hamstring Stretch 3 reps;30 seconds   Passive Hamstring Stretch Limitations B: supine with strap   Single Knee to Chest Stretch 3 reps;20 seconds   Single Knee to Chest Stretch Limitations B: use of towel   Lower Trunk Rotation Limitations 10 x 10 seconds     Manual Therapy   Manual therapy comments education on self massage with  ball to both back and glute musculature.                PT Education - 08/09/16 [phone]    Education provided Yes   Education Details exam findings, POC, HEP   Person(s) Educated Patient   Methods Explanation;Demonstration;Handout   Comprehension Verbalized understanding;Returned demonstration;Need further instruction          PT Short Term Goals - 08/09/16 1136      PT SHORT TERM GOAL #1   Title patient to be independent with initial HEP (09/06/16)   Status New     PT SHORT TERM GOAL #2   Title Patient to demonstrate AROM of L-spine in all  planes WFL and symmetrical without pain limiting (09/06/16)   Status New  PT Long Term Goals - 08/09/16 1137      PT LONG TERM GOAL #1   Title patient to be independent with advanced HEP (10/04/16)   Status New     PT LONG TERM GOAL #2   Title Patient to report ability to ambulate in community over various surfaces without LOB or pain limiting for >/= 20-30 minutes (10/04/16)   Status New     PT LONG TERM GOAL #3   Title Patient to report initiation and maintainence of weekly exercise and walking program (10/04/16)   Status New     PT LONG TERM GOAL #4   Title Patient to demosntrate good posture and body mechanics needed for daily activities with ability ot self correct (10/04/16)   Status New                Plan - 08/09/16 0949    Clinical Impression Statement Patient is a 76 y/o female presenting to [facility] today regarding primary complaints of low back pain as well as feelings of "legs giving out." Patient today with near full AROM of lumbar spine, however, pain ellicited with rotation and side bending. Patient also with noted mild tightness in B hasmstrings as well as tenderness thoughout thoracic and lumbar spine with both the bony and musculature level. Patient to benefit from PT to address the aboce listed deficits and pain to allow for improved functional mobility and general quality of life.    History and Personal Factors relevant to plan of care: hx of spinal surgery, hx of breast cancer   Clinical Presentation Stable   Clinical Presentation due to: no comorbidities affecting POC   Clinical Decision Making Low   Rehab Potential Good   PT Frequency 2x / week   PT Duration 8 weeks   PT Treatment/Interventions ADLs/Self Care Home Management;Cryotherapy;Electrical Stimulation;Moist Heat;Traction;Ultrasound;Neuromuscular re-education;Balance training;Therapeutic exercise;Therapeutic activities;Functional mobility training;Patient/family education;Manual  techniques;Vasopneumatic Device;Dry needling;Taping   PT Next Visit Plan PT: assess balance; otherwise continue with gentle stretching and strengthening.    Consulted and Agree with Plan of Care Patient      Patient will benefit from skilled therapeutic intervention in order to improve the following deficits and impairments:  Abnormal gait, Decreased activity tolerance, Decreased balance, Decreased mobility, Decreased strength, Difficulty walking, Pain  Visit Diagnosis: Bilateral low back pain, unspecified chronicity, with sciatica presence unspecified - Plan: PT plan of care cert/re-cert  Difficulty in walking, not elsewhere classified - Plan: PT plan of care cert/re-cert  Abnormal posture - Plan: PT plan of care cert/re-cert  Unsteadiness on feet - Plan: PT plan of care cert/re-cert  Muscle weakness (generalized) - Plan: PT plan of care cert/re-cert      G-Codes - 16/10/96 1140    Functional Assessment Tool Used (Outpatient Only) FOTO: 44 (56% limited)   Functional Limitation Changing and maintaining body position   Mobility: Walking and Moving Around Current Status (E4540) At least 40 percent but less than 60 percent impaired, limited or restricted   Mobility: Walking and Moving Around Goal Status (J8119) At least 40 percent but less than 60 percent impaired, limited or restricted       Problem List Patient Active Problem List   Diagnosis Date Noted  . Encounter for Medicare annual wellness exam 05/22/2016  . Hyperglycemia 08/07/2015  . Allergic 08/07/2015  . Gout 08/07/2015  . Leukocytosis 08/07/2015  . Lower abdominal pain 04/21/2015  . Acute bronchitis 02/03/2015  . Back pain 11/02/2014  . Obesity 10/28/2014  . Esophageal reflux  10/28/2014  . Essential hypertension, benign 10/28/2014  . Mixed hyperlipidemia 10/28/2014  . Benign essential tremor 10/28/2014  . Bilateral dry eyes   . Asthma   . Arthritis   . Cancer ([place])   . History of chicken pox       Lanney Gins, PT, DPT 08/09/16 11:42 AM   [facility] [address]  [place], [place], [address] Phone: [phone]   Fax:  [phone]  Name: Judea Fennimore MRN: [id number] Date of Birth: 1940/12/08

## 2016-08-09 NOTE — Patient Instructions (Signed)
Hamstring Step 2    Left foot relaxed, knee straight, other leg bent, foot flat. Raise straight leg further upward to maximal range. Hold _30__ seconds. Relax leg completely down. Repeat _3__ times.   Knee to Chest (Flexion)    Pull knee toward chest. Feel stretch in lower back or buttock area. Breathing deeply, Hold __15__ seconds. Repeat with other knee. Repeat __5__ times. Do __2__ sessions per day.  Lumbar Rotation (Non-Weight Bearing)    Feet on floor, slowly rock knees from side to side in small, pain-free range of motion. Allow lower back to rotate slightly. Repeat _15___ times per set.

## 2016-08-11 ENCOUNTER — Ambulatory Visit: Payer: Medicare Other

## 2016-08-11 DIAGNOSIS — R2681 Unsteadiness on feet: Secondary | ICD-10-CM | POA: Diagnosis not present

## 2016-08-11 DIAGNOSIS — R293 Abnormal posture: Secondary | ICD-10-CM

## 2016-08-11 DIAGNOSIS — M545 Low back pain: Secondary | ICD-10-CM | POA: Diagnosis not present

## 2016-08-11 DIAGNOSIS — R262 Difficulty in walking, not elsewhere classified: Secondary | ICD-10-CM

## 2016-08-11 DIAGNOSIS — M6281 Muscle weakness (generalized): Secondary | ICD-10-CM | POA: Diagnosis not present

## 2016-08-11 NOTE — Therapy (Signed)
[facility] [address]  [place], [place], [address] Phone: [phone]   Fax:  [phone]  Physical Therapy Treatment  Patient Details  Name: Amanda Copeland MRN: [id number] Date of Birth: [name] 13, 1941 Referring Provider: Dr. [name]  Encounter Date: 08/11/2016      PT End of Session - 08/11/16 1127    Visit Number 2   Number of Visits 16   Date for PT Re-Evaluation 10/04/16   Authorization Type Medicare   PT Start Time 1105   PT Stop Time 1208   PT Time Calculation (min) 63 min   Activity Tolerance Patient tolerated treatment well   Behavior During Therapy [facility] for tasks assessed/performed      Past Medical History:  Diagnosis Date  . Arthritis   . Asthma    childhood now returning  . Back pain affecting pregnancy 11/02/2014  . Benign essential tremor 10/28/2014  . Bilateral dry eyes   . Cancer (HCC)    Breast  . Dry eyes   . Encounter for Medicare annual wellness exam 05/22/2016  . Essential hypertension, benign 10/28/2014  . H/O measles   . H/O mumps   . History of chicken pox   . Lumbago 11/02/2014  . Mixed hyperlipidemia 10/28/2014  . Obesity 10/28/2014    Past Surgical History:  Procedure Laterality Date  . BACK SURGERY    . BREAST REDUCTION SURGERY Left   . CHOLECYSTECTOMY    . EYE SURGERY  2008   b/l cataracts removed, in [place]  . MASTECTOMY Right   . REPLACEMENT TOTAL KNEE BILATERAL Bilateral   . ROTATOR CUFF REPAIR Left     There were no vitals filed for this visit.      Subjective Assessment - 08/11/16 1221    Subjective Pt. doing well today.  Thinks HEP stretches have made her lower back stiff.     Patient Stated Goals improve balance and use of LE   Currently in Pain? No/denies   Multiple Pain Sites No                         [facility] Adult PT Treatment/Exercise - 08/11/16 1114      Lumbar Exercises: Stretches   Passive Hamstring Stretch 3 reps;30 seconds    Passive Hamstring Stretch Limitations B: supine with strap   Single Knee to Chest Stretch 3 reps;20 seconds   Single Knee to Chest Stretch Limitations B: use of towel   Lower Trunk Rotation Limitations 10 x 10 seconds     Lumbar Exercises: Supine   Ab Set 10 reps;5 seconds   AB Set Limitations Hooklying      Modalities   Modalities Moist Heat;Electrical Stimulation     Moist Heat Therapy   Number Minutes Moist Heat 15 Minutes   Moist Heat Location Lumbar Spine;Hip  R hip      Electrical Stimulation   Electrical Stimulation Location lumbar paraspinals    Electrical Stimulation Action IFC   Electrical Stimulation Parameters to pt. tolerance, 15'    Electrical Stimulation Goals Pain;Tone     Manual Therapy   Manual Therapy Soft tissue mobilization;Myofascial release   Manual therapy comments L sidelying with R LE elevated on bolster; encouraged to attemp self massage with ball at home to both lower back and R buttocks to decrease tone and sensitivity   Soft tissue mobilization STM to B lumbar paraspinals and R glute complex; very ttp; unable  to identify relief during however pain free with following activities   Myofascial Release TPR to R buttocks in piriformis area; ttp throughout glute complex especially over piriformis                   PT Short Term Goals - 08/11/16 1323      PT SHORT TERM GOAL #1   Title patient to be independent with initial HEP (09/06/16)   Status On-going     PT SHORT TERM GOAL #2   Title Patient to demonstrate AROM of L-spine in all planes WFL and symmetrical without pain limiting (09/06/16)   Status On-going           PT Long Term Goals - 08/11/16 1323      PT LONG TERM GOAL #1   Title patient to be independent with advanced HEP (10/04/16)   Status On-going     PT LONG TERM GOAL #2   Title Patient to report ability to ambulate in community over various surfaces without LOB or pain limiting for >/= 20-30 minutes (10/04/16)    Status On-going     PT LONG TERM GOAL #3   Title Patient to report initiation and maintainence of weekly exercise and walking program (10/04/16)   Status On-going     PT LONG TERM GOAL #4   Title Patient to demosntrate good posture and body mechanics needed for daily activities with ability ot self correct (10/04/16)   Status On-going               Plan - 08/11/16 1124    Clinical Impression Statement Pt. doing well today.  Noting she feels HEP stretches have made her lower back stiff.  Tolerated gentle strengthening and stretching well.  Very ttp in glute complex and bilateral lumbar paraspinals thus STM to these areas.  TPR to R piriformis area over point of most tenderness.  E-stim/moist heat applied to lumbar spine and R hip to decrease stiffness and tone.  Pt. ending treatment pain free.      PT Treatment/Interventions ADLs/Self Care Home Management;Cryotherapy;Electrical Stimulation;Moist Heat;Traction;Ultrasound;Neuromuscular re-education;Balance training;Therapeutic exercise;Therapeutic activities;Functional mobility training;Patient/family education;Manual techniques;Vasopneumatic Device;Dry needling;Taping   PT Next Visit Plan PT: assess balance; otherwise continue with gentle stretching and strengthening.       Patient will benefit from skilled therapeutic intervention in order to improve the following deficits and impairments:  Abnormal gait, Decreased activity tolerance, Decreased balance, Decreased mobility, Decreased strength, Difficulty walking, Pain  Visit Diagnosis: Bilateral low back pain, unspecified chronicity, with sciatica presence unspecified  Difficulty in walking, not elsewhere classified  Abnormal posture  Unsteadiness on feet  Muscle weakness (generalized)     Problem List Patient Active Problem List   Diagnosis Date Noted  . Encounter for Medicare annual wellness exam 05/22/2016  . Hyperglycemia 08/07/2015  . Allergic 08/07/2015  . Gout  08/07/2015  . Leukocytosis 08/07/2015  . Lower abdominal pain 04/21/2015  . Acute bronchitis 02/03/2015  . Back pain 11/02/2014  . Obesity 10/28/2014  . Esophageal reflux 10/28/2014  . Essential hypertension, benign 10/28/2014  . Mixed hyperlipidemia 10/28/2014  . Benign essential tremor 10/28/2014  . Bilateral dry eyes   . Asthma   . Arthritis   . Cancer ([place])   . History of chicken pox     Bess Harvest, [place] 08/11/16 1:29 PM  [facility] [address] [facility] [address]  [place], [place], [address] Phone: [phone]   Fax:  [phone]  Name:  Amanda Copeland MRN: [id number] Date of Birth: 11/16/1940

## 2016-08-15 DIAGNOSIS — H43812 Vitreous degeneration, left eye: Secondary | ICD-10-CM | POA: Diagnosis not present

## 2016-08-16 ENCOUNTER — Ambulatory Visit: Payer: Medicare Other | Admitting: Physical Therapy

## 2016-08-16 DIAGNOSIS — M6281 Muscle weakness (generalized): Secondary | ICD-10-CM | POA: Diagnosis not present

## 2016-08-16 DIAGNOSIS — M545 Low back pain: Secondary | ICD-10-CM

## 2016-08-16 DIAGNOSIS — R293 Abnormal posture: Secondary | ICD-10-CM | POA: Diagnosis not present

## 2016-08-16 DIAGNOSIS — R262 Difficulty in walking, not elsewhere classified: Secondary | ICD-10-CM | POA: Diagnosis not present

## 2016-08-16 DIAGNOSIS — R2681 Unsteadiness on feet: Secondary | ICD-10-CM | POA: Diagnosis not present

## 2016-08-16 NOTE — Therapy (Signed)
[facility] [address]  [place], [place], [address] Phone: [phone]   Fax:  [phone]  Physical Therapy Treatment  Patient Details  Name: Amanda Copeland MRN: [id number] Date of Birth: 1940-11-03 Referring Provider: Dr. [name]  Encounter Date: 08/16/2016      PT End of Session - 08/16/16 0849    Visit Number 3   Number of Visits 16   Date for PT Re-Evaluation 10/04/16   Authorization Type Medicare   PT Start Time 0846   PT Stop Time 0952   PT Time Calculation (min) 66 min   Activity Tolerance Patient tolerated treatment well   Behavior During Therapy [facility] for tasks assessed/performed      Past Medical History:  Diagnosis Date  . Arthritis   . Asthma    childhood now returning  . Back pain affecting pregnancy 11/02/2014  . Benign essential tremor 10/28/2014  . Bilateral dry eyes   . Cancer (HCC)    Breast  . Dry eyes   . Encounter for Medicare annual wellness exam 05/22/2016  . Essential hypertension, benign 10/28/2014  . H/O measles   . H/O mumps   . History of chicken pox   . Lumbago 11/02/2014  . Mixed hyperlipidemia 10/28/2014  . Obesity 10/28/2014    Past Surgical History:  Procedure Laterality Date  . BACK SURGERY    . BREAST REDUCTION SURGERY Left   . CHOLECYSTECTOMY    . EYE SURGERY  2008   b/l cataracts removed, in [place]  . MASTECTOMY Right   . REPLACEMENT TOTAL KNEE BILATERAL Bilateral   . ROTATOR CUFF REPAIR Left     There were no vitals filed for this visit.      Subjective Assessment - 08/16/16 0849    Subjective "my legs just don't want to go. I can only walk short distances."   Pertinent History hx of surgery for "ruptured disc", hx of breast cancer   Patient Stated Goals improve balance and use of LE   Currently in Pain? No/denies   Multiple Pain Sites No            [facility] PT Assessment - 08/16/16 0001      Balance   Balance Assessed Yes     Standardized Balance Assessment   Standardized Balance Assessment Berg Balance Test     Berg Balance Test   Sit to Stand Able to stand without using hands and stabilize independently   Standing Unsupported Able to stand 2 minutes with supervision   Sitting with Back Unsupported but Feet Supported on Floor or Stool Able to sit safely and securely 2 minutes   Stand to Sit Sits safely with minimal use of hands   Transfers Able to transfer safely, definite need of hands   Standing Unsupported with Eyes Closed Able to stand 10 seconds with supervision   Standing Ubsupported with Feet Together Able to place feet together independently and stand for 1 minute with supervision   From Standing, Reach Forward with Outstretched Arm Can reach forward >5 cm safely (2")   From Standing Position, Pick up Object from Floor Able to pick up shoe, needs supervision   From Standing Position, Turn to Look Behind Over each Shoulder Looks behind one side only/other side shows less weight shift   Turn 360 Degrees Able to turn 360 degrees safely but slowly   Standing Unsupported, Alternately Place Feet on Step/Stool Able to complete >2 steps/needs minimal assist  Standing Unsupported, One Foot in Front Able to take small step independently and hold 30 seconds   Standing on One Leg Tries to lift leg/unable to hold 3 seconds but remains standing independently   Total Score 38                     [facility] Adult PT Treatment/Exercise - 08/16/16 0851      Exercises   Exercises Knee/Hip     Lumbar Exercises: Stretches   Lower Trunk Rotation Limitations 10 x 10 seconds -feet elevated on orange physioball - PT led     Lumbar Exercises: Aerobic   Stationary Bike NuStep: L4 x 6 minutes     Lumbar Exercises: Supine   Clam 15 reps   Clam Limitations B: red tband   Bent Knee Raise 15 reps   Bent Knee Raise Limitations B: red tband     Knee/Hip Exercises: Supine   Short Arc Quad Sets Both;15 reps   Short  Arc Quad Sets Limitations 2#   Hip Adduction Isometric Both;15 reps   Hip Adduction Isometric Limitations 5 sec hold - small ball   Bridges 15 reps   Bridges Limitations little active movement other than glute set   Straight Leg Raises AAROM;Both;15 reps     Modalities   Modalities Moist Heat;Electrical Stimulation     Moist Heat Therapy   Number Minutes Moist Heat 15 Minutes   Moist Heat Location Lumbar Spine     Electrical Stimulation   Electrical Stimulation Location lumbar paraspinals    Electrical Stimulation Action IFC   Electrical Stimulation Parameters to tolerance   Electrical Stimulation Goals Pain                  PT Short Term Goals - 08/11/16 1323      PT SHORT TERM GOAL #1   Title patient to be independent with initial HEP (09/06/16)   Status On-going     PT SHORT TERM GOAL #2   Title Patient to demonstrate AROM of L-spine in all planes WFL and symmetrical without pain limiting (09/06/16)   Status On-going           PT Long Term Goals - 08/11/16 1323      PT LONG TERM GOAL #1   Title patient to be independent with advanced HEP (10/04/16)   Status On-going     PT LONG TERM GOAL #2   Title Patient to report ability to ambulate in community over various surfaces without LOB or pain limiting for >/= 20-30 minutes (10/04/16)   Status On-going     PT LONG TERM GOAL #3   Title Patient to report initiation and maintainence of weekly exercise and walking program (10/04/16)   Status On-going     PT LONG TERM GOAL #4   Title Patient to demosntrate good posture and body mechanics needed for daily activities with ability ot self correct (10/04/16)   Status On-going               Plan - 08/16/16 0850    Clinical Impression Statement Patient well today - reports minimal to no back pain with primary complaints today of leg weakness. PT session today focusing on general LE and hip strengthening with good tolerance to all tasks. Some active assist  needed for straight leg raises as patient demonstrates difficulty lifting leg against gravity. Will continue to progress towards goals and improved functional mobility. Merrilee Jansky assessed today with patient scoring in moderate fall  risk category.    PT Treatment/Interventions ADLs/Self Care Home Management;Cryotherapy;Electrical Stimulation;Moist Heat;Traction;Ultrasound;Neuromuscular re-education;Balance training;Therapeutic exercise;Therapeutic activities;Functional mobility training;Patient/family education;Manual techniques;Vasopneumatic Device;Dry needling;Taping   Consulted and Agree with Plan of Care Patient      Patient will benefit from skilled therapeutic intervention in order to improve the following deficits and impairments:  Abnormal gait, Decreased activity tolerance, Decreased balance, Decreased mobility, Decreased strength, Difficulty walking, Pain  Visit Diagnosis: Bilateral low back pain, unspecified chronicity, with sciatica presence unspecified  Difficulty in walking, not elsewhere classified  Abnormal posture  Unsteadiness on feet  Muscle weakness (generalized)     Problem List Patient Active Problem List   Diagnosis Date Noted  . Encounter for Medicare annual wellness exam 05/22/2016  . Hyperglycemia 08/07/2015  . Allergic 08/07/2015  . Gout 08/07/2015  . Leukocytosis 08/07/2015  . Lower abdominal pain 04/21/2015  . Acute bronchitis 02/03/2015  . Back pain 11/02/2014  . Obesity 10/28/2014  . Esophageal reflux 10/28/2014  . Essential hypertension, benign 10/28/2014  . Mixed hyperlipidemia 10/28/2014  . Benign essential tremor 10/28/2014  . Bilateral dry eyes   . Asthma   . Arthritis   . Cancer ([place])   . History of chicken pox      Lanney Gins, PT, DPT 08/16/16 10:04 AM   [facility] [address]  [place], [place], [address] Phone: [phone]   Fax:  [phone]  Name: Amanda Copeland MRN: [id number] Date of Birth: [birth year]

## 2016-08-18 ENCOUNTER — Ambulatory Visit: Payer: Medicare Other | Admitting: Physical Therapy

## 2016-08-18 DIAGNOSIS — M545 Low back pain: Secondary | ICD-10-CM

## 2016-08-18 DIAGNOSIS — R293 Abnormal posture: Secondary | ICD-10-CM

## 2016-08-18 DIAGNOSIS — M6281 Muscle weakness (generalized): Secondary | ICD-10-CM | POA: Diagnosis not present

## 2016-08-18 DIAGNOSIS — R262 Difficulty in walking, not elsewhere classified: Secondary | ICD-10-CM

## 2016-08-18 DIAGNOSIS — R2681 Unsteadiness on feet: Secondary | ICD-10-CM

## 2016-08-18 NOTE — Therapy (Signed)
[facility] [address]  [place], [place], [address] Phone: [phone]   Fax:  [phone]  Physical Therapy Treatment  Patient Details  Name: Amanda Copeland MRN: [id number] Date of Birth: 1941/01/06 Referring Provider: Dr. [name]  Encounter Date: 08/18/2016      PT End of Session - 08/18/16 0939    Visit Number 4   Number of Visits 16   Date for PT Re-Evaluation 10/04/16   Authorization Type Medicare   PT Start Time 0934   PT Stop Time 1030   PT Time Calculation (min) 56 min   Activity Tolerance Patient tolerated treatment well   Behavior During Therapy [facility] for tasks assessed/performed      Past Medical History:  Diagnosis Date  . Arthritis   . Asthma    childhood now returning  . Back pain affecting pregnancy 11/02/2014  . Benign essential tremor 10/28/2014  . Bilateral dry eyes   . Cancer (HCC)    Breast  . Dry eyes   . Encounter for Medicare annual wellness exam 05/22/2016  . Essential hypertension, benign 10/28/2014  . H/O measles   . H/O mumps   . History of chicken pox   . Lumbago 11/02/2014  . Mixed hyperlipidemia 10/28/2014  . Obesity 10/28/2014    Past Surgical History:  Procedure Laterality Date  . BACK SURGERY    . BREAST REDUCTION SURGERY Left   . CHOLECYSTECTOMY    . EYE SURGERY  2008   b/l cataracts removed, in [place]  . MASTECTOMY Right   . REPLACEMENT TOTAL KNEE BILATERAL Bilateral   . ROTATOR CUFF REPAIR Left     There were no vitals filed for this visit.      Subjective Assessment - 08/18/16 0935    Subjective Out of breath walking up from car   Pertinent History hx of surgery for "ruptured disc", hx of breast cancer   Patient Stated Goals improve balance and use of LE   Currently in Pain? No/denies   Multiple Pain Sites No                         [facility] Adult PT Treatment/Exercise - 08/18/16 0940      Lumbar Exercises: Aerobic   Stationary  Bike NuStep: L4 x 6 minutes     Knee/Hip Exercises: Standing   Hip Flexion Both;10 reps;Knee bent   Hip Flexion Limitations standing on foam   Hip Abduction Both;15 reps;Knee straight   Abduction Limitations stading on foam   Functional Squat 15 reps   Functional Squat Limitations "mini" - standing on foam     Knee/Hip Exercises: Supine   Straight Leg Raises AAROM;Both;15 reps   Straight Leg Raises Limitations improved from last visit   Other Supine Knee/Hip Exercises straight leg bridge - heels on peanut ball x 15 reps     Knee/Hip Exercises: Sidelying   Clams B LE: red tband x 15 reps each     Modalities   Modalities Moist Heat;Electrical Stimulation     Moist Heat Therapy   Number Minutes Moist Heat 15 Minutes   Moist Heat Location Lumbar Spine     Electrical Stimulation   Electrical Stimulation Location lumbar paraspinals    Electrical Stimulation Action IFC   Electrical Stimulation Parameters to tolerance   Electrical Stimulation Goals Pain                  PT  Short Term Goals - 08/11/16 1323      PT SHORT TERM GOAL #1   Title patient to be independent with initial HEP (09/06/16)   Status On-going     PT SHORT TERM GOAL #2   Title Patient to demonstrate AROM of L-spine in all planes WFL and symmetrical without pain limiting (09/06/16)   Status On-going           PT Long Term Goals - 08/11/16 1323      PT LONG TERM GOAL #1   Title patient to be independent with advanced HEP (10/04/16)   Status On-going     PT LONG TERM GOAL #2   Title Patient to report ability to ambulate in community over various surfaces without LOB or pain limiting for >/= 20-30 minutes (10/04/16)   Status On-going     PT LONG TERM GOAL #3   Title Patient to report initiation and maintainence of weekly exercise and walking program (10/04/16)   Status On-going     PT LONG TERM GOAL #4   Title Patient to demosntrate good posture and body mechanics needed for daily activities  with ability ot self correct (10/04/16)   Status On-going               Plan - 08/18/16 0939    Clinical Impression Statement Patient reporting shortness of breath when walking up from car - normal for her. Patient doing well today with minimal to no pain. Good progress to standing exercises today along with improve ability to perform SLR with less assistance.    PT Treatment/Interventions ADLs/Self Care Home Management;Cryotherapy;Electrical Stimulation;Moist Heat;Traction;Ultrasound;Neuromuscular re-education;Balance training;Therapeutic exercise;Therapeutic activities;Functional mobility training;Patient/family education;Manual techniques;Vasopneumatic Device;Dry needling;Taping   PT Next Visit Plan continue with gentle stretching and strengthening.    Consulted and Agree with Plan of Care Patient      Patient will benefit from skilled therapeutic intervention in order to improve the following deficits and impairments:  Abnormal gait, Decreased activity tolerance, Decreased balance, Decreased mobility, Decreased strength, Difficulty walking, Pain  Visit Diagnosis: Bilateral low back pain, unspecified chronicity, with sciatica presence unspecified  Difficulty in walking, not elsewhere classified  Abnormal posture  Unsteadiness on feet  Muscle weakness (generalized)     Problem List Patient Active Problem List   Diagnosis Date Noted  . Encounter for Medicare annual wellness exam 05/22/2016  . Hyperglycemia 08/07/2015  . Allergic 08/07/2015  . Gout 08/07/2015  . Leukocytosis 08/07/2015  . Lower abdominal pain 04/21/2015  . Acute bronchitis 02/03/2015  . Back pain 11/02/2014  . Obesity 10/28/2014  . Esophageal reflux 10/28/2014  . Essential hypertension, benign 10/28/2014  . Mixed hyperlipidemia 10/28/2014  . Benign essential tremor 10/28/2014  . Bilateral dry eyes   . Asthma   . Arthritis   . Cancer ([place])   . History of chicken pox      Lanney Gins,  PT, DPT 08/18/16 11:53 AM   [facility] [address]  [place], [place], [address] Phone: [phone]   Fax:  [phone]  Name: Amanda Copeland MRN: [id number] Date of Birth: 1940/07/25

## 2016-08-22 ENCOUNTER — Telehealth: Payer: Self-pay | Admitting: Family Medicine

## 2016-08-22 ENCOUNTER — Other Ambulatory Visit: Payer: Self-pay | Admitting: Family Medicine

## 2016-08-22 ENCOUNTER — Ambulatory Visit (INDEPENDENT_AMBULATORY_CARE_PROVIDER_SITE_OTHER): Payer: Medicare Other | Admitting: Family Medicine

## 2016-08-22 ENCOUNTER — Ambulatory Visit (HOSPITAL_BASED_OUTPATIENT_CLINIC_OR_DEPARTMENT_OTHER)
Admission: RE | Admit: 2016-08-22 | Discharge: 2016-08-22 | Disposition: A | Discharge status: Home / Self Care | Payer: Medicare Other | Source: Ambulatory Visit | Attending: Family Medicine | Admitting: Family Medicine

## 2016-08-22 ENCOUNTER — Encounter: Payer: Self-pay | Admitting: Family Medicine

## 2016-08-22 VITALS — BP 128/61 | HR 56 | Temp 97.9°F | Ht 61.0 in | Wt 246.6 lb

## 2016-08-22 DIAGNOSIS — J40 Bronchitis, not specified as acute or chronic: Secondary | ICD-10-CM | POA: Insufficient documentation

## 2016-08-22 DIAGNOSIS — J209 Acute bronchitis, unspecified: Secondary | ICD-10-CM

## 2016-08-22 DIAGNOSIS — R0609 Other forms of dyspnea: Secondary | ICD-10-CM | POA: Diagnosis not present

## 2016-08-22 DIAGNOSIS — R0602 Shortness of breath: Secondary | ICD-10-CM | POA: Diagnosis not present

## 2016-08-22 NOTE — Progress Notes (Signed)
Chief Complaint  Patient presents with  . Shortness of Breath    x 2-3 months-worse today    Subjective: Patient is a 76 y.o. female here for SOB.  This is been going on for 2-3 months and is progressively worsening. It is worse it is ever been as of today. Every time she walks or physically exerts herself she starts to have shortness of breath. It lasts for 5-10 minutes. She has a history of asthma and has been using her inhalers without relief. She does admit to an unintentional 3-5 pound weight gain. She does not feel that she is retaining fluid. No change in diet. She denies any fevers, cough, chest pain, jaw pain, arm pain.  ROS: Heart: Denies chest pain  Lungs: +SOB, no cough  Family History  Problem Relation Age of Onset  . Heart disease Mother   . Heart attack Mother   . Heart failure Mother   . Hypertension Mother   . Cancer Father        colon  . Asthma Father   . Parkinson's disease Father   . Cancer Maternal Grandmother        uterine  . Heart disease Maternal Grandfather   . Obesity Daughter   . Appendicitis Paternal Grandmother   . Stroke Neg Hx    Past Medical History:  Diagnosis Date  . Arthritis   . Asthma    childhood now returning  . Back pain affecting pregnancy 11/02/2014  . Benign essential tremor 10/28/2014  . Bilateral dry eyes   . Cancer (HCC)    Breast  . Dry eyes   . Encounter for Medicare annual wellness exam 05/22/2016  . Essential hypertension, benign 10/28/2014  . H/O measles   . H/O mumps   . History of chicken pox   . Lumbago 11/02/2014  . Mixed hyperlipidemia 10/28/2014  . Obesity 10/28/2014   Allergies  Allergen Reactions  . Amoxicillin Rash  . Ampicillin Rash  . Penicillins Rash  . Statins Other (See Comments)    Per reports muscle aches and joint pain    Current Outpatient Prescriptions:  .  allopurinol (ZYLOPRIM) 100 MG tablet, Take 1 tablet (100 mg total) by mouth daily., Disp: 60 tablet, Rfl: 3 .  aspirin 81 MG tablet,  Take 81 mg by mouth daily., Disp: , Rfl:  .  ciclesonide (OMNARIS) 50 MCG/ACT nasal spray, Place 2 sprays into both nostrils daily., Disp: 12.5 g, Rfl: 6 .  diltiazem (TIAZAC) 180 MG 24 hr capsule, Take 1 capsule (180 mg total) by mouth daily., Disp: 90 capsule, Rfl: 2 .  PREVACID 30 MG capsule, TAKE ONE CAPSULE BY MOUTH TWICE A DAY, Disp: 60 capsule, Rfl: 6 .  PROAIR HFA 108 (90 Base) MCG/ACT inhaler, INHALE 2 PUFFS INTO THE LUNGS EVERY 6 (SIX) HOURS AS NEEDED FOR WHEEZING OR SHORTNESS OF BREATH., Disp: 8.5 Inhaler, Rfl: 0 .  propranolol (INDERAL) 20 MG tablet, TAKE 1 TABLET (20 MG TOTAL) BY MOUTH 2 (TWO) TIMES DAILY., Disp: 180 tablet, Rfl: 1 .  PULMICORT FLEXHALER 90 MCG/ACT inhaler, INHALE 2 PUFFS INTO THE LUNGS 2 (TWO) TIMES DAILY., Disp: 1 each, Rfl: 1 .  Red Yeast Rice Extract (RED YEAST RICE PO), Take by mouth daily., Disp: , Rfl:  .  tizanidine (ZANAFLEX) 2 MG capsule, Take 1 capsule (2 mg total) by mouth at bedtime as needed for muscle spasms., Disp: 30 capsule, Rfl: 2 .  triamterene-hydrochlorothiazide (DYAZIDE) 37.5-25 MG capsule, Take 1 each (1 capsule total)  by mouth daily., Disp: 90 capsule, Rfl: 2  Objective: BP 128/61 (BP Location: Left Wrist, Patient Position: Sitting, Cuff Size: Normal)   Pulse (!) 56   Temp 97.9 F (36.6 C) (Oral)   Ht 5\' 1"  (1.549 m)   Wt 246 lb 9.6 oz (111.9 kg)   SpO2 97%   BMI 46.59 kg/m  General: Awake, appears stated age HEENT: MMM, EOMi Neck: No JVD, no asymmetry Heart: RRR, no murmurs/gallops appreciated, no bruits, 2+ pitting edema up to prox 1/3 tibia b/l Lungs: CTAB, no rales, wheezes or rhonchi. No accessory muscle use Abd: BS+, soft, diffusely TTP (no change), neg hepatojug reflex MSK: +Diffuse TTP in LE's (no change) Psych: Age appropriate judgment and insight, normal affect and mood  Assessment and Plan: DOE (dyspnea on exertion) - Plan: EKG 12-Lead, CBC w/Diff, Comprehensive metabolic panel, DG Chest 2 View  I do not appreciate a  significant change from her previous EKG. No signs of ischemia or ST segment elevation/depression. Will order Echo if all workup is neg.  She has a cardiologist, strongly denied chest pain on several occasions.  I am not convinced this is related to her asthma.  She does not have risk factors or physical exam findings suggestive of a PE. F/u pending the above. The patient voiced understanding and agreement to the plan.  Baker, DO 08/22/16  5:19 PM

## 2016-08-22 NOTE — Telephone Encounter (Signed)
Patient is scheduled today 716/2018 with Dr. Nani Ravens.

## 2016-08-22 NOTE — Telephone Encounter (Signed)
Called to follow-up with patient regarding the below medical concern. Unable to reach patient at this time. Left message for a call back.

## 2016-08-22 NOTE — Patient Instructions (Signed)
If all of your studies are normal, we will order an ultrasound of your heart.

## 2016-08-22 NOTE — Telephone Encounter (Signed)
Caller name: Kayci [place] Relationship to patient: self Can be reached: [phone]   Reason for call: pt called in with SOB. She states she has asthma and inhalers not working well. Declined [facility] transfer. Scheduled today 4:15pm as pt requested Dr. Charlett Blake or Dr. Nani Ravens.

## 2016-08-23 LAB — CBC WITH DIFFERENTIAL/PLATELET
BASOS PCT: 1.6 % (ref 0.0–3.0)
Basophils Absolute: 0.1 10*3/uL (ref 0.0–0.1)
Eosinophils Absolute: 0.2 10*3/uL (ref 0.0–0.7)
Eosinophils Relative: 2.6 % (ref 0.0–5.0)
HCT: 41.3 % (ref 36.0–46.0)
Hemoglobin: 13.8 g/dL (ref 12.0–15.0)
LYMPHS ABS: 2.2 10*3/uL (ref 0.7–4.0)
Lymphocytes Relative: 33.5 % (ref 12.0–46.0)
MCHC: 33.5 g/dL (ref 30.0–36.0)
MCV: 94.2 fl (ref 78.0–100.0)
MONO ABS: 0.6 10*3/uL (ref 0.1–1.0)
Monocytes Relative: 8.8 % (ref 3.0–12.0)
NEUTROS ABS: 3.5 10*3/uL (ref 1.4–7.7)
NEUTROS PCT: 53.5 % (ref 43.0–77.0)
PLATELETS: 352 10*3/uL (ref 150.0–400.0)
RBC: 4.39 Mil/uL (ref 3.87–5.11)
RDW: 15 % (ref 11.5–15.5)
WBC: 6.6 10*3/uL (ref 4.0–10.5)

## 2016-08-23 LAB — COMPREHENSIVE METABOLIC PANEL
ALT: 13 U/L (ref 0–35)
AST: 20 U/L (ref 0–37)
Albumin: 4 g/dL (ref 3.5–5.2)
Alkaline Phosphatase: 74 U/L (ref 39–117)
BUN: 17 mg/dL (ref 6–23)
CHLORIDE: 101 meq/L (ref 96–112)
CO2: 27 meq/L (ref 19–32)
CREATININE: 0.9 mg/dL (ref 0.40–1.20)
Calcium: 9.7 mg/dL (ref 8.4–10.5)
GFR: 64.58 mL/min (ref >60.00)
Glucose, Bld: 89 mg/dL (ref 70–99)
POTASSIUM: 4 meq/L (ref 3.5–5.1)
SODIUM: 139 meq/L (ref 135–145)
Total Bilirubin: 0.3 mg/dL (ref 0.2–1.2)
Total Protein: 6.8 g/dL (ref 6.0–8.3)

## 2016-08-24 ENCOUNTER — Other Ambulatory Visit: Payer: Self-pay | Admitting: Family Medicine

## 2016-08-24 ENCOUNTER — Encounter: Payer: Self-pay | Admitting: Family Medicine

## 2016-08-24 ENCOUNTER — Ambulatory Visit: Payer: Medicare Other | Admitting: Physical Therapy

## 2016-08-24 DIAGNOSIS — R262 Difficulty in walking, not elsewhere classified: Secondary | ICD-10-CM

## 2016-08-24 DIAGNOSIS — R0609 Other forms of dyspnea: Secondary | ICD-10-CM

## 2016-08-24 DIAGNOSIS — M545 Low back pain: Secondary | ICD-10-CM | POA: Diagnosis not present

## 2016-08-24 DIAGNOSIS — R293 Abnormal posture: Secondary | ICD-10-CM | POA: Diagnosis not present

## 2016-08-24 DIAGNOSIS — M6281 Muscle weakness (generalized): Secondary | ICD-10-CM | POA: Diagnosis not present

## 2016-08-24 DIAGNOSIS — R2681 Unsteadiness on feet: Secondary | ICD-10-CM | POA: Diagnosis not present

## 2016-08-24 NOTE — Progress Notes (Signed)
Referral to pulm placed per patient's request.

## 2016-08-24 NOTE — Progress Notes (Signed)
Echo ordered, pt notified via [place].

## 2016-08-24 NOTE — Therapy (Signed)
[facility] [address]  [place], [place], [address] Phone: [phone]   Fax:  [phone]  Physical Therapy Treatment  Patient Details  Name: Amanda Copeland MRN: [id number] Date of Birth: 1940/09/29 Referring Provider: Dr. [name]  Encounter Date: 08/24/2016      PT End of Session - 08/24/16 0937    Visit Number 5   Number of Visits 16   Date for PT Re-Evaluation 10/04/16   Authorization Type Medicare   PT Start Time 0936   PT Stop Time 1016   PT Time Calculation (min) 40 min   Activity Tolerance Patient tolerated treatment well   Behavior During Therapy [facility] for tasks assessed/performed      Past Medical History:  Diagnosis Date  . Arthritis   . Asthma    childhood now returning  . Back pain affecting pregnancy 11/02/2014  . Benign essential tremor 10/28/2014  . Bilateral dry eyes   . Cancer (HCC)    Breast  . Dry eyes   . Encounter for Medicare annual wellness exam 05/22/2016  . Essential hypertension, benign 10/28/2014  . H/O measles   . H/O mumps   . History of chicken pox   . Lumbago 11/02/2014  . Mixed hyperlipidemia 10/28/2014  . Obesity 10/28/2014    Past Surgical History:  Procedure Laterality Date  . BACK SURGERY    . BREAST REDUCTION SURGERY Left   . CHOLECYSTECTOMY    . EYE SURGERY  2008   b/l cataracts removed, in [place]  . MASTECTOMY Right   . REPLACEMENT TOTAL KNEE BILATERAL Bilateral   . ROTATOR CUFF REPAIR Left     There were no vitals filed for this visit.      Subjective Assessment - 08/24/16 0936    Subjective doing well - "my legs are still weak, and I'm still short of breath"   Pertinent History hx of surgery for "ruptured disc", hx of breast cancer   Patient Stated Goals improve balance and use of LE   Currently in Pain? No/denies                         [facility] Adult PT Treatment/Exercise - 08/24/16 0940      Lumbar Exercises: Aerobic   Stationary Bike NuStep: L5 x 6 minutes     Knee/Hip Exercises: Standing   Hip Flexion Right;Left;15 reps;Knee bent   Hip Flexion Limitations standing on foam; 2# at ankles   Hip Abduction Right;Left;15 reps;Knee straight   Abduction Limitations stading on foam; 2# at ankles   Hip Extension Right;Left;15 reps;Knee straight   Extension Limitations standing on foam; 2# at ankles     Knee/Hip Exercises: Seated   Other Seated Knee/Hip Exercises Fitter - 1 blue/1 black x 15 each leg   Hamstring Curl Left;Right;15 reps   Hamstring Limitations freen tband - 2# at ankles   Sit to Sand 15 reps;without UE support  standing on foam                  PT Short Term Goals - 08/11/16 1323      PT SHORT TERM GOAL #1   Title patient to be independent with initial HEP (09/06/16)   Status On-going     PT SHORT TERM GOAL #2   Title Patient to demonstrate AROM of L-spine in all planes WFL and symmetrical without pain limiting (09/06/16)   Status On-going  PT Long Term Goals - 08/11/16 1323      PT LONG TERM GOAL #1   Title patient to be independent with advanced HEP (10/04/16)   Status On-going     PT LONG TERM GOAL #2   Title Patient to report ability to ambulate in community over various surfaces without LOB or pain limiting for >/= 20-30 minutes (10/04/16)   Status On-going     PT LONG TERM GOAL #3   Title Patient to report initiation and maintainence of weekly exercise and walking program (10/04/16)   Status On-going     PT LONG TERM GOAL #4   Title Patient to demosntrate good posture and body mechanics needed for daily activities with ability ot self correct (10/04/16)   Status On-going               Plan - 08/24/16 1031    Clinical Impression Statement Patient reporting she saw MD this week due to shortness of breath - waiting to hear results. Patient with subjective reports of little to no back pain with leg weakness of greatest concern today. PT session  today focusing on functional strength of B LE with patient responding well to all tasks. WIll continue to progress towards goals.    PT Treatment/Interventions ADLs/Self Care Home Management;Cryotherapy;Electrical Stimulation;Moist Heat;Traction;Ultrasound;Neuromuscular re-education;Balance training;Therapeutic exercise;Therapeutic activities;Functional mobility training;Patient/family education;Manual techniques;Vasopneumatic Device;Dry needling;Taping   PT Next Visit Plan continue with gentle stretching and strengthening.    Consulted and Agree with Plan of Care Patient      Patient will benefit from skilled therapeutic intervention in order to improve the following deficits and impairments:  Abnormal gait, Decreased activity tolerance, Decreased balance, Decreased mobility, Decreased strength, Difficulty walking, Pain  Visit Diagnosis: Bilateral low back pain, unspecified chronicity, with sciatica presence unspecified  Difficulty in walking, not elsewhere classified  Abnormal posture  Unsteadiness on feet  Muscle weakness (generalized)     Problem List Patient Active Problem List   Diagnosis Date Noted  . Encounter for Medicare annual wellness exam 05/22/2016  . Hyperglycemia 08/07/2015  . Allergic 08/07/2015  . Gout 08/07/2015  . Leukocytosis 08/07/2015  . Lower abdominal pain 04/21/2015  . Acute bronchitis 02/03/2015  . Back pain 11/02/2014  . Obesity 10/28/2014  . Esophageal reflux 10/28/2014  . Essential hypertension, benign 10/28/2014  . Mixed hyperlipidemia 10/28/2014  . Benign essential tremor 10/28/2014  . Bilateral dry eyes   . Asthma   . Arthritis   . Cancer ([place])   . History of chicken pox      Lanney Gins, PT, DPT 08/24/16 10:44 AM   [facility] [address]  [place], [place], [address] Phone: [phone]   Fax:  [phone]  Name: Amanda Copeland MRN: [id number] Date of Birth:  08/30/1940

## 2016-08-26 ENCOUNTER — Ambulatory Visit: Payer: Medicare Other

## 2016-08-26 DIAGNOSIS — M545 Low back pain: Secondary | ICD-10-CM

## 2016-08-26 DIAGNOSIS — R293 Abnormal posture: Secondary | ICD-10-CM

## 2016-08-26 DIAGNOSIS — M6281 Muscle weakness (generalized): Secondary | ICD-10-CM

## 2016-08-26 DIAGNOSIS — R2681 Unsteadiness on feet: Secondary | ICD-10-CM

## 2016-08-26 DIAGNOSIS — R262 Difficulty in walking, not elsewhere classified: Secondary | ICD-10-CM

## 2016-08-26 NOTE — Therapy (Signed)
[facility] [address]  [place], [place], [address] Phone: [phone]   Fax:  [phone]  Physical Therapy Treatment  Patient Details  Name: Amanda Copeland MRN: [id number] Date of Birth: March 19, 1940 Referring Provider: Dr. [name]  Encounter Date: 08/26/2016      PT End of Session - 08/26/16 0852    Visit Number 6   Number of Visits 16   Date for PT Re-Evaluation 10/04/16   Authorization Type Medicare   PT Start Time 0848   PT Stop Time 0929   PT Time Calculation (min) 41 min   Activity Tolerance Patient tolerated treatment well   Behavior During Therapy [facility] for tasks assessed/performed      Past Medical History:  Diagnosis Date  . Arthritis   . Asthma    childhood now returning  . Back pain affecting pregnancy 11/02/2014  . Benign essential tremor 10/28/2014  . Bilateral dry eyes   . Cancer (HCC)    Breast  . Dry eyes   . Encounter for Medicare annual wellness exam 05/22/2016  . Essential hypertension, benign 10/28/2014  . H/O measles   . H/O mumps   . History of chicken pox   . Lumbago 11/02/2014  . Mixed hyperlipidemia 10/28/2014  . Obesity 10/28/2014    Past Surgical History:  Procedure Laterality Date  . BACK SURGERY    . BREAST REDUCTION SURGERY Left   . CHOLECYSTECTOMY    . EYE SURGERY  2008   [place]  . MASTECTOMY Right   . REPLACEMENT TOTAL KNEE BILATERAL Bilateral   . ROTATOR CUFF REPAIR Left     There were no vitals filed for this visit.      Subjective Assessment - 08/26/16 0851    Subjective Pt. noting still short of breath - no answer back from MD.     Patient Stated Goals improve balance and use of LE   Currently in Pain? No/denies   Multiple Pain Sites No                         [facility] Adult PT Treatment/Exercise - 08/26/16 5462      Neuro Re-ed    Neuro Re-ed Details  B tandem stance 2 x 30 sec each; 2 ski poles; B SLS 3  x 10 sec each leg at counter      Lumbar Exercises: Aerobic   Stationary Bike NuStep: L3 x 6 minutes  lower resistance due to reports of increased short OB     Knee/Hip Exercises: Standing   Knee Flexion Right;Left;15 reps   Knee Flexion Limitations 2#; at counter    Hip Abduction Right;Left;Knee straight;10 reps  pt. requesting to stop after 10 reps    Abduction Limitations 2#; at counter    Hip Extension Right;Left;Knee straight;10 reps  pt. requesting to stop after 10 reps    Other Standing Knee Exercises airex side step up and over 3 x 5 reps; 2 HH support from therapist    Other Standing Knee Exercises Alteranting toe-tap on 9" step standing on airex pad 2 x 10 reps each      Knee/Hip Exercises: Seated   Sit to Sand 2 sets;10 reps;without UE support                PT Education - 08/26/16 0935    Education provided Yes   Education Details staggered stance at counter, B  SLS at [employer]) Educated Patient   Methods Explanation;Demonstration;Verbal cues;Handout   Comprehension Verbalized understanding;Returned demonstration;Verbal cues required;Need further instruction          PT Short Term Goals - 08/11/16 1323      PT SHORT TERM GOAL #1   Title patient to be independent with initial HEP (09/06/16)   Status On-going     PT SHORT TERM GOAL #2   Title Patient to demonstrate AROM of L-spine in all planes WFL and symmetrical without pain limiting (09/06/16)   Status On-going           PT Long Term Goals - 08/11/16 1323      PT LONG TERM GOAL #1   Title patient to be independent with advanced HEP (10/04/16)   Status On-going     PT LONG TERM GOAL #2   Title Patient to report ability to ambulate in community over various surfaces without LOB or pain limiting for >/= 20-30 minutes (10/04/16)   Status On-going     PT LONG TERM GOAL #3   Title Patient to report initiation and maintainence of weekly exercise and walking program (10/04/16)   Status  On-going     PT LONG TERM GOAL #4   Title Patient to demosntrate good posture and body mechanics needed for daily activities with ability ot self correct (10/04/16)   Status On-going               Plan - 08/26/16 0903    Clinical Impression Statement Kailin still has not gotten an answer back from MD regarding results from testing related to shortness of breath.  Period rest breaks taken today to allow pt. to "catch breath".  Treatment focusing on standing balance and LE strengthening activities to pt. tolerance.  Pt. performed well and HEP updated with more balance activities.   PT Treatment/Interventions ADLs/Self Care Home Management;Cryotherapy;Electrical Stimulation;Moist Heat;Traction;Ultrasound;Neuromuscular re-education;Balance training;Therapeutic exercise;Therapeutic activities;Functional mobility training;Patient/family education;Manual techniques;Vasopneumatic Device;Dry needling;Taping   PT Next Visit Plan continue with gentle stretching and strengthening       Patient will benefit from skilled therapeutic intervention in order to improve the following deficits and impairments:  Abnormal gait, Decreased activity tolerance, Decreased balance, Decreased mobility, Decreased strength, Difficulty walking, Pain  Visit Diagnosis: Bilateral low back pain, unspecified chronicity, with sciatica presence unspecified  Difficulty in walking, not elsewhere classified  Abnormal posture  Unsteadiness on feet  Muscle weakness (generalized)     Problem List Patient Active Problem List   Diagnosis Date Noted  . Encounter for Medicare annual wellness exam 05/22/2016  . Hyperglycemia 08/07/2015  . Allergic 08/07/2015  . Gout 08/07/2015  . Leukocytosis 08/07/2015  . Lower abdominal pain 04/21/2015  . Acute bronchitis 02/03/2015  . Back pain 11/02/2014  . Obesity 10/28/2014  . Esophageal reflux 10/28/2014  . Essential hypertension, benign 10/28/2014  . Mixed hyperlipidemia  10/28/2014  . Benign essential tremor 10/28/2014  . Bilateral dry eyes   . Asthma   . Arthritis   . Cancer ([place])   . History of chicken pox     Bess Harvest, [place] 08/26/16 9:42 AM  [facility] [address]  [place], [place], [address] Phone: [phone]   Fax:  [phone]  Name: Genecis Veley MRN: [id number] Date of Birth: 08-16-1940

## 2016-08-29 ENCOUNTER — Ambulatory Visit: Payer: Medicare Other

## 2016-08-31 ENCOUNTER — Ambulatory Visit: Payer: Medicare Other | Admitting: Physical Therapy

## 2016-09-06 ENCOUNTER — Other Ambulatory Visit: Payer: Self-pay | Admitting: *Deleted

## 2016-09-06 MED ORDER — ALLOPURINOL 100 MG PO TABS
100.0000 mg | ORAL_TABLET | Freq: Every day | ORAL | 0 refills | Status: DC
Start: 2016-09-06 — End: 2017-09-15

## 2016-09-07 ENCOUNTER — Ambulatory Visit: Payer: Medicare Other | Attending: Family Medicine | Admitting: Physical Therapy

## 2016-09-07 ENCOUNTER — Other Ambulatory Visit (HOSPITAL_BASED_OUTPATIENT_CLINIC_OR_DEPARTMENT_OTHER): Payer: Self-pay

## 2016-09-07 DIAGNOSIS — M545 Low back pain: Secondary | ICD-10-CM | POA: Diagnosis not present

## 2016-09-07 DIAGNOSIS — R2681 Unsteadiness on feet: Secondary | ICD-10-CM | POA: Diagnosis not present

## 2016-09-07 DIAGNOSIS — M6281 Muscle weakness (generalized): Secondary | ICD-10-CM | POA: Insufficient documentation

## 2016-09-07 DIAGNOSIS — R262 Difficulty in walking, not elsewhere classified: Secondary | ICD-10-CM | POA: Diagnosis not present

## 2016-09-07 DIAGNOSIS — R293 Abnormal posture: Secondary | ICD-10-CM | POA: Diagnosis not present

## 2016-09-07 NOTE — Therapy (Signed)
[facility] [address]  [place], [place], [address] Phone: [phone]   Fax:  [phone]  Physical Therapy Treatment  Patient Details  Name: Amanda Copeland MRN: [id number] Date of Birth: [birth year] Referring Provider: Dr. [name]  Encounter Date: 09/07/2016      PT End of Session - 09/07/16 0939    Visit Number 7   Number of Visits 16   Date for PT Re-Evaluation 10/04/16   Authorization Type Medicare   PT Start Time 0934   PT Stop Time 1016   PT Time Calculation (min) 42 min   Activity Tolerance Patient tolerated treatment well   Behavior During Therapy [facility] for tasks assessed/performed      Past Medical History:  Diagnosis Date  . Arthritis   . Asthma    childhood now returning  . Back pain affecting pregnancy 11/02/2014  . Benign essential tremor 10/28/2014  . Bilateral dry eyes   . Cancer (HCC)    Breast  . Dry eyes   . Encounter for Medicare annual wellness exam 05/22/2016  . Essential hypertension, benign 10/28/2014  . H/O measles   . H/O mumps   . History of chicken pox   . Lumbago 11/02/2014  . Mixed hyperlipidemia 10/28/2014  . Obesity 10/28/2014    Past Surgical History:  Procedure Laterality Date  . BACK SURGERY    . BREAST REDUCTION SURGERY Left   . CHOLECYSTECTOMY    . EYE SURGERY  2008   b/l cataracts removed, in [place]  . MASTECTOMY Right   . REPLACEMENT TOTAL KNEE BILATERAL Bilateral   . ROTATOR CUFF REPAIR Left     There were no vitals filed for this visit.      Subjective Assessment - 09/07/16 0938    Subjective "I barely made it up here" - feeling SOB; has some low back pain after treatment session   Pertinent History hx of surgery for "ruptured disc", hx of breast cancer   Patient Stated Goals improve balance and use of LE   Currently in Pain? No/denies   Multiple Pain Sites No                         [facility] Adult PT Treatment/Exercise - 09/07/16  0941      Lumbar Exercises: Stretches   Quadruped Mid Back Stretch Limitations ball walkouts - seated edge of mat table - fwd/R/L      Lumbar Exercises: Aerobic   Stationary Bike NuStep: L5 x 6 minutes - BUE/BLE     Knee/Hip Exercises: Standing   Heel Raises Both;15 reps   Heel Raises Limitations 2# at ankle   Hip Abduction Right;Left;15 reps;Knee straight   Abduction Limitations 2#   Other Standing Knee Exercises narrow BOS on AirEx with perturbations from PT     Knee/Hip Exercises: Seated   Long Arc Quad Right;Left;15 reps;Weights   Long Arc Quad Weight 3 lbs.   Hamstring Curl Left;Right;15 reps   Hamstring Limitations green tband - 3# at ankles   Sit to Sand --  mini squat to mat table                  PT Short Term Goals - 09/07/16 0940      PT SHORT TERM GOAL #1   Title patient to be independent with initial HEP (09/06/16)   Status Achieved     PT SHORT TERM GOAL #2   Title  Patient to demonstrate AROM of L-spine in all planes WFL and symmetrical without pain limiting (09/06/16)   Status Achieved           PT Long Term Goals - 08/11/16 1323      PT LONG TERM GOAL #1   Title patient to be independent with advanced HEP (10/04/16)   Status On-going     PT LONG TERM GOAL #2   Title Patient to report ability to ambulate in community over various surfaces without LOB or pain limiting for >/= 20-30 minutes (10/04/16)   Status On-going     PT LONG TERM GOAL #3   Title Patient to report initiation and maintainence of weekly exercise and walking program (10/04/16)   Status On-going     PT LONG TERM GOAL #4   Title Patient to demosntrate good posture and body mechanics needed for daily activities with ability ot self correct (10/04/16)   Status On-going               Plan - 09/07/16 0940    Clinical Impression Statement Monesha today with continued primary concerns over difficulty breathing as wel as general LE weakness. Patient able to perform all  activities within session, however, frequent rest breaks needed with O2 sats checked frequently, and all remaining above 93%. WIll continue to prgress strength, balance, and general low back mobility for improved functional mobility.    PT Treatment/Interventions ADLs/Self Care Home Management;Cryotherapy;Electrical Stimulation;Moist Heat;Traction;Ultrasound;Neuromuscular re-education;Balance training;Therapeutic exercise;Therapeutic activities;Functional mobility training;Patient/family education;Manual techniques;Vasopneumatic Device;Dry needling;Taping   PT Next Visit Plan continue with gentle stretching and strengthening    Consulted and Agree with Plan of Care Patient      Patient will benefit from skilled therapeutic intervention in order to improve the following deficits and impairments:  Abnormal gait, Decreased activity tolerance, Decreased balance, Decreased mobility, Decreased strength, Difficulty walking, Pain  Visit Diagnosis: Bilateral low back pain, unspecified chronicity, with sciatica presence unspecified  Difficulty in walking, not elsewhere classified  Abnormal posture  Unsteadiness on feet  Muscle weakness (generalized)     Problem List Patient Active Problem List   Diagnosis Date Noted  . Encounter for Medicare annual wellness exam 05/22/2016  . Hyperglycemia 08/07/2015  . Allergic 08/07/2015  . Gout 08/07/2015  . Leukocytosis 08/07/2015  . Lower abdominal pain 04/21/2015  . Acute bronchitis 02/03/2015  . Back pain 11/02/2014  . Obesity 10/28/2014  . Esophageal reflux 10/28/2014  . Essential hypertension, benign 10/28/2014  . Mixed hyperlipidemia 10/28/2014  . Benign essential tremor 10/28/2014  . Bilateral dry eyes   . Asthma   . Arthritis   . Cancer ([place])   . History of chicken pox     Lanney Gins, PT, DPT 09/07/16 1:53 PM   [facility] [address]  [place], [place],  [address] Phone: [phone]   Fax:  [phone]  Name: Raynelle Fujikawa MRN: [id number] Date of Birth: May 31, 1940

## 2016-09-09 ENCOUNTER — Ambulatory Visit: Payer: Medicare Other

## 2016-09-09 DIAGNOSIS — M6281 Muscle weakness (generalized): Secondary | ICD-10-CM | POA: Diagnosis not present

## 2016-09-09 DIAGNOSIS — R293 Abnormal posture: Secondary | ICD-10-CM | POA: Diagnosis not present

## 2016-09-09 DIAGNOSIS — M545 Low back pain: Secondary | ICD-10-CM

## 2016-09-09 DIAGNOSIS — R2681 Unsteadiness on feet: Secondary | ICD-10-CM

## 2016-09-09 DIAGNOSIS — R262 Difficulty in walking, not elsewhere classified: Secondary | ICD-10-CM | POA: Diagnosis not present

## 2016-09-09 NOTE — Therapy (Addendum)
[facility] [address]  [place], [place], [address] Phone: [phone]   Fax:  [phone]  Physical Therapy Treatment  Patient Details  Name: Amanda Copeland MRN: [id number] Date of Birth: 07-04-1940 Referring Provider: Dr. [name]  Encounter Date: 09/09/2016      PT End of Session - 09/09/16 0942    Visit Number 8   Number of Visits 16   Date for PT Re-Evaluation 10/04/16   Authorization Type Medicare   PT Start Time 0931   PT Stop Time 1015   PT Time Calculation (min) 44 min   Activity Tolerance Patient tolerated treatment well   Behavior During Therapy [facility] for tasks assessed/performed      Past Medical History:  Diagnosis Date  . Arthritis   . Asthma    childhood now returning  . Back pain affecting pregnancy 11/02/2014  . Benign essential tremor 10/28/2014  . Bilateral dry eyes   . Cancer (HCC)    Breast  . Dry eyes   . Encounter for Medicare annual wellness exam 05/22/2016  . Essential hypertension, benign 10/28/2014  . H/O measles   . H/O mumps   . History of chicken pox   . Lumbago 11/02/2014  . Mixed hyperlipidemia 10/28/2014  . Obesity 10/28/2014    Past Surgical History:  Procedure Laterality Date  . BACK SURGERY    . BREAST REDUCTION SURGERY Left   . CHOLECYSTECTOMY    . EYE SURGERY  2008   b/l cataracts removed, in [place]  . MASTECTOMY Right   . REPLACEMENT TOTAL KNEE BILATERAL Bilateral   . ROTATOR CUFF REPAIR Left     There were no vitals filed for this visit.      Subjective Assessment - 09/09/16 0934    Subjective Pt. doing well today.  No complaints other than continued shortness of breath with exertion.   Patient Stated Goals improve balance and use of LE   Currently in Pain? No/denies   Multiple Pain Sites No                         [facility] Adult PT Treatment/Exercise - 09/09/16 0943      Lumbar Exercises: Aerobic   Stationary Bike NuStep: L5 x 6  minutes - BUE/BLE     Lumbar Exercises: Machines for Strengthening   Cybex Knee Flexion BATCA HS curl 20# x 10 reps; B LE's    Other Lumbar Machine Exercise BATCA low row 15# x 15 reps     Knee/Hip Exercises: Standing   Heel Raises Both;15 reps   Heel Raises Limitations at counter    Hip Abduction Right;Left;15 reps;Knee straight   Abduction Limitations 2#; at counter    Hip Extension Right;Left;Knee straight;10 reps   Extension Limitations 2#; at counter     Knee/Hip Exercises: Seated   Long Arc Quad Right;Left;Weights;15 reps   Long Arc Quad Weight 3 lbs.   Hamstring Curl Left;Right;15 reps   Hamstring Limitations green tband    Sit to Sand 2 sets;without UE support;15 reps  sitting rest break Sats % up to 95 with rest                   PT Short Term Goals - 09/07/16 0940      PT SHORT TERM GOAL #1   Title patient to be independent with initial HEP (09/06/16)   Status Achieved     PT SHORT  TERM GOAL #2   Title Patient to demonstrate AROM of L-spine in all planes WFL and symmetrical without pain limiting (09/06/16)   Status Achieved           PT Long Term Goals - 08/11/16 1323      PT LONG TERM GOAL #1   Title patient to be independent with advanced HEP (10/04/16)   Status On-going     PT LONG TERM GOAL #2   Title Patient to report ability to ambulate in community over various surfaces without LOB or pain limiting for >/= 20-30 minutes (10/04/16)   Status On-going     PT LONG TERM GOAL #3   Title Patient to report initiation and maintainence of weekly exercise and walking program (10/04/16)   Status On-going     PT LONG TERM GOAL #4   Title Patient to demosntrate good posture and body mechanics needed for daily activities with ability ot self correct (10/04/16)   Status On-going               Plan - 09/09/16 0177    Clinical Impression Statement Pt. tolerated all seated/standing hip strengthening activities today well with occasional rest breaks  taken to maintain O2 Sats >90%.  Som progression in repetitions with therex today.  Pt. admitting to very limited compliance to HEP secondary to helping with grandchildren while son in law receives cancer treatment.  Will continue to progress per pt. tolerance in coming visits.   PT Treatment/Interventions ADLs/Self Care Home Management;Cryotherapy;Electrical Stimulation;Moist Heat;Traction;Ultrasound;Neuromuscular re-education;Balance training;Therapeutic exercise;Therapeutic activities;Functional mobility training;Patient/family education;Manual techniques;Vasopneumatic Device;Dry needling;Taping   PT Next Visit Plan continue with gentle stretching and strengthening       Patient will benefit from skilled therapeutic intervention in order to improve the following deficits and impairments:  Abnormal gait, Decreased activity tolerance, Decreased balance, Decreased mobility, Decreased strength, Difficulty walking, Pain  Visit Diagnosis: Bilateral low back pain, unspecified chronicity, with sciatica presence unspecified  Difficulty in walking, not elsewhere classified  Abnormal posture  Unsteadiness on feet  Muscle weakness (generalized)     Problem List Patient Active Problem List   Diagnosis Date Noted  . Encounter for Medicare annual wellness exam 05/22/2016  . Hyperglycemia 08/07/2015  . Allergic 08/07/2015  . Gout 08/07/2015  . Leukocytosis 08/07/2015  . Lower abdominal pain 04/21/2015  . Acute bronchitis 02/03/2015  . Back pain 11/02/2014  . Obesity 10/28/2014  . Esophageal reflux 10/28/2014  . Essential hypertension, benign 10/28/2014  . Mixed hyperlipidemia 10/28/2014  . Benign essential tremor 10/28/2014  . Bilateral dry eyes   . Asthma   . Arthritis   . Cancer ([place])   . History of chicken pox     Bess Harvest, [place] 09/09/16 12:18 PM  [facility] [address]  [place], [place], [address] Phone:  [phone]   Fax:  [phone]  Name: Amanda Copeland MRN: [id number] Date of Birth: [birth year]

## 2016-09-12 ENCOUNTER — Ambulatory Visit: Payer: Medicare Other

## 2016-09-12 DIAGNOSIS — M6281 Muscle weakness (generalized): Secondary | ICD-10-CM | POA: Diagnosis not present

## 2016-09-12 DIAGNOSIS — R2681 Unsteadiness on feet: Secondary | ICD-10-CM

## 2016-09-12 DIAGNOSIS — R262 Difficulty in walking, not elsewhere classified: Secondary | ICD-10-CM

## 2016-09-12 DIAGNOSIS — M545 Low back pain: Secondary | ICD-10-CM

## 2016-09-12 DIAGNOSIS — R293 Abnormal posture: Secondary | ICD-10-CM

## 2016-09-12 NOTE — Therapy (Signed)
[facility] [address]  [place], [place], [address] Phone: [phone]   Fax:  [phone]  Physical Therapy Treatment  Patient Details  Name: Amanda Copeland MRN: [id number] Date of Birth: 22-Dec-1940 Referring Provider: Dr. [name]  Encounter Date: 09/12/2016      PT End of Session - 09/12/16 0940    Visit Number 9   Number of Visits 16   Date for PT Re-Evaluation 10/04/16   Authorization Type Medicare   PT Start Time 7062  0934   PT Stop Time 1015   PT Time Calculation (min) 41 min   Activity Tolerance Patient tolerated treatment well   Behavior During Therapy [facility] for tasks assessed/performed      Past Medical History:  Diagnosis Date  . Arthritis   . Asthma    childhood now returning  . Back pain affecting pregnancy 11/02/2014  . Benign essential tremor 10/28/2014  . Bilateral dry eyes   . Cancer (HCC)    Breast  . Dry eyes   . Encounter for Medicare annual wellness exam 05/22/2016  . Essential hypertension, benign 10/28/2014  . H/O measles   . H/O mumps   . History of chicken pox   . Lumbago 11/02/2014  . Mixed hyperlipidemia 10/28/2014  . Obesity 10/28/2014    Past Surgical History:  Procedure Laterality Date  . BACK SURGERY    . BREAST REDUCTION SURGERY Left   . CHOLECYSTECTOMY    . EYE SURGERY  2008   b/l cataracts removed, in [place]  . MASTECTOMY Right   . REPLACEMENT TOTAL KNEE BILATERAL Bilateral   . ROTATOR CUFF REPAIR Left     There were no vitals filed for this visit.      Subjective Assessment - 09/12/16 0936    Subjective Pt. noting only stiff today due to swimming in pool for 2 hours yesterday.     Patient Stated Goals improve balance and use of LE   Currently in Pain? No/denies   Multiple Pain Sites No                         [facility] Adult PT Treatment/Exercise - 09/12/16 3762      Neuro Re-ed    Neuro Re-ed Details  Alternating cone nock over/put  back x 8 reps; 1 ski pole and supervision     Lumbar Exercises: Aerobic   Stationary Bike NuStep: L5 x 6 minutes - BUE/BLE     Lumbar Exercises: Standing   Row Both;15 reps;Strengthening   Theraband Level (Row) Level 3 (Green)     Knee/Hip Exercises: Standing   Heel Raises Both;20 reps   Heel Raises Limitations at counter    Other Standing Knee Exercises Alteranting toe-tap on 9" step with 3# standing on airex pad x 10 reps each      Knee/Hip Exercises: Seated   Hamstring Curl Left;Right;15 reps   Hamstring Limitations green tband    Sit to Sand 2 sets;without UE support;15 reps                  PT Short Term Goals - 09/07/16 0940      PT SHORT TERM GOAL #1   Title patient to be independent with initial HEP (09/06/16)   Status Achieved     PT SHORT TERM GOAL #2   Title Patient to demonstrate AROM of L-spine in all planes WFL and symmetrical without pain limiting (09/06/16)  Status Achieved           PT Long Term Goals - 08/11/16 1323      PT LONG TERM GOAL #1   Title patient to be independent with advanced HEP (10/04/16)   Status On-going     PT LONG TERM GOAL #2   Title Patient to report ability to ambulate in community over various surfaces without LOB or pain limiting for >/= 20-30 minutes (10/04/16)   Status On-going     PT LONG TERM GOAL #3   Title Patient to report initiation and maintainence of weekly exercise and walking program (10/04/16)   Status On-going     PT LONG TERM GOAL #4   Title Patient to demosntrate good posture and body mechanics needed for daily activities with ability ot self correct (10/04/16)   Status On-going               Plan - 09/12/16 1610    Clinical Impression Statement Pt. noting she was able to resume water based exercise at local pool yesterday without issue however "stiff" today from this to start treatment.  Tolerated LE strengthening/endurance activities well today.  Some difficulty with cone nock  over/righting still with instability with conservative single leg balance activities requiring supervision.  Pt. encouraged to begin short distance walking program to improve functional strength and endurance.  Pt. verbalizing plans for daily walks on upcoming cruise.     PT Treatment/Interventions ADLs/Self Care Home Management;Cryotherapy;Electrical Stimulation;Moist Heat;Traction;Ultrasound;Neuromuscular re-education;Balance training;Therapeutic exercise;Therapeutic activities;Functional mobility training;Patient/family education;Manual techniques;Vasopneumatic Device;Dry needling;Taping   PT Next Visit Plan 10th VISIT G-code; FOTO; continue with gentle stretching and strengthening       Patient will benefit from skilled therapeutic intervention in order to improve the following deficits and impairments:  Abnormal gait, Decreased activity tolerance, Decreased balance, Decreased mobility, Decreased strength, Difficulty walking, Pain  Visit Diagnosis: Bilateral low back pain, unspecified chronicity, with sciatica presence unspecified  Difficulty in walking, not elsewhere classified  Abnormal posture  Unsteadiness on feet  Muscle weakness (generalized)     Problem List Patient Active Problem List   Diagnosis Date Noted  . Encounter for Medicare annual wellness exam 05/22/2016  . Hyperglycemia 08/07/2015  . Allergic 08/07/2015  . Gout 08/07/2015  . Leukocytosis 08/07/2015  . Lower abdominal pain 04/21/2015  . Acute bronchitis 02/03/2015  . Back pain 11/02/2014  . Obesity 10/28/2014  . Esophageal reflux 10/28/2014  . Essential hypertension, benign 10/28/2014  . Mixed hyperlipidemia 10/28/2014  . Benign essential tremor 10/28/2014  . Bilateral dry eyes   . Asthma   . Arthritis   . Cancer ([place])   . History of chicken pox     Bess Harvest, [place] 09/12/16 1:31 PM  [facility] [address]  [place], [place],  [address] Phone: [phone]   Fax:  [phone]  Name: Amanda Copeland MRN: [id number] Date of Birth: [birth year]

## 2016-09-14 ENCOUNTER — Ambulatory Visit: Payer: Medicare Other | Admitting: Physical Therapy

## 2016-09-14 DIAGNOSIS — R293 Abnormal posture: Secondary | ICD-10-CM

## 2016-09-14 DIAGNOSIS — R2681 Unsteadiness on feet: Secondary | ICD-10-CM | POA: Diagnosis not present

## 2016-09-14 DIAGNOSIS — M545 Low back pain: Secondary | ICD-10-CM | POA: Diagnosis not present

## 2016-09-14 DIAGNOSIS — M6281 Muscle weakness (generalized): Secondary | ICD-10-CM

## 2016-09-14 DIAGNOSIS — R262 Difficulty in walking, not elsewhere classified: Secondary | ICD-10-CM | POA: Diagnosis not present

## 2016-09-14 NOTE — Therapy (Addendum)
[facility] [address]  [place], [place], [address] Phone: [phone]   Fax:  [phone]  Physical Therapy Treatment  Patient Details  Name: Amanda Copeland MRN: [id number] Date of Birth: 1941-01-22 Referring Provider: Dr. [name]  Encounter Date: 09/14/2016      PT End of Session - 09/14/16 0939    Visit Number 10   Number of Visits 16   Date for PT Re-Evaluation 10/04/16   Authorization Type Medicare   PT Start Time 0937   PT Stop Time 1016   PT Time Calculation (min) 39 min   Activity Tolerance Patient tolerated treatment well   Behavior During Therapy [facility] for tasks assessed/performed      Past Medical History:  Diagnosis Date  . Arthritis   . Asthma    childhood now returning  . Back pain affecting pregnancy 11/02/2014  . Benign essential tremor 10/28/2014  . Bilateral dry eyes   . Cancer (HCC)    Breast  . Dry eyes   . Encounter for Medicare annual wellness exam 05/22/2016  . Essential hypertension, benign 10/28/2014  . H/O measles   . H/O mumps   . History of chicken pox   . Lumbago 11/02/2014  . Mixed hyperlipidemia 10/28/2014  . Obesity 10/28/2014    Past Surgical History:  Procedure Laterality Date  . BACK SURGERY    . BREAST REDUCTION SURGERY Left   . CHOLECYSTECTOMY    . EYE SURGERY  2008   b/l cataracts removed, in [place]  . MASTECTOMY Right   . REPLACEMENT TOTAL KNEE BILATERAL Bilateral   . ROTATOR CUFF REPAIR Left     There were no vitals filed for this visit.      Subjective Assessment - 09/14/16 0938    Subjective Legs feel about the same today - going to pulmonologist on 10/06/16   Pertinent History hx of surgery for "ruptured disc", hx of breast cancer   Patient Stated Goals improve balance and use of LE   Currently in Pain? No/denies   Multiple Pain Sites No                         [facility] Adult PT Treatment/Exercise - 09/14/16 0941      Lumbar  Exercises: Aerobic   Stationary Bike NuStep: L5 x 6 minutes - BUE/BLE     Knee/Hip Exercises: Standing   Forward Lunges Both;15 reps   Forward Lunges Limitations mini - single UE support   Hip Abduction Right;Left;15 reps;Knee straight   Abduction Limitations 3#   Step Down Right;15 reps;Step Height: 4"   Step Down Limitations use of SPC in R UE   Rebounder high knee marching x 15 each LE; ball taps single UE support on rebounder x 15     Knee/Hip Exercises: Seated   Long Arc Quad Right;Left;Weights;15 reps   Long Arc Quad Weight 3 lbs.                  PT Short Term Goals - 09/07/16 0940      PT SHORT TERM GOAL #1   Title patient to be independent with initial HEP (09/06/16)   Status Achieved     PT SHORT TERM GOAL #2   Title Patient to demonstrate AROM of L-spine in all planes WFL and symmetrical without pain limiting (09/06/16)   Status Achieved           PT Long  Term Goals - 08/11/16 1323      PT LONG TERM GOAL #1   Title patient to be independent with advanced HEP (10/04/16)   Status On-going     PT LONG TERM GOAL #2   Title Patient to report ability to ambulate in community over various surfaces without LOB or pain limiting for >/= 20-30 minutes (10/04/16)   Status On-going     PT LONG TERM GOAL #3   Title Patient to report initiation and maintainence of weekly exercise and walking program (10/04/16)   Status On-going     PT LONG TERM GOAL #4   Title Patient to demosntrate good posture and body mechanics needed for daily activities with ability ot self correct (10/04/16)   Status On-going               Plan - 2016/09/23 0940    Clinical Impression Statement Patient excited about upcoming vacation. Does plan to bring [facility] in the event that she may need to ambulate up stairs. PT session primarily focusing on weightbearing activities on both firm and compliant surfaces with good tolerance, however does require some seated rest breaks to allow for  dissapation of any SOB symptoms. Will continue to progress towards goals.    PT Treatment/Interventions ADLs/Self Care Home Management;Cryotherapy;Electrical Stimulation;Moist Heat;Traction;Ultrasound;Neuromuscular re-education;Balance training;Therapeutic exercise;Therapeutic activities;Functional mobility training;Patient/family education;Manual techniques;Vasopneumatic Device;Dry needling;Taping   PT Next Visit Plan continue with gentle stretching and strengthening    Consulted and Agree with Plan of Care Patient      Patient will benefit from skilled therapeutic intervention in order to improve the following deficits and impairments:  Abnormal gait, Decreased activity tolerance, Decreased balance, Decreased mobility, Decreased strength, Difficulty walking, Pain  Visit Diagnosis: Bilateral low back pain, unspecified chronicity, with sciatica presence unspecified  Difficulty in walking, not elsewhere classified  Abnormal posture  Unsteadiness on feet  Muscle weakness (generalized)       G-Codes - 09-23-2016 1042    Functional Assessment Tool Used (Outpatient Only) FOTO: 51 (49% limited)   Functional Limitation Changing and maintaining body position   Mobility: Walking and Moving Around Goal Status (R6045) At least 40 percent but less than 60 percent impaired, limited or restricted   Mobility: Walking and Moving Around Discharge Status [phone]) At least 40 percent but less than 60 percent impaired, limited or restricted      Problem List Patient Active Problem List   Diagnosis Date Noted  . Encounter for Medicare annual wellness exam 05/22/2016  . Hyperglycemia 08/07/2015  . Allergic 08/07/2015  . Gout 08/07/2015  . Leukocytosis 08/07/2015  . Lower abdominal pain 04/21/2015  . Acute bronchitis 02/03/2015  . Back pain 11/02/2014  . Obesity 10/28/2014  . Esophageal reflux 10/28/2014  . Essential hypertension, benign 10/28/2014  . Mixed hyperlipidemia 10/28/2014  . Benign  essential tremor 10/28/2014  . Bilateral dry eyes   . Asthma   . Arthritis   . Cancer ([place])   . History of chicken pox      Lanney Gins, PT, DPT 23-Sep-2016 11:38 AM    PHYSICAL THERAPY DISCHARGE SUMMARY  Visits from Start of Care: 10  Current functional level related to goals / functional outcomes: See above   Remaining deficits: See above - LE weakness limited by SOB within session   Education / Equipment: HEP  Plan: Patient agrees to discharge.  Patient goals were not met. Patient is being discharged due to not returning since the last visit.  ?????  Lanney Gins, PT, DPT 10/14/16 8:33 AM   [facility] [address]  [place], [place], [address] Phone: [phone]   Fax:  [phone]  Name: Alyssamarie Mounsey MRN: [id number] Date of Birth: 1940/09/08

## 2016-10-05 ENCOUNTER — Other Ambulatory Visit: Payer: Self-pay | Admitting: Neurology

## 2016-10-06 ENCOUNTER — Encounter: Payer: Self-pay | Admitting: Internal Medicine

## 2016-10-06 ENCOUNTER — Other Ambulatory Visit (INDEPENDENT_AMBULATORY_CARE_PROVIDER_SITE_OTHER): Payer: Medicare Other

## 2016-10-06 ENCOUNTER — Ambulatory Visit (INDEPENDENT_AMBULATORY_CARE_PROVIDER_SITE_OTHER): Payer: Medicare Other | Admitting: Internal Medicine

## 2016-10-06 VITALS — BP 126/74 | HR 67 | Ht 61.0 in | Wt 247.0 lb

## 2016-10-06 DIAGNOSIS — J45991 Cough variant asthma: Secondary | ICD-10-CM | POA: Diagnosis not present

## 2016-10-06 DIAGNOSIS — R0609 Other forms of dyspnea: Secondary | ICD-10-CM

## 2016-10-06 DIAGNOSIS — J45909 Unspecified asthma, uncomplicated: Secondary | ICD-10-CM | POA: Insufficient documentation

## 2016-10-06 DIAGNOSIS — I10 Essential (primary) hypertension: Secondary | ICD-10-CM

## 2016-10-06 LAB — CBC WITH DIFFERENTIAL/PLATELET
BASOS PCT: 0.8 % (ref 0.0–3.0)
Basophils Absolute: 0.1 10*3/uL (ref 0.0–0.1)
EOS PCT: 2.2 % (ref 0.0–5.0)
Eosinophils Absolute: 0.2 10*3/uL (ref 0.0–0.7)
HEMATOCRIT: 41.2 % (ref 36.0–46.0)
HEMOGLOBIN: 13.7 g/dL (ref 12.0–15.0)
LYMPHS PCT: 18.4 % (ref 12.0–46.0)
Lymphs Abs: 1.6 10*3/uL (ref 0.7–4.0)
MCHC: 33.1 g/dL (ref 30.0–36.0)
MCV: 94.8 fl (ref 78.0–100.0)
MONO ABS: 0.8 10*3/uL (ref 0.1–1.0)
MONOS PCT: 9.5 % (ref 3.0–12.0)
Neutro Abs: 6.1 10*3/uL (ref 1.4–7.7)
Neutrophils Relative %: 69.1 % (ref 43.0–77.0)
Platelets: 364 10*3/uL (ref 150.0–400.0)
RBC: 4.35 Mil/uL (ref 3.87–5.11)
RDW: 15 % (ref 11.5–15.5)
WBC: 8.8 10*3/uL (ref 4.0–10.5)

## 2016-10-06 LAB — BRAIN NATRIURETIC PEPTIDE: Pro B Natriuretic peptide (BNP): 310 pg/mL — ABNORMAL HIGH (ref 0.0–100.0)

## 2016-10-06 MED ORDER — METOPROLOL TARTRATE 25 MG PO TABS: 25.0000 mg | ORAL_TABLET | Freq: Two times a day (BID) | ORAL | 11 refills | Status: DC

## 2016-10-06 NOTE — Progress Notes (Signed)
Subjective:     Patient ID: Amanda Copeland, female   DOB: 12/15/1940,    MRN: [id number]  HPI  1 yowf originally from [place] never smoker dx as asthma age 76 through [place] on prns with tendency to bronchitis pretty much every winter in her 57s s antecedent uri's with progressive doe x 2017 so referred to pulmonary clinic 10/06/2016 by Dr   Nani Ravens.      10/06/2016 1st [facility] office visit/ Airik Goodlin  On inderal 20 bid /saba/pulmocort / prevacid with bfast Chief Complaint  Patient presents with  . Pulmonary Consult    Referred by Dr. Riki Sheer. Pt c/o SOB for the past year, progressively worsening. She states she gets out of breath just walking short distances such as from lobby to our closest exam room today. She is also winded when she first lies down at night. She also c/o non prod cough. She has pulmicort inhaler that she uses occ- maybe 4-5 x per wk on average and then she uses proair 1-2 x per wk on average.   here in [place] x 2016 moved into brand new house on arrival and continued to have "same bronchitis each winter" with onset doe  separate from episodes of  bronchitis with poor control of sob dating back to 11/2015 on inderol 20 mg bid x 4 years [facility] Cough comes and goes mostly during the day / no noct p asleep but also some coughing right at hs mostly dry  Last time did HT hanging on the cart  No better with saba    No obvious day to day or daytime variability or assoc excess/ purulent sputum or mucus plugs or hemoptysis or cp or chest tightness, subjective wheeze or overt sinus or hb symptoms. No unusual exp hx or h/o childhood pna/ asthma or knowledge of premature birth.  Sleeping ok p first coughing at hs though without nocturnal  or early am exacerbation  of respiratory  c/o's or need for noct saba. Also denies any obvious fluctuation of symptoms with weather or environmental changes or other aggravating or alleviating factors except as outlined above   Current  Medications, Allergies, Complete Past Medical History, Past Surgical History, Family History, and Social History were reviewed in [employer] record.  ROS  The following are not active complaints unless bolded sore throat, dysphagia, dental problems, itching, sneezing,  nasal congestion or excess/ purulent secretions, ear ache,   fever, chills, sweats, unintended wt loss, classically pleuritic or exertional cp,  orthopnea pnd or leg swelling, presyncope, palpitations, abdominal pain, anorexia, nausea, vomiting, diarrhea  or change in bowel or bladder habits, change in stools or urine, dysuria,hematuria,  rash, arthralgias, visual complaints, headache, numbness, weakness or ataxia or problems with walking or coordination,  change in mood/affect or memory.              Review of Systems     Objective:   Physical Exam    obese amb wf nad    Wt Readings from Last 3 Encounters:  10/06/16 247 lb (112 kg)  08/22/16 246 lb 9.6 oz (111.9 kg)  05/19/16 241 lb (109.3 kg)    Vital signs reviewed  - Note on arrival 02 sats  93% on RA     HEENT: nl dentition, turbinates bilaterally, and oropharynx. Nl external ear canals without cough reflex   NECK :  without JVD/Nodes/TM/ nl carotid upstrokes bilaterally   LUNGS: no acc muscle use,  Nl contour chest which is clear  to A and P bilaterally without cough on insp or exp maneuvers   CV:  RRR  no s3 or murmur or increase in P2, and no edema   ABD:  Quite obese but soft and nontender with nl inspiratory excursion in the supine position. No bruits or organomegaly appreciated, bowel sounds nl  MS:  Nl gait/ ext warm without deformities, calf tenderness, cyanosis or clubbing No obvious joint restrictions   SKIN: warm and dry without lesions    NEURO:  alert, approp, nl sensorium with  no motor or cerebellar deficits apparent.      I personally reviewed images and agree with radiology impression as follows:  CXR:     08/22/16 Mild to moderate bronchitic changes with mild progression.   Labs ordered/ reviewed:      Chemistry      Component Value Date/Time   NA 139 08/22/2016 1716   K 4.0 08/22/2016 1716   CL 101 08/22/2016 1716   CO2 27 08/22/2016 1716   BUN 17 08/22/2016 1716   CREATININE 0.90 08/22/2016 1716      Component Value Date/Time   CALCIUM 9.7 08/22/2016 1716   ALKPHOS 74 08/22/2016 1716   AST 20 08/22/2016 1716   ALT 13 08/22/2016 1716   BILITOT 0.3 08/22/2016 1716        Lab Results  Component Value Date   WBC 8.8 10/06/2016   HGB 13.7 10/06/2016   HCT 41.2 10/06/2016   MCV 94.8 10/06/2016   PLT 364.0 10/06/2016     Lab Results  Component Value Date   DDIMER 0.79 (H) 10/06/2016      Lab Results  Component Value Date   TSH 2.73 05/19/2016     Lab Results  Component Value Date   PROBNP 310.0 (H) 10/06/2016            Assessment:

## 2016-10-06 NOTE — Patient Instructions (Addendum)
Prevacid 30 mg Take 30- 60 min before your first and last meals of the day    Stop inderal and replace with lopressor 25 mg twice daily   GERD (REFLUX)  is an extremely common cause of respiratory symptoms just like yours , many times with no obvious heartburn at all.    It can be treated with medication, but also with lifestyle changes including elevation of the head of your bed (ideally with 6 inch  bed blocks),  Smoking cessation, avoidance of late meals, excessive alcohol, and avoid fatty foods, chocolate, peppermint, colas, red wine, and acidic juices such as orange juice.  NO MINT OR MENTHOL PRODUCTS SO NO COUGH DROPS   USE SUGARLESS CANDY INSTEAD (Jolley ranchers or Stover's or Life Savers) or even ice chips will also do - the key is to swallow to prevent all throat clearing. NO OIL BASED VITAMINS - use powdered substitutes.  Please schedule a follow up office visit in 6 weeks, call sooner if needed with pfts on return and all active medications in hand

## 2016-10-07 LAB — RESPIRATORY ALLERGY PROFILE REGION II ~~LOC~~
Allergen, Cedar tree, t12: <0.1 kU/L
Allergen, Comm Silver Birch, t9: <0.1 kU/L
Allergen, D pternoyssinus,d7: <0.1 kU/L
Allergen, Mouse Urine Protein, e78: <0.1 kU/L
Allergen, Oak,t7: <0.1 kU/L
Cat Dander: <0.1 kU/L
D. farinae: <0.1 kU/L
Dog Dander: <0.1 kU/L
Elm IgE: <0.1 kU/L
IGE (IMMUNOGLOBULIN E), SERUM: 4 kU/L (ref <115)
Johnson Grass: <0.1 kU/L
Pecan/Hickory Tree IgE: <0.1 kU/L
Sheep Sorrel IgE: <0.1 kU/L

## 2016-10-07 LAB — D-DIMER, QUANTITATIVE (NOT AT ARMC): D DIMER QUANT: 0.79 ug{FEU}/mL — AB (ref <0.50)

## 2016-10-07 NOTE — Assessment & Plan Note (Addendum)
Echo 06/01/16 Left ventricle: The cavity size was normal. There was mild focal   basal hypertrophy of the septum. Systolic function was normal.   The estimated ejection fraction was in the range of 55% to 60%.   Wall motion was normal; there were no regional wall motion   abnormalities. Doppler parameters are consistent with abnormal   left ventricular relaxation (grade 1 diastolic dysfunction). - Mitral valve: There was trivial regurgitation. - Right atrium: The atrium was mildly dilated.   10/06/2016  Walked RA x 3 laps @ 185 ft each stopped due to  End of study, nl pace, no  desat but sob at end  - trial off propranol and max rx for gerd 10/06/2016 >>>   Symptoms are markedly disproportionate to objective findings and not clear this is actually much of a  lung problem but pt does appear to have difficult to sort out respiratory symptoms of unknown origin for which  DDX  = almost all start with A and  include Adherence, Ace Inhibitors, Acid Reflux, Active Sinus Disease, Alpha 1 Antitripsin deficiency, Anxiety masquerading as Airways dz,  ABPA,  Allergy(esp in young), Aspiration (esp in elderly), Adverse effects of meds,  Active smokers, A bunch of PE's (a small clot burden can't cause this syndrome unless there is already severe underlying pulm or vascular dz with poor reserve) Anemia and Thyroid dz plus two Bs  = Bronchiectasis and Beta blocker use..and one C= CHF     Adherence is always the initial "prime suspect" and is a multilayered concern that requires a "trust but verify" approach in every patient - starting with knowing how to use medications, especially inhalers, correctly, keeping up with refills and understanding the fundamental difference between maintenance and prns vs those medications only taken for a very short course and then stopped and not refilled.  - see hfa teaching   ? Acid (or non-acid) GERD > always difficult to exclude as up to 75% of pts in some series report no assoc  GI/ Heartburn symptoms> rec max (24h)  acid suppression and diet restrictions/ reviewed and instructions given in writing.   ? Allergy > check profile / return for pfts before and after off propranolol  ? Anemia/ thyroid dz excluded  ? A bunch of PE's  >  - while a high   nl valute  may miss small peripheral pe, the clot burden with sob is moderately high and the d dimer has a very high neg pred value in this setting    ? BB effects > Strongly prefer in this setting: Bystolic, the most beta -1  selective Beta blocker available in sample form, with bisoprolol the most selective generic choice  on the market.   ? chf > note bnp intermediate, could have diastolic dysfunction related to obesity /hbp    Total time devoted to counseling  > 50 % of initial 60 min office visit:  review case with pt/ discussion of options/alternatives/ personally creating written customized instructions  in presence of pt  then going over those specific  Instructions directly with the pt including how to use all of the meds but in particular covering each new medication in detail and the difference between the maintenance= "automatic" meds and the prns using an action plan format for the latter (If this problem/symptom => do that organization reading Left to right).  Please see AVS from this visit for a full list of these instructions which I personally wrote for this pt and  are unique to this visit.

## 2016-10-07 NOTE — Assessment & Plan Note (Signed)
Trial off propranolol 10/07/2016  - 10/06/2016  After extensive coaching HFA effectiveness =    75%   Will try off pulmocort and just use saba prn then return for full pfts

## 2016-10-08 ENCOUNTER — Other Ambulatory Visit: Payer: Self-pay | Admitting: Family Medicine

## 2016-10-08 DIAGNOSIS — J4521 Mild intermittent asthma with (acute) exacerbation: Secondary | ICD-10-CM

## 2016-10-10 NOTE — Assessment & Plan Note (Signed)
Strongly prefer in this setting: Bystolic, the most beta -1  selective Beta blocker available in sample form, with bisoprolol the most selective generic choice  on the market.   Try lopressor 25 mg bid

## 2016-10-10 NOTE — Assessment & Plan Note (Signed)
Body mass index is 46.67 kg/m.  -  trending up Lab Results  Component Value Date   TSH 2.73 05/19/2016     Contributing to gerd risk/ doe/reviewed the need and the process to achieve and maintain neg calorie balance > defer f/u primary care including intermittently monitoring thyroid status

## 2016-10-11 NOTE — Progress Notes (Signed)
Spoke with pt and notified of results per Dr. Wert. Pt verbalized understanding and denied any questions. 

## 2016-10-11 NOTE — Progress Notes (Signed)
Subjective:   Amanda Copeland was seen in consultation in the movement disorder clinic at the request of Amanda Lukes, MD.  The evaluation is for tremor.  This patient is accompanied in the office by her spouse who supplements the history.   The patient is a 76 y.o. right handed female with a history of tremor.  The patient reports that she has had tremor in both hands for the last several years.  She went to a neurologist in [place] and was diagnosed with ET and was put on propranolol, 20 mg bid and it helped a lot without side effects.  There is a family hx of tremor in her father but he has PD.  There is no other family hx of tremor    Affected by caffeine:  No. (coffee is only decaf) Affected by alcohol:  unknown Affected by stress:  No. Affected by fatigue:  No. Spills soup if on spoon:  No. (not now that she is on the propranolol) Spills glass of liquid if full:  No. Affects ADL's (tying shoes, brushing teeth, etc):  No.  Current/Previously tried tremor medications: propranolol  Current medications that may exacerbate tremor:  Albuterol (doesn't notice that it affects tremor); symbicort  Outside reports reviewed: historical medical records.  10/09/15 update:  Pt returns for f/u re: ET.  The records that were made available to me were reviewed.   Reports that she is doing well on propranolol 20 mg bid.  Rarely she will forget the night pill and "i can tell in the AM."  No SE with medication.  She is under tons of stress.  Her son in law was dx with a bone cancer and he is getting aggressive chemo and needs an amputation.  Her mother in law is dying in [place].  She is helping to take care of the grandchildren.    04/11/16 update:  Patient returns today for follow-up.  She is on propranolol, 20 mg twice per day.  overall, she states that her tremor has been well controlled.  she denies falls.  denies lightheadedness or near syncope.  No new medical problems.  She states that she can tell  if she skips medication.    10/12/16 update:  Patient seen today in follow-up for her essential tremor.  She was on propranolol, 20 mg twice per day but she saw Dr. Melvyn Copeland and states that she was changed to metoprolol instead (to see if her asthma would be better).  She is now on metoprolol 25 mg bid.  She thinks that tremor may be "coming back a little."   Breathing hasn't changed since making the change. I have reviewed records since last visit.  She has been to rehabilitation for low back pain.  She has followed up with Dr. Melvyn Copeland in regards to asthma, as above.   She does ask if she needs all 3 blood pressure meds.  She denies hx of CHF or LE edema.    Allergies  Allergen Reactions  . Amoxicillin Rash  . Ampicillin Rash  . Penicillins Rash  . Statins Other (See Comments)    Per reports muscle aches and joint pain    Outpatient Encounter Prescriptions as of 10/12/2016  Medication Sig  . allopurinol (ZYLOPRIM) 100 MG tablet Take 1 tablet (100 mg total) by mouth daily.  Marland Kitchen aspirin 81 MG tablet Take 81 mg by mouth daily.  Marland Kitchen diltiazem (TIAZAC) 180 MG 24 hr capsule Take 1 capsule (180 mg total) by mouth daily.  Marland Kitchen  metoprolol tartrate (LOPRESSOR) 25 MG tablet Take 1 tablet (25 mg total) by mouth 2 (two) times daily.  . Multiple Vitamins-Minerals (ICAPS AREDS 2) CAPS Take 1 capsule by mouth 2 (two) times daily.  Marland Kitchen PREVACID 30 MG capsule TAKE ONE CAPSULE BY MOUTH TWICE A DAY  . PROAIR HFA 108 (90 Base) MCG/ACT inhaler INHALE 2 PUFFS INTO THE LUNGS EVERY 6 (SIX) HOURS AS NEEDED FOR WHEEZING OR SHORTNESS OF BREATH.  Marland Kitchen PULMICORT FLEXHALER 90 MCG/ACT inhaler INHALE 2 PUFFS INTO THE LUNGS 2 (TWO) TIMES DAILY.  . Red Yeast Rice Extract (RED YEAST RICE PO) Take by mouth daily.  Marland Kitchen triamterene-hydrochlorothiazide (DYAZIDE) 37.5-25 MG capsule Take 1 each (1 capsule total) by mouth daily.   No facility-administered encounter medications on file as of 10/12/2016.     Past Medical History:  Diagnosis Date  .  Arthritis   . Asthma    childhood now returning  . Back pain affecting pregnancy 11/02/2014  . Benign essential tremor 10/28/2014  . Bilateral dry eyes   . Cancer (HCC)    Breast  . Dry eyes   . Encounter for Medicare annual wellness exam 05/22/2016  . Essential hypertension, benign 10/28/2014  . H/O measles   . H/O mumps   . History of chicken pox   . Lumbago 11/02/2014  . Mixed hyperlipidemia 10/28/2014  . Obesity 10/28/2014    Past Surgical History:  Procedure Laterality Date  . BACK SURGERY    . BREAST REDUCTION SURGERY Left   . CHOLECYSTECTOMY    . EYE SURGERY  2008   b/l cataracts removed, in [place]  . MASTECTOMY Right   . REPLACEMENT TOTAL KNEE BILATERAL Bilateral   . ROTATOR CUFF REPAIR Left     Social History   Social History  . Marital status: Married    Spouse name: N/A  . Number of children: N/A  . Years of education: N/A   Occupational History  . 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 Smoking status: Never Smoker  . Smokeless tobacco: Never Used  . Alcohol use 0.0 oz/week     Comment: two times a month  . Drug use: No  . Sexual activity: Not on file     Comment: lives with husband, moved [place], no dietary restrictions, retired Education officer, [employer]   Other Topics Concern  . Not on file   Social History Narrative  . No narrative on file    Family Status  Relation Status  . Mother Deceased       heart disease  . Father Deceased       colon cancer, Parkinson's Disease  . MGM Deceased  . MGF Deceased  . Daughter Alive  . PGM Deceased  . PGF Deceased  . Neg Hx (Not Specified)    Review of Systems SOB with asthma.  A complete 10 system ROS was obtained and was negative apart from what is mentioned.   Objective:   VITALS:   Vitals:   10/12/16 0804  BP: 110/60  Pulse: (!) 56  SpO2: 96%  Weight: 247 lb (112 kg)  Height: 5\' 1"  (1.549 m)   Gen:  Appears stated age and in NAD. HEENT:  Normocephalic, atraumatic.  The mucous membranes are moist. The superficial temporal arteries are without ropiness or tenderness. Cardiovascular: bradycardic.  Regular. Lungs: Clear to auscultation bilaterally. Neck: There are no carotid bruits noted bilaterally.  NEUROLOGICAL:  Orientation:  The patient is alert  and oriented x 3.   Cranial nerves: There is good facial symmetry. Extraocular muscles are intact and visual fields are full to confrontational testing. Speech is fluent and clear. Soft palate rises symmetrically and there is no tongue deviation. Hearing is intact to conversational tone. Tone: Tone is good throughout. Sensation: Sensation is intact to light touch throughout Coordination:  The patient has no dysdiadichokinesia but has some trouble with FNF on the right with eyes closed but not with eyes open Motor: Strength is 5/5 in the bilateral upper and lower extremities.  Shoulder shrug is equal bilaterally.  There is no pronator drift.  There are no fasciculations noted. Gait and Station: The patient is able to ambulate without difficulty.  The patient is slightly wide based  MOVEMENT EXAM: Tremor:  There is no tremor today.  Her archimedes spirals look good today.  Minor tremor of the outstretched hands on the left     Assessment/Plan:   1.  Essential Tremor.  -her pulmonologist just changed her from propranolol to metoprolol.  I don't see a significant change in tremor.  I would leave her on that. 2.  Hx of hypertension  -she asks about being on 3 different BP meds.  I told her to ask her PCP about this.  She isn't following with cardiology any more.   3.  Follow up is anticipated in the next 6-8 months, sooner should new neurologic issues arise.

## 2016-10-12 ENCOUNTER — Ambulatory Visit (INDEPENDENT_AMBULATORY_CARE_PROVIDER_SITE_OTHER): Payer: Medicare Other | Admitting: Neurology

## 2016-10-12 ENCOUNTER — Encounter: Payer: Self-pay | Admitting: Neurology

## 2016-10-12 VITALS — BP 110/60 | HR 56 | Ht 61.0 in | Wt 247.0 lb

## 2016-10-12 DIAGNOSIS — G250 Essential tremor: Secondary | ICD-10-CM | POA: Diagnosis not present

## 2016-10-20 ENCOUNTER — Telehealth: Payer: Self-pay | Admitting: Internal Medicine

## 2016-10-20 ENCOUNTER — Telehealth: Payer: Self-pay | Admitting: Neurology

## 2016-10-20 NOTE — Telephone Encounter (Signed)
Pt is resp symptoms are baseline.  Pt states she has trouble eating and drinking due to shaking. Pt state she hasn't had shakes like this previously.   MW please advise. Thanks.

## 2016-10-20 NOTE — Telephone Encounter (Signed)
Unless the shaking is intolerable on the lopressor I would give it 2 full weeks and ask if there is any change at all in any of the symptoms and if not and can't tolerate the shaking then see how resp symptoms do when back on it

## 2016-10-20 NOTE — Telephone Encounter (Signed)
Patient is having problems with metoprolol.  She said that Dr. Melvyn Novas started her on that so I instructed her to call his office for any changes.

## 2016-10-20 NOTE — Telephone Encounter (Signed)
Called and spoke with pt. Pt states she feels Lopessor is causing her shakes to worsen. Pt states Inderal worked well for her.  MW please advise. Thanks.   10/06/16 AVS 30 mg Take 30- 60 min before your first and last meals of the day    Stop inderal and replace with lopressor 25 mg twice daily   GERD (REFLUX)  is an extremely common cause of respiratory symptoms just like yours , many times with no obvious heartburn at all.    It can be treated with medication, but also with lifestyle changes including elevation of the head of your bed (ideally with 6 inch  bed blocks),  Smoking cessation, avoidance of late meals, excessive alcohol, and avoid fatty foods, chocolate, peppermint, colas, red wine, and acidic juices such as orange juice.  NO MINT OR MENTHOL PRODUCTS SO NO COUGH DROPS   USE SUGARLESS CANDY INSTEAD (Jolley ranchers or Stover's or Life Savers) or even ice chips will also do - the key is to swallow to prevent all throat clearing. NO OIL BASED VITAMINS - use powdered substitutes.  Please schedule a follow up office visit in 6 weeks, call sooner if needed with pfts on return and all active medications in hand

## 2016-10-20 NOTE — Telephone Encounter (Signed)
She does not think the medicine is working and wants to talk to someone about her shaking it is really bad

## 2016-10-21 NOTE — Telephone Encounter (Signed)
As stated, if intolerable then resume propranolol and see if any resp symptoms worsen

## 2016-10-24 NOTE — Telephone Encounter (Signed)
Discussed with pt:  Ok to try  Back on inderal and continue off the inhalers to see if symptoms worsen and if so will need a substitute for inderal per Dr Tat and keep f/u ov s inhalers prior on 11/28/16 as planned

## 2016-10-24 NOTE — Telephone Encounter (Signed)
Spoke with pt. She states that she is still having issues with shaking and shortness of breath. Pt is aware of MW's response and still doesn't understand what she needs to do.  MW - please advise. Thanks.

## 2016-10-27 ENCOUNTER — Ambulatory Visit (INDEPENDENT_AMBULATORY_CARE_PROVIDER_SITE_OTHER): Payer: Medicare Other | Admitting: Family Medicine

## 2016-10-27 ENCOUNTER — Encounter: Payer: Self-pay | Admitting: Family Medicine

## 2016-10-27 VITALS — BP 124/62 | HR 63 | Temp 97.9°F | Resp 18 | Wt 245.6 lb

## 2016-10-27 DIAGNOSIS — J45991 Cough variant asthma: Secondary | ICD-10-CM | POA: Diagnosis not present

## 2016-10-27 DIAGNOSIS — E782 Mixed hyperlipidemia: Secondary | ICD-10-CM

## 2016-10-27 DIAGNOSIS — I10 Essential (primary) hypertension: Secondary | ICD-10-CM

## 2016-10-27 DIAGNOSIS — M109 Gout, unspecified: Secondary | ICD-10-CM | POA: Diagnosis not present

## 2016-10-27 LAB — COMPREHENSIVE METABOLIC PANEL
ALT: 13 U/L (ref 0–35)
AST: 22 U/L (ref 0–37)
Albumin: 4.2 g/dL (ref 3.5–5.2)
Alkaline Phosphatase: 74 U/L (ref 39–117)
BILIRUBIN TOTAL: 0.3 mg/dL (ref 0.2–1.2)
BUN: 18 mg/dL (ref 6–23)
CHLORIDE: 101 meq/L (ref 96–112)
CO2: 25 meq/L (ref 19–32)
CREATININE: 0.84 mg/dL (ref 0.40–1.20)
Calcium: 9.9 mg/dL (ref 8.4–10.5)
GFR: 69.9 mL/min (ref >60.00)
GLUCOSE: 95 mg/dL (ref 70–99)
Potassium: 4.2 mEq/L (ref 3.5–5.1)
Sodium: 138 mEq/L (ref 135–145)
Total Protein: 7.3 g/dL (ref 6.0–8.3)

## 2016-10-27 LAB — CBC
HCT: 43.2 % (ref 36.0–46.0)
Hemoglobin: 14.3 g/dL (ref 12.0–15.0)
MCHC: 33.1 g/dL (ref 30.0–36.0)
MCV: 95.2 fl (ref 78.0–100.0)
Platelets: 372 10*3/uL (ref 150.0–400.0)
RBC: 4.53 Mil/uL (ref 3.87–5.11)
RDW: 14.7 % (ref 11.5–15.5)
WBC: 9.4 10*3/uL (ref 4.0–10.5)

## 2016-10-27 LAB — LDL CHOLESTEROL, DIRECT: LDL DIRECT: 115 mg/dL

## 2016-10-27 LAB — LIPID PANEL
CHOLESTEROL: 213 mg/dL — AB (ref 0–200)
HDL: 66.9 mg/dL (ref >39.00)
NonHDL: 146.39
Total CHOL/HDL Ratio: 3
Triglycerides: 227 mg/dL — ABNORMAL HIGH (ref 0.0–149.0)
VLDL: 45.4 mg/dL — ABNORMAL HIGH (ref 0.0–40.0)

## 2016-10-27 LAB — URIC ACID: Uric Acid, Serum: 5.9 mg/dL (ref 2.4–7.0)

## 2016-10-27 LAB — TSH: TSH: 2.4 u[IU]/mL (ref 0.35–4.50)

## 2016-10-27 MED ORDER — MONTELUKAST SODIUM 10 MG PO TABS: 10.0000 mg | ORAL_TABLET | Freq: Every day | ORAL | 5 refills | Status: DC | PRN

## 2016-10-27 MED ORDER — PROPRANOLOL HCL 20 MG PO TABS: 20.0000 mg | ORAL_TABLET | Freq: Two times a day (BID) | ORAL | 0 refills | Status: DC

## 2016-10-27 NOTE — Progress Notes (Signed)
Subjective:  I acted as a Education administrator for Dr. Charlett Blake. Amanda Copeland, Amanda Copeland  Patient ID: Amanda Copeland, female    DOB: [birth year], 76 y.o.   MRN: [id number]  No chief complaint on file.   HPI  Patient is in today for a 4 month follow up. She notes some trouble with ongoing non productive cough, especially noted when she gets over heated. She does also note some intermittent head congestion. Denies CP/palp/SOB/HA/fevers/GI or GU c/o. Taking meds as prescribed  Patient Care Team: [name], MD as PCP - General (Family Medicine) Druscilla Brownie, MD as Consulting Physician (Dermatology) Jettie Booze, MD as Consulting Physician (Cardiology) Tat, Eustace Quail, DO as Consulting Physician (Neurology) Calvert Cantor, MD as Consulting Physician (Ophthalmology) Tanda Rockers, MD as Consulting Physician (Pulmonary Disease)   Past Medical History:  Diagnosis Date  . Arthritis   . Asthma    childhood now returning  . Back pain affecting pregnancy 11/02/2014  . Benign essential tremor 10/28/2014  . Bilateral dry eyes   . Cancer (HCC)    Breast  . Dry eyes   . Encounter for Medicare annual wellness exam 05/22/2016  . Essential hypertension, benign 10/28/2014  . H/O measles   . H/O mumps   . History of chicken pox   . Lumbago 11/02/2014  . Mixed hyperlipidemia 10/28/2014  . Obesity 10/28/2014    Past Surgical History:  Procedure Laterality Date  . BACK SURGERY    . BREAST REDUCTION SURGERY Left   . CHOLECYSTECTOMY    . EYE SURGERY  2008   b/l cataracts removed, in [place]  . MASTECTOMY Right   . REPLACEMENT TOTAL KNEE BILATERAL Bilateral   . ROTATOR CUFF REPAIR Left     Family History  Problem Relation Age of Onset  . Heart disease Mother   . Heart attack Mother   . Heart failure Mother   . Hypertension Mother   . Cancer Father        colon  . Asthma Father   . Parkinson's disease Father   . Cancer Maternal Grandmother        uterine  . Heart disease Maternal Grandfather     . Obesity Daughter   . Appendicitis Paternal Grandmother   . Stroke Neg Hx     Social History   Social History  . Marital status: Married    Spouse name: N/A  . Number of children: N/A  . Years of education: N/A   Occupational History  . 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 Smoking status: Never Smoker  . Smokeless tobacco: Never Used  . Alcohol use 0.0 oz/week     Comment: two times a month  . Drug use: No  . Sexual activity: Not on file     Comment: lives with husband, moved [place], no dietary restrictions, retired Education officer, museum   Other Topics Concern  . Not on file   Social History Narrative  . No narrative on file    Outpatient Medications Prior to Visit  Medication Sig Dispense Refill  . allopurinol (ZYLOPRIM) 100 MG tablet Take 1 tablet (100 mg total) by mouth daily. 180 tablet 0  . aspirin 81 MG tablet Take 81 mg by mouth daily.    Marland Kitchen diltiazem (TIAZAC) 180 MG 24 hr capsule Take 1 capsule (180 mg total) by mouth daily. 90 capsule 2  . Multiple Vitamins-Minerals (ICAPS AREDS 2) CAPS Take 1 capsule by mouth  2 (two) times daily.    Marland Kitchen PREVACID 30 MG capsule TAKE ONE CAPSULE BY MOUTH TWICE A DAY 60 capsule 6  . PROAIR HFA 108 (90 Base) MCG/ACT inhaler INHALE 2 PUFFS INTO THE LUNGS EVERY 6 (SIX) HOURS AS NEEDED FOR WHEEZING OR SHORTNESS OF BREATH. 8.5 Inhaler 0  . PULMICORT FLEXHALER 90 MCG/ACT inhaler INHALE 2 PUFFS INTO THE LUNGS 2 (TWO) TIMES DAILY. 1 each 1  . Red Yeast Rice Extract (RED YEAST RICE PO) Take by mouth daily.    Marland Kitchen triamterene-hydrochlorothiazide (DYAZIDE) 37.5-25 MG capsule Take 1 each (1 capsule total) by mouth daily. 90 capsule 2  . metoprolol tartrate (LOPRESSOR) 25 MG tablet Take 1 tablet (25 mg total) by mouth 2 (two) times daily. 60 tablet 11   No facility-administered medications prior to visit.     Allergies  Allergen Reactions  . Amoxicillin Rash  . Ampicillin Rash  . Penicillins Rash  . Statins Other (See  Comments)    Per reports muscle aches and joint pain    Review of Systems  Constitutional: Negative for fever and malaise/fatigue.  HENT: Positive for congestion.   Eyes: Negative for blurred vision.  Respiratory: Positive for cough. Negative for shortness of breath.   Cardiovascular: Negative for chest pain, palpitations and leg swelling.  Gastrointestinal: Negative for vomiting.  Musculoskeletal: Negative for back pain.  Skin: Negative for rash.  Neurological: Negative for loss of consciousness and headaches.       Objective:    Physical Exam  Constitutional: She is oriented to person, place, and time. She appears well-developed and well-nourished. No distress.  HENT:  Head: Normocephalic and atraumatic.  Eyes: Conjunctivae are normal.  Neck: Normal range of motion. No thyromegaly present.  Cardiovascular: Normal rate and regular rhythm.   Pulmonary/Chest: Effort normal and breath sounds normal. She has no wheezes.  Abdominal: Soft. Bowel sounds are normal. There is no tenderness.  Musculoskeletal: Normal range of motion. She exhibits no edema or deformity.  Neurological: She is alert and oriented to person, place, and time.  Skin: Skin is warm and dry. She is not diaphoretic.  Psychiatric: She has a normal mood and affect.    BP 124/62 (BP Location: Left Arm, Patient Position: Sitting, Cuff Size: Normal)   Pulse 63   Temp 97.9 F (36.6 C) (Oral)   Resp 18   Wt 245 lb 9.6 oz (111.4 kg)   SpO2 96%   BMI 46.41 kg/m  Wt Readings from Last 3 Encounters:  10/27/16 245 lb 9.6 oz (111.4 kg)  10/12/16 247 lb (112 kg)  10/06/16 247 lb (112 kg)   BP Readings from Last 3 Encounters:  10/27/16 124/62  10/12/16 110/60  10/06/16 126/74     Immunization History  Administered Date(s) Administered  . Influenza, High Dose Seasonal PF 11/09/2015  . Influenza-Unspecified 09/07/2012, 10/09/2014  . Pneumococcal Conjugate-13 11/09/2015  . Td 11/09/2015  . Zoster 02/08/2012      Health Maintenance  Topic Date Due  . INFLUENZA VACCINE  11/07/2016 (Originally 09/07/2016)  . PNA vac Low Risk Adult (2 of 2 - PPSV23) 11/08/2016  . COLONOSCOPY  11/07/2017  . TETANUS/TDAP  11/08/2025  . DEXA SCAN  Completed    Lab Results  Component Value Date   WBC 9.4 10/27/2016   HGB 14.3 10/27/2016   HCT 43.2 10/27/2016   PLT 372.0 10/27/2016   GLUCOSE 95 10/27/2016   CHOL 213 (H) 10/27/2016   TRIG 227.0 (H) 10/27/2016   HDL 66.90 10/27/2016  LDLDIRECT 115.0 10/27/2016   LDLCALC 131 (H) 05/19/2016   ALT 13 10/27/2016   AST 22 10/27/2016   NA 138 10/27/2016   K 4.2 10/27/2016   CL 101 10/27/2016   CREATININE 0.84 10/27/2016   BUN 18 10/27/2016   CO2 25 10/27/2016   TSH 2.40 10/27/2016   HGBA1C 5.8 05/19/2016    Lab Results  Component Value Date   TSH 2.40 10/27/2016   Lab Results  Component Value Date   WBC 9.4 10/27/2016   HGB 14.3 10/27/2016   HCT 43.2 10/27/2016   MCV 95.2 10/27/2016   PLT 372.0 10/27/2016   Lab Results  Component Value Date   NA 138 10/27/2016   K 4.2 10/27/2016   CO2 25 10/27/2016   GLUCOSE 95 10/27/2016   BUN 18 10/27/2016   CREATININE 0.84 10/27/2016   BILITOT 0.3 10/27/2016   ALKPHOS 74 10/27/2016   AST 22 10/27/2016   ALT 13 10/27/2016   PROT 7.3 10/27/2016   ALBUMIN 4.2 10/27/2016   CALCIUM 9.9 10/27/2016   GFR 69.90 10/27/2016   Lab Results  Component Value Date   CHOL 213 (H) 10/27/2016   Lab Results  Component Value Date   HDL 66.90 10/27/2016   Lab Results  Component Value Date   LDLCALC 131 (H) 05/19/2016   Lab Results  Component Value Date   TRIG 227.0 (H) 10/27/2016   Lab Results  Component Value Date   CHOLHDL 3 10/27/2016   Lab Results  Component Value Date   HGBA1C 5.8 05/19/2016         Assessment & Plan:   Problem List Items Addressed This Visit    Morbid obesity due to excess calories (Amanda Copeland)    Encouraged DASH diet, decrease po intake and increase exercise as tolerated. Needs 7-8  hours of sleep nightly. Avoid trans fats, eat small, frequent meals every 4-5 hours with lean proteins, complex carbs and healthy fats. Minimize simple carbs, declines a referral to bariatrics      Essential hypertension, benign    Well controlled, no changes to meds. Encouraged heart healthy diet such as the DASH diet and exercise as tolerated.       Relevant Medications   propranolol (INDERAL) 20 MG tablet   Other Relevant Orders   CBC (Completed)   Comprehensive metabolic panel (Completed)   TSH (Completed)   Mixed hyperlipidemia - Primary   Relevant Medications   propranolol (INDERAL) 20 MG tablet   Other Relevant Orders   Lipid panel (Completed)   Gout    Is taking Allopurinol just 100 mg daily      Relevant Orders   Uric acid (Completed)   Cough variant asthma    Encouraged to try Montelukast daily to see if her cough improves      Relevant Medications   montelukast (SINGULAIR) 10 MG tablet      I have discontinued Amanda Copeland metoprolol tartrate. I am also having her start on montelukast. Additionally, I am having her maintain her Red Yeast Rice Extract (RED YEAST RICE PO), aspirin, diltiazem, triamterene-hydrochlorothiazide, PREVACID, PROAIR HFA, allopurinol, ICAPS AREDS 2, PULMICORT FLEXHALER, and propranolol.  Meds ordered this encounter  Medications  . propranolol (INDERAL) 20 MG tablet    Sig: Take 1 tablet (20 mg total) by mouth 2 (two) times daily.    Dispense:  60 tablet    Refill:  0  . montelukast (SINGULAIR) 10 MG tablet    Sig: Take 1 tablet (10 mg total) by  mouth daily as needed.    Dispense:  30 tablet    Refill:  5    CMA served as scribe during this visit. History, Physical and Plan performed by medical provider. Documentation and orders reviewed and attested to.  [name], MD

## 2016-10-27 NOTE — Assessment & Plan Note (Signed)
Encouraged DASH diet, decrease po intake and increase exercise as tolerated. Needs 7-8 hours of sleep nightly. Avoid trans fats, eat small, frequent meals every 4-5 hours with lean proteins, complex carbs and healthy fats. Minimize simple carbs, declines a referral to bariatrics

## 2016-10-27 NOTE — Patient Instructions (Signed)

## 2016-10-27 NOTE — Assessment & Plan Note (Signed)
Encouraged to try Montelukast daily to see if her cough improves

## 2016-10-27 NOTE — Assessment & Plan Note (Signed)
Well controlled, no changes to meds. Encouraged heart healthy diet such as the DASH diet and exercise as tolerated.  °

## 2016-10-27 NOTE — Assessment & Plan Note (Signed)
Is taking Allopurinol just 100 mg daily

## 2016-10-31 ENCOUNTER — Other Ambulatory Visit: Payer: Self-pay | Admitting: Family Medicine

## 2016-11-05 ENCOUNTER — Other Ambulatory Visit: Payer: Self-pay | Admitting: Family Medicine

## 2016-11-05 DIAGNOSIS — M545 Low back pain: Secondary | ICD-10-CM

## 2016-11-07 NOTE — Telephone Encounter (Signed)
Not on current med list/thx dmf

## 2016-11-28 ENCOUNTER — Ambulatory Visit (INDEPENDENT_AMBULATORY_CARE_PROVIDER_SITE_OTHER): Payer: Medicare Other | Admitting: Internal Medicine

## 2016-11-28 ENCOUNTER — Encounter: Payer: Self-pay | Admitting: Internal Medicine

## 2016-11-28 VITALS — BP 136/70 | HR 67 | Ht 61.0 in | Wt 245.0 lb

## 2016-11-28 DIAGNOSIS — R0609 Other forms of dyspnea: Secondary | ICD-10-CM | POA: Diagnosis not present

## 2016-11-28 DIAGNOSIS — J45991 Cough variant asthma: Secondary | ICD-10-CM

## 2016-11-28 LAB — PULMONARY FUNCTION TEST
DL/VA % pred: 97 %
DL/VA: 4.29 ml/min/mmHg/L
DLCO COR % PRED: 75 %
DLCO COR: 15.3 ml/min/mmHg
DLCO unc % pred: 71 %
DLCO unc: 14.38 ml/min/mmHg
FEF 25-75 POST: 1.31 L/s
FEF 25-75 Pre: 1.21 L/sec
FEF2575-%CHANGE-POST: 7 %
FEF2575-%PRED-PRE: 87 %
FEF2575-%Pred-Post: 94 %
FEV1-%CHANGE-POST: 1 %
FEV1-%Pred-Post: 86 %
FEV1-%Pred-Pre: 85 %
FEV1-POST: 1.54 L
FEV1-Pre: 1.52 L
FEV1FVC-%Change-Post: 0 %
FEV1FVC-%Pred-Pre: 104 %
FEV6-%CHANGE-POST: 1 %
FEV6-%PRED-POST: 88 %
FEV6-%PRED-PRE: 86 %
FEV6-POST: 1.98 L
FEV6-PRE: 1.96 L
FEV6FVC-%Pred-Post: 105 %
FEV6FVC-%Pred-Pre: 105 %
FVC-%CHANGE-POST: 1 %
FVC-%PRED-POST: 83 %
FVC-%Pred-Pre: 82 %
FVC-POST: 1.98 L
FVC-Pre: 1.96 L
POST FEV6/FVC RATIO: 100 %
Post FEV1/FVC ratio: 77 %
Pre FEV1/FVC ratio: 77 %
Pre FEV6/FVC Ratio: 100 %
RV % pred: 92 %
RV: 2.02 L
TLC % PRED: 92 %
TLC: 4.24 L

## 2016-11-28 MED ORDER — GABAPENTIN 100 MG PO CAPS: 100.0000 mg | ORAL_CAPSULE | Freq: Three times a day (TID) | ORAL | 2 refills | Status: DC

## 2016-11-28 NOTE — Progress Notes (Signed)
Subjective:     Patient ID: Amanda Copeland, female   DOB: 10-27-1940,    MRN: [id number]    Brief patient profile:  27 yowf originally from [place] never smoker dx as asthma age 76 through HS on prns with tendency to bronchitis pretty much every winter in her 35s s antecedent uri's with progressive doe x 2017 so referred to pulmonary clinic 10/06/2016 by Dr   Nani Ravens.     History of Present Illness  10/06/2016 1st [facility] office visit/ [facility]  On inderal 20 bid /saba/pulmocort / prevacid with bfast Chief Complaint  Patient presents with  . Pulmonary Consult    Referred by Dr. Riki Sheer. Pt c/o SOB for the past year, progressively worsening. She states she gets out of breath just walking short distances such as from lobby to our closest exam room today. She is also winded when she first lies down at night. She also c/o non prod cough. She has pulmicort inhaler that she uses occ- maybe 4-5 x per wk on average and then she uses proair 1-2 x per wk on average.   here in [place] x 2016 moved into brand new house on arrival and continued to have "same bronchitis each winter" with onset doe  separate from episodes of  bronchitis with poor control of sob dating back to 11/2015 on inderol 20 mg bid x 4 years [facility] Cough comes and goes mostly during the day / no noct p asleep but also some coughing right at hs mostly dry  Last time did HT hanging on the cart  No better with saba  rec Prevacid 30 mg Take 30- 60 min before your first and last meals of the day   Stop inderal and replace with lopressor 25 mg twice daily  GERD  Diet     11/28/2016  f/u ov/[facility] re: unexplained sob /cough x one year  Chief Complaint  Patient presents with  . Follow-up    PFT's done today. Her breathing is unchanged. She has not used her albuterol.   still coughing most days totally sporadic but not p hs  Doe = MMRC2 = can't walk a nl pace on a flat grade s sob but does fine slow and flat eg HT  Tried  pulmocort x sev weeks no better Off albeterol no worse, back on inderal due to uncontrolled tremor > tremor better,cough no worse  Using fish oil daily though included in trial gerd diet to eliminate    No obvious patterns in day to day or daytime variability or assoc excess/ purulent sputum or mucus plugs or hemoptysis or cp or chest tightness, subjective wheeze or overt sinus or hb symptoms. No unusual exp hx or h/o childhood pna/ asthma or knowledge of premature birth.  Sleeping ok flat without nocturnal  or early am exacerbation  of respiratory  c/o's or need for noct saba. Also denies any obvious fluctuation of symptoms with weather or environmental changes or other aggravating or alleviating factors except as outlined above   Current Allergies, Complete Past Medical History, Past Surgical History, Family History, and Social History were reviewed in [employer] record.  ROS  The following are not active complaints unless bolded Hoarseness, sore throat, dysphagia, dental problems, itching, sneezing,  nasal congestion or discharge of excess mucus or purulent secretions, ear ache,   fever, chills, sweats, unintended wt loss or wt gain, classically pleuritic or exertional cp,  orthopnea pnd or leg swelling, presyncope, palpitations, abdominal pain, anorexia,  nausea, vomiting, diarrhea  or change in bowel habits or change in bladder habits, change in stools or change in urine, dysuria, hematuria,  rash, arthralgias, visual complaints, headache, numbness, weakness or ataxia or problems with walking or coordination,  change in mood/affect or memory.        Current Meds  Medication Sig  . allopurinol (ZYLOPRIM) 100 MG tablet Take 1 tablet (100 mg total) by mouth daily.  Marland Kitchen aspirin 81 MG tablet Take 81 mg by mouth daily.  Marland Kitchen diltiazem (TIAZAC) 180 MG 24 hr capsule Take 1 capsule (180 mg total) by mouth daily.  . Multiple Vitamins-Minerals (ICAPS AREDS 2) CAPS Take 1 capsule by  mouth 2 (two) times daily.  Marland Kitchen PREVACID 30 MG capsule TAKE ONE CAPSULE BY MOUTH TWICE A DAY  . propranolol (INDERAL) 20 MG tablet Take 1 tablet (20 mg total) by mouth 2 (two) times daily.  . Red Yeast Rice Extract (RED YEAST RICE PO) Take by mouth daily.  Marland Kitchen triamterene-hydrochlorothiazide (DYAZIDE) 37.5-25 MG capsule Take 1 each (1 capsule total) by mouth daily.  .  montelukast (SINGULAIR) 10 MG tablet Take 1 tablet (10 mg total) by mouth daily as needed.  .   Omega-3 Fatty Acids (FISH OIL PO) Take 1 capsule by mouth daily.  . [DISCONTINUED] PROAIR HFA 108 (90 Base) MCG/ACT inhaler INHALE 2 PUFFS INTO THE LUNGS EVERY 6 (SIX) HOURS AS NEEDED FOR WHEEZING OR SHORTNESS OF BREATH.                Objective:   Physical Exam    obese amb wf nad    11/28/2016     245    10/06/16 247 lb (112 kg)  08/22/16 246 lb 9.6 oz (111.9 kg)  05/19/16 241 lb (109.3 kg)    Vital signs reviewed  - Note on arrival 02 sats  97% on RA     HEENT: nl dentition, turbinates bilaterally, and oropharynx. Nl external ear canals without cough reflex   NECK :  without JVD/Nodes/TM/ nl carotid upstrokes bilaterally   LUNGS: no acc muscle use,  Nl contour chest which is clear to A and P bilaterally without cough on insp or exp maneuvers   CV:  RRR  no s3 or murmur or increase in P2, and no edema   ABD:  Quite obese but soft and nontender with nl inspiratory excursion in the supine position. No bruits or organomegaly appreciated, bowel sounds nl  MS:  Nl gait/ ext warm without deformities, calf tenderness, cyanosis or clubbing No obvious joint restrictions   SKIN: warm and dry without lesions    NEURO:  alert, approp, nl sensorium with  no motor or cerebellar deficits apparent.                Assessment:

## 2016-11-28 NOTE — Progress Notes (Signed)
Cardiology Office Note   Date:  11/29/2016   ID:  Amanda Copeland, DOB February 17, 1940, MRN [id number]  PCP:  [name], MD    No chief complaint on file.    Wt Readings from Last 3 Encounters:  11/29/16 240 lb 3.2 oz (109 kg)  11/28/16 245 lb (111.1 kg)  10/27/16 245 lb 9.6 oz (111.4 kg)       History of Present Illness: Amanda Copeland is a 76 y.o. female  Who has RF for CAD.  SHe has had a stress test several years ago that was normal.  She is intolerant to statins to muscle pain and fatigue.  SHe took atorvastatin.  She also tried another, maybe pravastatin but did not tolerate this.   It was recommended that she follow the DASH diet for weight loss.   Since the last visit, she saw pulmonary.  She was told her lungs are ok.  She was exercising in the summer.    She has not been exercising since the weather has gotten colder.    Denies : Chest pain. Dizziness. Leg edema. Nitroglycerin use. Orthopnea. Palpitations. Paroxysmal nocturnal dyspnea.  Syncope.   Still having DOE.  Difficulty losing weight.       Past Medical History:  Diagnosis Date  . Arthritis   . Asthma    childhood now returning  . Back pain affecting pregnancy 11/02/2014  . Benign essential tremor 10/28/2014  . Bilateral dry eyes   . Cancer (HCC)    Breast  . Dry eyes   . Encounter for Medicare annual wellness exam 05/22/2016  . Essential hypertension, benign 10/28/2014  . H/O measles   . H/O mumps   . History of chicken pox   . Lumbago 11/02/2014  . Mixed hyperlipidemia 10/28/2014  . Obesity 10/28/2014    Past Surgical History:  Procedure Laterality Date  . BACK SURGERY    . BREAST REDUCTION SURGERY Left   . CHOLECYSTECTOMY    . EYE SURGERY  2008   b/l cataracts removed, in [place]  . MASTECTOMY Right   . REPLACEMENT TOTAL KNEE BILATERAL Bilateral   . ROTATOR CUFF REPAIR Left      Current Outpatient Prescriptions  Medication Sig Dispense Refill  . allopurinol (ZYLOPRIM) 100 MG  tablet Take 1 tablet (100 mg total) by mouth daily. 180 tablet 0  . aspirin 81 MG tablet Take 81 mg by mouth daily.    Marland Kitchen diltiazem (TIAZAC) 180 MG 24 hr capsule Take 1 capsule (180 mg total) by mouth daily. 90 capsule 2  . gabapentin (NEURONTIN) 100 MG capsule Take 1 capsule (100 mg total) by mouth 3 (three) times daily. One three times daily 90 capsule 2  . Multiple Vitamins-Minerals (ICAPS AREDS 2) CAPS Take 1 capsule by mouth 2 (two) times daily.    Marland Kitchen PREVACID 30 MG capsule TAKE ONE CAPSULE BY MOUTH TWICE A DAY 60 capsule 6  . propranolol (INDERAL) 20 MG tablet Take 1 tablet (20 mg total) by mouth 2 (two) times daily. 60 tablet 0  . Red Yeast Rice Extract (RED YEAST RICE PO) Take by mouth daily.    Marland Kitchen triamterene-hydrochlorothiazide (DYAZIDE) 37.5-25 MG capsule Take 1 each (1 capsule total) by mouth daily. 90 capsule 2   No current facility-administered medications for this visit.     Allergies:   Amoxicillin; Ampicillin; Penicillins; and Statins    Social History:  The patient  reports that she has never smoked. She has never used smokeless  tobacco. She reports that she drinks alcohol. She reports that she does not use drugs.   Family History:  The patient's family history includes Appendicitis in her paternal grandmother; Asthma in her father; Cancer in her father and maternal grandmother; Heart attack in her mother; Heart disease in her maternal grandfather and mother; Heart failure in her mother; Hypertension in her mother; Obesity in her daughter; Parkinson's disease in her father.    ROS:  Please see the history of present illness.   Otherwise, review of systems are positive for DOE.   All other systems are reviewed and negative.    PHYSICAL EXAM: VS:  BP 120/78   Pulse 80   Ht 5\' 1"  (1.549 m)   Wt 240 lb 3.2 oz (109 kg)   SpO2 91%   BMI 45.39 kg/m  , BMI Body mass index is 45.39 kg/m. GEN: Well nourished, well developed, in no acute distress  HEENT: normal  Neck: no JVD,  carotid bruits, or masses Cardiac: RRR; no murmurs, rubs, or gallops,no edema  Respiratory:  clear to auscultation bilaterally, normal work of breathing GI: soft, nontender, nondistended, + BS MS: no deformity or atrophy  Skin: warm and dry, no rash Neuro:  Strength and sensation are intact Psych: euthymic mood, full affect   EKG:   The ekg ordered in 7/18 demonstrates NSR, no ST segment changes   Recent Labs: 10/06/2016: Pro B Natriuretic peptide (BNP) 310.0 10/27/2016: ALT 13; BUN 18; Creatinine, Ser 0.84; Hemoglobin 14.3; Platelets 372.0; Potassium 4.2; Sodium 138; TSH 2.40   Lipid Panel    Component Value Date/Time   CHOL 213 (H) 10/27/2016 1145   TRIG 227.0 (H) 10/27/2016 1145   HDL 66.90 10/27/2016 1145   CHOLHDL 3 10/27/2016 1145   VLDL 45.4 (H) 10/27/2016 1145   LDLCALC 131 (H) 05/19/2016 1200   LDLDIRECT 115.0 10/27/2016 1145     Other studies Reviewed: Additional studies/ records that were reviewed today with results demonstrating: lipids reviewed; echo in 2018 showed normal LV function and impaired relaxation.   ASSESSMENT AND PLAN:  1. Chronic diastolic failure: Change diuretic to Lasix 40 , with potasssium  m 20 mEq daily.  Stop dyazide.  BMet in a few weeks.  BNP was elevated and better diuresis may help breathing. 2. Hypertension: COntrolled 3. Hypercholesterolemia: LDL improved in 9/18. Continue current lipid lowering therapy.  Diet and better exercise will help.   Current medicines are reviewed at length with the patient today.  The patient concerns regarding her medicines were addressed.  The following changes have been made:  No change  Labs/ tests ordered today include:  No orders of the defined types were placed in this encounter.   Recommend 150 minutes/week of aerobic exercise Low fat, low carb, high fiber diet recommended  Disposition:   FU in 1 year   Signed, Larae Grooms, MD  11/29/2016 9:53 AM    [facility]  [facility], [place], [place]  [address] Phone: [phone]; Fax: [phone]

## 2016-11-28 NOTE — Patient Instructions (Addendum)
GERD (REFLUX)  is an extremely common cause of respiratory symptoms just like yours , many times with no obvious heartburn at all.    It can be treated with medication, but also with lifestyle changes including elevation of the head of your bed (ideally with 6 inch  bed blocks),  Smoking cessation, avoidance of late meals, excessive alcohol, and avoid fatty foods, chocolate, peppermint, colas, red wine, and acidic juices such as orange juice.  NO MINT OR MENTHOL PRODUCTS SO NO COUGH DROPS   USE SUGARLESS CANDY INSTEAD (Jolley ranchers or Stover's or Life Savers) or even ice chips will also do - the key is to swallow to prevent all throat clearing. NO OIL BASED VITAMINS - use powdered substitutes   Gabapentin 100 mg three times a day and stop singulair now   Please schedule a follow up office visit in 6 weeks, sooner if needed

## 2016-11-28 NOTE — Progress Notes (Signed)
PFT completed today 11/28/16.

## 2016-11-29 ENCOUNTER — Encounter: Payer: Self-pay | Admitting: Internal Medicine

## 2016-11-29 ENCOUNTER — Ambulatory Visit (INDEPENDENT_AMBULATORY_CARE_PROVIDER_SITE_OTHER): Payer: Medicare Other | Admitting: Interventional Cardiology

## 2016-11-29 ENCOUNTER — Encounter: Payer: Self-pay | Admitting: Interventional Cardiology

## 2016-11-29 VITALS — BP 120/78 | HR 80 | Ht 61.0 in | Wt 240.2 lb

## 2016-11-29 DIAGNOSIS — I10 Essential (primary) hypertension: Secondary | ICD-10-CM

## 2016-11-29 DIAGNOSIS — E782 Mixed hyperlipidemia: Secondary | ICD-10-CM

## 2016-11-29 DIAGNOSIS — I5032 Chronic diastolic (congestive) heart failure: Secondary | ICD-10-CM | POA: Diagnosis not present

## 2016-11-29 MED ORDER — POTASSIUM CHLORIDE ER 20 MEQ PO TBCR: 20.0000 meq | EXTENDED_RELEASE_TABLET | Freq: Every day | ORAL | 3 refills | Status: DC

## 2016-11-29 MED ORDER — FUROSEMIDE 40 MG PO TABS: 40.0000 mg | ORAL_TABLET | Freq: Every day | ORAL | 3 refills | Status: DC

## 2016-11-29 NOTE — Assessment & Plan Note (Signed)
Body mass index is 46.29 kg/m.  -  trending down slightly Lab Results  Component Value Date   TSH 2.40 10/27/2016     Contributing to gerd risk/ doe/reviewed the need and the process to achieve and maintain neg calorie balance > defer f/u primary care including intermittently monitoring thyroid status

## 2016-11-29 NOTE — Assessment & Plan Note (Signed)
Echo 06/01/16 Left ventricle: The cavity size was normal. There was mild focal   basal hypertrophy of the septum. Systolic function was normal.   The estimated ejection fraction was in the range of 55% to 60%.   Wall motion was normal; there were no regional wall motion   abnormalities. Doppler parameters are consistent with abnormal   left ventricular relaxation (grade 1 diastolic dysfunction). - Mitral valve: There was trivial regurgitation. - Right atrium: The atrium was mildly dilated.  10/06/2016  Walked RA x 3 laps @ 185 ft each stopped due to  End of study, nl pace, no  desat but sob at end  - trial off propranol and max rx for gerd 10/06/2016 >>> no better - PFT's 11/28/2016 wnl x for min non-specific exp curvature   No explanation for her doe/ next step would be CPST if not improving with regular sub max exercise

## 2016-11-29 NOTE — Assessment & Plan Note (Addendum)
Allergy profile  10/06/16  >  Eos 0.2 /  IgE  4  RAST neg  Trial off propranolol 10/06/16 > no change  - 10/06/2016  After extensive coaching HFA effectiveness =    75%  - PFT's  11/28/2016  FEV1 1.54 (86 % ) ratio 77  p no % improvement from saba p nothing prior to study with DLCO  71/75  % corrects to 97  % for alv volume with mild exp curvature    The lack of response to changing from propranolol and pfts while on it are strongly suggestive this is not cough variant asthma but more likely Upper airway cough syndrome (previously labeled PNDS),  is so named because it's frequently impossible to sort out how much is  CR/sinusitis with freq throat clearing (which can be related to primary GERD)   vs  causing  secondary (" extra esophageal")  GERD from wide swings in gastric pressure that occur with throat clearing, often  promoting self use of mint and menthol lozenges that reduce the lower esophageal sphincter tone and exacerbate the problem further in a cyclical fashion.   These are the same pts (now being labeled as having "irritable larynx syndrome" by some cough centers) who not infrequently have a history of having failed to tolerate ace inhibitors,  dry powder inhalers or biphosphonates or report having atypical/extraesophageal reflux symptoms that don't respond to standard doses of PPI  and are easily confused as having aecopd or asthma flares by even experienced allergists/ pulmonologists (myself included).    rec trial of gabapentin next at 100 tid    I had an extended discussion with the patient reviewing all relevant studies completed to date and  lasting 15 to 20 minutes of a 25 minute visit    Each maintenance medication was reviewed in detail including most importantly the difference between maintenance and prns and under what circumstances the prns are to be triggered using an action plan format that is not reflected in the computer generated alphabetically organized AVS.    Please see  AVS for specific instructions unique to this visit that I personally wrote and verbalized to the the pt in detail and then reviewed with pt  by my nurse highlighting any  changes in therapy recommended at today's visit to their plan of care.

## 2016-11-29 NOTE — Patient Instructions (Addendum)
Medication Instructions:  Your physician has recommended you make the following change in your medication:   1. STOP triamterene-HCTZ (dyazide)  2. START furosemide (lasix) 40 mg (1 tablet) daily  3. START potassium 20 mEq (1 tablet) daily  Labwork: Your physician recommends that you have your labs drawn in 1-2 weeks at [facility]: BMET and BNP. Prescription given   Testing/Procedures: None ordered  Follow-Up: Your physician wants you to follow-up in: 1 year with Dr. Irish Lack. You will receive a reminder letter in the mail two months in advance. If you don't receive a letter, please call our office to schedule the follow-up appointment.   Any Other Special Instructions Will Be Listed Below (If Applicable).   Heart-Healthy Eating Plan Heart-healthy meal planning includes:  Limiting unhealthy fats.  Increasing healthy fats.  Making other small dietary changes.  You may need to talk with your doctor or a diet specialist (dietitian) to create an eating plan that is right for you. What types of fat should I choose?  Choose healthy fats. These include olive oil and canola oil, flaxseeds, walnuts, almonds, and seeds.  Eat more omega-3 fats. These include salmon, mackerel, sardines, tuna, flaxseed oil, and ground flaxseeds. Try to eat fish at least twice each week.  Limit saturated fats. ? Saturated fats are often found in animal products, such as meats, butter, and cream. ? Plant sources of saturated fats include palm oil, palm kernel oil, and coconut oil.  Avoid foods with partially hydrogenated oils in them. These include stick margarine, some tub margarines, cookies, crackers, and other baked goods. These contain trans fats. What general guidelines do I need to follow?  Check food labels carefully. Identify foods with trans fats or high amounts of saturated fat.  Fill one half of your plate with vegetables and green salads. Eat 4-5 servings of vegetables per  day. A serving of vegetables is: ? 1 cup of raw leafy vegetables. ?  cup of raw or cooked cut-up vegetables. ?  cup of vegetable juice.  Fill one fourth of your plate with whole grains. Look for the word "whole" as the first word in the ingredient list.  Fill one fourth of your plate with lean protein foods.  Eat 4-5 servings of fruit per day. A serving of fruit is: ? One medium whole fruit. ?  cup of dried fruit. ?  cup of fresh, frozen, or canned fruit. ?  cup of 100% fruit juice.  Eat more foods that contain soluble fiber. These include apples, broccoli, carrots, beans, peas, and barley. Try to get 20-30 g of fiber per day.  Eat more home-cooked food. Eat less restaurant, buffet, and fast food.  Limit or avoid alcohol.  Limit foods high in starch and sugar.  Avoid fried foods.  Avoid frying your food. Try baking, boiling, grilling, or broiling it instead. You can also reduce fat by: ? Removing the skin from poultry. ? Removing all visible fats from meats. ? Skimming the fat off of stews, soups, and gravies before serving them. ? Steaming vegetables in water or broth.  Lose weight if you are overweight.  Eat 4-5 servings of nuts, legumes, and seeds per week: ? One serving of dried beans or legumes equals  cup after being cooked. ? One serving of nuts equals 1 ounces. ? One serving of seeds equals  ounce or one tablespoon.  You may need to keep track of how much salt or sodium you eat. This is especially true  if you have high blood pressure. Talk with your doctor or dietitian to get more information. What foods can I eat? Grains Breads, including [place], white, pita, wheat, raisin, rye, oatmeal, and [place]. Tortillas that are neither fried nor made with lard or trans fat. Low-fat rolls, including hotdog and hamburger buns and English muffins. Biscuits. Muffins. Waffles. Pancakes. Light popcorn. Whole-grain cereals. Flatbread. Melba toast. Pretzels. Breadsticks.  Rusks. Low-fat snacks. Low-fat crackers, including oyster, saltine, matzo, graham, animal, and rye. Rice and pasta, including brown rice and pastas that are made with whole wheat. Vegetables All vegetables. Fruits All fruits, but limit coconut. Meats and Other Protein Sources Lean, well-trimmed beef, veal, pork, and lamb. Chicken and [place] without skin. All fish and shellfish. Wild duck, rabbit, pheasant, and venison. Egg whites or low-cholesterol egg substitutes. Dried beans, peas, lentils, and tofu. Seeds and most nuts. Dairy Low-fat or nonfat cheeses, including ricotta, string, and mozzarella. Skim or 1% milk that is liquid, powdered, or evaporated. Buttermilk that is made with low-fat milk. Nonfat or low-fat yogurt. Beverages Mineral water. Diet carbonated beverages. Sweets and Desserts Sherbets and fruit ices. Honey, jam, marmalade, jelly, and syrups. Meringues and gelatins. Pure sugar candy, such as hard candy, jelly beans, gumdrops, mints, marshmallows, and small amounts of dark chocolate. [employer] Eat all sweets and desserts in moderation. Fats and Oils Nonhydrogenated (trans-free) margarines. Vegetable oils, including soybean, sesame, sunflower, olive, peanut, safflower, corn, canola, and cottonseed. Salad dressings or mayonnaise made with a vegetable oil. Limit added fats and oils that you use for cooking, baking, salads, and as spreads. Other Cocoa powder. Coffee and tea. All seasonings and condiments. The items listed above may not be a complete list of recommended foods or beverages. Contact your dietitian for more options. What foods are not recommended? Grains Breads that are made with saturated or trans fats, oils, or whole milk. Croissants. Butter rolls. Cheese breads. Sweet rolls. Donuts. Buttered popcorn. Chow mein noodles. High-fat crackers, such as cheese or butter crackers. Meats and Other Protein Sources Fatty meats, such as hotdogs, short ribs, sausage,  spareribs, bacon, rib eye roast or steak, and mutton. High-fat deli meats, such as salami and bologna. Caviar. Domestic duck and goose. Organ meats, such as kidney, liver, sweetbreads, and heart. Dairy Cream, sour cream, cream cheese, and creamed cottage cheese. Whole-milk cheeses, including blue (bleu), Monterey Jack, [place], [place], American, [place], Swiss, cheddar, [place], and [place]. Whole or 2% milk that is liquid, evaporated, or condensed. Whole buttermilk. Cream sauce or high-fat cheese sauce. Yogurt that is made from whole milk. Beverages Regular sodas and juice drinks with added sugar. Sweets and Desserts Frosting. Pudding. Cookies. Cakes other than angel food cake. Candy that has milk chocolate or white chocolate, hydrogenated fat, butter, coconut, or unknown ingredients. Buttered syrups. Full-fat ice cream or ice cream drinks. Fats and Oils Gravy that has suet, meat fat, or shortening. Cocoa butter, hydrogenated oils, palm oil, coconut oil, palm kernel oil. These can often be found in baked products, candy, fried foods, nondairy creamers, and whipped toppings. Solid fats and shortenings, including bacon fat, salt pork, lard, and butter. Nondairy cream substitutes, such as coffee creamers and sour cream substitutes. Salad dressings that are made of unknown oils, cheese, or sour cream. The items listed above may not be a complete list of foods and beverages to avoid. Contact your dietitian for more information. This information is not intended to replace advice given to you by your health care provider. Make sure you discuss any  questions you have with your health care provider. Document Released: 07/26/2011 Document Revised: 07/02/2015 Document Reviewed: 07/18/2013 Elsevier Interactive Patient Education  [employer].    If you need a refill on your cardiac medications before your next appointment, please call your pharmacy.

## 2016-11-30 ENCOUNTER — Other Ambulatory Visit: Payer: Self-pay | Admitting: Family Medicine

## 2016-12-06 ENCOUNTER — Telehealth: Payer: Self-pay | Admitting: *Deleted

## 2016-12-06 NOTE — Telephone Encounter (Signed)
Received Physician Orders from [facility]; forwarded to provider/SLS 10/30

## 2016-12-12 ENCOUNTER — Other Ambulatory Visit: Payer: Self-pay | Admitting: Family Medicine

## 2016-12-13 DIAGNOSIS — H01002 Unspecified blepharitis right lower eyelid: Secondary | ICD-10-CM | POA: Diagnosis not present

## 2016-12-13 DIAGNOSIS — H01004 Unspecified blepharitis left upper eyelid: Secondary | ICD-10-CM | POA: Diagnosis not present

## 2016-12-13 DIAGNOSIS — H04123 Dry eye syndrome of bilateral lacrimal glands: Secondary | ICD-10-CM | POA: Diagnosis not present

## 2016-12-13 DIAGNOSIS — H01001 Unspecified blepharitis right upper eyelid: Secondary | ICD-10-CM | POA: Diagnosis not present

## 2016-12-13 DIAGNOSIS — Z961 Presence of intraocular lens: Secondary | ICD-10-CM | POA: Diagnosis not present

## 2016-12-13 DIAGNOSIS — H01005 Unspecified blepharitis left lower eyelid: Secondary | ICD-10-CM | POA: Diagnosis not present

## 2016-12-23 ENCOUNTER — Other Ambulatory Visit: Payer: Self-pay | Admitting: *Deleted

## 2016-12-23 ENCOUNTER — Other Ambulatory Visit: Payer: Medicare Other | Admitting: *Deleted

## 2016-12-23 DIAGNOSIS — Z79899 Other long term (current) drug therapy: Secondary | ICD-10-CM

## 2016-12-23 DIAGNOSIS — I10 Essential (primary) hypertension: Secondary | ICD-10-CM

## 2016-12-23 DIAGNOSIS — R0602 Shortness of breath: Secondary | ICD-10-CM

## 2016-12-23 DIAGNOSIS — I5032 Chronic diastolic (congestive) heart failure: Secondary | ICD-10-CM

## 2016-12-24 LAB — BASIC METABOLIC PANEL
BUN/Creatinine Ratio: 16 (ref 12–28)
BUN: 14 mg/dL (ref 8–27)
CALCIUM: 9.5 mg/dL (ref 8.7–10.3)
CO2: 26 mmol/L (ref 20–29)
Chloride: 104 mmol/L (ref 96–106)
Creatinine, Ser: 0.89 mg/dL (ref 0.57–1.00)
GFR, EST AFRICAN AMERICAN: 72 mL/min/{1.73_m2} (ref >59)
GFR, EST NON AFRICAN AMERICAN: 63 mL/min/{1.73_m2} (ref >59)
Glucose: 95 mg/dL (ref 65–99)
Potassium: 4.3 mmol/L (ref 3.5–5.2)
Sodium: 145 mmol/L — ABNORMAL HIGH (ref 134–144)

## 2016-12-24 LAB — PRO B NATRIURETIC PEPTIDE: NT-Pro BNP: 408 pg/mL (ref 0–738)

## 2016-12-26 ENCOUNTER — Telehealth: Payer: Self-pay

## 2016-12-26 MED ORDER — FUROSEMIDE 40 MG PO TABS: 40.0000 mg | ORAL_TABLET | Freq: Every day | ORAL | 3 refills | Status: DC

## 2016-12-26 MED ORDER — POTASSIUM CHLORIDE ER 20 MEQ PO TBCR: 20.0000 meq | EXTENDED_RELEASE_TABLET | Freq: Every day | ORAL | 3 refills | Status: DC

## 2016-12-26 NOTE — Telephone Encounter (Signed)
Called and made patient aware of results and recommendations to continue lasix and potassium at current dosages. Patient verbalized understanding. Patient requesting 90 day supply of both meds. Rx sent to patient's preferred pharmacy. Patient thanked me for the call.

## 2016-12-26 NOTE — Telephone Encounter (Signed)
-----   Message from Jettie Booze, MD sent at 12/26/2016  8:43 AM EST ----- Electrolytes controlled.  BNP in same range.  Continue current meds.

## 2016-12-27 ENCOUNTER — Other Ambulatory Visit: Payer: Self-pay | Admitting: Internal Medicine

## 2016-12-27 DIAGNOSIS — J45991 Cough variant asthma: Secondary | ICD-10-CM

## 2016-12-27 MED ORDER — GABAPENTIN 100 MG PO CAPS: 100.0000 mg | ORAL_CAPSULE | Freq: Three times a day (TID) | ORAL | 1 refills | Status: DC

## 2017-01-11 ENCOUNTER — Encounter: Payer: Self-pay | Admitting: Family Medicine

## 2017-01-11 ENCOUNTER — Ambulatory Visit (INDEPENDENT_AMBULATORY_CARE_PROVIDER_SITE_OTHER): Payer: Medicare Other | Admitting: Family Medicine

## 2017-01-11 VITALS — BP 122/86 | HR 62 | Temp 97.6°F | Ht 62.0 in | Wt 252.5 lb

## 2017-01-11 DIAGNOSIS — J209 Acute bronchitis, unspecified: Secondary | ICD-10-CM | POA: Diagnosis not present

## 2017-01-11 MED ORDER — PROMETHAZINE-CODEINE 6.25-10 MG/5ML PO SYRP: 5.0000 mL | ORAL_SOLUTION | Freq: Every day | ORAL | 0 refills | Status: DC

## 2017-01-11 NOTE — Progress Notes (Signed)
Pre visit review using our clinic review tool, if applicable. No additional management support is needed unless otherwise documented below in the visit note. 

## 2017-01-11 NOTE — Progress Notes (Signed)
Chief Complaint  Patient presents with  . Cough    congestion    Shawnee Knapp here for URI complaints.  Duration: 6 days  Associated symptoms: SOB (Hx of asthma, seeing pulm tomorrow), rhinorrhea and cough Denies: sinus congestion, sinus pain, itchy watery eyes, ear pain, ear drainage, myalgia and fevers/rigors Treatment to date: [place] Sick contacts: Yes- husband  ROS:  Const: Denies fevers HEENT: As noted in HPI Lungs: +SOB  Past Medical History:  Diagnosis Date  . Arthritis   . Asthma    childhood now returning  . Back pain affecting pregnancy 11/02/2014  . Benign essential tremor 10/28/2014  . Bilateral dry eyes   . Cancer (HCC)    Breast  . Dry eyes   . Encounter for Medicare annual wellness exam 05/22/2016  . Essential hypertension, benign 10/28/2014  . H/O measles   . H/O mumps   . History of chicken pox   . Lumbago 11/02/2014  . Mixed hyperlipidemia 10/28/2014  . Obesity 10/28/2014   Family History  Problem Relation Age of Onset  . Heart disease Mother   . Heart attack Mother   . Heart failure Mother   . Hypertension Mother   . Cancer Father        colon  . Asthma Father   . Parkinson's disease Father   . Cancer Maternal Grandmother        uterine  . Heart disease Maternal Grandfather   . Obesity Daughter   . Appendicitis Paternal Grandmother   . Stroke Neg Hx     BP 122/86 (BP Location: Left Arm, Patient Position: Sitting, Cuff Size: Large)   Pulse 62   Temp 97.6 F (36.4 C) (Oral)   Ht 5\' 2"  (1.575 m)   Wt 252 lb 8 oz (114.5 kg)   SpO2 97%   BMI 46.18 kg/m  General: Awake, alert, appears stated age HEENT: AT, [place], ears patent b/l and TM's neg, nares patent w/o discharge, pharynx pink and without exudates, MMM Neck: No masses or asymmetry   Heart: RRR, no bruits Lungs: CTAB, no accessory muscle use Psych: Age appropriate judgment and insight, normal mood and affect  Acute bronchitis, unspecified organism - Plan: promethazine-codeine (PHENERGAN  WITH CODEINE) 6.25-10 MG/5ML syrup  Orders as above. Only take at night, do not drive or do anything requiring wits about her on this medicine. Continue to push fluids, practice good hand hygiene, cover mouth when coughing. F/u prn. If starting to experience fevers, shaking, or shortness of breath, seek immediate care. Pt voiced understanding and agreement to the plan.  [place], DO 01/11/17 9:04 AM

## 2017-01-11 NOTE — Patient Instructions (Addendum)
Continue to push fluids, practice good hand hygiene, and cover your mouth if you cough.  If you start having fevers, shaking or shortness of breath, seek immediate care.  Let us know if you need anything.  

## 2017-01-12 ENCOUNTER — Ambulatory Visit (INDEPENDENT_AMBULATORY_CARE_PROVIDER_SITE_OTHER): Payer: Medicare Other | Admitting: Internal Medicine

## 2017-01-12 ENCOUNTER — Encounter: Payer: Self-pay | Admitting: Internal Medicine

## 2017-01-12 VITALS — BP 134/74 | HR 66 | Ht 62.0 in | Wt 252.0 lb

## 2017-01-12 DIAGNOSIS — J45991 Cough variant asthma: Secondary | ICD-10-CM | POA: Diagnosis not present

## 2017-01-12 MED ORDER — METHYLPREDNISOLONE ACETATE 80 MG/ML IJ SUSP
120.0000 mg | Freq: Once | INTRAMUSCULAR | Status: AC
  Administered 2017-01-12: 120 mg via INTRAMUSCULAR

## 2017-01-12 MED ORDER — GABAPENTIN 300 MG PO CAPS: 300.0000 mg | ORAL_CAPSULE | Freq: Three times a day (TID) | ORAL | 0 refills | Status: DC

## 2017-01-12 NOTE — Patient Instructions (Addendum)
Increase  Gabapentin to 300 mg three times a day with meals   If still coughing > take your codeine cough medication up to every 4 hours as needed but should gradually improve to point where you don't need it  Prevacid 30 mg Take 30- 60 min before your first and last meals of the day   Depomedrol 120 mg IM today    Please schedule a follow up office visit in 6 weeks, call sooner if needed with all medications /inhalers/ solutions in hand so we can verify exactly what you are taking. This includes all medications from all doctors and over the counters

## 2017-01-12 NOTE — Progress Notes (Signed)
Subjective:     Patient ID: Amanda Copeland, female   DOB: 30-Apr-1940,    MRN: [id number]    Brief patient profile:  15 yowf originally from [place] never smoker dx as asthma age 76 through HS on prns with tendency to bronchitis pretty much every winter in her 15s s antecedent uri's with progressive doe x 2017 so referred to pulmonary clinic 10/06/2016 by Dr   Nani Ravens.     History of Present Illness  10/06/2016 1st [facility] office visit/ Amanda Copeland  On inderal 20 bid /saba/pulmocort / prevacid with bfast Chief Complaint  Patient presents with  . Pulmonary Consult    Referred by Dr. Riki Sheer. Pt c/o SOB for the past year, progressively worsening. She states she gets out of breath just walking short distances such as from lobby to our closest exam room today. She is also winded when she first lies down at night. She also c/o non prod cough. She has pulmicort inhaler that she uses occ- maybe 4-5 x per wk on average and then she uses proair 1-2 x per wk on average.   here in [place] x 2016 moved into brand new house on arrival and continued to have "same bronchitis each winter" with onset doe  separate from episodes of  bronchitis with poor control of sob dating back to 11/2015 on inderol 20 mg bid x 4 years [facility] Cough comes and goes mostly during the day / no noct p asleep but also some coughing right at hs mostly dry  Last time did HT hanging on the cart  No better with saba  rec Prevacid 30 mg Take 30- 60 min before your first and last meals of the day   Stop inderal and replace with lopressor 25 mg twice daily  GERD  Diet     11/28/2016  f/u ov/Amanda Copeland re: unexplained sob /cough x one year  Chief Complaint  Patient presents with  . Follow-up    PFT's done today. Her breathing is unchanged. She has not used her albuterol.   still coughing most days totally sporadic but not p hs  Doe = MMRC2 = can't walk a nl pace on a flat grade s sob but does fine slow and flat eg HT  Tried  pulmocort x sev weeks no better Off albuterol no worse, back on inderal due to uncontrolled tremor >  tremor better,cough no worse  Using fish oil daily though included in trial gerd diet to eliminate  rec GERD   Gabapentin 100 mg three times a day and stop singulair now     01/12/2017 acute extended  ov/Amanda Copeland re: cough variant asthma vs uacs  Chief Complaint  Patient presents with  . Acute Visit    Increased SOB and cough for the past wk.  Cough has been non prod.   all symptoms improved  on gabapentin / drove to [place] then felt caught cold / same as husband  Cough is non-productive and 24/7 but took phenergan with codeine noct prior to ov , slept fine s noct cough or am flare but still feels something is stuck in her throat since onset of "cold"  Not limited by breathing from desired activities unless coughing     No obvious day to day or daytime variability or assoc excess/ purulent sputum or mucus plugs or hemoptysis or cp or chest tightness, subjective wheeze or overt sinus or hb symptoms. No unusual exposure hx or h/o childhood pna/   or knowledge  of premature birth.  Sleeping ok flat without nocturnal  or early am exacerbation  of respiratory  c/o's or need for noct saba. Also denies any obvious fluctuation of symptoms with weather or environmental changes or other aggravating or alleviating factors except as outlined above   Current Allergies, Complete Past Medical History, Past Surgical History, Family History, and Social History were reviewed in [employer] record.  ROS  The following are not active complaints unless bolded Hoarseness, sore throat, dysphagia, dental problems, itching, sneezing,  nasal congestion or discharge of excess mucus or purulent secretions, ear ache,   fever, chills, sweats, unintended wt loss or wt gain, classically pleuritic or exertional cp,  orthopnea pnd or leg swelling, presyncope, palpitations, abdominal pain, anorexia,  nausea, vomiting, diarrhea  or change in bowel habits or change in bladder habits, change in stools or change in urine, dysuria, hematuria,  rash, arthralgias, visual complaints, headache, numbness, weakness or ataxia or problems with walking or coordination,  change in mood/affect or memory.        Current Meds  Medication Sig  . allopurinol (ZYLOPRIM) 100 MG tablet Take 1 tablet (100 mg total) by mouth daily.  Marland Kitchen aspirin 81 MG tablet Take 81 mg by mouth daily.  Marland Kitchen diltiazem (TIAZAC) 180 MG 24 hr capsule Take 1 capsule (180 mg total) by mouth daily.  . furosemide (LASIX) 40 MG tablet Take 1 tablet (40 mg total) daily by mouth.  . Multiple Vitamins-Minerals (ICAPS AREDS 2) CAPS Take 1 capsule by mouth 2 (two) times daily.  . Potassium Chloride ER 20 MEQ TBCR Take 20 mEq daily by mouth.  Marland Kitchen PREVACID 30 MG capsule TAKE ONE CAPSULE BY MOUTH TWICE A DAY  . promethazine-codeine (PHENERGAN WITH CODEINE) 6.25-10 MG/5ML syrup Take 5 mLs by mouth at bedtime.  . propranolol (INDERAL) 20 MG tablet Take 1 tablet (20 mg total) by mouth 2 (two) times daily.  . Red Yeast Rice Extract (RED YEAST RICE PO) Take by mouth daily.  . [DISCONTINUED] gabapentin (NEURONTIN) 100 MG capsule Take 1 capsule (100 mg total) by mouth 3 (three) times daily. One three times daily                  Objective:   Physical Exam  Obese amb wf struggles to get up from chair to table to weak legs/ no pain    01/12/2017       252  11/28/2016     245    10/06/16 247 lb (112 kg)  08/22/16 246 lb 9.6 oz (111.9 kg)  05/19/16 241 lb (109.3 kg)     Vital signs reviewed - Note on arrival 02 sats  94% on RA        HEENT: nl dentition, turbinates bilaterally, and oropharynx. Nl external ear canals without cough reflex   NECK :  without JVD/Nodes/TM/ nl carotid upstrokes bilaterally   LUNGS: no acc muscle use,  Nl contour chest which is clear to A and P bilaterally without cough on insp or exp maneuvers   CV:  RRR  no s3  or murmur or increase in P2, and no edema   ABD:  soft and nontender with nl inspiratory excursion in the supine position. No bruits or organomegaly appreciated, bowel sounds nl  MS:  Nl gait/ ext warm without deformities, calf tenderness, cyanosis or clubbing No obvious joint restrictions   SKIN: warm and dry without lesions    NEURO:  alert, approp, nl sensorium with  no  motor or cerebellar deficits apparent.            Assessment:

## 2017-01-15 ENCOUNTER — Encounter: Payer: Self-pay | Admitting: Internal Medicine

## 2017-01-15 NOTE — Assessment & Plan Note (Signed)
Allergy profile  10/06/16  >  Eos 0.2 /  IgE  4  RAST neg  Trial off propranolol 10/06/16 > no change  - 10/06/2016  After extensive coaching HFA effectiveness =    75%  - PFT's  11/28/2016  FEV1 1.54 (86 % ) ratio 77  p no % improvement from saba p nothing prior to study with DLCO  71/75  % corrects to 97  % for alv volume with mild exp curvature  - trial of gabapentin 100 tid 11/28/2016 > improved until uri > 01/12/2017 increase to 300 tid   Cough was improving but still present with sense of globus then acutely worse until provided with codeine cough syrup.  Of the three most common causes of  Sub-acute or recurrent or chronic cough, only one (GERD)  can actually contribute to/ trigger  the other two (asthma and post nasal drip syndrome)  and perpetuate the cylce of cough.  While not intuitively obvious, many patients with chronic low grade reflux do not cough until there is a primary insult that disturbs the protective epithelial barrier and exposes sensitive nerve endings.   This is typically viral but can be direct physical injury such as with an endotracheal tube.   The point is that once this occurs, it is difficult to eliminate the cycle  using anything but a maximally effective acid suppression regimen at least in the short run, accompanied by an appropriate diet to address non acid GERD and control / eliminate the cough itself for at least 3 days with codeine cough syrup and add  1st gen H1 blockers per guidelines  And increase gabapentin to 300 tid if tolerates    I had an extended discussion with the patient reviewing all relevant studies completed to date and  lasting 15 to 20 minutes of a 25 minute acute visit    Each maintenance medication was reviewed in detail including most importantly the difference between maintenance and prns and under what circumstances the prns are to be triggered using an action plan format that is not reflected in the computer generated alphabetically organized  AVS.    Please see AVS for specific instructions unique to this visit that I personally wrote and verbalized to the the pt in detail and then reviewed with pt  by my nurse highlighting any  changes in therapy recommended at today's visit to their plan of care.

## 2017-01-23 ENCOUNTER — Encounter: Payer: Self-pay | Admitting: Gastroenterology

## 2017-01-23 ENCOUNTER — Ambulatory Visit (INDEPENDENT_AMBULATORY_CARE_PROVIDER_SITE_OTHER): Payer: Medicare Other | Admitting: Gastroenterology

## 2017-01-23 VITALS — BP 126/74 | HR 72 | Ht 61.0 in | Wt 245.4 lb

## 2017-01-23 DIAGNOSIS — Z800 Family history of malignant neoplasm of digestive organs: Secondary | ICD-10-CM

## 2017-01-23 NOTE — Progress Notes (Signed)
HPI: This is a very pleasant 76 year old woman who was referred to me by [name], MD  to evaluate family history colon cancer.    Chief complaint is family history of colon cancer  Colonoscopy in [place] about 5 years ago.   She believes it was normal.  Prior to that was getting them every 3 years.  This was all for family history of colon cancer, her father had colon cancer diagnosed in his early 81s.  She does not believe she has ever had polyps removed however her primary care physician said that she believed she did have polyps removed at one point.  I am not able to view any of her previous colonoscopy reports at this appointment today. She has mild dyspnea No GI issues no overt GI bleeding,; no change in her bowels, no significant abdominal pains.  For the past several months.  She has been evaluated by pulmonary and cardiology and neither of them see a clear explanation for it.    Review of systems: Pertinent positive and negative review of systems were noted in the above HPI section. All other review negative.   Past Medical History:  Diagnosis Date  . Arthritis   . Asthma    childhood now returning  . Back pain affecting pregnancy 11/02/2014  . Benign essential tremor 10/28/2014  . Bilateral dry eyes   . Cancer (HCC)    Breast  . Dry eyes   . Encounter for Medicare annual wellness exam 05/22/2016  . Essential hypertension, benign 10/28/2014  . H/O measles   . H/O mumps   . History of chicken pox   . Lumbago 11/02/2014  . Mixed hyperlipidemia 10/28/2014  . Obesity 10/28/2014    Past Surgical History:  Procedure Laterality Date  . BACK SURGERY    . BREAST REDUCTION SURGERY Left   . CHOLECYSTECTOMY    . EYE SURGERY  2008   b/l cataracts removed, in [place]  . MASTECTOMY Right   . REPLACEMENT TOTAL KNEE BILATERAL Bilateral   . ROTATOR CUFF REPAIR Left     Current Outpatient Medications  Medication Sig Dispense Refill  . allopurinol (ZYLOPRIM) 100 MG  tablet Take 1 tablet (100 mg total) by mouth daily. 180 tablet 0  . aspirin 81 MG tablet Take 81 mg by mouth daily.    Marland Kitchen diltiazem (TIAZAC) 180 MG 24 hr capsule Take 1 capsule (180 mg total) by mouth daily. 90 capsule 2  . furosemide (LASIX) 40 MG tablet Take 1 tablet (40 mg total) daily by mouth. 90 tablet 3  . gabapentin (NEURONTIN) 300 MG capsule Take 1 capsule (300 mg total) by mouth 3 (three) times daily. 270 capsule 0  . Multiple Vitamins-Minerals (ICAPS AREDS 2) CAPS Take 1 capsule by mouth 2 (two) times daily.    . Potassium Chloride ER 20 MEQ TBCR Take 20 mEq daily by mouth. 90 tablet 3  . PREVACID 30 MG capsule TAKE ONE CAPSULE BY MOUTH TWICE A DAY 60 capsule 6  . promethazine-codeine (PHENERGAN WITH CODEINE) 6.25-10 MG/5ML syrup Take 5 mLs by mouth at bedtime. 120 mL 0  . propranolol (INDERAL) 20 MG tablet Take 1 tablet (20 mg total) by mouth 2 (two) times daily. 60 tablet 0  . Red Yeast Rice Extract (RED YEAST RICE PO) Take by mouth daily.     No current facility-administered medications for this visit.     Allergies as of 01/23/2017 - Review Complete 01/23/2017  Allergen Reaction Noted  .  Amoxicillin Rash 10/27/2014  . Ampicillin Rash 10/27/2014  . Penicillins Rash 10/27/2014  . Statins Other (See Comments) 10/27/2014    Family History  Problem Relation Age of Onset  . Heart disease Mother   . Heart attack Mother   . Heart failure Mother   . Hypertension Mother   . Cancer Father        colon  . Asthma Father   . Parkinson's disease Father   . Cancer Maternal Grandmother        uterine  . Heart disease Maternal Grandfather   . Obesity Daughter   . Appendicitis Paternal Grandmother   . Stroke Neg Hx     Social History   Socioeconomic History  . Marital status: Married    Spouse name: Not on file  . Number of children: Not on file  . Years of education: Not on file  . Highest education level: Not on file  Social Needs  . Financial resource strain: Not on  file  . Food insecurity - worry: Not on file  . Food insecurity - inability: Not on file  . Transportation needs - medical: Not on file  . Transportation needs - non-medical: Not on file  Occupational History  . 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 Smoking status: Never Smoker  . Smokeless tobacco: Never Used  Substance and Sexual Activity  . Alcohol use: Yes    Alcohol/week: 0.0 oz    Comment: two times a month  . Drug use: No  . Sexual activity: Not on file    Comment: lives with husband, moved [place], no dietary restrictions, retired Education officer, [employer]  Other Topics Concern  . Not on file  Social History Narrative  . Not on file     Physical Exam: BP 126/74   Pulse 72   Ht 5\' 1"  (1.549 m)   Wt 245 lb 6 oz (111.3 kg)   BMI 46.36 kg/m  Constitutional: generally well-appearing Psychiatric: alert and oriented x3 Eyes: extraocular movements intact Mouth: oral pharynx moist, no lesions Neck: supple no lymphadenopathy Cardiovascular: heart regular rate and rhythm Lungs: clear to auscultation bilaterally Abdomen: soft, nontender, nondistended, no obvious ascites, no peritoneal signs, normal bowel sounds Extremities: no lower extremity edema bilaterally Skin: no lesions on visible extremities   Assessment and plan: 76 y.o. female with family history of colon cancer, morbid obesity  We had a nice discussion about colon cancer screening guidelines.  Specifically we discussed the changes in the nationally accepted guidelines with regards to the finding significant family history.  Current guidelines specify that first-degree relatives with colon cancer should have been diagnosed by their late 50s or before order to classify as a significant family history.  Her father who is the only one with colon cancer in her family was diagnosed in his early 53s.  She has had several colonoscopies in the past and I would like to review those before making a final  decision here but unless she has had significant polyp burden previously which she is not aware of, I would probably recommend that she not have colon cancer screening at this point.    Please see the "Patient Instructions" section for addition details about the plan.   Owens Loffler, MD [facility] 01/23/2017, 9:22 AM  Cc: [name], MD

## 2017-01-23 NOTE — Patient Instructions (Addendum)
We will get records sent from your previous gastroenterologist (in [facility] 2013 and prior) for review.  This will include any endoscopic (colonoscopy or upper endoscopy) procedures and any associated pathology reports.    Normal BMI (Body Mass Index- based on height and weight) is between 23 and 30. Your BMI today is Body mass index is 46.36 kg/m. Marland Kitchen Please consider follow up  regarding your BMI with your Primary Care Provider.

## 2017-02-15 ENCOUNTER — Encounter: Payer: Self-pay | Admitting: Family Medicine

## 2017-02-15 ENCOUNTER — Ambulatory Visit (INDEPENDENT_AMBULATORY_CARE_PROVIDER_SITE_OTHER): Payer: Medicare Other | Admitting: Family Medicine

## 2017-02-15 VITALS — BP 128/82 | HR 64 | Temp 97.9°F | Ht 61.0 in | Wt 248.4 lb

## 2017-02-15 DIAGNOSIS — B9689 Other specified bacterial agents as the cause of diseases classified elsewhere: Secondary | ICD-10-CM

## 2017-02-15 DIAGNOSIS — J208 Acute bronchitis due to other specified organisms: Secondary | ICD-10-CM | POA: Diagnosis not present

## 2017-02-15 MED ORDER — AZITHROMYCIN 250 MG PO TABS: ORAL_TABLET | ORAL | 0 refills | Status: DC

## 2017-02-15 MED ORDER — METHYLPREDNISOLONE ACETATE 80 MG/ML IJ SUSP
80.0000 mg | Freq: Once | INTRAMUSCULAR | Status: AC
  Administered 2017-02-15: 80 mg via INTRAMUSCULAR

## 2017-02-15 MED ORDER — PROMETHAZINE-CODEINE 6.25-10 MG/5ML PO SYRP: 5.0000 mL | ORAL_SOLUTION | Freq: Four times a day (QID) | ORAL | 0 refills | Status: DC | PRN

## 2017-02-15 NOTE — Progress Notes (Signed)
Pre visit review using our clinic review tool, if applicable. No additional management support is needed unless otherwise documented below in the visit note. 

## 2017-02-15 NOTE — Patient Instructions (Signed)
Continue to push fluids, practice good hand hygiene, and cover your mouth if you cough.  If you start having fevers, shaking or shortness of breath, seek immediate care.  Let us know if you need anything.  

## 2017-02-15 NOTE — Progress Notes (Signed)
Chief Complaint  Patient presents with  . Cough    Amanda Copeland here for URI complaints.  Duration: 1 week  Associated symptoms: ear fullness, sore throat, wheezing, shortness of breath and cough Denies: sinus congestion, rhinorrhea, itchy watery eyes, ear drainage, myalgia and fevers/rigors Treatment to date: [place] contacts: Yes; husband tx'd for bact bronchitis by me  ROS:  Const: Denies fevers HEENT: As noted in HPI Lungs: +SOB  Past Medical History:  Diagnosis Date  . Arthritis   . Asthma    childhood now returning  . Back pain affecting pregnancy 11/02/2014  . Benign essential tremor 10/28/2014  . Bilateral dry eyes   . Cancer (HCC)    Breast  . Dry eyes   . Encounter for Medicare annual wellness exam 05/22/2016  . Essential hypertension, benign 10/28/2014  . H/O measles   . H/O mumps   . History of chicken pox   . Lumbago 11/02/2014  . Mixed hyperlipidemia 10/28/2014  . Obesity 10/28/2014   Family History  Problem Relation Age of Onset  . Heart disease Mother   . Heart attack Mother   . Heart failure Mother   . Hypertension Mother   . Cancer Father        colon  . Asthma Father   . Parkinson's disease Father   . Cancer Maternal Grandmother        uterine  . Heart disease Maternal Grandfather   . Obesity Daughter   . Appendicitis Paternal Grandmother   . Stroke Neg Hx     BP 128/82 (BP Location: Left Arm, Patient Position: Sitting, Cuff Size: Normal)   Pulse 64   Temp 97.9 F (36.6 C) (Oral)   Ht 5\' 1"  (1.549 m)   Wt 248 lb 6 oz (112.7 kg)   SpO2 94%   BMI 46.93 kg/m  General: Awake, alert, appears stated age HEENT: AT, [place], ears patent b/l and TM's neg, nares patent w/o discharge, pharynx pink and without exudates, MMM Neck: No masses or asymmetry Heart: RRR Lungs: CTAB, no accessory muscle use Psych: Age appropriate judgment and insight, normal mood and affect  Acute bacterial bronchitis - Plan: azithromycin (ZITHROMAX) 250 MG tablet,  promethazine-codeine (PHENERGAN WITH CODEINE) 6.25-10 MG/5ML syrup, methylPREDNISolone acetate (DEPO-MEDROL) injection 80 mg  Orders as above.  Has done well with cough medicine before.  I do not hear any wheezing today, but given her history of asthma and URIs being a trigger, will give an injection of Depo today. Continue to push fluids, practice good hand hygiene, cover mouth when coughing. F/u prn. If starting to experience fevers, shaking, or shortness of breath, seek immediate care. Pt voiced understanding and agreement to the plan.  [place], DO 02/15/17 12:19 PM

## 2017-02-20 ENCOUNTER — Telehealth: Payer: Self-pay | Admitting: Gastroenterology

## 2017-02-20 NOTE — Telephone Encounter (Signed)
The patient has been notified of this information and all questions answered.

## 2017-02-20 NOTE — Telephone Encounter (Signed)
Colonoscopy October 2014, [place].  Indication "colonic diarrhea, family history of colon cancer".  Findings normal ileum, diverticulosis but otherwise normal.  No polyps.  Biopsies were taken from the ileum and colon and they were both normal.  Can you please call her. I reviewed her colonoscopy from [place].  She did not have any polyps then.  Her father's colon cancer was diagnosed when he was in his early 40s.  She does not need any further colon cancer screening.

## 2017-02-21 DIAGNOSIS — Z961 Presence of intraocular lens: Secondary | ICD-10-CM | POA: Diagnosis not present

## 2017-02-21 DIAGNOSIS — H35452 Secondary pigmentary degeneration, left eye: Secondary | ICD-10-CM | POA: Diagnosis not present

## 2017-02-21 DIAGNOSIS — H43812 Vitreous degeneration, left eye: Secondary | ICD-10-CM | POA: Diagnosis not present

## 2017-02-21 DIAGNOSIS — H43811 Vitreous degeneration, right eye: Secondary | ICD-10-CM | POA: Diagnosis not present

## 2017-02-21 DIAGNOSIS — H35451 Secondary pigmentary degeneration, right eye: Secondary | ICD-10-CM | POA: Diagnosis not present

## 2017-02-21 DIAGNOSIS — H353122 Nonexudative age-related macular degeneration, left eye, intermediate dry stage: Secondary | ICD-10-CM | POA: Diagnosis not present

## 2017-02-21 DIAGNOSIS — H353112 Nonexudative age-related macular degeneration, right eye, intermediate dry stage: Secondary | ICD-10-CM | POA: Diagnosis not present

## 2017-02-23 ENCOUNTER — Encounter: Payer: Self-pay | Admitting: *Deleted

## 2017-02-23 ENCOUNTER — Ambulatory Visit (INDEPENDENT_AMBULATORY_CARE_PROVIDER_SITE_OTHER)
Admission: RE | Admit: 2017-02-23 | Discharge: 2017-02-23 | Disposition: A | Discharge status: Home / Self Care | Payer: Medicare Other | Source: Ambulatory Visit | Attending: Internal Medicine | Admitting: Internal Medicine

## 2017-02-23 ENCOUNTER — Ambulatory Visit (INDEPENDENT_AMBULATORY_CARE_PROVIDER_SITE_OTHER): Payer: Medicare Other | Admitting: Internal Medicine

## 2017-02-23 ENCOUNTER — Encounter: Payer: Self-pay | Admitting: Internal Medicine

## 2017-02-23 ENCOUNTER — Ambulatory Visit: Payer: Self-pay | Admitting: Family Medicine

## 2017-02-23 VITALS — BP 126/64 | HR 74 | Ht 61.0 in | Wt 243.0 lb

## 2017-02-23 DIAGNOSIS — J45991 Cough variant asthma: Secondary | ICD-10-CM | POA: Diagnosis not present

## 2017-02-23 DIAGNOSIS — R05 Cough: Secondary | ICD-10-CM

## 2017-02-23 DIAGNOSIS — R0602 Shortness of breath: Secondary | ICD-10-CM | POA: Diagnosis not present

## 2017-02-23 DIAGNOSIS — R059 Cough, unspecified: Secondary | ICD-10-CM

## 2017-02-23 LAB — NITRIC OXIDE: 11800305105: 5

## 2017-02-23 MED ORDER — FLUTTER DEVI: 0 refills | Status: DC

## 2017-02-23 NOTE — Progress Notes (Signed)
LMTCB

## 2017-02-23 NOTE — Patient Instructions (Addendum)
For drainage / throat tickle try take CHLORPHENIRAMINE  4 mg - take one every 4 hours as needed - available over the counter- may cause drowsiness so start with just a bedtime dose or two and see how you tolerate it before trying in daytime    For cough/ congestion > use flutter valve as much  as possible   Please see patient coordinator before you leave today  to schedule sinus CT and methacholine challenge test   Please remember to go to the  x-ray department downstairs in the basement  for your tests - we will call you with the results when they are available.  Please schedule a follow up office visit in 4 weeks, sooner if needed  with all medications /inhalers/ solutions in hand so we can verify exactly what you are taking. This includes all medications from all doctors and over the counters

## 2017-02-23 NOTE — Progress Notes (Signed)
Subjective:     Patient ID: Amanda Copeland, female   DOB: 1940/05/11,    MRN: [id number]    Brief patient profile:  51 yowf originally from [place] never smoker dx as asthma age 77 through HS on prns with tendency to bronchitis pretty much every winter in her 39s s antecedent uri's with progressive doe x 2017 so referred to pulmonary clinic 10/06/2016 by Dr   Nani Ravens.     History of Present Illness  10/06/2016 1st [facility] office visit/ Amanda Copeland  On inderal 20 bid /saba/pulmocort / prevacid with bfast Chief Complaint  Patient presents with  . Pulmonary Consult    Referred by Dr. Riki Sheer. Pt c/o SOB for the past year, progressively worsening. She states she gets out of breath just walking short distances such as from lobby to our closest exam room today. She is also winded when she first lies down at night. She also c/o non prod cough. She has pulmicort inhaler that she uses occ- maybe 4-5 x per wk on average and then she uses proair 1-2 x per wk on average.   here in [place] x 2016 moved into brand new house on arrival and continued to have "same bronchitis each winter" with onset doe  separate from episodes of  bronchitis with poor control of sob dating back to 11/2015 on inderol 20 mg bid x 4 years [facility] Cough comes and goes mostly during the day / no noct p asleep but also some coughing right at hs mostly dry  Last time did HT hanging on the cart  No better with saba  rec Prevacid 30 mg Take 30- 60 min before your first and last meals of the day   Stop inderal and replace with lopressor 25 mg twice daily  GERD  Diet        11/28/2016  f/u ov/Amanda Copeland re: unexplained sob /cough x one year  Chief Complaint  Patient presents with  . Follow-up    PFT's done today. Her breathing is unchanged. She has not used her albuterol.   still coughing most days totally sporadic but not p hs  Doe = MMRC2 = can't walk a nl pace on a flat grade s sob but does fine slow and flat eg HT   Tried pulmocort x sev weeks no better Off albuterol no worse, back on inderal due to uncontrolled tremor >  tremor better,cough no worse  Using fish oil daily though included in trial gerd diet to eliminate  rec GERD   Gabapentin 100 mg three times a day and stop singulair now     01/12/2017 acute extended  ov/Amanda Copeland re: cough variant asthma vs uacs  Chief Complaint  Patient presents with  . Acute Visit    Increased SOB and cough for the past wk.  Cough has been non prod.   all symptoms improved  on gabapentin / drove to [place] then felt caught cold / same as husband  Cough is non-productive and 24/7 but took phenergan with codeine noct prior to ov , slept fine s noct cough or am flare but still feels something is stuck in her throat since onset of "cold" rec Increase  Gabapentin to 300 mg three times a day with meals  If still coughing > take your codeine cough medication up to every 4 hours as needed but should gradually improve to point where you don't need it Prevacid 30 mg Take 30- 60 min before your first and last meals  of the day  Depomedrol 120 mg IM today     02/23/2017  f/u ov/Amanda Copeland re: uacs / sob x 11/2015  Chief Complaint  Patient presents with  . Follow-up    Increased cough x 2 wks- mainly non prod, but she did produce a min amount of pale yellow sputum this am.   one week prior to flare not needing any codeine at all and breathing had improved and had no reproducible doe at last in office walk Then flare  x 2 weeks prior to OV  p exposed to sick fm member, husband also caught same cold   Then one week pred shot/ zpak  > improved back to baseline cough and doe x MMRC2 = can't walk a nl pace on a flat grade s sob but does fine slow and flat  But also sob at rest during coughing fits  No obvious day to day or daytime variability or assoc truly  excess/ purulent sputum or mucus plugs or hemoptysis or cp or chest tightness, subjective wheeze or overt sinus or hb  symptoms. No unusual exposure hx or h/o childhood pna/ asthma or knowledge of premature birth.  Sleeping ok 2 pillows  without nocturnal  or early am exacerbation  of respiratory  c/o's or need for noct saba. Also denies any obvious fluctuation of symptoms with weather or environmental changes or other aggravating or alleviating factors except as outlined above   Current Allergies, Complete Past Medical History, Past Surgical History, Family History, and Social History were reviewed in [employer] record.  ROS  The following are not active complaints unless bolded Hoarseness, sore throat, dysphagia, dental problems, itching, sneezing,  nasal congestion or discharge of excess mucus or purulent secretions, ear ache,   fever, chills, sweats, unintended wt loss or wt gain, classically pleuritic or exertional cp,  orthopnea pnd or leg swelling, presyncope, palpitations, abdominal pain, anorexia, nausea, vomiting, diarrhea  or change in bowel habits or change in bladder habits, change in stools or change in urine, dysuria, hematuria,  rash, arthralgias, visual complaints, headache, numbness, weakness or ataxia or problems with walking or coordination,  change in mood/affect or memory.        Current Meds  Medication Sig  . allopurinol (ZYLOPRIM) 100 MG tablet Take 1 tablet (100 mg total) by mouth daily.  Marland Kitchen aspirin 81 MG tablet Take 81 mg by mouth daily.  Marland Kitchen diltiazem (TIAZAC) 180 MG 24 hr capsule Take 1 capsule (180 mg total) by mouth daily.  . furosemide (LASIX) 40 MG tablet Take 1 tablet (40 mg total) daily by mouth.  . gabapentin (NEURONTIN) 300 MG capsule Take 1 capsule (300 mg total) by mouth 3 (three) times daily.  . Multiple Vitamins-Minerals (ICAPS AREDS 2) CAPS Take 1 capsule by mouth 2 (two) times daily.  . Potassium Chloride ER 20 MEQ TBCR Take 20 mEq daily by mouth.  Marland Kitchen PREVACID 30 MG capsule TAKE ONE CAPSULE BY MOUTH TWICE A DAY  . promethazine-codeine (PHENERGAN WITH  CODEINE) 6.25-10 MG/5ML syrup Take 5 mLs by mouth every 6 (six) hours as needed for cough.  . propranolol (INDERAL) 20 MG tablet Take 1 tablet (20 mg total) by mouth 2 (two) times daily.  . Red Yeast Rice Extract (RED YEAST RICE PO) Take by mouth daily.       .                   Objective:   Physical Exam  Obese amb  wf/ congested rattling cough     02/23/2017       243  01/12/2017       252  11/28/2016     245    10/06/16 247 lb (112 kg)  08/22/16 246 lb 9.6 oz (111.9 kg)  05/19/16 241 lb (109.3 kg)     Vital signs reviewed - Note on arrival 02 sats  98% on RA    HEENT: nl dentition, turbinates bilaterally, and oropharynx. Nl external ear canals without cough reflex   NECK :  without JVD/Nodes/TM/ nl carotid upstrokes bilaterally   LUNGS: no acc muscle use,  Nl contour chest which is clear to A and P bilaterally without cough on insp or exp maneuvers   CV:  RRR  no s3 or murmur or increase in P2, and no edema   ABD:  soft and nontender with nl inspiratory excursion in the supine position. No bruits or organomegaly appreciated, bowel sounds nl  MS:  Nl gait/ ext warm without deformities, calf tenderness, cyanosis or clubbing No obvious joint restrictions   SKIN: warm and dry without lesions    NEURO:  alert, approp, nl sensorium with  no motor or cerebellar deficits apparent.          CXR PA and Lateral:   02/23/2017 :    I personally reviewed images and agree with radiology impression as follows:    1. No pneumonia or effusion. 2. Peribronchial thickening consistent with bronchitis.           Assessment:

## 2017-02-24 ENCOUNTER — Telehealth: Payer: Self-pay | Admitting: Internal Medicine

## 2017-02-24 ENCOUNTER — Ambulatory Visit (HOSPITAL_BASED_OUTPATIENT_CLINIC_OR_DEPARTMENT_OTHER)
Admission: RE | Admit: 2017-02-24 | Discharge: 2017-02-24 | Disposition: A | Discharge status: Home / Self Care | Payer: Medicare Other | Source: Ambulatory Visit | Attending: Internal Medicine | Admitting: Internal Medicine

## 2017-02-24 DIAGNOSIS — J45991 Cough variant asthma: Secondary | ICD-10-CM | POA: Insufficient documentation

## 2017-02-24 DIAGNOSIS — R059 Cough, unspecified: Secondary | ICD-10-CM

## 2017-02-24 DIAGNOSIS — J3489 Other specified disorders of nose and nasal sinuses: Secondary | ICD-10-CM | POA: Insufficient documentation

## 2017-02-24 DIAGNOSIS — J329 Chronic sinusitis, unspecified: Secondary | ICD-10-CM | POA: Diagnosis not present

## 2017-02-24 DIAGNOSIS — R05 Cough: Secondary | ICD-10-CM

## 2017-02-24 NOTE — Telephone Encounter (Signed)
Notes recorded by Tanda Rockers, MD on 02/23/2017 at 5:05 PM EST Call pt: Reviewed cxr and no acute change so no change in recommendations made at ov ------------------------------- Spoke with pt. She is aware of results. Nothing further was needed.

## 2017-02-27 ENCOUNTER — Other Ambulatory Visit: Payer: Self-pay | Admitting: Internal Medicine

## 2017-02-27 ENCOUNTER — Encounter: Payer: Self-pay | Admitting: Internal Medicine

## 2017-02-27 MED ORDER — LEVOFLOXACIN 500 MG PO TABS: 500.0000 mg | ORAL_TABLET | Freq: Every day | ORAL | 0 refills | Status: DC

## 2017-02-27 NOTE — Progress Notes (Signed)
Spoke with pt and notified of results per Dr. Melvyn Novas. Pt verbalized understanding and denied any questions. She does not want to take omnicef and felt more comfortable taking levaquin, so rx was sent to [facility]

## 2017-02-27 NOTE — Assessment & Plan Note (Signed)
Allergy profile  10/06/16  >  Eos 0.2 /  IgE  4  RAST neg  Trial off propranolol 10/06/16 > no change  - 10/06/2016  After extensive coaching HFA effectiveness =    75%  - PFT's  11/28/2016  FEV1 1.54 (86 % ) ratio 77  p no % improvement from saba p nothing prior to study with DLCO  71/75  % corrects to 97  % for alv volume with mild exp curvature  - trial of gabapentin 100 tid 11/28/2016 > improved until uri > 01/12/2017 increase to 300 tid   - FENO 02/23/2017  =   Could not do  - Spirometry 02/23/2017  FEV1 1.32 (75%)  Ratio 72   - .flutter valve training 02/23/2017  - Sinus CT ordered    Lack of cough resolution on a verified empirical regimen could mean an alternative diagnosis, persistence of the disease state (eg sinusitis or bronchiectasis) , or inadequacy of currently available therapy (eg no medical rx available for non-acid gerd)  We have not excluded sinusitis here nor bronchiectasis and she def has risk factor for asthma = chronic use of inderal. The good news is that between "colds" she feels better on gabapentin 300 tid than she has for the last year  rec  Proceed with w/u and rx of sinusitis if present/ flutter valve training and then MCT  Next step   I had an extended discussion with the patient/husband  reviewing all relevant studies completed to date and  lasting 15 to 20 minutes of a 25 minute visit    Each maintenance medication was reviewed in detail including most importantly the difference between maintenance and prns and under what circumstances the prns are to be triggered using an action plan format that is not reflected in the computer generated alphabetically organized AVS.    Please see AVS for specific instructions unique to this visit that I personally wrote and verbalized to the the pt in detail and then reviewed with pt  by my nurse highlighting any  changes in therapy recommended at today's visit to their plan of care.

## 2017-02-27 NOTE — Assessment & Plan Note (Signed)
Body mass index is 45.91 kg/m.  -  trending down/ encouraged Lab Results  Component Value Date   TSH 2.40 10/27/2016     Contributing to gerd risk/ doe/reviewed the need and the process to achieve and maintain neg calorie balance > defer f/u primary care including intermittently monitoring thyroid status

## 2017-03-03 ENCOUNTER — Encounter: Payer: Self-pay | Admitting: Family Medicine

## 2017-03-03 ENCOUNTER — Ambulatory Visit (INDEPENDENT_AMBULATORY_CARE_PROVIDER_SITE_OTHER): Payer: Medicare Other | Admitting: Family Medicine

## 2017-03-03 ENCOUNTER — Ambulatory Visit (HOSPITAL_BASED_OUTPATIENT_CLINIC_OR_DEPARTMENT_OTHER)
Admission: RE | Admit: 2017-03-03 | Discharge: 2017-03-03 | Disposition: A | Discharge status: Home / Self Care | Payer: Medicare Other | Source: Ambulatory Visit | Attending: Family Medicine | Admitting: Family Medicine

## 2017-03-03 VITALS — BP 126/82 | HR 70 | Temp 97.4°F | Resp 18 | Wt 247.2 lb

## 2017-03-03 DIAGNOSIS — R42 Dizziness and giddiness: Secondary | ICD-10-CM

## 2017-03-03 DIAGNOSIS — Z23 Encounter for immunization: Secondary | ICD-10-CM | POA: Diagnosis not present

## 2017-03-03 DIAGNOSIS — E782 Mixed hyperlipidemia: Secondary | ICD-10-CM

## 2017-03-03 DIAGNOSIS — I6523 Occlusion and stenosis of bilateral carotid arteries: Secondary | ICD-10-CM | POA: Diagnosis not present

## 2017-03-03 DIAGNOSIS — R296 Repeated falls: Secondary | ICD-10-CM | POA: Diagnosis not present

## 2017-03-03 DIAGNOSIS — J45991 Cough variant asthma: Secondary | ICD-10-CM

## 2017-03-03 DIAGNOSIS — I10 Essential (primary) hypertension: Secondary | ICD-10-CM

## 2017-03-03 DIAGNOSIS — R739 Hyperglycemia, unspecified: Secondary | ICD-10-CM | POA: Diagnosis not present

## 2017-03-03 DIAGNOSIS — M109 Gout, unspecified: Secondary | ICD-10-CM | POA: Diagnosis not present

## 2017-03-03 DIAGNOSIS — E785 Hyperlipidemia, unspecified: Secondary | ICD-10-CM | POA: Diagnosis not present

## 2017-03-03 LAB — COMPREHENSIVE METABOLIC PANEL
ALT: 11 U/L (ref 0–35)
AST: 14 U/L (ref 0–37)
Albumin: 3.8 g/dL (ref 3.5–5.2)
Alkaline Phosphatase: 87 U/L (ref 39–117)
BUN: 20 mg/dL (ref 6–23)
CALCIUM: 9.2 mg/dL (ref 8.4–10.5)
CHLORIDE: 103 meq/L (ref 96–112)
CO2: 28 meq/L (ref 19–32)
CREATININE: 0.86 mg/dL (ref 0.40–1.20)
GFR: 67.97 mL/min (ref >60.00)
Glucose, Bld: 95 mg/dL (ref 70–99)
POTASSIUM: 4.1 meq/L (ref 3.5–5.1)
Sodium: 142 mEq/L (ref 135–145)
Total Bilirubin: 0.3 mg/dL (ref 0.2–1.2)
Total Protein: 7.1 g/dL (ref 6.0–8.3)

## 2017-03-03 LAB — CBC
HEMATOCRIT: 41.8 % (ref 36.0–46.0)
Hemoglobin: 13.7 g/dL (ref 12.0–15.0)
MCHC: 32.8 g/dL (ref 30.0–36.0)
MCV: 94.5 fl (ref 78.0–100.0)
PLATELETS: 315 10*3/uL (ref 150.0–400.0)
RBC: 4.42 Mil/uL (ref 3.87–5.11)
RDW: 15.2 % (ref 11.5–15.5)
WBC: 8.1 10*3/uL (ref 4.0–10.5)

## 2017-03-03 LAB — LIPID PANEL
CHOL/HDL RATIO: 3
CHOLESTEROL: 208 mg/dL — AB (ref 0–200)
HDL: 65.5 mg/dL (ref >39.00)
LDL CALC: 123 mg/dL — AB (ref 0–99)
NonHDL: 142.09
TRIGLYCERIDES: 97 mg/dL (ref 0.0–149.0)
VLDL: 19.4 mg/dL (ref 0.0–40.0)

## 2017-03-03 LAB — URIC ACID: Uric Acid, Serum: 4.9 mg/dL (ref 2.4–7.0)

## 2017-03-03 LAB — TSH: TSH: 2.81 u[IU]/mL (ref 0.35–4.50)

## 2017-03-03 LAB — HEMOGLOBIN A1C: Hgb A1c MFr Bld: 5.9 % (ref 4.6–6.5)

## 2017-03-03 NOTE — Patient Instructions (Addendum)
Zyrtec/Cetrizine or Claritin/Loratadine 10 mg daily and some flonase nasal steroid daily for the winter And nasal saline flushes twice daily for now and as needed. With valsalva maneuver after each application  64 oz of fluids daily and protein every 4-6 hours.  Allergies An allergy is when your body reacts to a substance in a way that is not normal. An allergic reaction can happen after you:  Eat something.  Breathe in something.  Touch something.  You can be allergic to:  Things that are only around during certain seasons, like molds and pollens.  Foods.  Drugs.  Insects.  Animal dander.  What are the signs or symptoms?  Puffiness (swelling). This may happen on the lips, face, tongue, mouth, or throat.  Sneezing.  Coughing.  Breathing loudly (wheezing).  Stuffy nose.  Tingling in the mouth.  A rash.  Itching.  Itchy, red, puffy areas of skin (hives).  Watery eyes.  Throwing up (vomiting).  Watery poop (diarrhea).  Dizziness.  Feeling faint or fainting.  Trouble breathing or swallowing.  A tight feeling in the chest.  A fast heartbeat. How is this diagnosed? Allergies can be diagnosed with:  A medical and family history.  Skin tests.  Blood tests.  A food diary. A food diary is a record of all the foods, drinks, and symptoms you have each day.  The results of an elimination diet. This diet involves making sure not to eat certain foods and then seeing what happens when you start eating them again.  How is this treated? There is no cure for allergies, but allergic reactions can be treated with medicine. Severe reactions usually need to be treated at a hospital. How is this prevented? The best way to prevent an allergic reaction is to avoid the thing you are allergic to. Allergy shots and medicines can also help prevent reactions in some cases. This information is not intended to replace advice given to you by your health care provider. Make  sure you discuss any questions you have with your health care provider. Document Released: 05/21/2012 Document Revised: 09/21/2015 Document Reviewed: 11/05/2013 Elsevier Interactive Patient Education  [employer].

## 2017-03-03 NOTE — Assessment & Plan Note (Signed)
Well controlled, no changes to meds. Encouraged heart healthy diet such as the DASH diet and exercise as tolerated.  °

## 2017-03-03 NOTE — Assessment & Plan Note (Signed)
No recent flares 

## 2017-03-03 NOTE — Assessment & Plan Note (Signed)
hgba1c acceptable, minimize simple carbs. Increase exercise as tolerated.  

## 2017-03-03 NOTE — Assessment & Plan Note (Addendum)
Did not tolerate the metoprolol but cough is better with treatment of sinusitis found on CT scan. Is encouraged to start Mucinex bid and probiotic daily. Report if cough worsens again.

## 2017-03-03 NOTE — Assessment & Plan Note (Signed)
Encouraged DASH diet, decrease po intake and increase exercise as tolerated. Needs 7-8 hours of sleep nightly. Avoid trans fats, eat small, frequent meals every 4-5 hours with lean proteins, complex carbs and healthy fats. Minimize simple carbs, declines referral

## 2017-03-03 NOTE — Progress Notes (Signed)
Subjective:  I acted as a Education administrator for Dr. Charlett Blake. Amanda Copeland, [place]  Patient ID: Amanda Copeland, female    DOB: [birth year], 77 y.o.   MRN: [id number]  No chief complaint on file.   HPI  Patient is in today for a 3 month follow up and is feeling OK today but has suffered 2 falls over past couple of months. One when she was rushing and tripped the other she is less clear about. No syncope or serious injury. She has been following with pulmonology and she has been diagnosed with sinusitis from a recent CT scan since starting levaquin her cough has improved. She had had several bp meds changed but that did not seem to help. The switch from Propranolol to Metoprolol caused her tremor to worsen so she switched back to propranolol and that has helped. No recent febrile illness or hospitalizations. Denies CP/palp/SOB/HAfevers/GI or GU c/o. Taking meds as prescribed  Patient Care Team: [name], MD as PCP - General (Family Medicine) Druscilla Brownie, MD as Consulting Physician (Dermatology) Jettie Booze, MD as Consulting Physician (Cardiology) Tat, Eustace Quail, DO as Consulting Physician (Neurology) Calvert Cantor, MD as Consulting Physician (Ophthalmology) Tanda Rockers, MD as Consulting Physician (Pulmonary Disease)   Past Medical History:  Diagnosis Date  . Arthritis   . Asthma    childhood now returning  . Back pain affecting pregnancy 11/02/2014  . Benign essential tremor 10/28/2014  . Bilateral dry eyes   . Cancer (HCC)    Breast  . Dry eyes   . Encounter for Medicare annual wellness exam 05/22/2016  . Essential hypertension, benign 10/28/2014  . H/O measles   . H/O mumps   . History of chicken pox   . Lumbago 11/02/2014  . Mixed hyperlipidemia 10/28/2014  . Obesity 10/28/2014    Past Surgical History:  Procedure Laterality Date  . BACK SURGERY    . BREAST REDUCTION SURGERY Left   . CHOLECYSTECTOMY    . EYE SURGERY  2008   b/l cataracts removed, in [place]  .  MASTECTOMY Right   . REPLACEMENT TOTAL KNEE BILATERAL Bilateral   . ROTATOR CUFF REPAIR Left     Family History  Problem Relation Age of Onset  . Heart disease Mother   . Heart attack Mother   . Heart failure Mother   . Hypertension Mother   . Cancer Father        colon  . Asthma Father   . Parkinson's disease Father   . Cancer Maternal Grandmother        uterine  . Heart disease Maternal Grandfather   . Obesity Daughter   . Appendicitis Paternal Grandmother   . Stroke Neg Hx     Social History   Socioeconomic History  . Marital status: Married    Spouse name: Not on file  . Number of children: Not on file  . Years of education: Not on file  . Highest education level: Not on file  Social Needs  . Financial resource strain: Not on file  . Food insecurity - worry: Not on file  . Food insecurity - inability: Not on file  . Transportation needs - medical: Not on file  . Transportation needs - non-medical: Not on file  Occupational History  . 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 Smoking status: Never Smoker  . Smokeless tobacco: Never Used  Substance and Sexual Activity  . Alcohol use: Yes  Alcohol/week: 0.0 oz    Comment: two times a month  . Drug use: No  . Sexual activity: Not on file    Comment: lives with husband, moved [place], no dietary restrictions, retired Education officer, museum  Other Topics Concern  . Not on file  Social History Narrative  . Not on file    Outpatient Medications Prior to Visit  Medication Sig Dispense Refill  . allopurinol (ZYLOPRIM) 100 MG tablet Take 1 tablet (100 mg total) by mouth daily. 180 tablet 0  . aspirin 81 MG tablet Take 81 mg by mouth daily.    Marland Kitchen diltiazem (TIAZAC) 180 MG 24 hr capsule Take 1 capsule (180 mg total) by mouth daily. 90 capsule 2  . furosemide (LASIX) 40 MG tablet Take 1 tablet (40 mg total) daily by mouth. 90 tablet 3  . gabapentin (NEURONTIN) 300 MG capsule Take 1 capsule (300 mg  total) by mouth 3 (three) times daily. 270 capsule 0  . levofloxacin (LEVAQUIN) 500 MG tablet Take 1 tablet (500 mg total) by mouth daily. 10 tablet 0  . Multiple Vitamins-Minerals (ICAPS AREDS 2) CAPS Take 1 capsule by mouth 2 (two) times daily.    . Potassium Chloride ER 20 MEQ TBCR Take 20 mEq daily by mouth. 90 tablet 3  . PREVACID 30 MG capsule TAKE ONE CAPSULE BY MOUTH TWICE A DAY 60 capsule 6  . promethazine-codeine (PHENERGAN WITH CODEINE) 6.25-10 MG/5ML syrup Take 5 mLs by mouth every 6 (six) hours as needed for cough. 120 mL 0  . propranolol (INDERAL) 20 MG tablet Take 1 tablet (20 mg total) by mouth 2 (two) times daily. 60 tablet 0  . Red Yeast Rice Extract (RED YEAST RICE PO) Take by mouth daily.    Marland Kitchen Respiratory Therapy Supplies (FLUTTER) DEVI Use as directed 1 each 0   No facility-administered medications prior to visit.     Allergies  Allergen Reactions  . Amoxicillin Rash  . Ampicillin Rash  . Penicillins Rash  . Statins Other (See Comments)    Per reports muscle aches and joint pain    Review of Systems  Constitutional: Negative for fever and malaise/fatigue.  HENT: Positive for congestion. Negative for sore throat.   Eyes: Negative for blurred vision.  Respiratory: Positive for cough. Negative for shortness of breath.   Cardiovascular: Negative for chest pain, palpitations and leg swelling.  Gastrointestinal: Negative for abdominal pain, blood in stool and nausea.  Genitourinary: Negative for dysuria and frequency.  Musculoskeletal: Positive for falls.  Skin: Negative for rash.  Neurological: Negative for dizziness, loss of consciousness and headaches.  Endo/Heme/Allergies: Negative for environmental allergies.  Psychiatric/Behavioral: Negative for depression. The patient is not nervous/anxious.        Objective:    Physical Exam  Constitutional: She is oriented to person, place, and time. She appears well-developed and well-nourished. No distress.  HENT:    Head: Normocephalic and atraumatic.  Nose: Nose normal.  Eyes: Right eye exhibits no discharge. Left eye exhibits no discharge.  Neck: Normal range of motion. Neck supple.  Cardiovascular: Normal rate and regular rhythm.  No murmur heard. Pulmonary/Chest: Effort normal and breath sounds normal.  Abdominal: Soft. Bowel sounds are normal. There is no tenderness.  Musculoskeletal: She exhibits no edema.  Neurological: She is alert and oriented to person, place, and time.  Skin: Skin is warm and dry.  Psychiatric: She has a normal mood and affect.  Nursing note and vitals reviewed.   BP 126/82 (BP Location: Left  Arm, Patient Position: Sitting, Cuff Size: Normal)   Pulse 70   Temp (!) 97.4 F (36.3 C) (Oral)   Resp 18   Wt 247 lb 3.2 oz (112.1 kg)   SpO2 97%   BMI 46.71 kg/m  Wt Readings from Last 3 Encounters:  03/03/17 247 lb 3.2 oz (112.1 kg)  02/23/17 243 lb (110.2 kg)  02/15/17 248 lb 6 oz (112.7 kg)   BP Readings from Last 3 Encounters:  03/03/17 126/82  02/23/17 126/64  02/15/17 128/82     Immunization History  Administered Date(s) Administered  . Influenza, High Dose Seasonal PF 11/09/2015  . Influenza-Unspecified 09/07/2012, 10/09/2014, 11/03/2016  . Pneumococcal Conjugate-13 11/09/2015  . Pneumococcal Polysaccharide-23 03/03/2017  . Td 11/09/2015  . Zoster 02/08/2012    Health Maintenance  Topic Date Due  . COLONOSCOPY  11/15/2017  . TETANUS/TDAP  11/08/2025  . INFLUENZA VACCINE  Completed  . DEXA SCAN  Completed  . PNA vac Low Risk Adult  Completed    Lab Results  Component Value Date   WBC 8.1 03/03/2017   HGB 13.7 03/03/2017   HCT 41.8 03/03/2017   PLT 315.0 03/03/2017   GLUCOSE 95 03/03/2017   CHOL 208 (H) 03/03/2017   TRIG 97.0 03/03/2017   HDL 65.50 03/03/2017   LDLDIRECT 115.0 10/27/2016   LDLCALC 123 (H) 03/03/2017   ALT 11 03/03/2017   AST 14 03/03/2017   NA 142 03/03/2017   K 4.1 03/03/2017   CL 103 03/03/2017   CREATININE 0.86  03/03/2017   BUN 20 03/03/2017   CO2 28 03/03/2017   TSH 2.81 03/03/2017   HGBA1C 5.9 03/03/2017    Lab Results  Component Value Date   TSH 2.81 03/03/2017   Lab Results  Component Value Date   WBC 8.1 03/03/2017   HGB 13.7 03/03/2017   HCT 41.8 03/03/2017   MCV 94.5 03/03/2017   PLT 315.0 03/03/2017   Lab Results  Component Value Date   NA 142 03/03/2017   K 4.1 03/03/2017   CO2 28 03/03/2017   GLUCOSE 95 03/03/2017   BUN 20 03/03/2017   CREATININE 0.86 03/03/2017   BILITOT 0.3 03/03/2017   ALKPHOS 87 03/03/2017   AST 14 03/03/2017   ALT 11 03/03/2017   PROT 7.1 03/03/2017   ALBUMIN 3.8 03/03/2017   CALCIUM 9.2 03/03/2017   GFR 67.97 03/03/2017   Lab Results  Component Value Date   CHOL 208 (H) 03/03/2017   Lab Results  Component Value Date   HDL 65.50 03/03/2017   Lab Results  Component Value Date   LDLCALC 123 (H) 03/03/2017   Lab Results  Component Value Date   TRIG 97.0 03/03/2017   Lab Results  Component Value Date   CHOLHDL 3 03/03/2017   Lab Results  Component Value Date   HGBA1C 5.9 03/03/2017         Assessment & Plan:   Problem List Items Addressed This Visit    Morbid obesity due to excess calories ([facility])    Encouraged DASH diet, decrease po intake and increase exercise as tolerated. Needs 7-8 hours of sleep nightly. Avoid trans fats, eat small, frequent meals every 4-5 hours with lean proteins, complex carbs and healthy fats. Minimize simple carbs, declines referral       Essential hypertension, benign    Well controlled, no changes to meds. Encouraged heart healthy diet such as the DASH diet and exercise as tolerated.       Relevant Orders  CBC (Completed)   Comprehensive metabolic panel (Completed)   TSH (Completed)   Mixed hyperlipidemia    Encouraged heart healthy diet, increase exercise, avoid trans fats, consider a krill oil cap daily      Hyperglycemia    hgba1c acceptable, minimize simple carbs. Increase exercise  as tolerated.       Relevant Orders   Hemoglobin A1c (Completed)   Gout - Primary    No recent flares      Relevant Orders   Uric acid (Completed)   Cough variant asthma  vs UACS    Did not tolerate the metoprolol but cough is better with treatment of sinusitis found on CT scan. Is encouraged to start Mucinex bid and probiotic daily. Report if cough worsens again.       Recurrent falls    Has had 2 falls over past few months. One was when she was rushing and arose quickly but they other she is unclear about what happened. Carotid ultrasound is unremarkable. She will report if she continues to struggle with falls and would consider PT and/or referral for further evaluation.       Relevant Orders   US Carotid Bilateral (Completed)    Other Visit Diagnoses    Hyperlipidemia, unspecified hyperlipidemia type       Relevant Orders   Lipid panel (Completed)   Disequilibrium       Relevant Orders   US Carotid Bilateral (Completed)      I am having Shawnee Knapp maintain her Red Yeast Rice Extract (RED YEAST RICE PO), aspirin, diltiazem, PREVACID, allopurinol, ICAPS AREDS 2, propranolol, Potassium Chloride ER, furosemide, gabapentin, promethazine-codeine, FLUTTER, and levofloxacin.  No orders of the defined types were placed in this encounter.   CMA served as Education administrator during this visit. History, Physical and Plan performed by medical provider. Documentation and orders reviewed and attested to.  [name], MD

## 2017-03-05 NOTE — Assessment & Plan Note (Signed)
Has had 2 falls over past few months. One was when she was rushing and arose quickly but they other she is unclear about what happened. Carotid ultrasound is unremarkable. She will report if she continues to struggle with falls and would consider PT and/or referral for further evaluation.

## 2017-03-05 NOTE — Assessment & Plan Note (Signed)
Encouraged heart healthy diet, increase exercise, avoid trans fats, consider a krill oil cap daily 

## 2017-03-08 ENCOUNTER — Ambulatory Visit (HOSPITAL_COMMUNITY)
Admission: RE | Admit: 2017-03-08 | Discharge: 2017-03-08 | Disposition: A | Discharge status: Home / Self Care | Payer: Medicare Other | Source: Ambulatory Visit | Attending: Internal Medicine | Admitting: Internal Medicine

## 2017-03-08 DIAGNOSIS — J45991 Cough variant asthma: Secondary | ICD-10-CM | POA: Insufficient documentation

## 2017-03-08 LAB — PULMONARY FUNCTION TEST
FEF 25-75 POST: 1.29 L/s
FEF 25-75 PRE: 0.91 L/s
FEF2575-%CHANGE-POST: 42 %
FEF2575-%PRED-PRE: 65 %
FEF2575-%Pred-Post: 93 %
FEV1-%Change-Post: 12 %
FEV1-%PRED-POST: 74 %
FEV1-%PRED-PRE: 66 %
FEV1-PRE: 1.17 L
FEV1-Post: 1.32 L
FEV1FVC-%CHANGE-POST: 4 %
FEV1FVC-%Pred-Pre: 101 %
FEV6-%Change-Post: 7 %
FEV6-%PRED-POST: 74 %
FEV6-%Pred-Pre: 69 %
FEV6-Post: 1.68 L
FEV6-Pre: 1.56 L
FEV6FVC-%PRED-PRE: 105 %
FEV6FVC-%Pred-Post: 105 %
FVC-%Change-Post: 7 %
FVC-%Pred-Post: 70 %
FVC-%Pred-Pre: 65 %
FVC-POST: 1.68 L
FVC-Pre: 1.56 L
POST FEV1/FVC RATIO: 79 %
POST FEV6/FVC RATIO: 100 %
PRE FEV1/FVC RATIO: 75 %
Pre FEV6/FVC Ratio: 100 %

## 2017-03-08 MED ORDER — ALBUTEROL SULFATE (2.5 MG/3ML) 0.083% IN NEBU
2.5000 mg | INHALATION_SOLUTION | Freq: Once | RESPIRATORY_TRACT | Status: AC
  Administered 2017-03-08: 2.5 mg via RESPIRATORY_TRACT

## 2017-03-08 MED ORDER — METHACHOLINE 1 MG/ML NEB SOLN
2.0000 mL | Freq: Once | RESPIRATORY_TRACT | Status: DC
  Filled 2017-03-08: qty 2

## 2017-03-08 MED ORDER — METHACHOLINE 4 MG/ML NEB SOLN
2.0000 mL | Freq: Once | RESPIRATORY_TRACT | Status: DC
  Filled 2017-03-08: qty 2

## 2017-03-08 MED ORDER — SODIUM CHLORIDE 0.9 % IN NEBU
3.0000 mL | INHALATION_SOLUTION | Freq: Once | RESPIRATORY_TRACT | Status: AC
  Administered 2017-03-08: 3 mL via RESPIRATORY_TRACT
  Filled 2017-03-08: qty 3

## 2017-03-08 MED ORDER — METHACHOLINE 0.25 MG/ML NEB SOLN
2.0000 mL | Freq: Once | RESPIRATORY_TRACT | Status: DC
  Filled 2017-03-08: qty 2

## 2017-03-08 MED ORDER — METHACHOLINE 0.0625 MG/ML NEB SOLN
2.0000 mL | Freq: Once | RESPIRATORY_TRACT | Status: AC
  Administered 2017-03-08: 0.125 mg via RESPIRATORY_TRACT
  Filled 2017-03-08: qty 2

## 2017-03-08 MED ORDER — METHACHOLINE 16 MG/ML NEB SOLN
2.0000 mL | Freq: Once | RESPIRATORY_TRACT | Status: DC
  Filled 2017-03-08: qty 2

## 2017-03-09 ENCOUNTER — Telehealth: Payer: Self-pay | Admitting: Internal Medicine

## 2017-03-09 NOTE — Progress Notes (Signed)
LMTCB

## 2017-03-09 NOTE — Telephone Encounter (Signed)
Call patient : Study is c./ w asthma > No change rx for now but Be sure patient has f/u ov so we can go over all the details of this study and get a plan together moving forward - ok to move up f/u if not feeling better and wants to be seen sooner    Called and  Spoke with pt and she is aware of results per MW.  She stated that she is not feeling better but wanted to think about moving her appt up.  She will call back once she decides on what she wants to do.

## 2017-03-13 ENCOUNTER — Encounter: Payer: Self-pay | Admitting: Internal Medicine

## 2017-03-13 ENCOUNTER — Ambulatory Visit (INDEPENDENT_AMBULATORY_CARE_PROVIDER_SITE_OTHER): Payer: Medicare Other | Admitting: Internal Medicine

## 2017-03-13 VITALS — BP 136/76 | HR 76 | Ht 61.0 in | Wt 246.8 lb

## 2017-03-13 DIAGNOSIS — R05 Cough: Secondary | ICD-10-CM

## 2017-03-13 DIAGNOSIS — B9689 Other specified bacterial agents as the cause of diseases classified elsewhere: Secondary | ICD-10-CM | POA: Diagnosis not present

## 2017-03-13 DIAGNOSIS — J45991 Cough variant asthma: Secondary | ICD-10-CM

## 2017-03-13 DIAGNOSIS — G250 Essential tremor: Secondary | ICD-10-CM

## 2017-03-13 DIAGNOSIS — J208 Acute bronchitis due to other specified organisms: Secondary | ICD-10-CM

## 2017-03-13 DIAGNOSIS — R059 Cough, unspecified: Secondary | ICD-10-CM

## 2017-03-13 MED ORDER — MONTELUKAST SODIUM 10 MG PO TABS: ORAL_TABLET | ORAL | 2 refills | Status: DC

## 2017-03-13 MED ORDER — PROMETHAZINE-CODEINE 6.25-10 MG/5ML PO SYRP: 5.0000 mL | ORAL_SOLUTION | Freq: Four times a day (QID) | ORAL | 0 refills | Status: DC | PRN

## 2017-03-13 NOTE — Assessment & Plan Note (Signed)
Body mass index is 46.63 kg/m.  -  trending up despite no systemic steroids  Lab Results  Component Value Date   TSH 2.81 03/03/2017     Contributing to gerd risk/ doe/reviewed the need and the process to achieve and maintain neg calorie balance > defer f/u primary care including intermittently monitoring thyroid status

## 2017-03-13 NOTE — Assessment & Plan Note (Addendum)
Allergy profile  10/06/16  >  Eos 0.2 /  IgE  4  RAST neg  Trial off propranolol 10/06/16 > no change  - 10/06/2016  After extensive coaching HFA effectiveness =    75%  - PFT's  11/28/2016  FEV1 1.54 (86 % ) ratio 77  p no % improvement from saba p nothing prior to study with DLCO  71/75  % corrects to 97  % for alv volume with mild exp curvature  - trial of gabapentin 100 tid 11/28/2016 > improved until uri > 01/12/2017 increase to 300 tid  - FENO 02/23/2017  =   Could not do  - Spirometry 02/23/2017  FEV1 1.32 (75%)  Ratio 72   - .flutter valve training 02/23/2017  - Sinus CT 02/24/2017 > Left ethmoid and left maxillary sinus mucosal thickening and fluid suggesting acute sinusitis >rx omnicef 300 mg twice daily x 10 days since amox reaction only a rash - MCT 03/08/17 POS reversible airflow obst > reproduced symptoms   - singuair trial 03/13/2017  - repeat sinus CT  03/13/2017 >>>   03/13/2017  After extensive coaching inhaler device  effectiveness =    90%     Chronic cough is often simultaneously caused by more than one condition. A single cause has been found from 38 to 82% of the time, multiple causes from 18 to 62%. Multiply caused cough has been the result of three diseases up to 42% of the time.   Suspect this is multifactorial cough so needds  1) new tremor drug > off inderal indef if at all possible  2) singulair trial then consider adding symbicort low dose next stedp   3) repeat sinus CT and ent eval if not better   I had an extended discussion with the patient/husband reviewing all relevant studies completed to date and  lasting 15 to 20 minutes of a 25 minute visit    Each maintenance medication was reviewed in detail including most importantly the difference between maintenance and prns and under what circumstances the prns are to be triggered using an action plan format that is not reflected in the computer generated alphabetically organized AVS.    Please see AVS for specific  instructions unique to this visit that I personally wrote and verbalized to the the pt in detail and then reviewed with pt  by my nurse highlighting any  changes in therapy recommended at today's visit to their plan of care.

## 2017-03-13 NOTE — Progress Notes (Signed)
Subjective:     Patient ID: Amanda Copeland, female   DOB: [birth year],    MRN: [id number]    Brief patient profile:  77 yowf originally from [place] never smoker dx as asthma age 77 through HS on prns with tendency to bronchitis pretty much every winter in her 77s s antecedent uri's with progressive doe x 2017 so referred to pulmonary clinic 10/06/2016 by Dr   Nani Ravens.     History of Present Illness  10/06/2016 1st [facility] office visit/ Jarrell Armond  On inderal 20 bid /saba/pulmocort / prevacid with bfast Chief Complaint  Patient presents with  . Pulmonary Consult    Referred by Dr. Riki Sheer. Pt c/o SOB for the past year, progressively worsening. She states she gets out of breath just walking short distances such as from lobby to our closest exam room today. She is also winded when she first lies down at night. She also c/o non prod cough. She has pulmicort inhaler that she uses occ- maybe 4-5 x per wk on average and then she uses proair 1-2 x per wk on average.   here in [place] x 2016 moved into brand new house on arrival and continued to have "same bronchitis each winter" with onset doe  separate from episodes of  bronchitis with poor control of sob dating back to 11/2015 on inderol 20 mg bid x 4 years [facility] Cough comes and goes mostly during the day / no noct p asleep but also some coughing right at hs mostly dry  Last time did HT hanging on the cart  No better with saba  rec Prevacid 30 mg Take 30- 60 min before your first and last meals of the day   Stop inderal and replace with lopressor 25 mg twice daily  GERD  Diet        11/28/2016  f/u ov/Piers Baade re: unexplained sob /cough x one year  Chief Complaint  Patient presents with  . Follow-up    PFT's done today. Her breathing is unchanged. She has not used her albuterol.   still coughing most days totally sporadic but not p hs  Doe = MMRC2 = can't walk a nl pace on a flat grade s sob but does fine slow and flat eg HT   Tried pulmocort x sev weeks no better Off albuterol no worse, back on inderal due to uncontrolled tremor >  tremor better,cough no worse  Using fish oil daily though included in trial gerd diet to eliminate  rec GERD   Gabapentin 100 mg three times a day and stop singulair now     01/12/2017 acute extended  ov/Merrel Crabbe re: cough variant asthma vs uacs  Chief Complaint  Patient presents with  . Acute Visit    Increased SOB and cough for the past wk.  Cough has been non prod.   all symptoms improved  on gabapentin / drove to [place] then felt caught cold / same as husband  Cough is non-productive and 24/7 but took phenergan with codeine noct prior to ov , slept fine s noct cough or am flare but still feels something is stuck in her throat since onset of "cold" rec Increase  Gabapentin to 300 mg three times a day with meals  If still coughing > take your codeine cough medication up to every 4 hours as needed but should gradually improve to point where you don't need it Prevacid 30 mg Take 30- 60 min before your first and last meals  of the day  Depomedrol 120 mg IM today     02/23/2017  f/u ov/Azalie Harbeck re: uacs / sob x 11/2015  Chief Complaint  Patient presents with  . Follow-up    Increased cough x 2 wks- mainly non prod, but she did produce a min amount of pale yellow sputum this am.   one week prior to flare not needing any codeine at all and breathing had improved and had no reproducible doe at last in office walk Then flare  x 2 weeks prior to OV  p exposed to sick fm member, husband also caught same cold   Then one week pred shot/ zpak  > improved back to baseline cough and doe x MMRC2 = can't walk a nl pace on a flat grade s sob but does fine slow and flat  But also sob at rest during coughing fits rec For drainage / throat tickle try take CHLORPHENIRAMINE  4 mg - take one every 4 hours as needed - available over the counter- may cause drowsiness so start with just a bedtime dose  or two and see how you tolerate it before trying in daytime   For cough/ congestion > use flutter valve as much  as possible  schedule sinus CT> pos sinusitis rx omnicef x 10 days  and methacholine challenge test > pos asthma on inderal    03/13/2017  f/u ov/Margrete Delude re:  Chief Complaint  Patient presents with  . Follow-up    Cough has not improved "may be getting worse"- mainly non prod but occ produces some pale yellow sputum.  She is using her proair 1-2 x per wk.   mct reproduced her symptoms "completely" but p albuterol "antedote"  only a little better  Never got the h1 as rec  Better with phenergan with codeine at hs than any other rx Worse with voice use and after supper despite ppi ac bid  Not consistently better with pred Doe = MMRC2 = can't walk a nl pace on a flat grade s sob but does fine slow and flat      No obvious day to day or daytime variability or assoc mucus plugs or hemoptysis or cp or chest tightness, subjective wheeze or overt sinus or hb symptoms. No unusual exposure hx or h/o childhood pna/ asthma or knowledge of premature birth.  Sleeping ok flat without nocturnal  or early am exacerbation  of respiratory  c/o's or need for noct saba. Also denies any obvious fluctuation of symptoms with weather or environmental changes or other aggravating or alleviating factors except as outlined above   Current Allergies, Complete Past Medical History, Past Surgical History, Family History, and Social History were reviewed in [employer] record.  ROS  The following are not active complaints unless bolded Hoarseness, sore throat, dysphagia, dental problems, itching, sneezing,  nasal congestion or discharge of excess mucus or purulent secretions, ear ache,   fever, chills, sweats, unintended wt loss or wt gain, classically pleuritic or exertional cp,  orthopnea pnd or leg swelling, presyncope, palpitations, abdominal pain, anorexia, nausea, vomiting, diarrhea  or  change in bowel habits or change in bladder habits, change in stools or change in urine, dysuria, hematuria,  rash, arthralgias, visual complaints, headache, numbness, weakness or ataxia or problems with walking or coordination,  change in mood/affect or memory.        Current Meds  Medication Sig  . albuterol (PROAIR HFA) 108 (90 Base) MCG/ACT inhaler Inhale 2 puffs into  the lungs every 6 (six) hours as needed for wheezing or shortness of breath.  . allopurinol (ZYLOPRIM) 100 MG tablet Take 1 tablet (100 mg total) by mouth daily.  Marland Kitchen aspirin 81 MG tablet Take 81 mg by mouth daily.  Marland Kitchen diltiazem (TIAZAC) 180 MG 24 hr capsule Take 1 capsule (180 mg total) by mouth daily.  . furosemide (LASIX) 40 MG tablet Take 1 tablet (40 mg total) daily by mouth.  . gabapentin (NEURONTIN) 300 MG capsule Take 1 capsule (300 mg total) by mouth 3 (three) times daily.  . Multiple Vitamins-Minerals (ICAPS AREDS 2) CAPS Take 1 capsule by mouth 2 (two) times daily.  . Potassium Chloride ER 20 MEQ TBCR Take 20 mEq daily by mouth.  Marland Kitchen PREVACID 30 MG capsule TAKE ONE CAPSULE BY MOUTH TWICE A DAY  . promethazine-codeine (PHENERGAN WITH CODEINE) 6.25-10 MG/5ML syrup Take 5 mLs by mouth every 6 (six) hours as needed for cough.  . propranolol (INDERAL) 20 MG tablet Take 1 tablet (20 mg total) by mouth 2 (two) times daily.  . Red Yeast Rice Extract (RED YEAST RICE PO) Take by mouth daily.  Marland Kitchen Respiratory Therapy Supplies (FLUTTER) DEVI Use as directed  . [DISCONTINUED] promethazine-codeine (PHENERGAN WITH CODEINE) 6.25-10 MG/5ML syrup Take 5 mLs by mouth every 6 (six) hours as needed for cough.           .                   Objective:   Physical Exam    03/13/2017         246  02/23/2017       243  01/12/2017       252  11/28/2016     245    10/06/16 247 lb (112 kg)  08/22/16 246 lb 9.6 oz (111.9 kg)  05/19/16 241 lb (109.3 kg)     amb obese wf nad  Vital signs reviewed - Note on arrival 02 sats  97% on  RA  HEENT: nl dentition, turbinates bilaterally, and oropharynx. Nl external ear canals without cough reflex   NECK :  without JVD/Nodes/TM/ nl carotid upstrokes bilaterally   LUNGS: no acc muscle use,  Nl contour chest which is clear to A and P bilaterally without cough on insp or exp maneuvers   CV:  RRR  no s3 or murmur or increase in P2, and no edema   ABD:  soft and nontender with nl inspiratory excursion in the supine position. No bruits or organomegaly appreciated, bowel sounds nl  MS:  Nl gait/ ext warm without deformities, calf tenderness, cyanosis or clubbing No obvious joint restrictions   SKIN: warm and dry without lesions    NEURO:  alert, approp, nl sensorium with  no motor or cerebellar deficits apparent.                    Assessment:

## 2017-03-13 NOTE — Assessment & Plan Note (Signed)
Needs substititute for inderal if possible or titrate to lowest effective dose

## 2017-03-13 NOTE — Patient Instructions (Addendum)
You tested positive for asthma and will need to get off of inderal if possible or taper it to the lowest possible dose  Plan A = Automatic = singulair 10 mg one each evening   Plan B = Backup for breathing  Only use your albuterol (proair)  as a rescue medication to be used if you can't catch your breath by resting or doing a relaxed purse lip breathing pattern.  - The less you use it, the better it will work when you need it. - Ok to use the inhaler up to 2 puffs  every 4 hours if you must but call for appointment if use goes up over your usual need - Don't leave home without it !!  (think of it like the spare tire for your car)    Please see patient coordinator before you leave today  to schedule sinus CT    Please schedule a follow up office visit in 3 weeks, sooner if needed  with all medications /inhalers/ solutions in hand so we can verify exactly what you are taking. This includes all medications from all doctors and over the counters

## 2017-03-14 ENCOUNTER — Ambulatory Visit (HOSPITAL_BASED_OUTPATIENT_CLINIC_OR_DEPARTMENT_OTHER)
Admission: RE | Admit: 2017-03-14 | Discharge: 2017-03-14 | Disposition: A | Discharge status: Home / Self Care | Payer: Medicare Other | Source: Ambulatory Visit | Attending: Internal Medicine | Admitting: Internal Medicine

## 2017-03-14 ENCOUNTER — Telehealth: Payer: Self-pay | Admitting: Internal Medicine

## 2017-03-14 DIAGNOSIS — J0120 Acute ethmoidal sinusitis, unspecified: Secondary | ICD-10-CM | POA: Diagnosis not present

## 2017-03-14 DIAGNOSIS — J322 Chronic ethmoidal sinusitis: Secondary | ICD-10-CM | POA: Insufficient documentation

## 2017-03-14 DIAGNOSIS — J45991 Cough variant asthma: Secondary | ICD-10-CM | POA: Diagnosis present

## 2017-03-14 DIAGNOSIS — R05 Cough: Secondary | ICD-10-CM

## 2017-03-14 DIAGNOSIS — R059 Cough, unspecified: Secondary | ICD-10-CM

## 2017-03-14 NOTE — Progress Notes (Signed)
Subjective:   Amanda Copeland was seen in consultation in the movement disorder clinic at the request of [name], MD.  The evaluation is for tremor.  This patient is accompanied in the office by her spouse who supplements the history.   The patient is a 77 y.o. right handed female with a history of tremor.  The patient reports that she has had tremor in both hands for the last several years.  She went to a neurologist in [place] and was diagnosed with ET and was put on propranolol, 20 mg bid and it helped a lot without side effects.  There is a family hx of tremor in her father but he has PD.  There is no other family hx of tremor    Affected by caffeine:  No. (coffee is only decaf) Affected by alcohol:  unknown Affected by stress:  No. Affected by fatigue:  No. Spills soup if on spoon:  No. (not now that she is on the propranolol) Spills glass of liquid if full:  No. Affects ADL's (tying shoes, brushing teeth, etc):  No.   Outside reports reviewed: historical medical records.  10/09/15 update:  Pt returns for f/u re: ET.  The records that were made available to me were reviewed.   Reports that she is doing well on propranolol 20 mg bid.  Rarely she will forget the night pill and "i can tell in the AM."  No SE with medication.  She is under tons of stress.  Her son in law was dx with a bone cancer and he is getting aggressive chemo and needs an amputation.  Her mother in law is dying in [place].  She is helping to take care of the grandchildren.    04/11/16 update:  Patient returns today for follow-up.  She is on propranolol, 20 mg twice per day.  overall, she states that her tremor has been well controlled.  she denies falls.  denies lightheadedness or near syncope.  No new medical problems.  She states that she can tell if she skips medication.    10/12/16 update:  Patient seen today in follow-up for her essential tremor.  She was on propranolol, 20 mg twice per day but she saw Dr. Melvyn Novas  and states that she was changed to metoprolol instead (to see if her asthma would be better).  She is now on metoprolol 25 mg bid.  She thinks that tremor may be "coming back a little."   Breathing hasn't changed since making the change. I have reviewed records since last visit.  She has been to rehabilitation for low back pain.  She has followed up with Dr. Melvyn Novas in regards to asthma, as above.   She does ask if she needs all 3 blood pressure meds.  She denies hx of CHF or LE edema.    03/16/17 update: Patient is seen today in follow-up.  I have reviewed records and corresponded with her pulmonologist regarding her case.  Dr. Melvyn Novas asked me to take her off of propranolol.  Initially, was somewhat confused as she was off of propranolol last visit, but it turns out she went back on the medication and it is felt that this is worsening her asthma, as evidenced by PFTs.  Had 2 falls since last visit.  With one she got up in the middle of the night and felt lightheaded and then fell.  With the other she was at movies and was coming down stairs and fell on her  butt.  Current/Previously tried tremor medications: propranolol (felt worsened asthma); metoprolol (didn't help)  Current medications that may exacerbate tremor:  Albuterol (doesn't notice that it affects tremor); symbicort  Allergies  Allergen Reactions  . Amoxicillin Rash  . Ampicillin Rash  . Penicillins Rash  . Statins Other (See Comments)    Per reports muscle aches and joint pain    Outpatient Encounter Medications as of 03/16/2017  Medication Sig  . albuterol (PROAIR HFA) 108 (90 Base) MCG/ACT inhaler Inhale 2 puffs into the lungs every 6 (six) hours as needed for wheezing or shortness of breath.  . allopurinol (ZYLOPRIM) 100 MG tablet Take 1 tablet (100 mg total) by mouth daily.  Marland Kitchen aspirin 81 MG tablet Take 81 mg by mouth daily.  Marland Kitchen diltiazem (TIAZAC) 180 MG 24 hr capsule Take 1 capsule (180 mg total) by mouth daily.  . furosemide (LASIX) 40  MG tablet Take 1 tablet (40 mg total) daily by mouth.  . gabapentin (NEURONTIN) 300 MG capsule Take 1 capsule (300 mg total) by mouth 3 (three) times daily.  . montelukast (SINGULAIR) 10 MG tablet One at bedtime every night  . Multiple Vitamins-Minerals (ICAPS AREDS 2) CAPS Take 1 capsule by mouth 2 (two) times daily.  . Potassium Chloride ER 20 MEQ TBCR Take 20 mEq daily by mouth.  Marland Kitchen PREVACID 30 MG capsule TAKE ONE CAPSULE BY MOUTH TWICE A DAY  . propranolol (INDERAL) 20 MG tablet Take 1 tablet (20 mg total) by mouth 2 (two) times daily.  . Red Yeast Rice Extract (RED YEAST RICE PO) Take by mouth daily.  . [DISCONTINUED] promethazine-codeine (PHENERGAN WITH CODEINE) 6.25-10 MG/5ML syrup Take 5 mLs by mouth every 6 (six) hours as needed for cough.  . [DISCONTINUED] Respiratory Therapy Supplies (FLUTTER) DEVI Use as directed   No facility-administered encounter medications on file as of 03/16/2017.     Past Medical History:  Diagnosis Date  . Arthritis   . Asthma    childhood now returning  . Back pain affecting pregnancy 11/02/2014  . Benign essential tremor 10/28/2014  . Bilateral dry eyes   . Cancer (HCC)    Breast  . Dry eyes   . Encounter for Medicare annual wellness exam 05/22/2016  . Essential hypertension, benign 10/28/2014  . H/O measles   . H/O mumps   . History of chicken pox   . Lumbago 11/02/2014  . Mixed hyperlipidemia 10/28/2014  . Obesity 10/28/2014    Past Surgical History:  Procedure Laterality Date  . BACK SURGERY    . BREAST REDUCTION SURGERY Left   . CHOLECYSTECTOMY    . EYE SURGERY  2008   b/l cataracts removed, in [place]  . MASTECTOMY Right   . REPLACEMENT TOTAL KNEE BILATERAL Bilateral   . ROTATOR CUFF REPAIR Left     Social History   Socioeconomic History  . Marital status: Married    Spouse name: Not on file  . Number of children: Not on file  . Years of education: Not on file  . Highest education level: Not on file  Social Needs  . Financial  resource strain: Not on file  . Food insecurity - worry: Not on file  . Food insecurity - inability: Not on file  . Transportation needs - medical: Not on file  . Transportation needs - non-medical: Not on file  Occupational History  . 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 Smoking status: Never Smoker  .  Smokeless tobacco: Never Used  Substance and Sexual Activity  . Alcohol use: Yes    Alcohol/week: 0.0 oz    Comment: two times a month  . Drug use: No  . Sexual activity: Not on file    Comment: lives with husband, moved [place], no dietary restrictions, retired Education officer, museum  Other Topics Concern  . Not on file  Social History Narrative  . Not on file    Family Status  Relation Name Status  . Mother 21 Deceased       heart disease  . Father 35 Deceased       colon cancer, Parkinson's Disease  . MGM 65ish Deceased  . MGF 45ish Deceased  . Daughter 63 Alive  . PGM 40ish Deceased  . PGF 40ish,accident Deceased  . Neg Hx  (Not Specified)    Review of Systems SOB with asthma.  A complete 10 system ROS was obtained and was negative apart from what is mentioned.   Objective:   VITALS:   Vitals:   03/16/17 1252  BP: 108/62  Pulse: 66  SpO2: 94%  Weight: 248 lb (112.5 kg)  Height: 5\' 1"  (1.549 m)   Gen:  Appears stated age and in NAD. HEENT:  Normocephalic, atraumatic. The mucous membranes are moist. The superficial temporal arteries are without ropiness or tenderness. Cardiovascular: bradycardic.  Regular. Lungs: Clear to auscultation bilaterally. Neck: There are no carotid bruits noted bilaterally.  NEUROLOGICAL:  Orientation:  The patient is alert and oriented x 3.   Cranial nerves: There is good facial symmetry. Extraocular muscles are intact and visual fields are full to confrontational testing. Speech is fluent and clear. Soft palate rises symmetrically and there is no tongue deviation. Hearing is intact to conversational  tone. Tone: Tone is good throughout. Sensation: Sensation is intact to light touch throughout Coordination:  The patient has no dysdiadichokinesia but has some trouble with FNF on the right with eyes closed but not with eyes open Motor: Strength is 5/5 in the bilateral upper and lower extremities.  Shoulder shrug is equal bilaterally.  There is no pronator drift.  There are no fasciculations noted. Gait and Station: The patient is able to ambulate without difficulty.  The patient is slightly wide based  MOVEMENT EXAM: Tremor:  There is no tremor of outstretched hands.  There is minimal tremor with intention.  Minimal tremor with archimedes spirals.  Some trouble with pouring water from one glass to another.       Assessment/Plan:   1.  Essential Tremor.  -Dr. Melvyn Novas noted worsening of asthma on propranolol and will d/c.    -start primidone, 50 q hs.  Risks, benefits, side effects and alternative therapies were discussed.  The opportunity to ask questions was given and they were answered to the best of my ability.  The patient expressed understanding and willingness to follow the outlined treatment protocols. 2.  Hx of hypertension  -watch BP as decreasing propranolol 3.  Follow up 05/08/17.  She already has an appointment scheduled.  Much greater than 50% of this visit was spent in counseling and coordinating care.  Total face to face time:  25 min

## 2017-03-14 NOTE — Telephone Encounter (Signed)
Called and spoke with patient she is aware of results and verbalized understanding.  

## 2017-03-16 ENCOUNTER — Ambulatory Visit (INDEPENDENT_AMBULATORY_CARE_PROVIDER_SITE_OTHER): Payer: Medicare Other | Admitting: Neurology

## 2017-03-16 ENCOUNTER — Telehealth: Payer: Self-pay | Admitting: Neurology

## 2017-03-16 ENCOUNTER — Encounter: Payer: Self-pay | Admitting: Neurology

## 2017-03-16 VITALS — BP 108/62 | HR 66 | Ht 61.0 in | Wt 248.0 lb

## 2017-03-16 DIAGNOSIS — G250 Essential tremor: Secondary | ICD-10-CM | POA: Diagnosis not present

## 2017-03-16 DIAGNOSIS — R296 Repeated falls: Secondary | ICD-10-CM

## 2017-03-16 MED ORDER — PRIMIDONE 50 MG PO TABS: 50.0000 mg | ORAL_TABLET | Freq: Every day | ORAL | 1 refills | Status: DC

## 2017-03-16 NOTE — Patient Instructions (Signed)
1.  Decrease propranolol 20 mg once per day for a week and then stop it 2.  Start primidone - 50mg  - 1/2 tablet at night for 4 nights and then increase to 1 tablet at night.

## 2017-03-16 NOTE — Telephone Encounter (Signed)
Patient made aware Primidone RX sent for #90. Informed her to contact her pharmacy to discuss.

## 2017-03-16 NOTE — Telephone Encounter (Signed)
Patient called and states that we were calling in a medication (she does not know what ) to [facility] on [address] for 90 pills but they only got one for 30 pills and she wants one for 90 pills

## 2017-03-17 ENCOUNTER — Telehealth: Payer: Self-pay | Admitting: Internal Medicine

## 2017-03-17 DIAGNOSIS — J320 Chronic maxillary sinusitis: Secondary | ICD-10-CM

## 2017-03-17 NOTE — Telephone Encounter (Signed)
Notes recorded by Tanda Rockers, MD on 03/14/2017 at 1:01 PM EST Call patient : Study is somewhat improved but still abnormally thickened max sinus c/w sinusitis > rec ent next, [facility] -------  Pt called stating she has not yet heard back regarding ENT referral. Per chart the referral was never placed as recommended by MW. Referral placed.  Nothing further needed.

## 2017-03-18 ENCOUNTER — Other Ambulatory Visit: Payer: Self-pay | Admitting: Family Medicine

## 2017-03-21 DIAGNOSIS — H6122 Impacted cerumen, left ear: Secondary | ICD-10-CM | POA: Diagnosis not present

## 2017-03-21 DIAGNOSIS — J343 Hypertrophy of nasal turbinates: Secondary | ICD-10-CM | POA: Diagnosis not present

## 2017-03-23 ENCOUNTER — Ambulatory Visit: Payer: Medicare Other | Admitting: Internal Medicine

## 2017-04-03 ENCOUNTER — Encounter: Payer: Self-pay | Admitting: Internal Medicine

## 2017-04-03 ENCOUNTER — Ambulatory Visit (INDEPENDENT_AMBULATORY_CARE_PROVIDER_SITE_OTHER): Payer: Medicare Other | Admitting: Internal Medicine

## 2017-04-03 VITALS — BP 124/68 | HR 96 | Ht 62.0 in | Wt 248.0 lb

## 2017-04-03 DIAGNOSIS — J45991 Cough variant asthma: Secondary | ICD-10-CM

## 2017-04-03 DIAGNOSIS — G250 Essential tremor: Secondary | ICD-10-CM | POA: Diagnosis not present

## 2017-04-03 NOTE — Patient Instructions (Signed)
Let Dr Tat adjust your mysoline or alternative to highest dose feasible then perhaps add back very low doses of propranolol to tolerable levels of tremors   Please schedule a follow up visit in 3 months but call sooner if needed

## 2017-04-03 NOTE — Progress Notes (Signed)
Subjective:     Patient ID: Amanda Copeland, female   DOB: June 06, 1940,    MRN: [id number]    Brief patient profile:  77 yowf originally from [place] never smoker dx as asthma age 77 through HS on prns with tendency to bronchitis pretty much every winter in her 65s s antecedent uri's with progressive doe x 2017 so referred to pulmonary clinic 10/06/2016 by Dr   Nani Ravens.     History of Present Illness  10/06/2016 1st [facility] office visit/ Didi Ganaway  On inderal 20 bid /saba/pulmocort / prevacid with bfast Chief Complaint  Patient presents with  . Pulmonary Consult    Referred by Dr. Riki Sheer. Pt c/o SOB for the past year, progressively worsening. She states she gets out of breath just walking short distances such as from lobby to our closest exam room today. She is also winded when she first lies down at night. She also c/o non prod cough. She has pulmicort inhaler that she uses occ- maybe 4-5 x per wk on average and then she uses proair 1-2 x per wk on average.   here in [place] x 2016 moved into brand new house on arrival and continued to have "same bronchitis each winter" with onset doe  separate from episodes of  bronchitis with poor control of sob dating back to 11/2015 on inderol 20 mg bid x 4 years [facility] Cough comes and goes mostly during the day / no noct p asleep but also some coughing right at hs mostly dry  Last time did HT hanging on the cart  No better with saba  rec Prevacid 30 mg Take 30- 60 min before your first and last meals of the day   Stop inderal and replace with lopressor 25 mg twice daily  GERD  Diet        11/28/2016  f/u ov/Lynanne Delgreco re: unexplained sob /cough x one year  Chief Complaint  Patient presents with  . Follow-up    PFT's done today. Her breathing is unchanged. She has not used her albuterol.   still coughing most days totally sporadic but not p hs  Doe = MMRC2 = can't walk a nl pace on a flat grade s sob but does fine slow and flat eg HT   Tried pulmocort x sev weeks no better Off albuterol no worse, back on inderal due to uncontrolled tremor >  tremor better,cough no worse  Using fish oil daily though included in trial gerd diet to eliminate  rec GERD   Gabapentin 100 mg three times a day and stop singulair now     01/12/2017 acute extended  ov/Ardath Lepak re: cough variant asthma vs uacs  Chief Complaint  Patient presents with  . Acute Visit    Increased SOB and cough for the past wk.  Cough has been non prod.   all symptoms improved  on gabapentin / drove to [place] then felt caught cold / same as husband  Cough is non-productive and 24/7 but took phenergan with codeine noct prior to ov , slept fine s noct cough or am flare but still feels something is stuck in her throat since onset of "cold" rec Increase  Gabapentin to 300 mg three times a day with meals  If still coughing > take your codeine cough medication up to every 4 hours as needed but should gradually improve to point where you don't need it Prevacid 30 mg Take 30- 60 min before your first and last meals  of the day  Depomedrol 120 mg IM today     02/23/2017  f/u ov/John Vasconcelos re: uacs / sob x 11/2015  Chief Complaint  Patient presents with  . Follow-up    Increased cough x 2 wks- mainly non prod, but she did produce a min amount of pale yellow sputum this am.   one week prior to flare not needing any codeine at all and breathing had improved and had no reproducible doe at last in office walk Then flare  x 2 weeks prior to OV  p exposed to sick fm member, husband also caught same cold   Then one week pred shot/ zpak  > improved back to baseline cough and doe x MMRC2 = can't walk a nl pace on a flat grade s sob but does fine slow and flat  But also sob at rest during coughing fits rec For drainage / throat tickle try take CHLORPHENIRAMINE  4 mg - take one every 4 hours as needed - available over the counter- may cause drowsiness so start with just a bedtime dose  or two and see how you tolerate it before trying in daytime   For cough/ congestion > use flutter valve as much  as possible  schedule sinus CT> pos sinusitis rx omnicef x 10 days  and methacholine challenge test > pos asthma on inderal    03/13/2017  f/u ov/Laurabelle Gorczyca re:  Chief Complaint  Patient presents with  . Follow-up    Cough has not improved "may be getting worse"- mainly non prod but occ produces some pale yellow sputum.  She is using her proair 1-2 x per wk.   mct reproduced her symptoms "completely" but p albuterol "antedote"  only a little better  Never got the h1 as rec  Better with phenergan with codeine at hs than any other rx Worse with voice use and after supper despite ppi ac bid  Not consistently better with pred Doe = MMRC2 = can't walk a nl pace on a flat grade s sob but does fine slow and flat   rec You tested positive for asthma and will need to get off of inderal if possible or taper it to the lowest possible dose Plan A = Automatic = singulair 10 mg one each evening  Plan B = Backup for breathing  Only use your albuterol (proair)    Please see patient coordinator before you leave today  to schedule sinus CT  Please schedule a follow up office visit in 3 weeks, sooner if needed  with all medications /inhalers/ solutions in hand so we can verify exactly what you are taking. This includes all medications from all doctors and over the counters    04/03/2017  f/u ov/Jhaden Pizzuto re: asthma better/  tremor worse / no cough  Chief Complaint  Patient presents with  . Follow-up    Breathing is doing well and she is no longer coughing.   Dyspnea:  Best she's done with breathing=  MMRC1 = can walk nl pace, flat grade, can't hurry or go uphills or steps s sob   Cough: gone  Sleep: well SABA use: none needed  No obvious day to day or daytime variability or assoc excess/ purulent sputum or mucus plugs or hemoptysis or cp or chest tightness, subjective wheeze or overt sinus or hb  symptoms. No unusual exposure hx or h/o childhood pna/ asthma or knowledge of premature birth.  Sleeping ok flat without nocturnal  or early am exacerbation  of respiratory  c/o's or need for noct saba. Also denies any obvious fluctuation of symptoms with weather or environmental changes or other aggravating or alleviating factors except as outlined above   Current Allergies, Complete Past Medical History, Past Surgical History, Family History, and Social History were reviewed in [employer] record.  ROS  The following are not active complaints unless bolded Hoarseness, sore throat, dysphagia, dental problems, itching, sneezing,  nasal congestion or discharge of excess mucus or purulent secretions, ear ache,   fever, chills, sweats, unintended wt loss or wt gain, classically pleuritic or exertional cp,  orthopnea pnd or leg swelling, presyncope, palpitations, abdominal pain, anorexia, nausea, vomiting, diarrhea  or change in bowel habits or change in bladder habits, change in stools or change in urine, dysuria, hematuria,  rash, arthralgias, visual complaints, headache, numbness, weakness or ataxia or problems with walking or coordination,  change in mood/affect or memory.          Current Meds  Medication Sig  . albuterol (PROAIR HFA) 108 (90 Base) MCG/ACT inhaler Inhale 2 puffs into the lungs every 6 (six) hours as needed for wheezing or shortness of breath.  . allopurinol (ZYLOPRIM) 100 MG tablet Take 1 tablet (100 mg total) by mouth daily.  Marland Kitchen aspirin 81 MG tablet Take 81 mg by mouth daily.  Marland Kitchen diltiazem (TIAZAC) 180 MG 24 hr capsule Take 1 capsule (180 mg total) by mouth daily.  Marland Kitchen gabapentin (NEURONTIN) 300 MG capsule Take 1 capsule (300 mg total) by mouth 3 (three) times daily.  . montelukast (SINGULAIR) 10 MG tablet One at bedtime every night  . Multiple Vitamins-Minerals (ICAPS AREDS 2) CAPS Take 1 capsule by mouth 2 (two) times daily.  . Potassium Chloride ER 20 MEQ  TBCR Take 20 mEq daily by mouth.  Marland Kitchen PREVACID 30 MG capsule TAKE ONE CAPSULE BY MOUTH TWICE A DAY  . primidone (MYSOLINE) 50 MG tablet Take 1 tablet (50 mg total) by mouth at bedtime.  . Red Yeast Rice Extract (RED YEAST RICE PO) Take by mouth daily.              Objective:   Physical Exam    04/03/2017         248  03/13/2017         246  02/23/2017       243  01/12/2017       252  11/28/2016     245    10/06/16 247 lb (112 kg)  08/22/16 246 lb 9.6 oz (111.9 kg)  05/19/16 241 lb (109.3 kg)    amb tremulous wf nad   Vital signs reviewed - Note on arrival 02 sats  95% on RA     HEENT: nl dentition, turbinates bilaterally, and oropharynx. Nl external ear canals without cough reflex   NECK :  without JVD/Nodes/TM/ nl carotid upstrokes bilaterally   LUNGS: no acc muscle use,  Nl contour chest which is clear to A and P bilaterally without cough on insp or exp maneuvers   CV:  RRR  no s3 or murmur or increase in P2, and no edema   ABD:  soft and nontender with nl inspiratory excursion in the supine position. No bruits or organomegaly appreciated, bowel sounds nl  MS:  Nl gait/ ext warm without deformities, calf tenderness, cyanosis or clubbing No obvious joint restrictions   SKIN: warm and dry without lesions    NEURO:  alert, approp, nl sensorium with  no motor  deficits apparent - resting tremor slt worse on R         Assessment:

## 2017-04-04 ENCOUNTER — Encounter: Payer: Self-pay | Admitting: Internal Medicine

## 2017-04-04 NOTE — Assessment & Plan Note (Signed)
Worse off inderal but breathing/ cough best it's been in several years with Pos MCT documenting asthma  - ok to try back on very low doses now that she has achieved such good control if can't control it by alternate means > defer to neurology.

## 2017-04-04 NOTE — Assessment & Plan Note (Signed)
Allergy profile  10/06/16  >  Eos 0.2 /  IgE  4  RAST neg  Trial off propranolol 10/06/16 > no change  - 10/06/2016  After extensive coaching HFA effectiveness =    75%  - PFT's  11/28/2016  FEV1 1.54 (86 % ) ratio 77  p no % improvement from saba p nothing prior to study with DLCO  71/75  % corrects to 97  % for alv volume with mild exp curvature  - trial of gabapentin 100 tid 11/28/2016 > improved until uri > 01/12/2017 increase to 300 tid  - FENO 02/23/2017  =   Could not do  - Spirometry 02/23/2017  FEV1 1.32 (75%)  Ratio 72   - .flutter valve training 02/23/2017  - Sinus CT 02/24/2017 > Left ethmoid and left maxillary sinus mucosal thickening and fluid suggesting acute sinusitis >rx omnicef 300 mg twice daily x 10 days since amox reaction only a rash - MCT 03/08/17 POS reversible airflow obst > reproduced symptoms  - singuair trial 03/13/2017  - repeat sinus CT  03/14/2017 > 1. Improved left ethmoid sinusitis when compared to 02/24/2017. 2. Unchanged moderate mucosal thickening in the left maxillary Sinus.> rec   [facility]aker eval > seen 03/21/17 rec flonase only    All goals of chronic asthma control met including optimal function and elimination of symptoms with minimal need for rescue therapy.  Contingencies discussed in full including contacting this office immediately if not controlling the symptoms using the rule of two's.     Doing so well that could consider adding back very low dose inderal if can't control the tremor s BB    I had an extended discussion with the patient reviewing all relevant studies completed to date and  lasting 15 to 20 minutes of a 25 minute visit    Each maintenance medication was reviewed in detail including most importantly the difference between maintenance and prns and under what circumstances the prns are to be triggered using an action plan format that is not reflected in the computer generated alphabetically organized AVS.    Please see AVS for specific  instructions unique to this visit that I personally wrote and verbalized to the the pt in detail and then reviewed with pt  by my nurse highlighting any  changes in therapy recommended at today's visit to their plan of care.

## 2017-04-04 NOTE — Assessment & Plan Note (Signed)
Body mass index is 45.36 kg/m.  -  trending up Lab Results  Component Value Date   TSH 2.81 03/03/2017     Contributing to gerd risk/ doe/reviewed the need and the process to achieve and maintain neg calorie balance > defer f/u primary care including intermittently monitoring thyroid status

## 2017-04-05 ENCOUNTER — Telehealth: Payer: Self-pay | Admitting: Neurology

## 2017-04-05 NOTE — Telephone Encounter (Signed)
Spoke with patient. She was switched last visit from Propranolol to Primidone.   She started Primidone 03/16/17 and is taking 50 mg at night. She thinks since this time tremor has gotten gradually worse. She was slowly stopping Propranolol for a week after that. Medication was stopped due to Dr. Gustavus Bryant request (thinks makes asthma worse).   Please advise.

## 2017-04-05 NOTE — Telephone Encounter (Signed)
Reviewed notes from Dr. Melvyn Novas and Delice Lesch.  Lets try increasing primidone before adding back propranolol.  Increase to 50 mg bid and let her know that this still small dose

## 2017-04-05 NOTE — Telephone Encounter (Signed)
Patient made aware. She will try the increase of Primidone to BID and let us know.

## 2017-04-05 NOTE — Telephone Encounter (Signed)
Patient is having really bad tremors and leaving to go a month long trip tomorrow and needs help. She would like to talk to someone about what to do

## 2017-04-05 NOTE — Telephone Encounter (Signed)
Dr. Melvyn Novas sent a note to Dr. Carles Collet that he is okay with very slow titration back on Inderal if tremor cannot be controlled by other medications. There is room to increase to Primidone. Would she like to try increasing the Primidone first, or go back on lower dose Propranolol, monitoring for worsening asthma? Thanks

## 2017-04-08 ENCOUNTER — Other Ambulatory Visit: Payer: Self-pay | Admitting: Internal Medicine

## 2017-04-08 DIAGNOSIS — J45991 Cough variant asthma: Secondary | ICD-10-CM

## 2017-04-09 ENCOUNTER — Encounter: Payer: Self-pay | Admitting: Neurology

## 2017-04-30 ENCOUNTER — Other Ambulatory Visit: Payer: Self-pay | Admitting: Family Medicine

## 2017-05-03 NOTE — Progress Notes (Signed)
Subjective:   Amanda Copeland was seen in consultation in the movement disorder clinic at the request of [name], MD.  The evaluation is for tremor.  This Amanda Copeland is accompanied in the office by her spouse who supplements the history.   The Amanda Copeland is a 77 y.o. right handed female with a history of tremor.  The Amanda Copeland reports that she has had tremor in both hands for the last several years.  She went to a neurologist in [place] and was diagnosed with ET and was put on propranolol, 20 mg bid and it helped a lot without side effects.  There is a family hx of tremor in her father but he has PD.  There is no other family hx of tremor    Affected by caffeine:  No. (coffee is only decaf) Affected by alcohol:  unknown Affected by stress:  No. Affected by fatigue:  No. Spills soup if on spoon:  No. (not now that she is on the propranolol) Spills glass of liquid if full:  No. Affects ADL's (tying shoes, brushing teeth, etc):  No.   Outside reports reviewed: historical medical records.  10/09/15 update:  Pt returns for f/u re: ET.  The records that were made available to me were reviewed.   Reports that she is doing well on propranolol 20 mg bid.  Rarely she will forget the night pill and "i can tell in the AM."  No SE with medication.  She is under tons of stress.  Her son in law was dx with a bone cancer and he is getting aggressive chemo and needs an amputation.  Her mother in law is dying in [place].  She is helping to take care of the grandchildren.    04/11/16 update:  Amanda Copeland returns today for follow-up.  She is on propranolol, 20 mg twice per day.  overall, she states that her tremor has been well controlled.  she denies falls.  denies lightheadedness or near syncope.  No new medical problems.  She states that she can tell if she skips medication.    10/12/16 update:  Amanda Copeland seen today in follow-up for her essential tremor.  She was on propranolol, 20 mg twice per day but she saw Dr. Melvyn Novas  and states that she was changed to metoprolol instead (to see if her asthma would be better).  She is now on metoprolol 25 mg bid.  She thinks that tremor may be "coming back a little."   Breathing hasn't changed since making the change. I have reviewed records since last visit.  She has been to rehabilitation for low back pain.  She has followed up with Dr. Melvyn Novas in regards to asthma, as above.   She does ask if she needs all 3 blood pressure meds.  She denies hx of CHF or LE edema.    03/16/17 update: Amanda Copeland is seen today in follow-up.  I have reviewed records and corresponded with her pulmonologist regarding her case.  Dr. Melvyn Novas asked me to take her off of propranolol.  Initially, I was somewhat confused as she was off of propranolol last visit, but it turns out she went back on the medication and it is felt that this is worsening her asthma, as evidenced by PFTs.  Had 2 falls since last visit.  With one she got up in the middle of the night and felt lightheaded and then fell.  With the other she was at movies and was coming down stairs and fell on  her butt.  05/08/17 update: Amanda Copeland seen today in follow-up.  At our last visit, her pulmonologist had recommended discontinuation of her propranolol due to her asthma.  She called after this was done and stated that her tremor was much worse.  Primidone was increased to 50 mg twice per day.  She emailed me about a week later when she was on a cruise and stated that she was not tolerating the primidone well (made her sleep so deeply that she wet the bed).  She was not enjoying the cruise because of it.  She went to go back on the propranolol.  I told her she could go back on it for the cruise.  Reports today that she is still on the propranolol but she didn't know how much, but thinks it is 20 mg bid.  She reports tremor is worse.  States that she isn't coughing and lungs are "fine."   She is already on gabapentin.  She doesn't know why she is on that.  Reports some  twitching and jerking in the legs.  She went off of the primidone when she went off of the propranolol.    Current/Previously tried tremor medications: propranolol (felt worsened asthma); metoprolol (didn't help); primidone (made too sleepy and didn't help at 100 mg daily)  Current medications that may exacerbate tremor:  Albuterol (doesn't notice that it affects tremor); symbicort  Allergies  Allergen Reactions  . Amoxicillin Rash  . Ampicillin Rash  . Penicillins Rash  . Statins Other (See Comments)    Per reports muscle aches and joint pain    Outpatient Encounter Medications as of 05/08/2017  Medication Sig  . albuterol (PROAIR HFA) 108 (90 Base) MCG/ACT inhaler Inhale 2 puffs into the lungs every 6 (six) hours as needed for wheezing or shortness of breath.  . allopurinol (ZYLOPRIM) 100 MG tablet Take 1 tablet (100 mg total) by mouth daily.  Marland Kitchen aspirin 81 MG tablet Take 81 mg by mouth daily.  Marland Kitchen diltiazem (TIAZAC) 180 MG 24 hr capsule Take 1 capsule (180 mg total) by mouth daily.  Marland Kitchen gabapentin (NEURONTIN) 300 MG capsule TAKE 1 CAPSULE BY MOUTH THREE TIMES A DAY  . montelukast (SINGULAIR) 10 MG tablet One at bedtime every night  . Multiple Vitamins-Minerals (ICAPS AREDS 2) CAPS Take 1 capsule by mouth 2 (two) times daily.  . Potassium Chloride ER 20 MEQ TBCR Take 20 mEq daily by mouth.  Marland Kitchen PREVACID 30 MG capsule TAKE ONE CAPSULE BY MOUTH TWICE A DAY  . propranolol (INDERAL) 20 MG tablet Take 20 mg by mouth 2 (two) times daily.  . Red Yeast Rice Extract (RED YEAST RICE PO) Take by mouth daily.  . furosemide (LASIX) 40 MG tablet Take 1 tablet (40 mg total) daily by mouth.  . [DISCONTINUED] primidone (MYSOLINE) 50 MG tablet Take 1 tablet (50 mg total) by mouth at bedtime.   No facility-administered encounter medications on file as of 05/08/2017.     Past Medical History:  Diagnosis Date  . Arthritis   . Asthma    childhood now returning  . Back pain affecting pregnancy 11/02/2014  .  Benign essential tremor 10/28/2014  . Bilateral dry eyes   . Cancer (HCC)    Breast  . Dry eyes   . Encounter for Medicare annual wellness exam 05/22/2016  . Essential hypertension, benign 10/28/2014  . H/O measles   . H/O mumps   . History of chicken pox   . Lumbago 11/02/2014  . Mixed  hyperlipidemia 10/28/2014  . Obesity 10/28/2014    Past Surgical History:  Procedure Laterality Date  . BACK SURGERY    . BREAST REDUCTION SURGERY Left   . CHOLECYSTECTOMY    . EYE SURGERY  2008   b/l cataracts removed, in [place]  . MASTECTOMY Right   . REPLACEMENT TOTAL KNEE BILATERAL Bilateral   . ROTATOR CUFF REPAIR Left     Social History   Socioeconomic History  . Marital status: Married    Spouse name: Not on file  . Number of children: Not on file  . Years of education: Not on file  . Highest education level: Not on file  Occupational History  . 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 Financial resource strain: Not on file  . Food insecurity:    Worry: Not on file    Inability: Not on file  . Transportation needs:    Medical: Not on file    Non-medical: Not on file  Tobacco Use  . Smoking status: Never Smoker  . Smokeless tobacco: Never Used  Substance and Sexual Activity  . Alcohol use: Yes    Alcohol/week: 0.0 oz    Comment: two times a month  . Drug use: No  . Sexual activity: Not on file    Comment: lives with husband, moved [place], no dietary restrictions, retired Education officer, museum  Lifestyle  . Physical activity:    Days per week: Not on file    Minutes per session: Not on file  . Stress: Not on file  Relationships  . Social connections:    Talks on phone: Not on file    Gets together: Not on file    Attends religious service: Not on file    Active member of club or organization: Not on file    Attends meetings of clubs or organizations: Not on file    Relationship status: Not on file  . Intimate partner violence:    Fear of  current or ex partner: Not on file    Emotionally abused: Not on file    Physically abused: Not on file    Forced sexual activity: Not on file  Other Topics Concern  . Not on file  Social History Narrative  . Not on file    Family Status  Relation Name Status  . Mother 47 Deceased       heart disease  . Father 59 Deceased       colon cancer, Parkinson's Disease  . MGM 65ish Deceased  . MGF 45ish Deceased  . Daughter 61 Alive  . PGM 40ish Deceased  . PGF 40ish,accident Deceased  . Neg Hx  (Not Specified)    Review of Systems SOB with asthma.  A complete 10 system ROS was obtained and was negative apart from what is mentioned.   Objective:   VITALS:   Vitals:   05/08/17 0801  BP: 136/80  Pulse: 60  SpO2: 93%  Weight: 252 lb (114.3 kg)  Height: 5\' 1"  (1.549 m)   Gen:  Appears stated age and in NAD. HEENT:  Normocephalic, atraumatic. The mucous membranes are moist. The superficial temporal arteries are without ropiness or tenderness. Cardiovascular:RRR Lungs: CTAB.  No wheezing Neck: There are no carotid bruits noted bilaterally.  NEUROLOGICAL:  Orientation:  The Amanda Copeland is alert and oriented x 3.   Cranial nerves: There is good facial symmetry. Extraocular muscles are intact and visual fields are full to confrontational testing.  Speech is fluent and clear. Soft palate rises symmetrically and there is no tongue deviation. Hearing is intact to conversational tone. Tone: Tone is good throughout. Sensation: Sensation is intact to light touch throughout Coordination:  The Amanda Copeland has no dysdiadichokinesia but has some trouble with FNF on the right with eyes closed but not with eyes open Motor: Strength is 5/5 in the UE/LE Gait and Station: The Amanda Copeland is able to ambulate without difficulty.  The Amanda Copeland is slightly wide based  MOVEMENT EXAM: Tremor:  There is no tremor of outstretched hands.  There is minimal tremor with intention, more so on the right.  Minimal tremor  with archimedes spirals.    Labs:    Chemistry      Component Value Date/Time   NA 142 03/03/2017 1225   NA 145 (H) 12/23/2016 1114   K 4.1 03/03/2017 1225   CL 103 03/03/2017 1225   CO2 28 03/03/2017 1225   BUN 20 03/03/2017 1225   BUN 14 12/23/2016 1114   CREATININE 0.86 03/03/2017 1225      Component Value Date/Time   CALCIUM 9.2 03/03/2017 1225   ALKPHOS 87 03/03/2017 1225   AST 14 03/03/2017 1225   ALT 11 03/03/2017 1225   BILITOT 0.3 03/03/2017 1225     Lab Results  Component Value Date   TSH 2.81 03/03/2017        Assessment/Plan:   1.  Essential Tremor.  -Dr. Melvyn Novas noted worsening of asthma on propranolol and attempted to discontinue this, but the Amanda Copeland's tremor got much worse.  She went back on the propranolol, 20 mg bid.  She isn't happy with tremor control although I didn't see much tremor on examination.  I didn't want to increase the propranolol given asthma concerns.  She and I discussed 2nd line medications.  She wants to try.  Ultimately decided on topamax and work to 100 mg. Risks, benefits, side effects and alternative therapies were discussed.  The opportunity to ask questions was given and they were answered to the best of my ability.  The Amanda Copeland expressed understanding and willingness to follow the outlined treatment protocols.  -she has d/c the Primidone 2.  Follow up is anticipated in the next few months, sooner should new neurologic issues arise.

## 2017-05-05 ENCOUNTER — Other Ambulatory Visit: Payer: Self-pay | Admitting: Neurology

## 2017-05-05 ENCOUNTER — Ambulatory Visit: Payer: Self-pay | Admitting: Neurology

## 2017-05-08 ENCOUNTER — Encounter: Payer: Self-pay | Admitting: Neurology

## 2017-05-08 ENCOUNTER — Ambulatory Visit (INDEPENDENT_AMBULATORY_CARE_PROVIDER_SITE_OTHER): Payer: Medicare Other | Admitting: Neurology

## 2017-05-08 VITALS — BP 136/80 | HR 60 | Ht 61.0 in | Wt 252.0 lb

## 2017-05-08 DIAGNOSIS — G250 Essential tremor: Secondary | ICD-10-CM

## 2017-05-08 DIAGNOSIS — J45909 Unspecified asthma, uncomplicated: Secondary | ICD-10-CM

## 2017-05-08 MED ORDER — TOPIRAMATE 25 MG PO TABS: 25.0000 mg | ORAL_TABLET | ORAL | 0 refills | Status: DC

## 2017-05-08 MED ORDER — TOPIRAMATE 100 MG PO TABS: 100.0000 mg | ORAL_TABLET | Freq: Every day | ORAL | 1 refills | Status: DC

## 2017-05-08 NOTE — Patient Instructions (Signed)
Start topamax, 25 mg, 1 tablet daily for 1 week, then 2 tablets daily for 1 week and then 3 tablets daily for 1 week.  Then the RX for the 25 mg tablets will run out and you will take topamax 100 mg, one tablet once per day

## 2017-05-15 ENCOUNTER — Encounter: Payer: Self-pay | Admitting: Family Medicine

## 2017-05-15 ENCOUNTER — Ambulatory Visit (INDEPENDENT_AMBULATORY_CARE_PROVIDER_SITE_OTHER): Payer: Medicare Other | Admitting: Family Medicine

## 2017-05-15 ENCOUNTER — Ambulatory Visit: Payer: Self-pay | Admitting: *Deleted

## 2017-05-15 VITALS — BP 132/78 | HR 67 | Temp 98.2°F | Ht 61.0 in | Wt 245.0 lb

## 2017-05-15 DIAGNOSIS — B9789 Other viral agents as the cause of diseases classified elsewhere: Secondary | ICD-10-CM | POA: Diagnosis not present

## 2017-05-15 DIAGNOSIS — J45901 Unspecified asthma with (acute) exacerbation: Secondary | ICD-10-CM | POA: Diagnosis not present

## 2017-05-15 DIAGNOSIS — J069 Acute upper respiratory infection, unspecified: Secondary | ICD-10-CM

## 2017-05-15 MED ORDER — IPRATROPIUM-ALBUTEROL 0.5-2.5 (3) MG/3ML IN SOLN
3.0000 mL | Freq: Once | RESPIRATORY_TRACT | Status: AC
  Administered 2017-05-15: 3 mL via RESPIRATORY_TRACT

## 2017-05-15 MED ORDER — METHYLPREDNISOLONE ACETATE 80 MG/ML IJ SUSP
80.0000 mg | Freq: Once | INTRAMUSCULAR | Status: AC
  Administered 2017-05-15: 80 mg via INTRAMUSCULAR

## 2017-05-15 MED ORDER — PROMETHAZINE-CODEINE 6.25-10 MG/5ML PO SYRP: 5.0000 mL | ORAL_SOLUTION | Freq: Four times a day (QID) | ORAL | 0 refills | Status: DC | PRN

## 2017-05-15 NOTE — Progress Notes (Signed)
Pre visit review using our clinic review tool, if applicable. No additional management support is needed unless otherwise documented below in the visit note. 

## 2017-05-15 NOTE — Telephone Encounter (Signed)
Pt's husband, [name], called because the pt had a cough, wheezing, sweating since; she has pain when she coughs; her worse symptom is cough which is non-productive; nurse triage initiated and  recommendations made per protocol to include seeing a physician within 24 hours; pt normally sees Dr Randel Pigg but she has no availability today; her husband would like for her to be seen in the office today; they are offered an appointment with Dr Nani Ravens, [facility], today at 0945; he verbalizes understanding; will route to office for notification of this upcoming appointment.   Reason for Disposition . SEVERE coughing spells (e.g., whooping sound after coughing, vomiting after coughing)  Answer Assessment - Initial Assessment Questions 1. ONSET: "When did the cough begin?"      Friday 05/13/17 2. SEVERITY: "How bad is the cough today?"      "pretty bad" 3. RESPIRATORY DISTRESS: "Describe your breathing."      Hard to catch breath when coughing 4. FEVER: "Do you have a fever?" If so, ask: "What is your temperature, how was it measured, and when did it start?"     unsure 5. HEMOPTYSIS: "Are you coughing up any blood?" If so ask: "How much?" (flecks, streaks, tablespoons, etc.)     no 6. TREATMENT: "What have you done so far to treat the cough?" (e.g., meds, fluids, humidifier)     Taking cough syrup with codiene 7. CARDIAC HISTORY: "Do you have any history of heart disease?" (e.g., heart attack, congestive heart failure)      hypertension 8. LUNG HISTORY: "Do you have any history of lung disease?"  (e.g., pulmonary embolus, asthma, emphysema)     asthma 9. PE RISK FACTORS: "Do you have a history of blood clots?" (or: recent major surgery, recent prolonged travel, bedridden ) no 10. OTHER SYMPTOMS: "Do you have any other symptoms? (e.g., runny nose, wheezing, chest pain)       wheezing 11. PREGNANCY: "Is there any chance you are pregnant?" "When was your last menstrual period?"       no 12. TRAVEL:  "Have you traveled out of the country in the last month?" (e.g., travel history, exposures)      [place] in [place]  Protocols used: [place]

## 2017-05-15 NOTE — Telephone Encounter (Signed)
FYI to Dr. Nani Ravens.

## 2017-05-15 NOTE — Progress Notes (Signed)
Chief Complaint  Patient presents with  . Cough  . Wheezing    Amanda Copeland here for URI complaints.   Duration: 3 days  Associated symptoms: sinus congestion, rhinorrhea, itchy watery eyes, ear pain, sore throat, wheezing, shortness of breath and sweats Denies: sinus pain, ear drainage, myalgia and fevers Treatment to date: albuterol Sick contacts: No   +Hx of asthma.  ROS:  HEENT: As noted in HPI Lungs: +SOB  Past Medical History:  Diagnosis Date  . Arthritis   . Asthma    childhood now returning  . Back pain affecting pregnancy 11/02/2014  . Benign essential tremor 10/28/2014  . Bilateral dry eyes   . Cancer (HCC)    Breast  . Dry eyes   . Encounter for Medicare annual wellness exam 05/22/2016  . Essential hypertension, benign 10/28/2014  . H/O measles   . H/O mumps   . History of chicken pox   . Lumbago 11/02/2014  . Mixed hyperlipidemia 10/28/2014  . Obesity 10/28/2014     BP 132/78 (BP Location: Left Arm, Patient Position: Sitting, Cuff Size: Large)   Pulse 67   Temp 98.2 F (36.8 C) (Oral)   Ht 5\' 1"  (1.549 m)   Wt 245 lb (111.1 kg)   SpO2 93%   BMI 46.29 kg/m  General: Awake, alert, appears stated age HEENT: AT, Walker, ears patent b/l and TM's neg, nares patent w/o discharge, pharynx pink and without exudates, MMM Neck: No masses or asymmetry Heart: RRR Lungs: CTAB, no accessory muscle use Psych: Age appropriate judgment and insight, normal mood and affect  Viral URI with cough - Plan: promethazine-codeine (PHENERGAN WITH CODEINE) 6.25-10 MG/5ML syrup  Exacerbation of asthma, unspecified asthma severity, unspecified whether persistent  Orders as above. Breathing tx today, use spacer for home use.  Send MC message in 2-3 days if no improvement.  Continue to push fluids, practice good hand hygiene, cover mouth when coughing. F/u prn. If starting to experience fevers, shaking, or shortness of breath, seek immediate care. Pt voiced understanding and  agreement to the plan.  [place], DO 05/15/17 10:11 AM

## 2017-05-15 NOTE — Addendum Note (Signed)
Addended by: Sharon Seller B on: 05/15/2017 10:18 AM   Modules accepted: Orders

## 2017-05-15 NOTE — Patient Instructions (Signed)
Send me a MyChart message in 2-3 days if you are not getting better.  Continue to push fluids, practice good hand hygiene, and cover your mouth if you cough.  If you start having fevers, shaking or shortness of breath, seek immediate care.  Try using the spacer with your albuterol.   Let us know if you need anything.

## 2017-05-19 ENCOUNTER — Other Ambulatory Visit: Payer: Self-pay | Admitting: Family Medicine

## 2017-05-19 ENCOUNTER — Encounter: Payer: Self-pay | Admitting: Family Medicine

## 2017-05-19 MED ORDER — AZITHROMYCIN 250 MG PO TABS: ORAL_TABLET | ORAL | 0 refills | Status: DC

## 2017-05-24 ENCOUNTER — Other Ambulatory Visit: Payer: Self-pay | Admitting: Internal Medicine

## 2017-05-24 ENCOUNTER — Other Ambulatory Visit: Payer: Self-pay | Admitting: Family Medicine

## 2017-05-24 DIAGNOSIS — J45991 Cough variant asthma: Secondary | ICD-10-CM

## 2017-05-30 ENCOUNTER — Other Ambulatory Visit: Payer: Self-pay | Admitting: Neurology

## 2017-06-05 ENCOUNTER — Ambulatory Visit (INDEPENDENT_AMBULATORY_CARE_PROVIDER_SITE_OTHER): Payer: Medicare Other | Admitting: *Deleted

## 2017-06-05 ENCOUNTER — Encounter: Payer: Self-pay | Admitting: *Deleted

## 2017-06-05 VITALS — BP 135/71 | HR 60 | Ht 61.0 in | Wt 244.8 lb

## 2017-06-05 DIAGNOSIS — Z780 Asymptomatic menopausal state: Secondary | ICD-10-CM

## 2017-06-05 DIAGNOSIS — Z1239 Encounter for other screening for malignant neoplasm of breast: Secondary | ICD-10-CM

## 2017-06-05 DIAGNOSIS — Z1231 Encounter for screening mammogram for malignant neoplasm of breast: Secondary | ICD-10-CM

## 2017-06-05 DIAGNOSIS — Z0000 Encounter for general adult medical examination without abnormal findings: Secondary | ICD-10-CM

## 2017-06-05 NOTE — Progress Notes (Signed)
Subjective:   Amanda Copeland is a 77 y.o. female who presents for Medicare Annual/Subsequent preventive examination.  Review of Systems: No ROS.  Medicare Wellness Visit. Additional risk factors are reflected in the social history.  Cardiac Risk Factors include: advanced age (>8men, >60 women);dyslipidemia;hypertension;obesity (BMI >30kg/m2) Sleep patterns: Sleeps 8-10 hrs. Feels rested. Home Safety/Smoke Alarms: Feels safe in home. Smoke alarms in place.  Living environment; residence and Firearm Safety: Lives in 1 story with husband.  Seat Belt Safety/Bike Helmet: Wears seat belt.   Female:        Mammo- ordered    Dexa scan- ordered [facility]-2014. Follows up with Dr.Jacobs. No longer doing routine screening due to age.   Objective:    Vitals: BP 135/71 (BP Location: Left Wrist, Patient Position: Sitting, Cuff Size: Normal)   Pulse 60   Ht 5\' 1"  (1.549 m)   Wt 244 lb 12.8 oz (111 kg)   SpO2 97%   BMI 46.25 kg/m   Body mass index is 46.25 kg/m.  Advanced Directives 06/05/2017 08/09/2016 05/22/2016 07/30/2015 12/24/2014  Does Patient Have a Medical Advance Directive? Yes Yes No No Yes  Type of Paramedic of [place];Living will - - - -  Does patient want to make changes to medical advance directive? No - Patient declined No - Patient declined - - -  Copy of [place] in Chart? No - copy requested - - - No - copy requested  Would patient like information on creating a medical advance directive? - - Yes (MAU/Ambulatory/Procedural Areas - Information given) No - patient declined information -    Tobacco Social History   Tobacco Use  Smoking Status Never Smoker  Smokeless Tobacco Never Used     Counseling given: Not Answered   Clinical Intake:     Pain : No/denies pain                 Past Medical History:  Diagnosis Date  . Arthritis   . Asthma    childhood now returning  . Back pain affecting pregnancy 11/02/2014  .  Benign essential tremor 10/28/2014  . Bilateral dry eyes   . Cancer (HCC)    Breast  . Dry eyes   . Encounter for Medicare annual wellness exam 05/22/2016  . Essential hypertension, benign 10/28/2014  . H/O measles   . H/O mumps   . History of chicken pox   . Lumbago 11/02/2014  . Mixed hyperlipidemia 10/28/2014  . Obesity 10/28/2014   Past Surgical History:  Procedure Laterality Date  . BACK SURGERY    . BREAST REDUCTION SURGERY Left   . CHOLECYSTECTOMY    . EYE SURGERY  2008   b/l cataracts removed, in [place]  . MASTECTOMY Right   . REPLACEMENT TOTAL KNEE BILATERAL Bilateral   . ROTATOR CUFF REPAIR Left    Family History  Problem Relation Age of Onset  . Heart disease Mother   . Heart attack Mother   . Heart failure Mother   . Hypertension Mother   . Cancer Father        colon  . Asthma Father   . Parkinson's disease Father   . Cancer Maternal Grandmother        uterine  . Heart disease Maternal Grandfather   . Obesity Daughter   . Appendicitis Paternal Grandmother   . Stroke Neg Hx    Social History   Socioeconomic History  . Marital status: Married  Spouse name: Not on file  . Number of children: Not on file  . Years of education: Not on file  . Highest education level: Not on file  Occupational History  . 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 Financial resource strain: Not on file  . Food insecurity:    Worry: Not on file    Inability: Not on file  . Transportation needs:    Medical: Not on file    Non-medical: Not on file  Tobacco Use  . Smoking status: Never Smoker  . Smokeless tobacco: Never Used  Substance and Sexual Activity  . Alcohol use: Yes    Alcohol/week: 0.0 oz    Comment: two times a month  . Drug use: No  . Sexual activity: Not on file    Comment: lives with husband, moved [place], no dietary restrictions, retired Education officer, museum  Lifestyle  . Physical activity:    Days per week: Not on file     Minutes per session: Not on file  . Stress: Not on file  Relationships  . Social connections:    Talks on phone: Not on file    Gets together: Not on file    Attends religious service: Not on file    Active member of club or organization: Not on file    Attends meetings of clubs or organizations: Not on file    Relationship status: Not on file  Other Topics Concern  . Not on file  Social History Narrative  . Not on file    Outpatient Encounter Medications as of 06/05/2017  Medication Sig  . albuterol (PROAIR HFA) 108 (90 Base) MCG/ACT inhaler Inhale 2 puffs into the lungs every 6 (six) hours as needed for wheezing or shortness of breath.  . allopurinol (ZYLOPRIM) 100 MG tablet Take 1 tablet (100 mg total) by mouth daily.  Marland Kitchen aspirin 81 MG tablet Take 81 mg by mouth daily.  Marland Kitchen diltiazem (TIAZAC) 180 MG 24 hr capsule Take 1 capsule (180 mg total) by mouth daily.  Marland Kitchen gabapentin (NEURONTIN) 300 MG capsule TAKE 1 CAPSULE BY MOUTH THREE TIMES A DAY  . montelukast (SINGULAIR) 10 MG tablet TAKE 1 TABLET BY MOUTH EVERY DAY NIGHTLY  . Multiple Vitamins-Minerals (ICAPS AREDS 2) CAPS Take 1 capsule by mouth 2 (two) times daily.  . Potassium Chloride ER 20 MEQ TBCR Take 20 mEq daily by mouth.  Marland Kitchen PREVACID 30 MG capsule TAKE ONE CAPSULE BY MOUTH TWICE A DAY  . propranolol (INDERAL) 20 MG tablet Take 20 mg by mouth 2 (two) times daily.  . Red Yeast Rice Extract (RED YEAST RICE PO) Take by mouth daily.  Marland Kitchen topiramate (TOPAMAX) 100 MG tablet Take 1 tablet (100 mg total) by mouth daily.  Marland Kitchen topiramate (TOPAMAX) 25 MG tablet Take 1 tablet (25 mg total) by mouth as directed.  . fluticasone (FLONASE) 50 MCG/ACT nasal spray USE 2 SPRAYS IN EACH NOSTRIL AT BEDTIME  . furosemide (LASIX) 40 MG tablet Take 1 tablet (40 mg total) daily by mouth.  . promethazine-codeine (PHENERGAN WITH CODEINE) 6.25-10 MG/5ML syrup Take 5 mLs by mouth every 6 (six) hours as needed for cough. (Patient not taking: Reported on 06/05/2017)   . [DISCONTINUED] azithromycin (ZITHROMAX) 250 MG tablet Take 2 tabs the first day and then 1 tab daily until you run out.   No facility-administered encounter medications on file as of 06/05/2017.     Activities of Daily Living In your present  state of health, do you have any difficulty performing the following activities: 06/05/2017  Hearing? N  Vision? N  Comment wearing glasses. Dr.Digby and Dr. Posey Pronto.  Difficulty concentrating or making decisions? N  Walking or climbing stairs? Y  Dressing or bathing? N  Doing errands, shopping? N  Preparing Food and eating ? N  Using the Toilet? N  In the past six months, have you accidently leaked urine? N  Do you have problems with loss of bowel control? N  Managing your Medications? N  Managing your Finances? N  Housekeeping or managing your Housekeeping? N  Some recent data might be hidden    Patient Care Team: [name], MD as PCP - General (Family Medicine) Druscilla Brownie, MD as Consulting Physician (Dermatology) Jettie Booze, MD as Consulting Physician (Cardiology) Tat, Eustace Quail, DO as Consulting Physician (Neurology) Calvert Cantor, MD as Consulting Physician (Ophthalmology) Tanda Rockers, MD as Consulting Physician (Pulmonary Disease)   Assessment:   This is a routine wellness examination for Amanda Copeland. Physical assessment deferred to PCP.   Exercise Activities and Dietary recommendations Current Exercise Habits: The patient does not participate in regular exercise at present, Exercise limited by: None identified Diet (meal preparation, eat out, water intake, caffeinated beverages, dairy products, fruits and vegetables): well balanced. Drinks plenty of water.   Goals    . Eat more vegetables       Fall Risk Fall Risk  06/05/2017 05/08/2017 03/16/2017 10/12/2016 04/11/2016  Falls in the past year? Yes Yes Yes No No  Number falls in past yr: 2 or more 1 2 or more - -  Injury with Fall? No No No - -  Risk Factor  Category  - - High Fall Risk - -  Follow up Education provided;Falls prevention discussed Falls evaluation completed Falls evaluation completed - -     Depression Screen PHQ 2/9 Scores 06/05/2017 11/09/2015  PHQ - 2 Score 0 0    Cognitive Function        Immunization History  Administered Date(s) Administered  . Influenza, High Dose Seasonal PF 11/09/2015  . Influenza-Unspecified 09/07/2012, 10/09/2014, 11/03/2016  . Pneumococcal Conjugate-13 11/09/2015  . Pneumococcal Polysaccharide-23 03/03/2017  . Td 11/09/2015  . Zoster 02/08/2012   Screening Tests Health Maintenance  Topic Date Due  . INFLUENZA VACCINE  09/07/2017  . COLONOSCOPY  11/15/2017  . TETANUS/TDAP  11/08/2025  . DEXA SCAN  Completed  . PNA vac Low Risk Adult  Completed      Plan:   Follow up with Dr. Charlett Blake as scheduled 08/17/17.   Continue to eat heart healthy diet (full of fruits, vegetables, whole grains, lean protein, water--limit salt, fat, and sugar intake) and increase physical activity as tolerated.  Continue doing brain stimulating activities (puzzles, reading, adult coloring books, staying active) to keep memory sharp.   Bring a copy of your living will and/or healthcare power of attorney to your next office visit.  I have ordered your mammogram and bone density scan. They will call you to set up appointment.   I have personally reviewed and noted the following in the patient's chart:   . Medical and social history . Use of alcohol, tobacco or illicit drugs  . Current medications and supplements . Functional ability and status . Nutritional status . Physical activity . Advanced directives . List of other physicians . Hospitalizations, surgeries, and ER visits in previous 12 months . Vitals . Screenings to include cognitive, depression, and falls . Referrals and  appointments  In addition, I have reviewed and discussed with patient certain preventive protocols, quality metrics, and best  practice recommendations. A written personalized care plan for preventive services as well as general preventive health recommendations were provided to patient.     Amanda Copeland, [place]  06/05/2017

## 2017-06-05 NOTE — Patient Instructions (Signed)
Follow up with Dr. Charlett Blake as scheduled 08/17/17.   Continue to eat heart healthy diet (full of fruits, vegetables, whole grains, lean protein, water--limit salt, fat, and sugar intake) and increase physical activity as tolerated.  Continue doing brain stimulating activities (puzzles, reading, adult coloring books, staying active) to keep memory sharp.   Bring a copy of your living will and/or healthcare power of attorney to your next office visit.  I have ordered your mammogram and bone density scan. They will call you to set up appointment   Amanda Copeland , Thank you for taking time to come for your Medicare Wellness Visit. I appreciate your ongoing commitment to your health goals. Please review the following plan we discussed and let me know if I can assist you in the future.   These are the goals we discussed: Goals    . Eat more vegetables       This is a list of the screening recommended for you and due dates:  Health Maintenance  Topic Date Due  . Flu Shot  09/07/2017  . Colon Cancer Screening  11/15/2017  . Tetanus Vaccine  11/08/2025  . DEXA scan (bone density measurement)  Completed  . Pneumonia vaccines  Completed    Health Maintenance for Postmenopausal Women Menopause is a normal process in which your reproductive ability comes to an end. This process happens gradually over a span of months to years, usually between the ages of 27 and 69. Menopause is complete when you have missed 12 consecutive menstrual periods. It is important to talk with your health care provider about some of the most common conditions that affect postmenopausal women, such as heart disease, cancer, and bone loss (osteoporosis). Adopting a healthy lifestyle and getting preventive care can help to promote your health and wellness. Those actions can also lower your chances of developing some of these common conditions. What should I know about menopause? During menopause, you may experience a number of  symptoms, such as:  Moderate-to-severe hot flashes.  Night sweats.  Decrease in sex drive.  Mood swings.  Headaches.  Tiredness.  Irritability.  Memory problems.  Insomnia.  Choosing to treat or not to treat menopausal changes is an individual decision that you make with your health care provider. What should I know about hormone replacement therapy and supplements? Hormone therapy products are effective for treating symptoms that are associated with menopause, such as hot flashes and night sweats. Hormone replacement carries certain risks, especially as you become older. If you are thinking about using estrogen or estrogen with progestin treatments, discuss the benefits and risks with your health care provider. What should I know about heart disease and stroke? Heart disease, heart attack, and stroke become more likely as you age. This may be due, in part, to the hormonal changes that your body experiences during menopause. These can affect how your body processes dietary fats, triglycerides, and cholesterol. Heart attack and stroke are both medical emergencies. There are many things that you can do to help prevent heart disease and stroke:  Have your blood pressure checked at least every 1-2 years. High blood pressure causes heart disease and increases the risk of stroke.  If you are 43-24 years old, ask your health care provider if you should take aspirin to prevent a heart attack or a stroke.  Do not use any tobacco products, including cigarettes, chewing tobacco, or electronic cigarettes. If you need help quitting, ask your health care provider.  It is important to  eat a healthy diet and maintain a healthy weight. ? Be sure to include plenty of vegetables, fruits, low-fat dairy products, and lean protein. ? Avoid eating foods that are high in solid fats, added sugars, or salt (sodium).  Get regular exercise. This is one of the most important things that you can do for your  health. ? Try to exercise for at least 150 minutes each week. The type of exercise that you do should increase your heart rate and make you sweat. This is known as moderate-intensity exercise. ? Try to do strengthening exercises at least twice each week. Do these in addition to the moderate-intensity exercise.  Know your numbers.Ask your health care provider to check your cholesterol and your blood glucose. Continue to have your blood tested as directed by your health care provider.  What should I know about cancer screening? There are several types of cancer. Take the following steps to reduce your risk and to catch any cancer development as early as possible. Breast Cancer  Practice breast self-awareness. ? This means understanding how your breasts normally appear and feel. ? It also means doing regular breast self-exams. Let your health care provider know about any changes, no matter how small.  If you are 37 or older, have a clinician do a breast exam (clinical breast exam or CBE) every year. Depending on your age, family history, and medical history, it may be recommended that you also have a yearly breast X-ray (mammogram).  If you have a family history of breast cancer, talk with your health care provider about genetic screening.  If you are at high risk for breast cancer, talk with your health care provider about having an MRI and a mammogram every year.  Breast cancer (BRCA) gene test is recommended for women who have family members with BRCA-related cancers. Results of the assessment will determine the need for genetic counseling and BRCA1 and for BRCA2 testing. BRCA-related cancers include these types: ? Breast. This occurs in males or females. ? Ovarian. ? Tubal. This may also be called fallopian tube cancer. ? Cancer of the abdominal or pelvic lining (peritoneal cancer). ? Prostate. ? Pancreatic.  Cervical, Uterine, and Ovarian Cancer Your health care provider may recommend  that you be screened regularly for cancer of the pelvic organs. These include your ovaries, uterus, and vagina. This screening involves a pelvic exam, which includes checking for microscopic changes to the surface of your cervix (Pap test).  For women ages 21-65, health care providers may recommend a pelvic exam and a Pap test every three years. For women ages 74-65, they may recommend the Pap test and pelvic exam, combined with testing for human papilloma virus (HPV), every five years. Some types of HPV increase your risk of cervical cancer. Testing for HPV may also be done on women of any age who have unclear Pap test results.  Other health care providers may not recommend any screening for nonpregnant women who are considered low risk for pelvic cancer and have no symptoms. Ask your health care provider if a screening pelvic exam is right for you.  If you have had past treatment for cervical cancer or a condition that could lead to cancer, you need Pap tests and screening for cancer for at least 20 years after your treatment. If Pap tests have been discontinued for you, your risk factors (such as having a new sexual partner) need to be reassessed to determine if you should start having screenings again. Some women have  medical problems that increase the chance of getting cervical cancer. In these cases, your health care provider may recommend that you have screening and Pap tests more often.  If you have a family history of uterine cancer or ovarian cancer, talk with your health care provider about genetic screening.  If you have vaginal bleeding after reaching menopause, tell your health care provider.  There are currently no reliable tests available to screen for ovarian cancer.  Lung Cancer Lung cancer screening is recommended for adults 27-24 years old who are at high risk for lung cancer because of a history of smoking. A yearly low-dose CT scan of the lungs is recommended if you:  Currently  smoke.  Have a history of at least 30 pack-years of smoking and you currently smoke or have quit within the past 15 years. A pack-year is smoking an average of one pack of cigarettes per day for one year.  Yearly screening should:  Continue until it has been 15 years since you quit.  Stop if you develop a health problem that would prevent you from having lung cancer treatment.  Colorectal Cancer  This type of cancer can be detected and can often be prevented.  Routine colorectal cancer screening usually begins at age 105 and continues through age 110.  If you have risk factors for colon cancer, your health care provider may recommend that you be screened at an earlier age.  If you have a family history of colorectal cancer, talk with your health care provider about genetic screening.  Your health care provider may also recommend using home test kits to check for hidden blood in your stool.  A small camera at the end of a tube can be used to examine your colon directly (sigmoidoscopy or colonoscopy). This is done to check for the earliest forms of colorectal cancer.  Direct examination of the colon should be repeated every 5-10 years until age 96. However, if early forms of precancerous polyps or small growths are found or if you have a family history or genetic risk for colorectal cancer, you may need to be screened more often.  Skin Cancer  Check your skin from head to toe regularly.  Monitor any moles. Be sure to tell your health care provider: ? About any new moles or changes in moles, especially if there is a change in a mole's shape or color. ? If you have a mole that is larger than the size of a pencil eraser.  If any of your family members has a history of skin cancer, especially at a young age, talk with your health care provider about genetic screening.  Always use sunscreen. Apply sunscreen liberally and repeatedly throughout the day.  Whenever you are outside, protect  yourself by wearing long sleeves, pants, a wide-brimmed hat, and sunglasses.  What should I know about osteoporosis? Osteoporosis is a condition in which bone destruction happens more quickly than new bone creation. After menopause, you may be at an increased risk for osteoporosis. To help prevent osteoporosis or the bone fractures that can happen because of osteoporosis, the following is recommended:  If you are 28-75 years old, get at least 1,000 mg of calcium and at least 600 mg of vitamin D per day.  If you are older than age 68 but younger than age 29, get at least 1,200 mg of calcium and at least 600 mg of vitamin D per day.  If you are older than age 99, get at least 26,200  mg of calcium and at least 800 mg of vitamin D per day.  Smoking and excessive alcohol intake increase the risk of osteoporosis. Eat foods that are rich in calcium and vitamin D, and do weight-bearing exercises several times each week as directed by your health care provider. What should I know about how menopause affects my mental health? Depression may occur at any age, but it is more common as you become older. Common symptoms of depression include:  Low or sad mood.  Changes in sleep patterns.  Changes in appetite or eating patterns.  Feeling an overall lack of motivation or enjoyment of activities that you previously enjoyed.  Frequent crying spells.  Talk with your health care provider if you think that you are experiencing depression. What should I know about immunizations? It is important that you get and maintain your immunizations. These include:  Tetanus, diphtheria, and pertussis (Tdap) booster vaccine.  Influenza every year before the flu season begins.  Pneumonia vaccine.  Shingles vaccine.  Your health care provider may also recommend other immunizations. This information is not intended to replace advice given to you by your health care provider. Make sure you discuss any questions you  have with your health care provider. Document Released: 03/18/2005 Document Revised: 08/14/2015 Document Reviewed: 10/28/2014 Elsevier Interactive Patient Education  2018 [employer].

## 2017-06-08 ENCOUNTER — Encounter (HOSPITAL_BASED_OUTPATIENT_CLINIC_OR_DEPARTMENT_OTHER): Payer: Self-pay

## 2017-06-08 ENCOUNTER — Ambulatory Visit (HOSPITAL_BASED_OUTPATIENT_CLINIC_OR_DEPARTMENT_OTHER)
Admission: RE | Admit: 2017-06-08 | Discharge: 2017-06-08 | Disposition: A | Discharge status: Home / Self Care | Payer: Medicare Other | Source: Ambulatory Visit | Attending: Family Medicine | Admitting: Family Medicine

## 2017-06-08 DIAGNOSIS — Z780 Asymptomatic menopausal state: Secondary | ICD-10-CM

## 2017-06-08 DIAGNOSIS — M85852 Other specified disorders of bone density and structure, left thigh: Secondary | ICD-10-CM | POA: Diagnosis not present

## 2017-06-08 DIAGNOSIS — Z1239 Encounter for other screening for malignant neoplasm of breast: Secondary | ICD-10-CM

## 2017-06-08 DIAGNOSIS — Z1231 Encounter for screening mammogram for malignant neoplasm of breast: Secondary | ICD-10-CM | POA: Diagnosis not present

## 2017-06-08 HISTORY — DX: Malignant neoplasm of unspecified site of unspecified female breast: C50.919

## 2017-06-13 DIAGNOSIS — L821 Other seborrheic keratosis: Secondary | ICD-10-CM | POA: Diagnosis not present

## 2017-06-13 DIAGNOSIS — L814 Other melanin hyperpigmentation: Secondary | ICD-10-CM | POA: Diagnosis not present

## 2017-06-13 DIAGNOSIS — D1801 Hemangioma of skin and subcutaneous tissue: Secondary | ICD-10-CM | POA: Diagnosis not present

## 2017-06-13 DIAGNOSIS — L819 Disorder of pigmentation, unspecified: Secondary | ICD-10-CM | POA: Diagnosis not present

## 2017-06-15 ENCOUNTER — Other Ambulatory Visit: Payer: Self-pay | Admitting: Neurology

## 2017-06-18 ENCOUNTER — Other Ambulatory Visit: Payer: Self-pay | Admitting: Internal Medicine

## 2017-06-18 ENCOUNTER — Other Ambulatory Visit: Payer: Self-pay | Admitting: Family Medicine

## 2017-06-18 DIAGNOSIS — J45991 Cough variant asthma: Secondary | ICD-10-CM

## 2017-06-20 ENCOUNTER — Other Ambulatory Visit: Payer: Self-pay | Admitting: Internal Medicine

## 2017-06-20 DIAGNOSIS — J45991 Cough variant asthma: Secondary | ICD-10-CM

## 2017-07-04 ENCOUNTER — Ambulatory Visit (INDEPENDENT_AMBULATORY_CARE_PROVIDER_SITE_OTHER): Payer: Medicare Other | Admitting: Internal Medicine

## 2017-07-04 ENCOUNTER — Encounter: Payer: Self-pay | Admitting: Internal Medicine

## 2017-07-04 VITALS — BP 128/76 | HR 64 | Ht 60.0 in | Wt 247.0 lb

## 2017-07-04 DIAGNOSIS — G250 Essential tremor: Secondary | ICD-10-CM

## 2017-07-04 DIAGNOSIS — J45991 Cough variant asthma: Secondary | ICD-10-CM

## 2017-07-04 NOTE — Progress Notes (Signed)
Subjective:     Patient ID: Amanda Copeland, female   DOB: 1940-06-15,    MRN: [id number]    Brief patient profile:  11 yowf originally from [place] never smoker dx as asthma age 77 through HS on prns with tendency to bronchitis pretty much every winter in her 89s s antecedent uri's with progressive doe x 2017 so referred to pulmonary clinic 10/06/2016 by Dr   Nani Ravens.     History of Present Illness  10/06/2016 1st  Pulmonary office visit/ Amanda Copeland  On inderal 20 bid /saba/pulmocort / prevacid with bfast Chief Complaint  Patient presents with  . Pulmonary Consult    Referred by Dr. Riki Sheer. Pt c/o SOB for the past year, progressively worsening. She states she gets out of breath just walking short distances such as from lobby to our closest exam room today. She is also winded when she first lies down at night. She also c/o non prod cough. She has pulmicort inhaler that she uses occ- maybe 4-5 x per wk on average and then she uses proair 1-2 x per wk on average.   here in [place] x 2016 moved into brand new house on arrival and continued to have "same bronchitis each winter" with onset doe  separate from episodes of  bronchitis with poor control of sob dating back to 11/2015 on inderol 20 mg bid x 4 years [facility] Cough comes and goes mostly during the day / no noct p asleep but also some coughing right at hs mostly dry  Last time did HT hanging on the cart  No better with saba  rec Prevacid 30 mg Take 30- 60 min before your first and last meals of the day   Stop inderal and replace with lopressor 25 mg twice daily  GERD  Diet        11/28/2016  f/u ov/Amanda Copeland re: unexplained sob /cough x one year  Chief Complaint  Patient presents with  . Follow-up    PFT's done today. Her breathing is unchanged. She has not used her albuterol.   still coughing most days totally sporadic but not p hs  Doe = MMRC2 = can't walk a nl pace on a flat grade s sob but does fine slow and flat eg HT   Tried pulmocort x sev weeks no better Off albuterol no worse, back on inderal due to uncontrolled tremor >  tremor better,cough no worse  Using fish oil daily though included in trial gerd diet to eliminate  rec GERD   Gabapentin 100 mg three times a day and stop singulair now     01/12/2017 acute extended  ov/Amanda Copeland re: cough variant asthma vs uacs  Chief Complaint  Patient presents with  . Acute Visit    Increased SOB and cough for the past wk.  Cough has been non prod.   all symptoms improved  on gabapentin / drove to [place] then felt caught cold / same as husband  Cough is non-productive and 24/7 but took phenergan with codeine noct prior to ov , slept fine s noct cough or am flare but still feels something is stuck in her throat since onset of "cold" rec Increase  Gabapentin to 300 mg three times a day with meals  If still coughing > take your codeine cough medication up to every 4 hours as needed but should gradually improve to point where you don't need it Prevacid 30 mg Take 30- 60 min before your first and last meals  of the day  Depomedrol 120 mg IM today     02/23/2017  f/u ov/Amanda Copeland re: uacs / sob x 11/2015  Chief Complaint  Patient presents with  . Follow-up    Increased cough x 2 wks- mainly non prod, but she did produce a min amount of pale yellow sputum this am.   one week prior to flare not needing any codeine at all and breathing had improved and had no reproducible doe at last in office walk Then flare  x 2 weeks prior to OV  p exposed to sick fm member, husband also caught same cold   Then one week pred shot/ zpak  > improved back to baseline cough and doe x MMRC2 = can't walk a nl pace on a flat grade s sob but does fine slow and flat  But also sob at rest during coughing fits rec For drainage / throat tickle try take CHLORPHENIRAMINE  4 mg - take one every 4 hours as needed - available over the counter- may cause drowsiness so start with just a bedtime dose  or two and see how you tolerate it before trying in daytime   For cough/ congestion > use flutter valve as much  as possible  schedule sinus CT> pos sinusitis rx omnicef x 10 days  and methacholine challenge test > pos asthma on inderal    03/13/2017  f/u ov/Amanda Copeland re:  Chief Complaint  Patient presents with  . Follow-up    Cough has not improved "may be getting worse"- mainly non prod but occ produces some pale yellow sputum.  She is using her proair 1-2 x per wk.   mct reproduced her symptoms "completely" but p albuterol "antedote"  only a little better  Never got the h1 as rec  Better with phenergan with codeine at hs than any other rx Worse with voice use and after supper despite ppi ac bid  Not consistently better with pred Doe = MMRC2 = can't walk a nl pace on a flat grade s sob but does fine slow and flat   rec You tested positive for asthma and will need to get off of inderal if possible or taper it to the lowest possible dose Plan A = Automatic = singulair 10 mg one each evening  Plan B = Backup for breathing  Only use your albuterol (proair)    Please see patient coordinator before you leave today  to schedule sinus CT  Please schedule a follow up office visit in 3 weeks, sooner if needed  with all medications /inhalers/ solutions in hand so we can verify exactly what you are taking. This includes all medications from all doctors and over the counters    04/03/2017  f/u ov/Amanda Copeland re: asthma better/  tremor worse / no cough  Chief Complaint  Patient presents with  . Follow-up    Breathing is doing well and she is no longer coughing.   Dyspnea:  Best she's done with breathing=  MMRC1 = can walk nl pace, flat grade, can't hurry or go uphills or steps s sob   Cough: gone  Sleep: well SABA use: none needed rec Let Dr Tat adjust your mysoline or alternative to highest dose feasible then perhaps add back very low doses of propranolol to tolerable levels of tremors Please schedule a  follow up visit in 3 months but call sooner if needed     07/04/2017  f/u ov/Amanda Copeland re:  Asthma with pos mct/ maint on singulair  and no worse on inderal for tremor  Chief Complaint  Patient presents with  . Follow-up    Breathing is doing well and she denies any new co's. She has not had to use her albuterol inhaler.    Dyspnea:  Not a problem  Cough: no  Problem  Sleep: on side horizontal  SABA use:  None   No obvious day to day or daytime variability or assoc excess/ purulent sputum or mucus plugs or hemoptysis or cp or chest tightness, subjective wheeze or overt sinus or hb symptoms. No unusual exposure hx or h/o childhood pna/ asthma or knowledge of premature birth.  Sleeping  ok without nocturnal  or early am exacerbation  of respiratory  c/o's or need for noct saba. Also denies any obvious fluctuation of symptoms with weather or environmental changes or other aggravating or alleviating factors except as outlined above   Current Allergies, Complete Past Medical History, Past Surgical History, Family History, and Social History were reviewed in [employer] record.  ROS  The following are not active complaints unless bolded Hoarseness, sore throat, dysphagia, dental problems, itching, sneezing,  nasal congestion or discharge of excess mucus or purulent secretions, ear ache,   fever, chills, sweats, unintended wt loss or wt gain, classically pleuritic or exertional cp,  orthopnea pnd or arm/hand swelling  or leg swelling, presyncope, palpitations, abdominal pain, anorexia, nausea, vomiting, diarrhea  or change in bowel habits or change in bladder habits, change in stools or change in urine, dysuria, hematuria,  rash, arthralgias, visual complaints, headache, numbness, weakness or ataxia or problems with walking or coordination related to severe tremor,  change in mood or  memory.        Current Meds  Medication Sig  . albuterol (PROAIR HFA) 108 (90 Base) MCG/ACT  inhaler Inhale 2 puffs into the lungs every 6 (six) hours as needed for wheezing or shortness of breath.  . allopurinol (ZYLOPRIM) 100 MG tablet Take 1 tablet (100 mg total) by mouth daily.  Marland Kitchen aspirin 81 MG tablet Take 81 mg by mouth daily.  Marland Kitchen diltiazem (TIAZAC) 180 MG 24 hr capsule Take 1 capsule (180 mg total) by mouth daily.  . fluticasone (FLONASE) 50 MCG/ACT nasal spray USE 2 SPRAYS IN EACH NOSTRIL AT BEDTIME  . gabapentin (NEURONTIN) 300 MG capsule TAKE 1 CAPSULE BY MOUTH THREE TIMES A DAY.MAX ON INSURANCE (Patient taking differently: TAKE 1 CAPSULE BY MOUTH TWO TIMES A DAY)  . lansoprazole (PREVACID) 30 MG capsule TAKE ONE CAPSULE BY MOUTH TWICE A DAY  . montelukast (SINGULAIR) 10 MG tablet TAKE 1 TABLET BY MOUTH EVERY DAY NIGHTLY  . Multiple Vitamins-Minerals (ICAPS AREDS 2) CAPS Take 1 capsule by mouth 2 (two) times daily.  . Potassium Chloride ER 20 MEQ TBCR Take 20 mEq daily by mouth.  . propranolol (INDERAL) 20 MG tablet TAKE 1 TABLET (20 MG TOTAL) BY MOUTH 2 (TWO) TIMES DAILY.  . Red Yeast Rice Extract (RED YEAST RICE PO) Take by mouth daily.  Marland Kitchen topiramate (TOPAMAX) 100 MG tablet Take 1 tablet (100 mg total) by mouth daily.  . [DISCONTINUED] topiramate (TOPAMAX) 25 MG tablet Take 1 tablet (25 mg total) by mouth as directed.            Objective:   Physical Exam   07/04/2017        247  04/03/2017         248  03/13/2017         246  02/23/2017       243  01/12/2017       252  11/28/2016     245    10/06/16 247 lb (112 kg)  08/22/16 246 lb 9.6 oz (111.9 kg)  05/19/16 241 lb (109.3 kg)    Pleasant obese tremulous wf nad    Vital signs reviewed - Note on arrival 02 sats  97% on RA      No jvd Oropharynx clear Neck supple Lungs clear bilaterally to A and P  RRR no s3 or or sign murmur Abd obese/ soft/ non tender/ ok excursion on isp Ext wam with no edema or clubbing noted Neuro  Alert/ No motor deficits  / severe resting tremor worse on R          Assessment:

## 2017-07-04 NOTE — Progress Notes (Signed)
Subjective:   Amanda Copeland was seen in consultation in the movement disorder clinic at the request of [name], MD.  The evaluation is for tremor.  This patient is accompanied in the office by her spouse who supplements the history.   The patient is a 77 y.o. right handed female with a history of tremor.  The patient reports that she has had tremor in both hands for the last several years.  She went to a neurologist in [place] and was diagnosed with ET and was put on propranolol, 20 mg bid and it helped a lot without side effects.  There is a family hx of tremor in her father but he has PD.  There is no other family hx of tremor    Affected by caffeine:  No. (coffee is only decaf) Affected by alcohol:  unknown Affected by stress:  No. Affected by fatigue:  No. Spills soup if on spoon:  No. (not now that she is on the propranolol) Spills glass of liquid if full:  No. Affects ADL's (tying shoes, brushing teeth, etc):  No.   Outside reports reviewed: historical medical records.  10/09/15 update:  Pt returns for f/u re: ET.  The records that were made available to me were reviewed.   Reports that she is doing well on propranolol 20 mg bid.  Rarely she will forget the night pill and "i can tell in the AM."  No SE with medication.  She is under tons of stress.  Her son in law was dx with a bone cancer and he is getting aggressive chemo and needs an amputation.  Her mother in law is dying in [place].  She is helping to take care of the grandchildren.    04/11/16 update:  Patient returns today for follow-up.  She is on propranolol, 20 mg twice per day.  overall, she states that her tremor has been well controlled.  she denies falls.  denies lightheadedness or near syncope.  No new medical problems.  She states that she can tell if she skips medication.    10/12/16 update:  Patient seen today in follow-up for her essential tremor.  She was on propranolol, 20 mg twice per day but she saw Dr. Melvyn Novas  and states that she was changed to metoprolol instead (to see if her asthma would be better).  She is now on metoprolol 25 mg bid.  She thinks that tremor may be "coming back a little."   Breathing hasn't changed since making the change. I have reviewed records since last visit.  She has been to rehabilitation for low back pain.  She has followed up with Dr. Melvyn Novas in regards to asthma, as above.   She does ask if she needs all 3 blood pressure meds.  She denies hx of CHF or LE edema.    03/16/17 update: Patient is seen today in follow-up.  I have reviewed records and corresponded with her pulmonologist regarding her case.  Dr. Melvyn Novas asked me to take her off of propranolol.  Initially, I was somewhat confused as she was off of propranolol last visit, but it turns out she went back on the medication and it is felt that this is worsening her asthma, as evidenced by PFTs.  Had 2 falls since last visit.  With one she got up in the middle of the night and felt lightheaded and then fell.  With the other she was at movies and was coming down stairs and fell on  her butt.  05/08/17 update: Patient seen today in follow-up.  At our last visit, her pulmonologist had recommended discontinuation of her propranolol due to her asthma.  She called after this was done and stated that her tremor was much worse.  Primidone was increased to 50 mg twice per day.  She emailed me about a week later when she was on a cruise and stated that she was not tolerating the primidone well (made her sleep so deeply that she wet the bed).  She was not enjoying the cruise because of it.  She went to go back on the propranolol.  I told her she could go back on it for the cruise.  Reports today that she is still on the propranolol but she didn't know how much, but thinks it is 20 mg bid.  She reports tremor is worse.  States that she isn't coughing and lungs are "fine."   She is already on gabapentin.  She doesn't know why she is on that.  Reports some  twitching and jerking in the legs.  She went off of the primidone when she went off of the propranolol.   07/05/17 update: Patient is seen today in follow-up.  She is on propranolol, 20 mg twice per day.  Went to pulm yesterday and told that she shouldn't increase propranolol.  She was started on topiramate last visit as an add-on medication.  She reports that vocal tremor is worse and she is now having leg tremor that seems to interfere with walking.  She reports that her legs are "all over the place."  Legs are "jerky" and they feel week.  Legs are more "jerky" than tremulous.  Only on gabapentin for a few months.   She thinks that topamax has been helpful    Current/Previously tried tremor medications: propranolol (felt worsened asthma); metoprolol (didn't help); primidone (made too sleepy and didn't help at 100 mg daily)  Current medications that may exacerbate tremor:  Albuterol (doesn't notice that it affects tremor); symbicort  Allergies  Allergen Reactions  . Amoxicillin Rash  . Ampicillin Rash  . Penicillins Rash  . Statins Other (See Comments)    Per reports muscle aches and joint pain    Outpatient Encounter Medications as of 07/05/2017  Medication Sig  . albuterol (PROAIR HFA) 108 (90 Base) MCG/ACT inhaler Inhale 2 puffs into the lungs every 6 (six) hours as needed for wheezing or shortness of breath.  . allopurinol (ZYLOPRIM) 100 MG tablet Take 1 tablet (100 mg total) by mouth daily.  Marland Kitchen aspirin 81 MG tablet Take 81 mg by mouth daily.  Marland Kitchen diltiazem (TIAZAC) 180 MG 24 hr capsule Take 1 capsule (180 mg total) by mouth daily.  . fluticasone (FLONASE) 50 MCG/ACT nasal spray USE 2 SPRAYS IN EACH NOSTRIL AT BEDTIME  . furosemide (LASIX) 40 MG tablet Take 1 tablet (40 mg total) daily by mouth.  . gabapentin (NEURONTIN) 300 MG capsule TAKE 1 CAPSULE BY MOUTH THREE TIMES A DAY.MAX ON INSURANCE (Patient taking differently: TAKE 1 CAPSULE BY MOUTH TWO TIMES A DAY)  . lansoprazole (PREVACID)  30 MG capsule TAKE ONE CAPSULE BY MOUTH TWICE A DAY  . montelukast (SINGULAIR) 10 MG tablet TAKE 1 TABLET BY MOUTH EVERY DAY NIGHTLY  . Multiple Vitamins-Minerals (ICAPS AREDS 2) CAPS Take 1 capsule by mouth 2 (two) times daily.  . Potassium Chloride ER 20 MEQ TBCR Take 20 mEq daily by mouth.  . propranolol (INDERAL) 20 MG tablet TAKE 1 TABLET (20  MG TOTAL) BY MOUTH 2 (TWO) TIMES DAILY.  . Red Yeast Rice Extract (RED YEAST RICE PO) Take by mouth daily.  Marland Kitchen topiramate (TOPAMAX) 100 MG tablet Take 1 tablet (100 mg total) by mouth daily.  . [DISCONTINUED] topiramate (TOPAMAX) 25 MG tablet Take 1 tablet (25 mg total) by mouth as directed.   No facility-administered encounter medications on file as of 07/05/2017.     Past Medical History:  Diagnosis Date  . Arthritis   . Asthma    childhood now returning  . Back pain affecting pregnancy 11/02/2014  . Benign essential tremor 10/28/2014  . Bilateral dry eyes   . Breast cancer ([facility])    right  . Cancer ([facility])    Breast  . Dry eyes   . Encounter for Medicare annual wellness exam 05/22/2016  . Essential hypertension, benign 10/28/2014  . H/O measles   . H/O mumps   . History of chicken pox   . Lumbago 11/02/2014  . Mixed hyperlipidemia 10/28/2014  . Obesity 10/28/2014    Past Surgical History:  Procedure Laterality Date  . BACK SURGERY    . BREAST REDUCTION SURGERY Left   . CHOLECYSTECTOMY    . EYE SURGERY  2008   b/l cataracts removed, in [place]  . MASTECTOMY Right   . REDUCTION MAMMAPLASTY    . REPLACEMENT TOTAL KNEE BILATERAL Bilateral   . ROTATOR CUFF REPAIR Left     Social History   Socioeconomic History  . Marital status: Married    Spouse name: Not on file  . Number of children: Not on file  . Years of education: Not on file  . Highest education level: Not on file  Occupational History  . 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 Financial resource strain: Not on file  . Food  insecurity:    Worry: Not on file    Inability: Not on file  . Transportation needs:    Medical: Not on file    Non-medical: Not on file  Tobacco Use  . Smoking status: Never Smoker  . Smokeless tobacco: Never Used  Substance and Sexual Activity  . Alcohol use: Yes    Alcohol/week: 0.0 oz    Comment: two times a month  . Drug use: No  . Sexual activity: Not on file    Comment: lives with husband, moved [place], no dietary restrictions, retired Education officer, museum  Lifestyle  . Physical activity:    Days per week: Not on file    Minutes per session: Not on file  . Stress: Not on file  Relationships  . Social connections:    Talks on phone: Not on file    Gets together: Not on file    Attends religious service: Not on file    Active member of club or organization: Not on file    Attends meetings of clubs or organizations: Not on file    Relationship status: Not on file  . Intimate partner violence:    Fear of current or ex partner: Not on file    Emotionally abused: Not on file    Physically abused: Not on file    Forced sexual activity: Not on file  Other Topics Concern  . Not on file  Social History Narrative  . Not on file    Family Status  Relation Name Status  . Mother 37 Deceased       heart disease  . Father 44 Deceased  colon cancer, Parkinson's Disease  . MGM 65ish Deceased  . MGF 45ish Deceased  . Daughter 20 Alive  . PGM 40ish Deceased  . PGF 40ish,accident Deceased  . Neg Hx  (Not Specified)    Review of Systems SOB with asthma.  A complete 10 system ROS was obtained and was negative apart from what is mentioned.   Objective:   VITALS:   Vitals:   07/05/17 0939  BP: 104/66  Pulse: 60  SpO2: 94%  Weight: 246 lb (111.6 kg)  Height: 5' (1.524 m)   Gen:  Appears stated age and in NAD. HEENT:  Normocephalic, atraumatic. The mucous membranes are moist. The superficial temporal arteries are without ropiness or tenderness. Cardiovascular:RRR Lungs:  CTAB.  No wheezing Neck: There are no carotid bruits noted bilaterally.  NEUROLOGICAL:  Orientation:  The patient is alert and oriented x 3.   Cranial nerves: There is good facial symmetry. Extraocular muscles are intact and visual fields are full to confrontational testing. Speech is fluent and clear. Soft palate rises symmetrically and there is no tongue deviation. Hearing is intact to conversational tone. Tone: Tone is good throughout. Sensation: Sensation is intact to light touch throughout Coordination:  The patient has no dysdiadichokinesia but has some trouble with FNF on the right with eyes closed but not with eyes open Motor: Strength is 5/5 in the UE/LE Gait and Station: The patient is able to ambulate without difficulty.  She is wide based and slightly unstable in the turns.  MOVEMENT EXAM: Tremor:  There is no tremor of outstretched hands.  There is minimal tremor with intention, more so on the right.  No tremor with archimedes spirals.    Labs:    Chemistry      Component Value Date/Time   NA 142 03/03/2017 1225   NA 145 (H) 12/23/2016 1114   K 4.1 03/03/2017 1225   CL 103 03/03/2017 1225   CO2 28 03/03/2017 1225   BUN 20 03/03/2017 1225   BUN 14 12/23/2016 1114   CREATININE 0.86 03/03/2017 1225      Component Value Date/Time   CALCIUM 9.2 03/03/2017 1225   ALKPHOS 87 03/03/2017 1225   AST 14 03/03/2017 1225   ALT 11 03/03/2017 1225   BILITOT 0.3 03/03/2017 1225     Lab Results  Component Value Date   TSH 2.81 03/03/2017        Assessment/Plan:   1.  Essential Tremor.  -Dr. Melvyn Novas noted worsening of asthma on propranolol and attempted to discontinue this, but the patient's tremor got much worse.  She went back on the propranolol, 20 mg bid.  She isn't happy with tremor control although I didn't see much tremor on examination.  I didn't want to increase the propranolol given asthma concerns. She failed primidone.  She doesn't think that topamax has been  helpful.  Discussed purely anticholinergic meds but there are risks in this age group.    -she has d/c the Primidone  -I wonder if the jerking in the legs is not myoclonus, initiated by gabapentin.  She doesn't know why she is on the gabapentin.  We will hold that for now and will call her in a few weeks to see if better.  -discussed that only thing that helps vocal tremor is botox.   2.  Gait instability  -some related to dizziness.   Pt to take BP at home.  Wonder if low blood pressure is contributing (on propranolol and tiazac)    -  balance therapy could be of benefit.  She is interested in PT.  Will refer for balance therapy at [address] location.   3.  F/u at previously scheduled appt in august

## 2017-07-04 NOTE — Patient Instructions (Addendum)
Pulmonary follow up is as needed - if you need more inderal then there may come a time when it makes you wheeze or cough and if so return here.

## 2017-07-05 ENCOUNTER — Encounter: Payer: Self-pay | Admitting: Neurology

## 2017-07-05 ENCOUNTER — Ambulatory Visit (INDEPENDENT_AMBULATORY_CARE_PROVIDER_SITE_OTHER): Payer: Medicare Other | Admitting: Neurology

## 2017-07-05 ENCOUNTER — Encounter: Payer: Self-pay | Admitting: Internal Medicine

## 2017-07-05 VITALS — BP 104/66 | HR 60 | Ht 60.0 in | Wt 246.0 lb

## 2017-07-05 DIAGNOSIS — R251 Tremor, unspecified: Secondary | ICD-10-CM

## 2017-07-05 DIAGNOSIS — G253 Myoclonus: Secondary | ICD-10-CM | POA: Diagnosis not present

## 2017-07-05 DIAGNOSIS — R2681 Unsteadiness on feet: Secondary | ICD-10-CM | POA: Diagnosis not present

## 2017-07-05 NOTE — Patient Instructions (Signed)
1. You have been referred to [facility] in [facility] at the [facility] for physical therapy. They will call you directly to schedule an appointment.  Please call [phone]  if you do not hear from them.   2. Hold Gabapentin.   3. Check your blood pressure at home.   4. We will call in 2 weeks to see if your "leg jerking" is better.

## 2017-07-05 NOTE — Assessment & Plan Note (Signed)
Allergy profile  10/06/16  >  Eos 0.2 /  IgE  4  RAST neg  Trial off propranolol 10/06/16 > no change  - 10/06/2016  After extensive coaching HFA effectiveness =    75%  - PFT's  11/28/2016  FEV1 1.54 (86 % ) ratio 77  p no % improvement from saba p nothing prior to study with DLCO  71/75  % corrects to 97  % for alv volume with mild exp curvature  - trial of gabapentin 100 tid 11/28/2016 > improved until uri > 01/12/2017 increase to 300 tid  - FENO 02/23/2017  =   Could not do  - Spirometry 02/23/2017  FEV1 1.32 (75%)  Ratio 72   - .flutter valve training 02/23/2017  - Sinus CT 02/24/2017 > Left ethmoid and left maxillary sinus mucosal thickening and fluid suggesting acute sinusitis >rx omnicef 300 mg twice daily x 10 days since amox reaction only a rash - MCT 03/08/17 POS reversible airflow obst > reproduced symptoms  - singuair trial 03/13/2017  - repeat sinus CT  03/14/2017 > 1. Improved left ethmoid sinusitis when compared to 02/24/2017. 2. Unchanged moderate mucosal thickening in the left maxillary Sinus.> rec   shoekaker eval > seen 03/21/17 rec flonase only    Despite rx with inderal for tremor, All goals of chronic asthma control met including optimal function and elimination of symptoms with minimal need for rescue therapy on just singulair 10 mg daily   Contingencies discussed in full including contacting this office immediately if not controlling the symptoms using the rule of two's.     Each maintenance medication was reviewed in detail including most importantly the difference between maintenance and as needed and under what circumstances the prns are to be used.  Please see AVS for specific  Instructions which are unique to this visit and I personally typed out  which were reviewed in detail in writing with the patient and a copy provided.    F/u is prn

## 2017-07-05 NOTE — Assessment & Plan Note (Signed)
Risks of high dose inderal reviewed/ f/u per Dr Carles Collet

## 2017-07-10 ENCOUNTER — Ambulatory Visit: Payer: Self-pay

## 2017-07-10 NOTE — Telephone Encounter (Signed)
Patient called in with c/o "weakness." She says "I've been feeling tired all over for a while and I just want to see Dr. Charlett Blake." I asked how severe, she says "I can still walk around." I asked about other symptoms, she says "nothing else." According to protocol, see PCP within 3 days, appointment scheduled for tomorrow at 1545 with Dr. Charlett Blake, care advice given, patient verbalized understanding.  Reason for Disposition . [1] MILD weakness (i.e., does not interfere with ability to work, go to school, normal activities) AND [2] persists > 1 week  Answer Assessment - Initial Assessment Questions 1. DESCRIPTION: "Describe how you are feeling."     Just weak and tired all over 2. SEVERITY: "How bad is it?"  "Can you stand and walk?"   - MILD - Feels weak or tired, but does not interfere with work, school or normal activities   - [place] to stand and walk; weakness interferes with work, school, or normal activities   - SEVERE - Unable to stand or walk     Mild 3. ONSET:  "When did the weakness begin?"     Going on for a while, getting worse 4. CAUSE: "What do you think is causing the weakness?"     I don't know 5. MEDICINES: "Have you recently started a new medicine or had a change in the amount of a medicine?"     Stopped taking gabapentin a few days ago 6. OTHER SYMPTOMS: "Do you have any other symptoms?" (e.g., chest pain, fever, cough, SOB, vomiting, diarrhea, bleeding)     No 7. PREGNANCY: "Is there any chance you are pregnant?" "When was your last menstrual period?"     No  Protocols used: WEAKNESS (GENERALIZED) AND FATIGUE-A-AH

## 2017-07-11 ENCOUNTER — Encounter: Payer: Self-pay | Admitting: Family Medicine

## 2017-07-11 ENCOUNTER — Ambulatory Visit (INDEPENDENT_AMBULATORY_CARE_PROVIDER_SITE_OTHER): Payer: Medicare Other | Admitting: Family Medicine

## 2017-07-11 VITALS — BP 139/54 | HR 60 | Temp 97.6°F | Resp 16 | Wt 243.8 lb

## 2017-07-11 DIAGNOSIS — E782 Mixed hyperlipidemia: Secondary | ICD-10-CM

## 2017-07-11 DIAGNOSIS — R5383 Other fatigue: Secondary | ICD-10-CM | POA: Diagnosis not present

## 2017-07-11 DIAGNOSIS — R1030 Lower abdominal pain, unspecified: Secondary | ICD-10-CM | POA: Diagnosis not present

## 2017-07-11 DIAGNOSIS — M109 Gout, unspecified: Secondary | ICD-10-CM

## 2017-07-11 DIAGNOSIS — I10 Essential (primary) hypertension: Secondary | ICD-10-CM | POA: Diagnosis not present

## 2017-07-11 DIAGNOSIS — R739 Hyperglycemia, unspecified: Secondary | ICD-10-CM | POA: Diagnosis not present

## 2017-07-11 NOTE — Progress Notes (Signed)
Subjective:  I acted as a Education administrator for [employer]. Amanda Copeland, [place]   Patient ID: Amanda Copeland, female    DOB: 01-27-1941, 77 y.o.   MRN: [id number]  Chief Complaint  Patient presents with  . Extremity Weakness    HPI  Patient is in today for weakness anPatient is accompanied by her husband and they are concerned about her fatigue with weakness. It is limiting her mobility and they do agree to proceed with PT which had been previously recommended. Her labs are largely unremarkable. She notes she recently stopped Gabapentin and some jerking she was having got better. She also acknowledges feeling frustrated by her current state but does not endorse depression. She denies any snoring. d she is accompanied by her husband who concurs. They deny any recent febrile illness or hospitalizations. Denies CP/palp/SOB/HA/congestion/fevers/GI or GU c/o. Taking meds as prescribed  Patient Care Team: [name], MD as PCP - General (Family Medicine) Druscilla Brownie, MD as Consulting Physician (Dermatology) Jettie Booze, MD as Consulting Physician (Cardiology) Tat, Eustace Quail, DO as Consulting Physician (Neurology) Calvert Cantor, MD as Consulting Physician (Ophthalmology) Tanda Rockers, MD as Consulting Physician (Pulmonary Disease)   Past Medical History:  Diagnosis Date  . Arthritis   . Asthma    childhood now returning  . Back pain affecting pregnancy 11/02/2014  . Benign essential tremor 10/28/2014  . Bilateral dry eyes   . Breast cancer ([facility])    right  . Cancer ([facility])    Breast  . Dry eyes   . Encounter for Medicare annual wellness exam 05/22/2016  . Essential hypertension, benign 10/28/2014  . H/O measles   . H/O mumps   . History of chicken pox   . Lumbago 11/02/2014  . Mixed hyperlipidemia 10/28/2014  . Obesity 10/28/2014    Past Surgical History:  Procedure Laterality Date  . BACK SURGERY    . BREAST REDUCTION SURGERY Left   . CHOLECYSTECTOMY    . EYE SURGERY  2008   b/l  cataracts removed, in [place]  . MASTECTOMY Right   . REDUCTION MAMMAPLASTY    . REPLACEMENT TOTAL KNEE BILATERAL Bilateral   . ROTATOR CUFF REPAIR Left     Family History  Problem Relation Age of Onset  . Heart disease Mother   . Heart attack Mother   . Heart failure Mother   . Hypertension Mother   . Cancer Father        colon  . Asthma Father   . Parkinson's disease Father   . Cancer Maternal Grandmother        uterine  . Heart disease Maternal Grandfather   . Obesity Daughter   . Appendicitis Paternal Grandmother   . Stroke Neg Hx     Social History   Socioeconomic History  . Marital status: Married    Spouse name: Not on file  . Number of children: Not on file  . Years of education: Not on file  . Highest education level: Not on file  Occupational History  . 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 Financial resource strain: Not on file  . Food insecurity:    Worry: Not on file    Inability: Not on file  . Transportation needs:    Medical: Not on file    Non-medical: Not on file  Tobacco Use  . Smoking status: Never Smoker  . Smokeless tobacco: Never Used  Substance and Sexual Activity  .  Alcohol use: Yes    Alcohol/week: 0.0 oz    Comment: two times a month  . Drug use: No  . Sexual activity: Not on file    Comment: lives with husband, moved [place], no dietary restrictions, retired Education officer, museum  Lifestyle  . Physical activity:    Days per week: Not on file    Minutes per session: Not on file  . Stress: Not on file  Relationships  . Social connections:    Talks on phone: Not on file    Gets together: Not on file    Attends religious service: Not on file    Active member of club or organization: Not on file    Attends meetings of clubs or organizations: Not on file    Relationship status: Not on file  . Intimate partner violence:    Fear of current or ex partner: Not on file    Emotionally abused: Not on file     Physically abused: Not on file    Forced sexual activity: Not on file  Other Topics Concern  . Not on file  Social History Narrative  . Not on file    Outpatient Medications Prior to Visit  Medication Sig Dispense Refill  . albuterol (PROAIR HFA) 108 (90 Base) MCG/ACT inhaler Inhale 2 puffs into the lungs every 6 (six) hours as needed for wheezing or shortness of breath.    . allopurinol (ZYLOPRIM) 100 MG tablet Take 1 tablet (100 mg total) by mouth daily. 180 tablet 0  . aspirin 81 MG tablet Take 81 mg by mouth daily.    Marland Kitchen diltiazem (TIAZAC) 180 MG 24 hr capsule Take 1 capsule (180 mg total) by mouth daily. 90 capsule 2  . fluticasone (FLONASE) 50 MCG/ACT nasal spray USE 2 SPRAYS IN EACH NOSTRIL AT BEDTIME  11  . lansoprazole (PREVACID) 30 MG capsule TAKE ONE CAPSULE BY MOUTH TWICE A DAY 60 capsule 0  . montelukast (SINGULAIR) 10 MG tablet TAKE 1 TABLET BY MOUTH EVERY DAY NIGHTLY 30 tablet 11  . Multiple Vitamins-Minerals (ICAPS AREDS 2) CAPS Take 1 capsule by mouth 2 (two) times daily.    . Potassium Chloride ER 20 MEQ TBCR Take 20 mEq daily by mouth. 90 tablet 3  . propranolol (INDERAL) 20 MG tablet TAKE 1 TABLET (20 MG TOTAL) BY MOUTH 2 (TWO) TIMES DAILY. 180 tablet 1  . Red Yeast Rice Extract (RED YEAST RICE PO) Take by mouth daily.    Marland Kitchen topiramate (TOPAMAX) 100 MG tablet Take 1 tablet (100 mg total) by mouth daily. 90 tablet 1  . gabapentin (NEURONTIN) 300 MG capsule TAKE 1 CAPSULE BY MOUTH THREE TIMES A DAY.MAX ON INSURANCE (Patient taking differently: TAKE 1 CAPSULE BY MOUTH TWO TIMES A DAY) 270 capsule 1  . furosemide (LASIX) 40 MG tablet Take 1 tablet (40 mg total) daily by mouth. 90 tablet 3   No facility-administered medications prior to visit.     Allergies  Allergen Reactions  . Amoxicillin Rash  . Ampicillin Rash  . Penicillins Rash  . Statins Other (See Comments)    Per reports muscle aches and joint pain    Review of Systems  Constitutional: Positive for  malaise/fatigue. Negative for fever.  HENT: Negative for congestion.   Eyes: Negative for blurred vision.  Respiratory: Negative for shortness of breath.   Cardiovascular: Negative for chest pain, palpitations and leg swelling.  Gastrointestinal: Negative for abdominal pain, blood in stool and nausea.  Genitourinary: Negative for dysuria  and frequency.  Musculoskeletal: Positive for back pain. Negative for falls.  Skin: Negative for rash.  Neurological: Positive for weakness. Negative for dizziness, loss of consciousness and headaches.  Endo/Heme/Allergies: Negative for environmental allergies.  Psychiatric/Behavioral: Negative for depression. The patient is not nervous/anxious.        Objective:    Physical Exam  Constitutional: She is oriented to person, place, and time. She appears well-developed and well-nourished. No distress.  HENT:  Head: Normocephalic and atraumatic.  Nose: Nose normal.  Eyes: Right eye exhibits no discharge. Left eye exhibits no discharge.  Neck: Normal range of motion. Neck supple.  Cardiovascular: Normal rate and regular rhythm.  No murmur heard. Pulmonary/Chest: Effort normal and breath sounds normal.  Abdominal: Soft. Bowel sounds are normal. There is no tenderness.  Musculoskeletal: She exhibits no edema.  Neurological: She is alert and oriented to person, place, and time.  Skin: Skin is warm and dry.  Psychiatric: She has a normal mood and affect.  Nursing note and vitals reviewed.   BP (!) 139/54 (BP Location: Left Arm, Patient Position: Sitting, Cuff Size: Large)   Pulse 60   Temp 97.6 F (36.4 C) (Oral)   Resp 16   Wt 243 lb 12.8 oz (110.6 kg)   BMI 47.61 kg/m  Wt Readings from Last 3 Encounters:  07/11/17 243 lb 12.8 oz (110.6 kg)  07/05/17 246 lb (111.6 kg)  07/04/17 247 lb (112 kg)   BP Readings from Last 3 Encounters:  07/11/17 (!) 139/54  07/05/17 104/66  07/04/17 128/76     Immunization History  Administered Date(s)  Administered  . Influenza, High Dose Seasonal PF 11/09/2015  . Influenza-Unspecified 09/07/2012, 10/09/2014, 11/03/2016  . Pneumococcal Conjugate-13 11/09/2015  . Pneumococcal Polysaccharide-23 03/03/2017  . Td 11/09/2015  . Zoster 02/08/2012    Health Maintenance  Topic Date Due  . INFLUENZA VACCINE  09/07/2017  . COLONOSCOPY  11/15/2017  . TETANUS/TDAP  11/08/2025  . DEXA SCAN  Completed  . PNA vac Low Risk Adult  Completed    Lab Results  Component Value Date   WBC 8.8 07/11/2017   HGB 13.5 07/11/2017   HCT 40.5 07/11/2017   PLT 350.0 07/11/2017   GLUCOSE 94 07/11/2017   CHOL 205 (H) 07/11/2017   TRIG 122.0 07/11/2017   HDL 57.60 07/11/2017   LDLDIRECT 115.0 10/27/2016   LDLCALC 123 (H) 07/11/2017   ALT 12 07/11/2017   AST 15 07/11/2017   NA 142 07/11/2017   K 4.2 07/11/2017   CL 106 07/11/2017   CREATININE 0.79 07/11/2017   BUN 19 07/11/2017   CO2 25 07/11/2017   TSH 3.04 07/11/2017   HGBA1C 5.9 03/03/2017    Lab Results  Component Value Date   TSH 3.04 07/11/2017   Lab Results  Component Value Date   WBC 8.8 07/11/2017   HGB 13.5 07/11/2017   HCT 40.5 07/11/2017   MCV 96.0 07/11/2017   PLT 350.0 07/11/2017   Lab Results  Component Value Date   NA 142 07/11/2017   K 4.2 07/11/2017   CO2 25 07/11/2017   GLUCOSE 94 07/11/2017   BUN 19 07/11/2017   CREATININE 0.79 07/11/2017   BILITOT 0.3 07/11/2017   ALKPHOS 89 07/11/2017   AST 15 07/11/2017   ALT 12 07/11/2017   PROT 6.3 07/11/2017   ALBUMIN 3.8 07/11/2017   CALCIUM 9.3 07/11/2017   GFR 74.89 07/11/2017   Lab Results  Component Value Date   CHOL 205 (H) 07/11/2017  Lab Results  Component Value Date   HDL 57.60 07/11/2017   Lab Results  Component Value Date   LDLCALC 123 (H) 07/11/2017   Lab Results  Component Value Date   TRIG 122.0 07/11/2017   Lab Results  Component Value Date   CHOLHDL 4 07/11/2017   Lab Results  Component Value Date   HGBA1C 5.9 03/03/2017          Assessment & Plan:   Problem List Items Addressed This Visit    Essential hypertension, benign - Primary    Well controlled, no changes to meds. Encouraged heart healthy diet such as the DASH diet and exercise as tolerated.       Relevant Orders   CBC (Completed)   Comprehensive metabolic panel (Completed)   TSH (Completed)   Mixed hyperlipidemia    Encouraged heart healthy diet, increase exercise, avoid trans fats, consider a krill oil cap daily      Relevant Orders   Lipid panel (Completed)   Lower abdominal pain   Relevant Orders   Urine Culture   Urinalysis   Hyperglycemia    hgba1c acceptable, minimize simple carbs. Increase exercise as tolerated.       Gout    No recent flares.       Relevant Orders   Uric acid (Completed)   Fatigue    Patient is accompanied by her husband and they are concerned about her fatigue with weakness. It is limiting her mobility and they do agree to proceed with PT which had been previously recommended. Her labs are largely unremarkable. She notes she recently stopped Gabapentin and some jerking she was having got better. She also acknowledges feeling frustrated by her current state but does not endorse depression. She denies any snoring. She will return after doing some PT and if no improvement will have to reconsider further work up         I have discontinued [place] gabapentin. I am also having her maintain her Red Yeast Rice Extract (RED YEAST RICE PO), aspirin, diltiazem, allopurinol, ICAPS AREDS 2, Potassium Chloride ER, furosemide, albuterol, topiramate, fluticasone, propranolol, montelukast, and lansoprazole.  No orders of the defined types were placed in this encounter.   CMA served as Education administrator during this visit. History, Physical and Plan performed by medical provider. Documentation and orders reviewed and attested to.  [name], MD

## 2017-07-11 NOTE — Patient Instructions (Signed)

## 2017-07-12 ENCOUNTER — Other Ambulatory Visit (INDEPENDENT_AMBULATORY_CARE_PROVIDER_SITE_OTHER): Payer: Medicare Other

## 2017-07-12 DIAGNOSIS — R1030 Lower abdominal pain, unspecified: Secondary | ICD-10-CM

## 2017-07-12 DIAGNOSIS — R5383 Other fatigue: Secondary | ICD-10-CM | POA: Insufficient documentation

## 2017-07-12 LAB — COMPREHENSIVE METABOLIC PANEL
ALBUMIN: 3.8 g/dL (ref 3.5–5.2)
ALT: 12 U/L (ref 0–35)
AST: 15 U/L (ref 0–37)
Alkaline Phosphatase: 89 U/L (ref 39–117)
BILIRUBIN TOTAL: 0.3 mg/dL (ref 0.2–1.2)
BUN: 19 mg/dL (ref 6–23)
CALCIUM: 9.3 mg/dL (ref 8.4–10.5)
CHLORIDE: 106 meq/L (ref 96–112)
CO2: 25 meq/L (ref 19–32)
CREATININE: 0.79 mg/dL (ref 0.40–1.20)
GFR: 74.89 mL/min (ref >60.00)
Glucose, Bld: 94 mg/dL (ref 70–99)
Potassium: 4.2 mEq/L (ref 3.5–5.1)
SODIUM: 142 meq/L (ref 135–145)
Total Protein: 6.3 g/dL (ref 6.0–8.3)

## 2017-07-12 LAB — LIPID PANEL
CHOL/HDL RATIO: 4
Cholesterol: 205 mg/dL — ABNORMAL HIGH (ref 0–200)
HDL: 57.6 mg/dL (ref >39.00)
LDL CALC: 123 mg/dL — AB (ref 0–99)
NonHDL: 147.09
TRIGLYCERIDES: 122 mg/dL (ref 0.0–149.0)
VLDL: 24.4 mg/dL (ref 0.0–40.0)

## 2017-07-12 LAB — URINALYSIS, ROUTINE W REFLEX MICROSCOPIC
Bilirubin Urine: NEGATIVE
HGB URINE DIPSTICK: NEGATIVE
Ketones, ur: NEGATIVE
Nitrite: NEGATIVE
RBC / HPF: NONE SEEN (ref 0–?)
Specific Gravity, Urine: 1.015 (ref 1.000–1.030)
TOTAL PROTEIN, URINE-UPE24: NEGATIVE
URINE GLUCOSE: NEGATIVE
Urobilinogen, UA: 0.2 (ref 0.0–1.0)
pH: 5.5 (ref 5.0–8.0)

## 2017-07-12 LAB — CBC
HCT: 40.5 % (ref 36.0–46.0)
Hemoglobin: 13.5 g/dL (ref 12.0–15.0)
MCHC: 33.4 g/dL (ref 30.0–36.0)
MCV: 96 fl (ref 78.0–100.0)
PLATELETS: 350 10*3/uL (ref 150.0–400.0)
RBC: 4.22 Mil/uL (ref 3.87–5.11)
RDW: 15.1 % (ref 11.5–15.5)
WBC: 8.8 10*3/uL (ref 4.0–10.5)

## 2017-07-12 LAB — TSH: TSH: 3.04 u[IU]/mL (ref 0.35–4.50)

## 2017-07-12 LAB — URIC ACID: URIC ACID, SERUM: 5.3 mg/dL (ref 2.4–7.0)

## 2017-07-12 NOTE — Assessment & Plan Note (Signed)
Encouraged heart healthy diet, increase exercise, avoid trans fats, consider a krill oil cap daily 

## 2017-07-12 NOTE — Assessment & Plan Note (Signed)
No recent flares 

## 2017-07-12 NOTE — Assessment & Plan Note (Signed)
hgba1c acceptable, minimize simple carbs. Increase exercise as tolerated.  

## 2017-07-12 NOTE — Assessment & Plan Note (Signed)
Well controlled, no changes to meds. Encouraged heart healthy diet such as the DASH diet and exercise as tolerated.  °

## 2017-07-12 NOTE — Assessment & Plan Note (Signed)
Patient is accompanied by her husband and they are concerned about her fatigue with weakness. It is limiting her mobility and they do agree to proceed with PT which had been previously recommended. Her labs are largely unremarkable. She notes she recently stopped Gabapentin and some jerking she was having got better. She also acknowledges feeling frustrated by her current state but does not endorse depression. She denies any snoring. She will return after doing some PT and if no improvement will have to reconsider further work up

## 2017-07-13 LAB — URINE CULTURE
MICRO NUMBER:: 90676281
SPECIMEN QUALITY:: ADEQUATE

## 2017-07-17 ENCOUNTER — Other Ambulatory Visit: Payer: Self-pay

## 2017-07-17 ENCOUNTER — Ambulatory Visit: Payer: Medicare Other | Attending: Neurology | Admitting: Physical Therapy

## 2017-07-17 DIAGNOSIS — R2681 Unsteadiness on feet: Secondary | ICD-10-CM | POA: Insufficient documentation

## 2017-07-17 DIAGNOSIS — R262 Difficulty in walking, not elsewhere classified: Secondary | ICD-10-CM | POA: Diagnosis not present

## 2017-07-17 DIAGNOSIS — M6281 Muscle weakness (generalized): Secondary | ICD-10-CM | POA: Diagnosis not present

## 2017-07-17 NOTE — Therapy (Signed)
[facility] [address]  [place], [place], [address] Phone: [phone]   Fax:  [phone]  Physical Therapy Evaluation  Patient Details  Name: Amanda Copeland MRN: [id number] Date of Birth: 10/26/1940 Referring Provider: Alonza Bogus, MD   Encounter Date: 07/17/2017  PT End of Session - 07/17/17 0920    Visit Number  1    Number of Visits  16    Date for PT Re-Evaluation  09/25/17    Authorization Type  Medicare & BCBS    PT Start Time  0920    PT Stop Time  1002    PT Time Calculation (min)  42 min    Activity Tolerance  Patient tolerated treatment well    Behavior During Therapy  [facility] for tasks assessed/performed       Past Medical History:  Diagnosis Date  . Arthritis   . Asthma    childhood now returning  . Back pain affecting pregnancy 11/02/2014  . Benign essential tremor 10/28/2014  . Bilateral dry eyes   . Breast cancer ([place])    right  . Cancer (HCC)    Breast  . Dry eyes   . Encounter for Medicare annual wellness exam 05/22/2016  . Essential hypertension, benign 10/28/2014  . H/O measles   . H/O mumps   . History of chicken pox   . Lumbago 11/02/2014  . Mixed hyperlipidemia 10/28/2014  . Obesity 10/28/2014    Past Surgical History:  Procedure Laterality Date  . BACK SURGERY    . BREAST REDUCTION SURGERY Left   . CHOLECYSTECTOMY    . EYE SURGERY  2008   b/l cataracts removed, in [place]  . MASTECTOMY Right   . REDUCTION MAMMAPLASTY    . REPLACEMENT TOTAL KNEE BILATERAL Bilateral   . ROTATOR CUFF REPAIR Left     There were no vitals filed for this visit.   Subjective Assessment - 07/17/17 0923    Subjective  Pt reports history of benign essential tremor - started initially in hands and is now progressing to voice and legs creating increased unsteadiness. Reports 2 recent falls. Notes she feels "tired all the time".    How long can you stand comfortably?  <5 minutes    How long can you  walk comfortably?  <5 minutes    Patient Stated Goals  "get moving - build up some strength and feel steadier on my feet"    Currently in Pain?  No/denies         [facility] PT Assessment - 07/17/17 0920      Assessment   Medical Diagnosis  Gait instability d/t benign essential tremor    Referring Provider  Alonza Bogus, MD    Onset Date/Surgical Date  -- ~5-6 yrs since tremor diagnosed    Next MD Visit  Aug 2019    Prior Therapy  PT for LBP & weakness x 2 epsiodes      Precautions   Precautions  Fall      Balance Screen   Has the patient fallen in the past 6 months  Yes    How many times?  2    Has the patient had a decrease in activity level because of a fear of falling?   No    Is the patient reluctant to leave their home because of a fear of falling?   No      Home Film/video editor residence  Living Arrangements  Spouse/significant other    Type of [place] Access  Level entry    [place] - single point only use in community esp where there might be stairs      Prior Function   Level of Independence  Independent    Vocation  Retired    Leisure  mostly sedentary - movies, visit with dtr/grandkids, outdoor pool 3-4x/wk when weather allows      Cognition   Overall Cognitive Status  Within Functional Limits for tasks assessed      ROM / Strength   AROM / PROM / Strength  Strength      Strength   Strength Assessment Site  Hip;Knee;Ankle    Right/Left Hip  Right;Left    Right Hip Flexion  3+/5    Right Hip Extension  3+/5    Right Hip External Rotation   3+/5    Right Hip Internal Rotation  4-/5    Right Hip ABduction  3-/5    Right Hip ADduction  3-/5    Left Hip Flexion  3+/5    Left Hip Extension  3-/5    Left Hip External Rotation  3+/5    Left Hip Internal Rotation  4-/5    Left Hip ABduction  3/5    Left Hip ADduction  3-/5    Right/Left Knee  Right;Left    Right Knee Flexion  4-/5     Right Knee Extension  3+/5    Left Knee Flexion  4-/5    Left Knee Extension  3+/5    Right/Left Ankle  Right;Left    Right Ankle Dorsiflexion  3+/5    Right Ankle Plantar Flexion  3/5    Left Ankle Dorsiflexion  3+/5    Left Ankle Plantar Flexion  2/5      Ambulation/Gait   Ambulation/Gait  Yes    Ambulation/Gait Assistance  5: Supervision    Ambulation Distance (Feet)  80 Feet    Assistive device  None    Gait Pattern  Wide base of support;Step-through pattern;Decreased stride length;Decreased arm swing - right;Decreased arm swing - left;Decreased trunk rotation    Ambulation Surface  Level;Indoor    Gait velocity  2.32 ft/sec      Standardized Balance Assessment   Standardized Balance Assessment  Berg Balance Test;Timed Up and Go Test;10 meter walk test;Five Times Sit to Stand    Five times sit to stand comments   26.56    10 Meter Walk  14.16"      Berg Balance Test   Sit to Stand  Able to stand  independently using hands    Standing Unsupported  Able to stand 2 minutes with supervision    Sitting with Back Unsupported but Feet Supported on Floor or Stool  Able to sit safely and securely 2 minutes    Stand to Sit  Uses backs of legs against chair to control descent    Transfers  Able to transfer safely, minor use of hands    Standing Unsupported with Eyes Closed  Able to stand 10 seconds with supervision    Standing Ubsupported with Feet Together  Able to place feet together independently and stand for 1 minute with supervision unable to get feet together d/t leg girth    From Standing, Reach Forward with Outstretched Arm  Can reach forward >5 cm safely (2")    From Standing  Position, Pick up Object from Floor  Unable to pick up shoe, but reaches 2-5 cm (1-2") from shoe and balances independently    From Standing Position, Turn to Look Behind Over each Shoulder  Turn sideways only but maintains balance    Turn 360 Degrees  Able to turn 360 degrees safely but slowly     Standing Unsupported, Alternately Place Feet on Step/Stool  Needs assistance to keep from falling or unable to try    Standing Unsupported, One Foot in Front  Needs help to step but can hold 15 seconds    Standing on One Leg  Unable to try or needs assist to prevent fall    Total Score  31    Berg comment:  < 36 high risk for falls (close to 100%)      Timed Up and Go Test   Normal TUG (seconds)  19.19      Functional Gait  Assessment   Gait assessed   Yes    Gait Level Surface  Walks 20 ft, slow speed, abnormal gait pattern, evidence for imbalance or deviates 10-15 in outside of the 12 in walkway width. Requires more than 7 sec to ambulate 20 ft.    Change in Gait Speed  Makes only minor adjustments to walking speed, or accomplishes a change in speed with significant gait deviations, deviates 10-15 in outside the 12 in walkway width, or changes speed but loses balance but is able to recover and continue walking.    Gait with Horizontal Head Turns  Performs head turns with moderate changes in gait velocity, slows down, deviates 10-15 in outside 12 in walkway width but recovers, can continue to walk.    Gait with Vertical Head Turns  Performs task with severe disruption of gait (eg, staggers 15 in outside 12 in walkway width, loses balance, stops, reaches for wall).    Gait and Pivot Turn  Turns slowly, requires verbal cueing, or requires several small steps to catch balance following turn and stop    Step Over Obstacle  Cannot perform without assistance.    Gait with Narrow Base of Support  Ambulates less than 4 steps heel to toe or cannot perform without assistance.    Gait with Eyes Closed  Cannot walk 20 ft without assistance, severe gait deviations or imbalance, deviates greater than 15 in outside 12 in walkway width or will not attempt task.    Ambulating Backwards  Cannot walk 20 ft without assistance, severe gait deviations or imbalance, deviates greater than 15 in outside 12 in walkway  width or will not attempt task.    Steps  Cannot do safely.    Total Score  4    FGA comment:  < 19 = high risk fal                Objective measurements completed on examination: See above findings.                PT Short Term Goals - 07/17/17 1021      PT SHORT TERM GOAL #1   Title  Independent with initial HEP ([place] program at supported level as inidicated)    Status  New    Target Date  08/14/17      PT SHORT TERM GOAL #2   Title  Increase overall LE strength by 1/2 - 1 grade for improved stability    Status  New    Target Date  08/21/17  PT SHORT TERM GOAL #3   Title  Increase Berg to 38/56 to reduce fall risk    Status  New    Target Date  08/21/17        PT Long Term Goals - 07/17/17 1023      PT LONG TERM GOAL #1   Title  Independent with advanced/ongoing HEP    Status  New    Target Date  09/25/17      PT LONG TERM GOAL #2   Title  Increase overall LE strength to >/= 4-/5 to 4/5 for improved stability and activity tolerance    Status  New    Target Date  09/25/17      PT LONG TERM GOAL #3   Title  Increase Berg to 46/56 to reduce risk for falls    Status  New    Target Date  09/25/17      PT LONG TERM GOAL #4   Title  Decrease TUG to </=13.5 sec to increase safety with transitions    Status  New    Target Date  09/25/17      PT LONG TERM GOAL #5   Title  Increase FGA to >/= 19/30 to decrease risk for falls with community ambulation    Status  New    Target Date  09/25/17             Plan - 07/17/17 1001    Clinical Impression Statement  Amanda Copeland is a 77 y/o female who presents to OP PT for worsening gait instability due progression of benign essential tremor and weakness. Pt reports tremor initiated in her hands ~5-6 yrs ago, but now progressing to her voice and LEs. She has had 3 falls in past 2 yrs, with 2 in past 6 months. Moderate to severe LE weakness present in B LE's on MMT. Gait speed 2.32 ft/sec indicating  only limited community ambulation with all standardized balance testing revealing very high risk for falls. Pt reports she does sometimes use a cane or her husband's arm for community ambulation, esp with stairs, but arrives to PT w/o AD. Pt will benefit from skilled PT to address deficits listed to increase strength and balance for increased ease of mobility and improved gait stability to decrease risk for falls.    History and Personal Factors relevant to plan of care:  complex medical history as above    Clinical Presentation  Evolving    Clinical Presentation due to:  progressive weakness and gait instability with 2 falls in past 6 months and high fall risk per standardized testing    Clinical Decision Making  Moderate    Rehab Potential  Good    PT Frequency  2x / week    PT Duration  8 weeks    PT Treatment/Interventions  Patient/family education;ADLs/Self Care Home Management;Neuromuscular re-education;Therapeutic exercise;Therapeutic activities;Functional mobility training;Gait training;Stair training;Manual techniques;Vestibular    Consulted and Agree with Plan of Care  Patient       Patient will benefit from skilled therapeutic intervention in order to improve the following deficits and impairments:  Decreased balance, Decreased strength, Decreased coordination, Decreased activity tolerance, Difficulty walking, Abnormal gait, Decreased mobility, Decreased endurance  Visit Diagnosis: Unsteadiness on feet  Difficulty in walking, not elsewhere classified  Muscle weakness (generalized)     Problem List Patient Active Problem List   Diagnosis Date Noted  . Fatigue 07/12/2017  . Recurrent falls 03/05/2017  . Chronic diastolic heart failure ([place]) 11/29/2016  .  Dyspnea on exertion 10/06/2016  . Cough variant asthma  vs UACS 10/06/2016  . Encounter for Medicare annual wellness exam 05/22/2016  . Hyperglycemia 08/07/2015  . Allergic 08/07/2015  . Gout 08/07/2015  . Leukocytosis  08/07/2015  . Lower abdominal pain 04/21/2015  . Back pain 11/02/2014  . Morbid obesity due to excess calories ([place]) 10/28/2014  . Esophageal reflux 10/28/2014  . Essential hypertension, benign 10/28/2014  . Mixed hyperlipidemia 10/28/2014  . Benign essential tremor 10/28/2014  . Bilateral dry eyes   . Asthma   . Arthritis   . Cancer ([place])   . History of chicken pox     Percival Spanish, PT, MPT 07/17/2017, 10:35 AM  [facility] [address]  [place], [place], [address] Phone: [phone]   Fax:  [phone]  Name: Amanda Copeland MRN: [id number] Date of Birth: [birth year]

## 2017-07-18 ENCOUNTER — Other Ambulatory Visit: Payer: Self-pay | Admitting: Family Medicine

## 2017-07-19 ENCOUNTER — Encounter: Payer: Self-pay | Admitting: Physical Therapy

## 2017-07-19 ENCOUNTER — Ambulatory Visit: Payer: Medicare Other | Admitting: Physical Therapy

## 2017-07-19 DIAGNOSIS — M6281 Muscle weakness (generalized): Secondary | ICD-10-CM | POA: Diagnosis not present

## 2017-07-19 DIAGNOSIS — R262 Difficulty in walking, not elsewhere classified: Secondary | ICD-10-CM | POA: Diagnosis not present

## 2017-07-19 DIAGNOSIS — R2681 Unsteadiness on feet: Secondary | ICD-10-CM

## 2017-07-19 NOTE — Therapy (Signed)
[facility] [address]  [place], [place], [address] Phone: [phone]   Fax:  [phone]  Physical Therapy Treatment  Patient Details  Name: Amanda Copeland MRN: [id number] Date of Birth: [birth year] Referring Provider: Alonza Bogus, MD   Encounter Date: 07/19/2017  PT End of Session - 07/19/17 0937    Visit Number  1    Number of Visits  16    Date for PT Re-Evaluation  09/25/17    Authorization Type  Medicare & BCBS    PT Start Time  [phone]    PT Stop Time  1024    PT Time Calculation (min)  47 min    Activity Tolerance  Patient tolerated treatment well    Behavior During Therapy  [facility] for tasks assessed/performed       Past Medical History:  Diagnosis Date  . Arthritis   . Asthma    childhood now returning  . Back pain affecting pregnancy 11/02/2014  . Benign essential tremor 10/28/2014  . Bilateral dry eyes   . Breast cancer ([place])    right  . Cancer (HCC)    Breast  . Dry eyes   . Encounter for Medicare annual wellness exam 05/22/2016  . Essential hypertension, benign 10/28/2014  . H/O measles   . H/O mumps   . History of chicken pox   . Lumbago 11/02/2014  . Mixed hyperlipidemia 10/28/2014  . Obesity 10/28/2014    Past Surgical History:  Procedure Laterality Date  . BACK SURGERY    . BREAST REDUCTION SURGERY Left   . CHOLECYSTECTOMY    . EYE SURGERY  2008   b/l cataracts removed, in [place]  . MASTECTOMY Right   . REDUCTION MAMMAPLASTY    . REPLACEMENT TOTAL KNEE BILATERAL Bilateral   . ROTATOR CUFF REPAIR Left     There were no vitals filed for this visit.  Subjective Assessment - 07/19/17 0940    Subjective  Pt doing well today.    How long can you stand comfortably?  <5 minutes    How long can you walk comfortably?  <5 minutes    Patient Stated Goals  "get moving - build up some strength and feel steadier on my feet"    Currently in Pain?  No/denies                        [facility] Adult PT Treatment/Exercise - 07/19/17 9485      Exercises   Exercises  Knee/Hip      Knee/Hip Exercises: Aerobic   Nustep  L3 x 5' (B UE/LE)          Balance Exercises - 07/19/17 0937      OTAGO PROGRAM   Head Movements  Standing;5 reps    Neck Movements  Standing;5 reps    Back Extension  Standing;5 reps    Trunk Movements  Standing;5 reps    Ankle Movements  Sitting;10 reps    Knee Extensor  10 reps    Knee Flexor  10 reps    Hip ABductor  10 reps    Ankle Plantorflexors  20 reps, support    Ankle Dorsiflexors  20 reps, support    Knee Bends  10 reps, support    Backwards Walking  Support deferred from HEP    Walking and Turning Around  No assistive device CGA of PT with gait belt; deferred from HEP    Sideways  Walking  Assistive device counter support, cues for foot alignment; deferred from HEP          PT Short Term Goals - 07/19/17 0942      PT SHORT TERM GOAL #1   Title  Independent with initial HEP ([place] program at supported level as inidicated)    Status  On-going      PT SHORT TERM GOAL #2   Title  Increase overall LE strength by 1/2 - 1 grade for improved stability    Status  On-going      PT SHORT TERM GOAL #3   Title  Increase Berg to 38/56 to reduce fall risk    Status  On-going        PT Long Term Goals - 07/19/17 0943      PT LONG TERM GOAL #1   Title  Independent with advanced/ongoing HEP    Status  On-going      PT LONG TERM GOAL #2   Title  Increase overall LE strength to >/= 4-/5 to 4/5 for improved stability and activity tolerance    Status  On-going      PT LONG TERM GOAL #3   Title  Increase Berg to 46/56 to reduce risk for falls    Status  On-going      PT LONG TERM GOAL #4   Title  Decrease TUG to </=13.5 sec to increase safety with transitions    Status  On-going      PT LONG TERM GOAL #5   Title  Increase FGA to >/= 19/30 to decrease risk for falls with community ambulation     Status  On-going            Plan - 07/19/17 0950    Clinical Impression Statement  Otago exercises provided today and select exercises given as HEP. Pt tolerated treatment session well today with increased fatigue noted at end of session. Pt demonstrated difficulty with LE strengthing activities evidenced by increased fatigue with otago exercises and difficulty with balance activities as shown by increased sway with otago figure eight gait exercise; therefore these activities deferred from HEP at this time. Pt will continue to benefit from physical therapy to address impairments and progress towards functional goals.     Rehab Potential  Good    PT Frequency  2x / week    PT Duration  8 weeks    PT Treatment/Interventions  Patient/family education;ADLs/Self Care Home Management;Neuromuscular re-education;Therapeutic exercise;Therapeutic activities;Functional mobility training;Gait training;Stair training;Manual techniques;Vestibular    Consulted and Agree with Plan of Care  Patient       Patient will benefit from skilled therapeutic intervention in order to improve the following deficits and impairments:  Decreased balance, Decreased strength, Decreased coordination, Decreased activity tolerance, Difficulty walking, Abnormal gait, Decreased mobility, Decreased endurance  Visit Diagnosis: Unsteadiness on feet  Difficulty in walking, not elsewhere classified  Muscle weakness (generalized)     Problem List Patient Active Problem List   Diagnosis Date Noted  . Fatigue 07/12/2017  . Recurrent falls 03/05/2017  . Chronic diastolic heart failure ([place]) 11/29/2016  . Dyspnea on exertion 10/06/2016  . Cough variant asthma  vs UACS 10/06/2016  . Encounter for Medicare annual wellness exam 05/22/2016  . Hyperglycemia 08/07/2015  . Allergic 08/07/2015  . Gout 08/07/2015  . Leukocytosis 08/07/2015  . Lower abdominal pain 04/21/2015  . Back pain 11/02/2014  . Morbid obesity due to  excess calories ([place]) 10/28/2014  . Esophageal reflux 10/28/2014  .  Essential hypertension, benign 10/28/2014  . Mixed hyperlipidemia 10/28/2014  . Benign essential tremor 10/28/2014  . Bilateral dry eyes   . Asthma   . Arthritis   . Cancer ([place])   . History of chicken pox     Shirline Frees, SPT 07/19/2017, 1:02 PM  [facility] [address]  [place], [place], [address] Phone: [phone]   Fax:  [phone]  Name: Amanda Copeland MRN: [id number] Date of Birth: [birth year]

## 2017-07-20 ENCOUNTER — Telehealth: Payer: Self-pay | Admitting: Neurology

## 2017-07-20 NOTE — Telephone Encounter (Signed)
-----   Message from Annamaria Helling, [place] sent at 07/05/2017 10:13 AM EDT ----- See if leg jerking is better after holding gabapentin  See how blood pressures have been running

## 2017-07-20 NOTE — Telephone Encounter (Signed)
Tried to call patient. There was an answer and then phone went silent. Will try again later.

## 2017-07-20 NOTE — Telephone Encounter (Signed)
Spoke with patient. She is doing much better off Gabapentin. She states jerking is gone. Walking is improved. She admits to not taking her blood pressures at home as instructed. It did run 139/54 at her PCP office a few days ago.   I instructed patient to stay off Gabapentin and let us know about any changes.  Dr. Carles Collet Juluis Rainier.

## 2017-07-24 ENCOUNTER — Encounter: Payer: Medicare Other | Admitting: Physical Therapy

## 2017-08-03 ENCOUNTER — Ambulatory Visit: Payer: Medicare Other | Admitting: Physical Therapy

## 2017-08-07 ENCOUNTER — Ambulatory Visit: Payer: Medicare Other | Attending: Neurology | Admitting: Physical Therapy

## 2017-08-07 DIAGNOSIS — M6281 Muscle weakness (generalized): Secondary | ICD-10-CM | POA: Insufficient documentation

## 2017-08-07 DIAGNOSIS — R262 Difficulty in walking, not elsewhere classified: Secondary | ICD-10-CM | POA: Insufficient documentation

## 2017-08-07 DIAGNOSIS — R2681 Unsteadiness on feet: Secondary | ICD-10-CM | POA: Insufficient documentation

## 2017-08-07 NOTE — Therapy (Signed)
[facility] [address]  [place], [place], [address] Phone: [phone]   Fax:  [phone]  Physical Therapy Treatment  Patient Details  Name: Amanda Copeland MRN: [id number] Date of Birth: 1940/07/10 Referring Provider: Alonza Bogus, MD   Encounter Date: 08/07/2017  PT End of Session - 08/07/17 1025    Visit Number  3    Number of Visits  16    Date for PT Re-Evaluation  09/25/17    Authorization Type  Medicare & BCBS    PT Start Time  0930    PT Stop Time  1016    PT Time Calculation (min)  46 min    Equipment Utilized During Treatment  Gait belt    Activity Tolerance  Patient tolerated treatment well;Patient limited by fatigue    Behavior During Therapy  [facility] for tasks assessed/performed       Past Medical History:  Diagnosis Date  . Arthritis   . Asthma    childhood now returning  . Back pain affecting pregnancy 11/02/2014  . Benign essential tremor 10/28/2014  . Bilateral dry eyes   . Breast cancer ([place])    right  . Cancer (HCC)    Breast  . Dry eyes   . Encounter for Medicare annual wellness exam 05/22/2016  . Essential hypertension, benign 10/28/2014  . H/O measles   . H/O mumps   . History of chicken pox   . Lumbago 11/02/2014  . Mixed hyperlipidemia 10/28/2014  . Obesity 10/28/2014    Past Surgical History:  Procedure Laterality Date  . BACK SURGERY    . BREAST REDUCTION SURGERY Left   . CHOLECYSTECTOMY    . EYE SURGERY  2008   b/l cataracts removed, in [place]  . MASTECTOMY Right   . REDUCTION MAMMAPLASTY    . REPLACEMENT TOTAL KNEE BILATERAL Bilateral   . ROTATOR CUFF REPAIR Left     There were no vitals filed for this visit.  Subjective Assessment - 08/07/17 1022    Subjective  Pt reports that some exercises from [place] booklet were making her L hip and low back hurt so she decreased the frequency that she was performing exercises.  Was traveling recently and unable to do her exercises  as much because she was in the car a lot. Goes to the pool a lot with her grandsons and has experienced some difficulty with going up the 3 stairs to get out of the pool.    Patient Stated Goals  "get moving - build up some strength and feel steadier on my feet"    Currently in Pain?  No/denies                       [facility] Adult PT Treatment/Exercise - 08/07/17 0001      Knee/Hip Exercises: Aerobic   Nustep  L4 x 6 min (LE only)          Balance Exercises - 08/07/17 1000      [place] PROGRAM   Tandem Stance  10 seconds, support    Tandem Walk  Support    One Leg Stand  10 seconds, support    Heel Walking  -- Unable to perform today    Toe Walk  Support    Sit to Stand  10 reps, no support    Stair Walking  Pt climbed 5 steps with SPC in R UE and using railing in L UE. Descends  stairs holding onto rail with both hands and stepping down sideways. Descended 3 steps walking forward that caused dizziness that lasted approx. 20 seconds.        PT Education - 08/07/17 1024    Education Details  Pt educated on community resources available to increase her activity levels     Person(s) Educated  Patient    Methods  Handout    Comprehension  Verbalized understanding       PT Short Term Goals - 07/19/17 0942      PT SHORT TERM GOAL #1   Title  Independent with initial HEP ([place] program at supported level as inidicated)    Status  On-going      PT SHORT TERM GOAL #2   Title  Increase overall LE strength by 1/2 - 1 grade for improved stability    Status  On-going      PT SHORT TERM GOAL #3   Title  Increase Berg to 38/56 to reduce fall risk    Status  On-going        PT Long Term Goals - 07/19/17 0943      PT LONG TERM GOAL #1   Title  Independent with advanced/ongoing HEP    Status  On-going      PT LONG TERM GOAL #2   Title  Increase overall LE strength to >/= 4-/5 to 4/5 for improved stability and activity tolerance    Status  On-going      PT LONG  TERM GOAL #3   Title  Increase Berg to 46/56 to reduce risk for falls    Status  On-going      PT LONG TERM GOAL #4   Title  Decrease TUG to </=13.5 sec to increase safety with transitions    Status  On-going      PT LONG TERM GOAL #5   Title  Increase FGA to >/= 19/30 to decrease risk for falls with community ambulation    Status  On-going            Plan - 08/07/17 1035    Clinical Impression Statement  Session today focused on [place] exercises and finishing instructing each exercise to eventually phase in as part of HEP exercise. Pt continues to demonstrate difficulty with activities that challenge LE strength and ability to balance such as tandem stance and stair navigation and demonstrates significant glute weakness when ascending/descending stairs. Pt continues to be significantly limited by fatigue. Pt will continue to benefit from physical therapy to address these impairments, improve mobility, and progress towards functional goals.    Rehab Potential  Good    PT Treatment/Interventions  Patient/family education;ADLs/Self Care Home Management;Neuromuscular re-education;Therapeutic exercise;Therapeutic activities;Functional mobility training;Gait training;Stair training;Manual techniques;Vestibular    Consulted and Agree with Plan of Care  Patient       Patient will benefit from skilled therapeutic intervention in order to improve the following deficits and impairments:  Decreased balance, Decreased strength, Decreased coordination, Decreased activity tolerance, Difficulty walking, Abnormal gait, Decreased mobility, Decreased endurance  Visit Diagnosis: Unsteadiness on feet  Difficulty in walking, not elsewhere classified  Muscle weakness (generalized)  Bilateral low back pain, unspecified chronicity, with sciatica presence unspecified  Abnormal posture     Problem List Patient Active Problem List   Diagnosis Date Noted  . Fatigue 07/12/2017  . Recurrent falls  03/05/2017  . Chronic diastolic heart failure ([place]) 11/29/2016  . Dyspnea on exertion 10/06/2016  . Cough variant asthma  vs UACS 10/06/2016  .  Encounter for Medicare annual wellness exam 05/22/2016  . Hyperglycemia 08/07/2015  . Allergic 08/07/2015  . Gout 08/07/2015  . Leukocytosis 08/07/2015  . Lower abdominal pain 04/21/2015  . Back pain 11/02/2014  . Morbid obesity due to excess calories ([place]) 10/28/2014  . Esophageal reflux 10/28/2014  . Essential hypertension, benign 10/28/2014  . Mixed hyperlipidemia 10/28/2014  . Benign essential tremor 10/28/2014  . Bilateral dry eyes   . Asthma   . Arthritis   . Cancer ([place])   . History of chicken pox     Shirline Frees, SPT 08/07/2017, 10:43 AM  [facility] [address]  [place], [place], [address] Phone: [phone]   Fax:  [phone]  Name: Amanda Copeland MRN: [id number] Date of Birth: 05-Feb-1941

## 2017-08-07 NOTE — Patient Instructions (Signed)

## 2017-08-08 ENCOUNTER — Other Ambulatory Visit: Payer: Self-pay | Admitting: Family Medicine

## 2017-08-08 DIAGNOSIS — Z961 Presence of intraocular lens: Secondary | ICD-10-CM | POA: Diagnosis not present

## 2017-08-08 DIAGNOSIS — H01001 Unspecified blepharitis right upper eyelid: Secondary | ICD-10-CM | POA: Diagnosis not present

## 2017-08-08 DIAGNOSIS — H01002 Unspecified blepharitis right lower eyelid: Secondary | ICD-10-CM | POA: Diagnosis not present

## 2017-08-08 DIAGNOSIS — H524 Presbyopia: Secondary | ICD-10-CM | POA: Diagnosis not present

## 2017-08-08 DIAGNOSIS — H04123 Dry eye syndrome of bilateral lacrimal glands: Secondary | ICD-10-CM | POA: Diagnosis not present

## 2017-08-09 ENCOUNTER — Ambulatory Visit: Payer: Medicare Other | Admitting: Physical Therapy

## 2017-08-09 DIAGNOSIS — R262 Difficulty in walking, not elsewhere classified: Secondary | ICD-10-CM | POA: Diagnosis not present

## 2017-08-09 DIAGNOSIS — M6281 Muscle weakness (generalized): Secondary | ICD-10-CM

## 2017-08-09 DIAGNOSIS — R2681 Unsteadiness on feet: Secondary | ICD-10-CM | POA: Diagnosis not present

## 2017-08-09 NOTE — Therapy (Signed)
[facility] [address]  [place], [place], [address] Phone: [phone]   Fax:  [phone]  Physical Therapy Treatment  Patient Details  Name: Amanda Copeland MRN: [id number] Date of Birth: May 05, 1940 Referring Provider: Alonza Bogus, MD   Encounter Date: 08/09/2017  PT End of Session - 08/09/17 1020    Visit Number  4    Number of Visits  16    Date for PT Re-Evaluation  09/25/17    Authorization Type  Medicare & BCBS    PT Start Time  0930    PT Stop Time  1011    PT Time Calculation (min)  41 min    Activity Tolerance  Patient tolerated treatment well    Behavior During Therapy  [facility] for tasks assessed/performed       Past Medical History:  Diagnosis Date  . Arthritis   . Asthma    childhood now returning  . Back pain affecting pregnancy 11/02/2014  . Benign essential tremor 10/28/2014  . Bilateral dry eyes   . Breast cancer ([place])    right  . Cancer (HCC)    Breast  . Dry eyes   . Encounter for Medicare annual wellness exam 05/22/2016  . Essential hypertension, benign 10/28/2014  . H/O measles   . H/O mumps   . History of chicken pox   . Lumbago 11/02/2014  . Mixed hyperlipidemia 10/28/2014  . Obesity 10/28/2014    Past Surgical History:  Procedure Laterality Date  . BACK SURGERY    . BREAST REDUCTION SURGERY Left   . CHOLECYSTECTOMY    . EYE SURGERY  2008   b/l cataracts removed, in [place]  . MASTECTOMY Right   . REDUCTION MAMMAPLASTY    . REPLACEMENT TOTAL KNEE BILATERAL Bilateral   . ROTATOR CUFF REPAIR Left     There were no vitals filed for this visit.  Subjective Assessment - 08/09/17 0936    Subjective  Pt reports that she did not have a chance to perform her HEP exercises yesterday but went to the movies and did well with going up and down stairs in the theatre. Had an easier time getting out of the pool on Tuesday.     How long can you stand comfortably?  5 minutes    How long can  you walk comfortably?  5 minutes    Patient Stated Goals  "get moving - build up some strength and feel steadier on my feet"    Currently in Pain?  No/denies                       [facility] Adult PT Treatment/Exercise - 08/09/17 0001      Knee/Hip Exercises: Aerobic   Nustep  L4 x 6 min (LE only)      Knee/Hip Exercises: Standing   Functional Squat  10 reps;Limitations    Functional Squat Limitations  Standing holding countertop for support    Other Standing Knee Exercises  Hamstring Curl; 10 reps each LE; 2# ankle weights    Other Standing Knee Exercises  Donkey kicks; 10 reps each LE; 2# weight      Knee/Hip Exercises: Seated   Long Arc Quad  10 reps;Weights;Both    Long Arc Quad Weight  2 lbs.    Other Seated Knee/Hip Exercises  Fitter Leg Press; 2 x 10 reps each LE; 2 blue bands    Other Seated Knee/Hip Exercises  Toe/Heel  Raises; 10 reps    Sit to Sand  2 sets;10 reps      Knee/Hip Exercises: Supine   Hip Adduction Isometric  20 reps;Both;Limitations    Hip Adduction Isometric Limitations  Green ball between knees    Bridges  2 sets;Limitations;Other (comment);Strengthening    Bridges Limitations  Able to achieve about half of available ROM    Other Supine Knee/Hip Exercises  Hooklying Marching; 10 reps on each LE      Knee/Hip Exercises: Sidelying   Clams  10 each LE               PT Short Term Goals - 07/19/17 1610      PT SHORT TERM GOAL #1   Title  Independent with initial HEP ([place] program at supported level as inidicated)    Status  On-going      PT SHORT TERM GOAL #2   Title  Increase overall LE strength by 1/2 - 1 grade for improved stability    Status  On-going      PT SHORT TERM GOAL #3   Title  Increase Berg to 38/56 to reduce fall risk    Status  On-going        PT Long Term Goals - 07/19/17 0943      PT LONG TERM GOAL #1   Title  Independent with advanced/ongoing HEP    Status  On-going      PT LONG TERM GOAL #2   Title   Increase overall LE strength to >/= 4-/5 to 4/5 for improved stability and activity tolerance    Status  On-going      PT LONG TERM GOAL #3   Title  Increase Berg to 46/56 to reduce risk for falls    Status  On-going      PT LONG TERM GOAL #4   Title  Decrease TUG to </=13.5 sec to increase safety with transitions    Status  On-going      PT LONG TERM GOAL #5   Title  Increase FGA to >/= 19/30 to decrease risk for falls with community ambulation    Status  On-going            Plan - 08/09/17 1021    Clinical Impression Statement  Session today focused on LE strengthening and pt tolerated session well with 1 episode of dizziness when sitting from supine. Pt demonstrates significant fatigue with low level strengthening activities and will continue to benefit from physical therapy to improve LE strength to improve functional mobility.     Rehab Potential  Good    PT Treatment/Interventions  Patient/family education;ADLs/Self Care Home Management;Neuromuscular re-education;Therapeutic exercise;Therapeutic activities;Functional mobility training;Gait training;Stair training;Manual techniques;Vestibular    Consulted and Agree with Plan of Care  Patient       Patient will benefit from skilled therapeutic intervention in order to improve the following deficits and impairments:  Decreased balance, Decreased strength, Decreased coordination, Decreased activity tolerance, Difficulty walking, Abnormal gait, Decreased mobility, Decreased endurance  Visit Diagnosis: Unsteadiness on feet  Difficulty in walking, not elsewhere classified  Muscle weakness (generalized)     Problem List Patient Active Problem List   Diagnosis Date Noted  . Fatigue 07/12/2017  . Recurrent falls 03/05/2017  . Chronic diastolic heart failure ([place]) 11/29/2016  . Dyspnea on exertion 10/06/2016  . Cough variant asthma  vs UACS 10/06/2016  . Encounter for Medicare annual wellness exam 05/22/2016  .  Hyperglycemia 08/07/2015  . Allergic 08/07/2015  .  Gout 08/07/2015  . Leukocytosis 08/07/2015  . Lower abdominal pain 04/21/2015  . Back pain 11/02/2014  . Morbid obesity due to excess calories ([place]) 10/28/2014  . Esophageal reflux 10/28/2014  . Essential hypertension, benign 10/28/2014  . Mixed hyperlipidemia 10/28/2014  . Benign essential tremor 10/28/2014  . Bilateral dry eyes   . Asthma   . Arthritis   . Cancer ([place])   . History of chicken pox     Shirline Frees, SPT 08/09/2017, 10:30 AM  [facility] [address]  [place], [place], [address] Phone: [phone]   Fax:  [phone]  Name: Amanda Copeland MRN: [id number] Date of Birth: 05-[name]-1941

## 2017-08-15 ENCOUNTER — Ambulatory Visit: Payer: Medicare Other | Admitting: Physical Therapy

## 2017-08-15 ENCOUNTER — Encounter: Payer: Self-pay | Admitting: Physical Therapy

## 2017-08-15 DIAGNOSIS — R2681 Unsteadiness on feet: Secondary | ICD-10-CM | POA: Diagnosis not present

## 2017-08-15 DIAGNOSIS — R262 Difficulty in walking, not elsewhere classified: Secondary | ICD-10-CM | POA: Diagnosis not present

## 2017-08-15 DIAGNOSIS — M6281 Muscle weakness (generalized): Secondary | ICD-10-CM | POA: Diagnosis not present

## 2017-08-15 NOTE — Therapy (Signed)
[facility] [address]  [place], [place], [address] Phone: [phone]   Fax:  [phone]  Physical Therapy Treatment  Patient Details  Name: Amanda Copeland MRN: [id number] Date of Birth: 1940/05/08 Referring Provider: Alonza Bogus, MD   Encounter Date: 08/15/2017  PT End of Session - 08/15/17 1031    Visit Number  5    Number of Visits  16    Date for PT Re-Evaluation  09/25/17    Authorization Type  Medicare & BCBS    PT Start Time  0931    PT Stop Time  1011    PT Time Calculation (min)  40 min    Activity Tolerance  Patient tolerated treatment well;Patient limited by fatigue;Other (comment) Pt limted by dizziness after changing positions    Behavior During Therapy  [facility] for tasks assessed/performed       Past Medical History:  Diagnosis Date  . Arthritis   . Asthma    childhood now returning  . Back pain affecting pregnancy 11/02/2014  . Benign essential tremor 10/28/2014  . Bilateral dry eyes   . Breast cancer ([place])    right  . Cancer (HCC)    Breast  . Dry eyes   . Encounter for Medicare annual wellness exam 05/22/2016  . Essential hypertension, benign 10/28/2014  . H/O measles   . H/O mumps   . History of chicken pox   . Lumbago 11/02/2014  . Mixed hyperlipidemia 10/28/2014  . Obesity 10/28/2014    Past Surgical History:  Procedure Laterality Date  . BACK SURGERY    . BREAST REDUCTION SURGERY Left   . CHOLECYSTECTOMY    . EYE SURGERY  2008   b/l cataracts removed, in [place]  . MASTECTOMY Right   . REDUCTION MAMMAPLASTY    . REPLACEMENT TOTAL KNEE BILATERAL Bilateral   . ROTATOR CUFF REPAIR Left     There were no vitals filed for this visit.  Subjective Assessment - 08/15/17 0933    Subjective  Pt reports that she has been doing her exercises over the weekend. Reports some issues with tandem stance exercise and that she recruited her husband to help her with those.     Patient Stated  Goals  "get moving - build up some strength and feel steadier on my feet"    Currently in Pain?  No/denies                       [facility] Adult PT Treatment/Exercise - 08/15/17 0937      Lumbar Exercises: Seated   Other Seated Lumbar Exercises  P[place]of Press sitting in chair with no arm rests on airex pad; 20 reps A/P and Up/Down with Red TB      Knee/Hip Exercises: Aerobic   Nustep  L4 x 6 min (B UE/LE)      Knee/Hip Exercises: Standing   Lateral Step Up  5 reps;Both;Step Height: 4";Limitations    Lateral Step Up Limitations  2 UE assist on treadmill railing    Forward Step Up  Hand Hold: 2;10 reps;Both;Step Height: 4"    Forward Step Up Limitations  Pt demonstrates considerable trunk flexion during movement      Knee/Hip Exercises: Seated   Marching  20 reps;Both;Weights    Marching Weights  3 lbs.          Balance Exercises - 08/15/17 1015      Balance Exercises: Standing  Tandem Stance  Eyes open;Upper extremity support 1;2 reps;10 secs;25 secs    Balance Beam  4 x length of balance beam; walking sideways the length of the balance beam in both directions. 2 UE assist on counter.      Balance Exercises: Seated   Static Sitting  Foam/compliant surface;Eyes opened;Other (comment)      Balance Exercises: Standing   Tandem Stance Limitations  Pt able to remove UE assist for 1 sec at a time; 1 rep 10 sec, 2nd rep 25 sec before LOB and stepping strategy to recover      Balance Exercises: Seated   Static Sitting Limitations  Reaching to limit of stability for bean bag; tossing 10 ft; 15 reps           PT Short Term Goals - 07/19/17 0942      PT SHORT TERM GOAL #1   Title  Independent with initial HEP ([place] program at supported level as inidicated)    Status  On-going      PT SHORT TERM GOAL #2   Title  Increase overall LE strength by 1/2 - 1 grade for improved stability    Status  On-going      PT SHORT TERM GOAL #3   Title  Increase Berg to 38/56  to reduce fall risk    Status  On-going        PT Long Term Goals - 07/19/17 0943      PT LONG TERM GOAL #1   Title  Independent with advanced/ongoing HEP    Status  On-going      PT LONG TERM GOAL #2   Title  Increase overall LE strength to >/= 4-/5 to 4/5 for improved stability and activity tolerance    Status  On-going      PT LONG TERM GOAL #3   Title  Increase Berg to 46/56 to reduce risk for falls    Status  On-going      PT LONG TERM GOAL #4   Title  Decrease TUG to </=13.5 sec to increase safety with transitions    Status  On-going      PT LONG TERM GOAL #5   Title  Increase FGA to >/= 19/30 to decrease risk for falls with community ambulation    Status  On-going            Plan - 08/15/17 1033    Clinical Impression Statement  Pt did well with activities today focusing on strength and ability to maintain balance. Pt limited by fatigue and perception of abilities. Pt continues to demonstrate significant glute and core weakness, which contribute to her difficulty with functional activities and ability to maintain balance when perturbed. Pt will continue to benefit from PT to continue to progress toward functional goals.     Rehab Potential  Good    PT Treatment/Interventions  Patient/family education;ADLs/Self Care Home Management;Neuromuscular re-education;Therapeutic exercise;Therapeutic activities;Functional mobility training;Gait training;Stair training;Manual techniques;Vestibular    Consulted and Agree with Plan of Care  Patient       Patient will benefit from skilled therapeutic intervention in order to improve the following deficits and impairments:  Decreased balance, Decreased strength, Decreased coordination, Decreased activity tolerance, Difficulty walking, Abnormal gait, Decreased mobility, Decreased endurance  Visit Diagnosis: Unsteadiness on feet  Difficulty in walking, not elsewhere classified  Muscle weakness (generalized)     Problem  List Patient Active Problem List   Diagnosis Date Noted  . Fatigue 07/12/2017  . Recurrent falls  03/05/2017  . Chronic diastolic heart failure ([place]) 11/29/2016  . Dyspnea on exertion 10/06/2016  . Cough variant asthma  vs UACS 10/06/2016  . Encounter for Medicare annual wellness exam 05/22/2016  . Hyperglycemia 08/07/2015  . Allergic 08/07/2015  . Gout 08/07/2015  . Leukocytosis 08/07/2015  . Lower abdominal pain 04/21/2015  . Back pain 11/02/2014  . Morbid obesity due to excess calories ([place]) 10/28/2014  . Esophageal reflux 10/28/2014  . Essential hypertension, benign 10/28/2014  . Mixed hyperlipidemia 10/28/2014  . Benign essential tremor 10/28/2014  . Bilateral dry eyes   . Asthma   . Arthritis   . Cancer ([place])   . History of chicken pox     Shirline Frees, SPT 08/15/2017, 3:19 PM  [facility] [address]  [place], [place], [address] Phone: [phone]   Fax:  [phone]  Name: Amanda Copeland MRN: [id number] Date of Birth: 29-May-1940

## 2017-08-17 ENCOUNTER — Ambulatory Visit (INDEPENDENT_AMBULATORY_CARE_PROVIDER_SITE_OTHER): Payer: Medicare Other | Admitting: Family Medicine

## 2017-08-17 ENCOUNTER — Encounter: Payer: Self-pay | Admitting: Physical Therapy

## 2017-08-17 ENCOUNTER — Encounter: Payer: Self-pay | Admitting: Family Medicine

## 2017-08-17 ENCOUNTER — Ambulatory Visit: Payer: Medicare Other | Admitting: Physical Therapy

## 2017-08-17 DIAGNOSIS — M6281 Muscle weakness (generalized): Secondary | ICD-10-CM | POA: Diagnosis not present

## 2017-08-17 DIAGNOSIS — I10 Essential (primary) hypertension: Secondary | ICD-10-CM | POA: Diagnosis not present

## 2017-08-17 DIAGNOSIS — R2681 Unsteadiness on feet: Secondary | ICD-10-CM

## 2017-08-17 DIAGNOSIS — R739 Hyperglycemia, unspecified: Secondary | ICD-10-CM

## 2017-08-17 DIAGNOSIS — E782 Mixed hyperlipidemia: Secondary | ICD-10-CM

## 2017-08-17 DIAGNOSIS — K219 Gastro-esophageal reflux disease without esophagitis: Secondary | ICD-10-CM

## 2017-08-17 DIAGNOSIS — R262 Difficulty in walking, not elsewhere classified: Secondary | ICD-10-CM | POA: Diagnosis not present

## 2017-08-17 MED ORDER — PREVACID 30 MG PO CPDR: 30.0000 mg | DELAYED_RELEASE_CAPSULE | Freq: Two times a day (BID) | ORAL | 3 refills | Status: DC

## 2017-08-17 NOTE — Therapy (Addendum)
[facility] [address]  [place], [place], [address] Phone: [phone]   Fax:  [phone]  Physical Therapy Treatment  Patient Details  Name: Amanda Copeland MRN: [id number] Date of Birth: Feb 09, 1940 Referring Provider: Alonza Bogus, MD   Encounter Date: 08/17/2017  PT End of Session - 08/17/17 1026    Visit Number  6    Number of Visits  16    Date for PT Re-Evaluation  09/25/17    Authorization Type  Medicare & BCBS    PT Start Time  3016    PT Stop Time  1056    PT Time Calculation (min)  38 min    Equipment Utilized During Treatment  Gait belt    Activity Tolerance  Patient tolerated treatment well;Other (comment)    Behavior During Therapy  WFL for tasks assessed/performed       Past Medical History:  Diagnosis Date  . Arthritis   . Asthma    childhood now returning  . Back pain affecting pregnancy 11/02/2014  . Benign essential tremor 10/28/2014  . Bilateral dry eyes   . Breast cancer ([place])    right  . Cancer (HCC)    Breast  . Dry eyes   . Encounter for Medicare annual wellness exam 05/22/2016  . Essential hypertension, benign 10/28/2014  . H/O measles   . H/O mumps   . History of chicken pox   . Lumbago 11/02/2014  . Mixed hyperlipidemia 10/28/2014  . Obesity 10/28/2014    Past Surgical History:  Procedure Laterality Date  . BACK SURGERY    . BREAST REDUCTION SURGERY Left   . CHOLECYSTECTOMY    . EYE SURGERY  2008   b/l cataracts removed, in [place]  . MASTECTOMY Right   . REDUCTION MAMMAPLASTY    . REPLACEMENT TOTAL KNEE BILATERAL Bilateral   . ROTATOR CUFF REPAIR Left     There were no vitals filed for this visit.  Subjective Assessment - 08/17/17 1024    Subjective  Pt reports that she is doing well today and that she was better able to get out of the pool with less help from her husband. Has been performany many of her exercises in the pool, including tandem stance.    Patient  Stated Goals  "get moving - build up some strength and feel steadier on my feet"    Currently in Pain?  No/denies                       [facility] Adult PT Treatment/Exercise - 08/17/17 0001      Lumbar Exercises: Supine   Dead Bug  20 reps    Dead Bug Limitations  2 x 10 reps; VC and TC for abdominal contraction      Knee/Hip Exercises: Aerobic   Nustep  L4 x 6 min (B UE/LE)      Knee/Hip Exercises: Standing   Other Standing Knee Exercises  Toe Raises; 10 reps bilaterally. Pt achieved more ROM on R LE than L LE      Knee/Hip Exercises: Seated   Other Seated Knee/Hip Exercises  Fitter Leg Press; 20 reps each LE; 2 blue bands      Knee/Hip Exercises: Supine   Bridges  10 reps;Both;Strengthening    Bridges Limitations  Pt required VC to go to end ROM; was able to achieve greater ROM today    Bridges with Clamshell  --  Other Supine Knee/Hip Exercises  Bridging with [place]low TB around knees for activtaion of abductor musculature; 2 sets x 5 reps          Balance Exercises - 08/17/17 1050      Balance Exercises: Standing   Standing Eyes Opened  Foam/compliant surface;5 reps;Time      Balance Exercises: Standing   Standing Eyes Opened Limitations  Performed sit to stands with AirEx pad underneath feet, when standing pt was maintained balance on pad for > 2 min. After last rep of STS pt raised each arm 5x to as additional balance challenge.          PT Short Term Goals - 07/19/17 2130      PT SHORT TERM GOAL #1   Title  Independent with initial HEP ([place] program at supported level as inidicated)    Status  On-going      PT SHORT TERM GOAL #2   Title  Increase overall LE strength by 1/2 - 1 grade for improved stability    Status  On-going      PT SHORT TERM GOAL #3   Title  Increase Berg to 38/56 to reduce fall risk    Status  On-going        PT Long Term Goals - 07/19/17 0943      PT LONG TERM GOAL #1   Title  Independent with advanced/ongoing HEP     Status  On-going      PT LONG TERM GOAL #2   Title  Increase overall LE strength to >/= 4-/5 to 4/5 for improved stability and activity tolerance    Status  On-going      PT LONG TERM GOAL #3   Title  Increase Berg to 46/56 to reduce risk for falls    Status  On-going      PT LONG TERM GOAL #4   Title  Decrease TUG to </=13.5 sec to increase safety with transitions    Status  On-going      PT LONG TERM GOAL #5   Title  Increase FGA to >/= 19/30 to decrease risk for falls with community ambulation    Status  On-going            Plan - 08/17/17 1225    Clinical Impression Statement  Amanda Copeland's physical function continues to progress and she states that she is now more capable of performing activities independently including stepping out of the pool walking forward up the steps instead of to the side, and her activity tolerance has increased and she no longer needs a break when watering her flowers. Pt continues to make strength gains and is able to perform more repetitions with less rest time. Her balance confidence improves with every session, although she still demonstrates deficits with maintaining balance on uneven surface. She will continue to benefit from physical therapy to address these deficits and improve her functional ability. Session ended earlier today due to pt needed to leave to be on time at next appointment.     PT Treatment/Interventions  Patient/family education;ADLs/Self Care Home Management;Neuromuscular re-education;Therapeutic exercise;Therapeutic activities;Functional mobility training;Gait training;Stair training;Manual techniques;Vestibular    Consulted and Agree with Plan of Care  Patient       Patient will benefit from skilled therapeutic intervention in order to improve the following deficits and impairments:  Decreased balance, Decreased strength, Decreased coordination, Decreased activity tolerance, Difficulty walking, Abnormal gait, Decreased mobility,  Decreased endurance  Visit Diagnosis: Unsteadiness on feet  Difficulty  in walking, not elsewhere classified  Muscle weakness (generalized)     Problem List Patient Active Problem List   Diagnosis Date Noted  . Fatigue 07/12/2017  . Recurrent falls 03/05/2017  . Chronic diastolic heart failure ([place]) 11/29/2016  . Dyspnea on exertion 10/06/2016  . Cough variant asthma  vs UACS 10/06/2016  . Encounter for Medicare annual wellness exam 05/22/2016  . Hyperglycemia 08/07/2015  . Allergic 08/07/2015  . Gout 08/07/2015  . Leukocytosis 08/07/2015  . Lower abdominal pain 04/21/2015  . Back pain 11/02/2014  . Morbid obesity due to excess calories ([place]) 10/28/2014  . Esophageal reflux 10/28/2014  . Essential hypertension, benign 10/28/2014  . Mixed hyperlipidemia 10/28/2014  . Benign essential tremor 10/28/2014  . Bilateral dry eyes   . Asthma   . Arthritis   . Cancer ([place])   . History of chicken pox     Shirline Frees, SPT 08/17/2017, 5:55 PM  [facility] [address]  [place], [place], [address] Phone: [phone]   Fax:  [phone]  Name: Amanda Copeland MRN: [id number] Date of Birth: October 17, 1940

## 2017-08-17 NOTE — Patient Instructions (Addendum)
Dispense as Written (DAW)    DASH Eating Plan DASH stands for "Dietary Approaches to Stop Hypertension." The DASH eating plan is a healthy eating plan that has been shown to reduce high blood pressure (hypertension). It may also reduce your risk for type 2 diabetes, heart disease, and stroke. The DASH eating plan may also help with weight loss. What are tips for following this plan? General guidelines  Avoid eating more than 2,300 mg (milligrams) of salt (sodium) a day. If you have hypertension, you may need to reduce your sodium intake to 1,500 mg a day.  Limit alcohol intake to no more than 1 drink a day for nonpregnant women and 2 drinks a day for men. One drink equals 12 oz of beer, 5 oz of wine, or 1 oz of hard liquor.  Work with your health care provider to maintain a healthy body weight or to lose weight. Ask what an ideal weight is for you.  Get at least 30 minutes of exercise that causes your heart to beat faster (aerobic exercise) most days of the week. Activities may include walking, swimming, or biking.  Work with your health care provider or diet and nutrition specialist (dietitian) to adjust your eating plan to your individual calorie needs. Reading food labels  Check food labels for the amount of sodium per serving. Choose foods with less than 5 percent of the Daily Value of sodium. Generally, foods with less than 300 mg of sodium per serving fit into this eating plan.  To find whole grains, look for the word "whole" as the first word in the ingredient list. Shopping  Buy products labeled as "low-sodium" or "no salt added."  Buy fresh foods. Avoid canned foods and premade or frozen meals. Cooking  Avoid adding salt when cooking. Use salt-free seasonings or herbs instead of table salt or sea salt. Check with your health care provider or pharmacist before using salt substitutes.  Do not fry foods. Cook foods using healthy methods such as baking, boiling, grilling, and  broiling instead.  Cook with heart-healthy oils, such as olive, canola, soybean, or sunflower oil. Meal planning   Eat a balanced diet that includes: ? 5 or more servings of fruits and vegetables each day. At each meal, try to fill half of your plate with fruits and vegetables. ? Up to 6-8 servings of whole grains each day. ? Less than 6 oz of lean meat, poultry, or fish each day. A 3-oz serving of meat is about the same size as a deck of cards. One egg equals 1 oz. ? 2 servings of low-fat dairy each day. ? A serving of nuts, seeds, or beans 5 times each week. ? Heart-healthy fats. Healthy fats called Omega-3 fatty acids are found in foods such as flaxseeds and coldwater fish, like sardines, salmon, and mackerel.  Limit how much you eat of the following: ? Canned or prepackaged foods. ? Food that is high in trans fat, such as fried foods. ? Food that is high in saturated fat, such as fatty meat. ? Sweets, desserts, sugary drinks, and other foods with added sugar. ? Full-fat dairy products.  Do not salt foods before eating.  Try to eat at least 2 vegetarian meals each week.  Eat more home-cooked food and less restaurant, buffet, and fast food.  When eating at a restaurant, ask that your food be prepared with less salt or no salt, if possible. What foods are recommended? The items listed may not be a complete  list. Talk with your dietitian about what dietary choices are best for you. Grains Whole-grain or whole-wheat bread. Whole-grain or whole-wheat pasta. Brown rice. [place]. Bulgur. Whole-grain and low-sodium cereals. Pita bread. Low-fat, low-sodium crackers. Whole-wheat flour tortillas. Vegetables Fresh or frozen vegetables (raw, steamed, roasted, or grilled). Low-sodium or reduced-sodium tomato and vegetable juice. Low-sodium or reduced-sodium tomato sauce and tomato paste. Low-sodium or reduced-sodium canned vegetables. Fruits All fresh, dried, or frozen fruit. Canned  fruit in natural juice (without added sugar). Meat and other protein foods Skinless chicken or [place]. Ground chicken or [place]. Pork with fat trimmed off. Fish and seafood. Egg whites. Dried beans, peas, or lentils. Unsalted nuts, nut butters, and seeds. Unsalted canned beans. Lean cuts of beef with fat trimmed off. Low-sodium, lean deli meat. Dairy Low-fat (1%) or fat-free (skim) milk. Fat-free, low-fat, or reduced-fat cheeses. Nonfat, low-sodium ricotta or cottage cheese. Low-fat or nonfat yogurt. Low-fat, low-sodium cheese. Fats and oils Soft margarine without trans fats. Vegetable oil. Low-fat, reduced-fat, or light mayonnaise and salad dressings (reduced-sodium). Canola, safflower, olive, soybean, and sunflower oils. Avocado. Seasoning and other foods Herbs. Spices. Seasoning mixes without salt. Unsalted popcorn and pretzels. Fat-free sweets. What foods are not recommended? The items listed may not be a complete list. Talk with your dietitian about what dietary choices are best for you. Grains Baked goods made with fat, such as croissants, muffins, or some breads. Dry pasta or rice meal packs. Vegetables Creamed or fried vegetables. Vegetables in a cheese sauce. Regular canned vegetables (not low-sodium or reduced-sodium). Regular canned tomato sauce and paste (not low-sodium or reduced-sodium). Regular tomato and vegetable juice (not low-sodium or reduced-sodium). Angie Fava. Olives. Fruits Canned fruit in a light or heavy syrup. Fried fruit. Fruit in cream or butter sauce. Meat and other protein foods Fatty cuts of meat. Ribs. Fried meat. Berniece Salines. Sausage. Bologna and other processed lunch meats. Salami. Fatback. Hotdogs. Bratwurst. Salted nuts and seeds. Canned beans with added salt. Canned or smoked fish. Whole eggs or egg yolks. Chicken or [place] with skin. Dairy Whole or 2% milk, cream, and half-and-half. Whole or full-fat cream cheese. Whole-fat or sweetened yogurt. Full-fat cheese.  Nondairy creamers. Whipped toppings. Processed cheese and cheese spreads. Fats and oils Butter. Stick margarine. Lard. Shortening. Ghee. Bacon fat. Tropical oils, such as coconut, palm kernel, or palm oil. Seasoning and other foods Salted popcorn and pretzels. Onion salt, garlic salt, seasoned salt, table salt, and sea salt. Worcestershire sauce. Tartar sauce. Barbecue sauce. Teriyaki sauce. Soy sauce, including reduced-sodium. Steak sauce. Canned and packaged gravies. Fish sauce. Oyster sauce. Cocktail sauce. Horseradish that you find on the shelf. Ketchup. Mustard. Meat flavorings and tenderizers. Bouillon cubes. Hot sauce and Tabasco sauce. Premade or packaged marinades. Premade or packaged taco seasonings. Relishes. Regular salad dressings. Where to find more information:  National Heart, Lung, and [place]: https://wilson-eaton.com/  American Heart Association: www.heart.org Summary  The DASH eating plan is a healthy eating plan that has been shown to reduce high blood pressure (hypertension). It may also reduce your risk for type 2 diabetes, heart disease, and stroke.  With the DASH eating plan, you should limit salt (sodium) intake to 2,300 mg a day. If you have hypertension, you may need to reduce your sodium intake to 1,500 mg a day.  When on the DASH eating plan, aim to eat more fresh fruits and vegetables, whole grains, lean proteins, low-fat dairy, and heart-healthy fats.  Work with your health care provider or diet and nutrition specialist (dietitian) to  adjust your eating plan to your individual calorie needs. This information is not intended to replace advice given to you by your health care provider. Make sure you discuss any questions you have with your health care provider. Document Released: 01/13/2011 Document Revised: 01/18/2016 Document Reviewed: 01/18/2016 Elsevier Interactive Patient Education  [employer].

## 2017-08-21 ENCOUNTER — Encounter: Payer: Self-pay | Admitting: Physical Therapy

## 2017-08-21 ENCOUNTER — Other Ambulatory Visit: Payer: Self-pay | Admitting: Family Medicine

## 2017-08-21 ENCOUNTER — Ambulatory Visit: Payer: Medicare Other | Admitting: Physical Therapy

## 2017-08-21 DIAGNOSIS — M6281 Muscle weakness (generalized): Secondary | ICD-10-CM

## 2017-08-21 DIAGNOSIS — R2681 Unsteadiness on feet: Secondary | ICD-10-CM

## 2017-08-21 DIAGNOSIS — R262 Difficulty in walking, not elsewhere classified: Secondary | ICD-10-CM | POA: Diagnosis not present

## 2017-08-21 NOTE — Therapy (Signed)
[facility] [address]  [place], [place], [address] Phone: [phone]   Fax:  [phone]  Physical Therapy Treatment  Patient Details  Name: Amanda Copeland MRN: [id number] Date of Birth: Nov 30, 1940 Referring Provider: Alonza Bogus, MD   Encounter Date: 08/21/2017  PT End of Session - 08/21/17 0930    Visit Number  7    Number of Visits  16    Date for PT Re-Evaluation  09/25/17    Authorization Type  Medicare & BCBS    PT Start Time  0930    PT Stop Time  1015    PT Time Calculation (min)  45 min    Equipment Utilized During Treatment  --    Activity Tolerance  Patient tolerated treatment well    Behavior During Therapy  [facility] for tasks assessed/performed       Past Medical History:  Diagnosis Date  . Arthritis   . Asthma    childhood now returning  . Back pain affecting pregnancy 11/02/2014  . Benign essential tremor 10/28/2014  . Bilateral dry eyes   . Breast cancer ([place])    right  . Cancer (HCC)    Breast  . Dry eyes   . Encounter for Medicare annual wellness exam 05/22/2016  . Essential hypertension, benign 10/28/2014  . H/O measles   . H/O mumps   . History of chicken pox   . Lumbago 11/02/2014  . Mixed hyperlipidemia 10/28/2014  . Obesity 10/28/2014    Past Surgical History:  Procedure Laterality Date  . BACK SURGERY    . BREAST REDUCTION SURGERY Left   . CHOLECYSTECTOMY    . EYE SURGERY  2008   b/l cataracts removed, in [place]  . MASTECTOMY Right   . REDUCTION MAMMAPLASTY    . REPLACEMENT TOTAL KNEE BILATERAL Bilateral   . ROTATOR CUFF REPAIR Left     There were no vitals filed for this visit.  Subjective Assessment - 08/21/17 0930    Subjective  Pt w/o complaints or concerns today.    Patient Stated Goals  "get moving - build up some strength and feel steadier on my feet"    Currently in Pain?  No/denies         [facility] PT Assessment - 08/21/17 0930      Assessment   Medical  Diagnosis  Gait instability d/t benign essential tremor    Referring Provider  Alonza Bogus, MD    Next MD Visit  Aug 2019      Strength   Right Hip Flexion  3+/5    Right Hip Extension  3+/5    Right Hip External Rotation   3+/5    Right Hip Internal Rotation  4-/5    Right Hip ABduction  3+/5    Right Hip ADduction  3/5    Left Hip Flexion  3+/5    Left Hip Extension  3+/5    Left Hip External Rotation  3+/5    Left Hip Internal Rotation  4-/5    Left Hip ABduction  3+/5    Left Hip ADduction  3/5    Right Knee Flexion  4/5    Right Knee Extension  4-/5    Left Knee Flexion  4/5    Left Knee Extension  4-/5    Right Ankle Dorsiflexion  4-/5    Right Ankle Plantar Flexion  4-/5    Left Ankle Dorsiflexion  4-/5  Left Ankle Plantar Flexion  3-/5      Ambulation/Gait   Gait velocity  2.25 ft/sec      Standardized Balance Assessment   Five times sit to stand comments   22.56    10 Meter Walk  14.59"      Berg Balance Test   Sit to Stand  Able to stand without using hands and stabilize independently    Standing Unsupported  Able to stand safely 2 minutes    Sitting with Back Unsupported but Feet Supported on Floor or Stool  Able to sit safely and securely 2 minutes    Stand to Sit  Sits safely with minimal use of hands    Transfers  Able to transfer safely, minor use of hands    Standing Unsupported with Eyes Closed  Able to stand 10 seconds with supervision    Standing Ubsupported with Feet Together  Able to place feet together independently and stand for 1 minute with supervision narrow BOS but unable to get feet together d/t leg girth    From Standing, Reach Forward with Outstretched Arm  Can reach forward >12 cm safely (5")    From Standing Position, Pick up Object from Floor  Unable to pick up shoe, but reaches 2-5 cm (1-2") from shoe and balances independently    From Standing Position, Turn to Look Behind Over each Shoulder  Looks behind one side only/other side shows  less weight shift    Turn 360 Degrees  Able to turn 360 degrees safely but slowly    Standing Unsupported, Alternately Place Feet on Step/Stool  Able to complete >2 steps/needs minimal assist    Standing Unsupported, One Foot in Front  Able to take small step independently and hold 30 seconds    Standing on One Leg  Tries to lift leg/unable to hold 3 seconds but remains standing independently    Total Score  40    Berg comment:  37-45 = Significant (>80%) fall risk       Timed Up and Go Test   Normal TUG (seconds)  17.18                   [facility] Adult PT Treatment/Exercise - 08/21/17 0930      Knee/Hip Exercises: Aerobic   Nustep  L5 x 6 min (B LE only)      Knee/Hip Exercises: Seated   Clamshell with TheraBand  Red Alt hip ABD/ER with TB just above knees x15    Other Seated Knee/Hip Exercises  Toe/Heel Raises x 20 - 4# cuff wt on L knee    Marching  Both;15 reps    Marching Limitations  red TB just above knees               PT Short Term Goals - 08/21/17 0935      PT SHORT TERM GOAL #1   Title  Independent with initial HEP ([place] program at supported level as inidicated)    Status  Achieved      PT SHORT TERM GOAL #2   Title  Increase overall LE strength by 1/2 - 1 grade for improved stability    Status  Achieved      PT SHORT TERM GOAL #3   Title  Increase Berg to 38/56 to reduce fall risk    Status  Achieved        PT Long Term Goals - 07/19/17 0943      PT LONG TERM GOAL #  1   Title  Independent with advanced/ongoing HEP    Status  On-going      PT LONG TERM GOAL #2   Title  Increase overall LE strength to >/= 4-/5 to 4/5 for improved stability and activity tolerance    Status  On-going      PT LONG TERM GOAL #3   Title  Increase Berg to 46/56 to reduce risk for falls    Status  On-going      PT LONG TERM GOAL #4   Title  Decrease TUG to </=13.5 sec to increase safety with transitions    Status  On-going      PT LONG TERM GOAL #5    Title  Increase FGA to >/= 19/30 to decrease risk for falls with community ambulation    Status  On-going            Plan - 08/21/17 0936    Clinical Impression Statement  Amanda Copeland demonstrating good initial progress with PT with progress noted in B LE strength as well as on all standardized balance tests (only gait speed essentially unchanged) - all STGs now met - and pt noting improving functional activity tolerance with decreasing need for rest breaks or external assistance. LE weakness remains greatest proximally and in L PF, therefore therapeutic exercises today targeting areas of greatest weakness and will consider adding further proximal LE strengthening HEP exercises to current [place] HEP as indicated over upcoming PT visits.    Rehab Potential  Good    PT Treatment/Interventions  Patient/family education;ADLs/Self Care Home Management;Neuromuscular re-education;Therapeutic exercise;Therapeutic activities;Functional mobility training;Gait training;Stair training;Manual techniques;Vestibular    Consulted and Agree with Plan of Care  Patient       Patient will benefit from skilled therapeutic intervention in order to improve the following deficits and impairments:  Decreased balance, Decreased strength, Decreased coordination, Decreased activity tolerance, Difficulty walking, Abnormal gait, Decreased mobility, Decreased endurance  Visit Diagnosis: Unsteadiness on feet  Difficulty in walking, not elsewhere classified  Muscle weakness (generalized)     Problem List Patient Active Problem List   Diagnosis Date Noted  . Fatigue 07/12/2017  . Recurrent falls 03/05/2017  . Chronic diastolic heart failure ([place]) 11/29/2016  . Dyspnea on exertion 10/06/2016  . Cough variant asthma  vs UACS 10/06/2016  . Encounter for Medicare annual wellness exam 05/22/2016  . Hyperglycemia 08/07/2015  . Allergic 08/07/2015  . Gout 08/07/2015  . Leukocytosis 08/07/2015  . Lower abdominal pain  04/21/2015  . Back pain 11/02/2014  . Morbid obesity due to excess calories ([place]) 10/28/2014  . Esophageal reflux 10/28/2014  . Essential hypertension, benign 10/28/2014  . Mixed hyperlipidemia 10/28/2014  . Benign essential tremor 10/28/2014  . Bilateral dry eyes   . Asthma   . Arthritis   . Cancer ([place])   . History of chicken pox     Percival Spanish, PT, MPT 08/21/2017, 12:08 PM  [facility] [address]  [place], [place], [address] Phone: [phone]   Fax:  [phone]  Name: Amanda Copeland MRN: [id number] Date of Birth: 07-08-1940

## 2017-08-22 ENCOUNTER — Ambulatory Visit: Payer: Medicare Other | Admitting: Physical Therapy

## 2017-08-22 ENCOUNTER — Encounter: Payer: Self-pay | Admitting: Physical Therapy

## 2017-08-22 ENCOUNTER — Encounter: Payer: Self-pay | Admitting: Neurology

## 2017-08-22 DIAGNOSIS — R262 Difficulty in walking, not elsewhere classified: Secondary | ICD-10-CM | POA: Diagnosis not present

## 2017-08-22 DIAGNOSIS — R2681 Unsteadiness on feet: Secondary | ICD-10-CM

## 2017-08-22 DIAGNOSIS — M6281 Muscle weakness (generalized): Secondary | ICD-10-CM | POA: Diagnosis not present

## 2017-08-22 NOTE — Therapy (Signed)
[facility] [address]  [place], [place], [address] Phone: [phone]   Fax:  [phone]  Physical Therapy Treatment  Patient Details  Name: Amanda Copeland MRN: [id number] Date of Birth: [birth year] Referring Provider: Alonza Bogus, MD   Encounter Date: 08/22/2017  PT End of Session - 08/22/17 0933    Visit Number  8    Number of Visits  16    Date for PT Re-Evaluation  09/25/17    Authorization Type  Medicare & BCBS    PT Start Time  0933    PT Stop Time  1016    PT Time Calculation (min)  43 min    Activity Tolerance  Patient tolerated treatment well    Behavior During Therapy  [facility] for tasks assessed/performed       Past Medical History:  Diagnosis Date  . Arthritis   . Asthma    childhood now returning  . Back pain affecting pregnancy 11/02/2014  . Benign essential tremor 10/28/2014  . Bilateral dry eyes   . Breast cancer ([place])    right  . Cancer (HCC)    Breast  . Dry eyes   . Encounter for Medicare annual wellness exam 05/22/2016  . Essential hypertension, benign 10/28/2014  . H/O measles   . H/O mumps   . History of chicken pox   . Lumbago 11/02/2014  . Mixed hyperlipidemia 10/28/2014  . Obesity 10/28/2014    Past Surgical History:  Procedure Laterality Date  . BACK SURGERY    . BREAST REDUCTION SURGERY Left   . CHOLECYSTECTOMY    . EYE SURGERY  2008   b/l cataracts removed, in [place]  . MASTECTOMY Right   . REDUCTION MAMMAPLASTY    . REPLACEMENT TOTAL KNEE BILATERAL Bilateral   . ROTATOR CUFF REPAIR Left     There were no vitals filed for this visit.  Subjective Assessment - 08/22/17 0934    Subjective  Pt preparing to leave for vacation through Sunday.    Patient Stated Goals  "get moving - build up some strength and feel steadier on my feet"    Currently in Pain?  No/denies              [facility] Adult PT Treatment/Exercise - 08/22/17 0933      Lumbar Exercises: Supine   Clam  --    Bridge  --      Knee/Hip Exercises: Standing   Hip Flexion  10 reps;Stengthening;Both    Hip Flexion Limitations  Yellow TB    Hip ADduction  10 reps;Strengthening;Both    Hip ADduction Limitations  Yellow TB    Hip Abduction  10 reps;Both;Stengthening    Abduction Limitations  Yellow TB    Hip Extension  10 reps;Both;Stengthening    Extension Limitations  Yellow TB      Knee/Hip Exercises: Seated   Long Arc Quad  10 reps;Both    Long [employer] Limitations  With isometric adductor squeeze against pilllow    Other Seated Knee/Hip Exercises  B hip IR/ER with yellow TB at foot/ankle x15 each      Knee/Hip Exercises: Supine   Hip Adduction Isometric  10 reps;Both    Hip Adduction Isometric Limitations  Pillow between knees; 5 second hold    Bridges  10 reps    Other Supine Knee/Hip Exercises  Clams alternating LEs; 10 reps each side, Red TB around knees      Ankle Exercises:  Seated   Ankle Circles/Pumps  Left;10 reps    Ankle Circles/Pumps Limitations  Blue TB             PT Education - 08/22/17 1026    Education Details  Pt given additional HEP exercises    Person(s) Educated  Patient    Methods  Explanation;Handout;Demonstration    Comprehension  Verbalized understanding;Returned demonstration       PT Short Term Goals - 08/21/17 0935      PT SHORT TERM GOAL #1   Title  Independent with initial HEP ([place] program at supported level as inidicated)    Status  Achieved      PT SHORT TERM GOAL #2   Title  Increase overall LE strength by 1/2 - 1 grade for improved stability    Status  Achieved      PT SHORT TERM GOAL #3   Title  Increase Berg to 38/56 to reduce fall risk    Status  Achieved        PT Long Term Goals - 07/19/17 0943      PT LONG TERM GOAL #1   Title  Independent with advanced/ongoing HEP    Status  On-going      PT LONG TERM GOAL #2   Title  Increase overall LE strength to >/= 4-/5 to 4/5 for improved stability and activity  tolerance    Status  On-going      PT LONG TERM GOAL #3   Title  Increase Berg to 46/56 to reduce risk for falls    Status  On-going      PT LONG TERM GOAL #4   Title  Decrease TUG to </=13.5 sec to increase safety with transitions    Status  On-going      PT LONG TERM GOAL #5   Title  Increase FGA to >/= 19/30 to decrease risk for falls with community ambulation    Status  On-going            Plan - 08/22/17 1017    Clinical Impression Statement  Today's session focused on continued strengthening of the LEs. Pt demonstrates difficulty with PF and stability/fatigue of R LE in SLS when performing activities with other LEs, such as with 4 way hip strengthening exercises. Pt will continue to benefit from therapy to further progress her functional capacity and reach her functional goals.     Rehab Potential  Good    PT Treatment/Interventions  Patient/family education;ADLs/Self Care Home Management;Neuromuscular re-education;Therapeutic exercise;Therapeutic activities;Functional mobility training;Gait training;Stair training;Manual techniques;Vestibular    Consulted and Agree with Plan of Care  Patient       Patient will benefit from skilled therapeutic intervention in order to improve the following deficits and impairments:  Decreased balance, Decreased strength, Decreased coordination, Decreased activity tolerance, Difficulty walking, Abnormal gait, Decreased mobility, Decreased endurance  Visit Diagnosis: Unsteadiness on feet  Difficulty in walking, not elsewhere classified  Muscle weakness (generalized)     Problem List Patient Active Problem List   Diagnosis Date Noted  . Fatigue 07/12/2017  . Recurrent falls 03/05/2017  . Chronic diastolic heart failure ([place]) 11/29/2016  . Dyspnea on exertion 10/06/2016  . Cough variant asthma  vs UACS 10/06/2016  . Encounter for Medicare annual wellness exam 05/22/2016  . Hyperglycemia 08/07/2015  . Allergic 08/07/2015  . Gout  08/07/2015  . Leukocytosis 08/07/2015  . Lower abdominal pain 04/21/2015  . Back pain 11/02/2014  . Morbid obesity due to excess calories ([place])  10/28/2014  . Esophageal reflux 10/28/2014  . Essential hypertension, benign 10/28/2014  . Mixed hyperlipidemia 10/28/2014  . Benign essential tremor 10/28/2014  . Bilateral dry eyes   . Asthma   . Arthritis   . Cancer ([place])   . History of chicken pox     Shirline Frees, SPT 08/22/2017, 12:30 PM  [facility] [address]  [place], [place], [address] Phone: [phone]   Fax:  [phone]  Name: Katherina Wimer MRN: [id number] Date of Birth: 1940/06/26

## 2017-08-23 ENCOUNTER — Encounter: Payer: Medicare Other | Admitting: Physical Therapy

## 2017-08-23 DIAGNOSIS — H35363 Drusen (degenerative) of macula, bilateral: Secondary | ICD-10-CM | POA: Diagnosis not present

## 2017-08-23 DIAGNOSIS — H353112 Nonexudative age-related macular degeneration, right eye, intermediate dry stage: Secondary | ICD-10-CM | POA: Diagnosis not present

## 2017-08-23 DIAGNOSIS — H43811 Vitreous degeneration, right eye: Secondary | ICD-10-CM | POA: Diagnosis not present

## 2017-08-23 DIAGNOSIS — H35452 Secondary pigmentary degeneration, left eye: Secondary | ICD-10-CM | POA: Diagnosis not present

## 2017-08-23 DIAGNOSIS — H353121 Nonexudative age-related macular degeneration, left eye, early dry stage: Secondary | ICD-10-CM | POA: Diagnosis not present

## 2017-08-23 DIAGNOSIS — H35451 Secondary pigmentary degeneration, right eye: Secondary | ICD-10-CM | POA: Diagnosis not present

## 2017-08-23 DIAGNOSIS — Z961 Presence of intraocular lens: Secondary | ICD-10-CM | POA: Diagnosis not present

## 2017-08-23 NOTE — Assessment & Plan Note (Signed)
Well controlled on Prevacid but only tolerates the brand name so her prescription is written DAW. Avoid offending foods, start probiotics. Do not eat large meals in late evening and consider raising head of bed.

## 2017-08-23 NOTE — Progress Notes (Signed)
Patient ID: Amanda Copeland, female   DOB: 11-16-1940, 77 y.o.   MRN: [id number]   Subjective:    Patient ID: Amanda Copeland, female    DOB: [birth year], 77 y.o.   MRN: [id number]  Chief Complaint  Patient presents with  . Hypertension    follow up appointment    HPI Patient is in today for follow up and she feels well today. No recent febrile illness or hospitalizations. No polyuria or polydipsia. She is requesting a refill on her Prevacid but notes she did not tolerate the generic so she needs the brand name to be prescribed. Denies CP/palp/SOB/HA/congestion/fevers/GI or GU c/o. Taking meds as prescribed  Past Medical History:  Diagnosis Date  . Arthritis   . Asthma    childhood now returning  . Back pain affecting pregnancy 11/02/2014  . Benign essential tremor 10/28/2014  . Bilateral dry eyes   . Breast cancer ([place])    right  . Cancer (HCC)    Breast  . Dry eyes   . Encounter for Medicare annual wellness exam 05/22/2016  . Essential hypertension, benign 10/28/2014  . H/O measles   . H/O mumps   . History of chicken pox   . Lumbago 11/02/2014  . Mixed hyperlipidemia 10/28/2014  . Obesity 10/28/2014    Past Surgical History:  Procedure Laterality Date  . BACK SURGERY    . BREAST REDUCTION SURGERY Left   . CHOLECYSTECTOMY    . EYE SURGERY  2008   b/l cataracts removed, in [place]  . MASTECTOMY Right   . REDUCTION MAMMAPLASTY    . REPLACEMENT TOTAL KNEE BILATERAL Bilateral   . ROTATOR CUFF REPAIR Left     Family History  Problem Relation Age of Onset  . Heart disease Mother   . Heart attack Mother   . Heart failure Mother   . Hypertension Mother   . Cancer Father        colon  . Asthma Father   . Parkinson's disease Father   . Cancer Maternal Grandmother        uterine  . Heart disease Maternal Grandfather   . Obesity Daughter   . Appendicitis Paternal Grandmother   . Stroke Neg Hx     Social History   Socioeconomic History  . Marital status: Married   Spouse name: Not on file  . Number of children: Not on file  . Years of education: Not on file  . Highest education level: Not on file  Occupational History  . 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 Financial resource strain: Not on file  . Food insecurity:    Worry: Not on file    Inability: Not on file  . Transportation needs:    Medical: Not on file    Non-medical: Not on file  Tobacco Use  . Smoking status: Never Smoker  . Smokeless tobacco: Never Used  Substance and Sexual Activity  . Alcohol use: Yes    Alcohol/week: 0.0 oz    Comment: two times a month  . Drug use: No  . Sexual activity: Not on file    Comment: lives with husband, moved [place], no dietary restrictions, retired Education officer, museum  Lifestyle  . Physical activity:    Days per week: Not on file    Minutes per session: Not on file  . Stress: Not on file  Relationships  . Social connections:    Talks on phone: Not on file  Gets together: Not on file    Attends religious service: Not on file    Active member of club or organization: Not on file    Attends meetings of clubs or organizations: Not on file    Relationship status: Not on file  . Intimate partner violence:    Fear of current or ex partner: Not on file    Emotionally abused: Not on file    Physically abused: Not on file    Forced sexual activity: Not on file  Other Topics Concern  . Not on file  Social History Narrative  . Not on file    Outpatient Medications Prior to Visit  Medication Sig Dispense Refill  . albuterol (PROAIR HFA) 108 (90 Base) MCG/ACT inhaler Inhale 2 puffs into the lungs every 6 (six) hours as needed for wheezing or shortness of breath.    . allopurinol (ZYLOPRIM) 100 MG tablet Take 1 tablet (100 mg total) by mouth daily. 180 tablet 0  . aspirin 81 MG tablet Take 81 mg by mouth daily.    Marland Kitchen diltiazem (TIAZAC) 180 MG 24 hr capsule Take 1 capsule (180 mg total) by mouth daily. 90 capsule 2    . fluticasone (FLONASE) 50 MCG/ACT nasal spray USE 2 SPRAYS IN EACH NOSTRIL AT BEDTIME  11  . KLOR-CON M20 20 MEQ tablet Take 20 mEq by mouth daily.  3  . montelukast (SINGULAIR) 10 MG tablet TAKE 1 TABLET BY MOUTH EVERY DAY NIGHTLY 30 tablet 11  . Multiple Vitamins-Minerals (ICAPS AREDS 2) CAPS Take 1 capsule by mouth 2 (two) times daily.    . Potassium Chloride ER 20 MEQ TBCR Take 20 mEq daily by mouth. 90 tablet 3  . propranolol (INDERAL) 20 MG tablet TAKE 1 TABLET (20 MG TOTAL) BY MOUTH 2 (TWO) TIMES DAILY. 180 tablet 1  . Red Yeast Rice Extract (RED YEAST RICE PO) Take by mouth daily.    Marland Kitchen topiramate (TOPAMAX) 100 MG tablet Take 1 tablet (100 mg total) by mouth daily. 90 tablet 1  . PREVACID 30 MG capsule TAKE ONE CAPSULE BY MOUTH TWICE A DAY 180 capsule 1  . furosemide (LASIX) 40 MG tablet Take 1 tablet (40 mg total) daily by mouth. 90 tablet 3   No facility-administered medications prior to visit.     Allergies  Allergen Reactions  . Amoxicillin Rash  . Ampicillin Rash  . Penicillins Rash  . Statins Other (See Comments)    Per reports muscle aches and joint pain    Review of Systems  Constitutional: Negative for fever and malaise/fatigue.  HENT: Negative for congestion.   Eyes: Negative for blurred vision.  Respiratory: Negative for shortness of breath.   Cardiovascular: Negative for chest pain, palpitations and leg swelling.  Gastrointestinal: Positive for heartburn. Negative for abdominal pain, blood in stool and nausea.  Genitourinary: Negative for dysuria and frequency.  Musculoskeletal: Negative for falls.  Skin: Negative for rash.  Neurological: Negative for dizziness, loss of consciousness and headaches.  Endo/Heme/Allergies: Negative for environmental allergies.  Psychiatric/Behavioral: Negative for depression. The patient is not nervous/anxious.        Objective:    Physical Exam  Constitutional: She is oriented to person, place, and time. She appears  well-developed and well-nourished. No distress.  HENT:  Head: Normocephalic and atraumatic.  Nose: Nose normal.  Eyes: Right eye exhibits no discharge. Left eye exhibits no discharge.  Neck: Normal range of motion. Neck supple.  Cardiovascular: Normal rate and regular rhythm.  No  murmur heard. Pulmonary/Chest: Effort normal and breath sounds normal.  Abdominal: Soft. Bowel sounds are normal. There is no tenderness.  Musculoskeletal: She exhibits no edema.  Neurological: She is alert and oriented to person, place, and time.  Skin: Skin is warm and dry.  Psychiatric: She has a normal mood and affect.  Nursing note and vitals reviewed.   BP 124/64 (BP Location: Left Arm, Patient Position: Sitting, Cuff Size: Large)   Pulse 63   Resp 16   Ht 5' (1.524 m)   Wt 244 lb (110.7 kg)   SpO2 99%   BMI 47.65 kg/m  Wt Readings from Last 3 Encounters:  08/17/17 244 lb (110.7 kg)  07/11/17 243 lb 12.8 oz (110.6 kg)  07/05/17 246 lb (111.6 kg)     Lab Results  Component Value Date   WBC 8.8 07/11/2017   HGB 13.5 07/11/2017   HCT 40.5 07/11/2017   PLT 350.0 07/11/2017   GLUCOSE 94 07/11/2017   CHOL 205 (H) 07/11/2017   TRIG 122.0 07/11/2017   HDL 57.60 07/11/2017   LDLDIRECT 115.0 10/27/2016   LDLCALC 123 (H) 07/11/2017   ALT 12 07/11/2017   AST 15 07/11/2017   NA 142 07/11/2017   K 4.2 07/11/2017   CL 106 07/11/2017   CREATININE 0.79 07/11/2017   BUN 19 07/11/2017   CO2 25 07/11/2017   TSH 3.04 07/11/2017   HGBA1C 5.9 03/03/2017    Lab Results  Component Value Date   TSH 3.04 07/11/2017   Lab Results  Component Value Date   WBC 8.8 07/11/2017   HGB 13.5 07/11/2017   HCT 40.5 07/11/2017   MCV 96.0 07/11/2017   PLT 350.0 07/11/2017   Lab Results  Component Value Date   NA 142 07/11/2017   K 4.2 07/11/2017   CO2 25 07/11/2017   GLUCOSE 94 07/11/2017   BUN 19 07/11/2017   CREATININE 0.79 07/11/2017   BILITOT 0.3 07/11/2017   ALKPHOS 89 07/11/2017   AST 15  07/11/2017   ALT 12 07/11/2017   PROT 6.3 07/11/2017   ALBUMIN 3.8 07/11/2017   CALCIUM 9.3 07/11/2017   GFR 74.89 07/11/2017   Lab Results  Component Value Date   CHOL 205 (H) 07/11/2017   Lab Results  Component Value Date   HDL 57.60 07/11/2017   Lab Results  Component Value Date   LDLCALC 123 (H) 07/11/2017   Lab Results  Component Value Date   TRIG 122.0 07/11/2017   Lab Results  Component Value Date   CHOLHDL 4 07/11/2017   Lab Results  Component Value Date   HGBA1C 5.9 03/03/2017       Assessment & Plan:   Problem List Items Addressed This Visit    Esophageal reflux    Well controlled on Prevacid but only tolerates the brand name so her prescription is written DAW. Avoid offending foods, start probiotics. Do not eat large meals in late evening and consider raising head of bed.       Relevant Medications   PREVACID 30 MG capsule   Essential hypertension, benign    Well controlled, no changes to meds. Encouraged heart healthy diet such as the DASH diet and exercise as tolerated.       Mixed hyperlipidemia    Encouraged heart healthy diet, increase exercise, avoid trans fats, consider a krill oil cap daily      Hyperglycemia    hgba1c acceptable, minimize simple carbs. Increase exercise as tolerated. Continue current meds  Morbid obesity ([place])    Encouraged DASH diet, decrease po intake and increase exercise as tolerated. Needs 7-8 hours of sleep nightly. Avoid trans fats, eat small, frequent meals every 4-5 hours with lean proteins, complex carbs and healthy fats. Minimize simple carbs         I have changed [place] PREVACID. I am also having her maintain her Red Yeast Rice Extract (RED YEAST RICE PO), aspirin, diltiazem, allopurinol, ICAPS AREDS 2, Potassium Chloride ER, furosemide, albuterol, topiramate, fluticasone, propranolol, montelukast, and KLOR-CON M20.  Meds ordered this encounter  Medications  . PREVACID 30 MG capsule    Sig:  Take 1 capsule (30 mg total) by mouth 2 (two) times daily.    Dispense:  180 capsule    Refill:  3     [name], MD

## 2017-08-23 NOTE — Assessment & Plan Note (Signed)
hgba1c acceptable, minimize simple carbs. Increase exercise as tolerated. Continue current meds 

## 2017-08-23 NOTE — Assessment & Plan Note (Signed)
Encouraged DASH diet, decrease po intake and increase exercise as tolerated. Needs 7-8 hours of sleep nightly. Avoid trans fats, eat small, frequent meals every 4-5 hours with lean proteins, complex carbs and healthy fats. Minimize simple carbs 

## 2017-08-23 NOTE — Assessment & Plan Note (Signed)
Well controlled, no changes to meds. Encouraged heart healthy diet such as the DASH diet and exercise as tolerated.  °

## 2017-08-23 NOTE — Assessment & Plan Note (Signed)
Encouraged heart healthy diet, increase exercise, avoid trans fats, consider a krill oil cap daily 

## 2017-08-28 ENCOUNTER — Ambulatory Visit: Payer: Medicare Other | Admitting: Physical Therapy

## 2017-08-28 ENCOUNTER — Encounter: Payer: Self-pay | Admitting: Physical Therapy

## 2017-08-28 DIAGNOSIS — R262 Difficulty in walking, not elsewhere classified: Secondary | ICD-10-CM | POA: Diagnosis not present

## 2017-08-28 DIAGNOSIS — R2681 Unsteadiness on feet: Secondary | ICD-10-CM

## 2017-08-28 DIAGNOSIS — M6281 Muscle weakness (generalized): Secondary | ICD-10-CM | POA: Diagnosis not present

## 2017-08-28 NOTE — Therapy (Signed)
[facility] [address]  [place], [place], [address] Phone: [phone]   Fax:  [phone]  Physical Therapy Treatment  Patient Details  Name: Amanda Copeland MRN: [id number] Date of Birth: [birth year] Referring Provider: Alonza Bogus, MD   Encounter Date: 08/28/2017  PT End of Session - 08/28/17 0928    Visit Number  9    Number of Visits  16    Date for PT Re-Evaluation  09/25/17    Authorization Type  Medicare & BCBS    PT Start Time  0928    PT Stop Time  1014    PT Time Calculation (min)  46 min    Activity Tolerance  Patient tolerated treatment well    Behavior During Therapy  [facility] for tasks assessed/performed       Past Medical History:  Diagnosis Date  . Arthritis   . Asthma    childhood now returning  . Back pain affecting pregnancy 11/02/2014  . Benign essential tremor 10/28/2014  . Bilateral dry eyes   . Breast cancer ([place])    right  . Cancer (HCC)    Breast  . Dry eyes   . Encounter for Medicare annual wellness exam 05/22/2016  . Essential hypertension, benign 10/28/2014  . H/O measles   . H/O mumps   . History of chicken pox   . Lumbago 11/02/2014  . Mixed hyperlipidemia 10/28/2014  . Obesity 10/28/2014    Past Surgical History:  Procedure Laterality Date  . BACK SURGERY    . BREAST REDUCTION SURGERY Left   . CHOLECYSTECTOMY    . EYE SURGERY  2008   b/l cataracts removed, in [place]  . MASTECTOMY Right   . REDUCTION MAMMAPLASTY    . REPLACEMENT TOTAL KNEE BILATERAL Bilateral   . ROTATOR CUFF REPAIR Left     There were no vitals filed for this visit.  Subjective Assessment - 08/28/17 0931    Subjective  Pt reports she "wrenched her back" on Thursday trying to get up from "a super low toilet" - still had some pain upon waking this morning but no pain currently. Reporting limited tolerance for walking while in [place] last week.    Patient Stated Goals  "get moving - build up some  strength and feel steadier on my feet"    Currently in Pain?  No/denies                       [facility] Adult PT Treatment/Exercise - 08/28/17 0928      Exercises   Exercises  Knee/Hip      Knee/Hip Exercises: Aerobic   Nustep  L5 x 6 min (LE only)      Knee/Hip Exercises: Standing   Lateral Step Up  Both;5 reps;Hand Hold: 2;Step Height: 4"    Lateral Step Up Limitations  cues to avoid rotating body/feet    Forward Step Up  Both;5 reps;Hand Hold: 1;Step Height: 4"    Forward Step Up Limitations  improved posture with decreased need for UE assist    Step Down  Right;Left;10 reps;Hand Hold: 2;Step Height: 4"    Step Down Limitations  eccentric lowering - better control on R than L    Functional Squat  15 reps;3 seconds    Functional Squat Limitations  TRX - focus on eccetric lowering with chair behind pt for targeting proper motion and to prevent too deep of squat  Balance Exercises - 08/28/17 0928      Balance Exercises: Standing   SLS with Vectors  Solid surface;Foam/compliant surface;5 reps;Upper extremity assist 1 2 sets: solid 3 way to colored disc, foam oval to pebbles    Step Over Hurdles / Cones  B Lateral step-over 1/2 FR x 5 - TRX support          PT Short Term Goals - 08/21/17 0935      PT SHORT TERM GOAL #1   Title  Independent with initial HEP ([place] program at supported level as inidicated)    Status  Achieved      PT SHORT TERM GOAL #2   Title  Increase overall LE strength by 1/2 - 1 grade for improved stability    Status  Achieved      PT SHORT TERM GOAL #3   Title  Increase Berg to 38/56 to reduce fall risk    Status  Achieved        PT Long Term Goals - 07/19/17 0943      PT LONG TERM GOAL #1   Title  Independent with advanced/ongoing HEP    Status  On-going      PT LONG TERM GOAL #2   Title  Increase overall LE strength to >/= 4-/5 to 4/5 for improved stability and activity tolerance    Status  On-going      PT LONG  TERM GOAL #3   Title  Increase Berg to 46/56 to reduce risk for falls    Status  On-going      PT LONG TERM GOAL #4   Title  Decrease TUG to </=13.5 sec to increase safety with transitions    Status  On-going      PT LONG TERM GOAL #5   Title  Increase FGA to >/= 19/30 to decrease risk for falls with community ambulation    Status  On-going            Plan - 08/28/17 0935    Clinical Impression Statement  Amanda Copeland continues to report fatigue and limited activity tolerance with standing and walking activities as well as difficulty rising to standing from sitting while on vacation last week. Targeted strengthening and balance activities to improve mobility and stability with limited tolerance/endurance and fatigue requring decreased reps and frequent seated rest breaks.    Rehab Potential  Good    PT Treatment/Interventions  Patient/family education;ADLs/Self Care Home Management;Neuromuscular re-education;Therapeutic exercise;Therapeutic activities;Functional mobility training;Gait training;Stair training;Manual techniques;Vestibular    Consulted and Agree with Plan of Care  Patient       Patient will benefit from skilled therapeutic intervention in order to improve the following deficits and impairments:  Decreased balance, Decreased strength, Decreased coordination, Decreased activity tolerance, Difficulty walking, Abnormal gait, Decreased mobility, Decreased endurance  Visit Diagnosis: Unsteadiness on feet  Difficulty in walking, not elsewhere classified  Muscle weakness (generalized)     Problem List Patient Active Problem List   Diagnosis Date Noted  . Fatigue 07/12/2017  . Morbid obesity ([place]) 03/05/2017  . Chronic diastolic heart failure ([place]) 11/29/2016  . Dyspnea on exertion 10/06/2016  . Cough variant asthma  vs UACS 10/06/2016  . Encounter for Medicare annual wellness exam 05/22/2016  . Hyperglycemia 08/07/2015  . Allergic 08/07/2015  . Gout 08/07/2015  .  Leukocytosis 08/07/2015  . Lower abdominal pain 04/21/2015  . Back pain 11/02/2014  . Esophageal reflux 10/28/2014  . Essential hypertension, benign 10/28/2014  . Mixed hyperlipidemia 10/28/2014  .  Benign essential tremor 10/28/2014  . Bilateral dry eyes   . Asthma   . Arthritis   . Cancer ([place])   . History of chicken pox     Percival Spanish, PT, MPT 08/28/2017, 10:30 AM  [facility] [address]  [place], [place], [address] Phone: [phone]   Fax:  [phone]  Name: Amanda Copeland MRN: [id number] Date of Birth: 07/13/1940

## 2017-08-30 ENCOUNTER — Encounter: Payer: Self-pay | Admitting: Physical Therapy

## 2017-08-30 ENCOUNTER — Ambulatory Visit: Payer: Medicare Other | Admitting: Physical Therapy

## 2017-08-30 DIAGNOSIS — M6281 Muscle weakness (generalized): Secondary | ICD-10-CM

## 2017-08-30 DIAGNOSIS — R262 Difficulty in walking, not elsewhere classified: Secondary | ICD-10-CM | POA: Diagnosis not present

## 2017-08-30 DIAGNOSIS — R2681 Unsteadiness on feet: Secondary | ICD-10-CM

## 2017-08-30 NOTE — Therapy (Addendum)
[facility] [address]  [place], [place], [address] Phone: [phone]   Fax:  [phone]  Physical Therapy Treatment  Patient Details  Name: Amanda Copeland MRN: [id number] Date of Birth: [name] 12, 1941 Referring Provider: Alonza Bogus, MD  Progress Note Reporting Period: 07/17/2017 to 08/30/2017  See note below for Objective Data and Assessment of Progress/Goals.       Encounter Date: 08/30/2017  PT End of Session - 08/30/17 0931    Visit Number  10   Number of Visits  20    Date for PT Re-Evaluation  10/11/17    Authorization Type  Medicare & BCBS    PT Start Time  [phone]    PT Stop Time  1016   PT Time Calculation (min)  45 min    Activity Tolerance  Patient tolerated treatment well    Behavior During Therapy  [facility] for tasks assessed/performed       Past Medical History:  Diagnosis Date  . Arthritis   . Asthma    childhood now returning  . Back pain affecting pregnancy 11/02/2014  . Benign essential tremor 10/28/2014  . Bilateral dry eyes   . Breast cancer ([place])    right  . Cancer (HCC)    Breast  . Dry eyes   . Encounter for Medicare annual wellness exam 05/22/2016  . Essential hypertension, benign 10/28/2014  . H/O measles   . H/O mumps   . History of chicken pox   . Lumbago 11/02/2014  . Mixed hyperlipidemia 10/28/2014  . Obesity 10/28/2014    Past Surgical History:  Procedure Laterality Date  . BACK SURGERY    . BREAST REDUCTION SURGERY Left   . CHOLECYSTECTOMY    . EYE SURGERY  2008   b/l cataracts removed, in [place]  . MASTECTOMY Right   . REDUCTION MAMMAPLASTY    . REPLACEMENT TOTAL KNEE BILATERAL Bilateral   . ROTATOR CUFF REPAIR Left     There were no vitals filed for this visit.  Subjective Assessment - 08/30/17 1203    Subjective Pt reports that she is as good as she ever is today. Not in any pain today and was able to climb the steps at the movie theatre yesterday  independently.    Patient Stated Goals  "get moving - build up some strength and feel steadier on my feet"    Currently in Pain?  No/denies         [facility] PT Assessment - 07/17/17    Strength    Strength Assessment Site  Hip;Knee;Ankle    Right/Left Hip  Right;Left    Right Hip Flexion  4-/5    Left Hip Flexion  4-/5    Right Knee Flexion  4/5    Right Knee Extension  4/5    Left Knee Flexion  4-/5    Left Knee Extension  4/5        Ambulation/Gait   Gait velocity  2.48 ft/sec        Standardized Balance Assessment   Standardized Balance Assessment  Berg Balance Test;Timed Up and Go Test;10 meter walk test;Five Times Sit to Stand    Five times sit to stand comments   26.56    10 Meter Walk  14.16"        Berg Balance Test   Sit to Stand  Able to stand without using hands and stabilize independently    Standing Unsupported  Able to stand 2  minutes with supervision    Sitting with Back Unsupported but Feet Supported on Floor or Stool  Able to sit safely and securely 2 minutes    Stand to Sit Sits safely with minimal use of hands    Transfers  Able to transfer safely, minor use of hands    Standing Unsupported with Eyes Closed  Able to stand 10 seconds safely   Standing Ubsupported with Feet Together  Able to place feet together independently and stand for 1 minute with supervision unable to get feet together d/t leg girth    From Standing, Reach Forward with Outstretched Arm  Can reach forward >12 cm safely (5")    From Standing Position, Pick up Object from Floor Able to pick up shoe, needs supervision   From Standing Position, Turn to Look Behind Over each Shoulder  Turn sideways only but maintains balance    Turn 360 Degrees  Able to turn 360 degrees safely but slowly    Standing Unsupported, Alternately Place Feet on Step/Stool Able to complete >2 steps/needs minimal assist   Standing Unsupported, One Foot in Front  Able to take smal step  independently and hold 30 sec   Standing on One Leg  Tries to lift leg/unable to hold 3 seconds but remains standing independently    Total Score  41   Berg comment:  37-45 = Significant (>80%) fall risk         Timed Up and Go Test   Normal TUG (seconds)  14.63       Functional Gait  Assessment   Gait assessed   Yes    Gait Level Surface  Walks 20 ft, slow speed, abnormal gait pattern, evidence for imbalance or deviates 10-15 in outside of the 12 in walkway width. Requires more than 7 sec to ambulate 20 ft.    Change in Gait Speed  Makes only minor adjustments to walking speed, or accomplishes a change in speed with significant gait deviations, deviates 10-15 in outside the 12 in walkway width, or changes speed but loses balance but is able to recover and continue walking.    Gait with Horizontal Head Turns  Performs head turns smoothly with slight changes in gait velocity (eg, minor disruption to smooth gait path), deviates 6-10 in outside 12 in walkway width, or uses an assistive device.    Gait with Vertical Head Turns  Performs head turns smoothly with slight changes in gait velocity (eg, minor disruption to smooth gait path), deviates 6-10 in outside 12 in walkway width, or uses an assistive device.      Gait and Pivot Turn Pivot turns safely within 3 sec and stops quickly with no loss of balance.   Step Over Obstacle  Is able to step over one shoe box (4.5 in total height) but must slow down and adjust steps to clear box safely. May require verbal cueing.    Gait with Narrow Base of Support  Ambulates less than 4 steps heel to toe or cannot perform without assistance.    Gait with Eyes Closed  Walks 20 ft slow speed, abnormal gait pattern, evidence for imbalance, deviates 10-15 in outside 12 in walkway width. Requires more than 9 sec to walk 20 feet.    Ambulating Backwards  Walks 20 ft slow speed, abnormal gait pattern, evidence for imbalance, deviates 10-15 in outside 12  in walkway width.    Steps  Two feet to a stair, must use rail.    Total Score  13   FGA comment:  < 19 = high risk fal                   [facility] Adult PT Treatment/Exercise - 08/30/17      Knee/Hip Exercises: Aerobic   Nustep  L5 x 6 min (B UE/LE)               PT Short Term Goals - 08/21/17 0935      PT SHORT TERM GOAL #1   Title  Independent with initial HEP ([place] program at supported level as inidicated)    Status  Achieved      PT SHORT TERM GOAL #2   Title  Increase overall LE strength by 1/2 - 1 grade for improved stability    Status  Achieved      PT SHORT TERM GOAL #3   Title  Increase Berg to 38/56 to reduce fall risk    Status  Achieved        PT Long Term Goals - 08/30/17 1206      PT LONG TERM GOAL #1   Title  Independent with advanced/ongoing HEP    Status  On-going    Target Date  10/11/17      PT LONG TERM GOAL #2   Title  Increase overall LE strength to >/= 4-/5 to 4/5 for improved stability and activity tolerance    Status  On-going    Target Date  10/11/17      PT LONG TERM GOAL #3   Title  Increase Berg to 46/56 to reduce risk for falls    Status  On-going    Target Date  10/11/17      PT LONG TERM GOAL #4   Title  Decrease TUG to </=13.5 sec to increase safety with transitions    Time  -- 14.63    Status  On-going    Target Date  10/11/17      PT LONG TERM GOAL #5   Title  Increase FGA to >/= 19/30 to decrease risk for falls with community ambulation    Baseline  -- 13    Status  On-going    Target Date  10/11/17            Plan - 09/04/17 0938    Clinical Impression Statement Amanda Copeland is making significant progress toward goals and has improved her FGA score  from 4/30 to 13/30 and her TUG time has improved by almost 5 seconds since the start of this episode of care, although she remains at high fall risk (<19/30). Although she has made significant progress and has met all of her short-term goals, further  progress can be made toward improving her LE strength and balance abilities, as patient continues to have difficulty with activities involving single limb stance for any period of time, and stepping over obstacles continues to be a challenge. At this time pt will continue to benefit from physical therapy for 10 additional visits 2x/week to improve LE strength and balance abilities, meet patient's functional goals, and long-term goals.   Rehab Potential  Good    PT Treatment/Interventions  Patient/family education;ADLs/Self Care Home Management;Neuromuscular re-education;Therapeutic exercise;Therapeutic activities;Functional mobility training;Gait training;Stair training;Manual techniques;Vestibular    Consulted and Agree with Plan of Care  Patient       Patient will benefit from skilled therapeutic intervention in order to improve the following deficits and impairments:  Decreased balance, Decreased strength, Decreased coordination, Decreased activity tolerance, Difficulty walking,  Abnormal gait, Decreased mobility, Decreased endurance  Visit Diagnosis: Unsteadiness on feet - Plan: PT plan of care cert/re-cert  Difficulty in walking, not elsewhere classified - Plan: PT plan of care cert/re-cert  Muscle weakness (generalized) - Plan: PT plan of care cert/re-cert     Problem List Patient Active Problem List   Diagnosis Date Noted  . Fatigue 07/12/2017  . Morbid obesity ([place]) 03/05/2017  . Chronic diastolic heart failure ([place]) 11/29/2016  . Dyspnea on exertion 10/06/2016  . Cough variant asthma  vs UACS 10/06/2016  . Encounter for Medicare annual wellness exam 05/22/2016  . Hyperglycemia 08/07/2015  . Allergic 08/07/2015  . Gout 08/07/2015  . Leukocytosis 08/07/2015  . Lower abdominal pain 04/21/2015  . Back pain 11/02/2014  . Esophageal reflux 10/28/2014  . Essential hypertension, benign 10/28/2014  . Mixed hyperlipidemia 10/28/2014  . Benign essential tremor 10/28/2014  .  Bilateral dry eyes   . Asthma   . Arthritis   . Cancer ([place])   . History of chicken pox       Shirline Frees, SPT 09/04/2017, 12:34 PM  [facility] [address]  [place], [place], [address] Phone: [phone]   Fax:  [phone]  Name: Amanda Copeland MRN: [id number] Date of Birth: 25-Sep-1940

## 2017-09-04 ENCOUNTER — Ambulatory Visit: Payer: Medicare Other | Admitting: Physical Therapy

## 2017-09-04 ENCOUNTER — Encounter: Payer: Self-pay | Admitting: Physical Therapy

## 2017-09-04 DIAGNOSIS — R2681 Unsteadiness on feet: Secondary | ICD-10-CM

## 2017-09-04 DIAGNOSIS — R262 Difficulty in walking, not elsewhere classified: Secondary | ICD-10-CM | POA: Diagnosis not present

## 2017-09-04 DIAGNOSIS — M6281 Muscle weakness (generalized): Secondary | ICD-10-CM | POA: Diagnosis not present

## 2017-09-04 NOTE — Therapy (Signed)
[facility] [address]  [place], [place], [address] Phone: [phone]   Fax:  [phone]  Physical Therapy Treatment  Patient Details  Name: Amanda Copeland MRN: [id number] Date of Birth: January 01, 1941 Referring Provider: Alonza Bogus, MD   Encounter Date: 09/04/2017  PT End of Session - 09/04/17 0931    Visit Number  11    Number of Visits  20    Date for PT Re-Evaluation  10/11/17    Authorization Type  Medicare & BCBS    PT Start Time  [phone]    PT Stop Time  1013    PT Time Calculation (min)  42 min    Activity Tolerance  Patient tolerated treatment well    Behavior During Therapy  [facility] for tasks assessed/performed       Past Medical History:  Diagnosis Date  . Arthritis   . Asthma    childhood now returning  . Back pain affecting pregnancy 11/02/2014  . Benign essential tremor 10/28/2014  . Bilateral dry eyes   . Breast cancer ([place])    right  . Cancer (HCC)    Breast  . Dry eyes   . Encounter for Medicare annual wellness exam 05/22/2016  . Essential hypertension, benign 10/28/2014  . H/O measles   . H/O mumps   . History of chicken pox   . Lumbago 11/02/2014  . Mixed hyperlipidemia 10/28/2014  . Obesity 10/28/2014    Past Surgical History:  Procedure Laterality Date  . BACK SURGERY    . BREAST REDUCTION SURGERY Left   . CHOLECYSTECTOMY    . EYE SURGERY  2008   b/l cataracts removed, in [place]  . MASTECTOMY Right   . REDUCTION MAMMAPLASTY    . REPLACEMENT TOTAL KNEE BILATERAL Bilateral   . ROTATOR CUFF REPAIR Left     There were no vitals filed for this visit.  Subjective Assessment - 09/04/17 0934    Subjective  Pt reports she went to a baseball game over the weekend and did well negotiating the stairs in the stands. Did note some discomfort/pain in her R hip while walking out of the stadium, which resloved on it's own with rest.    Patient Stated Goals  "get moving - build up some strength  and feel steadier on my feet"    Currently in Pain?  No/denies                       [facility] Adult PT Treatment/Exercise - 09/04/17 0931      Exercises   Exercises  Knee/Hip      Knee/Hip Exercises: Aerobic   Nustep  L6 x 6 min (LE only)      Knee/Hip Exercises: Standing   Heel Raises  Both;2 seconds;10 reps;2 sets    Hip Flexion  Both;10 reps;Knee straight;Stengthening    Hip Flexion Limitations  Red TB, UE support on back of chair    Hip ADduction  Both;10 reps;Strengthening    Hip ADduction Limitations  Red TB, UE support on back of chair    Hip Abduction  Both;10 reps;Knee straight;Stengthening    Abduction Limitations  Red TB, UE support on back of chair    Hip Extension  Both;10 reps;Knee straight;Stengthening    Extension Limitations  Red TB, UE support on back of chair          Balance Exercises - 09/04/17 0931      Balance Exercises:  Standing   Step Over Hurdles / Cones  Fwd step-over 4 consecutive hurdles (1/FR & 3 doubled balance pebbles) 2 passes each step-to leading with R/L, 2 passes step-through - intermittent UE support on counter    Other Standing Exercises  Toe clears from firm surface to 6" step x 20, from Airex pad to 8" step x10 - intermittent UE support on counter          PT Short Term Goals - 08/21/17 0935      PT SHORT TERM GOAL #1   Title  Independent with initial HEP ([place] program at supported level as inidicated)    Status  Achieved      PT SHORT TERM GOAL #2   Title  Increase overall LE strength by 1/2 - 1 grade for improved stability    Status  Achieved      PT SHORT TERM GOAL #3   Title  Increase Berg to 38/56 to reduce fall risk    Status  Achieved        PT Long Term Goals - 08/30/17 1206      PT LONG TERM GOAL #1   Title  Independent with advanced/ongoing HEP    Status  On-going    Target Date  10/11/17      PT LONG TERM GOAL #2   Title  Increase overall LE strength to >/= 4-/5 to 4/5 for improved  stability and activity tolerance    Status  On-going    Target Date  10/11/17      PT LONG TERM GOAL #3   Title  Increase Berg to 46/56 to reduce risk for falls    Status  On-going    Target Date  10/11/17      PT LONG TERM GOAL #4   Title  Decrease TUG to </=13.5 sec to increase safety with transitions    Time  -- 14.63    Status  On-going    Target Date  10/11/17      PT LONG TERM GOAL #5   Title  Increase FGA to >/= 19/30 to decrease risk for falls with community ambulation    Baseline  -- 13    Status  On-going    Target Date  10/11/17            Plan - 09/04/17 0938    Clinical Impression Statement  Nethra reporting increasing ease of community negotiation - able to navigate the baseball ball park including the bleachers over the weekend. Today's session focuing on continued LE strengthening as well as balance and dynamic gait activities targeting single limb support, weight shifting and step clearance to further improve mobility and gait stability to decrease risk for falls.    Rehab Potential  Good    PT Treatment/Interventions  Patient/family education;ADLs/Self Care Home Management;Neuromuscular re-education;Therapeutic exercise;Therapeutic activities;Functional mobility training;Gait training;Stair training;Manual techniques;Vestibular    Consulted and Agree with Plan of Care  Patient       Patient will benefit from skilled therapeutic intervention in order to improve the following deficits and impairments:  Decreased balance, Decreased strength, Decreased coordination, Decreased activity tolerance, Difficulty walking, Abnormal gait, Decreased mobility, Decreased endurance  Visit Diagnosis: Unsteadiness on feet  Difficulty in walking, not elsewhere classified  Muscle weakness (generalized)     Problem List Patient Active Problem List   Diagnosis Date Noted  . Fatigue 07/12/2017  . Morbid obesity ([place]) 03/05/2017  . Chronic diastolic heart failure ([place])  11/29/2016  . Dyspnea on  exertion 10/06/2016  . Cough variant asthma  vs UACS 10/06/2016  . Encounter for Medicare annual wellness exam 05/22/2016  . Hyperglycemia 08/07/2015  . Allergic 08/07/2015  . Gout 08/07/2015  . Leukocytosis 08/07/2015  . Lower abdominal pain 04/21/2015  . Back pain 11/02/2014  . Esophageal reflux 10/28/2014  . Essential hypertension, benign 10/28/2014  . Mixed hyperlipidemia 10/28/2014  . Benign essential tremor 10/28/2014  . Bilateral dry eyes   . Asthma   . Arthritis   . Cancer ([place])   . History of chicken pox     Percival Spanish, PT, MPT 09/04/2017, 10:43 AM  [facility] [address]  [place], [place], [address] Phone: [phone]   Fax:  [phone]  Name: Joane Postel MRN: [id number] Date of Birth: 1941-01-18

## 2017-09-06 ENCOUNTER — Encounter: Payer: Self-pay | Admitting: Physical Therapy

## 2017-09-06 ENCOUNTER — Ambulatory Visit: Payer: Medicare Other | Admitting: Physical Therapy

## 2017-09-06 DIAGNOSIS — M6281 Muscle weakness (generalized): Secondary | ICD-10-CM | POA: Diagnosis not present

## 2017-09-06 DIAGNOSIS — R2681 Unsteadiness on feet: Secondary | ICD-10-CM | POA: Diagnosis not present

## 2017-09-06 DIAGNOSIS — R262 Difficulty in walking, not elsewhere classified: Secondary | ICD-10-CM | POA: Diagnosis not present

## 2017-09-06 NOTE — Therapy (Addendum)
[facility] [address]  [place], [place], [address] Phone: [phone]   Fax:  [phone]  Physical Therapy Treatment  Patient Details  Name: Amanda Copeland MRN: [id number] Date of Birth: 14-[name]-1941 Referring Provider: Alonza Bogus, MD   Encounter Date: 09/06/2017  PT End of Session - 09/06/17 0933    Visit Number  12    Number of Visits  20    Date for PT Re-Evaluation  10/11/17    Authorization Type  Medicare & BCBS    PT Start Time  0930    PT Stop Time  1013    PT Time Calculation (min)  43 min    Equipment Utilized During Treatment  Gait belt    Activity Tolerance  Patient tolerated treatment well;Patient limited by fatigue    Behavior During Therapy  [facility] for tasks assessed/performed       Past Medical History:  Diagnosis Date  . Arthritis   . Asthma    childhood now returning  . Back pain affecting pregnancy 11/02/2014  . Benign essential tremor 10/28/2014  . Bilateral dry eyes   . Breast cancer ([place])    right  . Cancer (HCC)    Breast  . Dry eyes   . Encounter for Medicare annual wellness exam 05/22/2016  . Essential hypertension, benign 10/28/2014  . H/O measles   . H/O mumps   . History of chicken pox   . Lumbago 11/02/2014  . Mixed hyperlipidemia 10/28/2014  . Obesity 10/28/2014    Past Surgical History:  Procedure Laterality Date  . BACK SURGERY    . BREAST REDUCTION SURGERY Left   . CHOLECYSTECTOMY    . EYE SURGERY  2008   b/l cataracts removed, in [place]  . MASTECTOMY Right   . REDUCTION MAMMAPLASTY    . REPLACEMENT TOTAL KNEE BILATERAL Bilateral   . ROTATOR CUFF REPAIR Left     There were no vitals filed for this visit.  Subjective Assessment - 09/06/17 0934    Subjective  Pt reports that she is well today with no new complaints.     Patient Stated Goals  "get moving - build up some strength and feel steadier on my feet"    Currently in Pain?  No/denies                        [facility] Adult PT Treatment/Exercise - 09/06/17 0001      Knee/Hip Exercises: Aerobic   Nustep  L6 x 6 in (LE only)      Knee/Hip Exercises: Standing   Hip Abduction  Both;Limitations    Abduction Limitations  Yellow TB; UE support with VC for good eccentric control. At counter for UE support. Stepping sideways 10 ft in each direction      Knee/Hip Exercises: Seated   Marching  Both;10 reps    Marching Limitations  Alternating placing feet onto 9" step          Balance Exercises - 09/06/17 0950      Balance Exercises: Standing   Standing Eyes Opened  Foam/compliant surface;Other reps (comment)    Tandem Stance  Eyes open;Upper extremity support 1;Limitations;Other reps (comment) CGA to min; 1 UE assist 8 reps, 0 for 4 reps; 10 ft     Stepping Strategy  Anterior;Posterior;Lateral;UE support;5 reps;Other reps (comment) Over 1/2 FB; 2 UE support for posterior; 5 each direction      Balance Exercises: Standing  Standing Eyes Opened Limitations  Standing on AirEx pad with CGA from therapist throwing ball back and forth x 20 reps          PT Short Term Goals - 08/21/17 0935      PT SHORT TERM GOAL #1   Title  Independent with initial HEP ([place] program at supported level as inidicated)    Status  Achieved      PT SHORT TERM GOAL #2   Title  Increase overall LE strength by 1/2 - 1 grade for improved stability    Status  Achieved      PT SHORT TERM GOAL #3   Title  Increase Berg to 38/56 to reduce fall risk    Status  Achieved        PT Long Term Goals - 08/30/17 1206      PT LONG TERM GOAL #1   Title  Independent with advanced/ongoing HEP    Status  On-going    Target Date  10/11/17      PT LONG TERM GOAL #2   Title  Increase overall LE strength to >/= 4-/5 to 4/5 for improved stability and activity tolerance    Status  On-going    Target Date  10/11/17      PT LONG TERM GOAL #3   Title  Increase Berg to 46/56 to reduce risk  for falls    Status  On-going    Target Date  10/11/17      PT LONG TERM GOAL #4   Title  Decrease TUG to </=13.5 sec to increase safety with transitions    Time  -- 14.63    Status  On-going    Target Date  10/11/17      PT LONG TERM GOAL #5   Title  Increase FGA to >/= 19/30 to decrease risk for falls with community ambulation    Baseline  -- 13    Status  On-going    Target Date  10/11/17            Plan - 09/06/17 1025    Clinical Impression Statement  Briggette is improving in her ability to maintain balance with narrow BOS and LE strength. During seated marches lifting feet onto stool pt able to place feet on small step with mild compensation with posterior lean, demonstrating improvements in core strength, LE strength, and anticipatory postural adjustments. With each rep of tandem stance pt demonstrated more confidence in ability and willingness to relinquish UE support, but had 2-3 moderate missteps with each rep. Netha continues to demonstrate considerable fatigue with activities and mild SOB.  Pt will continue to benefit from physical therapy to improve balance abilities, continue to improve LE strength, and continue to improve functional mobility.     Rehab Potential  Good    PT Treatment/Interventions  Patient/family education;ADLs/Self Care Home Management;Neuromuscular re-education;Therapeutic exercise;Therapeutic activities;Functional mobility training;Gait training;Stair training;Manual techniques;Vestibular    Consulted and Agree with Plan of Care  Patient       Patient will benefit from skilled therapeutic intervention in order to improve the following deficits and impairments:  Decreased balance, Decreased strength, Decreased coordination, Decreased activity tolerance, Difficulty walking, Abnormal gait, Decreased mobility, Decreased endurance  Visit Diagnosis: Unsteadiness on feet  Difficulty in walking, not elsewhere classified  Muscle weakness  (generalized)     Problem List Patient Active Problem List   Diagnosis Date Noted  . Fatigue 07/12/2017  . Morbid obesity ([place]) 03/05/2017  . Chronic diastolic heart  failure ([place]) 11/29/2016  . Dyspnea on exertion 10/06/2016  . Cough variant asthma  vs UACS 10/06/2016  . Encounter for Medicare annual wellness exam 05/22/2016  . Hyperglycemia 08/07/2015  . Allergic 08/07/2015  . Gout 08/07/2015  . Leukocytosis 08/07/2015  . Lower abdominal pain 04/21/2015  . Back pain 11/02/2014  . Esophageal reflux 10/28/2014  . Essential hypertension, benign 10/28/2014  . Mixed hyperlipidemia 10/28/2014  . Benign essential tremor 10/28/2014  . Bilateral dry eyes   . Asthma   . Arthritis   . Cancer ([place])   . History of chicken pox     Shirline Frees, SPT 09/06/2017, 12:37 PM  [facility] [address]  [place], [place], [address] Phone: [phone]   Fax:  [phone]  Name: Azayla Polo MRN: [id number] Date of Birth: 06-May-1940

## 2017-09-12 ENCOUNTER — Encounter: Payer: Self-pay | Admitting: Physical Therapy

## 2017-09-12 ENCOUNTER — Ambulatory Visit: Payer: Medicare Other | Attending: Neurology | Admitting: Physical Therapy

## 2017-09-12 DIAGNOSIS — R262 Difficulty in walking, not elsewhere classified: Secondary | ICD-10-CM | POA: Diagnosis not present

## 2017-09-12 DIAGNOSIS — R293 Abnormal posture: Secondary | ICD-10-CM | POA: Insufficient documentation

## 2017-09-12 DIAGNOSIS — R2681 Unsteadiness on feet: Secondary | ICD-10-CM | POA: Diagnosis not present

## 2017-09-12 DIAGNOSIS — M545 Low back pain: Secondary | ICD-10-CM | POA: Insufficient documentation

## 2017-09-12 DIAGNOSIS — M6281 Muscle weakness (generalized): Secondary | ICD-10-CM

## 2017-09-12 NOTE — Therapy (Addendum)
[facility] [address]  [place], [place], [address] Phone: [phone]   Fax:  [phone]  Physical Therapy Treatment  Patient Details  Name: Amanda Copeland MRN: [id number] Date of Birth: 09/03/1940 Referring Provider: Alonza Bogus, MD   Encounter Date: 09/12/2017  PT End of Session - 09/12/17 1017    Visit Number  13    Number of Visits  20    Date for PT Re-Evaluation  10/11/17    Authorization Type  Medicare & BCBS    PT Start Time  1015    PT Stop Time  1057    PT Time Calculation (min)  42 min    Equipment Utilized During Treatment  Gait belt    Activity Tolerance  Patient tolerated treatment well    Behavior During Therapy  WFL for tasks assessed/performed       Past Medical History:  Diagnosis Date  . Arthritis   . Asthma    childhood now returning  . Back pain affecting pregnancy 11/02/2014  . Benign essential tremor 10/28/2014  . Bilateral dry eyes   . Breast cancer ([place])    right  . Cancer (HCC)    Breast  . Dry eyes   . Encounter for Medicare annual wellness exam 05/22/2016  . Essential hypertension, benign 10/28/2014  . H/O measles   . H/O mumps   . History of chicken pox   . Lumbago 11/02/2014  . Mixed hyperlipidemia 10/28/2014  . Obesity 10/28/2014    Past Surgical History:  Procedure Laterality Date  . BACK SURGERY    . BREAST REDUCTION SURGERY Left   . CHOLECYSTECTOMY    . EYE SURGERY  2008   b/l cataracts removed, in [place]  . MASTECTOMY Right   . REDUCTION MAMMAPLASTY    . REPLACEMENT TOTAL KNEE BILATERAL Bilateral   . ROTATOR CUFF REPAIR Left     There were no vitals filed for this visit.  Subjective Assessment - 09/12/17 1239    Subjective  Pt reports that she is doing well today, and that her family mentions to her that they are noticing improvement in her physical abilities.     Limitations  House hold activities    Patient Stated Goals  "get moving - build up some  strength and feel steadier on my feet"    Currently in Pain?  No/denies         [facility] PT Assessment - 09/12/17 0001      Assessment   Next MD Visit  09/15/2017      Strength   Right Hip Extension  4-/5    Right Hip External Rotation   4-/5    Right Hip Internal Rotation  4/5    Right Hip ABduction  4/5    Right Hip ADduction  4-/5    Left Hip Extension  4-/5    Left Hip External Rotation  4-/5    Left Hip Internal Rotation  4/5    Left Hip ABduction  4/5    Left Hip ADduction  4-/5    Right/Left Ankle  Right;Left    Right Ankle Dorsiflexion  4/5    Right Ankle Plantar Flexion  4-/5    Left Ankle Dorsiflexion  4/5    Left Ankle Plantar Flexion  3/5                   [facility] Adult PT Treatment/Exercise - 09/12/17 1018  Knee/Hip Exercises: Aerobic   Nustep  L6 x 6 (LE only)      Knee/Hip Exercises: Standing   Forward Step Up  5 reps;Both;Hand Hold: 2;Step Height: 6"          Balance Exercises - 09/12/17 1038      Balance Exercises: Standing   Tandem Stance  Eyes open;2 reps With one foot in front of other; not true tandem     Stepping Strategy  Anterior;UE support 6 reps; 2 leading R LE, 2 L LE, 2 alternating over yoga mat    Tandem Gait  4 reps;Forward;Intermittent upper extremity support 10 ft each rep; CGA to min assist to prevent LOB      Balance Exercises: Standing   Tandem Stance Limitations  2 x 1 min with CGA from therapist    Tandem Gait Limitations  4 x 10 ft distance, CGA to min assist from therapist to prevent LOB. Pt demonstrated 2-3 missteps with each trial, increased time to complete, and reaching strategy and stepping strategy           PT Short Term Goals - 08/21/17 0935      PT SHORT TERM GOAL #1   Title  Independent with initial HEP ([place] program at supported level as inidicated)    Status  Achieved      PT SHORT TERM GOAL #2   Title  Increase overall LE strength by 1/2 - 1 grade for improved stability    Status   Achieved      PT SHORT TERM GOAL #3   Title  Increase Berg to 38/56 to reduce fall risk    Status  Achieved        PT Long Term Goals - 08/30/17 1206      PT LONG TERM GOAL #1   Title  Independent with advanced/ongoing HEP    Status  On-going    Target Date  10/11/17      PT LONG TERM GOAL #2   Title  Increase overall LE strength to >/= 4-/5 to 4/5 for improved stability and activity tolerance    Status  On-going    Target Date  10/11/17      PT LONG TERM GOAL #3   Title  Increase Berg to 46/56 to reduce risk for falls    Status  On-going    Target Date  10/11/17      PT LONG TERM GOAL #4   Title  Decrease TUG to </=13.5 sec to increase safety with transitions    Time  -- 14.63    Status  On-going    Target Date  10/11/17      PT LONG TERM GOAL #5   Title  Increase FGA to >/= 19/30 to decrease risk for falls with community ambulation    Baseline  -- 13    Status  On-going    Target Date  10/11/17            Plan - 09/12/17 1238    Clinical Impression Statement  Amanda Copeland is making progress with each visit. During today's session she was better able to maintain modified tandem stance with either foot in front of the other (> 1 min) and tandem gait with fewer missteps with each trial (2-3 with each 10 ft trial). Pt demonstrates greater confidence with her balance ability and is more willing to try new activities including stepping over obstacles during session with a reciprocal gait pattern. She demonstrates improved LE strength which  is helping to improve her functional mobility. At this time she will continue to benefit from physical therapy to improve her balance abilities to a functional level, and meet her functional goals.     Rehab Potential  Good    PT Treatment/Interventions  Patient/family education;ADLs/Self Care Home Management;Neuromuscular re-education;Therapeutic exercise;Therapeutic activities;Functional mobility training;Gait training;Stair training;Manual  techniques;Vestibular    PT Next Visit Plan  send visit note to MD    Consulted and Agree with Plan of Care  Patient       Patient will benefit from skilled therapeutic intervention in order to improve the following deficits and impairments:  Decreased balance, Decreased strength, Decreased coordination, Decreased activity tolerance, Difficulty walking, Abnormal gait, Decreased mobility, Decreased endurance  Visit Diagnosis: Unsteadiness on feet  Difficulty in walking, not elsewhere classified  Muscle weakness (generalized)     Problem List Patient Active Problem List   Diagnosis Date Noted  . Fatigue 07/12/2017  . Morbid obesity ([place]) 03/05/2017  . Chronic diastolic heart failure ([place]) 11/29/2016  . Dyspnea on exertion 10/06/2016  . Cough variant asthma  vs UACS 10/06/2016  . Encounter for Medicare annual wellness exam 05/22/2016  . Hyperglycemia 08/07/2015  . Allergic 08/07/2015  . Gout 08/07/2015  . Leukocytosis 08/07/2015  . Lower abdominal pain 04/21/2015  . Back pain 11/02/2014  . Esophageal reflux 10/28/2014  . Essential hypertension, benign 10/28/2014  . Mixed hyperlipidemia 10/28/2014  . Benign essential tremor 10/28/2014  . Bilateral dry eyes   . Asthma   . Arthritis   . Cancer ([place])   . History of chicken pox     Shirline Frees, SPT  09/12/2017, 3:30 PM  [facility] [address]  [place], [place], [address] Phone: [phone]   Fax:  [phone]  Name: Amanda Copeland MRN: [id number] Date of Birth: 07/10/1940

## 2017-09-13 NOTE — Progress Notes (Signed)
Subjective:   Amanda Copeland was seen in consultation in the movement disorder clinic at the request of [name], MD.  The evaluation is for tremor.  This patient is accompanied in the office by her spouse who supplements the history.   The patient is a 77 y.o. right handed female with a history of tremor.  The patient reports that she has had tremor in both hands for the last several years.  She went to a neurologist in [place] and was diagnosed with ET and was put on propranolol, 20 mg bid and it helped a lot without side effects.  There is a family hx of tremor in her father but he has PD.  There is no other family hx of tremor    Affected by caffeine:  No. (coffee is only decaf) Affected by alcohol:  unknown Affected by stress:  No. Affected by fatigue:  No. Spills soup if on spoon:  No. (not now that she is on the propranolol) Spills glass of liquid if full:  No. Affects ADL's (tying shoes, brushing teeth, etc):  No.   Outside reports reviewed: historical medical records.  10/09/15 update:  Pt returns for f/u re: ET.  The records that were made available to me were reviewed.   Reports that she is doing well on propranolol 20 mg bid.  Rarely she will forget the night pill and "i can tell in the AM."  No SE with medication.  She is under tons of stress.  Her son in law was dx with a bone cancer and he is getting aggressive chemo and needs an amputation.  Her mother in law is dying in [place].  She is helping to take care of the grandchildren.    04/11/16 update:  Patient returns today for follow-up.  She is on propranolol, 20 mg twice per day.  overall, she states that her tremor has been well controlled.  she denies falls.  denies lightheadedness or near syncope.  No new medical problems.  She states that she can tell if she skips medication.    10/12/16 update:  Patient seen today in follow-up for her essential tremor.  She was on propranolol, 20 mg twice per day but she saw Dr. Melvyn Novas  and states that she was changed to metoprolol instead (to see if her asthma would be better).  She is now on metoprolol 25 mg bid.  She thinks that tremor may be "coming back a little."   Breathing hasn't changed since making the change. I have reviewed records since last visit.  She has been to rehabilitation for low back pain.  She has followed up with Dr. Melvyn Novas in regards to asthma, as above.   She does ask if she needs all 3 blood pressure meds.  She denies hx of CHF or LE edema.    03/16/17 update: Patient is seen today in follow-up.  I have reviewed records and corresponded with her pulmonologist regarding her case.  Dr. Melvyn Novas asked me to take her off of propranolol.  Initially, I was somewhat confused as she was off of propranolol last visit, but it turns out she went back on the medication and it is felt that this is worsening her asthma, as evidenced by PFTs.  Had 2 falls since last visit.  With one she got up in the middle of the night and felt lightheaded and then fell.  With the other she was at movies and was coming down stairs and fell on  her butt.  05/08/17 update: Patient seen today in follow-up.  At our last visit, her pulmonologist had recommended discontinuation of her propranolol due to her asthma.  She called after this was done and stated that her tremor was much worse.  Primidone was increased to 50 mg twice per day.  She emailed me about a week later when she was on a cruise and stated that she was not tolerating the primidone well (made her sleep so deeply that she wet the bed).  She was not enjoying the cruise because of it.  She went to go back on the propranolol.  I told her she could go back on it for the cruise.  Reports today that she is still on the propranolol but she didn't know how much, but thinks it is 20 mg bid.  She reports tremor is worse.  States that she isn't coughing and lungs are "fine."   She is already on gabapentin.  She doesn't know why she is on that.  Reports some  twitching and jerking in the legs.  She went off of the primidone when she went off of the propranolol.   07/05/17 update: Patient is seen today in follow-up.  She is on propranolol, 20 mg twice per day.  Went to pulm yesterday and told that she shouldn't increase propranolol.  She was started on topiramate last visit as an add-on medication.  She reports that vocal tremor is worse and she is now having leg tremor that seems to interfere with walking.  She reports that her legs are "all over the place."  Legs are "jerky" and they feel week.  Legs are more "jerky" than tremulous.  Only on gabapentin for a few months.   She thinks that topamax has been helpful    09/15/17 update: Patient is seen today in follow-up for tremor.  Records have been reviewed since our last visit.  She is still on propranolol, 20 mg twice per day.  Tremor is well controlled with propranolol and she is pleased.  Last visit, the patient was complaining of what sounded like myoclonus.  I asked her to hold her gabapentin and we called her several weeks later to see how she was doing.  She stated that the jerking was gone off of gabapentin.  She emailed me in mid July to state that she wanted to stop the Topamax.  For some reason, she thought that was preventing her from jerking, even though it was the discontinuation of the gabapentin.  She felt Topamax is making her tired and fatigued and causing double vision.  This medication was weaned and discontinued.  She still feels that she has taste aversion and loss of hair.  No falls.  No new med problems.  Going to PT for balance and it is helping.  Getting ready to go on a 1 month to her/vacation.  They will have a lot walking.  Current/Previously tried tremor medications: propranolol (felt worsened asthma); metoprolol (didn't help); primidone (made too sleepy and didn't help at 100 mg daily)  Current medications that may exacerbate tremor:  Albuterol (doesn't notice that it affects tremor);  symbicort  Allergies  Allergen Reactions  . Amoxicillin Rash  . Ampicillin Rash  . Penicillins Rash  . Statins Other (See Comments)    Per reports muscle aches and joint pain    Outpatient Encounter Medications as of 09/15/2017  Medication Sig  . albuterol (PROAIR HFA) 108 (90 Base) MCG/ACT inhaler Inhale 2 puffs into the  lungs every 6 (six) hours as needed for wheezing or shortness of breath.  Marland Kitchen aspirin 81 MG tablet Take 81 mg by mouth daily.  Marland Kitchen diltiazem (TIAZAC) 180 MG 24 hr capsule Take 1 capsule (180 mg total) by mouth daily.  . fluticasone (FLONASE) 50 MCG/ACT nasal spray USE 2 SPRAYS IN EACH NOSTRIL AT BEDTIME  . furosemide (LASIX) 40 MG tablet Take 1 tablet (40 mg total) daily by mouth.  . montelukast (SINGULAIR) 10 MG tablet TAKE 1 TABLET BY MOUTH EVERY DAY NIGHTLY  . Multiple Vitamins-Minerals (ICAPS AREDS 2) CAPS Take 1 capsule by mouth 2 (two) times daily.  . Potassium Chloride ER 20 MEQ TBCR Take 20 mEq daily by mouth.  Marland Kitchen PREVACID 30 MG capsule Take 1 capsule (30 mg total) by mouth 2 (two) times daily.  . propranolol (INDERAL) 20 MG tablet TAKE 1 TABLET (20 MG TOTAL) BY MOUTH 2 (TWO) TIMES DAILY.  . Red Yeast Rice Extract (RED YEAST RICE PO) Take by mouth daily.  . [DISCONTINUED] allopurinol (ZYLOPRIM) 100 MG tablet Take 1 tablet (100 mg total) by mouth daily.  . [DISCONTINUED] diltiazem (TIAZAC) 180 MG 24 hr capsule TAKE 1 CAPSULE (180 MG TOTAL) BY MOUTH DAILY.  . [DISCONTINUED] KLOR-CON M20 20 MEQ tablet Take 20 mEq by mouth daily.  . [DISCONTINUED] topiramate (TOPAMAX) 100 MG tablet Take 1 tablet (100 mg total) by mouth daily.   No facility-administered encounter medications on file as of 09/15/2017.     Past Medical History:  Diagnosis Date  . Arthritis   . Asthma    childhood now returning  . Back pain affecting pregnancy 11/02/2014  . Benign essential tremor 10/28/2014  . Bilateral dry eyes   . Breast cancer ([place])    right  . Cancer (HCC)    Breast  . Dry eyes    . Encounter for Medicare annual wellness exam 05/22/2016  . Essential hypertension, benign 10/28/2014  . H/O measles   . H/O mumps   . History of chicken pox   . Lumbago 11/02/2014  . Mixed hyperlipidemia 10/28/2014  . Obesity 10/28/2014    Past Surgical History:  Procedure Laterality Date  . BACK SURGERY    . BREAST REDUCTION SURGERY Left   . CHOLECYSTECTOMY    . EYE SURGERY  2008   b/l cataracts removed, in [place]  . MASTECTOMY Right   . REDUCTION MAMMAPLASTY    . REPLACEMENT TOTAL KNEE BILATERAL Bilateral   . ROTATOR CUFF REPAIR Left     Social History   Socioeconomic History  . Marital status: Married    Spouse name: Not on file  . Number of children: Not on file  . Years of education: Not on file  . Highest education level: Not on file  Occupational History  . 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 Financial resource strain: Not on file  . Food insecurity:    Worry: Not on file    Inability: Not on file  . Transportation needs:    Medical: Not on file    Non-medical: Not on file  Tobacco Use  . Smoking status: Never Smoker  . Smokeless tobacco: Never Used  Substance and Sexual Activity  . Alcohol use: Yes    Alcohol/week: 0.0 standard drinks    Comment: two times a month  . Drug use: No  . Sexual activity: Not on file    Comment: lives with husband, moved [place], no dietary restrictions, retired  school teacher  Lifestyle  . Physical activity:    Days per week: Not on file    Minutes per session: Not on file  . Stress: Not on file  Relationships  . Social connections:    Talks on phone: Not on file    Gets together: Not on file    Attends religious service: Not on file    Active member of club or organization: Not on file    Attends meetings of clubs or organizations: Not on file    Relationship status: Not on file  . Intimate partner violence:    Fear of current or ex partner: Not on file    Emotionally abused: Not  on file    Physically abused: Not on file    Forced sexual activity: Not on file  Other Topics Concern  . Not on file  Social History Narrative  . Not on file    Family Status  Relation Name Status  . Mother 59 Deceased       heart disease  . Father 53 Deceased       colon cancer, Parkinson's Disease  . MGM 65ish Deceased  . MGF 45ish Deceased  . Daughter 28 Alive  . PGM 40ish Deceased  . PGF 40ish,accident Deceased  . Neg Hx  (Not Specified)    Review of Systems Review of Systems  Constitutional: Negative.   HENT:       Hoarse voice  Respiratory: Positive for shortness of breath.   Gastrointestinal: Negative.   Genitourinary: Negative.   Musculoskeletal: Negative.   Skin: Negative.   Neurological: Negative.       Objective:   VITALS:   Vitals:   09/15/17 0832  BP: 106/66  Pulse: 66  SpO2: 95%  Weight: 243 lb (110.2 kg)  Height: 5\' 2"  (1.575 m)   Physical Exam  Constitutional: She is oriented to person, place, and time and well-developed, well-nourished, and in no distress.  HENT:  Head: Normocephalic and atraumatic.  Voice raspy  Neck: Normal range of motion.  Cardiovascular: Normal rate and regular rhythm.  Pulmonary/Chest: Breath sounds normal.  Neurological: She is alert and oriented to person, place, and time.  Gait is wide-based, slow but fairly steady.  No decremation with any forms of rapid alternating movements.  No tremor is evident of the outstretched hands.  No intention tremor.  No rest tremor.  No tremor when given a weight.    Labs:    Chemistry      Component Value Date/Time   NA 142 07/11/2017 1627   NA 145 (H) 12/23/2016 1114   K 4.2 07/11/2017 1627   CL 106 07/11/2017 1627   CO2 25 07/11/2017 1627   BUN 19 07/11/2017 1627   BUN 14 12/23/2016 1114   CREATININE 0.79 07/11/2017 1627      Component Value Date/Time   CALCIUM 9.3 07/11/2017 1627   ALKPHOS 89 07/11/2017 1627   AST 15 07/11/2017 1627   ALT 12 07/11/2017 1627    BILITOT 0.3 07/11/2017 1627     Lab Results  Component Value Date   TSH 3.04 07/11/2017        Assessment/Plan:   1.  Essential Tremor.  -Dr. Melvyn Novas noted worsening of asthma on propranolol and attempted to discontinue this, but the patient's tremor got much worse.  She went back on the propranolol, 20 mg bid.  She is doing well on propranolol, 20 mg twice per day.  Propranolol was refilled.  She failed  primidone in the past.  Topamax was not helpful and she thought it caused side effects and was discontinued.  2.  Gait instability  -In physical therapy and doing well. 3.  Myoclonus  -Resolved off of gabapentin. 4.  F/u 1 year

## 2017-09-14 ENCOUNTER — Encounter: Payer: Self-pay | Admitting: Physical Therapy

## 2017-09-14 ENCOUNTER — Ambulatory Visit: Payer: Medicare Other | Admitting: Physical Therapy

## 2017-09-14 DIAGNOSIS — R293 Abnormal posture: Secondary | ICD-10-CM | POA: Diagnosis not present

## 2017-09-14 DIAGNOSIS — R2681 Unsteadiness on feet: Secondary | ICD-10-CM | POA: Diagnosis not present

## 2017-09-14 DIAGNOSIS — R262 Difficulty in walking, not elsewhere classified: Secondary | ICD-10-CM | POA: Diagnosis not present

## 2017-09-14 DIAGNOSIS — M6281 Muscle weakness (generalized): Secondary | ICD-10-CM

## 2017-09-14 DIAGNOSIS — M545 Low back pain: Secondary | ICD-10-CM | POA: Diagnosis not present

## 2017-09-14 NOTE — Therapy (Addendum)
[facility] [address]  [place], [place], [address] Phone: [phone]   Fax:  [phone]  Physical Therapy Treatment  Patient Details  Name: Amanda Copeland MRN: [id number] Date of Birth: [birth year] Referring Provider: Alonza Bogus, MD   Encounter Date: 09/14/2017  PT End of Session - 09/14/17 0933    Visit Number  14    Number of Visits  20    Date for PT Re-Evaluation  10/11/17    Authorization Type  Medicare & BCBS    PT Start Time  0929    PT Stop Time  1011    PT Time Calculation (min)  42 min    Equipment Utilized During Treatment  Gait belt    Activity Tolerance  Patient tolerated treatment well    Behavior During Therapy  [facility] for tasks assessed/performed       Past Medical History:  Diagnosis Date  . Arthritis   . Asthma    childhood now returning  . Back pain affecting pregnancy 11/02/2014  . Benign essential tremor 10/28/2014  . Bilateral dry eyes   . Breast cancer ([place])    right  . Cancer (HCC)    Breast  . Dry eyes   . Encounter for Medicare annual wellness exam 05/22/2016  . Essential hypertension, benign 10/28/2014  . H/O measles   . H/O mumps   . History of chicken pox   . Lumbago 11/02/2014  . Mixed hyperlipidemia 10/28/2014  . Obesity 10/28/2014    Past Surgical History:  Procedure Laterality Date  . BACK SURGERY    . BREAST REDUCTION SURGERY Left   . CHOLECYSTECTOMY    . EYE SURGERY  2008   b/l cataracts removed, in [place]  . MASTECTOMY Right   . REDUCTION MAMMAPLASTY    . REPLACEMENT TOTAL KNEE BILATERAL Bilateral   . ROTATOR CUFF REPAIR Left     There were no vitals filed for this visit.  Subjective Assessment - 09/14/17 0931    Subjective  Pt notes no new concerns today.     Limitations  House hold activities    How long can you stand comfortably?  10 minutes    How long can you walk comfortably?  10 minutes    Patient Stated Goals  "get moving - build up some  strength and feel steadier on my feet"    Currently in Pain?  No/denies                       [facility] Adult PT Treatment/Exercise - 09/14/17 0001      High Level Balance   High Level Balance Activities  Negotiating over obstacles    High Level Balance Comments  Forward/backward; 2 x each direction; 3 mats;Cane R UE; L LE clearance difficulty       Exercises   Exercises  Lumbar      Knee/Hip Exercises: Standing   Forward Step Up  Both;20 reps;Hand Hold: 0    Forward Step Up Limitations  9" step; VC for tempo; Both; 10 reps seated; 10 standing    Other Standing Knee Exercises  Standing holding 2000 g ball and lifting overhead to tolerance x 10. Pt demonstrated difficulty with activity and was unable to lift greater than approximatley shoulder level.     Other Standing Knee Exercises  Wall angels; 10 reps standing on level ground; 10 reps standing on AirEx Pad  Balance Exercises - 09/14/17 0958      Balance Exercises: Standing   SLS  Eyes open;Solid surface;Upper extremity support 1;Intermittent upper extremity support;2 reps;Time;Limitations    Stepping Strategy  Anterior;Posterior;UE support   2 x ea; 3 mats;Cane R UE; L LE clearance difficulty    Step Ups  --    Other Standing Exercises  Walking 1 lap around clinic (90 ft) tossing/catching ball. Pt lost ball or gait pattern 2 times during exercise.       Balance Exercises: Standing   SLS Time  Up to 7 seconds on R with no UE assist; up to 10 seconds on both with 1 finger assist     SLS Limitations  At counter for intermittent UE support          PT Short Term Goals - 08/21/17 0935      PT SHORT TERM GOAL #1   Title  Independent with initial HEP ([place] program at supported level as inidicated)    Status  Achieved      PT SHORT TERM GOAL #2   Title  Increase overall LE strength by 1/2 - 1 grade for improved stability    Status  Achieved      PT SHORT TERM GOAL #3   Title  Increase Berg to 38/56  to reduce fall risk    Status  Achieved        PT Long Term Goals - 08/30/17 1206      PT LONG TERM GOAL #1   Title  Independent with advanced/ongoing HEP    Status  On-going    Target Date  10/11/17      PT LONG TERM GOAL #2   Title  Increase overall LE strength to >/= 4-/5 to 4/5 for improved stability and activity tolerance    Status  On-going    Target Date  10/11/17      PT LONG TERM GOAL #3   Title  Increase Berg to 46/56 to reduce risk for falls    Status  On-going    Target Date  10/11/17      PT LONG TERM GOAL #4   Title  Decrease TUG to </=13.5 sec to increase safety with transitions    Time  --   14.63   Status  On-going    Target Date  10/11/17      PT LONG TERM GOAL #5   Title  Increase FGA to >/= 19/30 to decrease risk for falls with community ambulation    Baseline  --   13   Status  On-going    Target Date  10/11/17            Plan - 09/14/17 1319    Clinical Impression Statement  Marcie Bal tolerated treatment session well today focusing on strengthening and core activation activities. She continues to demonstrate difficulty with stepping strategy when amb backwards and requires 1 UE assist for stability. She is able to stand in SLS for longer time periods with less UE support, and is better able to alternate stepping onto a stool with adequate anticipatory postural adjustments. She will continue to benefit from physical therapy to address her core activation and strength as it relates to balance and stability for LE movements.     Rehab Potential  Good    PT Treatment/Interventions  Patient/family education;ADLs/Self Care Home Management;Neuromuscular re-education;Therapeutic exercise;Therapeutic activities;Functional mobility training;Gait training;Stair training;Manual techniques;Vestibular    Consulted and Agree with Plan of Care  Patient  Patient will benefit from skilled therapeutic intervention in order to improve the following deficits and  impairments:  Decreased balance, Decreased strength, Decreased coordination, Decreased activity tolerance, Difficulty walking, Abnormal gait, Decreased mobility, Decreased endurance  Visit Diagnosis: Unsteadiness on feet  Difficulty in walking, not elsewhere classified  Muscle weakness (generalized)     Problem List Patient Active Problem List   Diagnosis Date Noted  . Fatigue 07/12/2017  . Morbid obesity ([place]) 03/05/2017  . Chronic diastolic heart failure ([place]) 11/29/2016  . Dyspnea on exertion 10/06/2016  . Cough variant asthma  vs UACS 10/06/2016  . Encounter for Medicare annual wellness exam 05/22/2016  . Hyperglycemia 08/07/2015  . Allergic 08/07/2015  . Gout 08/07/2015  . Leukocytosis 08/07/2015  . Lower abdominal pain 04/21/2015  . Back pain 11/02/2014  . Esophageal reflux 10/28/2014  . Essential hypertension, benign 10/28/2014  . Mixed hyperlipidemia 10/28/2014  . Benign essential tremor 10/28/2014  . Bilateral dry eyes   . Asthma   . Arthritis   . Cancer ([place])   . History of chicken pox     Shirline Frees, SPT  09/14/2017, 3:09 PM  [facility] [address]  [place], [place], [address] Phone: [phone]   Fax:  [phone]  Name: Kao Berkheimer MRN: [id number] Date of Birth: Oct 13, 1940

## 2017-09-15 ENCOUNTER — Ambulatory Visit (INDEPENDENT_AMBULATORY_CARE_PROVIDER_SITE_OTHER): Payer: Medicare Other | Admitting: Neurology

## 2017-09-15 ENCOUNTER — Encounter: Payer: Self-pay | Admitting: Neurology

## 2017-09-15 VITALS — BP 106/66 | HR 66 | Ht 62.0 in | Wt 243.0 lb

## 2017-09-15 DIAGNOSIS — R251 Tremor, unspecified: Secondary | ICD-10-CM

## 2017-09-15 DIAGNOSIS — G253 Myoclonus: Secondary | ICD-10-CM | POA: Diagnosis not present

## 2017-09-15 MED ORDER — PROPRANOLOL HCL 20 MG PO TABS: 20.0000 mg | ORAL_TABLET | Freq: Two times a day (BID) | ORAL | 3 refills | Status: DC

## 2017-09-18 IMAGING — CR DG CHEST 2V
2 series · 2 of 2 positions shown · non-contrast
Comparison: 02/12/2015

CLINICAL DATA: Cough for 3-4 weeks, asthma

EXAM:
CHEST  2 VIEW

[w chest pa]
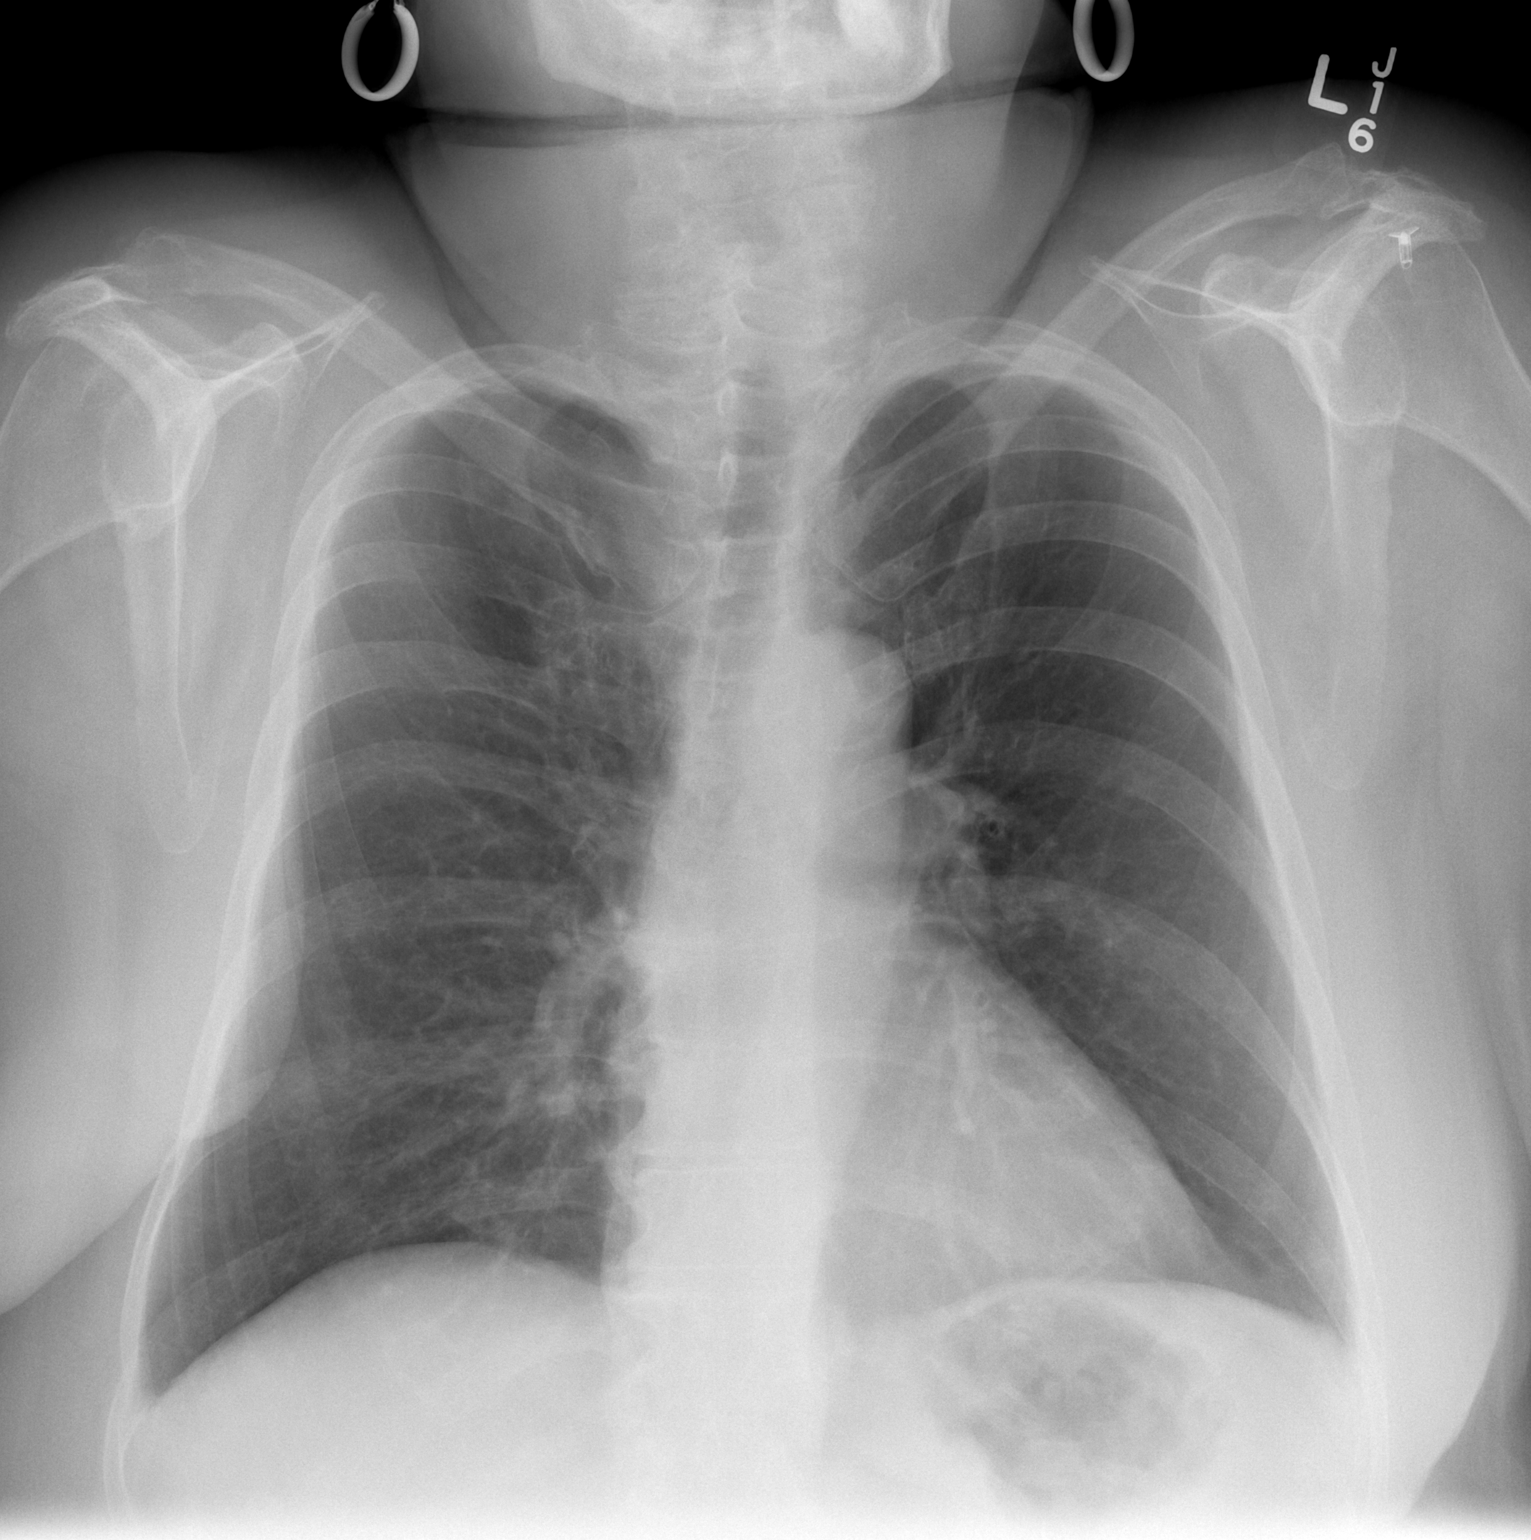

[w chest lat]
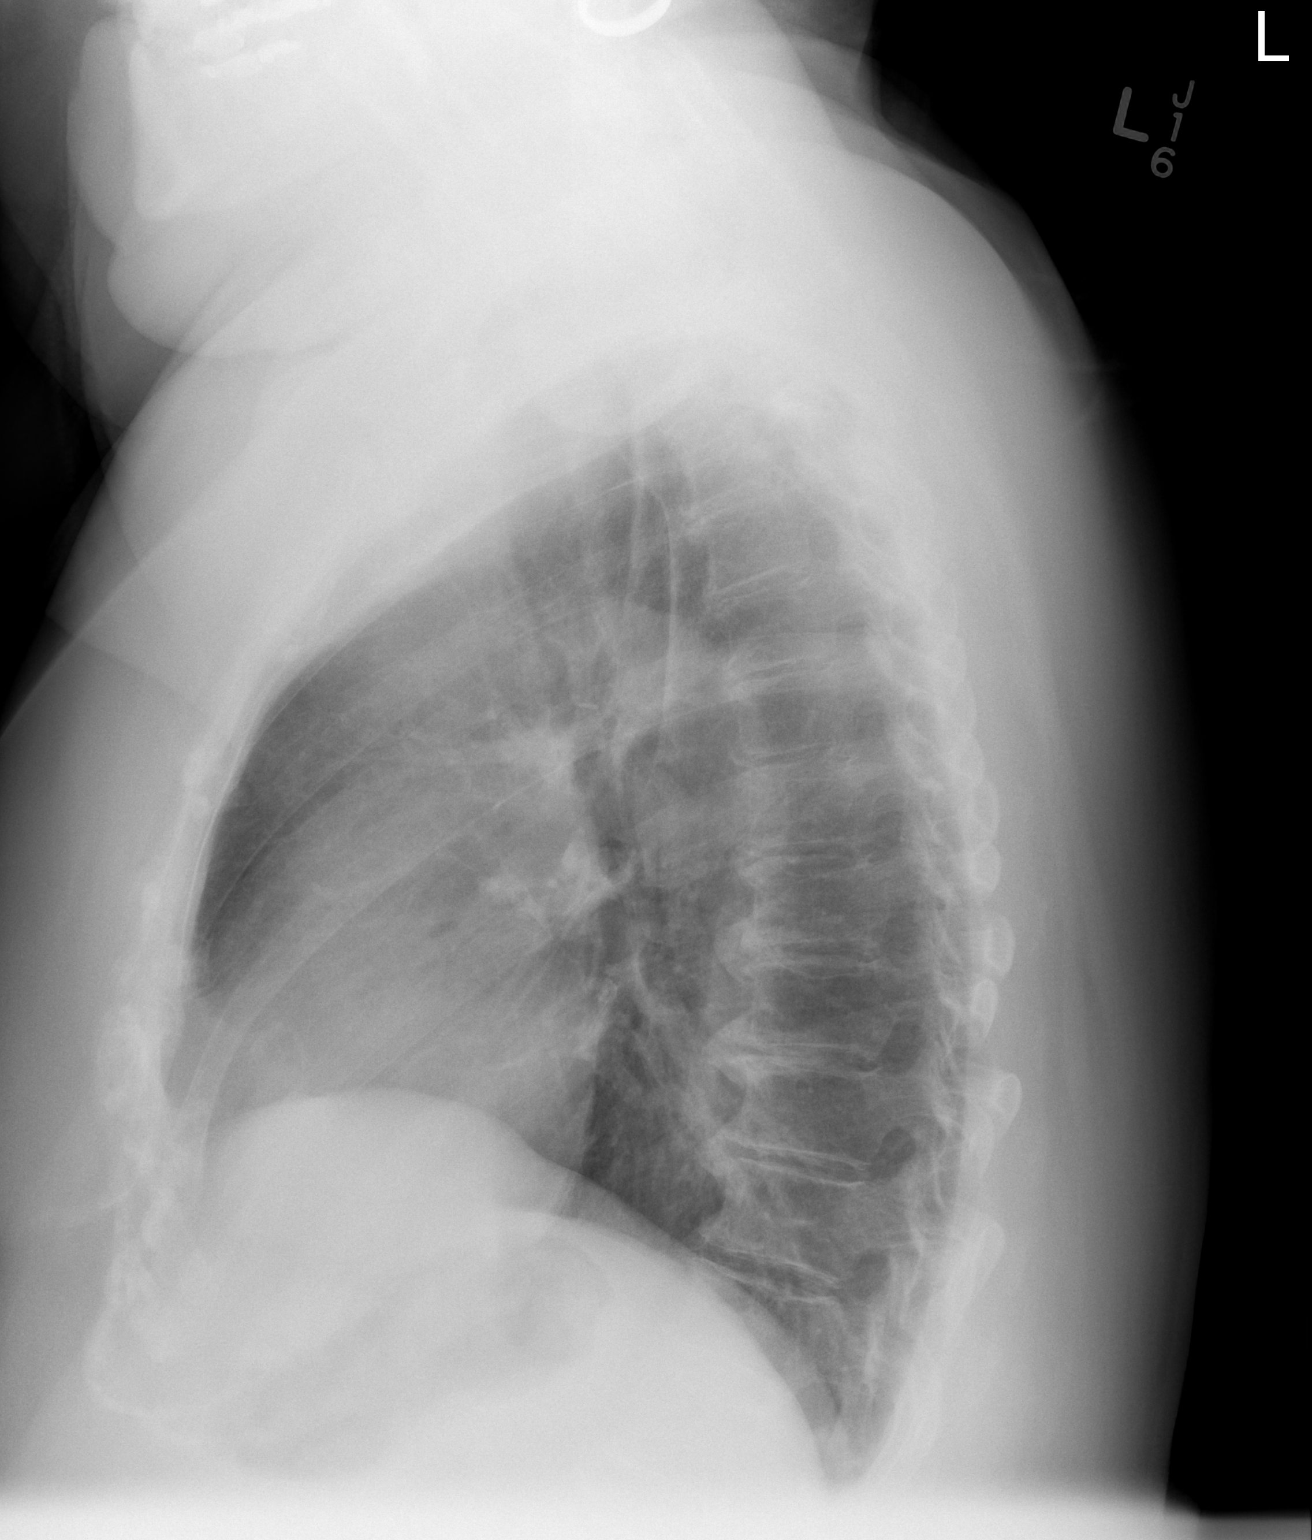

[2 of 2 positions shown; findings below may reference images not displayed]

FINDINGS: Mild peribronchial thickening. Heart and mediastinal contours are
within normal limits. No focal opacities or effusions. No acute bony
abnormality. Degenerative changes in the shoulders and thoracic
spine
IMPRESSION: Mild bronchitic changes.

## 2017-09-19 ENCOUNTER — Encounter: Payer: Self-pay | Admitting: Physical Therapy

## 2017-09-19 ENCOUNTER — Ambulatory Visit: Payer: Medicare Other | Admitting: Physical Therapy

## 2017-09-19 DIAGNOSIS — R262 Difficulty in walking, not elsewhere classified: Secondary | ICD-10-CM

## 2017-09-19 DIAGNOSIS — M6281 Muscle weakness (generalized): Secondary | ICD-10-CM

## 2017-09-19 DIAGNOSIS — R293 Abnormal posture: Secondary | ICD-10-CM

## 2017-09-19 DIAGNOSIS — M545 Low back pain: Secondary | ICD-10-CM

## 2017-09-19 DIAGNOSIS — R2681 Unsteadiness on feet: Secondary | ICD-10-CM | POA: Diagnosis not present

## 2017-09-19 NOTE — Therapy (Signed)
[facility] [address]  [place], [place], [address] Phone: [phone]   Fax:  [phone]  Physical Therapy Treatment  Patient Details  Name: Amanda Copeland MRN: [id number] Date of Birth: [birth year] Referring Provider: Alonza Bogus, MD   Encounter Date: 09/19/2017  PT End of Session - 09/19/17 0939    Visit Number  15    Number of Visits  20    Date for PT Re-Evaluation  10/11/17    Authorization Type  Medicare & BCBS    PT Start Time  0931    PT Stop Time  1012    PT Time Calculation (min)  41 min    Equipment Utilized During Treatment  Gait belt    Activity Tolerance  Patient tolerated treatment well    Behavior During Therapy  [facility] for tasks assessed/performed       Past Medical History:  Diagnosis Date  . Arthritis   . Asthma    childhood now returning  . Back pain affecting pregnancy 11/02/2014  . Benign essential tremor 10/28/2014  . Bilateral dry eyes   . Breast cancer ([place])    right  . Cancer (HCC)    Breast  . Dry eyes   . Encounter for Medicare annual wellness exam 05/22/2016  . Essential hypertension, benign 10/28/2014  . H/O measles   . H/O mumps   . History of chicken pox   . Lumbago 11/02/2014  . Mixed hyperlipidemia 10/28/2014  . Obesity 10/28/2014    Past Surgical History:  Procedure Laterality Date  . BACK SURGERY    . BREAST REDUCTION SURGERY Left   . CHOLECYSTECTOMY    . EYE SURGERY  2008   b/l cataracts removed, in [place]  . MASTECTOMY Right   . REDUCTION MAMMAPLASTY    . REPLACEMENT TOTAL KNEE BILATERAL Bilateral   . ROTATOR CUFF REPAIR Left     There were no vitals filed for this visit.  Subjective Assessment - 09/19/17 0935    Subjective  Pt notes no new concerns today. States increased fatigue from increased activity at the pool yesterday with her grandsons.     Patient Stated Goals  "get moving - build up some strength and feel steadier on my feet"    Currently in  Pain?  No/denies    Multiple Pain Sites  No         [facility] PT Assessment - 09/19/17 0001      Assessment   Next MD Visit  Aug 2020                   [facility] Adult PT Treatment/Exercise - 09/19/17 0001      Exercises   Exercises  Knee/Hip;Lumbar      Lumbar Exercises: Supine   Dead Bug  10 reps    Dead Bug Limitations  On each side, alternating LE/UE, starting from LEs resting in hooklying position      Knee/Hip Exercises: Aerobic   Nustep  L5 x 6 min (LE only)      Knee/Hip Exercises: Machines for Strengthening   Cybex Knee Extension  Bilat; 12 reps; 10#    Cybex Knee Flexion  Bilat; 12 reps; 15#    Other Machine  Plantarflexion seated on [place]; 2 x 15 reps; 15#. Pt had trouble getting ROM to engage weight but was able to perform motion without toe pain secondary to toe deformities      Knee/Hip Exercises: Seated  Sit to Sand  10 reps   holding 1000g medicine ball with bil UEs     Knee/Hip Exercises: Supine   Bridges  Both;10 reps      Ankle Exercises: Seated   Heel Raises  15 reps;Both          Balance Exercises - 09/19/17 1213      Balance Exercises: Standing   Other Standing Exercises  8 dots placed on floor appropriate step length distance apart with balance bubbles between each dot. Pt instructed to step on each dot and step over balance bubble with each LE.  8 reps over length of course with VC and TC from therapist to adequately shift weight to stance limb to allow for increased clearance with stepping limb. initially pt demonstrated difficulty with control of activity but improved with each rep           PT Short Term Goals - 08/21/17 0935      PT SHORT TERM GOAL #1   Title  Independent with initial HEP ([place] program at supported level as inidicated)    Status  Achieved      PT SHORT TERM GOAL #2   Title  Increase overall LE strength by 1/2 - 1 grade for improved stability    Status  Achieved      PT SHORT TERM GOAL #3   Title   Increase Berg to 38/56 to reduce fall risk    Status  Achieved        PT Long Term Goals - 08/30/17 1206      PT LONG TERM GOAL #1   Title  Independent with advanced/ongoing HEP    Status  On-going    Target Date  10/11/17      PT LONG TERM GOAL #2   Title  Increase overall LE strength to >/= 4-/5 to 4/5 for improved stability and activity tolerance    Status  On-going    Target Date  10/11/17      PT LONG TERM GOAL #3   Title  Increase Berg to 46/56 to reduce risk for falls    Status  On-going    Target Date  10/11/17      PT LONG TERM GOAL #4   Title  Decrease TUG to </=13.5 sec to increase safety with transitions    Time  --   14.63   Status  On-going    Target Date  10/11/17      PT LONG TERM GOAL #5   Title  Increase FGA to >/= 19/30 to decrease risk for falls with community ambulation    Baseline  --   13   Status  On-going    Target Date  10/11/17            Plan - 09/19/17 1219    Clinical Impression Statement  Eddye doing well today with improved tolerance for strengthening activities. Pt able to perform bridges today with appropriate form and less cueing form therapist for use of glute musculature. She continues to demonstrate difficulty with heel raise activities secondary to strength and discomfort in the toes with activity. She continues to demonstrate deficits with balance ability and single limb stance activities, which prevent her from being able to clear obstacles that require her to spend more than a few seconds on the stance limb. She will continue to benefit from physical therapy to address these impairments and progress toward remaining functional goals.     Rehab Potential  Good  PT Treatment/Interventions  Patient/family education;ADLs/Self Care Home Management;Neuromuscular re-education;Therapeutic exercise;Therapeutic activities;Functional mobility training;Gait training;Stair training;Manual techniques;Vestibular    Consulted and Agree with  Plan of Care  Patient       Patient will benefit from skilled therapeutic intervention in order to improve the following deficits and impairments:  Decreased balance, Decreased strength, Decreased coordination, Decreased activity tolerance, Difficulty walking, Abnormal gait, Decreased mobility, Decreased endurance  Visit Diagnosis: Unsteadiness on feet  Difficulty in walking, not elsewhere classified  Muscle weakness (generalized)  Bilateral low back pain, unspecified chronicity, with sciatica presence unspecified  Abnormal posture     Problem List Patient Active Problem List   Diagnosis Date Noted  . Fatigue 07/12/2017  . Morbid obesity ([place]) 03/05/2017  . Chronic diastolic heart failure ([place]) 11/29/2016  . Dyspnea on exertion 10/06/2016  . Cough variant asthma  vs UACS 10/06/2016  . Encounter for Medicare annual wellness exam 05/22/2016  . Hyperglycemia 08/07/2015  . Allergic 08/07/2015  . Gout 08/07/2015  . Leukocytosis 08/07/2015  . Lower abdominal pain 04/21/2015  . Back pain 11/02/2014  . Esophageal reflux 10/28/2014  . Essential hypertension, benign 10/28/2014  . Mixed hyperlipidemia 10/28/2014  . Benign essential tremor 10/28/2014  . Bilateral dry eyes   . Asthma   . Arthritis   . Cancer ([place])   . History of chicken pox     Shirline Frees, SPT  09/19/2017, 12:25 PM  [facility] [address]  [place], [place], [address] Phone: [phone]   Fax:  [phone]  Name: Raylan Troiani MRN: [id number] Date of Birth: 06/17/1940

## 2017-09-21 ENCOUNTER — Ambulatory Visit: Payer: Medicare Other | Admitting: Physical Therapy

## 2017-09-25 ENCOUNTER — Ambulatory Visit: Payer: Medicare Other | Admitting: Physical Therapy

## 2017-09-25 ENCOUNTER — Encounter: Payer: Self-pay | Admitting: Physical Therapy

## 2017-09-25 DIAGNOSIS — R262 Difficulty in walking, not elsewhere classified: Secondary | ICD-10-CM

## 2017-09-25 DIAGNOSIS — M545 Low back pain: Secondary | ICD-10-CM | POA: Diagnosis not present

## 2017-09-25 DIAGNOSIS — R2681 Unsteadiness on feet: Secondary | ICD-10-CM | POA: Diagnosis not present

## 2017-09-25 DIAGNOSIS — M6281 Muscle weakness (generalized): Secondary | ICD-10-CM | POA: Diagnosis not present

## 2017-09-25 DIAGNOSIS — R293 Abnormal posture: Secondary | ICD-10-CM | POA: Diagnosis not present

## 2017-09-25 NOTE — Therapy (Signed)
[facility] [address]  [place], [place], [address] Phone: [phone]   Fax:  [phone]  Physical Therapy Treatment  Patient Details  Name: Amanda Copeland MRN: [id number] Date of Birth: 1940-08-23 Referring Provider: Alonza Bogus, MD   Encounter Date: 09/25/2017  PT End of Session - 09/25/17 0800    Visit Number  16    Number of Visits  20    Date for PT Re-Evaluation  10/11/17    Authorization Type  Medicare & BCBS    PT Start Time  0800    PT Stop Time  0843    PT Time Calculation (min)  43 min    Equipment Utilized During Treatment  --    Activity Tolerance  Patient tolerated treatment well    Behavior During Therapy  [facility] for tasks assessed/performed       Past Medical History:  Diagnosis Date  . Arthritis   . Asthma    childhood now returning  . Back pain affecting pregnancy 11/02/2014  . Benign essential tremor 10/28/2014  . Bilateral dry eyes   . Breast cancer ([place])    right  . Cancer (HCC)    Breast  . Dry eyes   . Encounter for Medicare annual wellness exam 05/22/2016  . Essential hypertension, benign 10/28/2014  . H/O measles   . H/O mumps   . History of chicken pox   . Lumbago 11/02/2014  . Mixed hyperlipidemia 10/28/2014  . Obesity 10/28/2014    Past Surgical History:  Procedure Laterality Date  . BACK SURGERY    . BREAST REDUCTION SURGERY Left   . CHOLECYSTECTOMY    . EYE SURGERY  2008   b/l cataracts removed, in [place]  . MASTECTOMY Right   . REDUCTION MAMMAPLASTY    . REPLACEMENT TOTAL KNEE BILATERAL Bilateral   . ROTATOR CUFF REPAIR Left     There were no vitals filed for this visit.  Subjective Assessment - 09/25/17 0800    Subjective  Pt noting improving standing/walking tolerance but still notes fatigue in legs with increased activity and finds her self limited by SOB from her asthma.    Patient Stated Goals  "get moving - build up some strength and feel steadier on my  feet"    Currently in Pain?  No/denies                       [facility] Adult PT Treatment/Exercise - 09/25/17 0800      Exercises   Exercises  Knee/Hip      Knee/Hip Exercises: Aerobic   Nustep  L5 x 6 min (LE only)      Knee/Hip Exercises: Standing   Hip Abduction  Both;10 reps;Stengthening    Abduction Limitations  B side-stepping with yellow TB at midfoot    Other Standing Knee Exercises  Alt toe clears to 9" step - 2# on ankles x20 (1 pole A); Alt toe clears from Airex pad to 9" step - 2# on ankles x10 (1 pole A)    Other Standing Knee Exercises  R/L Fitter hip extension - 2 blue - x10, 2 pole A      Knee/Hip Exercises: Seated   Other Seated Knee/Hip Exercises  B Fitter Leg Press - 1 black x20             PT Education - 09/25/17 0846    Education Details  Side stepping with yellow TB added  to HEP    Person(s) Educated  Patient    Methods  Explanation;Demonstration;Handout    Comprehension  Verbalized understanding;Returned demonstration       PT Short Term Goals - 08/21/17 0935      PT SHORT TERM GOAL #1   Title  Independent with initial HEP ([place] program at supported level as inidicated)    Status  Achieved      PT SHORT TERM GOAL #2   Title  Increase overall LE strength by 1/2 - 1 grade for improved stability    Status  Achieved      PT SHORT TERM GOAL #3   Title  Increase Berg to 38/56 to reduce fall risk    Status  Achieved        PT Long Term Goals - 08/30/17 1206      PT LONG TERM GOAL #1   Title  Independent with advanced/ongoing HEP    Status  On-going    Target Date  10/11/17      PT LONG TERM GOAL #2   Title  Increase overall LE strength to >/= 4-/5 to 4/5 for improved stability and activity tolerance    Status  On-going    Target Date  10/11/17      PT LONG TERM GOAL #3   Title  Increase Berg to 46/56 to reduce risk for falls    Status  On-going    Target Date  10/11/17      PT LONG TERM GOAL #4   Title  Decrease TUG  to </=13.5 sec to increase safety with transitions    Time  --   14.63   Status  On-going    Target Date  10/11/17      PT LONG TERM GOAL #5   Title  Increase FGA to >/= 19/30 to decrease risk for falls with community ambulation    Baseline  --   13   Status  On-going    Target Date  10/11/17            Plan - 09/25/17 0807    Clinical Impression Statement  Treatment focusing on LE strengthening incorporating balance challenges with SLS and uneven surfaces - pt requiring some external suppport with 1-2 pole assist for some activities and continues to fatigue with standing exercises requiring intermittent seated rest breaks.    Rehab Potential  Good    PT Treatment/Interventions  Patient/family education;ADLs/Self Care Home Management;Neuromuscular re-education;Therapeutic exercise;Therapeutic activities;Functional mobility training;Gait training;Stair training;Manual techniques;Vestibular    Consulted and Agree with Plan of Care  Patient       Patient will benefit from skilled therapeutic intervention in order to improve the following deficits and impairments:  Decreased balance, Decreased strength, Decreased coordination, Decreased activity tolerance, Difficulty walking, Abnormal gait, Decreased mobility, Decreased endurance  Visit Diagnosis: Unsteadiness on feet  Difficulty in walking, not elsewhere classified  Muscle weakness (generalized)     Problem List Patient Active Problem List   Diagnosis Date Noted  . Fatigue 07/12/2017  . Morbid obesity ([place]) 03/05/2017  . Chronic diastolic heart failure ([place]) 11/29/2016  . Dyspnea on exertion 10/06/2016  . Cough variant asthma  vs UACS 10/06/2016  . Encounter for Medicare annual wellness exam 05/22/2016  . Hyperglycemia 08/07/2015  . Allergic 08/07/2015  . Gout 08/07/2015  . Leukocytosis 08/07/2015  . Lower abdominal pain 04/21/2015  . Back pain 11/02/2014  . Esophageal reflux 10/28/2014  . Essential hypertension,  benign 10/28/2014  . Mixed hyperlipidemia 10/28/2014  .  Benign essential tremor 10/28/2014  . Bilateral dry eyes   . Asthma   . Arthritis   . Cancer ([place])   . History of chicken pox     Percival Spanish, PT, MPT 09/25/2017, 8:52 AM  [facility] [address]  [place], [place], [address] Phone: [phone]   Fax:  [phone]  Name: Ladana Chavero MRN: [id number] Date of Birth: 02-Oct-1940

## 2017-09-28 ENCOUNTER — Ambulatory Visit: Payer: Medicare Other | Admitting: Physical Therapy

## 2017-09-28 DIAGNOSIS — R2681 Unsteadiness on feet: Secondary | ICD-10-CM

## 2017-09-28 DIAGNOSIS — R262 Difficulty in walking, not elsewhere classified: Secondary | ICD-10-CM | POA: Diagnosis not present

## 2017-09-28 DIAGNOSIS — M545 Low back pain: Secondary | ICD-10-CM | POA: Diagnosis not present

## 2017-09-28 DIAGNOSIS — M6281 Muscle weakness (generalized): Secondary | ICD-10-CM | POA: Diagnosis not present

## 2017-09-28 DIAGNOSIS — R293 Abnormal posture: Secondary | ICD-10-CM | POA: Diagnosis not present

## 2017-09-28 NOTE — Therapy (Signed)
[facility] [address]  [place], [place], [address] Phone: [phone]   Fax:  [phone]  Physical Therapy Treatment  Patient Details  Name: Amanda Copeland MRN: [id number] Date of Birth: 13-Sep-1940 Referring Provider: Alonza Bogus, MD   Encounter Date: 09/28/2017  PT End of Session - 09/28/17 1401    Visit Number  17    Number of Visits  20    Date for PT Re-Evaluation  10/11/17    Authorization Type  Medicare & BCBS    PT Start Time  1401    PT Stop Time  1444    PT Time Calculation (min)  43 min    Activity Tolerance  Patient tolerated treatment well    Behavior During Therapy  [facility] for tasks assessed/performed       Past Medical History:  Diagnosis Date  . Arthritis   . Asthma    childhood now returning  . Back pain affecting pregnancy 11/02/2014  . Benign essential tremor 10/28/2014  . Bilateral dry eyes   . Breast cancer ([place])    right  . Cancer (HCC)    Breast  . Dry eyes   . Encounter for Medicare annual wellness exam 05/22/2016  . Essential hypertension, benign 10/28/2014  . H/O measles   . H/O mumps   . History of chicken pox   . Lumbago 11/02/2014  . Mixed hyperlipidemia 10/28/2014  . Obesity 10/28/2014    Past Surgical History:  Procedure Laterality Date  . BACK SURGERY    . BREAST REDUCTION SURGERY Left   . CHOLECYSTECTOMY    . EYE SURGERY  2008   b/l cataracts removed, in [place]  . MASTECTOMY Right   . REDUCTION MAMMAPLASTY    . REPLACEMENT TOTAL KNEE BILATERAL Bilateral   . ROTATOR CUFF REPAIR Left     There were no vitals filed for this visit.  Subjective Assessment - 09/28/17 1404    Subjective  Pt doing well with no new complaints.    Patient Stated Goals  "get moving - build up some strength and feel steadier on my feet"    Currently in Pain?  No/denies                       [facility] Adult PT Treatment/Exercise - 09/28/17 1401      Exercises   Exercises   Knee/Hip      Lumbar Exercises: Seated   Other Seated Lumbar Exercises  B A/P Pallof Press & seated rows with red TB sitting on Dynadisc on mat table x15 each       Knee/Hip Exercises: Aerobic   Nustep  L6 x 6 (LE only)      Knee/Hip Exercises: Standing   Forward Lunges  Right;Left;10 reps;2 seconds    Forward Lunges Limitations  limited depth    Rebounder  Lateral, heel-toe & Bstaggered stance weight shfts x20 each; Marching in place & alt HS curls x20 each - intermittent UE support      Knee/Hip Exercises: Seated   Hamstring Curl  Right;Left;20 reps    Hamstring Limitations  red TB      Ankle Exercises: Seated   Heel Raises  Right;Left;20 reps   red TB over knees   Toe Raise  20 reps;2 seconds   red TB over toes              PT Short Term Goals - 08/21/17 0347  PT SHORT TERM GOAL #1   Title  Independent with initial HEP ([place] program at supported level as inidicated)    Status  Achieved      PT SHORT TERM GOAL #2   Title  Increase overall LE strength by 1/2 - 1 grade for improved stability    Status  Achieved      PT SHORT TERM GOAL #3   Title  Increase Berg to 38/56 to reduce fall risk    Status  Achieved        PT Long Term Goals - 08/30/17 1206      PT LONG TERM GOAL #1   Title  Independent with advanced/ongoing HEP    Status  On-going    Target Date  10/11/17      PT LONG TERM GOAL #2   Title  Increase overall LE strength to >/= 4-/5 to 4/5 for improved stability and activity tolerance    Status  On-going    Target Date  10/11/17      PT LONG TERM GOAL #3   Title  Increase Berg to 46/56 to reduce risk for falls    Status  On-going    Target Date  10/11/17      PT LONG TERM GOAL #4   Title  Decrease TUG to </=13.5 sec to increase safety with transitions    Time  --   14.63   Status  On-going    Target Date  10/11/17      PT LONG TERM GOAL #5   Title  Increase FGA to >/= 19/30 to decrease risk for falls with community ambulation     Baseline  --   13   Status  On-going    Target Date  10/11/17            Plan - 09/28/17 1407    Clinical Impression Statement  Amanda Copeland doing well today, stating her husband has noticed improvement in how she moves around. She continues to tolerate exercise progressing incorporating added resistance and unstable surfaces to facilitate strengthening, balance and proprioception, but continues to require frequent seated rest breaks due to fatigue. She will continue to benefit from skilled PT to [facility] strength and balance for improved activity tolerance and decreased fall risk.    Rehab Potential  Good    PT Treatment/Interventions  Patient/family education;ADLs/Self Care Home Management;Neuromuscular re-education;Therapeutic exercise;Therapeutic activities;Functional mobility training;Gait training;Stair training;Manual techniques;Vestibular    Consulted and Agree with Plan of Care  Patient       Patient will benefit from skilled therapeutic intervention in order to improve the following deficits and impairments:  Decreased balance, Decreased strength, Decreased coordination, Decreased activity tolerance, Difficulty walking, Abnormal gait, Decreased mobility, Decreased endurance  Visit Diagnosis: Unsteadiness on feet  Difficulty in walking, not elsewhere classified  Muscle weakness (generalized)     Problem List Patient Active Problem List   Diagnosis Date Noted  . Fatigue 07/12/2017  . Morbid obesity ([place]) 03/05/2017  . Chronic diastolic heart failure ([place]) 11/29/2016  . Dyspnea on exertion 10/06/2016  . Cough variant asthma  vs UACS 10/06/2016  . Encounter for Medicare annual wellness exam 05/22/2016  . Hyperglycemia 08/07/2015  . Allergic 08/07/2015  . Gout 08/07/2015  . Leukocytosis 08/07/2015  . Lower abdominal pain 04/21/2015  . Back pain 11/02/2014  . Esophageal reflux 10/28/2014  . Essential hypertension, benign 10/28/2014  . Mixed hyperlipidemia 10/28/2014  .  Benign essential tremor 10/28/2014  . Bilateral dry eyes   .  Asthma   . Arthritis   . Cancer ([place])   . History of chicken pox     Percival Spanish, PT, MPT 09/28/2017, 2:46 PM  [facility] [address]  [place], [place], [address] Phone: [phone]   Fax:  [phone]  Name: Amanda Copeland MRN: [id number] Date of Birth: [birth year]

## 2017-10-03 ENCOUNTER — Encounter: Payer: Self-pay | Admitting: Physical Therapy

## 2017-10-03 ENCOUNTER — Ambulatory Visit: Payer: Medicare Other | Admitting: Physical Therapy

## 2017-10-03 DIAGNOSIS — R2681 Unsteadiness on feet: Secondary | ICD-10-CM | POA: Diagnosis not present

## 2017-10-03 DIAGNOSIS — M545 Low back pain: Secondary | ICD-10-CM | POA: Diagnosis not present

## 2017-10-03 DIAGNOSIS — M6281 Muscle weakness (generalized): Secondary | ICD-10-CM

## 2017-10-03 DIAGNOSIS — R262 Difficulty in walking, not elsewhere classified: Secondary | ICD-10-CM | POA: Diagnosis not present

## 2017-10-03 DIAGNOSIS — R293 Abnormal posture: Secondary | ICD-10-CM | POA: Diagnosis not present

## 2017-10-03 NOTE — Therapy (Signed)
[facility] [address]  [place], [place], [address] Phone: [phone]   Fax:  [phone]  Physical Therapy Treatment  Patient Details  Name: Amanda Copeland MRN: [id number] Date of Birth: 1940-07-15 Referring Provider: Alonza Bogus, MD   Encounter Date: 10/03/2017  PT End of Session - 10/03/17 1315    Visit Number  18    Number of Visits  20    Date for PT Re-Evaluation  10/11/17    Authorization Type  Medicare & BCBS    PT Start Time  1315    PT Stop Time  1406    PT Time Calculation (min)  51 min    Activity Tolerance  Patient tolerated treatment well    Behavior During Therapy  [facility] for tasks assessed/performed       Past Medical History:  Diagnosis Date  . Arthritis   . Asthma    childhood now returning  . Back pain affecting pregnancy 11/02/2014  . Benign essential tremor 10/28/2014  . Bilateral dry eyes   . Breast cancer ([place])    right  . Cancer (HCC)    Breast  . Dry eyes   . Encounter for Medicare annual wellness exam 05/22/2016  . Essential hypertension, benign 10/28/2014  . H/O measles   . H/O mumps   . History of chicken pox   . Lumbago 11/02/2014  . Mixed hyperlipidemia 10/28/2014  . Obesity 10/28/2014    Past Surgical History:  Procedure Laterality Date  . BACK SURGERY    . BREAST REDUCTION SURGERY Left   . CHOLECYSTECTOMY    . EYE SURGERY  2008   b/l cataracts removed, in [place]  . MASTECTOMY Right   . REDUCTION MAMMAPLASTY    . REPLACEMENT TOTAL KNEE BILATERAL Bilateral   . ROTATOR CUFF REPAIR Left     There were no vitals filed for this visit.  Subjective Assessment - 10/03/17 1316    Subjective  Pt reports she "put her back out yesterday" - not sure how she did it but notes increased back pain today, esp with transitional movements.    Patient Stated Goals  "get moving - build up some strength and feel steadier on my feet"    Currently in Pain?  No/denies    Pain Score   0-No pain   up to 0/92 with certain movements   Pain Location  Back    Pain Orientation  Lower    Pain Descriptors / Indicators  Sharp    Pain Type  Acute pain;Chronic pain    Pain Onset  Yesterday    Pain Frequency  Intermittent    Aggravating Factors   transitional movements    Pain Relieving Factors  ice, Aspercreme cream or patch, ibuprofen    Effect of Pain on Daily Activities  slower moving                       [facility] Adult PT Treatment/Exercise - 10/03/17 1315      Exercises   Exercises  Knee/Hip;Lumbar      Lumbar Exercises: Stretches   Quadruped Mid Back Stretch  30 seconds;2 reps    Quadruped Mid Back Stretch Limitations  3 way seated prayer stretch with green Pball      Lumbar Exercises: Supine   Pelvic Tilt  10 reps;5 seconds      Knee/Hip Exercises: Aerobic   Nustep  L4 x 6 (LE only)  Modalities   Modalities  Electrical Stimulation;Cryotherapy      Cryotherapy   Number Minutes Cryotherapy  15 Minutes    Cryotherapy Location  Lumbar Spine    Type of Cryotherapy  Ice pack      Electrical Stimulation   Electrical Stimulation Location  Lumbar paraspinals & upper buttocks    Electrical Stimulation Action  IFC    Electrical Stimulation Parameters  80-150 Hz, intensity to pt tol x15'    Electrical Stimulation Goals  Pain;Tone      Manual Therapy   Manual Therapy  Soft tissue mobilization;Myofascial release;Taping    Soft tissue mobilization  L lumbar paraspinals & upper glutes    Myofascial Release  manual TPR L glute medius & lumbar paraspinals    Kinesiotex  Inhibit Muscle      Kinesiotix   Inhibit Muscle   B lumbar paraspinals - 30% from distal to ASIS to lower ribs               PT Short Term Goals - 08/21/17 0935      PT SHORT TERM GOAL #1   Title  Independent with initial HEP ([place] program at supported level as inidicated)    Status  Achieved      PT SHORT TERM GOAL #2   Title  Increase overall LE strength by 1/2 - 1  grade for improved stability    Status  Achieved      PT SHORT TERM GOAL #3   Title  Increase Berg to 38/56 to reduce fall risk    Status  Achieved        PT Long Term Goals - 08/30/17 1206      PT LONG TERM GOAL #1   Title  Independent with advanced/ongoing HEP    Status  On-going    Target Date  10/11/17      PT LONG TERM GOAL #2   Title  Increase overall LE strength to >/= 4-/5 to 4/5 for improved stability and activity tolerance    Status  On-going    Target Date  10/11/17      PT LONG TERM GOAL #3   Title  Increase Berg to 46/56 to reduce risk for falls    Status  On-going    Target Date  10/11/17      PT LONG TERM GOAL #4   Title  Decrease TUG to </=13.5 sec to increase safety with transitions    Time  --   14.63   Status  On-going    Target Date  10/11/17      PT LONG TERM GOAL #5   Title  Increase FGA to >/= 19/30 to decrease risk for falls with community ambulation    Baseline  --   13   Status  On-going    Target Date  10/11/17            Plan - 10/03/17 1321    Clinical Impression Statement  Marcie Bal arriving to PT reporting she "put her back out" yesterday, but uncertain how she did this - reports increased L buttock and low back pain. Pain limiting tolerance for transitional movements along with therapeutic exercises/activities, therefore attempted to address pain and asssociated areas of increased muscle tension with manual therapy including STM/MFR but very ttp with limited tolerance. Placed kinesiotape aloing paraspinals to promote decreased pain and muscle spasms, and concluded treatment with estim and ice for pain relief.    Rehab Potential  Good  PT Treatment/Interventions  Patient/family education;ADLs/Self Care Home Management;Neuromuscular re-education;Therapeutic exercise;Therapeutic activities;Functional mobility training;Gait training;Stair training;Manual techniques;Vestibular;Electrical Stimulation;Cryotherapy    Consulted and Agree with  Plan of Care  Patient       Patient will benefit from skilled therapeutic intervention in order to improve the following deficits and impairments:  Decreased balance, Decreased strength, Decreased coordination, Decreased activity tolerance, Difficulty walking, Abnormal gait, Decreased mobility, Decreased endurance  Visit Diagnosis: Unsteadiness on feet  Difficulty in walking, not elsewhere classified  Muscle weakness (generalized)     Problem List Patient Active Problem List   Diagnosis Date Noted  . Fatigue 07/12/2017  . Morbid obesity ([place]) 03/05/2017  . Chronic diastolic heart failure ([place]) 11/29/2016  . Dyspnea on exertion 10/06/2016  . Cough variant asthma  vs UACS 10/06/2016  . Encounter for Medicare annual wellness exam 05/22/2016  . Hyperglycemia 08/07/2015  . Allergic 08/07/2015  . Gout 08/07/2015  . Leukocytosis 08/07/2015  . Lower abdominal pain 04/21/2015  . Back pain 11/02/2014  . Esophageal reflux 10/28/2014  . Essential hypertension, benign 10/28/2014  . Mixed hyperlipidemia 10/28/2014  . Benign essential tremor 10/28/2014  . Bilateral dry eyes   . Asthma   . Arthritis   . Cancer ([place])   . History of chicken pox     Percival Spanish, PT, MPT 10/03/2017, 4:14 PM  [facility] [address]  [place], [place], [address] Phone: [phone]   Fax:  [phone]  Name: Shirlette Scarber MRN: [id number] Date of Birth: 1940-07-16

## 2017-10-05 ENCOUNTER — Ambulatory Visit: Payer: Medicare Other | Admitting: Physical Therapy

## 2017-10-05 DIAGNOSIS — R2681 Unsteadiness on feet: Secondary | ICD-10-CM | POA: Diagnosis not present

## 2017-10-05 DIAGNOSIS — M6281 Muscle weakness (generalized): Secondary | ICD-10-CM

## 2017-10-05 DIAGNOSIS — R262 Difficulty in walking, not elsewhere classified: Secondary | ICD-10-CM | POA: Diagnosis not present

## 2017-10-05 DIAGNOSIS — M545 Low back pain: Secondary | ICD-10-CM | POA: Diagnosis not present

## 2017-10-05 DIAGNOSIS — R293 Abnormal posture: Secondary | ICD-10-CM | POA: Diagnosis not present

## 2017-10-05 NOTE — Therapy (Signed)
[facility] [address]  [place], [place], [address] Phone: [phone]   Fax:  [phone]  Physical Therapy Treatment  Patient Details  Name: Amanda Copeland MRN: [id number] Date of Birth: February 20, 1940 Referring Provider: Alonza Bogus, MD   Encounter Date: 10/05/2017  PT End of Session - 10/05/17 1017    Visit Number  19    Number of Visits  20    Date for PT Re-Evaluation  10/11/17    Authorization Type  Medicare & BCBS    PT Start Time  1017    PT Stop Time  1109    PT Time Calculation (min)  52 min    Activity Tolerance  Patient tolerated treatment well    Behavior During Therapy  [facility] for tasks assessed/performed       Past Medical History:  Diagnosis Date  . Arthritis   . Asthma    childhood now returning  . Back pain affecting pregnancy 11/02/2014  . Benign essential tremor 10/28/2014  . Bilateral dry eyes   . Breast cancer ([place])    right  . Cancer (HCC)    Breast  . Dry eyes   . Encounter for Medicare annual wellness exam 05/22/2016  . Essential hypertension, benign 10/28/2014  . H/O measles   . H/O mumps   . History of chicken pox   . Lumbago 11/02/2014  . Mixed hyperlipidemia 10/28/2014  . Obesity 10/28/2014    Past Surgical History:  Procedure Laterality Date  . BACK SURGERY    . BREAST REDUCTION SURGERY Left   . CHOLECYSTECTOMY    . EYE SURGERY  2008   b/l cataracts removed, in [place]  . MASTECTOMY Right   . REDUCTION MAMMAPLASTY    . REPLACEMENT TOTAL KNEE BILATERAL Bilateral   . ROTATOR CUFF REPAIR Left     There were no vitals filed for this visit.  Subjective Assessment - 10/05/17 1018    Subjective  Pt reports her back was feeling better yesterday, but pain worse again today.    Patient Stated Goals  "get moving - build up some strength and feel steadier on my feet"    Currently in Pain?  Yes    Pain Score  0-No pain   up to 8-9/10 with transitions or initialtion of walking    Pain Location  Back    Pain Orientation  Lower    Pain Descriptors / Indicators  Sharp    Pain Type  Acute pain;Chronic pain    Pain Onset  In the past 7 days    Pain Frequency  Intermittent                       [facility] Adult PT Treatment/Exercise - 10/05/17 1017      Knee/Hip Exercises: Aerobic   Nustep  L4 x 6 (LE only)      Modalities   Modalities  Electrical Stimulation;Cryotherapy      Cryotherapy   Number Minutes Cryotherapy  15 Minutes    Cryotherapy Location  Lumbar Spine    Type of Cryotherapy  Ice pack      Electrical Stimulation   Electrical Stimulation Location  Lumbar paraspinals & upper buttocks    Electrical Stimulation Action  IFC    Electrical Stimulation Parameters  80-150 Hz, intensity to pt tol x 15 min    Electrical Stimulation Goals  Pain;Tone          Balance  Exercises - 10/05/17 1017      OTAGO PROGRAM   Back Extension  Standing;5 reps    Trunk Movements  Standing;5 reps    Ankle Movements  Standing;10 reps    Knee Flexor  10 reps   yellow TB at ankles   Hip ABductor  10 reps   yellow TB at ankles   Ankle Plantorflexors  20 reps, no support    Ankle Dorsiflexors  20 reps, no support    Knee Bends  10 reps, no support    Backwards Walking  Support    Walking and Turning Around  No assistive device    Sideways Walking  Assistive device   yellow TB at ankles, light UE support on counter   Tandem Stance  --   intermittent support up to 15 sec   Tandem Walk  Support    One Leg Stand  --   intermittent support x 20-30 sec   Heel Walking  Support    Toe Walk  Support    Heel Toe Walking Backward  --   Support         PT Short Term Goals - 08/21/17 0935      PT SHORT TERM GOAL #1   Title  Independent with initial HEP ([place] program at supported level as inidicated)    Status  Achieved      PT SHORT TERM GOAL #2   Title  Increase overall LE strength by 1/2 - 1 grade for improved stability    Status  Achieved       PT SHORT TERM GOAL #3   Title  Increase Berg to 38/56 to reduce fall risk    Status  Achieved        PT Long Term Goals - 08/30/17 1206      PT LONG TERM GOAL #1   Title  Independent with advanced/ongoing HEP    Status  On-going    Target Date  10/11/17      PT LONG TERM GOAL #2   Title  Increase overall LE strength to >/= 4-/5 to 4/5 for improved stability and activity tolerance    Status  On-going    Target Date  10/11/17      PT LONG TERM GOAL #3   Title  Increase Berg to 46/56 to reduce risk for falls    Status  On-going    Target Date  10/11/17      PT LONG TERM GOAL #4   Title  Decrease TUG to </=13.5 sec to increase safety with transitions    Time  --   14.63   Status  On-going    Target Date  10/11/17      PT LONG TERM GOAL #5   Title  Increase FGA to >/= 19/30 to decrease risk for falls with community ambulation    Baseline  --   13   Status  On-going    Target Date  10/11/17            Plan - 10/05/17 1020    Clinical Impression Statement  Amanda Copeland reporting improvement in LBP yesterday, but stating it is more intense this morning - still only an issue during transitional movements and with initial few steps of walkin after period of inactivity. Pt with only 2 visits remaining in current POC, therefore focused on review/progression of HEP based primarily on the [place] fall prevent program. Pt requiring decreasing support but still challenged by SLS and tandem activities  as well as heel & toe walking. Discussed wasy to progress activities by increasing reps, extending times or adding resistance such as cuff weights or yellow TB. Also encouraged pt to look into community exercise programs geared toward seniors (handout previously provided) as pt states that now that pool season is coming to an end that she tends to become very inactive. Anticipate transition to HEP on next visit with pt in agreement with this plan. Session conlcuded with estim and ice pack for LBP as  pt noting benefit from this last session.    Rehab Potential  Good    PT Treatment/Interventions  Patient/family education;ADLs/Self Care Home Management;Neuromuscular re-education;Therapeutic exercise;Therapeutic activities;Functional mobility training;Gait training;Stair training;Manual techniques;Vestibular;Electrical Stimulation;Cryotherapy    Consulted and Agree with Plan of Care  Patient       Patient will benefit from skilled therapeutic intervention in order to improve the following deficits and impairments:  Decreased balance, Decreased strength, Decreased coordination, Decreased activity tolerance, Difficulty walking, Abnormal gait, Decreased mobility, Decreased endurance  Visit Diagnosis: Unsteadiness on feet  Difficulty in walking, not elsewhere classified  Muscle weakness (generalized)     Problem List Patient Active Problem List   Diagnosis Date Noted  . Fatigue 07/12/2017  . Morbid obesity ([place]) 03/05/2017  . Chronic diastolic heart failure ([place]) 11/29/2016  . Dyspnea on exertion 10/06/2016  . Cough variant asthma  vs UACS 10/06/2016  . Encounter for Medicare annual wellness exam 05/22/2016  . Hyperglycemia 08/07/2015  . Allergic 08/07/2015  . Gout 08/07/2015  . Leukocytosis 08/07/2015  . Lower abdominal pain 04/21/2015  . Back pain 11/02/2014  . Esophageal reflux 10/28/2014  . Essential hypertension, benign 10/28/2014  . Mixed hyperlipidemia 10/28/2014  . Benign essential tremor 10/28/2014  . Bilateral dry eyes   . Asthma   . Arthritis   . Cancer ([place])   . History of chicken pox     Percival Spanish, PT, MPT 10/05/2017, 11:12 AM  [facility] [address]  [place], [place], [address] Phone: [phone]   Fax:  [phone]  Name: Amanda Copeland MRN: [id number] Date of Birth: 08-18-1940

## 2017-10-10 ENCOUNTER — Ambulatory Visit: Payer: Medicare Other | Attending: Neurology | Admitting: Physical Therapy

## 2017-10-10 DIAGNOSIS — M6281 Muscle weakness (generalized): Secondary | ICD-10-CM | POA: Diagnosis not present

## 2017-10-10 DIAGNOSIS — R2681 Unsteadiness on feet: Secondary | ICD-10-CM | POA: Insufficient documentation

## 2017-10-10 DIAGNOSIS — R262 Difficulty in walking, not elsewhere classified: Secondary | ICD-10-CM | POA: Insufficient documentation

## 2017-10-10 NOTE — Therapy (Signed)
[facility] [address]  [place], [place], [address] Phone: [phone]   Fax:  [phone]  Physical Therapy Treatment  Patient Details  Name: Amanda Copeland MRN: [id number] Date of Birth: December 05, 1940 Referring Provider: Alonza Bogus, MD   Encounter Date: 10/10/2017  PT End of Session - 10/10/17 1310    Visit Number  19    Number of Visits  20    Date for PT Re-Evaluation  10/11/17    Authorization Type  Medicare & BCBS    PT Start Time  1310    PT Stop Time  1352    PT Time Calculation (min)  42 min    Activity Tolerance  Patient tolerated treatment well    Behavior During Therapy  [facility] for tasks assessed/performed       Past Medical History:  Diagnosis Date  . Arthritis   . Asthma    childhood now returning  . Back pain affecting pregnancy 11/02/2014  . Benign essential tremor 10/28/2014  . Bilateral dry eyes   . Breast cancer ([place])    right  . Cancer (HCC)    Breast  . Dry eyes   . Encounter for Medicare annual wellness exam 05/22/2016  . Essential hypertension, benign 10/28/2014  . H/O measles   . H/O mumps   . History of chicken pox   . Lumbago 11/02/2014  . Mixed hyperlipidemia 10/28/2014  . Obesity 10/28/2014    Past Surgical History:  Procedure Laterality Date  . BACK SURGERY    . BREAST REDUCTION SURGERY Left   . CHOLECYSTECTOMY    . EYE SURGERY  2008   b/l cataracts removed, in [place]  . MASTECTOMY Right   . REDUCTION MAMMAPLASTY    . REPLACEMENT TOTAL KNEE BILATERAL Bilateral   . ROTATOR CUFF REPAIR Left     There were no vitals filed for this visit.  Subjective Assessment - 10/10/17 1312    Subjective  Pt reports her back is getting better - no pain yesterday but woke up with a little pain today, [facility] applied Aspercreme today.    Patient Stated Goals  "get moving - build up some strength and feel steadier on my feet"    Currently in Pain?  No/denies    Pain Score  0-No pain     Pain Onset  In the past 7 days         [facility] PT Assessment - 10/10/17 1310      Assessment   Medical Diagnosis  Gait instability d/t benign essential tremor    Referring Provider  Alonza Bogus, MD    Onset Date/Surgical Date  --   ~5-6 yrs since tremor diagnosed   Next MD Visit  Aug 2020      Strength   Right Hip Flexion  4+/5    Right Hip Extension  4/5    Right Hip External Rotation   4-/5    Right Hip Internal Rotation  4/5    Right Hip ABduction  4/5    Right Hip ADduction  4-/5    Left Hip Flexion  4+/5    Left Hip Extension  4/5    Left Hip External Rotation  4-/5    Left Hip Internal Rotation  4/5    Left Hip ABduction  4/5    Left Hip ADduction  4-/5    Right Knee Flexion  4+/5    Right Knee Extension  4+/5  Left Knee Flexion  4+/5    Left Knee Extension  4+/5    Right Ankle Dorsiflexion  4+/5    Right Ankle Plantar Flexion  4/5    Left Ankle Dorsiflexion  4+/5    Left Ankle Plantar Flexion  3/5      Ambulation/Gait   Gait velocity  2.84 ft/sec      Standardized Balance Assessment   Standardized Balance Assessment  Berg Balance Test;Timed Up and Go Test;10 meter walk test    10 Meter Walk  11.53 sec      Berg Balance Test   Sit to Stand  Able to stand without using hands and stabilize independently    Standing Unsupported  Able to stand safely 2 minutes    Sitting with Back Unsupported but Feet Supported on Floor or Stool  Able to sit safely and securely 2 minutes    Stand to Sit  Sits safely with minimal use of hands    Transfers  Able to transfer safely, minor use of hands    Standing Unsupported with Eyes Closed  Able to stand 10 seconds safely    Standing Ubsupported with Feet Together  Able to place feet together independently and stand for 1 minute with supervision   narrow BOS but unable to get feet together due to leg girth   From Standing, Reach Forward with Outstretched Arm  Can reach confidently >25 cm (10")    From Standing Position, Pick  up Object from Floor  Able to pick up shoe safely and easily    From Standing Position, Turn to Look Behind Over each Shoulder  Looks behind one side only/other side shows less weight shift    Turn 360 Degrees  Able to turn 360 degrees safely one side only in 4 seconds or less    Standing Unsupported, Alternately Place Feet on Step/Stool  Able to stand independently and complete 8 steps >20 seconds    Standing Unsupported, One Foot in Front  Able to plae foot ahead of the other independently and hold 30 seconds    Standing on One Leg  Tries to lift leg/unable to hold 3 seconds but remains standing independently    Total Score  48    Berg comment:  46-51 moderate fall risk (>50%)       Timed Up and Go Test   Normal TUG (seconds)  12.25      Functional Gait  Assessment   Gait Level Surface  Walks 20 ft in less than 7 sec but greater than 5.5 sec, uses assistive device, slower speed, mild gait deviations, or deviates 6-10 in outside of the 12 in walkway width.    Change in Gait Speed  Able to smoothly change walking speed without loss of balance or gait deviation. Deviate no more than 6 in outside of the 12 in walkway width.    Gait with Horizontal Head Turns  Performs head turns smoothly with no change in gait. Deviates no more than 6 in outside 12 in walkway width    Gait with Vertical Head Turns  Performs head turns with no change in gait. Deviates no more than 6 in outside 12 in walkway width.    Gait and Pivot Turn  Pivot turns safely within 3 sec and stops quickly with no loss of balance.    Step Over Obstacle  Is able to step over one shoe box (4.5 in total height) without changing gait speed. No evidence of imbalance.  Gait with Narrow Base of Support  Ambulates less than 4 steps heel to toe or cannot perform without assistance.    Gait with Eyes Closed  Walks 20 ft, slow speed, abnormal gait pattern, evidence for imbalance, deviates 10-15 in outside 12 in walkway width. Requires more than  9 sec to ambulate 20 ft.    Ambulating Backwards  Walks 20 ft, uses assistive device, slower speed, mild gait deviations, deviates 6-10 in outside 12 in walkway width.    Steps  Two feet to a stair, must use rail.    Total Score  20                   [facility] Adult PT Treatment/Exercise - 10/10/17 1310      Exercises   Exercises  Knee/Hip;Lumbar      Knee/Hip Exercises: Aerobic   Nustep  L5 x 6 (LE only)               PT Short Term Goals - 08/21/17 0935      PT SHORT TERM GOAL #1   Title  Independent with initial HEP ([facility] program at supported level as inidicated)    Status  Achieved      PT SHORT TERM GOAL #2   Title  Increase overall LE strength by 1/2 - 1 grade for improved stability    Status  Achieved      PT SHORT TERM GOAL #3   Title  Increase Berg to 38/56 to reduce fall risk    Status  Achieved        PT Long Term Goals - 10/10/17 1316      PT LONG TERM GOAL #1   Title  Independent with advanced/ongoing HEP    Status  Achieved      PT LONG TERM GOAL #2   Title  Increase overall LE strength to >/= 4-/5 to 4/5 for improved stability and activity tolerance    Status  Achieved   exception L foot PF     PT LONG TERM GOAL #3   Title  Increase Berg to 46/56 to reduce risk for falls    Status  Achieved      PT LONG TERM GOAL #4   Title  Decrease TUG to </=13.5 sec to increase safety with transitions    Time  --   14.63   Status  Achieved      PT LONG TERM GOAL #5   Title  Increase FGA to >/= 19/30 to decrease risk for falls with community ambulation    Baseline  --   13   Status  Achieved            Plan - 10/10/17 1314    Clinical Impression Statement  Kenzlee has demonstated excellent progress with PT with improved gait speed and stability, as well as decreased reliance on use of SPC with community ambulation except with stairs. Overall LE strength improved with mild weakness still present in hip adduction & ER and moderate  weakness in L PF, however pt aware of appropriate HEP exercises to target these motions. Balance has significantly improved as evidenced by increase in Berg score from 31/56 to 48/56, decrease in TUG time by ~7 sec from 19.19 sec to 12.25 sec, and increase in FGA score from 4/30 to 20/30; all indicating a reduced fall risk. All LTGs met except L ankle PF strength and pt aware of need to continue with HEP to prevent future decline in  function, therefore will proceed with discharge from PT for this episode.    Rehab Potential  Good    PT Treatment/Interventions  Patient/family education;ADLs/Self Care Home Management;Neuromuscular re-education;Therapeutic exercise;Therapeutic activities;Functional mobility training;Gait training;Stair training;Manual techniques;Vestibular;Electrical Stimulation;Cryotherapy    PT Next Visit Plan  Discharge    Consulted and Agree with Plan of Care  Patient       Patient will benefit from skilled therapeutic intervention in order to improve the following deficits and impairments:  Decreased balance, Decreased strength, Decreased coordination, Decreased activity tolerance, Difficulty walking, Abnormal gait, Decreased mobility, Decreased endurance  Visit Diagnosis: Unsteadiness on feet  Difficulty in walking, not elsewhere classified  Muscle weakness (generalized)     Problem List Patient Active Problem List   Diagnosis Date Noted  . Fatigue 07/12/2017  . Morbid obesity ([place]) 03/05/2017  . Chronic diastolic heart failure ([place]) 11/29/2016  . Dyspnea on exertion 10/06/2016  . Cough variant asthma  vs UACS 10/06/2016  . Encounter for Medicare annual wellness exam 05/22/2016  . Hyperglycemia 08/07/2015  . Allergic 08/07/2015  . Gout 08/07/2015  . Leukocytosis 08/07/2015  . Lower abdominal pain 04/21/2015  . Back pain 11/02/2014  . Esophageal reflux 10/28/2014  . Essential hypertension, benign 10/28/2014  . Mixed hyperlipidemia 10/28/2014  . Benign  essential tremor 10/28/2014  . Bilateral dry eyes   . Asthma   . Arthritis   . Cancer ([place])   . History of chicken pox    PHYSICAL THERAPY DISCHARGE SUMMARY  Visits from Start of Care: 19  Current functional level related to goals / functional outcomes:   Refer to above clinical impression.   Remaining deficits:   As above.   Education / Equipment:   [employer] + customized HEP  Plan: Patient agrees to discharge.  Patient goals were not met. Patient is being discharged due to meeting the stated rehab goals.  ?????      Percival Spanish, PT, MPT 10/10/2017, 6:49 PM  [facility] [address]  [place], [place], [address] Phone: [phone]   Fax:  [phone]  Name: Sister Carbone MRN: [id number] Date of Birth: 18-Dec-1940

## 2017-10-25 ENCOUNTER — Encounter: Payer: Self-pay | Admitting: Family Medicine

## 2017-10-25 ENCOUNTER — Other Ambulatory Visit: Payer: Self-pay | Admitting: Family Medicine

## 2017-10-25 MED ORDER — ALLOPURINOL 100 MG PO TABS: 100.0000 mg | ORAL_TABLET | Freq: Every day | ORAL | 1 refills | Status: DC

## 2017-10-25 MED ORDER — COLCHICINE 0.6 MG PO TABS: ORAL_TABLET | ORAL | 1 refills | Status: DC

## 2017-10-29 ENCOUNTER — Other Ambulatory Visit: Payer: Self-pay | Admitting: Family Medicine

## 2017-10-29 DIAGNOSIS — J4521 Mild intermittent asthma with (acute) exacerbation: Secondary | ICD-10-CM

## 2017-11-14 ENCOUNTER — Other Ambulatory Visit: Payer: Self-pay | Admitting: Family Medicine

## 2017-11-14 DIAGNOSIS — J4521 Mild intermittent asthma with (acute) exacerbation: Secondary | ICD-10-CM

## 2017-11-17 ENCOUNTER — Other Ambulatory Visit: Payer: Self-pay | Admitting: Family Medicine

## 2017-12-04 ENCOUNTER — Ambulatory Visit: Payer: Medicare Other | Admitting: Family Medicine

## 2017-12-07 ENCOUNTER — Encounter: Payer: Self-pay | Admitting: Family Medicine

## 2017-12-07 ENCOUNTER — Ambulatory Visit (INDEPENDENT_AMBULATORY_CARE_PROVIDER_SITE_OTHER): Payer: Medicare Other | Admitting: Family Medicine

## 2017-12-07 VITALS — BP 142/70 | HR 67 | Temp 97.8°F | Resp 18 | Wt 241.8 lb

## 2017-12-07 DIAGNOSIS — M109 Gout, unspecified: Secondary | ICD-10-CM | POA: Diagnosis not present

## 2017-12-07 DIAGNOSIS — I10 Essential (primary) hypertension: Secondary | ICD-10-CM | POA: Diagnosis not present

## 2017-12-07 DIAGNOSIS — R739 Hyperglycemia, unspecified: Secondary | ICD-10-CM | POA: Diagnosis not present

## 2017-12-07 DIAGNOSIS — E782 Mixed hyperlipidemia: Secondary | ICD-10-CM | POA: Diagnosis not present

## 2017-12-07 DIAGNOSIS — R109 Unspecified abdominal pain: Secondary | ICD-10-CM

## 2017-12-07 LAB — URINALYSIS, ROUTINE W REFLEX MICROSCOPIC
BILIRUBIN URINE: NEGATIVE
Hgb urine dipstick: NEGATIVE
KETONES UR: NEGATIVE
NITRITE: NEGATIVE
PH: 5.5 (ref 5.0–8.0)
Specific Gravity, Urine: 1.015 (ref 1.000–1.030)
Total Protein, Urine: NEGATIVE
Urine Glucose: NEGATIVE
Urobilinogen, UA: 0.2 (ref 0.0–1.0)

## 2017-12-07 LAB — COMPREHENSIVE METABOLIC PANEL
ALT: 11 U/L (ref 0–35)
AST: 16 U/L (ref 0–37)
Albumin: 4 g/dL (ref 3.5–5.2)
Alkaline Phosphatase: 85 U/L (ref 39–117)
BUN: 18 mg/dL (ref 6–23)
CHLORIDE: 103 meq/L (ref 96–112)
CO2: 30 mEq/L (ref 19–32)
Calcium: 9.5 mg/dL (ref 8.4–10.5)
Creatinine, Ser: 0.89 mg/dL (ref 0.40–1.20)
GFR: 65.2 mL/min (ref >60.00)
Glucose, Bld: 148 mg/dL — ABNORMAL HIGH (ref 70–99)
POTASSIUM: 3.9 meq/L (ref 3.5–5.1)
SODIUM: 142 meq/L (ref 135–145)
Total Bilirubin: 0.4 mg/dL (ref 0.2–1.2)
Total Protein: 6.5 g/dL (ref 6.0–8.3)

## 2017-12-07 LAB — CBC
HCT: 41.9 % (ref 36.0–46.0)
HEMOGLOBIN: 14.1 g/dL (ref 12.0–15.0)
MCHC: 33.6 g/dL (ref 30.0–36.0)
MCV: 93.6 fl (ref 78.0–100.0)
Platelets: 343 10*3/uL (ref 150.0–400.0)
RBC: 4.48 Mil/uL (ref 3.87–5.11)
RDW: 14.3 % (ref 11.5–15.5)
WBC: 6.6 10*3/uL (ref 4.0–10.5)

## 2017-12-07 LAB — LIPID PANEL
CHOLESTEROL: 220 mg/dL — AB (ref 0–200)
HDL: 63.5 mg/dL (ref >39.00)
LDL CALC: 131 mg/dL — AB (ref 0–99)
NonHDL: 156
Total CHOL/HDL Ratio: 3
Triglycerides: 127 mg/dL (ref 0.0–149.0)
VLDL: 25.4 mg/dL (ref 0.0–40.0)

## 2017-12-07 LAB — TSH: TSH: 3.11 u[IU]/mL (ref 0.35–4.50)

## 2017-12-07 LAB — HEMOGLOBIN A1C: HEMOGLOBIN A1C: 5.6 % (ref 4.6–6.5)

## 2017-12-07 LAB — URIC ACID: Uric Acid, Serum: 7.7 mg/dL — ABNORMAL HIGH (ref 2.4–7.0)

## 2017-12-07 NOTE — Assessment & Plan Note (Signed)
Encouraged DASH diet, decrease po intake and increase exercise as tolerated. Needs 7-8 hours of sleep nightly. Avoid trans fats, eat small, frequent meals every 4-5 hours with lean proteins, complex carbs and healthy fats. Minimize simple carbs 

## 2017-12-07 NOTE — Patient Instructions (Signed)

## 2017-12-07 NOTE — Assessment & Plan Note (Signed)
Well controlled, no changes to meds. Encouraged heart healthy diet such as the DASH diet and exercise as tolerated.  °

## 2017-12-07 NOTE — Assessment & Plan Note (Signed)
Encouraged heart healthy diet, increase exercise, avoid trans fats, consider a krill oil cap daily 

## 2017-12-07 NOTE — Assessment & Plan Note (Addendum)
No recent flares. Never needed to take the Allopurinol or Colchicine her pain improved.

## 2017-12-07 NOTE — Assessment & Plan Note (Signed)
hgba1c acceptable, minimize simple carbs. Increase exercise as tolerated. Continue current meds 

## 2017-12-08 LAB — URINE CULTURE
MICRO NUMBER:: 91311745
SPECIMEN QUALITY:: ADEQUATE

## 2017-12-10 DIAGNOSIS — R10A1 Flank pain, right side: Secondary | ICD-10-CM | POA: Insufficient documentation

## 2017-12-10 DIAGNOSIS — R109 Unspecified abdominal pain: Secondary | ICD-10-CM | POA: Insufficient documentation

## 2017-12-10 NOTE — Progress Notes (Signed)
Subjective:    Patient ID: Amanda Copeland, female    DOB: 1941/01/23, 77 y.o.   MRN: [id number]  No chief complaint on file.   HPI Patient is in today for follow up. She feels well today but has had some intermittent low back and right flank pain worse with movement. No radicular symptoms or incontinence. No recent injury or falls. Had a recent gout flare bit it has resolved. Denies CP/palp/SOB/HA/congestion/fevers/GI or GU c/o. Taking meds as prescribed  Past Medical History:  Diagnosis Date  . Arthritis   . Asthma    childhood now returning  . Back pain affecting pregnancy 11/02/2014  . Benign essential tremor 10/28/2014  . Bilateral dry eyes   . Breast cancer ([place])    right  . Cancer (HCC)    Breast  . Dry eyes   . Encounter for Medicare annual wellness exam 05/22/2016  . Essential hypertension, benign 10/28/2014  . H/O measles   . H/O mumps   . History of chicken pox   . Lumbago 11/02/2014  . Mixed hyperlipidemia 10/28/2014  . Obesity 10/28/2014    Past Surgical History:  Procedure Laterality Date  . BACK SURGERY    . BREAST REDUCTION SURGERY Left   . CHOLECYSTECTOMY    . EYE SURGERY  2008   b/l cataracts removed, in [place]  . MASTECTOMY Right   . REDUCTION MAMMAPLASTY    . REPLACEMENT TOTAL KNEE BILATERAL Bilateral   . ROTATOR CUFF REPAIR Left     Family History  Problem Relation Age of Onset  . Heart disease Mother   . Heart attack Mother   . Heart failure Mother   . Hypertension Mother   . Cancer Father        colon  . Asthma Father   . Parkinson's disease Father   . Cancer Maternal Grandmother        uterine  . Heart disease Maternal Grandfather   . Obesity Daughter   . Appendicitis Paternal Grandmother   . Stroke Neg Hx     Social History   Socioeconomic History  . Marital status: Married    Spouse name: Not on file  . Number of children: Not on file  . Years of education: Not on file  . Highest education level: Not on file  Occupational  History  . 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 Financial resource strain: Not on file  . Food insecurity:    Worry: Not on file    Inability: Not on file  . Transportation needs:    Medical: Not on file    Non-medical: Not on file  Tobacco Use  . Smoking status: Never Smoker  . Smokeless tobacco: Never Used  Substance and Sexual Activity  . Alcohol use: Yes    Alcohol/week: 0.0 standard drinks    Comment: two times a month  . Drug use: No  . Sexual activity: Not on file    Comment: lives with husband, moved [place], no dietary restrictions, retired Education officer, [employer]  Lifestyle  . Physical activity:    Days per week: Not on file    Minutes per session: Not on file  . Stress: Not on file  Relationships  . Social connections:    Talks on phone: Not on file    Gets together: Not on file    Attends religious service: Not on file    Active member of club or organization: Not on file  Attends meetings of clubs or organizations: Not on file    Relationship status: Not on file  . Intimate partner violence:    Fear of current or ex partner: Not on file    Emotionally abused: Not on file    Physically abused: Not on file    Forced sexual activity: Not on file  Other Topics Concern  . Not on file  Social History Narrative  . Not on file    Outpatient Medications Prior to Visit  Medication Sig Dispense Refill  . albuterol (PROAIR HFA) 108 (90 Base) MCG/ACT inhaler Inhale 2 puffs into the lungs every 6 (six) hours as needed for wheezing or shortness of breath.    Marland Kitchen aspirin 81 MG tablet Take 81 mg by mouth daily.    Marland Kitchen diltiazem (TIAZAC) 180 MG 24 hr capsule Take 1 capsule (180 mg total) by mouth daily. 90 capsule 2  . fluticasone (FLONASE) 50 MCG/ACT nasal spray USE 2 SPRAYS IN EACH NOSTRIL AT BEDTIME  11  . montelukast (SINGULAIR) 10 MG tablet TAKE 1 TABLET BY MOUTH EVERY DAY NIGHTLY 30 tablet 11  . Multiple Vitamins-Minerals (ICAPS AREDS 2) CAPS  Take 1 capsule by mouth 2 (two) times daily.    . Potassium Chloride ER 20 MEQ TBCR Take 20 mEq daily by mouth. 90 tablet 3  . PREVACID 30 MG capsule Take 1 capsule (30 mg total) by mouth 2 (two) times daily. 180 capsule 3  . propranolol (INDERAL) 20 MG tablet Take 1 tablet (20 mg total) by mouth 2 (two) times daily. 180 tablet 3  . PULMICORT FLEXHALER 90 MCG/ACT inhaler INHALE 2 PUFFS INTO THE LUNGS 2 (TWO) TIMES DAILY. 3 each 1  . Red Yeast Rice Extract (RED YEAST RICE PO) Take by mouth daily.    . colchicine 0.6 MG tablet 2 tabs po once then 1 tab q 2 hours prn gout pain til pain relief, intolerable diarrhea or max of 6 tabs in 24 hours 6 tablet 1  . furosemide (LASIX) 40 MG tablet Take 1 tablet (40 mg total) daily by mouth. 90 tablet 3  . allopurinol (ZYLOPRIM) 100 MG tablet TAKE 1 TABLET BY MOUTH EVERY DAY 30 tablet 1   No facility-administered medications prior to visit.     Allergies  Allergen Reactions  . Amoxicillin Rash  . Ampicillin Rash  . Penicillins Rash  . Statins Other (See Comments)    Per reports muscle aches and joint pain    Review of Systems  Constitutional: Negative for fever and malaise/fatigue.  HENT: Negative for congestion.   Eyes: Negative for blurred vision.  Respiratory: Negative for shortness of breath.   Cardiovascular: Negative for chest pain, palpitations and leg swelling.  Gastrointestinal: Positive for abdominal pain. Negative for blood in stool and nausea.  Genitourinary: Negative for dysuria and frequency.  Musculoskeletal: Positive for back pain. Negative for falls.  Skin: Negative for rash.  Neurological: Negative for dizziness, loss of consciousness and headaches.  Endo/Heme/Allergies: Negative for environmental allergies.  Psychiatric/Behavioral: Negative for depression. The patient is not nervous/anxious.        Objective:    Physical Exam  Constitutional: She is oriented to person, place, and time. She appears well-developed and  well-nourished. No distress.  HENT:  Head: Normocephalic and atraumatic.  Nose: Nose normal.  Eyes: Right eye exhibits no discharge. Left eye exhibits no discharge.  Neck: Normal range of motion. Neck supple.  Cardiovascular: Normal rate and regular rhythm.  No murmur heard. Pulmonary/Chest: Effort  normal and breath sounds normal.  Abdominal: Soft. Bowel sounds are normal. There is no tenderness.  Musculoskeletal: She exhibits no edema.  Neurological: She is alert and oriented to person, place, and time.  Skin: Skin is warm and dry.  Psychiatric: She has a normal mood and affect.  Nursing note and vitals reviewed.   BP (!) 142/70 (BP Location: Left Arm, Patient Position: Sitting, Cuff Size: Normal)   Pulse 67   Temp 97.8 F (36.6 C) (Oral)   Resp 18   Wt 241 lb 12.8 oz (109.7 kg)   SpO2 97%   BMI 44.23 kg/m  Wt Readings from Last 3 Encounters:  12/07/17 241 lb 12.8 oz (109.7 kg)  09/15/17 243 lb (110.2 kg)  08/17/17 244 lb (110.7 kg)     Lab Results  Component Value Date   WBC 6.6 12/07/2017   HGB 14.1 12/07/2017   HCT 41.9 12/07/2017   PLT 343.0 12/07/2017   GLUCOSE 148 (H) 12/07/2017   CHOL 220 (H) 12/07/2017   TRIG 127.0 12/07/2017   HDL 63.50 12/07/2017   LDLDIRECT 115.0 10/27/2016   LDLCALC 131 (H) 12/07/2017   ALT 11 12/07/2017   AST 16 12/07/2017   NA 142 12/07/2017   K 3.9 12/07/2017   CL 103 12/07/2017   CREATININE 0.89 12/07/2017   BUN 18 12/07/2017   CO2 30 12/07/2017   TSH 3.11 12/07/2017   HGBA1C 5.6 12/07/2017    Lab Results  Component Value Date   TSH 3.11 12/07/2017   Lab Results  Component Value Date   WBC 6.6 12/07/2017   HGB 14.1 12/07/2017   HCT 41.9 12/07/2017   MCV 93.6 12/07/2017   PLT 343.0 12/07/2017   Lab Results  Component Value Date   NA 142 12/07/2017   K 3.9 12/07/2017   CO2 30 12/07/2017   GLUCOSE 148 (H) 12/07/2017   BUN 18 12/07/2017   CREATININE 0.89 12/07/2017   BILITOT 0.4 12/07/2017   ALKPHOS 85  12/07/2017   AST 16 12/07/2017   ALT 11 12/07/2017   PROT 6.5 12/07/2017   ALBUMIN 4.0 12/07/2017   CALCIUM 9.5 12/07/2017   GFR 65.20 12/07/2017   Lab Results  Component Value Date   CHOL 220 (H) 12/07/2017   Lab Results  Component Value Date   HDL 63.50 12/07/2017   Lab Results  Component Value Date   LDLCALC 131 (H) 12/07/2017   Lab Results  Component Value Date   TRIG 127.0 12/07/2017   Lab Results  Component Value Date   CHOLHDL 3 12/07/2017   Lab Results  Component Value Date   HGBA1C 5.6 12/07/2017       Assessment & Plan:   Problem List Items Addressed This Visit    Essential hypertension, benign    Well controlled, no changes to meds. Encouraged heart healthy diet such as the DASH diet and exercise as tolerated.       Relevant Orders   CBC (Completed)   Comprehensive metabolic panel (Completed)   TSH (Completed)   Mixed hyperlipidemia    Encouraged heart healthy diet, increase exercise, avoid trans fats, consider a krill oil cap daily      Relevant Orders   Lipid panel (Completed)   Hyperglycemia    hgba1c acceptable, minimize simple carbs. Increase exercise as tolerated. Continue current meds      Relevant Orders   Hemoglobin A1c (Completed)   Gout    No recent flares. Never needed to take the Allopurinol or Colchicine  her pain improved.      Relevant Orders   Uric acid (Completed)   Morbid obesity ([place])    Encouraged DASH diet, decrease po intake and increase exercise as tolerated. Needs 7-8 hours of sleep nightly. Avoid trans fats, eat small, frequent meals every 4-5 hours with lean proteins, complex carbs and healthy fats. Minimize simple carbs      Right flank pain - Primary    Encouraged moist heat and gentle stretching as tolerated. May try NSAIDs and prescription meds as directed and report if symptoms worsen or seek immediate care. Lumbar xray ordered      Relevant Orders   Urinalysis   Urine Culture (Completed)   DG Lumbar  Spine Complete   Urinalysis   Urine Culture      I have discontinued Marcie Bal Berenguer's colchicine and allopurinol. I am also having her maintain her Red Yeast Rice Extract (RED YEAST RICE PO), aspirin, diltiazem, ICAPS AREDS 2, Potassium Chloride ER, furosemide, albuterol, fluticasone, montelukast, PREVACID, propranolol, and PULMICORT FLEXHALER.  No orders of the defined types were placed in this encounter.    [name], MD

## 2017-12-10 NOTE — Assessment & Plan Note (Signed)
Encouraged moist heat and gentle stretching as tolerated. May try NSAIDs and prescription meds as directed and report if symptoms worsen or seek immediate care. Lumbar xray ordered

## 2017-12-11 ENCOUNTER — Ambulatory Visit (HOSPITAL_BASED_OUTPATIENT_CLINIC_OR_DEPARTMENT_OTHER)
Admission: RE | Admit: 2017-12-11 | Discharge: 2017-12-11 | Disposition: A | Discharge status: Home / Self Care | Payer: Medicare Other | Source: Ambulatory Visit | Attending: Family Medicine | Admitting: Family Medicine

## 2017-12-11 DIAGNOSIS — R109 Unspecified abdominal pain: Secondary | ICD-10-CM | POA: Insufficient documentation

## 2017-12-11 DIAGNOSIS — M545 Low back pain: Secondary | ICD-10-CM | POA: Diagnosis not present

## 2017-12-20 ENCOUNTER — Ambulatory Visit (INDEPENDENT_AMBULATORY_CARE_PROVIDER_SITE_OTHER): Payer: Medicare Other | Admitting: Allergy and Immunology

## 2017-12-20 ENCOUNTER — Encounter: Payer: Self-pay | Admitting: Allergy and Immunology

## 2017-12-20 VITALS — BP 116/76 | HR 69 | Temp 97.8°F | Resp 16 | Ht 60.24 in | Wt 240.8 lb

## 2017-12-20 DIAGNOSIS — J4540 Moderate persistent asthma, uncomplicated: Secondary | ICD-10-CM

## 2017-12-20 DIAGNOSIS — R059 Cough, unspecified: Secondary | ICD-10-CM | POA: Insufficient documentation

## 2017-12-20 DIAGNOSIS — R05 Cough: Secondary | ICD-10-CM | POA: Diagnosis not present

## 2017-12-20 DIAGNOSIS — J310 Chronic rhinitis: Secondary | ICD-10-CM | POA: Insufficient documentation

## 2017-12-20 DIAGNOSIS — R053 Chronic cough: Secondary | ICD-10-CM | POA: Insufficient documentation

## 2017-12-20 MED ORDER — ALBUTEROL SULFATE HFA 108 (90 BASE) MCG/ACT IN AERS: 2.0000 | INHALATION_SPRAY | RESPIRATORY_TRACT | 1 refills | Status: DC | PRN

## 2017-12-20 MED ORDER — FLUTICASONE PROPIONATE HFA 110 MCG/ACT IN AERO: INHALATION_SPRAY | RESPIRATORY_TRACT | 12 refills | Status: DC

## 2017-12-20 MED ORDER — MONTELUKAST SODIUM 10 MG PO TABS: ORAL_TABLET | ORAL | 5 refills | Status: DC

## 2017-12-20 MED ORDER — AZELASTINE-FLUTICASONE 137-50 MCG/ACT NA SUSP: NASAL | 5 refills | Status: DC

## 2017-12-20 NOTE — Patient Instructions (Addendum)
Moderate persistent asthma Currently with suboptimal control.  In addition, todays spirometry results, assessed while asymptomatic, suggest under-perception of bronchoconstriction.  A prescription has been provided for Flovent (fluticasone) 110 g,  2 inhalations twice a day. To maximize pulmonary deposition, a spacer has been provided along with instructions for its proper administration with an HFA inhaler.  For now, continue montelukast 10 mg daily bedtime and albuterol HFA, 1-2 elations every 4-6 hours as needed and 10 to 15 minutes prior to exertion/exercise.  Chronic nonallergic rhinitis Epicutaneous and intradermal environmental tests were negative today despite a positive histamine control.  A prescription has been provided for Dymista (azelastine/fluticasone) nasal spray, 1 spray per nostril twice daily as needed. Proper nasal spray technique has been discussed and demonstrated.  Nasal saline lavage (NeilMed) has been recommended as needed and prior to medicated nasal sprays along with instructions for proper administration.  Add guaifenesin [id number] mg (Mucinex)  twice daily as needed with adequate hydration as discussed.   Return in about 4 months (around 04/20/2018), or if symptoms worsen or fail to improve.

## 2017-12-20 NOTE — Assessment & Plan Note (Signed)
Most likely multifactorial with contribution from thick postnasal drainage and bronchial hyperresponsiveness.  Treatment plan as outlined above.

## 2017-12-20 NOTE — Assessment & Plan Note (Addendum)
Epicutaneous and intradermal environmental tests were negative today despite a positive histamine control.  A prescription has been provided for Dymista (azelastine/fluticasone) nasal spray, 1 spray per nostril twice daily as needed. Proper nasal spray technique has been discussed and demonstrated.  Nasal saline lavage (NeilMed) has been recommended as needed and prior to medicated nasal sprays along with instructions for proper administration.  Add guaifenesin [phone] mg (Mucinex)  twice daily as needed with adequate hydration as discussed.

## 2017-12-20 NOTE — Progress Notes (Signed)
New Patient Note  RE: Amanda Copeland MRN: [id number] DOB: 27-Aug-1940 Date of Office Visit: 12/20/2017  Referring provider: [name], MD Primary care provider: [name], MD  Chief Complaint: Asthma and Sinus Problem   History of present illness: Amanda Copeland is a 77 y.o. female seen today in consultation requested by [name], MD.  She reports that she underwent pulmonary function testing with Dr. Melvyn Novas and was diagnosed with asthma.  She recalls initially having asthma symptoms when she was around 77 years of age and then her asthma symptoms resolved during her teen years.  However, over the past 2 or 3 years she has began once again to experience dyspnea with mild exertion as well as wheezing, coughing, and chest tightness, typically with upper respiratory tract infections.  She reports that she has a bout of bronchitis every winter. She experiences nasal congestion, "a lot" of postnasal drainage occasionally with throat irritation and hoarseness, and occasional sinus pressure.  The nasal congestion has improved significantly while taking 2 sprays per nostril of fluticasone nasal spray prior to bed.  Regarding these symptoms, no significant seasonal symptom variation has been noted nor have specific environmental triggers been identified.  She notes that she has rhinorrhea when consuming hot (temperature) foods.  Assessment and plan: Moderate persistent asthma Currently with suboptimal control.  In addition, todays spirometry results, assessed while asymptomatic, suggest under-perception of bronchoconstriction.  A prescription has been provided for Flovent (fluticasone) 110 g,  2 inhalations twice a day. To maximize pulmonary deposition, a spacer has been provided along with instructions for its proper administration with an HFA inhaler.  For now, continue montelukast 10 mg daily bedtime and albuterol HFA, 1-2 elations every 4-6 hours as needed and 10 to 15 minutes prior to  exertion/exercise.  Chronic nonallergic rhinitis Epicutaneous and intradermal environmental tests were negative today despite a positive histamine control.  A prescription has been provided for Dymista (azelastine/fluticasone) nasal spray, 1 spray per nostril twice daily as needed. Proper nasal spray technique has been discussed and demonstrated.  Nasal saline lavage (NeilMed) has been recommended as needed and prior to medicated nasal sprays along with instructions for proper administration.  Add guaifenesin [phone] mg (Mucinex)  twice daily as needed with adequate hydration as discussed.  Cough, persistent Most likely multifactorial with contribution from thick postnasal drainage and bronchial hyperresponsiveness.  Treatment plan as outlined above.   Meds ordered this encounter  Medications  . fluticasone (FLOVENT HFA) 110 MCG/ACT inhaler    Sig: 2 puffs twice daily to prevent coughing or wheezing.    Dispense:  1 Inhaler    Refill:  12  . Azelastine-Fluticasone 137-50 MCG/ACT SUSP    Sig: 1 spray per nostril twice daily for runny nose    Dispense:  1 Bottle    Refill:  5  . montelukast (SINGULAIR) 10 MG tablet    Sig: Take 1 tablet once at bedtime for coughing or wheezing.    Dispense:  30 tablet    Refill:  5  . albuterol (PROAIR HFA) 108 (90 Base) MCG/ACT inhaler    Sig: Inhale 2 puffs into the lungs every 4 (four) hours as needed for wheezing or shortness of breath.    Dispense:  1 Inhaler    Refill:  1    Diagnostics: Spirometry: FVC was 1.98 L and FEV1 is 1.61 L with significant (250 mL, 16%) postbronchodilator improvement.  This study was performed while the patient was asymptomatic.  Please see scanned  spirometry results for details. Epicutaneous testing: Negative despite a positive histamine control. Intradermal testing: Negative.    Physical examination: Blood pressure 116/76, pulse 69, temperature 97.8 F (36.6 C), temperature source Oral, resp. rate 16,  height 5' 0.24" (1.53 m), weight 240 lb 12.8 oz (109.2 kg), SpO2 97 %.  General: Alert, interactive, in no acute distress. HEENT: TMs pearly gray, turbinates moderately edematous without discharge, post-pharynx moderately erythematous. Neck: Supple without lymphadenopathy. Lungs: Clear to auscultation without wheezing, rhonchi or rales. CV: Normal S1, S2 without murmurs. Abdomen: Nondistended, nontender. Skin: Warm and dry, without lesions or rashes. Extremities:  No clubbing, cyanosis or edema. Neuro:   Grossly intact.  Review of systems:  Review of systems negative except as noted in HPI / PMHx or noted below: Review of Systems  Constitutional: Negative.   HENT: Negative.   Eyes: Negative.   Respiratory: Negative.   Cardiovascular: Negative.   Gastrointestinal: Negative.   Genitourinary: Negative.   Musculoskeletal: Negative.   Skin: Negative.   Neurological: Negative.   Endo/Heme/Allergies: Negative.   Psychiatric/Behavioral: Negative.     Past medical history:  Past Medical History:  Diagnosis Date  . Arthritis   . Asthma    childhood now returning  . Back pain affecting pregnancy 11/02/2014  . Benign essential tremor 10/28/2014  . Bilateral dry eyes   . Breast cancer ([place])    right  . Cancer ([facility])    Breast  . Dry eyes   . Encounter for Medicare annual wellness exam 05/22/2016  . Essential hypertension, benign 10/28/2014  . H/O measles   . H/O mumps   . History of chicken pox   . Lumbago 11/02/2014  . Mixed hyperlipidemia 10/28/2014  . Obesity 10/28/2014    Past surgical history:  Past Surgical History:  Procedure Laterality Date  . BACK SURGERY    . BREAST REDUCTION SURGERY Left   . CHOLECYSTECTOMY    . EYE SURGERY  2008   b/l cataracts removed, in [place]  . MASTECTOMY Right   . REDUCTION MAMMAPLASTY    . REPLACEMENT TOTAL KNEE BILATERAL Bilateral   . ROTATOR CUFF REPAIR Left     Family history: Family History  Problem Relation Age of Onset  .  Heart disease Mother   . Heart attack Mother   . Heart failure Mother   . Hypertension Mother   . Cancer Father        colon  . Asthma Father   . Parkinson's disease Father   . Cancer Maternal Grandmother        uterine  . Heart disease Maternal Grandfather   . Obesity Daughter   . Appendicitis Paternal Grandmother   . Stroke Neg Hx     Social history: Social History   Socioeconomic History  . Marital status: Married    Spouse name: Not on file  . Number of children: Not on file  . Years of education: Not on file  . Highest education level: Not on file  Occupational History  . 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 Financial resource strain: Not on file  . Food insecurity:    Worry: Not on file    Inability: Not on file  . Transportation needs:    Medical: Not on file    Non-medical: Not on file  Tobacco Use  . Smoking status: Never Smoker  . Smokeless tobacco: Never Used  Substance and Sexual Activity  . Alcohol use: Yes  Alcohol/week: 0.0 standard drinks    Comment: two times a month  . Drug use: No  . Sexual activity: Not on file    Comment: lives with husband, moved [place], no dietary restrictions, retired Education officer, [employer]  Lifestyle  . Physical activity:    Days per week: Not on file    Minutes per session: Not on file  . Stress: Not on file  Relationships  . Social connections:    Talks on phone: Not on file    Gets together: Not on file    Attends religious service: Not on file    Active member of club or organization: Not on file    Attends meetings of clubs or organizations: Not on file    Relationship status: Not on file  . Intimate partner violence:    Fear of current or ex partner: Not on file    Emotionally abused: Not on file    Physically abused: Not on file    Forced sexual activity: Not on file  Other Topics Concern  . Not on file  Social History Narrative  . Not on file   Environmental History: The  patient lives in a townhouse with carpeting in the bedroom, gas heat, and central air.  She is a non-smoker without pets.  There is no known mold/water damage in the home.  Allergies as of 12/20/2017      Reactions   Amoxicillin Rash   Ampicillin Rash   Penicillins Rash   Statins Other (See Comments)   Per reports muscle aches and joint pain      Medication List        Accurate as of 12/20/17  2:01 PM. Always use your most recent med list.          allopurinol 100 MG tablet Commonly known as:  ZYLOPRIM Take 100 mg by mouth daily.   aspirin 81 MG tablet Take 81 mg by mouth daily.   Azelastine-Fluticasone 137-50 MCG/ACT Susp 1 spray per nostril twice daily for runny nose   diltiazem 180 MG 24 hr capsule Commonly known as:  TIAZAC Take 1 capsule (180 mg total) by mouth daily.   fluticasone 110 MCG/ACT inhaler Commonly known as:  FLOVENT HFA 2 puffs twice daily to prevent coughing or wheezing.   fluticasone 50 MCG/ACT nasal spray Commonly known as:  FLONASE USE 2 SPRAYS IN EACH NOSTRIL AT BEDTIME   furosemide 40 MG tablet Commonly known as:  LASIX Take 1 tablet (40 mg total) daily by mouth.   ICAPS AREDS 2 Caps Take 1 capsule by mouth 2 (two) times daily.   montelukast 10 MG tablet Commonly known as:  SINGULAIR TAKE 1 TABLET BY MOUTH EVERY DAY NIGHTLY   montelukast 10 MG tablet Commonly known as:  SINGULAIR Take 1 tablet once at bedtime for coughing or wheezing.   Potassium Chloride ER 20 MEQ Tbcr Take 20 mEq daily by mouth.   PREVACID 30 MG capsule Generic drug:  lansoprazole Take 1 capsule (30 mg total) by mouth 2 (two) times daily.   PROAIR HFA 108 (90 Base) MCG/ACT inhaler Generic drug:  albuterol Inhale 2 puffs into the lungs every 6 (six) hours as needed for wheezing or shortness of breath.   albuterol 108 (90 Base) MCG/ACT inhaler Commonly known as:  PROVENTIL HFA;VENTOLIN HFA Inhale 2 puffs into the lungs every 4 (four) hours as needed for  wheezing or shortness of breath.   propranolol 20 MG tablet Commonly known as:  INDERAL Take 1  tablet (20 mg total) by mouth 2 (two) times daily.   PULMICORT FLEXHALER 90 MCG/ACT inhaler Generic drug:  Budesonide INHALE 2 PUFFS INTO THE LUNGS 2 (TWO) TIMES DAILY.   RED YEAST RICE PO Take by mouth daily.       Known medication allergies: Allergies  Allergen Reactions  . Amoxicillin Rash  . Ampicillin Rash  . Penicillins Rash  . Statins Other (See Comments)    Per reports muscle aches and joint pain    I appreciate the opportunity to take part in Jolanda's care. Please do not hesitate to contact me with questions.  Sincerely,   R. Edgar Frisk, MD

## 2017-12-20 NOTE — Assessment & Plan Note (Signed)
Currently with suboptimal control.  In addition, todays spirometry results, assessed while asymptomatic, suggest under-perception of bronchoconstriction.  A prescription has been provided for Flovent (fluticasone) 110 g, 2 inhalations twice a day. To maximize pulmonary deposition, a spacer has been provided along with instructions for its proper administration with an HFA inhaler.  For now, continue montelukast 10 mg daily bedtime and albuterol HFA, 1-2 elations every 4-6 hours as needed and 10 to 15 minutes prior to exertion/exercise.

## 2017-12-22 ENCOUNTER — Other Ambulatory Visit: Payer: Self-pay | Admitting: Family Medicine

## 2018-01-10 ENCOUNTER — Other Ambulatory Visit: Payer: Self-pay | Admitting: Interventional Cardiology

## 2018-01-16 NOTE — Progress Notes (Signed)
Cardiology Office Note   Date:  01/18/2018   ID:  Amanda Copeland, DOB 11/18/1940, MRN [id number]  PCP:  [name], MD    No chief complaint on file.  RF for CAD  Wt Readings from Last 3 Encounters:  01/18/18 241 lb 12.8 oz (109.7 kg)  12/20/17 240 lb 12.8 oz (109.2 kg)  12/07/17 241 lb 12.8 oz (109.7 kg)       History of Present Illness: Amanda Copeland is a 77 y.o. female  Who has RF for CAD. SHe has had a stress test several years ago that was normal.  She is intolerant to statins to muscle pain and fatigue. SHe took atorvastatin. She also tried another, maybe pravastatin but did not tolerate this.   It was recommended that she follow the DASH diet for weight loss.   In 2018, She saw pulmonary.  She was told her lungs are ok.  Lived in [place], then moved to the [place] area.   She never tried the DASH diet.    She does minimize salt.  Denies : Chest pain. Dizziness. Leg edema. Nitroglycerin use. Orthopnea. Palpitations. Paroxysmal nocturnal dyspnea. Syncope.   DOE.  Not active in the winter.  No access to exercise equipment.    Past Medical History:  Diagnosis Date  . Arthritis   . Asthma    childhood now returning  . Back pain affecting pregnancy 11/02/2014  . Benign essential tremor 10/28/2014  . Bilateral dry eyes   . Breast cancer ([place])    right  . Cancer ([facility])    Breast  . Dry eyes   . Encounter for Medicare annual wellness exam 05/22/2016  . Essential hypertension, benign 10/28/2014  . H/O measles   . H/O mumps   . History of chicken pox   . Lumbago 11/02/2014  . Mixed hyperlipidemia 10/28/2014  . Obesity 10/28/2014    Past Surgical History:  Procedure Laterality Date  . BACK SURGERY    . BREAST REDUCTION SURGERY Left   . CHOLECYSTECTOMY    . EYE SURGERY  2008   b/l cataracts removed, in [place]  . MASTECTOMY Right   . REDUCTION MAMMAPLASTY    . REPLACEMENT TOTAL KNEE BILATERAL Bilateral   . ROTATOR CUFF REPAIR Left       Current Outpatient Medications  Medication Sig Dispense Refill  . albuterol (PROAIR HFA) 108 (90 Base) MCG/ACT inhaler Inhale 2 puffs into the lungs every 6 (six) hours as needed for wheezing or shortness of breath.    . allopurinol (ZYLOPRIM) 100 MG tablet Take 100 mg by mouth daily.  1  . aspirin 81 MG tablet Take 81 mg by mouth daily.    . Azelastine-Fluticasone 137-50 MCG/ACT SUSP 1 spray per nostril twice daily for runny nose 1 Bottle 5  . diltiazem (TIAZAC) 180 MG 24 hr capsule Take 1 capsule (180 mg total) by mouth daily. 90 capsule 2  . fluticasone (FLONASE) 50 MCG/ACT nasal spray USE 2 SPRAYS IN EACH NOSTRIL AT BEDTIME  11  . fluticasone (FLOVENT HFA) 110 MCG/ACT inhaler 2 puffs twice daily to prevent coughing or wheezing. 1 Inhaler 12  . furosemide (LASIX) 40 MG tablet Take 1 tablet (40 mg total) daily by mouth. 90 tablet 3  . KLOR-CON M20 20 MEQ tablet TAKE 20 MEQ ( 1 TABLET) DAILY BY MOUTH. 30 tablet 1  . montelukast (SINGULAIR) 10 MG tablet TAKE 1 TABLET BY MOUTH EVERY DAY NIGHTLY 30 tablet 11  . Multiple Vitamins-Minerals (  ICAPS AREDS 2) CAPS Take 1 capsule by mouth 2 (two) times daily.    Marland Kitchen PREVACID 30 MG capsule Take 1 capsule (30 mg total) by mouth 2 (two) times daily. 180 capsule 3  . propranolol (INDERAL) 20 MG tablet Take 1 tablet (20 mg total) by mouth 2 (two) times daily. 180 tablet 3  . Red Yeast Rice Extract (RED YEAST RICE PO) Take by mouth daily.     No current facility-administered medications for this visit.     Allergies:   Amoxicillin; Ampicillin; Penicillins; and Statins    Social History:  The patient  reports that she has never smoked. She has never used smokeless tobacco. She reports current alcohol use. She reports that she does not use drugs.   Family History:  The patient's family history includes Appendicitis in her paternal grandmother; Asthma in her father; Cancer in her father and maternal grandmother; Heart attack in her mother; Heart disease  in her maternal grandfather and mother; Heart failure in her mother; Hypertension in her mother; Obesity in her daughter; Parkinson's disease in her father.    ROS:  Please see the history of present illness.   Otherwise, review of systems are positive for difficulty losing weight.   All other systems are reviewed and negative.    PHYSICAL EXAM: VS:  BP 138/70   Pulse (!) 50   Ht 5' (1.524 m)   Wt 241 lb 12.8 oz (109.7 kg)   SpO2 95%   BMI 47.22 kg/m  , BMI Body mass index is 47.22 kg/m. GEN: Well nourished, well developed, in no acute distress  HEENT: normal  Neck: no JVD, carotid bruits, or masses Cardiac: RRR; no murmurs, rubs, or gallops,no edema  Respiratory:  clear to auscultation bilaterally, normal work of breathing GI: soft, nontender, nondistended, + BS MS: no deformity or atrophy  Skin: warm and dry, no rash Neuro:  Strength and sensation are intact Psych: euthymic mood, full affect   EKG:   The ekg ordered today demonstrates NSR, no ST changes   Recent Labs: 12/07/2017: ALT 11; BUN 18; Creatinine, Ser 0.89; Hemoglobin 14.1; Platelets 343.0; Potassium 3.9; Sodium 142; TSH 3.11   Lipid Panel    Component Value Date/Time   CHOL 220 (H) 12/07/2017 1209   TRIG 127.0 12/07/2017 1209   HDL 63.50 12/07/2017 1209   CHOLHDL 3 12/07/2017 1209   VLDL 25.4 12/07/2017 1209   LDLCALC 131 (H) 12/07/2017 1209   LDLDIRECT 115.0 10/27/2016 1145     Other studies Reviewed: Additional studies/ records that were reviewed today with results demonstrating: prior ECG reviewed.   ASSESSMENT AND PLAN:  1. Chronic diastolic heart failure: She appears euvolemic. I stressed the importance of a healthy diet, DASH diet, increased exercise.  2. Hypertension: The current medical regimen is effective;  continue present plan and medications. 3. Hypercholesterolemia: LDL 131.  Healthy diet and exercise stressed.    Current medicines are reviewed at length with the patient today.  The  patient concerns regarding her medicines were addressed.  The following changes have been made:  No change  Labs/ tests ordered today include:  No orders of the defined types were placed in this encounter.   Recommend 150 minutes/week of aerobic exercise Low fat, low carb, high fiber diet recommended  Disposition:   FU in 1 year   Signed, Larae Grooms, MD  01/18/2018 11:28 AM    [address], [place], [place]  [address] Phone: [phone])  [phone]; Fax: [phone]

## 2018-01-18 ENCOUNTER — Ambulatory Visit (INDEPENDENT_AMBULATORY_CARE_PROVIDER_SITE_OTHER): Payer: Medicare Other | Admitting: Interventional Cardiology

## 2018-01-18 ENCOUNTER — Encounter: Payer: Self-pay | Admitting: Interventional Cardiology

## 2018-01-18 VITALS — BP 138/70 | HR 50 | Ht 60.0 in | Wt 241.8 lb

## 2018-01-18 DIAGNOSIS — I10 Essential (primary) hypertension: Secondary | ICD-10-CM | POA: Diagnosis not present

## 2018-01-18 DIAGNOSIS — I5032 Chronic diastolic (congestive) heart failure: Secondary | ICD-10-CM | POA: Diagnosis not present

## 2018-01-18 DIAGNOSIS — E782 Mixed hyperlipidemia: Secondary | ICD-10-CM | POA: Diagnosis not present

## 2018-01-18 NOTE — Patient Instructions (Signed)
Medication Instructions:  Your physician recommends that you continue on your current medications as directed. Please refer to the Current Medication list given to you today.  If you need a refill on your cardiac medications before your next appointment, please call your pharmacy.   Lab work: None Ordered  If you have labs (blood work) drawn today and your tests are completely normal, you will receive your results only by: Marland Kitchen MyChart Message (if you have MyChart) OR . A paper copy in the mail If you have any lab test that is abnormal or we need to change your treatment, we will call you to review the results.  Testing/Procedures: None ordered  Follow-Up: At [facility], you and your health needs are our priority.  As part of our continuing mission to provide you with exceptional heart care, we have created designated Provider Care Teams.  These Care Teams include your primary Cardiologist (physician) and Advanced Practice Providers (APPs -  Physician Assistants and Nurse Practitioners) who all work together to provide you with the care you need, when you need it. . You will need a follow up appointment in 1 year.  Please call our office 2 months in advance to schedule this appointment.  You may see Casandra Doffing, MD or one of the following Advanced Practice Providers on your designated Care Team:   . Lyda Jester, PA-C . Dayna Dunn, PA-C . Ermalinda Barrios, PA-C  Any Other Special Instructions Will Be Listed Below (If Applicable).  You should get 150 minutes of exercise a week   DASH Eating Plan DASH stands for "Dietary Approaches to Stop Hypertension." The DASH eating plan is a healthy eating plan that has been shown to reduce high blood pressure (hypertension). It may also reduce your risk for type 2 diabetes, heart disease, and stroke. The DASH eating plan may also help with weight loss. What are tips for following this plan? General guidelines  Avoid eating more than 2,300 mg  (milligrams) of salt (sodium) a day. If you have hypertension, you may need to reduce your sodium intake to 1,500 mg a day.  Limit alcohol intake to no more than 1 drink a day for nonpregnant women and 2 drinks a day for men. One drink equals 12 oz of beer, 5 oz of wine, or 1 oz of hard liquor.  Work with your health care provider to maintain a healthy body weight or to lose weight. Ask what an ideal weight is for you.  Get at least 30 minutes of exercise that causes your heart to beat faster (aerobic exercise) most days of the week. Activities may include walking, swimming, or biking.  Work with your health care provider or diet and nutrition specialist (dietitian) to adjust your eating plan to your individual calorie needs. Reading food labels  Check food labels for the amount of sodium per serving. Choose foods with less than 5 percent of the Daily Value of sodium. Generally, foods with less than 300 mg of sodium per serving fit into this eating plan.  To find whole grains, look for the word "whole" as the first word in the ingredient list. Shopping  Buy products labeled as "low-sodium" or "no salt added."  Buy fresh foods. Avoid canned foods and premade or frozen meals. Cooking  Avoid adding salt when cooking. Use salt-free seasonings or herbs instead of table salt or sea salt. Check with your health care provider or pharmacist before using salt substitutes.  Do not fry foods. Cook foods using  healthy methods such as baking, boiling, grilling, and broiling instead.  Cook with heart-healthy oils, such as olive, canola, soybean, or sunflower oil. Meal planning   Eat a balanced diet that includes: ? 5 or more servings of fruits and vegetables each day. At each meal, try to fill half of your plate with fruits and vegetables. ? Up to 6-8 servings of whole grains each day. ? Less than 6 oz of lean meat, poultry, or fish each day. A 3-oz serving of meat is about the same size as a deck  of cards. One egg equals 1 oz. ? 2 servings of low-fat dairy each day. ? A serving of nuts, seeds, or beans 5 times each week. ? Heart-healthy fats. Healthy fats called Omega-3 fatty acids are found in foods such as flaxseeds and coldwater fish, like sardines, salmon, and mackerel.  Limit how much you eat of the following: ? Canned or prepackaged foods. ? Food that is high in trans fat, such as fried foods. ? Food that is high in saturated fat, such as fatty meat. ? Sweets, desserts, sugary drinks, and other foods with added sugar. ? Full-fat dairy products.  Do not salt foods before eating.  Try to eat at least 2 vegetarian meals each week.  Eat more home-cooked food and less restaurant, buffet, and fast food.  When eating at a restaurant, ask that your food be prepared with less salt or no salt, if possible. What foods are recommended? The items listed may not be a complete list. Talk with your dietitian about what dietary choices are best for you. Grains Whole-grain or whole-wheat bread. Whole-grain or whole-wheat pasta. Brown rice. [place] Morrow. Bulgur. Whole-grain and low-sodium cereals. Pita bread. Low-fat, low-sodium crackers. Whole-wheat flour tortillas. Vegetables Fresh or frozen vegetables (raw, steamed, roasted, or grilled). Low-sodium or reduced-sodium tomato and vegetable juice. Low-sodium or reduced-sodium tomato sauce and tomato paste. Low-sodium or reduced-sodium canned vegetables. Fruits All fresh, dried, or frozen fruit. Canned fruit in natural juice (without added sugar). Meat and other protein foods Skinless chicken or [place]. Ground chicken or [place]. Pork with fat trimmed off. Fish and seafood. Egg whites. Dried beans, peas, or lentils. Unsalted nuts, nut butters, and seeds. Unsalted canned beans. Lean cuts of beef with fat trimmed off. Low-sodium, lean deli meat. Dairy Low-fat (1%) or fat-free (skim) milk. Fat-free, low-fat, or reduced-fat cheeses. Nonfat,  low-sodium ricotta or cottage cheese. Low-fat or nonfat yogurt. Low-fat, low-sodium cheese. Fats and oils Soft margarine without trans fats. Vegetable oil. Low-fat, reduced-fat, or light mayonnaise and salad dressings (reduced-sodium). Canola, safflower, olive, soybean, and sunflower oils. Avocado. Seasoning and other foods Herbs. Spices. Seasoning mixes without salt. Unsalted popcorn and pretzels. Fat-free sweets. What foods are not recommended? The items listed may not be a complete list. Talk with your dietitian about what dietary choices are best for you. Grains Baked goods made with fat, such as croissants, muffins, or some breads. Dry pasta or rice meal packs. Vegetables Creamed or fried vegetables. Vegetables in a cheese sauce. Regular canned vegetables (not low-sodium or reduced-sodium). Regular canned tomato sauce and paste (not low-sodium or reduced-sodium). Regular tomato and vegetable juice (not low-sodium or reduced-sodium). Angie Fava. Olives. Fruits Canned fruit in a light or heavy syrup. Fried fruit. Fruit in cream or butter sauce. Meat and other protein foods Fatty cuts of meat. Ribs. Fried meat. Berniece Salines. Sausage. Bologna and other processed lunch meats. Salami. Fatback. Hotdogs. Bratwurst. Salted nuts and seeds. Canned beans with added salt. Canned or smoked fish.  Whole eggs or egg yolks. Chicken or [place] with skin. Dairy Whole or 2% milk, cream, and half-and-half. Whole or full-fat cream cheese. Whole-fat or sweetened yogurt. Full-fat cheese. Nondairy creamers. Whipped toppings. Processed cheese and cheese spreads. Fats and oils Butter. Stick margarine. Lard. Shortening. Ghee. Bacon fat. Tropical oils, such as coconut, palm kernel, or palm oil. Seasoning and other foods Salted popcorn and pretzels. Onion salt, garlic salt, seasoned salt, table salt, and sea salt. Worcestershire sauce. Tartar sauce. Barbecue sauce. Teriyaki sauce. Soy sauce, including reduced-sodium. Steak sauce.  Canned and packaged gravies. Fish sauce. Oyster sauce. Cocktail sauce. Horseradish that you find on the shelf. Ketchup. Mustard. Meat flavorings and tenderizers. Bouillon cubes. Hot sauce and Tabasco sauce. Premade or packaged marinades. Premade or packaged taco seasonings. Relishes. Regular salad dressings. Where to find more information:  National Heart, Lung, and [place]: https://wilson-eaton.com/  American Heart Association: www.heart.org Summary  The DASH eating plan is a healthy eating plan that has been shown to reduce high blood pressure (hypertension). It may also reduce your risk for type 2 diabetes, heart disease, and stroke.  With the DASH eating plan, you should limit salt (sodium) intake to 2,300 mg a day. If you have hypertension, you may need to reduce your sodium intake to 1,500 mg a day.  When on the DASH eating plan, aim to eat more fresh fruits and vegetables, whole grains, lean proteins, low-fat dairy, and heart-healthy fats.  Work with your health care provider or diet and nutrition specialist (dietitian) to adjust your eating plan to your individual calorie needs. This information is not intended to replace advice given to you by your health care provider. Make sure you discuss any questions you have with your health care provider. Document Released: 01/13/2011 Document Revised: 01/18/2016 Document Reviewed: 01/18/2016 Elsevier Interactive Patient Education  [employer].

## 2018-01-30 DIAGNOSIS — J209 Acute bronchitis, unspecified: Secondary | ICD-10-CM | POA: Diagnosis not present

## 2018-02-04 DIAGNOSIS — R05 Cough: Secondary | ICD-10-CM | POA: Diagnosis not present

## 2018-02-04 DIAGNOSIS — J40 Bronchitis, not specified as acute or chronic: Secondary | ICD-10-CM | POA: Diagnosis not present

## 2018-02-08 ENCOUNTER — Other Ambulatory Visit: Payer: Self-pay | Admitting: Allergy and Immunology

## 2018-02-13 DIAGNOSIS — H04123 Dry eye syndrome of bilateral lacrimal glands: Secondary | ICD-10-CM | POA: Diagnosis not present

## 2018-02-13 DIAGNOSIS — H01001 Unspecified blepharitis right upper eyelid: Secondary | ICD-10-CM | POA: Diagnosis not present

## 2018-02-13 DIAGNOSIS — H01002 Unspecified blepharitis right lower eyelid: Secondary | ICD-10-CM | POA: Diagnosis not present

## 2018-02-13 DIAGNOSIS — Z961 Presence of intraocular lens: Secondary | ICD-10-CM | POA: Diagnosis not present

## 2018-02-20 ENCOUNTER — Other Ambulatory Visit: Payer: Self-pay | Admitting: Family Medicine

## 2018-02-24 ENCOUNTER — Other Ambulatory Visit: Payer: Self-pay | Admitting: Interventional Cardiology

## 2018-02-28 DIAGNOSIS — H35721 Serous detachment of retinal pigment epithelium, right eye: Secondary | ICD-10-CM | POA: Diagnosis not present

## 2018-02-28 DIAGNOSIS — H35452 Secondary pigmentary degeneration, left eye: Secondary | ICD-10-CM | POA: Diagnosis not present

## 2018-02-28 DIAGNOSIS — H35451 Secondary pigmentary degeneration, right eye: Secondary | ICD-10-CM | POA: Diagnosis not present

## 2018-02-28 DIAGNOSIS — Z961 Presence of intraocular lens: Secondary | ICD-10-CM | POA: Diagnosis not present

## 2018-02-28 DIAGNOSIS — H353112 Nonexudative age-related macular degeneration, right eye, intermediate dry stage: Secondary | ICD-10-CM | POA: Diagnosis not present

## 2018-02-28 DIAGNOSIS — H43811 Vitreous degeneration, right eye: Secondary | ICD-10-CM | POA: Diagnosis not present

## 2018-02-28 DIAGNOSIS — H43812 Vitreous degeneration, left eye: Secondary | ICD-10-CM | POA: Diagnosis not present

## 2018-02-28 DIAGNOSIS — H35363 Drusen (degenerative) of macula, bilateral: Secondary | ICD-10-CM | POA: Diagnosis not present

## 2018-03-06 ENCOUNTER — Other Ambulatory Visit: Payer: Self-pay | Admitting: Interventional Cardiology

## 2018-03-12 ENCOUNTER — Ambulatory Visit (INDEPENDENT_AMBULATORY_CARE_PROVIDER_SITE_OTHER): Payer: Medicare Other | Admitting: Family Medicine

## 2018-03-12 ENCOUNTER — Ambulatory Visit (HOSPITAL_BASED_OUTPATIENT_CLINIC_OR_DEPARTMENT_OTHER)
Admission: RE | Admit: 2018-03-12 | Discharge: 2018-03-12 | Disposition: A | Discharge status: Home / Self Care | Payer: Medicare Other | Source: Ambulatory Visit | Attending: Family Medicine | Admitting: Family Medicine

## 2018-03-12 ENCOUNTER — Encounter: Payer: Self-pay | Admitting: Family Medicine

## 2018-03-12 DIAGNOSIS — E782 Mixed hyperlipidemia: Secondary | ICD-10-CM

## 2018-03-12 DIAGNOSIS — M545 Low back pain, unspecified: Secondary | ICD-10-CM

## 2018-03-12 DIAGNOSIS — M25551 Pain in right hip: Secondary | ICD-10-CM

## 2018-03-12 DIAGNOSIS — I10 Essential (primary) hypertension: Secondary | ICD-10-CM

## 2018-03-12 DIAGNOSIS — R739 Hyperglycemia, unspecified: Secondary | ICD-10-CM | POA: Diagnosis not present

## 2018-03-12 DIAGNOSIS — M109 Gout, unspecified: Secondary | ICD-10-CM

## 2018-03-12 LAB — COMPREHENSIVE METABOLIC PANEL WITH GFR
ALT: 10 U/L (ref 0–35)
AST: 13 U/L (ref 0–37)
Albumin: 3.8 g/dL (ref 3.5–5.2)
Alkaline Phosphatase: 85 U/L (ref 39–117)
BUN: 14 mg/dL (ref 6–23)
CO2: 28 meq/L (ref 19–32)
Calcium: 9.4 mg/dL (ref 8.4–10.5)
Chloride: 103 meq/L (ref 96–112)
Creatinine, Ser: 0.78 mg/dL (ref 0.40–1.20)
GFR: 71.38 mL/min
Glucose, Bld: 88 mg/dL (ref 70–99)
Potassium: 4.4 meq/L (ref 3.5–5.1)
Sodium: 140 meq/L (ref 135–145)
Total Bilirubin: 0.4 mg/dL (ref 0.2–1.2)
Total Protein: 6.3 g/dL (ref 6.0–8.3)

## 2018-03-12 LAB — LIPID PANEL
Cholesterol: 224 mg/dL — ABNORMAL HIGH (ref 0–200)
HDL: 54.8 mg/dL (ref >39.00)
LDL Cholesterol: 142 mg/dL — ABNORMAL HIGH (ref 0–99)
NonHDL: 169.37
Total CHOL/HDL Ratio: 4
Triglycerides: 135 mg/dL (ref 0.0–149.0)
VLDL: 27 mg/dL (ref 0.0–40.0)

## 2018-03-12 LAB — CBC
HCT: 40.5 % (ref 36.0–46.0)
Hemoglobin: 13.3 g/dL (ref 12.0–15.0)
MCHC: 32.9 g/dL (ref 30.0–36.0)
MCV: 93.2 fl (ref 78.0–100.0)
Platelets: 370 10*3/uL (ref 150.0–400.0)
RBC: 4.35 Mil/uL (ref 3.87–5.11)
RDW: 15.2 % (ref 11.5–15.5)
WBC: 7.9 10*3/uL (ref 4.0–10.5)

## 2018-03-12 LAB — URIC ACID: Uric Acid, Serum: 6.9 mg/dL (ref 2.4–7.0)

## 2018-03-12 LAB — HEMOGLOBIN A1C: HEMOGLOBIN A1C: 5.8 % (ref 4.6–6.5)

## 2018-03-12 LAB — TSH: TSH: 2.42 u[IU]/mL (ref 0.35–4.50)

## 2018-03-12 MED ORDER — DILTIAZEM HCL ER BEADS 180 MG PO CP24: 180.0000 mg | ORAL_CAPSULE | Freq: Every day | ORAL | 2 refills | Status: DC

## 2018-03-12 NOTE — Patient Instructions (Signed)

## 2018-03-18 DIAGNOSIS — M25551 Pain in right hip: Secondary | ICD-10-CM | POA: Insufficient documentation

## 2018-03-18 NOTE — Assessment & Plan Note (Signed)
hgba1c acceptable, minimize simple carbs. Increase exercise as tolerated.  

## 2018-03-18 NOTE — Assessment & Plan Note (Signed)
Encouraged DASH diet, decrease po intake and increase exercise as tolerated. Needs 7-8 hours of sleep nightly. Avoid trans fats, eat small, frequent meals every 4-5 hours with lean proteins, complex carbs and healthy fats. Minimize simple carbs 

## 2018-03-18 NOTE — Progress Notes (Signed)
Subjective:    Patient ID: Markelle Asaro, female    DOB: 1940-08-10, 78 y.o.   MRN: [id number]  No chief complaint on file.   HPI Patient is in today for follow up follow-up.  Overall she is doing well but she is struggling with fatigue and pain.  Has been having a lot of trouble with arthritic pain and stiffness in her hands worse in the right hand than the left hand.  She also notes intermittent low back pain and persistent right hip pain.  No recent falls or trauma.  No incontinence. Denies CP/palp/SOB/HA/congestion/fevers/GI or GU c/o. Taking meds as prescribed  Past Medical History:  Diagnosis Date  . Arthritis   . Asthma    childhood now returning  . Back pain affecting pregnancy 11/02/2014  . Benign essential tremor 10/28/2014  . Bilateral dry eyes   . Breast cancer ([place])    right  . Cancer ([facility])    Breast  . Dry eyes   . Encounter for Medicare annual wellness exam 05/22/2016  . Essential hypertension, benign 10/28/2014  . H/O measles   . H/O mumps   . History of chicken pox   . Lumbago 11/02/2014  . Mixed hyperlipidemia 10/28/2014  . Obesity 10/28/2014    Past Surgical History:  Procedure Laterality Date  . BACK SURGERY    . BREAST REDUCTION SURGERY Left   . CHOLECYSTECTOMY    . EYE SURGERY  2008   b/l cataracts removed, in [place]  . MASTECTOMY Right   . REDUCTION MAMMAPLASTY    . REPLACEMENT TOTAL KNEE BILATERAL Bilateral   . ROTATOR CUFF REPAIR Left     Family History  Problem Relation Age of Onset  . Heart disease Mother   . Heart attack Mother   . Heart failure Mother   . Hypertension Mother   . Cancer Father        colon  . Asthma Father   . Parkinson's disease Father   . Cancer Maternal Grandmother        uterine  . Heart disease Maternal Grandfather   . Obesity Daughter   . Appendicitis Paternal Grandmother   . Stroke Neg Hx     Social History   Socioeconomic History  . Marital status: Married    Spouse name: Not on file  . Number of  children: Not on file  . Years of education: Not on file  . Highest education level: Not on file  Occupational History  . 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 Financial resource strain: Not on file  . Food insecurity:    Worry: Not on file    Inability: Not on file  . Transportation needs:    Medical: Not on file    Non-medical: Not on file  Tobacco Use  . Smoking status: Never Smoker  . Smokeless tobacco: Never Used  Substance and Sexual Activity  . Alcohol use: Yes    Alcohol/week: 0.0 standard drinks    Comment: two times a month  . Drug use: No  . Sexual activity: Not on file    Comment: lives with husband, moved [place], no dietary restrictions, retired Education officer, [employer]  Lifestyle  . Physical activity:    Days per week: Not on file    Minutes per session: Not on file  . Stress: Not on file  Relationships  . Social connections:    Talks on phone: Not on file    Gets  together: Not on file    Attends religious service: Not on file    Active member of club or organization: Not on file    Attends meetings of clubs or organizations: Not on file    Relationship status: Not on file  . Intimate partner violence:    Fear of current or ex partner: Not on file    Emotionally abused: Not on file    Physically abused: Not on file    Forced sexual activity: Not on file  Other Topics Concern  . Not on file  Social History Narrative  . Not on file    Outpatient Medications Prior to Visit  Medication Sig Dispense Refill  . albuterol (PROVENTIL HFA;VENTOLIN HFA) 108 (90 Base) MCG/ACT inhaler INHALE 2 PUFFS INTO THE LUNGS EVERY 4 (FOUR) HOURS AS NEEDED FOR WHEEZING OR SHORTNESS OF BREATH. 6.7 Inhaler 1  . aspirin 81 MG tablet Take 81 mg by mouth daily.    . Azelastine-Fluticasone 137-50 MCG/ACT SUSP 1 spray per nostril twice daily for runny nose 1 Bottle 5  . fluticasone (FLONASE) 50 MCG/ACT nasal spray USE 2 SPRAYS IN EACH NOSTRIL AT BEDTIME  11  .  fluticasone (FLOVENT HFA) 110 MCG/ACT inhaler 2 puffs twice daily to prevent coughing or wheezing. 1 Inhaler 12  . furosemide (LASIX) 40 MG tablet TAKE 1 TABLET (40 MG TOTAL) DAILY BY MOUTH. 90 tablet 3  . KLOR-CON M20 20 MEQ tablet TAKE 20 MEQ ( 1 TABLET) DAILY BY MOUTH. 90 tablet 3  . montelukast (SINGULAIR) 10 MG tablet TAKE 1 TABLET BY MOUTH EVERY DAY NIGHTLY 30 tablet 11  . Multiple Vitamins-Minerals (ICAPS AREDS 2) CAPS Take 1 capsule by mouth 2 (two) times daily.    Marland Kitchen PREVACID 30 MG capsule TAKE 1 CAPSULE BY MOUTH TWICE A DAY --MAX LIMIT 60 capsule 5  . propranolol (INDERAL) 20 MG tablet Take 1 tablet (20 mg total) by mouth 2 (two) times daily. 180 tablet 3  . Red Yeast Rice Extract (RED YEAST RICE PO) Take by mouth daily.    Marland Kitchen diltiazem (TIAZAC) 180 MG 24 hr capsule Take 1 capsule (180 mg total) by mouth daily. 90 capsule 2  . allopurinol (ZYLOPRIM) 100 MG tablet Take 100 mg by mouth daily.  1   No facility-administered medications prior to visit.     Allergies  Allergen Reactions  . Amoxicillin Rash  . Ampicillin Rash  . Penicillins Rash  . Statins Other (See Comments)    Per reports muscle aches and joint pain    Review of Systems  Constitutional: Negative for fever and malaise/fatigue.  HENT: Negative for congestion.   Eyes: Negative for blurred vision.  Respiratory: Negative for shortness of breath.   Cardiovascular: Negative for chest pain, palpitations and leg swelling.  Gastrointestinal: Negative for abdominal pain, blood in stool and nausea.  Genitourinary: Negative for dysuria and frequency.  Musculoskeletal: Positive for back pain and joint pain. Negative for falls.  Skin: Negative for rash.  Neurological: Negative for dizziness, loss of consciousness and headaches.  Endo/Heme/Allergies: Negative for environmental allergies.  Psychiatric/Behavioral: Negative for depression. The patient is not nervous/anxious.        Objective:    Physical Exam Vitals signs  and nursing note reviewed.  Constitutional:      General: She is not in acute distress.    Appearance: She is well-developed.  HENT:     Head: Normocephalic and atraumatic.     Nose: Nose normal.  Eyes:  General:        Right eye: No discharge.        Left eye: No discharge.  Neck:     Musculoskeletal: Normal range of motion and neck supple.  Cardiovascular:     Rate and Rhythm: Normal rate and regular rhythm.     Heart sounds: No murmur.  Pulmonary:     Effort: Pulmonary effort is normal.     Breath sounds: Normal breath sounds.  Abdominal:     General: Bowel sounds are normal.     Palpations: Abdomen is soft.     Tenderness: There is no abdominal tenderness.  Skin:    General: Skin is warm and dry.  Neurological:     Mental Status: She is alert and oriented to person, place, and time.     BP 140/76   Pulse 70   Temp 97.7 F (36.5 C) (Oral)   Resp 18   Wt 240 lb 9.6 oz (109.1 kg)   SpO2 98%   BMI 46.99 kg/m  Wt Readings from Last 3 Encounters:  03/12/18 240 lb 9.6 oz (109.1 kg)  01/18/18 241 lb 12.8 oz (109.7 kg)  12/20/17 240 lb 12.8 oz (109.2 kg)     Lab Results  Component Value Date   WBC 7.9 03/12/2018   HGB 13.3 03/12/2018   HCT 40.5 03/12/2018   PLT 370.0 03/12/2018   GLUCOSE 88 03/12/2018   CHOL 224 (H) 03/12/2018   TRIG 135.0 03/12/2018   HDL 54.80 03/12/2018   LDLDIRECT 115.0 10/27/2016   LDLCALC 142 (H) 03/12/2018   ALT 10 03/12/2018   AST 13 03/12/2018   NA 140 03/12/2018   K 4.4 03/12/2018   CL 103 03/12/2018   CREATININE 0.78 03/12/2018   BUN 14 03/12/2018   CO2 28 03/12/2018   TSH 2.42 03/12/2018   HGBA1C 5.8 03/12/2018    Lab Results  Component Value Date   TSH 2.42 03/12/2018   Lab Results  Component Value Date   WBC 7.9 03/12/2018   HGB 13.3 03/12/2018   HCT 40.5 03/12/2018   MCV 93.2 03/12/2018   PLT 370.0 03/12/2018   Lab Results  Component Value Date   NA 140 03/12/2018   K 4.4 03/12/2018   CO2 28 03/12/2018    GLUCOSE 88 03/12/2018   BUN 14 03/12/2018   CREATININE 0.78 03/12/2018   BILITOT 0.4 03/12/2018   ALKPHOS 85 03/12/2018   AST 13 03/12/2018   ALT 10 03/12/2018   PROT 6.3 03/12/2018   ALBUMIN 3.8 03/12/2018   CALCIUM 9.4 03/12/2018   GFR 71.38 03/12/2018   Lab Results  Component Value Date   CHOL 224 (H) 03/12/2018   Lab Results  Component Value Date   HDL 54.80 03/12/2018   Lab Results  Component Value Date   LDLCALC 142 (H) 03/12/2018   Lab Results  Component Value Date   TRIG 135.0 03/12/2018   Lab Results  Component Value Date   CHOLHDL 4 03/12/2018   Lab Results  Component Value Date   HGBA1C 5.8 03/12/2018       Assessment & Plan:   Problem List Items Addressed This Visit    Essential hypertension, benign    Well controlled, no changes to meds. Encouraged heart healthy diet such as the DASH diet and exercise as tolerated.       Relevant Medications   diltiazem (TIAZAC) 180 MG 24 hr capsule   Other Relevant Orders   CBC (Completed)   Comprehensive  metabolic panel (Completed)   TSH (Completed)   Mixed hyperlipidemia   Relevant Medications   diltiazem (TIAZAC) 180 MG 24 hr capsule   Other Relevant Orders   Lipid panel (Completed)   Back pain   Relevant Orders   DG Lumbar Spine Complete (Completed)   Hyperglycemia    hgba1c acceptable, minimize simple carbs. Increase exercise as tolerated.       Relevant Orders   Hemoglobin A1c (Completed)   Gout   Relevant Orders   Uric acid (Completed)   Morbid obesity ([place]) - Primary    Encouraged DASH diet, decrease po intake and increase exercise as tolerated. Needs 7-8 hours of sleep nightly. Avoid trans fats, eat small, frequent meals every 4-5 hours with lean proteins, complex carbs and healthy fats. Minimize simple carbs      Pain of right hip joint    With low back pain. Xray obtained. Encouraged moist heat and gentle stretching as tolerated. May try NSAIDs and prescription meds as directed and  report if symptoms worsen or seek immediate care. Consider referral if worsens      Relevant Orders   DG HIPS BILAT W OR W/O PELVIS 2V (Completed)      I am having Shawnee Knapp maintain her Red Yeast Rice Extract (RED YEAST RICE PO), aspirin, ICAPS AREDS 2, fluticasone, montelukast, propranolol, allopurinol, fluticasone, Azelastine-Fluticasone, albuterol, PREVACID, furosemide, KLOR-CON M20, and diltiazem.  Meds ordered this encounter  Medications  . diltiazem (TIAZAC) 180 MG 24 hr capsule    Sig: Take 1 capsule (180 mg total) by mouth daily.    Dispense:  90 capsule    Refill:  2     [name], MD

## 2018-03-18 NOTE — Assessment & Plan Note (Signed)
Well controlled, no changes to meds. Encouraged heart healthy diet such as the DASH diet and exercise as tolerated.  °

## 2018-03-18 NOTE — Assessment & Plan Note (Signed)
With low back pain. Xray obtained. Encouraged moist heat and gentle stretching as tolerated. May try NSAIDs and prescription meds as directed and report if symptoms worsen or seek immediate care. Consider referral if worsens

## 2018-04-07 ENCOUNTER — Other Ambulatory Visit: Payer: Self-pay | Admitting: Allergy and Immunology

## 2018-04-25 ENCOUNTER — Ambulatory Visit: Payer: Medicare Other | Admitting: Allergy and Immunology

## 2018-07-03 ENCOUNTER — Other Ambulatory Visit: Payer: Self-pay

## 2018-07-03 ENCOUNTER — Ambulatory Visit (INDEPENDENT_AMBULATORY_CARE_PROVIDER_SITE_OTHER): Payer: Medicare Other | Admitting: Family Medicine

## 2018-07-03 DIAGNOSIS — Z7189 Other specified counseling: Secondary | ICD-10-CM | POA: Diagnosis not present

## 2018-07-03 DIAGNOSIS — I10 Essential (primary) hypertension: Secondary | ICD-10-CM | POA: Diagnosis not present

## 2018-07-03 DIAGNOSIS — R739 Hyperglycemia, unspecified: Secondary | ICD-10-CM

## 2018-07-03 DIAGNOSIS — E782 Mixed hyperlipidemia: Secondary | ICD-10-CM | POA: Diagnosis not present

## 2018-07-03 DIAGNOSIS — J4540 Moderate persistent asthma, uncomplicated: Secondary | ICD-10-CM | POA: Diagnosis not present

## 2018-07-03 NOTE — Assessment & Plan Note (Signed)
Continue quarantine and mask and wash hands. Add Zinc 50 mg daily, Vitamin C 500 mg twice daily. And add deep breathing

## 2018-07-03 NOTE — Assessment & Plan Note (Signed)
hgba1c acceptable, minimize simple carbs. Increase exercise as tolerated.  

## 2018-07-03 NOTE — Assessment & Plan Note (Signed)
no changes to meds. Encouraged heart healthy diet such as the DASH diet and exercise as tolerated. Patient encouraged to check vitals weekly and call us if any concerns

## 2018-07-03 NOTE — Assessment & Plan Note (Signed)
Encouraged heart healthy diet, increase exercise, avoid trans fats, consider a krill oil cap daily 

## 2018-07-03 NOTE — Progress Notes (Signed)
Virtual Visit via Telephone Note  I connected with [name] on 07/03/18 at  1:00 PM EDT by a telephone enabled telemedicine application and verified that I am speaking with the correct person using two identifiers.[name], CMA tried to get patient set up on video visit but ws unsuccessful so visit completed via telephone.   Location: Patient: home Provider: home   I discussed the limitations of evaluation and management by telemedicine and the availability of in person appointments. The patient expressed understanding and agreed to proceed.    Subjective:    Patient ID: Amanda Copeland, female    DOB: 1940-08-17, 78 y.o.   MRN: [id number]  No chief complaint on file.   HPI Patient is in today for follow up on chronic medical concerns including hypertension, hyperlipidemia, asthma, hyperglycemia and more. She feels well today. She is maintaining quarantine and has not had any recent febrile illness or hospitalizations. No polyuria or polydipsia noted. Denies CP/palp/SOB/HA/congestion/fevers/GI or GU c/o. Taking meds as prescribed  Past Medical History:  Diagnosis Date  . Arthritis   . Asthma    childhood now returning  . Back pain affecting pregnancy 11/02/2014  . Benign essential tremor 10/28/2014  . Bilateral dry eyes   . Breast cancer ([place])    right  . Cancer ([facility])    Breast  . Dry eyes   . Encounter for Medicare annual wellness exam 05/22/2016  . Essential hypertension, benign 10/28/2014  . H/O measles   . H/O mumps   . History of chicken pox   . Lumbago 11/02/2014  . Mixed hyperlipidemia 10/28/2014  . Obesity 10/28/2014    Past Surgical History:  Procedure Laterality Date  . BACK SURGERY    . BREAST REDUCTION SURGERY Left   . CHOLECYSTECTOMY    . EYE SURGERY  2008   b/l cataracts removed, in [place]  . MASTECTOMY Right   . REDUCTION MAMMAPLASTY    . REPLACEMENT TOTAL KNEE BILATERAL Bilateral   . ROTATOR CUFF REPAIR Left     Family History  Problem  Relation Age of Onset  . Heart disease Mother   . Heart attack Mother   . Heart failure Mother   . Hypertension Mother   . Cancer Father        colon  . Asthma Father   . Parkinson's disease Father   . Cancer Maternal Grandmother        uterine  . Heart disease Maternal Grandfather   . Obesity Daughter   . Appendicitis Paternal Grandmother   . Stroke Neg Hx     Social History   Socioeconomic History  . Marital status: Married    Spouse name: Not on file  . Number of children: Not on file  . Years of education: Not on file  . Highest education level: Not on file  Occupational History  . 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 Financial resource strain: Not on file  . Food insecurity:    Worry: Not on file    Inability: Not on file  . Transportation needs:    Medical: Not on file    Non-medical: Not on file  Tobacco Use  . Smoking status: Never Smoker  . Smokeless tobacco: Never Used  Substance and Sexual Activity  . Alcohol use: Yes    Alcohol/week: 0.0 standard drinks    Comment: two times a month  . Drug use: No  . Sexual activity: Not on file  Comment: lives with husband, moved [place], no dietary restrictions, retired Education officer, museum  Lifestyle  . Physical activity:    Days per week: Not on file    Minutes per session: Not on file  . Stress: Not on file  Relationships  . Social connections:    Talks on phone: Not on file    Gets together: Not on file    Attends religious service: Not on file    Active member of club or organization: Not on file    Attends meetings of clubs or organizations: Not on file    Relationship status: Not on file  . Intimate partner violence:    Fear of current or ex partner: Not on file    Emotionally abused: Not on file    Physically abused: Not on file    Forced sexual activity: Not on file  Other Topics Concern  . Not on file  Social History Narrative  . Not on file    Outpatient  Medications Prior to Visit  Medication Sig Dispense Refill  . albuterol (PROVENTIL HFA;VENTOLIN HFA) 108 (90 Base) MCG/ACT inhaler INHALE 2 PUFFS INTO THE LUNGS EVERY 4 (FOUR) HOURS AS NEEDED FOR WHEEZING OR SHORTNESS OF BREATH. 6.7 Inhaler 1  . allopurinol (ZYLOPRIM) 100 MG tablet Take 100 mg by mouth daily.  1  . aspirin 81 MG tablet Take 81 mg by mouth daily.    . Azelastine-Fluticasone 137-50 MCG/ACT SUSP 1 spray per nostril twice daily for runny nose 1 Bottle 5  . diltiazem (TIAZAC) 180 MG 24 hr capsule Take 1 capsule (180 mg total) by mouth daily. 90 capsule 2  . fluticasone (FLONASE) 50 MCG/ACT nasal spray USE 2 SPRAYS IN EACH NOSTRIL AT BEDTIME  11  . fluticasone (FLOVENT HFA) 110 MCG/ACT inhaler 2 puffs twice daily to prevent coughing or wheezing. 1 Inhaler 12  . furosemide (LASIX) 40 MG tablet TAKE 1 TABLET (40 MG TOTAL) DAILY BY MOUTH. 90 tablet 3  . KLOR-CON M20 20 MEQ tablet TAKE 20 MEQ ( 1 TABLET) DAILY BY MOUTH. 90 tablet 3  . montelukast (SINGULAIR) 10 MG tablet TAKE 1 TABLET BY MOUTH EVERY DAY NIGHTLY 30 tablet 11  . Multiple Vitamins-Minerals (ICAPS AREDS 2) CAPS Take 1 capsule by mouth 2 (two) times daily.    Marland Kitchen PREVACID 30 MG capsule TAKE 1 CAPSULE BY MOUTH TWICE A DAY --MAX LIMIT 60 capsule 5  . propranolol (INDERAL) 20 MG tablet Take 1 tablet (20 mg total) by mouth 2 (two) times daily. 180 tablet 3  . Red Yeast Rice Extract (RED YEAST RICE PO) Take by mouth daily.     No facility-administered medications prior to visit.     Allergies  Allergen Reactions  . Amoxicillin Rash  . Ampicillin Rash  . Penicillins Rash  . Statins Other (See Comments)    Per reports muscle aches and joint pain    Review of Systems  Constitutional: Negative for fever and malaise/fatigue.  HENT: Negative for congestion.   Eyes: Negative for blurred vision.  Respiratory: Negative for shortness of breath.   Cardiovascular: Negative for chest pain, palpitations and leg swelling.   Gastrointestinal: Negative for abdominal pain, blood in stool and nausea.  Genitourinary: Negative for dysuria and frequency.  Musculoskeletal: Negative for falls.  Skin: Negative for rash.  Neurological: Negative for dizziness, loss of consciousness and headaches.  Endo/Heme/Allergies: Negative for environmental allergies.  Psychiatric/Behavioral: Negative for depression. The patient is not nervous/anxious.        Objective:  Physical Exam Unable to obtain via telephone visit.   There were no vitals taken for this visit. Wt Readings from Last 3 Encounters:  03/12/18 240 lb 9.6 oz (109.1 kg)  01/18/18 241 lb 12.8 oz (109.7 kg)  12/20/17 240 lb 12.8 oz (109.2 kg)    Diabetic Foot Exam - Simple   No data filed     Lab Results  Component Value Date   WBC 7.9 03/12/2018   HGB 13.3 03/12/2018   HCT 40.5 03/12/2018   PLT 370.0 03/12/2018   GLUCOSE 88 03/12/2018   CHOL 224 (H) 03/12/2018   TRIG 135.0 03/12/2018   HDL 54.80 03/12/2018   LDLDIRECT 115.0 10/27/2016   LDLCALC 142 (H) 03/12/2018   ALT 10 03/12/2018   AST 13 03/12/2018   NA 140 03/12/2018   K 4.4 03/12/2018   CL 103 03/12/2018   CREATININE 0.78 03/12/2018   BUN 14 03/12/2018   CO2 28 03/12/2018   TSH 2.42 03/12/2018   HGBA1C 5.8 03/12/2018    Lab Results  Component Value Date   TSH 2.42 03/12/2018   Lab Results  Component Value Date   WBC 7.9 03/12/2018   HGB 13.3 03/12/2018   HCT 40.5 03/12/2018   MCV 93.2 03/12/2018   PLT 370.0 03/12/2018   Lab Results  Component Value Date   NA 140 03/12/2018   K 4.4 03/12/2018   CO2 28 03/12/2018   GLUCOSE 88 03/12/2018   BUN 14 03/12/2018   CREATININE 0.78 03/12/2018   BILITOT 0.4 03/12/2018   ALKPHOS 85 03/12/2018   AST 13 03/12/2018   ALT 10 03/12/2018   PROT 6.3 03/12/2018   ALBUMIN 3.8 03/12/2018   CALCIUM 9.4 03/12/2018   GFR 71.38 03/12/2018   Lab Results  Component Value Date   CHOL 224 (H) 03/12/2018   Lab Results  Component Value  Date   HDL 54.80 03/12/2018   Lab Results  Component Value Date   LDLCALC 142 (H) 03/12/2018   Lab Results  Component Value Date   TRIG 135.0 03/12/2018   Lab Results  Component Value Date   CHOLHDL 4 03/12/2018   Lab Results  Component Value Date   HGBA1C 5.8 03/12/2018       Assessment & Plan:   Problem List Items Addressed This Visit    Moderate persistent asthma    Doing well and quarantining       Essential hypertension, benign    no changes to meds. Encouraged heart healthy diet such as the DASH diet and exercise as tolerated. Patient encouraged to check vitals weekly and call us if any concerns      Mixed hyperlipidemia    Encouraged heart healthy diet, increase exercise, avoid trans fats, consider a krill oil cap daily      Hyperglycemia    hgba1c acceptable, minimize simple carbs. Increase exercise as tolerated.      Educated About Covid-19 Virus Infection    Continue quarantine and mask and wash hands. Add Zinc 50 mg daily, Vitamin C 500 mg twice daily. And add deep breathing          I am having Shawnee Knapp maintain her Red Yeast Rice Extract (RED YEAST RICE PO), aspirin, ICaps Areds 2, fluticasone, montelukast, propranolol, allopurinol, fluticasone, Azelastine-Fluticasone, Prevacid, furosemide, Klor-Con M20, diltiazem, and albuterol.  No orders of the defined types were placed in this encounter. I discussed the assessment and treatment plan with the patient. The patient was provided an opportunity to ask questions  and all were answered. The patient agreed with the plan and demonstrated an understanding of the instructions.   The patient was advised to call back or seek an in-person evaluation if the symptoms worsen or if the condition fails to improve as anticipated.  I provided 25 minutes of non-face-to-face time during this encounter.   [name], MD

## 2018-07-03 NOTE — Assessment & Plan Note (Signed)
Doing well and quarantining

## 2018-09-03 DIAGNOSIS — H353121 Nonexudative age-related macular degeneration, left eye, early dry stage: Secondary | ICD-10-CM | POA: Diagnosis not present

## 2018-09-03 DIAGNOSIS — H35453 Secondary pigmentary degeneration, bilateral: Secondary | ICD-10-CM | POA: Diagnosis not present

## 2018-09-03 DIAGNOSIS — H43813 Vitreous degeneration, bilateral: Secondary | ICD-10-CM | POA: Diagnosis not present

## 2018-09-03 DIAGNOSIS — H353112 Nonexudative age-related macular degeneration, right eye, intermediate dry stage: Secondary | ICD-10-CM | POA: Diagnosis not present

## 2018-09-03 DIAGNOSIS — H35363 Drusen (degenerative) of macula, bilateral: Secondary | ICD-10-CM | POA: Diagnosis not present

## 2018-09-11 ENCOUNTER — Other Ambulatory Visit: Payer: Self-pay | Admitting: Allergy and Immunology

## 2018-09-11 NOTE — Telephone Encounter (Signed)
Gave 1 courtesy refill of dymista. Pt needs ov for additional refills.

## 2018-09-14 NOTE — Progress Notes (Signed)
Subjective:   Amanda Copeland was seen in consultation in the movement disorder clinic at the request of [name], MD.  The evaluation is for tremor.  This patient is accompanied in the office by her spouse who supplements the history.   The patient is a 78 y.o. right handed female with a history of tremor.  The patient reports that she has had tremor in both hands for the last several years.  She went to a neurologist in [place] and was diagnosed with ET and was put on propranolol, 20 mg bid and it helped a lot without side effects.  There is a family hx of tremor in her father but he has PD.  There is no other family hx of tremor    Affected by caffeine:  No. (coffee is only decaf) Affected by alcohol:  unknown Affected by stress:  No. Affected by fatigue:  No. Spills soup if on spoon:  No. (not now that she is on the propranolol) Spills glass of liquid if full:  No. Affects ADL's (tying shoes, brushing teeth, etc):  No.   Outside reports reviewed: historical medical records.  10/09/15 update:  Pt returns for f/u re: ET.  The records that were made available to me were reviewed.   Reports that she is doing well on propranolol 20 mg bid.  Rarely she will forget the night pill and "i can tell in the AM."  No SE with medication.  She is under tons of stress.  Her son in law was dx with a bone cancer and he is getting aggressive chemo and needs an amputation.  Her mother in law is dying in [place].  She is helping to take care of the grandchildren.    04/11/16 update:  Patient returns today for follow-up.  She is on propranolol, 20 mg twice per day.  overall, she states that her tremor has been well controlled.  she denies falls.  denies lightheadedness or near syncope.  No new medical problems.  She states that she can tell if she skips medication.    10/12/16 update:  Patient seen today in follow-up for her essential tremor.  She was on propranolol, 20 mg twice per day but she saw Dr. Melvyn Novas  and states that she was changed to metoprolol instead (to see if her asthma would be better).  She is now on metoprolol 25 mg bid.  She thinks that tremor may be "coming back a little."   Breathing hasn't changed since making the change. I have reviewed records since last visit.  She has been to rehabilitation for low back pain.  She has followed up with Dr. Melvyn Novas in regards to asthma, as above.   She does ask if she needs all 3 blood pressure meds.  She denies hx of CHF or LE edema.    03/16/17 update: Patient is seen today in follow-up.  I have reviewed records and corresponded with her pulmonologist regarding her case.  Dr. Melvyn Novas asked me to take her off of propranolol.  Initially, I was somewhat confused as she was off of propranolol last visit, but it turns out she went back on the medication and it is felt that this is worsening her asthma, as evidenced by PFTs.  Had 2 falls since last visit.  With one she got up in the middle of the night and felt lightheaded and then fell.  With the other she was at movies and was coming down stairs and fell on  her butt.  05/08/17 update: Patient seen today in follow-up.  At our last visit, her pulmonologist had recommended discontinuation of her propranolol due to her asthma.  She called after this was done and stated that her tremor was much worse.  Primidone was increased to 50 mg twice per day.  She emailed me about a week later when she was on a cruise and stated that she was not tolerating the primidone well (made her sleep so deeply that she wet the bed).  She was not enjoying the cruise because of it.  She went to go back on the propranolol.  I told her she could go back on it for the cruise.  Reports today that she is still on the propranolol but she didn't know how much, but thinks it is 20 mg bid.  She reports tremor is worse.  States that she isn't coughing and lungs are "fine."   She is already on gabapentin.  She doesn't know why she is on that.  Reports some  twitching and jerking in the legs.  She went off of the primidone when she went off of the propranolol.   07/05/17 update: Patient is seen today in follow-up.  She is on propranolol, 20 mg twice per day.  Went to pulm yesterday and told that she shouldn't increase propranolol.  She was started on topiramate last visit as an add-on medication.  She reports that vocal tremor is worse and she is now having leg tremor that seems to interfere with walking.  She reports that her legs are "all over the place."  Legs are "jerky" and they feel week.  Legs are more "jerky" than tremulous.  Only on gabapentin for a few months.   She thinks that topamax has been helpful    09/15/17 update: Patient is seen today in follow-up for tremor.  Records have been reviewed since our last visit.  She is still on propranolol, 20 mg twice per day.  Tremor is well controlled with propranolol and she is pleased.  Last visit, the patient was complaining of what sounded like myoclonus.  I asked her to hold her gabapentin and we called her several weeks later to see how she was doing.  She stated that the jerking was gone off of gabapentin.  She emailed me in mid July to state that she wanted to stop the Topamax.  For some reason, she thought that was preventing her from jerking, even though it was the discontinuation of the gabapentin.  She felt Topamax is making her tired and fatigued and causing double vision.  This medication was weaned and discontinued.  She still feels that she has taste aversion and loss of hair.  No falls.  No new med problems.  Going to PT for balance and it is helping.  Getting ready to go on a 1 month to her/vacation.  They will have a lot walking.  09/18/18 update: Patient seen today in follow-up for tremor.  She is on propranolol, 20 mg twice per day.  Patient is doing well with this.  She has a little more tremor if she holds something of weight, like her iPad, but it is not that bothersome to her.  She has had  no lightheadedness or near syncope.  No falls.  Records have been reviewed since last visit.  She is now seeing Dr. Verlin Fester of allergy and immunology for her lungs.  Reports that she is no longer seeing Dr. Melvyn Novas.  Current/Previously tried tremor  medications: propranolol (felt worsened asthma); metoprolol (didn't help); primidone (made too sleepy and didn't help at 100 mg daily)  Current medications that may exacerbate tremor:  Albuterol (doesn't notice that it affects tremor); symbicort  Allergies  Allergen Reactions  . Amoxicillin Rash  . Ampicillin Rash  . Penicillins Rash  . Statins Other (See Comments)    Per reports muscle aches and joint pain    Outpatient Encounter Medications as of 09/18/2018  Medication Sig  . albuterol (PROVENTIL HFA;VENTOLIN HFA) 108 (90 Base) MCG/ACT inhaler INHALE 2 PUFFS INTO THE LUNGS EVERY 4 (FOUR) HOURS AS NEEDED FOR WHEEZING OR SHORTNESS OF BREATH.  Marland Kitchen allopurinol (ZYLOPRIM) 100 MG tablet Take 100 mg by mouth daily.  Marland Kitchen aspirin 81 MG tablet Take 81 mg by mouth daily.  . Azelastine-Fluticasone 137-50 MCG/ACT SUSP 1 SPRAY PER NOSTRIL TWICE DAILY FOR RUNNY NOSE  . diltiazem (TIAZAC) 180 MG 24 hr capsule Take 1 capsule (180 mg total) by mouth daily.  . fluticasone (FLONASE) 50 MCG/ACT nasal spray USE 2 SPRAYS IN EACH NOSTRIL AT BEDTIME  . fluticasone (FLOVENT HFA) 110 MCG/ACT inhaler 2 puffs twice daily to prevent coughing or wheezing.  Marland Kitchen KLOR-CON M20 20 MEQ tablet TAKE 20 MEQ ( 1 TABLET) DAILY BY MOUTH.  . montelukast (SINGULAIR) 10 MG tablet TAKE 1 TABLET BY MOUTH EVERY DAY NIGHTLY  . Multiple Vitamins-Minerals (ICAPS AREDS 2) CAPS Take 1 capsule by mouth 2 (two) times daily.  Marland Kitchen PREVACID 30 MG capsule TAKE 1 CAPSULE BY MOUTH TWICE A DAY --MAX LIMIT  . propranolol (INDERAL) 20 MG tablet Take 1 tablet (20 mg total) by mouth 2 (two) times daily.  . Red Yeast Rice Extract (RED YEAST RICE PO) Take by mouth daily.  . furosemide (LASIX) 40 MG tablet TAKE 1 TABLET  (40 MG TOTAL) DAILY BY MOUTH.   No facility-administered encounter medications on file as of 09/18/2018.     Past Medical History:  Diagnosis Date  . Arthritis   . Asthma    childhood now returning  . Back pain affecting pregnancy 11/02/2014  . Benign essential tremor 10/28/2014  . Bilateral dry eyes   . Breast cancer ([place])    right  . Cancer ([facility])    Breast  . Dry eyes   . Encounter for Medicare annual wellness exam 05/22/2016  . Essential hypertension, benign 10/28/2014  . H/O measles   . H/O mumps   . History of chicken pox   . Lumbago 11/02/2014  . Mixed hyperlipidemia 10/28/2014  . Obesity 10/28/2014    Past Surgical History:  Procedure Laterality Date  . BACK SURGERY    . BREAST REDUCTION SURGERY Left   . CHOLECYSTECTOMY    . EYE SURGERY  2008   b/l cataracts removed, in [place]  . MASTECTOMY Right   . REDUCTION MAMMAPLASTY    . REPLACEMENT TOTAL KNEE BILATERAL Bilateral   . ROTATOR CUFF REPAIR Left     Social History   Socioeconomic History  . Marital status: Married    Spouse name: Not on file  . Number of children: 1  . Years of education: Not on file  . Highest education level: Master's degree (e.g., MA, MS, MEng, MEd, MSW, MBA)  Occupational History  . 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 Financial resource strain: Not on file  . Food insecurity    Worry: Not on file    Inability: Not on file  . Transportation needs  Medical: Not on file    Non-medical: Not on file  Tobacco Use  . Smoking status: Never Smoker  . Smokeless tobacco: Never Used  Substance and Sexual Activity  . Alcohol use: Yes    Alcohol/week: 0.0 standard drinks    Comment: two times a month  . Drug use: No  . Sexual activity: Not on file    Comment: lives with husband, moved [place], no dietary restrictions, retired Education officer, museum  Lifestyle  . Physical activity    Days per week: Not on file    Minutes per session: Not on file  . Stress:  Not on file  Relationships  . Social Herbalist on phone: Not on file    Gets together: Not on file    Attends religious service: Not on file    Active member of club or organization: Not on file    Attends meetings of clubs or organizations: Not on file    Relationship status: Not on file  . Intimate partner violence    Fear of current or ex partner: Not on file    Emotionally abused: Not on file    Physically abused: Not on file    Forced sexual activity: Not on file  Other Topics Concern  . Not on file  Social History Narrative   Pt lives with Spouse at home 1 daughter   Drinks coffee, and soda   Right handed    Family Status  Relation Name Status  . Mother 69 Deceased       heart disease  . Father 61 Deceased       colon cancer, Parkinson's Disease  . MGM 65ish Deceased  . MGF 45ish Deceased  . Daughter 38 Alive  . PGM 40ish Deceased  . PGF 40ish,accident Deceased  . Neg Hx  (Not Specified)    Review of Systems Review of Systems  Constitutional: Negative.   HENT: Negative.   Eyes: Negative.   Cardiovascular: Negative.   Gastrointestinal: Negative.   Genitourinary: Negative.   Skin: Negative.   Endo/Heme/Allergies: Negative.       Objective:   VITALS:   Vitals:   09/18/18 1044  BP: (!) 169/91  Pulse: 70  Temp: 98.1 F (36.7 C)  SpO2: 96%  Weight: 246 lb 12.8 oz (111.9 kg)  Height: 5\' 1"  (1.549 m)   Physical Exam  Constitutional: She is oriented to person, place, and time and well-developed, well-nourished, and in no distress.  HENT:  Head: Normocephalic and atraumatic.  Cardiovascular: Normal rate, regular rhythm and normal heart sounds.  Pulmonary/Chest: Breath sounds normal.  Neurological: She is alert and oriented to person, place, and time.    NEUROLOGICAL:  Orientation:  The patient is alert and oriented x 3.   Cranial nerves: There is good facial symmetry.  Extraocular muscles are intact and visual fields are full to  confrontational testing. Speech is fluent and clear. Soft palate rises symmetrically and there is no tongue deviation. Hearing is intact to conversational tone. Tone: Tone is good throughout. Sensation: Sensation is intact to light touch  Coordination:  The patient has no difficulty with RAM's or FNF bilaterally. Motor: Strength is 5/5 in the bilateral upper and lower extremities.   Gait and Station: The patient is able to ambulate without difficulty. She is wide based Abnormal movements:  No rest tremor.  No postural or intention tremor.  She has some mild tremor when given a weight and when the arm is held 1/2  way between pronation and supination.  Some mild trouble with Archimedes spirals on the right.   Labs:    Chemistry      Component Value Date/Time   NA 140 03/12/2018 1228   NA 145 (H) 12/23/2016 1114   K 4.4 03/12/2018 1228   CL 103 03/12/2018 1228   CO2 28 03/12/2018 1228   BUN 14 03/12/2018 1228   BUN 14 12/23/2016 1114   CREATININE 0.78 03/12/2018 1228      Component Value Date/Time   CALCIUM 9.4 03/12/2018 1228   ALKPHOS 85 03/12/2018 1228   AST 13 03/12/2018 1228   ALT 10 03/12/2018 1228   BILITOT 0.4 03/12/2018 1228     Lab Results  Component Value Date   TSH 2.42 03/12/2018        Assessment/Plan:   1.  Essential Tremor.  -Dr. Melvyn Novas noted worsening of asthma on propranolol and attempted to discontinue this, but the patient's tremor got much worse.  She went back on the propranolol, 20 mg bid.  She is no longer following with Dr. Melvyn Novas, but is following with Dr. Verlin Fester of allergy and immunology.  She is doing well on propranolol, 20 mg twice per day.  Propranolol was refilled.  She failed primidone in the past.  Topamax was not helpful and she thought it caused side effects and was discontinued.  2.  Gait instability  -In physical therapy and doing well. 3.  Myoclonus  -Resolved off of gabapentin. 4.  HTN  -BP elevated today.  States that this is unusual.   To keep an eye on this. 4.  Follow up 1 year.

## 2018-09-18 ENCOUNTER — Encounter: Payer: Self-pay | Admitting: Neurology

## 2018-09-18 ENCOUNTER — Other Ambulatory Visit: Payer: Self-pay

## 2018-09-18 ENCOUNTER — Ambulatory Visit (INDEPENDENT_AMBULATORY_CARE_PROVIDER_SITE_OTHER): Payer: Medicare Other | Admitting: Neurology

## 2018-09-18 VITALS — BP 169/91 | HR 70 | Temp 98.1°F | Ht 61.0 in | Wt 246.8 lb

## 2018-09-18 DIAGNOSIS — G250 Essential tremor: Secondary | ICD-10-CM

## 2018-09-18 MED ORDER — PROPRANOLOL HCL 20 MG PO TABS: 20.0000 mg | ORAL_TABLET | Freq: Two times a day (BID) | ORAL | 3 refills | Status: DC

## 2018-09-18 NOTE — Patient Instructions (Signed)
Let me know if tremor becomes too bothersome!   The physicians and staff at [facility] are committed to providing excellent care. You may receive a survey requesting feedback about your experience at our office. We strive to receive "very good" responses to the survey questions. If you feel that your experience would prevent you from giving the office a "very good " response, please contact our office to try to remedy the situation. We may be reached at [phone]. Thank you for taking the time out of your busy day to complete the survey.

## 2018-10-02 ENCOUNTER — Other Ambulatory Visit: Payer: Self-pay

## 2018-10-02 DIAGNOSIS — D1801 Hemangioma of skin and subcutaneous tissue: Secondary | ICD-10-CM | POA: Diagnosis not present

## 2018-10-02 DIAGNOSIS — L821 Other seborrheic keratosis: Secondary | ICD-10-CM | POA: Diagnosis not present

## 2018-10-02 DIAGNOSIS — D229 Melanocytic nevi, unspecified: Secondary | ICD-10-CM | POA: Diagnosis not present

## 2018-10-02 DIAGNOSIS — L570 Actinic keratosis: Secondary | ICD-10-CM | POA: Diagnosis not present

## 2018-10-02 DIAGNOSIS — L819 Disorder of pigmentation, unspecified: Secondary | ICD-10-CM | POA: Diagnosis not present

## 2018-10-02 DIAGNOSIS — D485 Neoplasm of uncertain behavior of skin: Secondary | ICD-10-CM | POA: Diagnosis not present

## 2018-10-08 ENCOUNTER — Other Ambulatory Visit: Payer: Self-pay | Admitting: Allergy and Immunology

## 2018-10-08 ENCOUNTER — Ambulatory Visit (HOSPITAL_BASED_OUTPATIENT_CLINIC_OR_DEPARTMENT_OTHER)
Admission: RE | Admit: 2018-10-08 | Discharge: 2018-10-08 | Disposition: A | Discharge status: Home / Self Care | Payer: Medicare Other | Source: Ambulatory Visit | Attending: Family Medicine | Admitting: Family Medicine

## 2018-10-08 ENCOUNTER — Ambulatory Visit (INDEPENDENT_AMBULATORY_CARE_PROVIDER_SITE_OTHER): Payer: Medicare Other | Admitting: Family Medicine

## 2018-10-08 ENCOUNTER — Encounter: Payer: Self-pay | Admitting: Family Medicine

## 2018-10-08 ENCOUNTER — Other Ambulatory Visit: Payer: Self-pay

## 2018-10-08 ENCOUNTER — Ambulatory Visit: Payer: Medicare Other | Admitting: Family Medicine

## 2018-10-08 VITALS — BP 128/72 | HR 71 | Temp 96.2°F | Resp 18 | Wt 246.8 lb

## 2018-10-08 DIAGNOSIS — G8929 Other chronic pain: Secondary | ICD-10-CM

## 2018-10-08 DIAGNOSIS — M160 Bilateral primary osteoarthritis of hip: Secondary | ICD-10-CM | POA: Diagnosis not present

## 2018-10-08 DIAGNOSIS — R739 Hyperglycemia, unspecified: Secondary | ICD-10-CM | POA: Diagnosis not present

## 2018-10-08 DIAGNOSIS — M109 Gout, unspecified: Secondary | ICD-10-CM

## 2018-10-08 DIAGNOSIS — E782 Mixed hyperlipidemia: Secondary | ICD-10-CM | POA: Diagnosis not present

## 2018-10-08 DIAGNOSIS — M545 Low back pain, unspecified: Secondary | ICD-10-CM

## 2018-10-08 DIAGNOSIS — I10 Essential (primary) hypertension: Secondary | ICD-10-CM

## 2018-10-08 DIAGNOSIS — J4540 Moderate persistent asthma, uncomplicated: Secondary | ICD-10-CM | POA: Diagnosis not present

## 2018-10-08 LAB — CBC
HCT: 42.8 % (ref 36.0–46.0)
Hemoglobin: 14.3 g/dL (ref 12.0–15.0)
MCHC: 33.4 g/dL (ref 30.0–36.0)
MCV: 93 fl (ref 78.0–100.0)
Platelets: 314 10*3/uL (ref 150.0–400.0)
RBC: 4.61 Mil/uL (ref 3.87–5.11)
RDW: 14.4 % (ref 11.5–15.5)
WBC: 7.6 10*3/uL (ref 4.0–10.5)

## 2018-10-08 LAB — COMPREHENSIVE METABOLIC PANEL
ALT: 8 U/L (ref 0–35)
AST: 15 U/L (ref 0–37)
Albumin: 4 g/dL (ref 3.5–5.2)
Alkaline Phosphatase: 102 U/L (ref 39–117)
BUN: 16 mg/dL (ref 6–23)
CO2: 29 mEq/L (ref 19–32)
Calcium: 9.5 mg/dL (ref 8.4–10.5)
Chloride: 103 mEq/L (ref 96–112)
Creatinine, Ser: 0.88 mg/dL (ref 0.40–1.20)
GFR: 62.01 mL/min (ref >60.00)
Glucose, Bld: 91 mg/dL (ref 70–99)
Potassium: 4.5 mEq/L (ref 3.5–5.1)
Sodium: 143 mEq/L (ref 135–145)
Total Bilirubin: 0.4 mg/dL (ref 0.2–1.2)
Total Protein: 6.7 g/dL (ref 6.0–8.3)

## 2018-10-08 LAB — HEMOGLOBIN A1C: Hgb A1c MFr Bld: 5.6 % (ref 4.6–6.5)

## 2018-10-08 LAB — LIPID PANEL
Cholesterol: 228 mg/dL — ABNORMAL HIGH (ref 0–200)
HDL: 61.4 mg/dL (ref >39.00)
LDL Cholesterol: 141 mg/dL — ABNORMAL HIGH (ref 0–99)
NonHDL: 166.85
Total CHOL/HDL Ratio: 4
Triglycerides: 128 mg/dL (ref 0.0–149.0)
VLDL: 25.6 mg/dL (ref 0.0–40.0)

## 2018-10-08 LAB — TSH: TSH: 4.55 u[IU]/mL — ABNORMAL HIGH (ref 0.35–4.50)

## 2018-10-08 LAB — URIC ACID: Uric Acid, Serum: 7.7 mg/dL — ABNORMAL HIGH (ref 2.4–7.0)

## 2018-10-08 NOTE — Assessment & Plan Note (Signed)
Check xrays. Encouraged moist heat and gentle stretching as tolerated. May try NSAIDs and prescription meds as directed and report if symptoms worsen or seek immediate care

## 2018-10-08 NOTE — Assessment & Plan Note (Signed)
Encouraged heart healthy diet, increase exercise, avoid trans fats, consider a krill oil cap daily 

## 2018-10-08 NOTE — Assessment & Plan Note (Signed)
Well controlled, no changes to meds. Encouraged heart healthy diet such as the DASH diet and exercise as tolerated.  °

## 2018-10-08 NOTE — Progress Notes (Signed)
Subjective:    Patient ID: Amanda Copeland, female    DOB: [birth year], 78 y.o.   MRN: [id number]  No chief complaint on file.   HPI Patient is in today for follow up on chronic medical concerns including hypertension, hyperlipidemia, gout, hyperglycemia and more. No recent febrile illness or hospitalizations. No polyuria or polydipsia. Is noting increased back and hip pain. No recent fall or trauma. No incontinence. Is trying to maintain a heart healthy diet. Denies CP/palp/SOB/HA/congestion/fevers/GI or GU c/o. Taking meds as prescribed  Past Medical History:  Diagnosis Date  . Arthritis   . Asthma    childhood now returning  . Back pain affecting pregnancy 11/02/2014  . Benign essential tremor 10/28/2014  . Bilateral dry eyes   . Breast cancer ([facility])    right  . Cancer ([facility])    Breast  . Dry eyes   . Encounter for Medicare annual wellness exam 05/22/2016  . Essential hypertension, benign 10/28/2014  . H/O measles   . H/O mumps   . History of chicken pox   . Lumbago 11/02/2014  . Mixed hyperlipidemia 10/28/2014  . Obesity 10/28/2014    Past Surgical History:  Procedure Laterality Date  . BACK SURGERY    . BREAST REDUCTION SURGERY Left   . CHOLECYSTECTOMY    . EYE SURGERY  2008   b/l cataracts removed, in [place]  . MASTECTOMY Right   . REDUCTION MAMMAPLASTY    . REPLACEMENT TOTAL KNEE BILATERAL Bilateral   . ROTATOR CUFF REPAIR Left     Family History  Problem Relation Age of Onset  . Heart disease Mother   . Heart attack Mother   . Heart failure Mother   . Hypertension Mother   . Cancer Father        colon  . Asthma Father   . Parkinson's disease Father   . Cancer Maternal Grandmother        uterine  . Heart disease Maternal Grandfather   . Obesity Daughter   . Appendicitis Paternal Grandmother   . Stroke Neg Hx     Social History   Socioeconomic History  . Marital status: Married    Spouse name: Not on file  . Number of children: 1  . Years of  education: Not on file  . Highest education level: Master's degree (e.g., MA, MS, MEng, MEd, MSW, MBA)  Occupational History  . 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 Financial resource strain: Not on file  . Food insecurity    Worry: Not on file    Inability: Not on file  . Transportation needs    Medical: Not on file    Non-medical: Not on file  Tobacco Use  . Smoking status: Never Smoker  . Smokeless tobacco: Never Used  Substance and Sexual Activity  . Alcohol use: Yes    Alcohol/week: 0.0 standard drinks    Comment: two times a month  . Drug use: No  . Sexual activity: Not on file    Comment: lives with husband, moved [place], no dietary restrictions, retired Education officer, [employer]  Lifestyle  . Physical activity    Days per week: Not on file    Minutes per session: Not on file  . Stress: Not on file  Relationships  . Social Herbalist on phone: Not on file    Gets together: Not on file    Attends religious service: Not on file  Active member of club or organization: Not on file    Attends meetings of clubs or organizations: Not on file    Relationship status: Not on file  . Intimate partner violence    Fear of current or ex partner: Not on file    Emotionally abused: Not on file    Physically abused: Not on file    Forced sexual activity: Not on file  Other Topics Concern  . Not on file  Social History Narrative   Pt lives with Spouse at home 1 daughter   Drinks coffee, and soda   Right handed    Outpatient Medications Prior to Visit  Medication Sig Dispense Refill  . albuterol (PROVENTIL HFA;VENTOLIN HFA) 108 (90 Base) MCG/ACT inhaler INHALE 2 PUFFS INTO THE LUNGS EVERY 4 (FOUR) HOURS AS NEEDED FOR WHEEZING OR SHORTNESS OF BREATH. 6.7 Inhaler 1  . allopurinol (ZYLOPRIM) 100 MG tablet Take 100 mg by mouth daily.  1  . aspirin 81 MG tablet Take 81 mg by mouth daily.    . Azelastine-Fluticasone 137-50 MCG/ACT SUSP 1 SPRAY PER  NOSTRIL TWICE DAILY FOR RUNNY NOSE 23 g 0  . diltiazem (TIAZAC) 180 MG 24 hr capsule Take 1 capsule (180 mg total) by mouth daily. 90 capsule 2  . fluticasone (FLONASE) 50 MCG/ACT nasal spray USE 2 SPRAYS IN EACH NOSTRIL AT BEDTIME  11  . fluticasone (FLOVENT HFA) 110 MCG/ACT inhaler 2 puffs twice daily to prevent coughing or wheezing. 1 Inhaler 12  . furosemide (LASIX) 40 MG tablet TAKE 1 TABLET (40 MG TOTAL) DAILY BY MOUTH. 90 tablet 3  . KLOR-CON M20 20 MEQ tablet TAKE 20 MEQ ( 1 TABLET) DAILY BY MOUTH. 90 tablet 3  . montelukast (SINGULAIR) 10 MG tablet TAKE 1 TABLET BY MOUTH EVERY DAY NIGHTLY 30 tablet 11  . Multiple Vitamins-Minerals (ICAPS AREDS 2) CAPS Take 1 capsule by mouth 2 (two) times daily.    Marland Kitchen PREVACID 30 MG capsule TAKE 1 CAPSULE BY MOUTH TWICE A DAY --MAX LIMIT 60 capsule 5  . propranolol (INDERAL) 20 MG tablet Take 1 tablet (20 mg total) by mouth 2 (two) times daily. 180 tablet 3  . Red Yeast Rice Extract (RED YEAST RICE PO) Take by mouth daily.     No facility-administered medications prior to visit.     Allergies  Allergen Reactions  . Amoxicillin Rash  . Ampicillin Rash  . Penicillins Rash  . Statins Other (See Comments)    Per reports muscle aches and joint pain    Review of Systems  Constitutional: Positive for malaise/fatigue. Negative for fever.  HENT: Negative for congestion.   Eyes: Negative for blurred vision.  Respiratory: Negative for shortness of breath.   Cardiovascular: Negative for chest pain, palpitations and leg swelling.  Gastrointestinal: Negative for abdominal pain, blood in stool and nausea.  Genitourinary: Negative for dysuria and frequency.  Musculoskeletal: Positive for back pain and joint pain. Negative for falls.  Skin: Negative for rash.  Neurological: Negative for dizziness, loss of consciousness and headaches.  Endo/Heme/Allergies: Negative for environmental allergies.  Psychiatric/Behavioral: Negative for depression. The patient is  not nervous/anxious.        Objective:    Physical Exam Vitals signs and nursing note reviewed.  Constitutional:      General: She is not in acute distress.    Appearance: She is well-developed.  HENT:     Head: Normocephalic and atraumatic.     Nose: Nose normal.  Eyes:  General:        Right eye: No discharge.        Left eye: No discharge.  Neck:     Musculoskeletal: Normal range of motion and neck supple.  Cardiovascular:     Rate and Rhythm: Normal rate and regular rhythm.     Heart sounds: No murmur.  Pulmonary:     Effort: Pulmonary effort is normal.     Breath sounds: Normal breath sounds.  Abdominal:     General: Bowel sounds are normal.     Palpations: Abdomen is soft.     Tenderness: There is no abdominal tenderness.  Skin:    General: Skin is warm and dry.  Neurological:     Mental Status: She is alert and oriented to person, place, and time.     BP (!) 154/71 (BP Location: Left Arm, Patient Position: Sitting, Cuff Size: Normal)   Pulse 71   Temp (!) 96.2 F (35.7 C) (Oral)   Resp 18   Wt 246 lb 12.8 oz (111.9 kg)   SpO2 95%   BMI 46.63 kg/m  Wt Readings from Last 3 Encounters:  10/08/18 246 lb 12.8 oz (111.9 kg)  09/18/18 246 lb 12.8 oz (111.9 kg)  03/12/18 240 lb 9.6 oz (109.1 kg)    Diabetic Foot Exam - Simple   No data filed     Lab Results  Component Value Date   WBC 7.6 10/08/2018   HGB 14.3 10/08/2018   HCT 42.8 10/08/2018   PLT 314.0 10/08/2018   GLUCOSE 91 10/08/2018   CHOL 228 (H) 10/08/2018   TRIG 128.0 10/08/2018   HDL 61.40 10/08/2018   LDLDIRECT 115.0 10/27/2016   LDLCALC 141 (H) 10/08/2018   ALT 8 10/08/2018   AST 15 10/08/2018   NA 143 10/08/2018   K 4.5 10/08/2018   CL 103 10/08/2018   CREATININE 0.88 10/08/2018   BUN 16 10/08/2018   CO2 29 10/08/2018   TSH 4.55 (H) 10/08/2018   HGBA1C 5.6 10/08/2018    Lab Results  Component Value Date   TSH 4.55 (H) 10/08/2018   Lab Results  Component Value Date    WBC 7.6 10/08/2018   HGB 14.3 10/08/2018   HCT 42.8 10/08/2018   MCV 93.0 10/08/2018   PLT 314.0 10/08/2018   Lab Results  Component Value Date   NA 143 10/08/2018   K 4.5 10/08/2018   CO2 29 10/08/2018   GLUCOSE 91 10/08/2018   BUN 16 10/08/2018   CREATININE 0.88 10/08/2018   BILITOT 0.4 10/08/2018   ALKPHOS 102 10/08/2018   AST 15 10/08/2018   ALT 8 10/08/2018   PROT 6.7 10/08/2018   ALBUMIN 4.0 10/08/2018   CALCIUM 9.5 10/08/2018   GFR 62.01 10/08/2018   Lab Results  Component Value Date   CHOL 228 (H) 10/08/2018   Lab Results  Component Value Date   HDL 61.40 10/08/2018   Lab Results  Component Value Date   LDLCALC 141 (H) 10/08/2018   Lab Results  Component Value Date   TRIG 128.0 10/08/2018   Lab Results  Component Value Date   CHOLHDL 4 10/08/2018   Lab Results  Component Value Date   HGBA1C 5.6 10/08/2018       Assessment & Plan:   Problem List Items Addressed This Visit    Moderate persistent asthma    No recent exacerbation      Essential hypertension, benign    Well controlled, no changes to meds. Encouraged  heart healthy diet such as the DASH diet and exercise as tolerated.       Relevant Orders   CBC (Completed)   Comprehensive metabolic panel (Completed)   TSH (Completed)   Mixed hyperlipidemia    Encouraged heart healthy diet, increase exercise, avoid trans fats, consider a krill oil cap daily      Relevant Orders   Lipid panel (Completed)   Back pain - Primary    Check xrays. Encouraged moist heat and gentle stretching as tolerated. May try NSAIDs and prescription meds as directed and report if symptoms worsen or seek immediate care      Relevant Orders   DG HIPS BILAT W OR W/O PELVIS 2V (Completed)   Hyperglycemia    hgba1c acceptable, minimize simple carbs. Increase exercise as tolerated.       Relevant Orders   Hemoglobin A1c (Completed)   Gout    No recent flares check uric acid levels      Relevant Orders    Uric acid (Completed)      I am having Shawnee Knapp maintain her Red Yeast Rice Extract (RED YEAST RICE PO), aspirin, ICaps Areds 2, fluticasone, montelukast, allopurinol, fluticasone, Prevacid, furosemide, Klor-Con M20, diltiazem, albuterol, Azelastine-Fluticasone, and propranolol.  No orders of the defined types were placed in this encounter.    [name], MD

## 2018-10-08 NOTE — Patient Instructions (Addendum)
Weekly vitals  Oxygen in the 90s  Add Benefiber to beverage once to twice daily  Weight Watchers APP  Hypertension, Adult High blood pressure (hypertension) is when the force of blood pumping through the arteries is too strong. The arteries are the blood vessels that carry blood from the heart throughout the body. Hypertension forces the heart to work harder to pump blood and may cause arteries to become narrow or stiff. Untreated or uncontrolled hypertension can cause a heart attack, heart failure, a stroke, kidney disease, and other problems. A blood pressure reading consists of a higher number over a lower number. Ideally, your blood pressure should be below 120/80. The first ("top") number is called the systolic pressure. It is a measure of the pressure in your arteries as your heart beats. The second ("bottom") number is called the diastolic pressure. It is a measure of the pressure in your arteries as the heart relaxes. What are the causes? The exact cause of this condition is not known. There are some conditions that result in or are related to high blood pressure. What increases the risk? Some risk factors for high blood pressure are under your control. The following factors may make you more likely to develop this condition:  Smoking.  Having type 2 diabetes mellitus, high cholesterol, or both.  Not getting enough exercise or physical activity.  Being overweight.  Having too much fat, sugar, calories, or salt (sodium) in your diet.  Drinking too much alcohol. Some risk factors for high blood pressure may be difficult or impossible to change. Some of these factors include:  Having chronic kidney disease.  Having a family history of high blood pressure.  Age. Risk increases with age.  Race. You may be at higher risk if you are African American.  Gender. Men are at higher risk than women before age 35. After age 59, women are at higher risk than men.  Having obstructive  sleep apnea.  Stress. What are the signs or symptoms? High blood pressure may not cause symptoms. Very high blood pressure (hypertensive crisis) may cause:  Headache.  Anxiety.  Shortness of breath.  Nosebleed.  Nausea and vomiting.  Vision changes.  Severe chest pain.  Seizures. How is this diagnosed? This condition is diagnosed by measuring your blood pressure while you are seated, with your arm resting on a flat surface, your legs uncrossed, and your feet flat on the floor. The cuff of the blood pressure monitor will be placed directly against the skin of your upper arm at the level of your heart. It should be measured at least twice using the same arm. Certain conditions can cause a difference in blood pressure between your right and left arms. Certain factors can cause blood pressure readings to be lower or higher than normal for a short period of time:  When your blood pressure is higher when you are in a health care provider's office than when you are at home, this is called white coat hypertension. Most people with this condition do not need medicines.  When your blood pressure is higher at home than when you are in a health care provider's office, this is called masked hypertension. Most people with this condition may need medicines to control blood pressure. If you have a high blood pressure reading during one visit or you have normal blood pressure with other risk factors, you may be asked to:  Return on a different day to have your blood pressure checked again.  Monitor your  blood pressure at home for 1 week or longer. If you are diagnosed with hypertension, you may have other blood or imaging tests to help your health care provider understand your overall risk for other conditions. How is this treated? This condition is treated by making healthy lifestyle changes, such as eating healthy foods, exercising more, and reducing your alcohol intake. Your health care provider  may prescribe medicine if lifestyle changes are not enough to get your blood pressure under control, and if:  Your systolic blood pressure is above 130.  Your diastolic blood pressure is above 80. Your personal target blood pressure may vary depending on your medical conditions, your age, and other factors. Follow these instructions at home: Eating and drinking   Eat a diet that is high in fiber and potassium, and low in sodium, added sugar, and fat. An example eating plan is called the DASH (Dietary Approaches to Stop Hypertension) diet. To eat this way: ? Eat plenty of fresh fruits and vegetables. Try to fill one half of your plate at each meal with fruits and vegetables. ? Eat whole grains, such as whole-wheat pasta, brown rice, or whole-grain bread. Fill about one fourth of your plate with whole grains. ? Eat or drink low-fat dairy products, such as skim milk or low-fat yogurt. ? Avoid fatty cuts of meat, processed or cured meats, and poultry with skin. Fill about one fourth of your plate with lean proteins, such as fish, chicken without skin, beans, eggs, or tofu. ? Avoid pre-made and processed foods. These tend to be higher in sodium, added sugar, and fat.  Reduce your daily sodium intake. Most people with hypertension should eat less than 1,500 mg of sodium a day.  Do not drink alcohol if: ? Your health care provider tells you not to drink. ? You are pregnant, may be pregnant, or are planning to become pregnant.  If you drink alcohol: ? Limit how much you use to:  0-1 drink a day for women.  0-2 drinks a day for men. ? Be aware of how much alcohol is in your drink. In the [place] one drink equals one 12 oz bottle of beer (355 mL), one 5 oz glass of wine (148 mL), or one 1 oz glass of hard liquor (44 mL). Lifestyle   Work with your health care provider to maintain a healthy body weight or to lose weight. Ask what an ideal weight is for you.  Get at least 30 minutes of exercise  most days of the week. Activities may include walking, swimming, or biking.  Include exercise to strengthen your muscles (resistance exercise), such as Pilates or lifting weights, as part of your weekly exercise routine. Try to do these types of exercises for 30 minutes at least 3 days a week.  Do not use any products that contain nicotine or tobacco, such as cigarettes, e-cigarettes, and chewing tobacco. If you need help quitting, ask your health care provider.  Monitor your blood pressure at home as told by your health care provider.  Keep all follow-up visits as told by your health care provider. This is important. Medicines  Take over-the-counter and prescription medicines only as told by your health care provider. Follow directions carefully. Blood pressure medicines must be taken as prescribed.  Do not skip doses of blood pressure medicine. Doing this puts you at risk for problems and can make the medicine less effective.  Ask your health care provider about side effects or reactions to medicines that you  should watch for. Contact a health care provider if you:  Think you are having a reaction to a medicine you are taking.  Have headaches that keep coming back (recurring).  Feel dizzy.  Have swelling in your ankles.  Have trouble with your vision. Get help right away if you:  Develop a severe headache or confusion.  Have unusual weakness or numbness.  Feel faint.  Have severe pain in your chest or abdomen.  Vomit repeatedly.  Have trouble breathing. Summary  Hypertension is when the force of blood pumping through your arteries is too strong. If this condition is not controlled, it may put you at risk for serious complications.  Your personal target blood pressure may vary depending on your medical conditions, your age, and other factors. For most people, a normal blood pressure is less than 120/80.  Hypertension is treated with lifestyle changes, medicines, or a  combination of both. Lifestyle changes include losing weight, eating a healthy, low-sodium diet, exercising more, and limiting alcohol. This information is not intended to replace advice given to you by your health care provider. Make sure you discuss any questions you have with your health care provider. Document Released: 01/24/2005 Document Revised: 10/04/2017 Document Reviewed: 10/04/2017 Elsevier Patient Education  2020 [employer].

## 2018-10-08 NOTE — Assessment & Plan Note (Addendum)
No recent flares check uric acid levels

## 2018-10-08 NOTE — Assessment & Plan Note (Signed)
hgba1c acceptable, minimize simple carbs. Increase exercise as tolerated.  

## 2018-10-08 NOTE — Assessment & Plan Note (Signed)
No recent exacerbation 

## 2018-10-09 ENCOUNTER — Encounter: Payer: Self-pay | Admitting: Family Medicine

## 2018-10-09 ENCOUNTER — Other Ambulatory Visit (INDEPENDENT_AMBULATORY_CARE_PROVIDER_SITE_OTHER): Payer: Medicare Other

## 2018-10-09 DIAGNOSIS — R7989 Other specified abnormal findings of blood chemistry: Secondary | ICD-10-CM | POA: Diagnosis not present

## 2018-10-09 LAB — T4, FREE: Free T4: 0.98 ng/dL (ref 0.60–1.60)

## 2018-10-11 ENCOUNTER — Other Ambulatory Visit: Payer: Self-pay | Admitting: Family Medicine

## 2018-10-11 ENCOUNTER — Telehealth: Payer: Self-pay | Admitting: Family Medicine

## 2018-10-11 MED ORDER — ALLOPURINOL 100 MG PO TABS: 100.0000 mg | ORAL_TABLET | Freq: Every day | ORAL | 1 refills | Status: DC

## 2018-10-11 NOTE — Telephone Encounter (Signed)
Medication Refill - Medication: allopurinol (ZYLOPRIM) 100 MG tablet   Preferred Pharmacy (with phone number or street name):  [id number] - [place], [place] - [address] [phone] (Phone) [phone] (Fax)

## 2018-10-11 NOTE — Telephone Encounter (Signed)
Requested medication (s) are due for refill today: yes  Requested medication (s) are on the active medication list:yes   Last refill:  12/20/2017  Future visit scheduled:  yes Notes to clinic:  Review for refill   Requested Prescriptions  Pending Prescriptions Disp Refills   allopurinol (ZYLOPRIM) 100 MG tablet  1    Sig: Take 1 tablet (100 mg total) by mouth daily.     Endocrinology:  Gout Agents Failed - 10/11/2018 10:23 AM      Failed - Uric Acid in normal range and within 360 days    Uric Acid, Serum  Date Value Ref Range Status  10/08/2018 7.7 (H) 2.4 - 7.0 mg/dL Final         Passed - Cr in normal range and within 360 days    Creatinine, Ser  Date Value Ref Range Status  10/08/2018 0.88 0.40 - 1.20 mg/dL Final         Passed - Valid encounter within last 12 months    Recent Outpatient Visits          3 days ago Chronic bilateral low back pain, unspecified whether sciatica present   [facility] [place], MD   3 months ago Hyperglycemia   [facility] [place], MD   7 months ago Morbid obesity [facility])   [facility] [place], MD   10 months ago Right flank pain   [facility] [place], MD   1 year ago Hyperglycemia   [facility] [place], MD      Future Appointments            In 6 days Bobbitt, [place] Muta, MD [facility] [place]   In 3 months [name], MD [facility] [place]

## 2018-10-11 NOTE — Telephone Encounter (Signed)
Pt called in to update provider. Pt says that she is suppose to take allopurinol (ZYLOPRIM) 100 MG tablet . pt says that her Rx is saying 1 a day but she is suppose to take 2 daily so she doesn't have enough medication.   Please assist.

## 2018-10-12 ENCOUNTER — Other Ambulatory Visit: Payer: Self-pay | Admitting: *Deleted

## 2018-10-12 MED ORDER — ALLOPURINOL 100 MG PO TABS: 200.0000 mg | ORAL_TABLET | Freq: Every day | ORAL | 1 refills | Status: DC

## 2018-10-16 NOTE — Telephone Encounter (Signed)
Medications sent in on 10/12/18

## 2018-10-17 ENCOUNTER — Ambulatory Visit: Payer: Medicare Other | Admitting: Allergy and Immunology

## 2018-10-18 MED ORDER — LEVOTHYROXINE SODIUM 112 MCG PO TABS: 112.0000 ug | ORAL_TABLET | Freq: Every day | ORAL | 1 refills | Status: DC

## 2018-10-18 NOTE — Telephone Encounter (Signed)
Please send in her new allopurinol rx for 100 mg tabs, 2 tabs po daily and her new strength of Levothyroxine 112 mcg daily and let her know

## 2018-11-22 ENCOUNTER — Ambulatory Visit (INDEPENDENT_AMBULATORY_CARE_PROVIDER_SITE_OTHER): Payer: Medicare Other | Admitting: Allergy and Immunology

## 2018-11-22 ENCOUNTER — Other Ambulatory Visit: Payer: Self-pay

## 2018-11-22 ENCOUNTER — Encounter: Payer: Self-pay | Admitting: Allergy and Immunology

## 2018-11-22 VITALS — BP 126/64 | HR 68 | Temp 97.7°F | Resp 20 | Ht 60.24 in | Wt 250.2 lb

## 2018-11-22 DIAGNOSIS — R0609 Other forms of dyspnea: Secondary | ICD-10-CM

## 2018-11-22 DIAGNOSIS — J4540 Moderate persistent asthma, uncomplicated: Secondary | ICD-10-CM

## 2018-11-22 DIAGNOSIS — K219 Gastro-esophageal reflux disease without esophagitis: Secondary | ICD-10-CM | POA: Diagnosis not present

## 2018-11-22 DIAGNOSIS — J310 Chronic rhinitis: Secondary | ICD-10-CM | POA: Diagnosis not present

## 2018-11-22 MED ORDER — AZELASTINE-FLUTICASONE 137-50 MCG/ACT NA SUSP: NASAL | 5 refills | Status: DC

## 2018-11-22 NOTE — Assessment & Plan Note (Addendum)
Spirometry today revealed FEV1 was 94% predicted without significant postbronchodilator improvement.  The patient has been informed that if this problem persists or progresses further evaluation, including cardiac evaluation, may be warranted.  She and her husband have verbalized understanding.

## 2018-11-22 NOTE — Assessment & Plan Note (Signed)
   Continue Flovent (fluticasone) 110 g, 2 inhalations twice a day, montelukast 10 mg daily bedtime and albuterol HFA, 1-2 inhalations every 4-6 hours as needed and 10 to 15 minutes prior to exertion/exercise.  I have encouraged the use of a spacer device with HFA inhalers.    Subjective and objective measures of pulmonary function will be followed and the treatment plan will be adjusted accordingly.

## 2018-11-22 NOTE — Assessment & Plan Note (Signed)
   Continue appropriate reflux lifestyle modifications and Prevacid as prescribed. 

## 2018-11-22 NOTE — Progress Notes (Signed)
Follow-up Note  RE: Amanda Copeland MRN: [id number] DOB: October 17, 1940 Date of Office Visit: 11/22/2018  Primary care provider: [name], MD Referring provider: [name], MD  History of present illness: Amanda Copeland is a 78 y.o. female with persistent asthma, chronic rhinitis, and history of persistent cough presenting today for follow-up.  She was previously seen in this clinic for her initial evaluation in November 2019.  She reports that she rarely requires albuterol rescue, typically with physical exertion.  However, she notes that she experiences slight dyspnea even while at rest. She does not complain of chest pain or diaphoresis. She reports that her acid reflux is well controlled while taking Prevacid. Her rhinitis symptoms are well controlled with the exception of occasional post nasal drainage. She rare experiences coughing, when she does it seems to originate from the base of the throat.  Assessment and plan: Moderate persistent asthma  Continue Flovent (fluticasone) 110 g, 2 inhalations twice a day, montelukast 10 mg daily bedtime and albuterol HFA, 1-2 inhalations every 4-6 hours as needed and 10 to 15 minutes prior to exertion/exercise.  I have encouraged the use of a spacer device with HFA inhalers.    Subjective and objective measures of pulmonary function will be followed and the treatment plan will be adjusted accordingly.  Chronic nonallergic rhinitis  Continue azelastine/fluticasone nasal spray, 1 spray per nostril twice daily as needed.  Nasal saline spray (i.e., Simply Saline) or nasal saline lavage (i.e., NeilMed) is recommended as needed and prior to medicated nasal sprays.  For thick post nasal drainage, add guaifenesin [phone] mg (Mucinex)  twice daily as needed with adequate hydration as discussed.  Other forms of dyspnea Spirometry today revealed FEV1 was 94% predicted without significant postbronchodilator improvement.  The patient has been  informed that if this problem persists or progresses further evaluation, including cardiac evaluation, may be warranted.  She and her husband have verbalized understanding.  Esophageal reflux  Continue appropriate reflux lifestyle modifications and Prevacid as prescribed.   Meds ordered this encounter  Medications  . Azelastine-Fluticasone (DYMISTA) 137-50 MCG/ACT SUSP    Sig: 1-2 sprays per nostril twice daily for runny nose    Dispense:  23 g    Refill:  5    dispense as written  dymista do not fill generic. The insurance will pay for dymista    Diagnostics: Spirometry reveals an FVC of 2.01 and an FEV1 1.53 L (94% predicted) without significant postbronchodilator improvement.  Please see scanned spirometry results for details.    Physical examination: Blood pressure 126/64, pulse 68, temperature 97.7 F (36.5 C), temperature source Oral, resp. rate 20, height 5' 0.24" (1.53 m), weight 250 lb 3.2 oz (113.5 kg), SpO2 96 %.  General: Alert, interactive, in no acute distress. HEENT: TMs pearly gray, turbinates mildly edematous without discharge, post-pharynx mildly erythematous. Neck: Supple without lymphadenopathy. Lungs: Clear to auscultation without wheezing, rhonchi or rales. CV: Normal S1, S2 without murmurs. Skin: Warm and dry, without lesions or rashes.  The following portions of the patient's history were reviewed and updated as appropriate: allergies, current medications, past family history, past medical history, past social history, past surgical history and problem list.  Current Outpatient Medications  Medication Sig Dispense Refill  . albuterol (PROVENTIL HFA;VENTOLIN HFA) 108 (90 Base) MCG/ACT inhaler INHALE 2 PUFFS INTO THE LUNGS EVERY 4 (FOUR) HOURS AS NEEDED FOR WHEEZING OR SHORTNESS OF BREATH. 6.7 Inhaler 1  . allopurinol (ZYLOPRIM) 100 MG tablet Take 2 tablets (200  mg total) by mouth daily. 180 tablet 1  . Ascorbic Acid (VITAMIN C PO) Take by mouth.    Marland Kitchen  aspirin 81 MG tablet Take 81 mg by mouth daily.    . Azelastine-Fluticasone 137-50 MCG/ACT SUSP 1 SPRAY PER NOSTRIL TWICE DAILY FOR RUNNY NOSE 23 g 0  . diltiazem (TIAZAC) 180 MG 24 hr capsule Take 1 capsule (180 mg total) by mouth daily. 90 capsule 2  . fluticasone (FLOVENT HFA) 110 MCG/ACT inhaler 2 puffs twice daily to prevent coughing or wheezing. 1 Inhaler 12  . furosemide (LASIX) 40 MG tablet TAKE 1 TABLET (40 MG TOTAL) DAILY BY MOUTH. 90 tablet 3  . KLOR-CON M20 20 MEQ tablet TAKE 20 MEQ ( 1 TABLET) DAILY BY MOUTH. 90 tablet 3  . levothyroxine (SYNTHROID) 112 MCG tablet Take 1 tablet (112 mcg total) by mouth daily before breakfast. 90 tablet 1  . montelukast (SINGULAIR) 10 MG tablet TAKE 1 TABLET BY MOUTH EVERY DAY NIGHTLY 30 tablet 11  . Multiple Vitamins-Minerals (ICAPS AREDS 2) CAPS Take 1 capsule by mouth 2 (two) times daily.    . Multiple Vitamins-Minerals (ZINC PO) Take by mouth.    Marland Kitchen PREVACID 30 MG capsule TAKE 1 CAPSULE BY MOUTH TWICE A DAY --MAX LIMIT 60 capsule 5  . propranolol (INDERAL) 20 MG tablet Take 1 tablet (20 mg total) by mouth 2 (two) times daily. 180 tablet 3  . Red Yeast Rice Extract (RED YEAST RICE PO) Take by mouth daily.    . Azelastine-Fluticasone (DYMISTA) 137-50 MCG/ACT SUSP 1-2 sprays per nostril twice daily for runny nose 23 g 5  . fluticasone (FLONASE) 50 MCG/ACT nasal spray USE 2 SPRAYS IN EACH NOSTRIL AT BEDTIME  11   No current facility-administered medications for this visit.     Allergies  Allergen Reactions  . Amoxicillin Rash  . Ampicillin Rash  . Penicillins Rash  . Statins Other (See Comments)    Per reports muscle aches and joint pain Per reports muscle aches and joint pain    Review of systems: Review of systems negative except as noted in HPI / PMHx or noted below: Constitutional: Negative.  HENT: Negative.   Eyes: Negative.  Respiratory: Negative.   Cardiovascular: Negative.  Gastrointestinal: Negative.  Genitourinary: Negative.   Musculoskeletal: Negative.  Neurological: Negative.  Endo/Heme/Allergies: Negative.  Cutaneous: Negative.   Past Medical History:  Diagnosis Date  . Arthritis   . Asthma    childhood now returning  . Back pain affecting pregnancy 11/02/2014  . Benign essential tremor 10/28/2014  . Bilateral dry eyes   . Breast cancer ([place])    right  . Cancer (HCC)    Breast  . Dry eyes   . Encounter for Medicare annual wellness exam 05/22/2016  . Essential hypertension, benign 10/28/2014  . H/O measles   . H/O mumps   . History of chicken pox   . Lumbago 11/02/2014  . Mixed hyperlipidemia 10/28/2014  . Obesity 10/28/2014    Family History  Problem Relation Age of Onset  . Heart disease Mother   . Heart attack Mother   . Heart failure Mother   . Hypertension Mother   . Cancer Father        colon  . Asthma Father   . Parkinson's disease Father   . Cancer Maternal Grandmother        uterine  . Heart disease Maternal Grandfather   . Obesity Daughter   . Appendicitis Paternal Grandmother   . Stroke  Neg Hx     Social History   Socioeconomic History  . Marital status: Married    Spouse name: Not on file  . Number of children: 1  . Years of education: Not on file  . Highest education level: Master's degree (e.g., MA, MS, MEng, MEd, MSW, MBA)  Occupational History  . 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 Financial resource strain: Not on file  . Food insecurity    Worry: Not on file    Inability: Not on file  . Transportation needs    Medical: Not on file    Non-medical: Not on file  Tobacco Use  . Smoking status: Never Smoker  . Smokeless tobacco: Never Used  Substance and Sexual Activity  . Alcohol use: Yes    Alcohol/week: 0.0 standard drinks    Comment: two times a month  . Drug use: No  . Sexual activity: Not on file    Comment: lives with husband, moved [place], no dietary restrictions, retired Education officer, museum  Lifestyle  . Physical  activity    Days per week: Not on file    Minutes per session: Not on file  . Stress: Not on file  Relationships  . Social Herbalist on phone: Not on file    Gets together: Not on file    Attends religious service: Not on file    Active member of club or organization: Not on file    Attends meetings of clubs or organizations: Not on file    Relationship status: Not on file  . Intimate partner violence    Fear of current or ex partner: Not on file    Emotionally abused: Not on file    Physically abused: Not on file    Forced sexual activity: Not on file  Other Topics Concern  . Not on file  Social History Narrative   Pt lives with Spouse at home 1 daughter   Drinks coffee, and soda   Right handed    I appreciate the opportunity to take part in Tovah's care. Please do not hesitate to contact me with questions.  Sincerely,   R. Edgar Frisk, MD

## 2018-11-22 NOTE — Patient Instructions (Addendum)
Moderate persistent asthma  Continue Flovent (fluticasone) 110 g, 2 inhalations twice a day, montelukast 10 mg daily bedtime and albuterol HFA, 1-2 inhalations every 4-6 hours as needed and 10 to 15 minutes prior to exertion/exercise.  I have encouraged the use of a spacer device with HFA inhalers.    Subjective and objective measures of pulmonary function will be followed and the treatment plan will be adjusted accordingly.  Chronic nonallergic rhinitis  Continue azelastine/fluticasone nasal spray, 1 spray per nostril twice daily as needed.  Nasal saline spray (i.e., Simply Saline) or nasal saline lavage (i.e., NeilMed) is recommended as needed and prior to medicated nasal sprays.  For thick post nasal drainage, add guaifenesin [phone] mg (Mucinex)  twice daily as needed with adequate hydration as discussed.  Other forms of dyspnea Spirometry today revealed FEV1 was 94% predicted without significant postbronchodilator improvement.  The patient has been informed that if this problem persists or progresses further evaluation, including cardiac evaluation, may be warranted.  She and her husband have verbalized understanding.  Esophageal reflux  Continue appropriate reflux lifestyle modifications and Prevacid as prescribed.   Return in about 5 months (around 04/22/2019), or if symptoms worsen or fail to improve.

## 2018-11-22 NOTE — Assessment & Plan Note (Signed)
   Continue azelastine/fluticasone nasal spray, 1 spray per nostril twice daily as needed.  Nasal saline spray (i.e., Simply Saline) or nasal saline lavage (i.e., NeilMed) is recommended as needed and prior to medicated nasal sprays.  For thick post nasal drainage, add guaifenesin [phone] mg (Mucinex)  twice daily as needed with adequate hydration as discussed.

## 2018-12-02 ENCOUNTER — Other Ambulatory Visit: Payer: Self-pay | Admitting: Allergy and Immunology

## 2018-12-02 DIAGNOSIS — J45991 Cough variant asthma: Secondary | ICD-10-CM

## 2018-12-11 ENCOUNTER — Telehealth: Payer: Self-pay | Admitting: Family Medicine

## 2018-12-11 NOTE — Telephone Encounter (Signed)
Patient declined AWV at this time. SF °

## 2018-12-23 ENCOUNTER — Other Ambulatory Visit: Payer: Self-pay | Admitting: Allergy and Immunology

## 2019-01-10 ENCOUNTER — Ambulatory Visit: Payer: Medicare Other | Admitting: Family Medicine

## 2019-01-21 NOTE — Progress Notes (Signed)
Cardiology Office Note   Date:  01/22/2019   ID:  Amanda Copeland, DOB Feb 28, 1940, MRN [id number]  PCP:  [name], MD    No chief complaint on file.  RF for CAD  Wt Readings from Last 3 Encounters:  01/22/19 251 lb (113.9 kg)  11/22/18 250 lb 3.2 oz (113.5 kg)  10/08/18 246 lb 12.8 oz (111.9 kg)       History of Present Illness: Amanda Copeland is a 78 y.o. female  Who has RF for CAD. SHe has had a stress test several years ago that was normal.  She is intolerant to statins to muscle pain and fatigue. SHe took atorvastatin. She also tried another, maybe pravastatin but did not tolerate this.   It was recommended that she follow the DASH diet for weight loss.  In 2018, She saw pulmonary. She was told her lungs are ok.  Lived in [place], then moved to the [place] area.   She never tried the DASH diet.    She does minimize salt.    Past Medical History:  Diagnosis Date  . Arthritis   . Asthma    childhood now returning  . Back pain affecting pregnancy 11/02/2014  . Benign essential tremor 10/28/2014  . Bilateral dry eyes   . Breast cancer ([place])    right  . Cancer (HCC)    Breast  . Dry eyes   . Encounter for Medicare annual wellness exam 05/22/2016  . Essential hypertension, benign 10/28/2014  . H/O measles   . H/O mumps   . History of chicken pox   . Lumbago 11/02/2014  . Mixed hyperlipidemia 10/28/2014  . Obesity 10/28/2014    Past Surgical History:  Procedure Laterality Date  . BACK SURGERY    . BREAST REDUCTION SURGERY Left   . CHOLECYSTECTOMY    . EYE SURGERY  2008   b/l cataracts removed, in [place]  . MASTECTOMY Right   . REDUCTION MAMMAPLASTY    . REPLACEMENT TOTAL KNEE BILATERAL Bilateral   . ROTATOR CUFF REPAIR Left      Current Outpatient Medications  Medication Sig Dispense Refill  . albuterol (PROVENTIL HFA;VENTOLIN HFA) 108 (90 Base) MCG/ACT inhaler INHALE 2 PUFFS INTO THE LUNGS EVERY 4 (FOUR) HOURS AS NEEDED FOR  WHEEZING OR SHORTNESS OF BREATH. 6.7 Inhaler 1  . allopurinol (ZYLOPRIM) 100 MG tablet Take 2 tablets (200 mg total) by mouth daily. 180 tablet 1  . Ascorbic Acid (VITAMIN C PO) Take by mouth.    Marland Kitchen aspirin 81 MG tablet Take 81 mg by mouth daily.    . Azelastine-Fluticasone (DYMISTA) 137-50 MCG/ACT SUSP 1-2 sprays per nostril twice daily for runny nose 23 g 5  . Azelastine-Fluticasone 137-50 MCG/ACT SUSP 1 SPRAY PER NOSTRIL TWICE DAILY FOR RUNNY NOSE 23 g 0  . diltiazem (TIAZAC) 180 MG 24 hr capsule Take 1 capsule (180 mg total) by mouth daily. 90 capsule 2  . fluticasone (FLONASE) 50 MCG/ACT nasal spray USE 2 SPRAYS IN EACH NOSTRIL AT BEDTIME  11  . fluticasone (FLOVENT HFA) 110 MCG/ACT inhaler 2 PUFFS TWICE DAILY TO PREVENT COUGHING OR WHEEZING. 36 g 3  . KLOR-CON M20 20 MEQ tablet TAKE 20 MEQ ( 1 TABLET) DAILY BY MOUTH. 90 tablet 3  . levothyroxine (SYNTHROID) 112 MCG tablet Take 1 tablet (112 mcg total) by mouth daily before breakfast. 90 tablet 1  . montelukast (SINGULAIR) 10 MG tablet TAKE 1 TABLET ONCE AT BEDTIME FOR COUGHING OR WHEEZING. [place]  tablet 5  . Multiple Vitamins-Minerals (ICAPS AREDS 2) CAPS Take 1 capsule by mouth 2 (two) times daily.    . Multiple Vitamins-Minerals (ZINC PO) Take by mouth.    Marland Kitchen PREVACID 30 MG capsule TAKE 1 CAPSULE BY MOUTH TWICE A DAY --MAX LIMIT 60 capsule 5  . propranolol (INDERAL) 20 MG tablet Take 1 tablet (20 mg total) by mouth 2 (two) times daily. 180 tablet 3  . Red Yeast Rice Extract (RED YEAST RICE PO) Take by mouth daily.    . furosemide (LASIX) 40 MG tablet TAKE 1 TABLET (40 MG TOTAL) DAILY BY MOUTH. 90 tablet 3   No current facility-administered medications for this visit.    Allergies:   Amoxicillin, Ampicillin, Penicillins, and Statins    Social History:  The patient  reports that she has never smoked. She has never used smokeless tobacco. She reports current alcohol use. She reports that she does not use drugs.   Family History:  The  patient's family history includes Appendicitis in her paternal grandmother; Asthma in her father; Cancer in her father and maternal grandmother; Heart attack in her mother; Heart disease in her maternal grandfather and mother; Heart failure in her mother; Hypertension in her mother; Obesity in her daughter; Parkinson's disease in her father.    ROS:  Please see the history of present illness.   Otherwise, review of systems are positive for DOE.   All other systems are reviewed and negative.    PHYSICAL EXAM: VS:  BP 137/83   Pulse (!) 58   Ht 5\' 1"  (1.549 m)   Wt 251 lb (113.9 kg)   SpO2 96%   BMI 47.43 kg/m  , BMI Body mass index is 47.43 kg/m. GEN: Well nourished, well developed, in no acute distress  HEENT: normal  Neck: no JVD, carotid bruits, or masses Cardiac: RRR; no murmurs, rubs, or gallops,no edema  Respiratory:  clear to auscultation bilaterally, normal work of breathing GI: soft, nontender, nondistended, + BS MS: no deformity or atrophy  Skin: warm and dry, no rash Neuro:  Strength and sensation are intact Psych: euthymic mood, full affect   EKG:   The ekg ordered today demonstrates sinus bradycardia, no ST segment changes   Recent Labs: 10/08/2018: ALT 8; BUN 16; Creatinine, Ser 0.88; Hemoglobin 14.3; Platelets 314.0; Potassium 4.5; Sodium 143; TSH 4.55   Lipid Panel    Component Value Date/Time   CHOL 228 (H) 10/08/2018 1017   TRIG 128.0 10/08/2018 1017   HDL 61.40 10/08/2018 1017   CHOLHDL 4 10/08/2018 1017   VLDL 25.6 10/08/2018 1017   LDLCALC 141 (H) 10/08/2018 1017   LDLDIRECT 115.0 10/27/2016 1145     Other studies Reviewed: Additional studies/ records that were reviewed today with results demonstrating: prior chest CT showed aortic clalcifications.   ASSESSMENT AND PLAN:  1. Chronic diastolic heart failure: DOE, persisting.  Given RF for CAD, will plan on coronary CTA.  HR 58 on her propranolol.  THere is a delay in getting CT. If sx worsen, wil  have to consdier stress test or cath, depending on severity of sx. 2. Hypertensive heart disease:  The current medical regimen is effective;  continue present plan and medications. 3. Hyperlipidemia: LDL 141.  CTA will help [place] tell urgency of working on tolerating a statin or trying a PCSK9 inhibitor.    Current medicines are reviewed at length with the patient today.  The patient concerns regarding her medicines were addressed.  The following changes  have been made:  No change  Labs/ tests ordered today include:   Orders Placed This Encounter  Procedures  . CT CORONARY MORPH W/CTA COR W/SCORE W/CA W/CM &/OR WO/CM  . CT CORONARY FRACTIONAL FLOW RESERVE DATA PREP  . CT CORONARY FRACTIONAL FLOW RESERVE FLUID ANALYSIS  . Basic metabolic panel  . EKG 12-Lead    Recommend 150 minutes/week of aerobic exercise Low fat, low carb, high fiber diet recommended  Disposition:   FU in 1 year, or sooner if CTA is abnormal   Signed, Larae Grooms, MD  01/22/2019 4:59 PM    [facility] [place], [place], [place]  [address] Phone: [phone]; Fax: [phone]

## 2019-01-22 ENCOUNTER — Other Ambulatory Visit: Payer: Self-pay

## 2019-01-22 ENCOUNTER — Encounter: Payer: Self-pay | Admitting: Interventional Cardiology

## 2019-01-22 ENCOUNTER — Ambulatory Visit (INDEPENDENT_AMBULATORY_CARE_PROVIDER_SITE_OTHER): Payer: Medicare Other | Admitting: Interventional Cardiology

## 2019-01-22 VITALS — BP 137/83 | HR 58 | Ht 61.0 in | Wt 251.0 lb

## 2019-01-22 DIAGNOSIS — I10 Essential (primary) hypertension: Secondary | ICD-10-CM | POA: Diagnosis not present

## 2019-01-22 DIAGNOSIS — I5032 Chronic diastolic (congestive) heart failure: Secondary | ICD-10-CM

## 2019-01-22 DIAGNOSIS — E782 Mixed hyperlipidemia: Secondary | ICD-10-CM

## 2019-01-22 DIAGNOSIS — Z01812 Encounter for preprocedural laboratory examination: Secondary | ICD-10-CM

## 2019-01-22 DIAGNOSIS — I110 Hypertensive heart disease with heart failure: Secondary | ICD-10-CM | POA: Diagnosis not present

## 2019-01-22 NOTE — Patient Instructions (Addendum)
Medication Instructions:  Your physician recommends that you continue on your current medications as directed. Please refer to the Current Medication list given to you today.  *If you need a refill on your cardiac medications before your next appointment, please call your pharmacy*  Lab Work: Your physician recommends that you return for lab work (BMET) prior to Cardiac CT   If you have labs (blood work) drawn today and your tests are completely normal, you will receive your results only by: Marland Kitchen MyChart Message (if you have MyChart) OR . A paper copy in the mail If you have any lab test that is abnormal or we need to change your treatment, we will call you to review the results.  Testing/Procedures: Your physician has requested that you have cardiac CT. Cardiac computed tomography (CT) is a painless test that uses an x-ray machine to take clear, detailed pictures of your heart. For further information please visit HugeFiesta.tn. Please follow instruction sheet as given.  Follow-Up: At [facility], you and your health needs are our priority.  As part of our continuing mission to provide you with exceptional heart care, we have created designated Provider Care Teams.  These Care Teams include your primary Cardiologist (physician) and Advanced Practice Providers (APPs -  Physician Assistants and Nurse Practitioners) who all work together to provide you with the care you need, when you need it.  Your next appointment:   12 month(s)  The format for your next appointment:   In Person  Provider:   You may see Casandra Doffing, MD or one of the following Advanced Practice Providers on your designated Care Team:    Melina Copa, PA-C  Ermalinda Barrios, PA-C   Other Instructions CARDIAC CT INSTRUCTIONS  Your cardiac CT will be scheduled at one of the below locations:   [facility] [address] [place], [place] [address] [phone]  [place] [address] [place], [place] [address] [phone]  If scheduled at [facility], please arrive at the [facility] main entrance of [facility] 30-45 minutes prior to test start time. Proceed to the [facility] Radiology Department (first floor) to check-in and test prep.  If scheduled at [facility], please arrive 15 mins early for check-in and test prep.  Please follow these instructions carefully (unless otherwise directed):   On the Night Before the Test: . Be sure to Drink plenty of water. . Do not consume any caffeinated/decaffeinated beverages or chocolate 12 hours prior to your test. . Do not take any antihistamines 12 hours prior to your test.   On the Day of the Test: . Drink plenty of water. Do not drink any water within one hour of the test. . Do not eat any food 4 hours prior to the test. . You may take your regular medications prior to the test.  . Take your propranolol two hours prior to test. No additional beta blocker needed per Dr. Irish Lack. Marland Kitchen HOLD Furosemide morning of the test. . FEMALES- please wear underwire-free bra if available     After the Test: . Drink plenty of water. . After receiving IV contrast, you may experience a mild flushed feeling. This is normal. . On occasion, you may experience a mild rash up to 24 hours after the test. This is not dangerous. If this occurs, you can take Benadryl 25 mg and increase your fluid intake. . If you experience trouble breathing,  this can be serious. If it is severe call 911 IMMEDIATELY. If it is mild, please call our office.  Once we have confirmed authorization from your insurance company, we will call you to set up a date and time for your test.   For non-scheduling related questions, please contact the cardiac imaging nurse navigator should you have any questions/concerns: Marchia Bond, RN Navigator Cardiac Imaging [facility] [phone] Office

## 2019-01-24 ENCOUNTER — Other Ambulatory Visit: Payer: Self-pay | Admitting: Interventional Cardiology

## 2019-02-02 ENCOUNTER — Other Ambulatory Visit: Payer: Self-pay | Admitting: Family Medicine

## 2019-02-20 ENCOUNTER — Other Ambulatory Visit: Payer: Self-pay | Admitting: Family Medicine

## 2019-02-22 DIAGNOSIS — H353122 Nonexudative age-related macular degeneration, left eye, intermediate dry stage: Secondary | ICD-10-CM | POA: Diagnosis not present

## 2019-02-22 DIAGNOSIS — Z961 Presence of intraocular lens: Secondary | ICD-10-CM | POA: Diagnosis not present

## 2019-02-22 DIAGNOSIS — H35363 Drusen (degenerative) of macula, bilateral: Secondary | ICD-10-CM | POA: Diagnosis not present

## 2019-02-22 DIAGNOSIS — H353112 Nonexudative age-related macular degeneration, right eye, intermediate dry stage: Secondary | ICD-10-CM | POA: Diagnosis not present

## 2019-02-22 DIAGNOSIS — H35453 Secondary pigmentary degeneration, bilateral: Secondary | ICD-10-CM | POA: Diagnosis not present

## 2019-02-22 DIAGNOSIS — H43813 Vitreous degeneration, bilateral: Secondary | ICD-10-CM | POA: Diagnosis not present

## 2019-02-25 ENCOUNTER — Other Ambulatory Visit: Payer: Self-pay

## 2019-02-26 ENCOUNTER — Encounter: Payer: Self-pay | Admitting: Family Medicine

## 2019-02-26 ENCOUNTER — Ambulatory Visit (INDEPENDENT_AMBULATORY_CARE_PROVIDER_SITE_OTHER): Payer: Medicare Other | Admitting: Family Medicine

## 2019-02-26 ENCOUNTER — Other Ambulatory Visit: Payer: Self-pay

## 2019-02-26 VITALS — BP 128/74 | HR 62 | Temp 98.0°F | Resp 18 | Wt 251.6 lb

## 2019-02-26 DIAGNOSIS — M25551 Pain in right hip: Secondary | ICD-10-CM

## 2019-02-26 DIAGNOSIS — M109 Gout, unspecified: Secondary | ICD-10-CM

## 2019-02-26 DIAGNOSIS — M25511 Pain in right shoulder: Secondary | ICD-10-CM

## 2019-02-26 DIAGNOSIS — R739 Hyperglycemia, unspecified: Secondary | ICD-10-CM

## 2019-02-26 DIAGNOSIS — I10 Essential (primary) hypertension: Secondary | ICD-10-CM

## 2019-02-26 DIAGNOSIS — M5440 Lumbago with sciatica, unspecified side: Secondary | ICD-10-CM

## 2019-02-26 DIAGNOSIS — M1990 Unspecified osteoarthritis, unspecified site: Secondary | ICD-10-CM | POA: Diagnosis not present

## 2019-02-26 DIAGNOSIS — E782 Mixed hyperlipidemia: Secondary | ICD-10-CM | POA: Diagnosis not present

## 2019-02-26 LAB — COMPREHENSIVE METABOLIC PANEL
ALT: 10 U/L (ref 0–35)
AST: 16 U/L (ref 0–37)
Albumin: 4 g/dL (ref 3.5–5.2)
Alkaline Phosphatase: 111 U/L (ref 39–117)
BUN: 12 mg/dL (ref 6–23)
CO2: 27 mEq/L (ref 19–32)
Calcium: 9.5 mg/dL (ref 8.4–10.5)
Chloride: 103 mEq/L (ref 96–112)
Creatinine, Ser: 0.82 mg/dL (ref 0.40–1.20)
GFR: 67.21 mL/min (ref >60.00)
Glucose, Bld: 84 mg/dL (ref 70–99)
Potassium: 4.4 mEq/L (ref 3.5–5.1)
Sodium: 141 mEq/L (ref 135–145)
Total Bilirubin: 0.4 mg/dL (ref 0.2–1.2)
Total Protein: 6.6 g/dL (ref 6.0–8.3)

## 2019-02-26 LAB — LIPID PANEL
Cholesterol: 217 mg/dL — ABNORMAL HIGH (ref 0–200)
HDL: 57.1 mg/dL (ref >39.00)
LDL Cholesterol: 130 mg/dL — ABNORMAL HIGH (ref 0–99)
NonHDL: 160.28
Total CHOL/HDL Ratio: 4
Triglycerides: 152 mg/dL — ABNORMAL HIGH (ref 0.0–149.0)
VLDL: 30.4 mg/dL (ref 0.0–40.0)

## 2019-02-26 LAB — HEMOGLOBIN A1C: Hgb A1c MFr Bld: 5.5 % (ref 4.6–6.5)

## 2019-02-26 LAB — CBC
HCT: 41.2 % (ref 36.0–46.0)
Hemoglobin: 13.4 g/dL (ref 12.0–15.0)
MCHC: 32.7 g/dL (ref 30.0–36.0)
MCV: 95.7 fl (ref 78.0–100.0)
Platelets: 327 10*3/uL (ref 150.0–400.0)
RBC: 4.3 Mil/uL (ref 3.87–5.11)
RDW: 14.9 % (ref 11.5–15.5)
WBC: 7.8 10*3/uL (ref 4.0–10.5)

## 2019-02-26 LAB — URIC ACID: Uric Acid, Serum: 5.6 mg/dL (ref 2.4–7.0)

## 2019-02-26 LAB — TSH: TSH: 1.16 u[IU]/mL (ref 0.35–4.50)

## 2019-02-26 NOTE — Patient Instructions (Addendum)
Multivitamin with minerals especially with selenium  Vitamin D 02-1998 IU daily Probiotic daily Melatonin 2.5-5 mg at bedtime    Shoulder Pain Many things can cause shoulder pain, including:  An injury to the shoulder.  Overuse of the shoulder.  Arthritis. The source of the pain can be:  Inflammation.  An injury to the shoulder joint.  An injury to a tendon, ligament, or bone. Follow these instructions at home: Pay attention to changes in your symptoms. Let your health care provider know about them. Follow these instructions to relieve your pain. If you have a sling:  Wear the sling as told by your health care provider. Remove it only as told by your health care provider.  Loosen the sling if your fingers tingle, become numb, or turn cold and blue.  Keep the sling clean.  If the sling is not waterproof: ? Do not let it get wet. Remove it to shower or bathe.  Move your arm as little as possible, but keep your hand moving to prevent swelling. Managing pain, stiffness, and swelling   If directed, put ice on the painful area: ? Put ice in a plastic bag. ? Place a towel between your skin and the bag. ? Leave the ice on for 20 minutes, 2-3 times per day. Stop applying ice if it does not help with the pain.  Squeeze a soft ball or a foam pad as much as possible. This helps to keep the shoulder from swelling. It also helps to strengthen the arm. General instructions  Take over-the-counter and prescription medicines only as told by your health care provider.  Keep all follow-up visits as told by your health care provider. This is important. Contact a health care provider if:  Your pain gets worse.  Your pain is not relieved with medicines.  New pain develops in your arm, hand, or fingers. Get help right away if:  Your arm, hand, or fingers: ? Tingle. ? Become numb. ? Become swollen. ? Become painful. ? Turn white or blue. Summary  Shoulder pain can be caused  by an injury, overuse, or arthritis.  Pay attention to changes in your symptoms. Let your health care provider know about them.  This condition may be treated with a sling, ice, and pain medicines.  Contact your health care provider if the pain gets worse or new pain develops. Get help right away if your arm, hand, or fingers tingle or become numb, swollen, or painful.  Keep all follow-up visits as told by your health care provider. This is important. This information is not intended to replace advice given to you by your health care provider. Make sure you discuss any questions you have with your health care provider. Document Revised: 08/08/2017 Document Reviewed: 08/08/2017 Elsevier Patient Education  [place].

## 2019-02-26 NOTE — Progress Notes (Signed)
Subjective:    Patient ID: Amanda Copeland, female    DOB: [birth year], 79 y.o.   MRN: [id number]  No chief complaint on file.   HPI Patient is in today for follow up on chronic medical concerns. She is having increasing pain and dysfunction in her right shoulder. She notes decreased ROM due to pain. No falls, trauma or notably radiculopathy. She is ready to seek orthopaedic consultation due to severity of discomfort. No other acute concern. No recent febrile illness or hospitalizations. Denies CP/palp/SOB/HA/congestion/fevers/GI or GU c/o. Taking meds as prescribed  Past Medical History:  Diagnosis Date  . Arthritis   . Asthma    childhood now returning  . Back pain affecting pregnancy 11/02/2014  . Benign essential tremor 10/28/2014  . Bilateral dry eyes   . Breast cancer ([address])    right  . Cancer (HCC)    Breast  . Dry eyes   . Encounter for Medicare annual wellness exam 05/22/2016  . Essential hypertension, benign 10/28/2014  . H/O measles   . H/O mumps   . History of chicken pox   . Lumbago 11/02/2014  . Mixed hyperlipidemia 10/28/2014  . Obesity 10/28/2014    Past Surgical History:  Procedure Laterality Date  . BACK SURGERY    . BREAST REDUCTION SURGERY Left   . CHOLECYSTECTOMY    . EYE SURGERY  2008   b/l cataracts removed, in [place]  . MASTECTOMY Right   . REDUCTION MAMMAPLASTY    . REPLACEMENT TOTAL KNEE BILATERAL Bilateral   . ROTATOR CUFF REPAIR Left     Family History  Problem Relation Age of Onset  . Heart disease Mother   . Heart attack Mother   . Heart failure Mother   . Hypertension Mother   . Cancer Father        colon  . Asthma Father   . Parkinson's disease Father   . Cancer Maternal Grandmother        uterine  . Heart disease Maternal Grandfather   . Obesity Daughter   . Appendicitis Paternal Grandmother   . Stroke Neg Hx     Social History   Socioeconomic History  . Marital status: Married    Spouse name: Not on file  . Number of  children: 1  . Years of education: Not on file  . Highest education level: Master's degree (e.g., MA, MS, MEng, MEd, MSW, MBA)  Occupational History  . 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 Smoking status: Never Smoker  . Smokeless tobacco: Never Used  Substance and Sexual Activity  . Alcohol use: Yes    Alcohol/week: 0.0 standard drinks    Comment: two times a month  . Drug use: No  . Sexual activity: Not on file    Comment: lives with husband, moved [place], no dietary restrictions, retired Education officer, museum  Other Topics Concern  . Not on file  Social History Narrative   Pt lives with Spouse at home 1 daughter   Drinks coffee, and soda   Right handed   Social Determinants of Health   Financial Resource Strain:   . Difficulty of Paying Living Expenses: Not on file  Food Insecurity:   . Worried About Charity fundraiser in the Last Year: Not on file  . Ran Out of Food in the Last Year: Not on file  Transportation Needs:   . Lack of Transportation (Medical): Not on file  . Lack  of Transportation (Non-Medical): Not on file  Physical Activity:   . Days of Exercise per Week: Not on file  . Minutes of Exercise per Session: Not on file  Stress:   . Feeling of Stress : Not on file  Social Connections:   . Frequency of Communication with Friends and Family: Not on file  . Frequency of Social Gatherings with Friends and Family: Not on file  . Attends Religious Services: Not on file  . Active Member of Clubs or Organizations: Not on file  . Attends Archivist Meetings: Not on file  . Marital Status: Not on file  Intimate Partner Violence:   . Fear of Current or Ex-Partner: Not on file  . Emotionally Abused: Not on file  . Physically Abused: Not on file  . Sexually Abused: Not on file    Outpatient Medications Prior to Visit  Medication Sig Dispense Refill  . albuterol (PROVENTIL HFA;VENTOLIN HFA) 108 (90 Base) MCG/ACT inhaler INHALE 2  PUFFS INTO THE LUNGS EVERY 4 (FOUR) HOURS AS NEEDED FOR WHEEZING OR SHORTNESS OF BREATH. 6.7 Inhaler 1  . allopurinol (ZYLOPRIM) 100 MG tablet TAKE 2 TABLETS BY MOUTH EVERY DAY 180 tablet 0  . Ascorbic Acid (VITAMIN C PO) Take by mouth.    Marland Kitchen aspirin 81 MG tablet Take 81 mg by mouth daily.    . Azelastine-Fluticasone (DYMISTA) 137-50 MCG/ACT SUSP 1-2 sprays per nostril twice daily for runny nose 23 g 5  . Azelastine-Fluticasone 137-50 MCG/ACT SUSP 1 SPRAY PER NOSTRIL TWICE DAILY FOR RUNNY NOSE 23 g 0  . fluticasone (FLONASE) 50 MCG/ACT nasal spray USE 2 SPRAYS IN EACH NOSTRIL AT BEDTIME  11  . fluticasone (FLOVENT HFA) 110 MCG/ACT inhaler 2 PUFFS TWICE DAILY TO PREVENT COUGHING OR WHEEZING. 36 g 3  . furosemide (LASIX) 40 MG tablet TAKE 1 TABLET (40 MG TOTAL) DAILY BY MOUTH. 90 tablet 3  . KLOR-CON M20 20 MEQ tablet TAKE 1 TABLET BY MOUTH EVERY DAY 30 tablet 11  . levothyroxine (SYNTHROID) 112 MCG tablet Take 1 tablet (112 mcg total) by mouth daily before breakfast. 90 tablet 1  . montelukast (SINGULAIR) 10 MG tablet TAKE 1 TABLET ONCE AT BEDTIME FOR COUGHING OR WHEEZING. 30 tablet 5  . Multiple Vitamins-Minerals (ICAPS AREDS 2) CAPS Take 1 capsule by mouth 2 (two) times daily.    . Multiple Vitamins-Minerals (ZINC PO) Take by mouth.    Marland Kitchen PREVACID 30 MG capsule TAKE 1 CAPSULE BY MOUTH TWICE A DAY --MAX LIMIT 60 capsule 5  . propranolol (INDERAL) 20 MG tablet Take 1 tablet (20 mg total) by mouth 2 (two) times daily. 180 tablet 3  . Red Yeast Rice Extract (RED YEAST RICE PO) Take by mouth daily.    [name] ER 180 MG 24 hr capsule TAKE 1 CAPSULE BY MOUTH EVERY DAY 90 capsule 1   No facility-administered medications prior to visit.    Allergies  Allergen Reactions  . Amoxicillin Rash  . Ampicillin Rash  . Penicillins Rash  . Statins Other (See Comments)    Per reports muscle aches and joint pain Per reports muscle aches and joint pain    Review of Systems  Constitutional: Positive for  malaise/fatigue. Negative for fever.  HENT: Positive for congestion.   Eyes: Negative for blurred vision.  Respiratory: Negative for shortness of breath.   Cardiovascular: Negative for chest pain, palpitations and leg swelling.  Gastrointestinal: Negative for abdominal pain, blood in stool and nausea.  Genitourinary: Negative for  dysuria and frequency.  Musculoskeletal: Positive for back pain and joint pain. Negative for falls.  Skin: Negative for rash.  Neurological: Negative for dizziness, loss of consciousness and headaches.  Endo/Heme/Allergies: Negative for environmental allergies.  Psychiatric/Behavioral: Negative for depression. The patient is not nervous/anxious.        Objective:    Physical Exam Vitals and nursing note reviewed.  Constitutional:      General: She is not in acute distress.    Appearance: She is well-developed.  HENT:     Head: Normocephalic and atraumatic.     Nose: Nose normal.  Eyes:     General:        Right eye: No discharge.        Left eye: No discharge.  Cardiovascular:     Rate and Rhythm: Normal rate and regular rhythm.     Heart sounds: No murmur.  Pulmonary:     Effort: Pulmonary effort is normal.     Breath sounds: Normal breath sounds.  Abdominal:     General: Bowel sounds are normal.     Palpations: Abdomen is soft.     Tenderness: There is no abdominal tenderness.  Musculoskeletal:     Cervical back: Normal range of motion and neck supple.  Skin:    General: Skin is warm and dry.  Neurological:     Mental Status: She is alert and oriented to person, place, and time.     BP 128/74 (BP Location: Left Arm, Patient Position: Sitting, Cuff Size: Normal)   Pulse 62   Temp 98 F (36.7 C) (Temporal)   Resp 18   Wt 251 lb 9.6 oz (114.1 kg)   SpO2 97%   BMI 47.54 kg/m  Wt Readings from Last 3 Encounters:  02/28/19 251 lb (113.9 kg)  02/26/19 251 lb 9.6 oz (114.1 kg)  01/22/19 251 lb (113.9 kg)    Diabetic Foot Exam - Simple    No data filed     Lab Results  Component Value Date   WBC 7.8 02/26/2019   HGB 13.4 02/26/2019   HCT 41.2 02/26/2019   PLT 327.0 02/26/2019   GLUCOSE 84 02/26/2019   CHOL 217 (H) 02/26/2019   TRIG 152.0 (H) 02/26/2019   HDL 57.10 02/26/2019   LDLDIRECT 115.0 10/27/2016   LDLCALC 130 (H) 02/26/2019   ALT 10 02/26/2019   AST 16 02/26/2019   NA 141 02/26/2019   K 4.4 02/26/2019   CL 103 02/26/2019   CREATININE 0.82 02/26/2019   BUN 12 02/26/2019   CO2 27 02/26/2019   TSH 1.16 02/26/2019   HGBA1C 5.5 02/26/2019    Lab Results  Component Value Date   TSH 1.16 02/26/2019   Lab Results  Component Value Date   WBC 7.8 02/26/2019   HGB 13.4 02/26/2019   HCT 41.2 02/26/2019   MCV 95.7 02/26/2019   PLT 327.0 02/26/2019   Lab Results  Component Value Date   NA 141 02/26/2019   K 4.4 02/26/2019   CO2 27 02/26/2019   GLUCOSE 84 02/26/2019   BUN 12 02/26/2019   CREATININE 0.82 02/26/2019   BILITOT 0.4 02/26/2019   ALKPHOS 111 02/26/2019   AST 16 02/26/2019   ALT 10 02/26/2019   PROT 6.6 02/26/2019   ALBUMIN 4.0 02/26/2019   CALCIUM 9.5 02/26/2019   GFR 67.21 02/26/2019   Lab Results  Component Value Date   CHOL 217 (H) 02/26/2019   Lab Results  Component Value Date   HDL  57.10 02/26/2019   Lab Results  Component Value Date   LDLCALC 130 (H) 02/26/2019   Lab Results  Component Value Date   TRIG 152.0 (H) 02/26/2019   Lab Results  Component Value Date   CHOLHDL 4 02/26/2019   Lab Results  Component Value Date   HGBA1C 5.5 02/26/2019       Assessment & Plan:   Problem List Items Addressed This Visit    Arthritis - Primary   Relevant Orders   Ambulatory referral to Orthopedic Surgery   Essential hypertension, benign    Well controlled, no changes to meds. Encouraged heart healthy diet such as the DASH diet and exercise as tolerated.       Relevant Orders   CBC (Completed)   Comprehensive metabolic panel (Completed)   TSH (Completed)    Mixed hyperlipidemia   Relevant Orders   Lipid panel (Completed)   Low back pain    Encouraged moist heat and gentle stretching as tolerated. May try NSAIDs and prescription meds as directed and report if symptoms worsen or seek immediate care. Worse with reaching, no falls or trauma.       Relevant Orders   Ambulatory referral to Orthopedic Surgery   Hyperglycemia    hgba1c acceptable, minimize simple carbs. Increase exercise as tolerated.       Relevant Orders   Hemoglobin A1c (Completed)   Gout    Hydrate and monitor, continue Allopurinol      Relevant Orders   Uric acid (Completed)   Right shoulder pain    No recent falls or trauma. Encouraged topical treatments like moist heat and topical rubs. Is referred to orthopaedics for evaluation due to the extent of her pain and dysfunction.       Relevant Orders   Ambulatory referral to Orthopedic Surgery    Other Visit Diagnoses    Right hip pain       Relevant Orders   Ambulatory referral to Orthopedic Surgery      I am having Cattleya Nevill "[name]" maintain her Red Yeast Rice Extract (RED YEAST RICE PO), aspirin, ICaps Areds 2, fluticasone, Prevacid, furosemide, albuterol, Azelastine-Fluticasone, propranolol, levothyroxine, Ascorbic Acid (VITAMIN C PO), Multiple Vitamins-Minerals (ZINC PO), Azelastine-Fluticasone, montelukast, Flovent HFA, Klor-Con M20, allopurinol, and Tiadylt ER.  No orders of the defined types were placed in this encounter.    [name], MD

## 2019-02-26 NOTE — Assessment & Plan Note (Signed)
Hydrate and monitor, continue Allopurinol

## 2019-02-28 ENCOUNTER — Ambulatory Visit (INDEPENDENT_AMBULATORY_CARE_PROVIDER_SITE_OTHER): Payer: Medicare Other

## 2019-02-28 ENCOUNTER — Other Ambulatory Visit: Payer: Self-pay

## 2019-02-28 ENCOUNTER — Encounter: Payer: Self-pay | Admitting: Orthopaedic Surgery

## 2019-02-28 ENCOUNTER — Ambulatory Visit (INDEPENDENT_AMBULATORY_CARE_PROVIDER_SITE_OTHER): Payer: Medicare Other | Admitting: Orthopaedic Surgery

## 2019-02-28 VITALS — Ht 61.0 in | Wt 251.0 lb

## 2019-02-28 DIAGNOSIS — M5440 Lumbago with sciatica, unspecified side: Secondary | ICD-10-CM | POA: Diagnosis not present

## 2019-02-28 DIAGNOSIS — M25511 Pain in right shoulder: Secondary | ICD-10-CM | POA: Diagnosis not present

## 2019-02-28 DIAGNOSIS — M25551 Pain in right hip: Secondary | ICD-10-CM

## 2019-02-28 DIAGNOSIS — G8929 Other chronic pain: Secondary | ICD-10-CM

## 2019-02-28 DIAGNOSIS — M19011 Primary osteoarthritis, right shoulder: Secondary | ICD-10-CM | POA: Insufficient documentation

## 2019-02-28 NOTE — Progress Notes (Signed)
Office Visit Note   Patient: Amanda Copeland           Date of Birth: Aug 31, 1940           MRN: [id number] Visit Date: 02/28/2019              Requested by: [name], MD [address] [place],  [place] [address] PCP: [name], MD   Assessment & Plan: Visit Diagnoses:  1. Chronic right shoulder pain   2. Pain in right hip   3. Primary osteoarthritis, right shoulder   4. Morbid obesity ([place])   5. Bilateral low back pain with sciatica, sciatica laterality unspecified, unspecified chronicity     Plan: Chronic low back pain is associated with significant degenerative changes in the lumbar spine by plain films.  I suspect she may have stenosis with a history of' neurogenic claudication.  We will obtain an MRI of the lumbar spine.  No evidence of arthritis in either hip.  Degenerative arthritis right shoulder that appears to be primary.  She had good strength and no evidence of a rotator cuff tear by exam.  Will inject with cortisone to both the subacromial space and glenohumeral joint and monitor response.  Return after MRI of lumbar spine  Follow-Up Instructions: Return After MRI of lumbar spine.   Orders:  Orders Placed This Encounter  Procedures  . XR Shoulder Right  . XR Lumbar Spine 2-3 Views  . XR Pelvis 1-2 Views   No orders of the defined types were placed in this encounter.     Procedures: No procedures performed   Clinical Data: No additional findings.   Subjective: Chief Complaint  Patient presents with  . Right Shoulder - Pain  . Lower Back - Pain  Patient presents today for right shoulder pain. No known injury. Patient states that it has been hurting for several months. She has pain across the top of her shoulder. If she raises her arm she has pain in her upper right arm as well. She has good range of motion. She uses AsperCream and heat as needed. No weakness, numbness, or tingling.  No prior injury.  Has had rotator cuff tear  repair on left shoulder She also has right buttock and back pain with walking. She said that it has hurt for a long time. She had surgery many years ago with another physician in [place]. Her pain is worse with standing, walking, and reaching. If she stops to rest it gets better. No numbness, tingling, or weakness in her lower extremities. No groin pain.  Pain seems to be associated with the longer she stands of the further she walks and predominately on the right side.  Has had prior bilateral knee replacements performed in [facility] without any problems  HPI  Review of Systems   Objective: Vital Signs: Ht 5\' 1"  (1.549 m)   Wt 251 lb (113.9 kg)   BMI 47.43 kg/m   Physical Exam Constitutional:      Appearance: She is well-developed.  Eyes:     Pupils: Pupils are equal, round, and reactive to light.  Pulmonary:     Effort: Pulmonary effort is normal.  Skin:    General: Skin is warm and dry.  Neurological:     Mental Status: She is alert and oriented to person, place, and time.  Psychiatric:        Behavior: Behavior normal.     Ortho Exam awake alert and oriented  x3.  Comfortable sitting.  BMI is 47.  To place right arm overhead with mild circuitous motion.  Some pain along the anterior aspect of her shoulder in the area of the glenohumeral joint.  Skin intact.  I believe the biceps is intact.  She can abduct to 90 degrees with good strength.  No weakness.  No crepitation.  Negative impingement and empty can testing.  Painless range of motion both hips with internal and external rotation flexion extension.  Straight leg raise negative.  Very minimal percussible tenderness of lumbar spine  Specialty Comments:  No specialty comments available.  Imaging: XR Lumbar Spine 2-3 Views  Result Date: 02/28/2019 Films of the lumbar spine were obtained in 2 projections and is compared to the films that were performed in 2020.  There is an anterior listhesis of L4 on 5 with facet sclerosis  at the same level as well as L5-S1.  There is narrowing of the L5-S1 disc space.  No scoliosis.  Appears to have an old compression fracture of T11  XR Pelvis 1-2 Views  Result Date: 02/28/2019 AP pelvis did not demonstrate any evidence of fracture on either side of the pelvis.  I did not see any significant degenerative change in either hip.  XR Shoulder Right  Result Date: 02/28/2019 Films of the right shoulder obtained in several projections.  There is evidence of osteoarthritis of the glenohumeral joint with some sclerosis in the humeral head and a large inferior humeral head spur.  Normal space between the humeral head and the acromium.  Some degenerative changes at the [facility] joint.  No ectopic calcification or acute changes    PMFS History: Patient Active Problem List   Diagnosis Date Noted  . Primary osteoarthritis, right shoulder 02/28/2019  . Other forms of dyspnea 11/22/2018  . Educated about COVID-19 virus infection 07/03/2018  . Pain of right hip joint 03/18/2018  . Chronic nonallergic rhinitis 12/20/2017  . Cough, persistent 12/20/2017  . Right flank pain 12/10/2017  . Fatigue 07/12/2017  . Morbid obesity ([place]) 03/05/2017  . Chronic diastolic heart failure ([place]) 11/29/2016  . Dyspnea on exertion 10/06/2016  . Encounter for Medicare annual wellness exam 05/22/2016  . Hyperglycemia 08/07/2015  . Allergic 08/07/2015  . Gout 08/07/2015  . Leukocytosis 08/07/2015  . Lower abdominal pain 04/21/2015  . Low back pain 11/02/2014  . Esophageal reflux 10/28/2014  . Essential hypertension, benign 10/28/2014  . Mixed hyperlipidemia 10/28/2014  . Benign essential tremor 10/28/2014  . Bilateral dry eyes   . Moderate persistent asthma   . Arthritis   . Cancer ([place])   . History of chicken pox    Past Medical History:  Diagnosis Date  . Arthritis   . Asthma    childhood now returning  . Back pain affecting pregnancy 11/02/2014  . Benign essential tremor 10/28/2014  . Bilateral  dry eyes   . Breast cancer ([place])    right  . Cancer (HCC)    Breast  . Dry eyes   . Encounter for Medicare annual wellness exam 05/22/2016  . Essential hypertension, benign 10/28/2014  . H/O measles   . H/O mumps   . History of chicken pox   . Lumbago 11/02/2014  . Mixed hyperlipidemia 10/28/2014  . Obesity 10/28/2014    Family History  Problem Relation Age of Onset  . Heart disease Mother   . Heart attack Mother   . Heart failure Mother   . Hypertension Mother   . Cancer Father  colon  . Asthma Father   . Parkinson's disease Father   . Cancer Maternal Grandmother        uterine  . Heart disease Maternal Grandfather   . Obesity Daughter   . Appendicitis Paternal Grandmother   . Stroke Neg Hx     Past Surgical History:  Procedure Laterality Date  . BACK SURGERY    . BREAST REDUCTION SURGERY Left   . CHOLECYSTECTOMY    . EYE SURGERY  2008   b/l cataracts removed, in [place]  . MASTECTOMY Right   . REDUCTION MAMMAPLASTY    . REPLACEMENT TOTAL KNEE BILATERAL Bilateral   . ROTATOR CUFF REPAIR Left    Social History   Occupational History  . 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 Smoking status: Never Smoker  . Smokeless tobacco: Never Used  Substance and Sexual Activity  . Alcohol use: Yes    Alcohol/week: 0.0 standard drinks    Comment: two times a month  . Drug use: No  . Sexual activity: Not on file    Comment: lives with husband, moved [place], no dietary restrictions, retired Education officer, [employer]

## 2019-02-28 NOTE — Addendum Note (Signed)
Addended by: Lendon Collar on: 02/28/2019 03:37 PM   Modules accepted: Orders

## 2019-03-03 DIAGNOSIS — M25511 Pain in right shoulder: Secondary | ICD-10-CM | POA: Insufficient documentation

## 2019-03-03 NOTE — Assessment & Plan Note (Signed)
No recent falls or trauma. Encouraged topical treatments like moist heat and topical rubs. Is referred to orthopaedics for evaluation due to the extent of her pain and dysfunction.

## 2019-03-03 NOTE — Assessment & Plan Note (Signed)
Encouraged moist heat and gentle stretching as tolerated. May try NSAIDs and prescription meds as directed and report if symptoms worsen or seek immediate care. Worse with reaching, no falls or trauma.

## 2019-03-03 NOTE — Assessment & Plan Note (Signed)
Well controlled, no changes to meds. Encouraged heart healthy diet such as the DASH diet and exercise as tolerated.  °

## 2019-03-03 NOTE — Assessment & Plan Note (Signed)
hgba1c acceptable, minimize simple carbs. Increase exercise as tolerated.  

## 2019-03-04 ENCOUNTER — Telehealth: Payer: Self-pay | Admitting: Interventional Cardiology

## 2019-03-04 DIAGNOSIS — E782 Mixed hyperlipidemia: Secondary | ICD-10-CM | POA: Diagnosis not present

## 2019-03-04 DIAGNOSIS — I10 Essential (primary) hypertension: Secondary | ICD-10-CM | POA: Diagnosis not present

## 2019-03-04 DIAGNOSIS — Z01812 Encounter for preprocedural laboratory examination: Secondary | ICD-10-CM | POA: Diagnosis not present

## 2019-03-04 DIAGNOSIS — I110 Hypertensive heart disease with heart failure: Secondary | ICD-10-CM | POA: Diagnosis not present

## 2019-03-04 DIAGNOSIS — I5032 Chronic diastolic (congestive) heart failure: Secondary | ICD-10-CM | POA: Diagnosis not present

## 2019-03-04 NOTE — Telephone Encounter (Signed)
Merrisa from [facility] [employer] [facility] is calling stating the patient came in for labs but they did not have anything in the system from our office for labs to be done. She states she went ahead and drew the patients blood but is requesting the information for labs to be performed be faxed over to the #: [phone].

## 2019-03-04 NOTE — Addendum Note (Signed)
Addended by: Drue Novel I on: 03/04/2019 09:58 AM   Modules accepted: Orders

## 2019-03-04 NOTE — Telephone Encounter (Signed)
Lab order for BMET faxed to [phone].

## 2019-03-05 LAB — BASIC METABOLIC PANEL
BUN/Creatinine Ratio: 24 (ref 12–28)
BUN: 21 mg/dL (ref 8–27)
CO2: 21 mmol/L (ref 20–29)
Calcium: 9.3 mg/dL (ref 8.7–10.3)
Chloride: 102 mmol/L (ref 96–106)
Creatinine, Ser: 0.88 mg/dL (ref 0.57–1.00)
GFR calc Af Amer: 72 mL/min/{1.73_m2} (ref >59)
GFR calc non Af Amer: 63 mL/min/{1.73_m2} (ref >59)
Glucose: 108 mg/dL — ABNORMAL HIGH (ref 65–99)
Potassium: 4.6 mmol/L (ref 3.5–5.2)
Sodium: 144 mmol/L (ref 134–144)

## 2019-03-07 ENCOUNTER — Ambulatory Visit: Payer: Medicare Other

## 2019-03-13 ENCOUNTER — Encounter (HOSPITAL_COMMUNITY): Payer: Self-pay

## 2019-03-13 ENCOUNTER — Telehealth (HOSPITAL_COMMUNITY): Payer: Self-pay | Admitting: Emergency Medicine

## 2019-03-13 NOTE — Telephone Encounter (Signed)
Left message on voicemail with name and callback number Blaze Sandin RN Navigator Cardiac Imaging [facility] [phone] Office [phone] Cell  

## 2019-03-14 ENCOUNTER — Other Ambulatory Visit: Payer: Self-pay

## 2019-03-14 ENCOUNTER — Ambulatory Visit (HOSPITAL_COMMUNITY)
Admission: RE | Admit: 2019-03-14 | Discharge: 2019-03-14 | Disposition: A | Discharge status: Home / Self Care | Payer: Medicare Other | Source: Ambulatory Visit | Attending: Interventional Cardiology | Admitting: Interventional Cardiology

## 2019-03-14 DIAGNOSIS — I110 Hypertensive heart disease with heart failure: Secondary | ICD-10-CM | POA: Insufficient documentation

## 2019-03-14 MED ORDER — NITROGLYCERIN 0.4 MG SL SUBL
SUBLINGUAL_TABLET | SUBLINGUAL | Status: AC
  Filled 2019-03-14: qty 2

## 2019-03-14 MED ORDER — NITROGLYCERIN 0.4 MG SL SUBL
0.8000 mg | SUBLINGUAL_TABLET | Freq: Once | SUBLINGUAL | Status: AC
  Administered 2019-03-14: 0.8 mg via SUBLINGUAL

## 2019-03-14 MED ORDER — IOHEXOL 350 MG/ML SOLN
80.0000 mL | Freq: Once | INTRAVENOUS | Status: AC | PRN
  Administered 2019-03-14: 10:00:00 80 mL via INTRAVENOUS

## 2019-03-15 ENCOUNTER — Telehealth: Payer: Self-pay | Admitting: Interventional Cardiology

## 2019-03-15 NOTE — Telephone Encounter (Signed)
Patient is returning call in regards to CT completed on 03/14/19. Please call to discuss.

## 2019-03-15 NOTE — Telephone Encounter (Signed)
Drue Novel I, RN  03/15/2019 5:04 PM EST    The patient has been notified of the result and verbalized understanding. All questions (if any) were answered. Cleon Gustin, RN 03/15/2019 5:04 PM    Drue Novel I, RN  03/15/2019 3:03 PM EST    Left message for patient to call back.   Jettie Booze, MD  03/15/2019 1:35 PM EST    Minimal nonobstructive CAD. Noncardiac chest pain. COntinue regular exercise and healthy diet.

## 2019-03-16 ENCOUNTER — Ambulatory Visit: Payer: Medicare Other | Attending: Internal Medicine

## 2019-03-16 DIAGNOSIS — Z23 Encounter for immunization: Secondary | ICD-10-CM | POA: Insufficient documentation

## 2019-03-16 NOTE — Progress Notes (Signed)
   Covid-19 Vaccination Clinic  Name:  Rutvi Attias    MRN: [id number] DOB: [birth year]  03/16/2019  Ms. Dewitz was observed post Covid-19 immunization for 15 minutes without incidence. She was provided with Vaccine Information Sheet and instruction to access the V-Safe system.   Ms. Debaun was instructed to call 911 with any severe reactions post vaccine: Marland Kitchen Difficulty breathing  . Swelling of your face and throat  . A fast heartbeat  . A bad rash all over your body  . Dizziness and weakness    Immunizations Administered    Name Date Dose VIS Date Route   Pfizer COVID-19 Vaccine 03/16/2019 10:45 AM 0.3 mL 01/18/2019 Intramuscular   Manufacturer: [place]   Lot: [id number]   [place]: [id number]

## 2019-03-21 ENCOUNTER — Other Ambulatory Visit: Payer: Self-pay | Admitting: Family Medicine

## 2019-04-05 ENCOUNTER — Ambulatory Visit
Admission: RE | Admit: 2019-04-05 | Discharge: 2019-04-05 | Disposition: A | Discharge status: Home / Self Care | Payer: Medicare Other | Source: Ambulatory Visit | Attending: Orthopaedic Surgery | Admitting: Orthopaedic Surgery

## 2019-04-05 ENCOUNTER — Other Ambulatory Visit: Payer: Self-pay

## 2019-04-05 DIAGNOSIS — M5440 Lumbago with sciatica, unspecified side: Secondary | ICD-10-CM

## 2019-04-05 DIAGNOSIS — M48061 Spinal stenosis, lumbar region without neurogenic claudication: Secondary | ICD-10-CM | POA: Diagnosis not present

## 2019-04-09 ENCOUNTER — Ambulatory Visit (INDEPENDENT_AMBULATORY_CARE_PROVIDER_SITE_OTHER): Payer: Medicare Other | Admitting: Orthopaedic Surgery

## 2019-04-09 ENCOUNTER — Other Ambulatory Visit: Payer: Self-pay

## 2019-04-09 ENCOUNTER — Encounter: Payer: Self-pay | Admitting: Orthopaedic Surgery

## 2019-04-09 VITALS — Ht 61.0 in | Wt 251.0 lb

## 2019-04-09 DIAGNOSIS — M5440 Lumbago with sciatica, unspecified side: Secondary | ICD-10-CM | POA: Diagnosis not present

## 2019-04-09 NOTE — Progress Notes (Signed)
Office Visit Note   Patient: Amanda Copeland           Date of Birth: [birth year]           MRN: [id number] Visit Date: 04/09/2019              Requested by: [name], MD [address] [place],  [place] [address] PCP: [name], MD   Assessment & Plan: Visit Diagnoses:  1. Bilateral low back pain with sciatica, sciatica laterality unspecified, unspecified chronicity     Plan: MRI scan of lumbar spine demonstrates diffuse facet arthropathy but particularly at L4-5 there is 3 mm of anterior listhesis and bulging of the disc.  There is stenosis of the canal and lateral recesses that could cause neural compression on either side.  The facet arthropathy could contribute to back pain or referred facet syndrome.  There was mild stenosis at L3-4 and mild stenosis at L2-3 but none at L5-S1.  Long discussion over 30 minutes with Amanda Copeland regarding the above.  I think a course of physical therapy would be helpful.  I will also asked Dr. Ernestina Patches to evaluate for possibility of an epidural steroid injection.  Return in 1 month. Amanda Copeland experiencing claudication tickly when she stands for a length of time or walks any distance.  I think this certainly could be consistent with her stenosis at L4-5.  Follow-Up Instructions: Return in about 1 month (around 05/10/2019).   Orders:  Orders Placed This Encounter  Procedures  . Ambulatory referral to Physical Therapy  . Ambulatory referral to Physical Medicine Rehab   No orders of the defined types were placed in this encounter.     Procedures: No procedures performed   Clinical Data: No additional findings.   Subjective: Chief Complaint  Patient presents with  . Lower Back - Follow-up    MRI results  Patient presents today for follow up on her lower back. She had an MRI on 04/05/2019 and is here today for those results. No changes since her last visit.   HPI  Review of Systems   Objective: Vital Signs: Ht 5'  1" (1.549 m)   Wt 251 lb (113.9 kg)   BMI 47.43 kg/m   Physical Exam Constitutional:      Appearance: She is well-developed.  Eyes:     Pupils: Pupils are equal, round, and reactive to light.  Pulmonary:     Effort: Pulmonary effort is normal.  Skin:    General: Skin is warm and dry.  Neurological:     Mental Status: She is alert and oriented to person, place, and time.  Psychiatric:        Behavior: Behavior normal.     Ortho Exam awake alert and oriented x3.  Comfortable sitting.  Straight leg raise is negative.  Large legs.  She does have morbid obesity.  No related subjective loss of sensibility or weakness of either lower extremity.  Painless range of motion both hips.  No percussible tenderness of lumbar spine  Specialty Comments:  No specialty comments available.  Imaging: No results found.   PMFS History: Patient Active Problem List   Diagnosis Date Noted  . Right shoulder pain 03/03/2019  . Primary osteoarthritis, right shoulder 02/28/2019  . Other forms of dyspnea 11/22/2018  . Educated about COVID-19 virus infection 07/03/2018  . Pain of right hip joint 03/18/2018  . Chronic nonallergic rhinitis 12/20/2017  . Cough, persistent 12/20/2017  . Right  flank pain 12/10/2017  . Fatigue 07/12/2017  . Morbid obesity ([place]) 03/05/2017  . Chronic diastolic heart failure ([place]) 11/29/2016  . Dyspnea on exertion 10/06/2016  . Encounter for Medicare annual wellness exam 05/22/2016  . Hyperglycemia 08/07/2015  . Allergic 08/07/2015  . Gout 08/07/2015  . Leukocytosis 08/07/2015  . Lower abdominal pain 04/21/2015  . Low back pain 11/02/2014  . Esophageal reflux 10/28/2014  . Essential hypertension, benign 10/28/2014  . Mixed hyperlipidemia 10/28/2014  . Benign essential tremor 10/28/2014  . Bilateral dry eyes   . Moderate persistent asthma   . Arthritis   . Cancer ([place])   . History of chicken pox    Past Medical History:  Diagnosis Date  . Arthritis   . Asthma     childhood now returning  . Back pain affecting pregnancy 11/02/2014  . Benign essential tremor 10/28/2014  . Bilateral dry eyes   . Breast cancer ([place])    right  . Cancer ([facility])    Breast  . Dry eyes   . Encounter for Medicare annual wellness exam 05/22/2016  . Essential hypertension, benign 10/28/2014  . H/O measles   . H/O mumps   . History of chicken pox   . Lumbago 11/02/2014  . Mixed hyperlipidemia 10/28/2014  . Obesity 10/28/2014    Family History  Problem Relation Age of Onset  . Heart disease Mother   . Heart attack Mother   . Heart failure Mother   . Hypertension Mother   . Cancer Father        colon  . Asthma Father   . Parkinson's disease Father   . Cancer Maternal Grandmother        uterine  . Heart disease Maternal Grandfather   . Obesity Daughter   . Appendicitis Paternal Grandmother   . Stroke Neg Hx     Past Surgical History:  Procedure Laterality Date  . BACK SURGERY    . BREAST REDUCTION SURGERY Left   . CHOLECYSTECTOMY    . EYE SURGERY  2008   b/l cataracts removed, in Accident  . MASTECTOMY Right   . REDUCTION MAMMAPLASTY    . REPLACEMENT TOTAL KNEE BILATERAL Bilateral   . ROTATOR CUFF REPAIR Left    Social History   Occupational History  . 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 Smoking status: Never Smoker  . Smokeless tobacco: Never Used  Substance and Sexual Activity  . Alcohol use: Yes    Alcohol/week: 0.0 standard drinks    Comment: two times a month  . Drug use: No  . Sexual activity: Not on file    Comment: lives with husband, moved [place], no dietary restrictions, retired Education officer, museum

## 2019-04-10 ENCOUNTER — Ambulatory Visit: Payer: Medicare Other | Attending: Internal Medicine

## 2019-04-10 DIAGNOSIS — Z23 Encounter for immunization: Secondary | ICD-10-CM | POA: Insufficient documentation

## 2019-04-10 NOTE — Progress Notes (Signed)
   Covid-19 Vaccination Clinic  Name:  Neosha Shimel    MRN: [id number] DOB: 1940/12/14  04/10/2019  Ms. Weghorst was observed post Covid-19 immunization for 15 minutes without incident. She was provided with Vaccine Information Sheet and instruction to access the V-Safe system.   Ms. Lewison was instructed to call 911 with any severe reactions post vaccine: Marland Kitchen Difficulty breathing  . Swelling of face and throat  . A fast heartbeat  . A bad rash all over body  . Dizziness and weakness   Immunizations Administered    Name Date Dose VIS Date Route   Pfizer COVID-19 Vaccine 04/10/2019  8:19 AM 0.3 mL 01/18/2019 Intramuscular   Manufacturer: [place]   Lot: [id number]   [place]: [id number]

## 2019-04-24 ENCOUNTER — Other Ambulatory Visit: Payer: Self-pay

## 2019-04-24 ENCOUNTER — Ambulatory Visit (INDEPENDENT_AMBULATORY_CARE_PROVIDER_SITE_OTHER): Payer: Medicare Other | Admitting: Allergy and Immunology

## 2019-04-24 ENCOUNTER — Encounter: Payer: Self-pay | Admitting: Allergy and Immunology

## 2019-04-24 VITALS — HR 66 | Temp 97.8°F | Resp 16

## 2019-04-24 DIAGNOSIS — K219 Gastro-esophageal reflux disease without esophagitis: Secondary | ICD-10-CM

## 2019-04-24 DIAGNOSIS — J310 Chronic rhinitis: Secondary | ICD-10-CM

## 2019-04-24 DIAGNOSIS — J4540 Moderate persistent asthma, uncomplicated: Secondary | ICD-10-CM

## 2019-04-24 DIAGNOSIS — R0609 Other forms of dyspnea: Secondary | ICD-10-CM

## 2019-04-24 MED ORDER — AZELASTINE-FLUTICASONE 137-50 MCG/ACT NA SUSP: NASAL | 5 refills | Status: DC

## 2019-04-24 NOTE — Patient Instructions (Addendum)
Moderate persistent asthma Persistent dyspnea and poor exercise tolerance.  The patient's history and spirometry results suggest that there may and etiology other than/in addition to asthma.  She reports that she was recently evaluated by her cardiologist with reassuring test results.  We will step up asthma therapy as a therapeutic trial.  Samples have been provided for BrezTri 160 g, 2 inhalations via spacer device twice a day.  Continue montelukast 10 mg daily bedtime and albuterol HFA, 1-2 inhalations every 4-6 hours as needed and 10 to 15 minutes prior to exertion/exercise.  I have encouraged using albuterol during times of dyspnea to assess for symptom improvement.  The patient will call to update me on her progress and if this problem persists or progresses despite treatment plan as outlined above, we will refer to pulmonology for further evaluation.  Chronic nonallergic rhinitis Stable.  Continue azelastine/fluticasone nasal spray, 1 spray per nostril twice daily.  This medication may be used as needed rather than on a scheduled basis.  Nasal saline spray (i.e., Simply Saline) or nasal saline lavage (i.e., NeilMed) is recommended as needed and prior to medicated nasal sprays.  Esophageal reflux  Continue appropriate reflux lifestyle modifications and Prevacid as prescribed.   Call next week with progress regarding the shortness of breath.

## 2019-04-24 NOTE — Progress Notes (Signed)
Follow-up Note  RE: Amanda Copeland MRN: [id number] DOB: 11-15-1940 Date of Office Visit: 04/24/2019  Primary care provider: [name], MD Referring provider: [name], MD  History of present illness: Amanda Copeland is a 79 y.o. female with persistent asthma, nonallergic rhinitis, and esophageal reflux presenting today for a sick visit.  She was last seen in this clinic in October 2020.  She reports that she is short of breath "all the time", particularly with exertion.  She is unable to make it up a flight of stairs without stopping and will breathe heavily after walking 10 yards on a flat surface.  She has not been experiencing coughing, wheezing, or chest tightness.  She does not experience nocturnal awakenings due to lower respiratory symptoms.  She is currently taking Flovent 110 g, 2 inhalations via spacer device twice daily, and montelukast 10 mg daily at bedtime.  She does not use albuterol frequently, and is uncertain if she experiences benefit from the albuterol when used.  She reports that she underwent cardiology evaluation in December 2020 with a normal EKG and unremarkable coronary calcium CT. She reports that her nasal and sinus symptoms are well controlled.  She uses Dymista nasal spray, 1 spray per nostril twice daily.  She reports that her reflux is well controlled.  Assessment and plan: Moderate persistent asthma Persistent dyspnea and poor exercise tolerance.  The patient's history and spirometry results suggest that there may and etiology other than/in addition to asthma.  She reports that she was recently evaluated by her cardiologist with reassuring test results.  We will step up asthma therapy as a therapeutic trial.  Samples have been provided for BrezTri 160 g, 2 inhalations via spacer device twice a day.  Continue montelukast 10 mg daily bedtime and albuterol HFA, 1-2 inhalations every 4-6 hours as needed and 10 to 15 minutes prior to exertion/exercise.  I  have encouraged using albuterol during times of dyspnea to assess for symptom improvement.  The patient will call to update me on her progress and if this problem persists or progresses despite treatment plan as outlined above, we will refer to pulmonology for further evaluation.  Chronic nonallergic rhinitis Stable.  Continue azelastine/fluticasone nasal spray, 1 spray per nostril twice daily.  This medication may be used as needed rather than on a scheduled basis.  Nasal saline spray (i.e., Simply Saline) or nasal saline lavage (i.e., NeilMed) is recommended as needed and prior to medicated nasal sprays.  Esophageal reflux  Continue appropriate reflux lifestyle modifications and Prevacid as prescribed.   Meds ordered this encounter  Medications  . Azelastine-Fluticasone (DYMISTA) 137-50 MCG/ACT SUSP    Sig: 1-2 sprays per nostril twice daily for runny nose    Dispense:  23 g    Refill:  5    dispense as written  dymista do not fill generic. The insurance will pay for dymista    Diagnostics: Spirometry reveals an FVC of 1.81 L and an FEV1 of 1.54 L (92% predicted) without significant postbronchodilator improvement.  Please see scanned spirometry results for details.    Physical examination: Pulse 66, temperature 97.8 F (36.6 C), temperature source Oral, resp. rate 16, SpO2 97 %.  General: Alert, interactive, in no acute distress. HEENT: TMs pearly gray, turbinates minimally edematous without discharge, post-pharynx unremarkable. Neck: Supple without lymphadenopathy. Lungs: Clear to auscultation without wheezing, rhonchi or rales. CV: Normal S1, S2 without murmurs. Skin: Warm and dry, without lesions or rashes.  The following portions of  the patient's history were reviewed and updated as appropriate: allergies, current medications, past family history, past medical history, past social history, past surgical history and problem list.   Current Outpatient Medications    Medication Sig Dispense Refill  . albuterol (PROVENTIL HFA;VENTOLIN HFA) 108 (90 Base) MCG/ACT inhaler INHALE 2 PUFFS INTO THE LUNGS EVERY 4 (FOUR) HOURS AS NEEDED FOR WHEEZING OR SHORTNESS OF BREATH. 6.7 Inhaler 1  . allopurinol (ZYLOPRIM) 100 MG tablet TAKE 2 TABLETS BY MOUTH EVERY DAY 180 tablet 0  . Ascorbic Acid (VITAMIN C PO) Take by mouth.    Marland Kitchen aspirin 81 MG tablet Take 81 mg by mouth daily.    . Azelastine-Fluticasone (DYMISTA) 137-50 MCG/ACT SUSP 1-2 sprays per nostril twice daily for runny nose 23 g 5  . Azelastine-Fluticasone 137-50 MCG/ACT SUSP 1 SPRAY PER NOSTRIL TWICE DAILY FOR RUNNY NOSE 23 g 0  . fluticasone (FLOVENT HFA) 110 MCG/ACT inhaler 2 PUFFS TWICE DAILY TO PREVENT COUGHING OR WHEEZING. 36 g 3  . KLOR-CON M20 20 MEQ tablet TAKE 1 TABLET BY MOUTH EVERY DAY 30 tablet 11  . levothyroxine (SYNTHROID) 112 MCG tablet TAKE 1 TABLET (112 MCG TOTAL) BY MOUTH DAILY BEFORE BREAKFAST. 90 tablet 1  . montelukast (SINGULAIR) 10 MG tablet TAKE 1 TABLET ONCE AT BEDTIME FOR COUGHING OR WHEEZING. 30 tablet 5  . Multiple Vitamins-Minerals (ICAPS AREDS 2) CAPS Take 1 capsule by mouth 2 (two) times daily.    . Multiple Vitamins-Minerals (ZINC PO) Take by mouth.    Marland Kitchen PREVACID 30 MG capsule TAKE 1 CAPSULE BY MOUTH TWICE A DAY --MAX LIMIT 60 capsule 5  . propranolol (INDERAL) 20 MG tablet Take 1 tablet (20 mg total) by mouth 2 (two) times daily. 180 tablet 3  . Red Yeast Rice Extract (RED YEAST RICE PO) Take by mouth daily.    . TIADYLT ER 180 MG 24 hr capsule TAKE 1 CAPSULE BY MOUTH EVERY DAY 90 capsule 1  . fluticasone (FLONASE) 50 MCG/ACT nasal spray USE 2 SPRAYS IN EACH NOSTRIL AT BEDTIME  11  . furosemide (LASIX) 40 MG tablet TAKE 1 TABLET (40 MG TOTAL) DAILY BY MOUTH. 90 tablet 3   No current facility-administered medications for this visit.    Allergies  Allergen Reactions  . Amoxicillin Rash  . Ampicillin Rash  . Penicillins Rash  . Statins Other (See Comments)    Per reports  muscle aches and joint pain Per reports muscle aches and joint pain    Review of systems: Review of systems negative except as noted in HPI / PMHx.   Past Medical History:  Diagnosis Date  . Arthritis   . Asthma    childhood now returning  . Back pain affecting pregnancy 11/02/2014  . Benign essential tremor 10/28/2014  . Bilateral dry eyes   . Breast cancer ([place])    right  . Cancer ([facility])    Breast  . Dry eyes   . Encounter for Medicare annual wellness exam 05/22/2016  . Essential hypertension, benign 10/28/2014  . H/O measles   . H/O mumps   . History of chicken pox   . Lumbago 11/02/2014  . Mixed hyperlipidemia 10/28/2014  . Obesity 10/28/2014  . Stenosis of cervical spine     Family History  Problem Relation Age of Onset  . Heart disease Mother   . Heart attack Mother   . Heart failure Mother   . Hypertension Mother   . Cancer Father  colon  . Asthma Father   . Parkinson's disease Father   . Cancer Maternal Grandmother        uterine  . Heart disease Maternal Grandfather   . Obesity Daughter   . Appendicitis Paternal Grandmother   . Stroke Neg Hx     Social History   Socioeconomic History  . Marital status: Married    Spouse name: Not on file  . Number of children: 1  . Years of education: Not on file  . Highest education level: Master's degree (e.g., MA, MS, MEng, MEd, MSW, MBA)  Occupational History  . 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 Smoking status: Never Smoker  . Smokeless tobacco: Never Used  Substance and Sexual Activity  . Alcohol use: Yes    Alcohol/week: 0.0 standard drinks    Comment: two times a month  . Drug use: No  . Sexual activity: Not on file    Comment: lives with husband, moved [place], no dietary restrictions, retired Education officer, museum  Other Topics Concern  . Not on file  Social History Narrative   Pt lives with Spouse at home 1 daughter   Drinks coffee, and soda   Right handed    Social Determinants of Health   Financial Resource Strain:   . Difficulty of Paying Living Expenses:   Food Insecurity:   . Worried About Charity fundraiser in the Last Year:   . Arboriculturist in the Last Year:   Transportation Needs:   . Film/video editor (Medical):   Marland Kitchen Lack of Transportation (Non-Medical):   Physical Activity:   . Days of Exercise per Week:   . Minutes of Exercise per Session:   Stress:   . Feeling of Stress :   Social Connections:   . Frequency of Communication with Friends and Family:   . Frequency of Social Gatherings with Friends and Family:   . Attends Religious Services:   . Active Member of Clubs or Organizations:   . Attends Archivist Meetings:   Marland Kitchen Marital Status:   Intimate Partner Violence:   . Fear of Current or Ex-Partner:   . Emotionally Abused:   Marland Kitchen Physically Abused:   . Sexually Abused:     I appreciate the opportunity to take part in Pammie's care. Please do not hesitate to contact me with questions.  Sincerely,   R. Edgar Frisk, MD

## 2019-04-24 NOTE — Assessment & Plan Note (Signed)
   Continue appropriate reflux lifestyle modifications and Prevacid as prescribed.

## 2019-04-24 NOTE — Assessment & Plan Note (Addendum)
Stable.  Continue azelastine/fluticasone nasal spray, 1 spray per nostril twice daily.  This medication may be used as needed rather than on a scheduled basis.  Nasal saline spray (i.e., Simply Saline) or nasal saline lavage (i.e., NeilMed) is recommended as needed and prior to medicated nasal sprays.

## 2019-04-24 NOTE — Assessment & Plan Note (Addendum)
Persistent dyspnea and poor exercise tolerance.  The patient's history and spirometry results suggest that there may and etiology other than/in addition to asthma.  She reports that she was recently evaluated by her cardiologist with reassuring test results.  We will step up asthma therapy as a therapeutic trial.  Samples have been provided for BrezTri 160 g, 2 inhalations via spacer device twice a day.  Continue montelukast 10 mg daily bedtime and albuterol HFA, 1-2 inhalations every 4-6 hours as needed and 10 to 15 minutes prior to exertion/exercise.  I have encouraged using albuterol during times of dyspnea to assess for symptom improvement.  The patient will call to update me on her progress and if this problem persists or progresses despite treatment plan as outlined above, we will refer to pulmonology for further evaluation.

## 2019-04-25 ENCOUNTER — Ambulatory Visit (INDEPENDENT_AMBULATORY_CARE_PROVIDER_SITE_OTHER): Payer: Medicare Other | Admitting: Physical Medicine and Rehabilitation

## 2019-04-25 ENCOUNTER — Encounter: Payer: Self-pay | Admitting: Physical Medicine and Rehabilitation

## 2019-04-25 ENCOUNTER — Ambulatory Visit: Payer: Self-pay

## 2019-04-25 VITALS — BP 163/79 | HR 68

## 2019-04-25 DIAGNOSIS — M5441 Lumbago with sciatica, right side: Secondary | ICD-10-CM

## 2019-04-25 DIAGNOSIS — M5416 Radiculopathy, lumbar region: Secondary | ICD-10-CM

## 2019-04-25 DIAGNOSIS — M48062 Spinal stenosis, lumbar region with neurogenic claudication: Secondary | ICD-10-CM

## 2019-04-25 MED ORDER — METHYLPREDNISOLONE ACETATE 80 MG/ML IJ SUSP
40.0000 mg | Freq: Once | INTRAMUSCULAR | Status: AC
  Administered 2019-04-25: 40 mg

## 2019-04-25 NOTE — Progress Notes (Signed)
Numeric Pain Rating Scale and Functional Assessment Average Pain 10   In the last MONTH (on 0-10 scale) has pain interfered with the following?  1. General activity like being  able to carry out your everyday physical activities such as walking, climbing stairs, carrying groceries, or moving a chair?  Rating(10)   +Driver, -BT, -Dye Allergies. 

## 2019-04-26 NOTE — Progress Notes (Signed)
Amanda Copeland - 79 y.o. female MRN [id number]  Date of birth: [birth year]  Office Visit Note: Visit Date: 04/25/2019 PCP: [name], MD Referred by: [name], MD  Subjective: Chief Complaint  Patient presents with  . Lower Back - Pain  . Right Thigh - Pain   HPI: Amanda Copeland is a 79 y.o. female who comes in today For planned right L5-S1 interlaminar steroid injection for low back and right hip and buttock pain.  Patient has a history of prior remote lumbar surgery which appears to be potentially on the left at L5-S1 reviewing the MRI.  She does have significant stenosis multifactorial 4-5.  She is failed conservative care and is in physical therapy.  She has been seen by Dr. Joni Fears who requested epidural injection.  She may actually have some level of back pain from the facet joints as well.  We discussed briefly facet joint pathology.  Exam does show pain with extension and facet loading.  ROS Otherwise per HPI.  Assessment & Plan: Visit Diagnoses:  1. Spinal stenosis of lumbar region with neurogenic claudication   2. Bilateral low back pain with right-sided sciatica, unspecified chronicity   3. Lumbar radiculopathy     Plan: No additional findings.   Meds & Orders:  Meds ordered this encounter  Medications  . methylPREDNISolone acetate (DEPO-MEDROL) injection 40 mg    Orders Placed This Encounter  Procedures  . XR C-ARM NO REPORT  . Epidural Steroid injection    Follow-up: Return if symptoms worsen or fail to improve.   Procedures: No procedures performed  Lumbar Epidural Steroid Injection - Interlaminar Approach with Fluoroscopic Guidance  Patient: Amanda Copeland      Date of Birth: 03-26-1940 MRN: [id number] PCP: [name], MD      Visit Date: 04/25/2019   Universal Protocol:     Consent Given By: the patient  Position: PRONE  Additional Comments: Vital signs were monitored before and after the procedure. Patient was prepped and  draped in the usual sterile fashion. The correct patient, procedure, and site was verified.   Injection Procedure Details:  Procedure Site One Meds Administered:  Meds ordered this encounter  Medications  . methylPREDNISolone acetate (DEPO-MEDROL) injection 40 mg     Laterality: Right  Location/Site:  L5-S1  Needle size: 20 G  Needle type: Tuohy  Needle Placement: Paramedian epidural  Findings:   -Comments: Excellent flow of contrast into the epidural space.  Procedure Details: Using a paramedian approach from the side mentioned above, the region overlying the inferior lamina was localized under fluoroscopic visualization and the soft tissues overlying this structure were infiltrated with 4 ml. of 1% Lidocaine without Epinephrine. The Tuohy needle was inserted into the epidural space using a paramedian approach.   The epidural space was localized using loss of resistance along with lateral and bi-planar fluoroscopic views.  After negative aspirate for air, blood, and CSF, a 2 ml. volume of Isovue-250 was injected into the epidural space and the flow of contrast was observed. Radiographs were obtained for documentation purposes.    The injectate was administered into the level noted above.   Additional Comments:  The patient tolerated the procedure well Dressing: 2 x 2 sterile gauze and Band-Aid    Post-procedure details: Patient was observed during the procedure. Post-procedure instructions were reviewed.  Patient left the clinic in stable condition.    Clinical History: MRI LUMBAR SPINE WITHOUT CONTRAST  TECHNIQUE: Multiplanar, multisequence MR imaging of the  lumbar spine was performed. No intravenous contrast was administered.  COMPARISON:  Radiography 02/28/2019.  FINDINGS: Segmentation:  5 lumbar type vertebral bodies.  Alignment: 3 mm degenerative anterolisthesis L4-5. 2 mm degenerative anterolisthesis L5-S1.  Vertebrae: Old superior endplate  Schmorl's node at T11. No other focal bone finding.  Conus medullaris and cauda equina: Conus extends to the T12-L1 level. Conus and cauda equina appear normal.  Paraspinal and other soft tissues: Negative  Disc levels:  No significant finding at L1-2 or above.  L2-3: Mild bulging of the disc. Bilateral facet degeneration with joint effusions and mild upper trapeze. Mild narrowing of the lateral recesses but no visible neural compression  L3-4: Mild bulging of the disc. Mild facet and ligamentous hypertrophy. Mild stenosis of the lateral recesses but no visible neural compression.  L4-5: Bilateral facet arthropathy with 3 mm of anterolisthesis. Bulging of the disc. Stenosis of the canal and lateral recesses that could cause neural compression on either or both sides.  L5-S1: Facet osteoarthritis with 2 mm of anterolisthesis. No disc bulge or herniation. No compressive stenosis.  IMPRESSION: L4-5: Bilateral facet arthropathy with 3 mm of anterolisthesis. Bulging of the disc. Stenosis of the canal and lateral recesses that could cause neural compression on either or both sides. The facet arthropathy could certainly contribute to back pain or referred facet syndrome pain.  L3-4: Disc bulge. Facet hypertrophy. Mild stenosis but no visible neural compression.  L2-3: Disc bulge. Facet osteoarthritis with joint effusions. Mild stenosis but no visible neural compression. The facet arthritis could be painful.   Electronically Signed   By: Nelson Chimes M.D.   On: 04/06/2019 04:44   She reports that she has never smoked. She has never used smokeless tobacco.  Recent Labs    10/08/18 1017 02/26/19 1137  HGBA1C 5.6 5.5  LABURIC 7.7* 5.6    Objective:  VS:  HT:    WT:   BMI:     BP:(!) 163/79  HR:68bpm  TEMP: ( )  RESP:  Physical Exam  Ortho Exam Imaging: XR C-ARM NO REPORT  Result Date: 04/25/2019 Please see Notes tab for imaging  impression.   Past Medical/Family/Surgical/Social History: Medications & Allergies reviewed per EMR, new medications updated. Patient Active Problem List   Diagnosis Date Noted  . Right shoulder pain 03/03/2019  . Primary osteoarthritis, right shoulder 02/28/2019  . Other forms of dyspnea 11/22/2018  . Educated about COVID-19 virus infection 07/03/2018  . Pain of right hip joint 03/18/2018  . Chronic nonallergic rhinitis 12/20/2017  . Cough, persistent 12/20/2017  . Right flank pain 12/10/2017  . Fatigue 07/12/2017  . Morbid obesity ([place]) 03/05/2017  . Chronic diastolic heart failure ([place]) 11/29/2016  . Dyspnea on exertion 10/06/2016  . Encounter for Medicare annual wellness exam 05/22/2016  . Hyperglycemia 08/07/2015  . Allergic 08/07/2015  . Gout 08/07/2015  . Leukocytosis 08/07/2015  . Lower abdominal pain 04/21/2015  . Low back pain 11/02/2014  . Esophageal reflux 10/28/2014  . Essential hypertension, benign 10/28/2014  . Mixed hyperlipidemia 10/28/2014  . Benign essential tremor 10/28/2014  . Bilateral dry eyes   . Moderate persistent asthma   . Arthritis   . Cancer ([place])   . History of chicken pox    Past Medical History:  Diagnosis Date  . Arthritis   . Asthma    childhood now returning  . Back pain affecting pregnancy 11/02/2014  . Benign essential tremor 10/28/2014  . Bilateral dry eyes   . Breast cancer ([place])  right  . Cancer (HCC)    Breast  . Dry eyes   . Encounter for Medicare annual wellness exam 05/22/2016  . Essential hypertension, benign 10/28/2014  . H/O measles   . H/O mumps   . History of chicken pox   . Lumbago 11/02/2014  . Mixed hyperlipidemia 10/28/2014  . Obesity 10/28/2014  . Stenosis of cervical spine    Family History  Problem Relation Age of Onset  . Heart disease Mother   . Heart attack Mother   . Heart failure Mother   . Hypertension Mother   . Cancer Father        colon  . Asthma Father   . Parkinson's disease Father   .  Cancer Maternal Grandmother        uterine  . Heart disease Maternal Grandfather   . Obesity Daughter   . Appendicitis Paternal Grandmother   . Stroke Neg Hx    Past Surgical History:  Procedure Laterality Date  . BACK SURGERY    . BREAST REDUCTION SURGERY Left   . CHOLECYSTECTOMY    . EYE SURGERY  2008   b/l cataracts removed, in [place]  . MASTECTOMY Right   . REDUCTION MAMMAPLASTY    . REPLACEMENT TOTAL KNEE BILATERAL Bilateral   . ROTATOR CUFF REPAIR Left    Social History   Occupational History  . 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 Smoking status: Never Smoker  . Smokeless tobacco: Never Used  Substance and Sexual Activity  . Alcohol use: Yes    Alcohol/week: 0.0 standard drinks    Comment: two times a month  . Drug use: No  . Sexual activity: Not on file    Comment: lives with husband, moved [place], no dietary restrictions, retired Education officer, [employer]

## 2019-04-26 NOTE — Procedures (Signed)
Lumbar Epidural Steroid Injection - Interlaminar Approach with Fluoroscopic Guidance  Patient: Amanda Copeland      Date of Birth: September 25, 1940 MRN: [id number] PCP: [name], MD      Visit Date: 04/25/2019   Universal Protocol:     Consent Given By: the patient  Position: PRONE  Additional Comments: Vital signs were monitored before and after the procedure. Patient was prepped and draped in the usual sterile fashion. The correct patient, procedure, and site was verified.   Injection Procedure Details:  Procedure Site One Meds Administered:  Meds ordered this encounter  Medications  . methylPREDNISolone acetate (DEPO-MEDROL) injection 40 mg     Laterality: Right  Location/Site:  L5-S1  Needle size: 20 G  Needle type: Tuohy  Needle Placement: Paramedian epidural  Findings:   -Comments: Excellent flow of contrast into the epidural space.  Procedure Details: Using a paramedian approach from the side mentioned above, the region overlying the inferior lamina was localized under fluoroscopic visualization and the soft tissues overlying this structure were infiltrated with 4 ml. of 1% Lidocaine without Epinephrine. The Tuohy needle was inserted into the epidural space using a paramedian approach.   The epidural space was localized using loss of resistance along with lateral and bi-planar fluoroscopic views.  After negative aspirate for air, blood, and CSF, a 2 ml. volume of Isovue-250 was injected into the epidural space and the flow of contrast was observed. Radiographs were obtained for documentation purposes.    The injectate was administered into the level noted above.   Additional Comments:  The patient tolerated the procedure well Dressing: 2 x 2 sterile gauze and Band-Aid    Post-procedure details: Patient was observed during the procedure. Post-procedure instructions were reviewed.  Patient left the clinic in stable condition.

## 2019-05-01 ENCOUNTER — Other Ambulatory Visit: Payer: Self-pay

## 2019-05-01 ENCOUNTER — Ambulatory Visit: Payer: Medicare Other | Attending: Orthopaedic Surgery | Admitting: Physical Therapy

## 2019-05-01 DIAGNOSIS — G8929 Other chronic pain: Secondary | ICD-10-CM | POA: Insufficient documentation

## 2019-05-01 DIAGNOSIS — R262 Difficulty in walking, not elsewhere classified: Secondary | ICD-10-CM | POA: Insufficient documentation

## 2019-05-01 DIAGNOSIS — M6281 Muscle weakness (generalized): Secondary | ICD-10-CM | POA: Insufficient documentation

## 2019-05-01 DIAGNOSIS — R293 Abnormal posture: Secondary | ICD-10-CM | POA: Insufficient documentation

## 2019-05-01 DIAGNOSIS — M5440 Lumbago with sciatica, unspecified side: Secondary | ICD-10-CM | POA: Insufficient documentation

## 2019-05-01 DIAGNOSIS — R2681 Unsteadiness on feet: Secondary | ICD-10-CM | POA: Diagnosis not present

## 2019-05-01 NOTE — Patient Instructions (Signed)
    Home exercise program created by Petula Rotolo, PT.  For questions, please contact Chalonda Schlatter via phone at [phone] or email at [email or web address]  [facility] [place] [address]  [address] [place], [place], [address] Phone: [phone]   Fax:  [phone]    

## 2019-05-01 NOTE — Therapy (Signed)
[facility] [place] [address]  [place], [place], [address] Phone: [phone]   Fax:  [phone]  Physical Therapy Evaluation  Patient Details  Name: Amanda Copeland MRN: [id number] Date of Birth: [name] 01, 1941 Referring Provider (PT): Joni Fears, MD   Encounter Date: 05/01/2019  PT End of Session - 05/01/19 1059    Visit Number  1    Number of Visits  4    Date for PT Re-Evaluation  05/29/19    Authorization Type  Medicare & BCBS    PT Start Time  1059    PT Stop Time  1146    PT Time Calculation (min)  47 min    Activity Tolerance  Patient tolerated treatment well;Treatment limited secondary to medical complications (Comment)   SOB/ DOE   Behavior During Therapy  [name] for tasks assessed/performed       Past Medical History:  Diagnosis Date  . Arthritis   . Asthma    childhood now returning  . Back pain affecting pregnancy 11/02/2014  . Benign essential tremor 10/28/2014  . Bilateral dry eyes   . Breast cancer ([place])    right  . Cancer (HCC)    Breast  . Dry eyes   . Encounter for Medicare annual wellness exam 05/22/2016  . Essential hypertension, benign 10/28/2014  . H/O measles   . H/O mumps   . History of chicken pox   . Lumbago 11/02/2014  . Mixed hyperlipidemia 10/28/2014  . Obesity 10/28/2014  . Stenosis of cervical spine     Past Surgical History:  Procedure Laterality Date  . BACK SURGERY    . BREAST REDUCTION SURGERY Left   . CHOLECYSTECTOMY    . EYE SURGERY  2008   b/l cataracts removed, in [place]  . MASTECTOMY Right   . REDUCTION MAMMAPLASTY    . REPLACEMENT TOTAL KNEE BILATERAL Bilateral   . ROTATOR CUFF REPAIR Left     There were no vitals filed for this visit.   Subjective Assessment - 05/01/19 1104    Subjective  Pt reports chronic LBP with worsening lumbar arthritis and stenosis with pain worsening over past year. Pain would limit activity to a few minutes at a time and made  moving and walking difficult. Received ESI on 04/25/19 with some relief noted but still feels limited with walking. Also notes endurance limitations due to breathing issues.    Pertinent History  hx of surgery for "ruptured disc", hx of breast cancerChronic LBP with h/o surgery for "ruptured disc", spinal stenosis, OA, B TKA, L RTC repair, chronic diastolic HF, HLD, moderate asthma, DOE, HTN, breast cancer, benign essential tremor, morbid obesity    Limitations  Standing;Walking;House hold activities    Patient Stated Goals  "exercise program for my back"    Currently in Pain?  No/denies    Pain Score  0-No pain   1/10 at worst since ESI; up to 10/10 prior to [facility]   Pain Location  Back    Pain Orientation  Lower    Pain Descriptors / Indicators  --   "intense"   Pain Type  Chronic pain    Pain Onset  --   ~1 yr   Pain Frequency  Intermittent    Aggravating Factors   reaching, bending    Pain Relieving Factors  Advil or Aleve, Aspercreme cream or patch, heat or ice - minimal relief from either    Effect of Pain on Daily Activities  difficulty with standing tolerance for showering, difficulty watering plants, difficulty unpacking groceries         [place] Copeland PT Assessment - 05/01/19 1059      Assessment   Medical Diagnosis  B chronic LBP    Referring Provider (PT)  Joni Fears, MD    Onset Date/Surgical Date  --   chronic with current exacerbation x ~1 yr   Hand Dominance  Right    Next MD Visit  PRN    Prior Therapy  PT for LBP in 2018 + 2 other PT episodes for balance/unsteadiness      Precautions   Precautions  None      Restrictions   Weight Bearing Restrictions  No      Balance Screen   Has the patient fallen in the past 6 months  No    Has the patient had a decrease in activity level because of a fear of falling?   Yes    Is the patient reluctant to leave their home because of a fear of falling?   No      Home Environment   Living Environment  Private residence     Living Arrangements  Spouse/significant other    Type of [place] Access  Level entry    [place] - single point   only use in community esp where there might be stairs     Prior Function   Level of [place]  Retired    Leisure  mostly sedentary, swimming/water walking during summer      Cognition   Overall Cognitive Status  Within Functional Limits for tasks assessed      Observation/Other Assessments   Focus on Therapeutic Outcomes (FOTO)   Lumbar spine - 57% (43% limitation); Predicted 60% (40% limitation)      Sensation   Light Touch  Appears Intact      Coordination   Gross Motor Movements are Fluid and Coordinated  Yes      Posture/Postural Control   Posture/Postural Control  Postural limitations    Postural Limitations  Rounded Shoulders;Forward head;Increased thoracic kyphosis      Strength   Right Hip Flexion  4-/5    Right Hip Extension  3+/5    Right Hip External Rotation   4-/5    Right Hip Internal Rotation  4/5    Right Hip ABduction  3+/5    Left Hip Flexion  4-/5    Left Hip Extension  3+/5    Left Hip External Rotation  4-/5    Left Hip Internal Rotation  4/5    Right Knee Flexion  5/5    Right Knee Extension  4+/5    Left Knee Flexion  5/5    Left Knee Extension  4+/5    Right Ankle Dorsiflexion  4+/5    Left Ankle Dorsiflexion  4+/5                Objective measurements completed on examination: See above findings.      [facility] Adult PT Treatment/Exercise - 05/01/19 1059      Exercises   Exercises  Lumbar      Lumbar Exercises: Stretches   Lower Trunk Rotation Limitations  10 x 5"      Lumbar Exercises: Supine   Pelvic Tilt  10 reps;5 seconds    Clam  10 reps;3 seconds  Clam Limitations  bent knee fall-out    Bent Knee Raise  10 reps;3 seconds    Bent Knee Raise Limitations  brace marching    Other Supine Lumbar Exercises  Abd bracing + hip  adduction pillow squeeze 10 x 5"             PT Education - 05/01/19 1144    Education Details  PT eval findings, anticipated POC & initial HEP    Person(s) Educated  Patient    Methods  Explanation;Demonstration;Verbal cues;Tactile cues;Handout    Comprehension  Verbalized understanding;Returned demonstration;Verbal cues required;Tactile cues required;Need further instruction          PT Long Term Goals - 05/01/19 1146      PT LONG TERM GOAL #1   Title  Independent with advanced/ongoing HEP    Status  New    Target Date  05/29/19      PT LONG TERM GOAL #2   Title  Increase overall LE strength to >/= 4-/5 to 4/5 for improved stability and activity tolerance    Status  New    Target Date  05/29/19      PT LONG TERM GOAL #3   Title  Patient to report ability to perform ADLs, household and work-related tasks without increased LBP    Status  New    Target Date  05/29/19             Plan - 05/01/19 1429    Clinical Impression Statement  Amanda Copeland is a 79 y/o female who presents to OP PT for chronic B LBP. She reports long h/o LBP with remote h/o lumbar surgery for "ruptured disc" with current exacerbation beginning about 1 year ago. Pain had progressed to the point where it would reach 10/10 with only a few minutes of triggering activities causing her great difficulty with walking or standing tolerance for showering, watering plants or unpacking groceries. She received an ESI on 04/25/19 and notes pain has improved but walking still does not feel right. She states MD referred her to PT for training in a HEP for her LBP. She was previously seen by PT in 2018 for LBP and 2016 for LE weakness with LBP as well as another PT episode in 2019 for gait instability but has not kept up with any of the prescribed HEP exercises from any of these episodes. Lumbar ROM assessment deferred due to concerns regarding balance but significant proximal B LE weakness and decreased core activation  noted. Amanda Copeland will benefit from skilled PT services to address above impairments and allow for performance of normal daily activities with decreased pain interference.    Personal Factors and Comorbidities  Time since onset of injury/illness/exacerbation;Past/Current Experience;Comorbidity 3+;Age;Fitness    Comorbidities  Chronic LBP with h/o surgery for "ruptured disc", spinal stenosis, OA, B TKA, L RTC repair, chronic diastolic HF, HLD, moderate asthma, DOE, HTN, breast cancer, benign essential tremor, morbid obesity    Examination-Activity Limitations  Bathing;Bed Mobility;Bend;Caring for Others;Carry;Dressing;Hygiene/Grooming;Lift;Locomotion Level;Reach Overhead;Sit;Sleep;Squat;Stairs;Stand;Toileting;Transfers    Examination-Participation Restrictions  Cleaning;Community Activity;Interpersonal Relationship;Laundry;Meal Prep;Shop;Yard Work    Stability/Clinical Decision Making  Evolving/Moderate complexity    Clinical Decision Making  Moderate    Rehab Potential  Good    PT Frequency  1x / week    PT Duration  4 weeks    PT Treatment/Interventions  ADLs/Self Care Home Management;Cryotherapy;Electrical Stimulation;Iontophoresis 4mg /ml Dexamethasone;Moist Heat;Traction;Ultrasound;DME Instruction;Gait training;Stair training;Functional mobility training;Therapeutic activities;Therapeutic exercise;Balance training;Neuromuscular re-education;Patient/family education;Manual techniques;Passive range of motion;Dry needling;Taping;Spinal Manipulations;Joint Manipulations  PT Next Visit Plan  Review initial HEP and porgress as appropriate; posture & body mechanics education    PT Home Exercise Plan  05/01/19 - abd bracing/pelvic tilt + bent-knee fall-out, brace marching, hip adduction pillow squeeze    Consulted and Agree with Plan of Care  Patient       Patient will benefit from skilled therapeutic intervention in order to improve the following deficits and impairments:  Abnormal gait, Cardiopulmonary  status limiting activity, Decreased activity tolerance, Decreased balance, Decreased endurance, Decreased knowledge of precautions, Decreased mobility, Decreased range of motion, Decreased strength, Difficulty walking, Increased muscle spasms, Impaired perceived functional ability, Impaired flexibility, Improper body mechanics, Postural dysfunction, Pain  Visit Diagnosis: Chronic bilateral low back pain with sciatica, sciatica laterality unspecified  Muscle weakness (generalized)  Difficulty in walking, not elsewhere classified  Abnormal posture     Problem List Patient Active Problem List   Diagnosis Date Noted  . Right shoulder pain 03/03/2019  . Primary osteoarthritis, right shoulder 02/28/2019  . Other forms of dyspnea 11/22/2018  . Educated about COVID-19 virus infection 07/03/2018  . Pain of right hip joint 03/18/2018  . Chronic nonallergic rhinitis 12/20/2017  . Cough, persistent 12/20/2017  . Right flank pain 12/10/2017  . Fatigue 07/12/2017  . Morbid obesity ([place]) 03/05/2017  . Chronic diastolic heart failure ([place]) 11/29/2016  . Dyspnea on exertion 10/06/2016  . Encounter for Medicare annual wellness exam 05/22/2016  . Hyperglycemia 08/07/2015  . Allergic 08/07/2015  . Gout 08/07/2015  . Leukocytosis 08/07/2015  . Lower abdominal pain 04/21/2015  . Low back pain 11/02/2014  . Esophageal reflux 10/28/2014  . Essential hypertension, benign 10/28/2014  . Mixed hyperlipidemia 10/28/2014  . Benign essential tremor 10/28/2014  . Bilateral dry eyes   . Moderate persistent asthma   . Arthritis   . Cancer ([place])   . History of chicken pox     Percival Spanish, PT, MPT 05/01/2019, 2:58 PM  Amanda Copeland [address]  [place], [place], [address] Phone: [phone]   Fax:  [phone]  Name: Amanda Copeland MRN: [id number] Date of Birth: Sep 02, 1940

## 2019-05-03 ENCOUNTER — Other Ambulatory Visit: Payer: Self-pay

## 2019-05-03 ENCOUNTER — Ambulatory Visit (INDEPENDENT_AMBULATORY_CARE_PROVIDER_SITE_OTHER): Payer: Medicare Other | Admitting: Family Medicine

## 2019-05-03 ENCOUNTER — Encounter: Payer: Self-pay | Admitting: Family Medicine

## 2019-05-03 DIAGNOSIS — H6981 Other specified disorders of Eustachian tube, right ear: Secondary | ICD-10-CM | POA: Diagnosis not present

## 2019-05-03 DIAGNOSIS — J029 Acute pharyngitis, unspecified: Secondary | ICD-10-CM | POA: Diagnosis not present

## 2019-05-03 MED ORDER — AZITHROMYCIN 250 MG PO TABS: ORAL_TABLET | ORAL | 0 refills | Status: DC

## 2019-05-03 MED ORDER — METHYLPREDNISOLONE 4 MG PO TBPK: ORAL_TABLET | ORAL | 0 refills | Status: DC

## 2019-05-03 NOTE — Progress Notes (Signed)
Chief Complaint  Patient presents with  . Sore Throat  . Ear Pain    Amanda Copeland here for URI complaints. Due to COVID-19 pandemic, we are interacting via web portal for an electronic face-to-face visit. I verified patient's ID using 2 identifiers. Patient agreed to proceed with visit via this method. Patient is at home, I am at office. Patient, her husband [name], and I are present for visit.   Duration: 4 days  Associated symptoms: sore throat Denies: sinus congestion, sinus pain, rhinorrhea, itchy watery eyes, ear drainage, wheezing, shortness of breath, myalgia and fevers Treatment to date: Advil, Alk-Selzter Sick contacts: No  Past Medical History:  Diagnosis Date  . Arthritis   . Asthma    childhood now returning  . Back pain affecting pregnancy 11/02/2014  . Benign essential tremor 10/28/2014  . Bilateral dry eyes   . Breast cancer ([place])    right  . Cancer (HCC)    Breast  . Dry eyes   . Encounter for Medicare annual wellness exam 05/22/2016  . Essential hypertension, benign 10/28/2014  . H/O measles   . H/O mumps   . History of chicken pox   . Lumbago 11/02/2014  . Mixed hyperlipidemia 10/28/2014  . Obesity 10/28/2014  . Stenosis of cervical spine    Exam No conversational dyspnea Age appropriate judgment and insight Nml affect and mood  Dysfunction of right eustachian tube - Plan: methylPREDNISolone (MEDROL DOSEPAK) 4 MG TBPK tablet  Sore throat - Plan: azithromycin (ZITHROMAX) 250 MG tablet  Pocket rx to use in 2 days if steroid does not work.  Continue to push fluids, practice good hand hygiene, cover mouth when coughing. F/u prn. If starting to experience fevers, shaking, or shortness of breath, seek immediate care. Pt voiced understanding and agreement to the plan.  [place], DO 05/03/19 8:13 AM

## 2019-05-06 ENCOUNTER — Telehealth: Payer: Self-pay | Admitting: Allergy and Immunology

## 2019-05-06 NOTE — Telephone Encounter (Signed)
Okay to refill Breztri 160mg ?

## 2019-05-06 NOTE — Telephone Encounter (Signed)
Patient was in office on March 17th and was given a sample of an inhaler.  She states Dr. Verlin Fester told he to let us know if it worked for her and if so a prescription can be called in.  She says it seems to be working and would like that prescription called in.

## 2019-05-07 ENCOUNTER — Other Ambulatory Visit: Payer: Self-pay

## 2019-05-07 MED ORDER — BREZTRI AEROSPHERE 160-9-4.8 MCG/ACT IN AERO: 2.0000 | INHALATION_SPRAY | Freq: Two times a day (BID) | RESPIRATORY_TRACT | 5 refills | Status: DC

## 2019-05-07 NOTE — Telephone Encounter (Signed)
-----   Message from Adelina Mings, MD sent at 05/07/2019  9:09 AM EDT ----- Please send in a prescription for brezTri 160, 2 puffs bid. Thanks.

## 2019-05-07 NOTE — Telephone Encounter (Signed)
Medication sent to [facility]

## 2019-05-08 ENCOUNTER — Other Ambulatory Visit: Payer: Self-pay

## 2019-05-08 ENCOUNTER — Ambulatory Visit: Payer: Medicare Other | Admitting: Physical Therapy

## 2019-05-08 VITALS — BP 124/78 | HR 61

## 2019-05-08 DIAGNOSIS — M5440 Lumbago with sciatica, unspecified side: Secondary | ICD-10-CM | POA: Diagnosis not present

## 2019-05-08 DIAGNOSIS — R2681 Unsteadiness on feet: Secondary | ICD-10-CM | POA: Diagnosis not present

## 2019-05-08 DIAGNOSIS — G8929 Other chronic pain: Secondary | ICD-10-CM

## 2019-05-08 DIAGNOSIS — R262 Difficulty in walking, not elsewhere classified: Secondary | ICD-10-CM | POA: Diagnosis not present

## 2019-05-08 DIAGNOSIS — R293 Abnormal posture: Secondary | ICD-10-CM

## 2019-05-08 DIAGNOSIS — M6281 Muscle weakness (generalized): Secondary | ICD-10-CM | POA: Diagnosis not present

## 2019-05-08 NOTE — Patient Instructions (Addendum)

## 2019-05-08 NOTE — Therapy (Signed)
[facility] [address]  [place], [place], [address] Phone: [phone]   Fax:  [phone]  Physical Therapy Treatment  Patient Details  Name: Amanda Copeland MRN: [id number] Date of Birth: 11-15-1940 Referring Provider (PT): Joni Fears, MD   Encounter Date: 05/08/2019  PT End of Session - 05/08/19 1111    Visit Number  2    Number of Visits  4    Date for PT Re-Evaluation  05/29/19    Authorization Type  Medicare & BCBS    PT Start Time  1101    PT Stop Time  1149    PT Time Calculation (min)  48 min    Activity Tolerance  Patient tolerated treatment well;Treatment limited secondary to medical complications (Comment);Patient limited by fatigue   SOB/DOE   Behavior During Therapy  [facility] for tasks assessed/performed       Past Medical History:  Diagnosis Date  . Arthritis   . Asthma    childhood now returning  . Back pain affecting pregnancy 11/02/2014  . Benign essential tremor 10/28/2014  . Bilateral dry eyes   . Breast cancer ([place])    right  . Cancer (HCC)    Breast  . Dry eyes   . Encounter for Medicare annual wellness exam 05/22/2016  . Essential hypertension, benign 10/28/2014  . H/O measles   . H/O mumps   . History of chicken pox   . Lumbago 11/02/2014  . Mixed hyperlipidemia 10/28/2014  . Obesity 10/28/2014  . Stenosis of cervical spine     Past Surgical History:  Procedure Laterality Date  . BACK SURGERY    . BREAST REDUCTION SURGERY Left   . CHOLECYSTECTOMY    . EYE SURGERY  2008   b/l cataracts removed, in [place]  . MASTECTOMY Right   . REDUCTION MAMMAPLASTY    . REPLACEMENT TOTAL KNEE BILATERAL Bilateral   . ROTATOR CUFF REPAIR Left     Vitals:   05/08/19 1108  BP: 124/78  Pulse: 61  SpO2: 94%    Subjective Assessment - 05/08/19 1108    Subjective  Pt request to defer warm-up as she is already SOB from walk into clinic. States her MD is working with her MD to manage her  breathing and she started a new "puffer" this morning. She may have to see a pulmonologist.    Currently in Pain?  No/denies                       [facility] Adult PT Treatment/Exercise - 05/08/19 1101      Self-Care   Self-Care  Posture    Posture  Provided education in proper posture and body mechanics for typical daily tasks.      Exercises   Exercises  Lumbar      Lumbar Exercises: Supine   Clam  10 reps;3 seconds    Clam Limitations  alt hip ABD/ER with yellow TB    Bent Knee Raise  10 reps;3 seconds    Bent Knee Raise Limitations  brace marching with yellow TB    Bridge  10 reps;3 seconds    Bridge Limitations  + yellow TB hip ABD isometric - very limited lift; cues to avoid digging in with heels to minimize HS cramping      Lumbar Exercises: Sidelying   Clam  Right;Left;10 reps;3 seconds    Clam Limitations  yellow TB at knees  PT Education - 05/08/19 1148    Education Details  HEP update - yellow TB hooklying clams, marching & hip ABD isometric bridge, sidelying clam w/ or w/o yellow TB    Person(s) Educated  Patient    Methods  Explanation;Demonstration;Verbal cues;Tactile cues;Handout    Comprehension  Verbalized understanding;Returned demonstration;Verbal cues required;Tactile cues required;Need further instruction       PT Short Term Goals - 08/21/17 0935      PT SHORT TERM GOAL #1   Title  Independent with initial HEP ([place] program at supported level as inidicated)    Status  Achieved      PT SHORT TERM GOAL #2   Title  Increase overall LE strength by 1/2 - 1 grade for improved stability    Status  Achieved      PT SHORT TERM GOAL #3   Title  Increase Berg to 38/56 to reduce fall risk    Status  Achieved        PT Long Term Goals - 05/08/19 1145      PT LONG TERM GOAL #1   Title  Independent with advanced/ongoing HEP    Status  On-going    Target Date  05/29/19      PT LONG TERM GOAL #2   Title  Increase overall LE  strength to >/= 4-/5 to 4/5 for improved stability and activity tolerance    Status  On-going    Target Date  05/29/19      PT LONG TERM GOAL #3   Title  Patient to report ability to perform ADLs, household and work-related tasks without increased LBP    Status  On-going    Target Date  05/29/19            Plan - 05/08/19 1111    Clinical Impression Statement  [name] noting ongoing issues with SOB/DOE, currently being managed by PCP with new meds as of today - patient deferring warm-up due to breathing issues. She denies any concerns or need for review with initial HEP, therefore progressed some exercises with addition of yellow TB resistance and alternative positioning to increase gravity influence on resistance. Remainder of session focusing on posture and body mechanics education for typical daily task with patient noting some areas for potential improvement but also stating that her husband manages the majority of household chores.    Personal Factors and Comorbidities  Time since onset of injury/illness/exacerbation;Past/Current Experience;Comorbidity 3+;Age;Fitness    Comorbidities  Chronic LBP with h/o surgery for "ruptured disc", spinal stenosis, OA, B TKA, L RTC repair, chronic diastolic HF, HLD, moderate asthma, DOE, HTN, breast cancer, benign essential tremor, morbid obesity    Examination-Activity Limitations  Bathing;Bed Mobility;Bend;Caring for Others;Carry;Dressing;Hygiene/Grooming;Lift;Locomotion Level;Reach Overhead;Sit;Sleep;Squat;Stairs;Stand;Toileting;Transfers    Examination-Participation Restrictions  Cleaning;Community Activity;Interpersonal Relationship;Laundry;Meal Prep;Shop;Yard Work    Rehab Potential  Good    PT Frequency  1x / week    PT Duration  4 weeks    PT Treatment/Interventions  ADLs/Self Care Home Management;Cryotherapy;Electrical Stimulation;Iontophoresis 4mg /ml Dexamethasone;Moist Heat;Traction;Ultrasound;DME Instruction;Gait training;Stair  training;Functional mobility training;Therapeutic activities;Therapeutic exercise;Balance training;Neuromuscular re-education;Patient/family education;Manual techniques;Passive range of motion;Dry needling;Taping;Spinal Manipulations;Joint Manipulations    PT Next Visit Plan  lumbopelvic/LE strengthening - porgress as appropriate with HEP update as indicated; address any concerns re: posture & body mechanics education    PT Home Exercise Plan  05/01/19 - abd bracing/pelvic tilt + bent-knee fall-out, brace marching, hip adduction pillow squeeze; 05/08/19 - yellow TB hooklying clams, marching & hip ABD isometric bridge, sidelying clam w/ or  w/o yellow TB    Consulted and Agree with Plan of Care  Patient       Patient will benefit from skilled therapeutic intervention in order to improve the following deficits and impairments:  Abnormal gait, Cardiopulmonary status limiting activity, Decreased activity tolerance, Decreased balance, Decreased endurance, Decreased knowledge of precautions, Decreased mobility, Decreased range of motion, Decreased strength, Difficulty walking, Increased muscle spasms, Impaired perceived functional ability, Impaired flexibility, Improper body mechanics, Postural dysfunction, Pain  Visit Diagnosis: Chronic bilateral low back pain with sciatica, sciatica laterality unspecified  Muscle weakness (generalized)  Difficulty in walking, not elsewhere classified  Abnormal posture  Unsteadiness on feet     Problem List Patient Active Problem List   Diagnosis Date Noted  . Right shoulder pain 03/03/2019  . Primary osteoarthritis, right shoulder 02/28/2019  . Other forms of dyspnea 11/22/2018  . Educated about COVID-19 virus infection 07/03/2018  . Pain of right hip joint 03/18/2018  . Chronic nonallergic rhinitis 12/20/2017  . Cough, persistent 12/20/2017  . Right flank pain 12/10/2017  . Fatigue 07/12/2017  . Morbid obesity ([address]) 03/05/2017  . Chronic diastolic heart  failure ([place]) 11/29/2016  . Dyspnea on exertion 10/06/2016  . Encounter for Medicare annual wellness exam 05/22/2016  . Hyperglycemia 08/07/2015  . Allergic 08/07/2015  . Gout 08/07/2015  . Leukocytosis 08/07/2015  . Lower abdominal pain 04/21/2015  . Low back pain 11/02/2014  . Esophageal reflux 10/28/2014  . Essential hypertension, benign 10/28/2014  . Mixed hyperlipidemia 10/28/2014  . Benign essential tremor 10/28/2014  . Bilateral dry eyes   . Moderate persistent asthma   . Arthritis   . Cancer ([place])   . History of chicken pox     Percival Spanish, PT, MPT 05/08/2019, 2:06 PM  [facility] [address]  [place], [place], [address] Phone: [phone]   Fax:  [phone]  Name: Amanda Copeland MRN: [id number] Date of Birth: 01-May-1940

## 2019-05-10 ENCOUNTER — Other Ambulatory Visit: Payer: Self-pay | Admitting: Interventional Cardiology

## 2019-05-13 ENCOUNTER — Telehealth: Payer: Self-pay | Admitting: Allergy and Immunology

## 2019-05-13 NOTE — Telephone Encounter (Signed)
Yes, please refer to pulmonology. Refer to anyone except Dr. Melvyn Novas. Thanks.

## 2019-05-13 NOTE — Telephone Encounter (Signed)
Dr. Verlin Fester is it okay to refer to pulmonology?

## 2019-05-13 NOTE — Telephone Encounter (Signed)
Patient states she was in office and was told by Dr. Verlin Fester that if she was not getting better that he would refer her to a pulmonologist and she would like that to occur.  She states she wants a Dr. In [facility] and she does NOT want to see Dr. Christinia Gully.  Please call patient to discuss refferal

## 2019-05-15 ENCOUNTER — Ambulatory Visit: Payer: Medicare Other | Attending: Orthopaedic Surgery

## 2019-05-15 ENCOUNTER — Other Ambulatory Visit: Payer: Self-pay

## 2019-05-15 VITALS — BP 120/78 | HR 65

## 2019-05-15 DIAGNOSIS — R2681 Unsteadiness on feet: Secondary | ICD-10-CM | POA: Insufficient documentation

## 2019-05-15 DIAGNOSIS — G8929 Other chronic pain: Secondary | ICD-10-CM | POA: Insufficient documentation

## 2019-05-15 DIAGNOSIS — R262 Difficulty in walking, not elsewhere classified: Secondary | ICD-10-CM | POA: Diagnosis not present

## 2019-05-15 DIAGNOSIS — M6281 Muscle weakness (generalized): Secondary | ICD-10-CM | POA: Insufficient documentation

## 2019-05-15 DIAGNOSIS — R293 Abnormal posture: Secondary | ICD-10-CM | POA: Diagnosis not present

## 2019-05-15 DIAGNOSIS — M5440 Lumbago with sciatica, unspecified side: Secondary | ICD-10-CM | POA: Diagnosis not present

## 2019-05-15 NOTE — Therapy (Addendum)
[facility] [address]  [place], [place], [address] Phone: [phone]   Fax:  [phone]  Physical Therapy Treatment  Patient Details  Name: Amanda Copeland MRN: [id number] Date of Birth: 10-Mar-1940 Referring Provider (PT): Joni Fears, MD   Encounter Date: 05/15/2019  PT End of Session - 05/15/19 1113    Visit Number  3    Number of Visits  4    Date for PT Re-Evaluation  05/29/19    Authorization Type  Medicare & BCBS    PT Start Time  1103    PT Stop Time  1146    PT Time Calculation (min)  43 min    Activity Tolerance  Patient tolerated treatment well;Treatment limited secondary to medical complications (Comment);Patient limited by fatigue   SOB/DOE   Behavior During Therapy  [facility] for tasks assessed/performed       Past Medical History:  Diagnosis Date  . Arthritis   . Asthma    childhood now returning  . Back pain affecting pregnancy 11/02/2014  . Benign essential tremor 10/28/2014  . Bilateral dry eyes   . Breast cancer ([place])    right  . Cancer (HCC)    Breast  . Dry eyes   . Encounter for Medicare annual wellness exam 05/22/2016  . Essential hypertension, benign 10/28/2014  . H/O measles   . H/O mumps   . History of chicken pox   . Lumbago 11/02/2014  . Mixed hyperlipidemia 10/28/2014  . Obesity 10/28/2014  . Stenosis of cervical spine     Past Surgical History:  Procedure Laterality Date  . BACK SURGERY    . BREAST REDUCTION SURGERY Left   . CHOLECYSTECTOMY    . EYE SURGERY  2008   b/l cataracts removed, in [place]  . MASTECTOMY Right   . REDUCTION MAMMAPLASTY    . REPLACEMENT TOTAL KNEE BILATERAL Bilateral   . ROTATOR CUFF REPAIR Left     Vitals:   05/15/19 1111  BP: 120/78  Pulse: 65  SpO2: 95%    Subjective Assessment - 05/15/19 1111    Subjective  Pt. reporting SOB after walking into clinic and requesting not to perform NuStep warmpu.    Pertinent History  hx of surgery  for "ruptured disc", hx of breast cancerChronic LBP with h/o surgery for "ruptured disc", spinal stenosis, OA, B TKA, L RTC repair, chronic diastolic HF, HLD, moderate asthma, DOE, HTN, breast cancer, benign essential tremor, morbid obesity    Patient Stated Goals  "exercise program for my back"    Currently in Pain?  Yes    Pain Score  0-No pain   LBP up to 5/10 at most   Pain Location  Back    Pain Orientation  Lower    Pain Type  Chronic pain    Pain Frequency  Intermittent    Multiple Pain Sites  No                       [facility] Adult PT Treatment/Exercise - 05/15/19 0001      Self-Care   Self-Care  Other Self-Care Comments    Other Self-Care Comments   Discussed comprehensive HEP to check for tolerance and understanding; pt. feels that the sidelying clam shell with yelllow TB resistance has irritated her back;       Lumbar Exercises: Stretches   Lower Trunk Rotation Limitations  10 x 5"    Piriformis Stretch  Right;Left;1 rep;30 seconds    Piriformis Stretch Limitations  seated modified piriformis stretch with LE resting on table       Lumbar Exercises: Supine   Clam  5 reps;3 seconds   reduced reps to check technique    Clam Limitations  alt hip ABD/ER with yellow TB    Bent Knee Raise  3 seconds;5 reps   decreased reps to check tolerance and technique    Bent Knee Raise Limitations  brace marching with yellow TB    Bridge  10 reps;3 seconds    Bridge Limitations  + yellow TB hip ABD isometric       Knee/Hip Exercises: Seated   Other Seated Knee/Hip Exercises  saeated B hip IR with ball squeeze btw knees and yellow TB at ankles x 10 resp       Knee/Hip Exercises: Sidelying   Clams  x 10 reps each LE   no resistance as pt. feels yellow TB irritated LBP with HEP            PT Education - 05/15/19 1211    Education Details  HEP update; seated B clam shell with yellow TB, B hip IR with yellow TB at ankle    Person(s) Educated  Patient    Methods   Explanation;Demonstration;Verbal cues;Handout    Comprehension  Verbalized understanding;Returned demonstration;Verbal cues required       PT Short Term Goals - 08/21/17 0935      PT SHORT TERM GOAL #1   Title  Independent with initial HEP ([place] program at supported level as inidicated)    Status  Achieved      PT SHORT TERM GOAL #2   Title  Increase overall LE strength by 1/2 - 1 grade for improved stability    Status  Achieved      PT SHORT TERM GOAL #3   Title  Increase Berg to 38/56 to reduce fall risk    Status  Achieved        PT Long Term Goals - 05/08/19 1145      PT LONG TERM GOAL #1   Title  Independent with advanced/ongoing HEP    Status  On-going    Target Date  05/29/19      PT LONG TERM GOAL #2   Title  Increase overall LE strength to >/= 4-/5 to 4/5 for improved stability and activity tolerance    Status  On-going    Target Date  05/29/19      PT LONG TERM GOAL #3   Title  Patient to report ability to perform ADLs, household and work-related tasks without increased LBP    Status  On-going    Target Date  05/29/19            Plan - 05/15/19 1113    Clinical Impression Statement  [name] reporting increased LBP after performing updated HEP and feels sidelying clam shell activity with yellow TB resistance is responsible for this pain.  Reviewed full updated HEP with minor correction with clam shell to prevent trunk rotation and performed without yellow band resistance.  Pt. was able to tolerate entire updated HEP from last session without increased LBP thus instructed pt. to continue this with exception of removing yellow TB from sidelying clam shell.  Pt. verbalized understanding.  Also added seated resisted B hip IR and seated clam shell without increased LBP thus updated HEP with these activities via handout.  Ended session with pt. reporting she was pain free.  Comorbidities  Chronic LBP with h/o surgery for "ruptured disc", spinal stenosis, OA, B TKA, L  RTC repair, chronic diastolic HF, HLD, moderate asthma, DOE, HTN, breast cancer, benign essential tremor, morbid obesity    Rehab Potential  Good    PT Treatment/Interventions  ADLs/Self Care Home Management;Cryotherapy;Electrical Stimulation;Iontophoresis 4mg /ml Dexamethasone;Moist Heat;Traction;Ultrasound;DME Instruction;Gait training;Stair training;Functional mobility training;Therapeutic activities;Therapeutic exercise;Balance training;Neuromuscular re-education;Patient/family education;Manual techniques;Passive range of motion;Dry needling;Taping;Spinal Manipulations;Joint Manipulations    PT Next Visit Plan  lumbopelvic/LE strengthening - porgress as appropriate with HEP update as indicated; address any concerns re: posture & body mechanics education    PT Home Exercise Plan  05/01/19 - abd bracing/pelvic tilt + bent-knee fall-out, brace marching, hip adduction pillow squeeze; 05/08/19 - yellow TB hooklying clams, marching & hip ABD isometric bridge, sidelying clam w/ or w/o yellow TB; 05/15/19 - seated B clam shell with yellow TB, B hip IR with yellow TB at ankle    Consulted and Agree with Plan of Care  Patient       Patient will benefit from skilled therapeutic intervention in order to improve the following deficits and impairments:  Abnormal gait, Cardiopulmonary status limiting activity, Decreased activity tolerance, Decreased balance, Decreased endurance, Decreased knowledge of precautions, Decreased mobility, Decreased range of motion, Decreased strength, Difficulty walking, Increased muscle spasms, Impaired perceived functional ability, Impaired flexibility, Improper body mechanics, Postural dysfunction, Pain  Visit Diagnosis: Chronic bilateral low back pain with sciatica, sciatica laterality unspecified  Muscle weakness (generalized)  Difficulty in walking, not elsewhere classified  Abnormal posture  Unsteadiness on feet     Problem List Patient Active Problem List   Diagnosis  Date Noted  . Right shoulder pain 03/03/2019  . Primary osteoarthritis, right shoulder 02/28/2019  . Other forms of dyspnea 11/22/2018  . Educated about COVID-19 virus infection 07/03/2018  . Pain of right hip joint 03/18/2018  . Chronic nonallergic rhinitis 12/20/2017  . Cough, persistent 12/20/2017  . Right flank pain 12/10/2017  . Fatigue 07/12/2017  . Morbid obesity ([place]) 03/05/2017  . Chronic diastolic heart failure ([place]) 11/29/2016  . Dyspnea on exertion 10/06/2016  . Encounter for Medicare annual wellness exam 05/22/2016  . Hyperglycemia 08/07/2015  . Allergic 08/07/2015  . Gout 08/07/2015  . Leukocytosis 08/07/2015  . Lower abdominal pain 04/21/2015  . Low back pain 11/02/2014  . Esophageal reflux 10/28/2014  . Essential hypertension, benign 10/28/2014  . Mixed hyperlipidemia 10/28/2014  . Benign essential tremor 10/28/2014  . Bilateral dry eyes   . Moderate persistent asthma   . Arthritis   . Cancer ([place])   . History of chicken pox     Bess Harvest, [place] 05/15/19 12:12 PM   [facility] [address]  [place], [place], [address] Phone: [phone]   Fax:  [phone]  Name: Seven Challa MRN: [id number] Date of Birth: February 14, 1940

## 2019-05-16 NOTE — Telephone Encounter (Signed)
Can someone please refer patient as requested and thank you.

## 2019-05-16 NOTE — Telephone Encounter (Signed)
Elmyra Ricks can you help me out with this one.  Thank You

## 2019-05-16 NOTE — Telephone Encounter (Signed)
Yes, dyspnea. Thanks.

## 2019-05-22 ENCOUNTER — Encounter: Payer: Self-pay | Admitting: Physical Therapy

## 2019-05-22 ENCOUNTER — Other Ambulatory Visit: Payer: Self-pay

## 2019-05-22 ENCOUNTER — Ambulatory Visit: Payer: Medicare Other | Admitting: Physical Therapy

## 2019-05-22 DIAGNOSIS — M5440 Lumbago with sciatica, unspecified side: Secondary | ICD-10-CM | POA: Diagnosis not present

## 2019-05-22 DIAGNOSIS — R262 Difficulty in walking, not elsewhere classified: Secondary | ICD-10-CM

## 2019-05-22 DIAGNOSIS — R293 Abnormal posture: Secondary | ICD-10-CM | POA: Diagnosis not present

## 2019-05-22 DIAGNOSIS — R2681 Unsteadiness on feet: Secondary | ICD-10-CM | POA: Diagnosis not present

## 2019-05-22 DIAGNOSIS — G8929 Other chronic pain: Secondary | ICD-10-CM

## 2019-05-22 DIAGNOSIS — M6281 Muscle weakness (generalized): Secondary | ICD-10-CM

## 2019-05-22 NOTE — Therapy (Signed)
[facility] [address]  [place], [place], [address] Phone: [phone]   Fax:  [phone]  Physical Therapy Treatment / Discharge Summary  Patient Details  Name: Tanishia Lemaster MRN: [id number] Date of Birth: 03-31-1940 Referring Provider (PT): Joni Fears, MD   Encounter Date: 05/22/2019  PT End of Session - 05/22/19 1100    Visit Number  4    Number of Visits  4    Date for PT Re-Evaluation  05/29/19    Authorization Type  Medicare & BCBS    PT Start Time  1100    PT Stop Time  1134    PT Time Calculation (min)  34 min    Activity Tolerance  Patient tolerated treatment well;Treatment limited secondary to medical complications (Comment);Patient limited by fatigue   SOB/DOE   Behavior During Therapy  [facility] for tasks assessed/performed       Past Medical History:  Diagnosis Date  . Arthritis   . Asthma    childhood now returning  . Back pain affecting pregnancy 11/02/2014  . Benign essential tremor 10/28/2014  . Bilateral dry eyes   . Breast cancer ([place])    right  . Cancer ([facility])    Breast  . Dry eyes   . Encounter for Medicare annual wellness exam 05/22/2016  . Essential hypertension, benign 10/28/2014  . H/O measles   . H/O mumps   . History of chicken pox   . Lumbago 11/02/2014  . Mixed hyperlipidemia 10/28/2014  . Obesity 10/28/2014  . Stenosis of cervical spine     Past Surgical History:  Procedure Laterality Date  . BACK SURGERY    . BREAST REDUCTION SURGERY Left   . CHOLECYSTECTOMY    . EYE SURGERY  2008   b/l cataracts removed, in [place]  . MASTECTOMY Right   . REDUCTION MAMMAPLASTY    . REPLACEMENT TOTAL KNEE BILATERAL Bilateral   . ROTATOR CUFF REPAIR Left     There were no vitals filed for this visit.  Subjective Assessment - 05/22/19 1101    Subjective  Pt denies pain currently but reports as she wts walking sometimes her back and hip will start to hurt and this makes her legs  get wobbly so she will have to stop.    Pertinent History  hx of surgery for "ruptured disc", hx of breast cancerChronic LBP with h/o surgery for "ruptured disc", spinal stenosis, OA, B TKA, L RTC repair, chronic diastolic HF, HLD, moderate asthma, DOE, HTN, breast cancer, benign essential tremor, morbid obesity    Patient Stated Goals  "exercise program for my back"    Currently in Pain?  No/denies    Pain Score  0-No pain   briefly up 4/10 with walking but subsides with rest   Pain Location  Back    Pain Orientation  Lower;Right    Pain Descriptors / Indicators  Discomfort   "uncomfortable & weak"   Pain Type  Chronic pain    Pain Frequency  Intermittent    Aggravating Factors   walking    Pain Relieving Factors  rest         [facility] PT Assessment - 05/22/19 1100      Assessment   Medical Diagnosis  B chronic LBP    Referring Provider (PT)  Joni Fears, MD    Onset Date/Surgical Date  --   chronic with current exacerbation x ~1 yr   Next MD Visit  PRN  Strength   Right Hip Flexion  4/5    Right Hip Extension  4-/5    Right Hip External Rotation   4/5    Right Hip Internal Rotation  4+/5    Right Hip ABduction  4/5    Right Hip ADduction  4/5    Left Hip Flexion  4/5    Left Hip Extension  4-/5    Left Hip External Rotation  4/5    Left Hip Internal Rotation  4+/5    Left Hip ABduction  4/5    Left Hip ADduction  4/5    Right Knee Flexion  5/5    Right Knee Extension  4+/5    Left Knee Flexion  5/5    Left Knee Extension  4+/5    Right Ankle Dorsiflexion  4+/5    Left Ankle Dorsiflexion  4+/5                             PT Short Term Goals - 08/21/17 0935      PT SHORT TERM GOAL #1   Title  Independent with initial HEP (Bend program at supported level as inidicated)    Status  Achieved      PT SHORT TERM GOAL #2   Title  Increase overall LE strength by 1/2 - 1 grade for improved stability    Status  Achieved      PT SHORT TERM  GOAL #3   Title  Increase Berg to 38/56 to reduce fall risk    Status  Achieved        PT Long Term Goals - 05/22/19 1107      PT LONG TERM GOAL #1   Title  Independent with advanced/ongoing HEP    Status  Achieved      PT LONG TERM GOAL #2   Title  Increase overall LE strength to >/= 4-/5 to 4/5 for improved stability and activity tolerance    Status  Achieved      PT LONG TERM GOAL #3   Title  Patient to report ability to perform ADLs, household and work-related tasks without increased LBP    Status  Achieved            Plan - 05/22/19 1134    Clinical Impression Statement  [name] notes flareup of LBP which she attributes to yellow TB sidelying clam has been better since discontinuing yellow band with this exercise. She states she will still intermittently experience low back and hip pain with walking which causes her to feel unsteady at times, but pain resolves with rest and does not interfere with her ability to complete her normal daily tasks (she is mostly limited by chronic SOB/DOE). She has demonstrated improvement in her proximal LE strength with greatest weakness persisting in hip extension but feels comfortable with ongoing HEP. All goals essentially met and patient expressing desire to finish PT today, therefore will proceed with discharge from PT for this episode.    Comorbidities  Chronic LBP with h/o surgery for "ruptured disc", spinal stenosis, OA, B TKA, L RTC repair, chronic diastolic HF, HLD, moderate asthma, DOE, HTN, breast cancer, benign essential tremor, morbid obesity    Rehab Potential  Good    PT Treatment/Interventions  ADLs/Self Care Home Management;Cryotherapy;Electrical Stimulation;Iontophoresis 68m/ml Dexamethasone;Moist Heat;Traction;Ultrasound;DME Instruction;Gait training;Stair training;Functional mobility training;Therapeutic activities;Therapeutic exercise;Balance training;Neuromuscular re-education;Patient/family education;Manual techniques;Passive  range of motion;Dry needling;Taping;Spinal Manipulations;Joint Manipulations    PT Next Visit  Plan  Discharge    PT Home Exercise Plan  05/01/19 - abd bracing/pelvic tilt + bent-knee fall-out, brace marching, hip adduction pillow squeeze; 05/08/19 - yellow TB hooklying clams, marching & hip ABD isometric bridge, sidelying clam w/ or w/o yellow TB; 05/15/19 - seated B clam shell with yellow TB, B hip IR with yellow TB at ankle    Consulted and Agree with Plan of Care  Patient       Patient will benefit from skilled therapeutic intervention in order to improve the following deficits and impairments:  Abnormal gait, Cardiopulmonary status limiting activity, Decreased activity tolerance, Decreased balance, Decreased endurance, Decreased knowledge of precautions, Decreased mobility, Decreased range of motion, Decreased strength, Difficulty walking, Increased muscle spasms, Impaired perceived functional ability, Impaired flexibility, Improper body mechanics, Postural dysfunction, Pain  Visit Diagnosis: Chronic bilateral low back pain with sciatica, sciatica laterality unspecified  Muscle weakness (generalized)  Difficulty in walking, not elsewhere classified  Abnormal posture     Problem List Patient Active Problem List   Diagnosis Date Noted  . Right shoulder pain 03/03/2019  . Primary osteoarthritis, right shoulder 02/28/2019  . Other forms of dyspnea 11/22/2018  . Educated about COVID-19 virus infection 07/03/2018  . Pain of right hip joint 03/18/2018  . Chronic nonallergic rhinitis 12/20/2017  . Cough, persistent 12/20/2017  . Right flank pain 12/10/2017  . Fatigue 07/12/2017  . Morbid obesity ([place]) 03/05/2017  . Chronic diastolic heart failure ([place]) 11/29/2016  . Dyspnea on exertion 10/06/2016  . Encounter for Medicare annual wellness exam 05/22/2016  . Hyperglycemia 08/07/2015  . Allergic 08/07/2015  . Gout 08/07/2015  . Leukocytosis 08/07/2015  . Lower abdominal pain  04/21/2015  . Low back pain 11/02/2014  . Esophageal reflux 10/28/2014  . Essential hypertension, benign 10/28/2014  . Mixed hyperlipidemia 10/28/2014  . Benign essential tremor 10/28/2014  . Bilateral dry eyes   . Moderate persistent asthma   . Arthritis   . Cancer ([place])   . History of chicken pox    PHYSICAL THERAPY DISCHARGE SUMMARY  Visits from Start of Care: 4  Current functional level related to goals / functional outcomes:   Refer to above clinical impression.   Remaining deficits:    As above.   Education / Equipment:   HEP  Plan: Patient agrees to discharge.  Patient goals were met. Patient is being discharged due to meeting the stated rehab goals.  ?????      Percival Spanish, PT, MPT 05/22/2019, 1:58 PM  [facility] [address]  [place], [place], [address] Phone: [phone]   Fax:  [phone]  Name: Arissa Fagin MRN: [id number] Date of Birth: Sep 06, 1940

## 2019-05-24 ENCOUNTER — Other Ambulatory Visit: Payer: Self-pay

## 2019-05-27 ENCOUNTER — Ambulatory Visit (INDEPENDENT_AMBULATORY_CARE_PROVIDER_SITE_OTHER): Payer: Medicare Other | Admitting: Family Medicine

## 2019-05-27 ENCOUNTER — Other Ambulatory Visit: Payer: Self-pay

## 2019-05-27 VITALS — BP 113/52 | HR 62 | Temp 97.2°F | Ht 61.0 in | Wt 252.8 lb

## 2019-05-27 DIAGNOSIS — J45909 Unspecified asthma, uncomplicated: Secondary | ICD-10-CM | POA: Diagnosis not present

## 2019-05-27 DIAGNOSIS — I10 Essential (primary) hypertension: Secondary | ICD-10-CM

## 2019-05-27 DIAGNOSIS — I5032 Chronic diastolic (congestive) heart failure: Secondary | ICD-10-CM

## 2019-05-27 DIAGNOSIS — M109 Gout, unspecified: Secondary | ICD-10-CM | POA: Diagnosis not present

## 2019-05-27 DIAGNOSIS — R0609 Other forms of dyspnea: Secondary | ICD-10-CM

## 2019-05-27 DIAGNOSIS — R0600 Dyspnea, unspecified: Secondary | ICD-10-CM

## 2019-05-27 DIAGNOSIS — M5440 Lumbago with sciatica, unspecified side: Secondary | ICD-10-CM | POA: Diagnosis not present

## 2019-05-27 DIAGNOSIS — E782 Mixed hyperlipidemia: Secondary | ICD-10-CM | POA: Diagnosis not present

## 2019-05-27 DIAGNOSIS — R739 Hyperglycemia, unspecified: Secondary | ICD-10-CM | POA: Diagnosis not present

## 2019-05-27 NOTE — Assessment & Plan Note (Signed)
No recent flares 

## 2019-05-27 NOTE — Patient Instructions (Signed)
Sacroiliac Joint Dysfunction  Sacroiliac joint dysfunction is a condition that causes inflammation on one or both sides of the sacroiliac (SI) joint. The SI joint connects the lower part of the spine (sacrum) with the two upper portions of the pelvis (ilium). This condition causes deep aching or burning pain in the low back. In some cases, the pain may also spread into one or both buttocks, hips, or thighs. What are the causes? This condition may be caused by:  Pregnancy. During pregnancy, extra stress is put on the SI joints because the pelvis widens.  Injury, such as: ? Injuries from car accidents. ? Sports-related injuries. ? Work-related injuries.  Having one leg that is shorter than the other.  Conditions that affect the joints, such as: ? Rheumatoid arthritis. ? Gout. ? Psoriatic arthritis. ? Joint infection (septic arthritis). Sometimes, the cause of SI joint dysfunction is not known. What are the signs or symptoms? Symptoms of this condition include:  Aching or burning pain in the lower back. The pain may also spread to other areas, such as: ? Buttocks. ? Groin. ? Thighs.  Muscle spasms in or around the painful areas.  Increased pain when standing, walking, running, stair climbing, bending, or lifting. How is this diagnosed? This condition is diagnosed with a physical exam and medical history. During the exam, the health care provider may move one or both of your legs to different positions to check for pain. Various tests may be done to confirm the diagnosis, including:  Imaging tests to look for other causes of pain. These may include: ? MRI. ? CT scan. ? Bone scan.  Diagnostic injection. A numbing medicine is injected into the SI joint using a needle. If your pain is temporarily improved or stopped after the injection, this can indicate that SI joint dysfunction is the problem. How is this treated? Treatment depends on the cause and severity of your condition.  Treatment options may include:  Ice or heat applied to the lower back area after an injury. This may help reduce pain and muscle spasms.  Medicines to relieve pain or inflammation or to relax the muscles.  Wearing a back brace (sacroiliac brace) to help support the joint while your back is healing.  Physical therapy to increase muscle strength around the joint and flexibility at the joint. This may also involve learning proper body positions and ways of moving to relieve stress on the joint.  Direct manipulation of the SI joint.  Injections of steroid medicine into the joint to reduce pain and swelling.  Radiofrequency ablation to burn away nerves that are carrying pain messages from the joint.  Use of a device that provides electrical stimulation to help reduce pain at the joint.  Surgery to put in screws and plates that limit or prevent joint motion. This is rare. Follow these instructions at home: Medicines  Take over-the-counter and prescription medicines only as told by your health care provider.  Do not drive or use heavy machinery while taking prescription pain medicine.  If you are taking prescription pain medicine, take actions to prevent or treat constipation. Your health care provider may recommend that you: ? Drink enough fluid to keep your urine pale yellow. ? Eat foods that are high in fiber, such as fresh fruits and vegetables, whole grains, and beans. ? Limit foods that are high in fat and processed sugars, such as fried or sweet foods. ? Take an over-the-counter or prescription medicine for constipation. If you have a brace:    Wear the brace as told by your health care provider. Remove it only as told by your health care provider.  Keep the brace clean.  If the brace is not waterproof: ? Do not let it get wet. ? Cover it with a watertight covering when you take a bath or a shower. Managing pain, stiffness, and swelling      Icing can help with pain and  swelling. Heat may help with muscle tension or spasms. Ask your health care provider if you should use ice or heat.  If directed, put ice on the affected area: ? If you have a removable brace, remove it as told by your health care provider. ? Put ice in a plastic bag. ? Place a towel between your skin and the bag. ? Leave the ice on for 20 minutes, 2-3 times a day.  If directed, apply heat to the affected area. Use the heat source that your health care provider recommends, such as a moist heat pack or a heating pad. ? Place a towel between your skin and the heat source. ? Leave the heat on for 20-30 minutes. ? Remove the heat if your skin turns bright red. This is especially important if you are unable to feel pain, heat, or cold. You may have a greater risk of getting burned. General instructions  Rest as needed. Ask your health care provider what activities are safe for you.  Return to your normal activities as told by your health care provider.  Exercise as directed by your health care provider or physical therapist.  Do not use any products that contain nicotine or tobacco, such as cigarettes and e-cigarettes. These can delay bone healing. If you need help quitting, ask your health care provider.  Keep all follow-up visits as told by your health care provider. This is important. Contact a health care provider if:  Your pain is not controlled with medicine.  You have a fever.  Your pain is getting worse. Get help right away if:  You have weakness, numbness, or tingling in your legs or feet.  You lose control of your bladder or bowel. Summary  Sacroiliac joint dysfunction is a condition that causes inflammation on one or both sides of the sacroiliac (SI) joint.  This condition causes deep aching or burning pain in the low back. In some cases, the pain may also spread into one or both buttocks, hips, or thighs.  Treatment depends on the cause and severity of your condition.  It may include medicines to reduce pain and swelling or to relax muscles. This information is not intended to replace advice given to you by your health care provider. Make sure you discuss any questions you have with your health care provider. Document Revised: 09/20/2017 Document Reviewed: 03/06/2017 Elsevier Patient Education  2020 [employer]  

## 2019-05-27 NOTE — Assessment & Plan Note (Signed)
She has had MRI with Dr Durward Fortes which showed progressive degenerative changes including stenosis and arthritis

## 2019-05-27 NOTE — Progress Notes (Signed)
Subjective:    Patient ID: Amanda Copeland, female    DOB: [birth year], 79 y.o.   MRN: [id number]  Chief Complaint  Patient presents with  . 3 month follow up    HPI Patient is in today for follow up on chronic medical concerns. She feels well today. No recent febrile illness or hospitalizations. No polyuria or polydipsia. She has had her COVID vaccinations and tolerated them well. No recent acute concerns. She is looking forward to cruising again. Denies CP/palp/SOB/HA/congestion/fevers/GI or GU c/o. Taking meds as prescribed  Past Medical History:  Diagnosis Date  . Arthritis   . Asthma    childhood now returning  . Back pain affecting pregnancy 11/02/2014  . Benign essential tremor 10/28/2014  . Bilateral dry eyes   . Breast cancer ([place])    right  . Cancer ([facility])    Breast  . Dry eyes   . Encounter for Medicare annual wellness exam 05/22/2016  . Essential hypertension, benign 10/28/2014  . H/O measles   . H/O mumps   . History of chicken pox   . Lumbago 11/02/2014  . Mixed hyperlipidemia 10/28/2014  . Obesity 10/28/2014  . Stenosis of cervical spine     Past Surgical History:  Procedure Laterality Date  . BACK SURGERY    . BREAST REDUCTION SURGERY Left   . CHOLECYSTECTOMY    . EYE SURGERY  2008   b/l cataracts removed, in [place]  . MASTECTOMY Right   . REDUCTION MAMMAPLASTY    . REPLACEMENT TOTAL KNEE BILATERAL Bilateral   . ROTATOR CUFF REPAIR Left     Family History  Problem Relation Age of Onset  . Heart disease Mother   . Heart attack Mother   . Heart failure Mother   . Hypertension Mother   . Cancer Father        colon  . Asthma Father   . Parkinson's disease Father   . Cancer Maternal Grandmother        uterine  . Heart disease Maternal Grandfather   . Obesity Daughter   . Appendicitis Paternal Grandmother   . Stroke Neg Hx     Social History   Socioeconomic History  . Marital status: Married    Spouse name: Not on file  . Number of children:  1  . Years of education: Not on file  . Highest education level: Master's degree (e.g., MA, MS, MEng, MEd, MSW, MBA)  Occupational History  . 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 Smoking status: Never Smoker  . Smokeless tobacco: Never Used  Substance and Sexual Activity  . Alcohol use: Yes    Alcohol/week: 0.0 standard drinks    Comment: two times a month  . Drug use: No  . Sexual activity: Not on file    Comment: lives with husband, moved [place], no dietary restrictions, retired Education officer, museum  Other Topics Concern  . Not on file  Social History Narrative   Pt lives with Spouse at home 1 daughter   Drinks coffee, and soda   Right handed   Social Determinants of Health   Financial Resource Strain:   . Difficulty of Paying Living Expenses:   Food Insecurity:   . Worried About Charity fundraiser in the Last Year:   . Arboriculturist in the Last Year:   Transportation Needs:   . Film/video editor (Medical):   Marland Kitchen Lack of Transportation (Non-Medical):  Physical Activity:   . Days of Exercise per Week:   . Minutes of Exercise per Session:   Stress:   . Feeling of Stress :   Social Connections:   . Frequency of Communication with Friends and Family:   . Frequency of Social Gatherings with Friends and Family:   . Attends Religious Services:   . Active Member of Clubs or Organizations:   . Attends Archivist Meetings:   Marland Kitchen Marital Status:   Intimate Partner Violence:   . Fear of Current or Ex-Partner:   . Emotionally Abused:   Marland Kitchen Physically Abused:   . Sexually Abused:     Outpatient Medications Prior to Visit  Medication Sig Dispense Refill  . albuterol (PROVENTIL HFA;VENTOLIN HFA) 108 (90 Base) MCG/ACT inhaler INHALE 2 PUFFS INTO THE LUNGS EVERY 4 (FOUR) HOURS AS NEEDED FOR WHEEZING OR SHORTNESS OF BREATH. 6.7 Inhaler 1  . allopurinol (ZYLOPRIM) 100 MG tablet TAKE 2 TABLETS BY MOUTH EVERY DAY 180 tablet 0  . Ascorbic Acid  (VITAMIN C PO) Take by mouth.    Marland Kitchen aspirin 81 MG tablet Take 81 mg by mouth daily.    . Azelastine-Fluticasone 137-50 MCG/ACT SUSP 1 SPRAY PER NOSTRIL TWICE DAILY FOR RUNNY NOSE 23 g 0  . Budeson-Glycopyrrol-Formoterol (BREZTRI AEROSPHERE) 160-9-4.8 MCG/ACT AERO Inhale 2 puffs into the lungs 2 (two) times daily. 10.7 g 5  . fluticasone (FLONASE) 50 MCG/ACT nasal spray USE 2 SPRAYS IN EACH NOSTRIL AT BEDTIME  11  . fluticasone (FLOVENT HFA) 110 MCG/ACT inhaler 2 PUFFS TWICE DAILY TO PREVENT COUGHING OR WHEEZING. 36 g 3  . furosemide (LASIX) 40 MG tablet TAKE 1 TABLET BY MOUTH EVERY DAY 90 tablet 2  . KLOR-CON M20 20 MEQ tablet TAKE 1 TABLET BY MOUTH EVERY DAY 30 tablet 11  . levothyroxine (SYNTHROID) 112 MCG tablet TAKE 1 TABLET (112 MCG TOTAL) BY MOUTH DAILY BEFORE BREAKFAST. 90 tablet 1  . montelukast (SINGULAIR) 10 MG tablet TAKE 1 TABLET ONCE AT BEDTIME FOR COUGHING OR WHEEZING. 30 tablet 5  . Multiple Vitamins-Minerals (ICAPS AREDS 2) CAPS Take 1 capsule by mouth 2 (two) times daily.    . Multiple Vitamins-Minerals (ZINC PO) Take by mouth.    Marland Kitchen PREVACID 30 MG capsule TAKE 1 CAPSULE BY MOUTH TWICE A DAY --MAX LIMIT 60 capsule 5  . propranolol (INDERAL) 20 MG tablet Take 1 tablet (20 mg total) by mouth 2 (two) times daily. 180 tablet 3  . Red Yeast Rice Extract (RED YEAST RICE PO) Take by mouth daily.    [name] ER 180 MG 24 hr capsule TAKE 1 CAPSULE BY MOUTH EVERY DAY 90 capsule 1  . Azelastine-Fluticasone (DYMISTA) 137-50 MCG/ACT SUSP 1-2 sprays per nostril twice daily for runny nose 23 g 5  . azithromycin (ZITHROMAX) 250 MG tablet Take 2 tabs the first day and then 1 tab daily until you run out. 6 tablet 0  . methylPREDNISolone (MEDROL DOSEPAK) 4 MG TBPK tablet Follow instructions on package. 21 tablet 0   No facility-administered medications prior to visit.    Allergies  Allergen Reactions  . Amoxicillin Rash  . Ampicillin Rash  . Penicillins Rash  . Statins Other (See Comments)      Per reports muscle aches and joint pain Per reports muscle aches and joint pain    ROS     Objective:    Physical Exam  BP (!) 113/52 (BP Location: Left Wrist, Cuff Size: Normal)   Pulse 62  Temp (!) 97.2 F (36.2 C) (Temporal)   Ht 5\' 1"  (1.549 m)   Wt 252 lb 12.8 oz (114.7 kg)   SpO2 97%   BMI 47.77 kg/m  Wt Readings from Last 3 Encounters:  05/27/19 252 lb 12.8 oz (114.7 kg)  04/09/19 251 lb (113.9 kg)  02/28/19 251 lb (113.9 kg)    Diabetic Foot Exam - Simple   No data filed     Lab Results  Component Value Date   WBC 7.8 02/26/2019   HGB 13.4 02/26/2019   HCT 41.2 02/26/2019   PLT 327.0 02/26/2019   GLUCOSE 108 (H) 03/04/2019   CHOL 217 (H) 02/26/2019   TRIG 152.0 (H) 02/26/2019   HDL 57.10 02/26/2019   LDLDIRECT 115.0 10/27/2016   LDLCALC 130 (H) 02/26/2019   ALT 10 02/26/2019   AST 16 02/26/2019   NA 144 03/04/2019   K 4.6 03/04/2019   CL 102 03/04/2019   CREATININE 0.88 03/04/2019   BUN 21 03/04/2019   CO2 21 03/04/2019   TSH 1.16 02/26/2019   HGBA1C 5.5 02/26/2019    Lab Results  Component Value Date   TSH 1.16 02/26/2019   Lab Results  Component Value Date   WBC 7.8 02/26/2019   HGB 13.4 02/26/2019   HCT 41.2 02/26/2019   MCV 95.7 02/26/2019   PLT 327.0 02/26/2019   Lab Results  Component Value Date   NA 144 03/04/2019   K 4.6 03/04/2019   CO2 21 03/04/2019   GLUCOSE 108 (H) 03/04/2019   BUN 21 03/04/2019   CREATININE 0.88 03/04/2019   BILITOT 0.4 02/26/2019   ALKPHOS 111 02/26/2019   AST 16 02/26/2019   ALT 10 02/26/2019   PROT 6.6 02/26/2019   ALBUMIN 4.0 02/26/2019   CALCIUM 9.3 03/04/2019   GFR 67.21 02/26/2019   Lab Results  Component Value Date   CHOL 217 (H) 02/26/2019   Lab Results  Component Value Date   HDL 57.10 02/26/2019   Lab Results  Component Value Date   LDLCALC 130 (H) 02/26/2019   Lab Results  Component Value Date   TRIG 152.0 (H) 02/26/2019   Lab Results  Component Value Date    CHOLHDL 4 02/26/2019   Lab Results  Component Value Date   HGBA1C 5.5 02/26/2019       Assessment & Plan:   Problem List Items Addressed This Visit    Essential hypertension, benign    Well controlled, no changes to meds. Encouraged heart healthy diet such as the DASH diet and exercise as tolerated.       Mixed hyperlipidemia    Encouraged heart healthy diet, increase exercise, avoid trans fats, consider a krill oil cap daily      Low back pain    She has had MRI with Dr Durward Fortes which showed progressive degenerative changes including stenosis and arthritis      Hyperglycemia    hgba1c acceptable, minimize simple carbs. Increase exercise as tolerated      Gout    No recent flares      Asthma    No recent flare but is struggling with SOB and would like a new pulmonology consultation to make sure we are not missing anything      Chronic diastolic heart failure ([place])    No recent exacerbation but continues to have trouble with SOB but cardiology did a work up but it did not show any acute cardiac concern. Largely deconditioning.  I have discontinued Shalunda Tse "[name]"'s azithromycin and methylPREDNISolone. I am also having her maintain her Red Yeast Rice Extract (RED YEAST RICE PO), aspirin, ICaps Areds 2, fluticasone, Prevacid, albuterol, Azelastine-Fluticasone, propranolol, Ascorbic Acid (VITAMIN C PO), Multiple Vitamins-Minerals (ZINC PO), montelukast, Flovent HFA, Klor-Con M20, allopurinol, Tiadylt ER, levothyroxine, Breztri Aerosphere, and furosemide.  No orders of the defined types were placed in this encounter.    [name], MD

## 2019-05-27 NOTE — Assessment & Plan Note (Signed)
No recent flare but is struggling with SOB and would like a new pulmonology consultation to make sure we are not missing anything

## 2019-05-27 NOTE — Assessment & Plan Note (Signed)
No recent exacerbation but continues to have trouble with SOB but cardiology did a work up but it did not show any acute cardiac concern. Largely deconditioning.

## 2019-05-27 NOTE — Assessment & Plan Note (Signed)
hgba1c acceptable, minimize simple carbs. Increase exercise as tolerated.  

## 2019-05-27 NOTE — Assessment & Plan Note (Signed)
Well controlled, no changes to meds. Encouraged heart healthy diet such as the DASH diet and exercise as tolerated.  °

## 2019-05-27 NOTE — Assessment & Plan Note (Signed)
Encouraged heart healthy diet, increase exercise, avoid trans fats, consider a krill oil cap daily 

## 2019-05-29 ENCOUNTER — Encounter: Payer: Medicare Other | Admitting: Physical Therapy

## 2019-05-29 ENCOUNTER — Telehealth: Payer: Self-pay | Admitting: *Deleted

## 2019-05-29 NOTE — Telephone Encounter (Signed)
Do you know the answer to this?  Not sure if you guys discussed this.

## 2019-05-29 NOTE — Telephone Encounter (Signed)
Amanda Copeland D, CMA  Does pt want to stay for [facility] but change provider? or change to a different office completely?

## 2019-05-29 NOTE — Telephone Encounter (Signed)
We did she just would like a new provider.

## 2019-05-30 NOTE — Telephone Encounter (Signed)
TY - pt is scheduled with Dr. Halford Chessman 6/1

## 2019-06-05 ENCOUNTER — Other Ambulatory Visit: Payer: Self-pay

## 2019-06-05 ENCOUNTER — Ambulatory Visit (INDEPENDENT_AMBULATORY_CARE_PROVIDER_SITE_OTHER): Payer: Medicare Other | Admitting: Allergy and Immunology

## 2019-06-05 ENCOUNTER — Encounter: Payer: Self-pay | Admitting: Allergy and Immunology

## 2019-06-05 VITALS — BP 128/72 | HR 60 | Temp 98.4°F | Resp 16

## 2019-06-05 DIAGNOSIS — J310 Chronic rhinitis: Secondary | ICD-10-CM

## 2019-06-05 DIAGNOSIS — J4540 Moderate persistent asthma, uncomplicated: Secondary | ICD-10-CM | POA: Diagnosis not present

## 2019-06-05 DIAGNOSIS — R0609 Other forms of dyspnea: Secondary | ICD-10-CM

## 2019-06-05 NOTE — Progress Notes (Signed)
Follow-up Note  RE: Amanda Copeland MRN: [id number] DOB: [birth year] Date of Office Visit: 06/05/2019  Primary care provider: [name], MD Referring provider: [name], MD  History of present illness: Amanda Copeland is a 79 y.o. female with a history of persistent asthma, nonallergic rhinitis, and esophageal reflux presenting today for follow-up.  She was last seen in this clinic on April 24, 2019.  She was prescribed BrezTri 160 mcg, 2 inhalations twice daily on her last visit.  She reports that initially she noticed some benefit from this medication, however after a week or so she "could hardly breathe."  She discontinued the inhaler for approximately 1 week with a return to baseline and her breathing.  Then she restarted the [facility] and began to experience increased dyspnea.  Therefore, she discontinued this medication 2 weeks ago and her breathing has returned to baseline.  However, at baseline she is still experiencing significant dyspnea on exertion.  She reports that she will become short of breath just walking across her kitchen.  She has not been using albuterol over the past couple weeks while off of BrezTri. She reports an unremarkable EKG and coronary calcium CT in December 2020.  She has no nasal or sinus symptom complaints today.  Assessment and plan: Other forms of dyspnea Spirometry today revealed FEV1 was 84% predicted without significant postbronchodilator improvement.  She has not had significant reversibility during the past 3 office visits despite performing spirometry during symptomatic periods.  The patient is to seek further evaluation and treatment with a pulmonologist and cardiologist.  For now, use albuterol HFA, 1 to 2 inhalations 4 to 6 hours if needed.  The patient is to monitor/assess for symptom response.  Chronic nonallergic rhinitis  Continue azelastine/fluticasone nasal spray, 1 spray per nostril twice daily if needed.  Nasal saline spray (i.e.,  Simply Saline) or nasal saline lavage (i.e., NeilMed) is recommended as needed and prior to medicated nasal sprays.  For thick post nasal drainage, add guaifenesin [phone] mg (Mucinex)  twice daily as needed with adequate hydration as discussed.   Diagnostics: Spirometry reveals an FVC of 1.73 L (75% predicted) and an FEV1 1.43 L (84% predicted) without significant postbronchodilator improvement.  Please see scanned spirometry results for details.    Physical examination: Blood pressure 128/72, pulse 60, temperature 98.4 F (36.9 C), temperature source Oral, resp. rate 16, SpO2 97 %.  General: Alert, interactive, in no acute distress. HEENT: TMs pearly gray, turbinates mildly edematous without discharge, post-pharynx unremarkable. Neck: Supple without lymphadenopathy. Lungs: Clear to auscultation without wheezing, rhonchi or rales. CV: Normal S1, S2 without murmurs. Skin: Warm and dry, without lesions or rashes.  The following portions of the patient's history were reviewed and updated as appropriate: allergies, current medications, past family history, past medical history, past social history, past surgical history and problem list.  Current Outpatient Medications  Medication Sig Dispense Refill  . albuterol (PROVENTIL HFA;VENTOLIN HFA) 108 (90 Base) MCG/ACT inhaler INHALE 2 PUFFS INTO THE LUNGS EVERY 4 (FOUR) HOURS AS NEEDED FOR WHEEZING OR SHORTNESS OF BREATH. 6.7 Inhaler 1  . allopurinol (ZYLOPRIM) 100 MG tablet TAKE 2 TABLETS BY MOUTH EVERY DAY 180 tablet 0  . Ascorbic Acid (VITAMIN C PO) Take by mouth.    Marland Kitchen aspirin 81 MG tablet Take 81 mg by mouth daily.    . Azelastine-Fluticasone 137-50 MCG/ACT SUSP 1 SPRAY PER NOSTRIL TWICE DAILY FOR RUNNY NOSE 23 g 0  . furosemide (LASIX) 40 MG tablet TAKE  1 TABLET BY MOUTH EVERY DAY 90 tablet 2  . KLOR-CON M20 20 MEQ tablet TAKE 1 TABLET BY MOUTH EVERY DAY 30 tablet 11  . levothyroxine (SYNTHROID) 112 MCG tablet TAKE 1 TABLET (112 MCG  TOTAL) BY MOUTH DAILY BEFORE BREAKFAST. 90 tablet 1  . montelukast (SINGULAIR) 10 MG tablet TAKE 1 TABLET ONCE AT BEDTIME FOR COUGHING OR WHEEZING. 30 tablet 5  . Multiple Vitamins-Minerals (ICAPS AREDS 2) CAPS Take 1 capsule by mouth 2 (two) times daily.    . Multiple Vitamins-Minerals (ZINC PO) Take by mouth.    Marland Kitchen PREVACID 30 MG capsule TAKE 1 CAPSULE BY MOUTH TWICE A DAY --MAX LIMIT 60 capsule 5  . propranolol (INDERAL) 20 MG tablet Take 1 tablet (20 mg total) by mouth 2 (two) times daily. 180 tablet 3  . Red Yeast Rice Extract (RED YEAST RICE PO) Take by mouth daily.    [name] ER 180 MG 24 hr capsule TAKE 1 CAPSULE BY MOUTH EVERY DAY 90 capsule 1  . Budeson-Glycopyrrol-Formoterol (BREZTRI AEROSPHERE) 160-9-4.8 MCG/ACT AERO Inhale 2 puffs into the lungs 2 (two) times daily. (Patient not taking: Reported on 06/05/2019) 10.7 g 5   No current facility-administered medications for this visit.    Allergies  Allergen Reactions  . Amoxicillin Rash  . Ampicillin Rash  . Penicillins Rash  . Statins Other (See Comments)    Per reports muscle aches and joint pain Per reports muscle aches and joint pain    I appreciate the opportunity to take part in Amanda Copeland's care. Please do not hesitate to contact me with questions.  Sincerely,   R. Edgar Frisk, MD

## 2019-06-05 NOTE — Patient Instructions (Signed)
Other forms of dyspnea Spirometry today revealed FEV1 was 84% predicted without significant postbronchodilator improvement.  She has not had significant reversibility over the past 3 spirometric studies despite performing the studies during symptomatic periods.  The patient is to seek further evaluation and treatment with her pulmonologist, Dr. Melvyn Novas.  I have also recommended evaluation by cardiologist.  For now, use albuterol HFA, 1 to 2 inhalations 4 to 6 hours if needed.  The patient is to monitor/assess for symptom response.  Chronic nonallergic rhinitis  Continue azelastine/fluticasone nasal spray, 1 spray per nostril twice daily if needed.  Nasal saline spray (i.e., Simply Saline) or nasal saline lavage (i.e., NeilMed) is recommended as needed and prior to medicated nasal sprays.  For thick post nasal drainage, add guaifenesin [phone] mg (Mucinex)  twice daily as needed with adequate hydration as discussed.   Follow-up with Dr. Melvyn Novas and see a cardiologist.

## 2019-06-05 NOTE — Assessment & Plan Note (Addendum)
Spirometry today revealed FEV1 was 84% predicted without significant postbronchodilator improvement.  She has not had significant reversibility during the past 3 office visits despite performing spirometry during symptomatic periods.  The patient is to seek further evaluation and treatment with a pulmonologist and cardiologist.  For now, use albuterol HFA, 1 to 2 inhalations 4 to 6 hours if needed.  The patient is to monitor/assess for symptom response.

## 2019-06-05 NOTE — Assessment & Plan Note (Signed)
   Continue azelastine/fluticasone nasal spray, 1 spray per nostril twice daily if needed.  Nasal saline spray (i.e., Simply Saline) or nasal saline lavage (i.e., NeilMed) is recommended as needed and prior to medicated nasal sprays.  For thick post nasal drainage, add guaifenesin [phone] mg (Mucinex)  twice daily as needed with adequate hydration as discussed.

## 2019-06-06 ENCOUNTER — Telehealth: Payer: Self-pay | Admitting: Interventional Cardiology

## 2019-06-06 DIAGNOSIS — R0602 Shortness of breath: Secondary | ICD-10-CM

## 2019-06-06 NOTE — Telephone Encounter (Signed)
New Message:    Pt wants [place] to give her a call please. One of her other physicians suggested that she had some test with Dr Irish Lack.

## 2019-06-06 NOTE — Telephone Encounter (Signed)
OK to order echo, BMet and BNP.  JV

## 2019-06-06 NOTE — Telephone Encounter (Signed)
Called and spoke to patient. She states that her SOB with exertion has worsened since she was seen in December. She states that she saw her PCP and they said that is was not allergies or asthma. She states that they put in a referral to pulmonary and asked that she reach back out to cardiology. Patient is seeing pulmonology on 6/1. Patient denies swelling, weight gain, or eating a lot of foods that are high in salt content. She is taking lasix 40 mg QD and k-dur 20 mEq QD. Normal creatinine and potassium in January 2021. Patient has Cardiac CT in February 2021 that showed Minimal non-obstructive CAD. Last echo was 2018 that okay at that time. Will forward to Dr. Irish Lack for review.

## 2019-06-07 ENCOUNTER — Telehealth: Payer: Self-pay

## 2019-06-07 NOTE — Telephone Encounter (Signed)
Called and spoke to patient. BMET and BNP scheduled for 5/4 and echo scheduled for 5/18.

## 2019-06-07 NOTE — Telephone Encounter (Signed)
I have sent Dr Collie Siad office a referral message to see if they can get the patient in sooner. I have copied Dr Verlin Fester in on the message I sent to their office.  Thanks

## 2019-06-07 NOTE — Telephone Encounter (Signed)
-----   Message from Felipa Emory, [place] sent at 06/05/2019  3:06 PM EDT ----- Hey Pt is scheduled with evett sood June 1st I am not sure if we scheduled that or not. Can you look into this following up with ejaz instead or do I need to do it? ----- Message ----- From: Adelina Mings, MD Sent: 06/05/2019  12:24 PM EDT To: Delene L[facility]  The patient is scheduled to see a pulmonologist in June, however needs to see a pulmonologist sooner if possible. Please check and see if the pulmonologist she is scheduled to see in June or Dr. Camillo Flaming has availability sooner. Thanks.

## 2019-06-11 ENCOUNTER — Other Ambulatory Visit: Payer: Medicare Other

## 2019-06-11 ENCOUNTER — Other Ambulatory Visit: Payer: Self-pay

## 2019-06-11 DIAGNOSIS — R0602 Shortness of breath: Secondary | ICD-10-CM | POA: Diagnosis not present

## 2019-06-12 LAB — BASIC METABOLIC PANEL
BUN/Creatinine Ratio: 13 (ref 12–28)
BUN: 10 mg/dL (ref 8–27)
CO2: 19 mmol/L — ABNORMAL LOW (ref 20–29)
Calcium: 9.1 mg/dL (ref 8.7–10.3)
Chloride: 107 mmol/L — ABNORMAL HIGH (ref 96–106)
Creatinine, Ser: 0.8 mg/dL (ref 0.57–1.00)
GFR calc Af Amer: 81 mL/min/{1.73_m2} (ref >59)
GFR calc non Af Amer: 70 mL/min/{1.73_m2} (ref >59)
Glucose: 113 mg/dL — ABNORMAL HIGH (ref 65–99)
Potassium: 4.2 mmol/L (ref 3.5–5.2)
Sodium: 145 mmol/L — ABNORMAL HIGH (ref 134–144)

## 2019-06-12 LAB — PRO B NATRIURETIC PEPTIDE: NT-Pro BNP: 636 pg/mL (ref 0–738)

## 2019-06-13 ENCOUNTER — Other Ambulatory Visit: Payer: Self-pay

## 2019-06-13 DIAGNOSIS — R0602 Shortness of breath: Secondary | ICD-10-CM

## 2019-06-13 NOTE — Telephone Encounter (Signed)
Patient is returning your call.  

## 2019-06-13 NOTE — Telephone Encounter (Signed)
Drue Novel I, RN  06/13/2019 1:37 PM EDT    The patient has been notified of the result and verbalized understanding. Patient will keep echocardiogram appt on 5/18 and will keep f/u with pulmonary. Patient has not had a recent CXR. She requests for CXR to be done at [place]. Order placed. All questions (if any) were answered. Cleon Gustin, RN 06/13/2019 1:33 PM    Drue Novel I, RN  06/13/2019 12:56 PM EDT    Left message for patient to call back.   Jettie Booze, MD  06/12/2019 6:02 PM EDT    BNP still in normal range. No cardiac source of dyspnea identified. Would send for chest xray if this has not been done recently, and f/u with pulmonary.    Drue Novel I, RN

## 2019-06-14 ENCOUNTER — Other Ambulatory Visit: Payer: Self-pay

## 2019-06-14 ENCOUNTER — Ambulatory Visit (HOSPITAL_BASED_OUTPATIENT_CLINIC_OR_DEPARTMENT_OTHER)
Admission: RE | Admit: 2019-06-14 | Discharge: 2019-06-14 | Disposition: A | Discharge status: Home / Self Care | Payer: Medicare Other | Source: Ambulatory Visit | Attending: Interventional Cardiology | Admitting: Interventional Cardiology

## 2019-06-14 DIAGNOSIS — R0602 Shortness of breath: Secondary | ICD-10-CM | POA: Insufficient documentation

## 2019-06-15 ENCOUNTER — Other Ambulatory Visit: Payer: Self-pay | Admitting: Allergy and Immunology

## 2019-06-15 DIAGNOSIS — J45991 Cough variant asthma: Secondary | ICD-10-CM

## 2019-06-20 NOTE — Telephone Encounter (Signed)
Patient is requesting to seen seen by another pulmonologist as the one she was referred to is booked out until July, she wants to know if there is anything can be done to get her in sooner

## 2019-06-25 ENCOUNTER — Ambulatory Visit (HOSPITAL_COMMUNITY): Payer: Medicare Other | Attending: Cardiovascular Disease

## 2019-06-25 ENCOUNTER — Other Ambulatory Visit: Payer: Self-pay

## 2019-06-25 DIAGNOSIS — R0602 Shortness of breath: Secondary | ICD-10-CM | POA: Diagnosis not present

## 2019-06-25 MED ORDER — PERFLUTREN LIPID MICROSPHERE
1.0000 mL | INTRAVENOUS | Status: AC | PRN
  Administered 2019-06-25: 2 mL via INTRAVENOUS

## 2019-06-26 ENCOUNTER — Telehealth: Payer: Self-pay

## 2019-06-26 NOTE — Telephone Encounter (Signed)
-----   Message from Jettie Booze, MD sent at 06/25/2019  6:35 PM EDT ----- Normal LV function.  Normal valvular function. Mild diastolic dysfunction, caused by stiffening of her heart which is appropriate for her age.

## 2019-06-26 NOTE — Telephone Encounter (Signed)
LMTCB

## 2019-06-27 ENCOUNTER — Telehealth: Payer: Self-pay | Admitting: Physical Medicine and Rehabilitation

## 2019-06-27 NOTE — Telephone Encounter (Signed)
Patient left a message requesting another injection. Right L5-S1 IL 3/18. Ok to repeat if helped, same problem/side, and no new injury?

## 2019-06-27 NOTE — Telephone Encounter (Signed)
ok 

## 2019-06-28 NOTE — Telephone Encounter (Signed)
Scheduled for 6/21 at 1030 with driver and no blood thinners.

## 2019-06-28 NOTE — Telephone Encounter (Signed)
The patient has been notified of the result and verbalized understanding.  All questions (if any) were answered. Cleon Gustin, RN 06/28/2019 2:11 PM

## 2019-07-03 ENCOUNTER — Telehealth: Payer: Self-pay | Admitting: Internal Medicine

## 2019-07-03 NOTE — Telephone Encounter (Signed)
Dr. Matilde Bash schedule is also booked through the month of June and we do not have his July schedule avail.  Pt was scheduled an appt with VS 7/13.  Please advise if we should keep the appt as scheduled due to Dr. Matilde Bash schedule being full for the month of June.

## 2019-07-04 ENCOUNTER — Other Ambulatory Visit: Payer: Self-pay | Admitting: Family Medicine

## 2019-07-09 ENCOUNTER — Ambulatory Visit: Payer: Medicare Other | Admitting: Pulmonary Disease

## 2019-07-11 NOTE — Telephone Encounter (Signed)
Notified referring office that there was no availability sooner - keeping appt where it is -pr

## 2019-07-29 ENCOUNTER — Ambulatory Visit (INDEPENDENT_AMBULATORY_CARE_PROVIDER_SITE_OTHER): Payer: Medicare Other | Admitting: Physical Medicine and Rehabilitation

## 2019-07-29 ENCOUNTER — Encounter: Payer: Self-pay | Admitting: Physical Medicine and Rehabilitation

## 2019-07-29 ENCOUNTER — Other Ambulatory Visit: Payer: Self-pay

## 2019-07-29 ENCOUNTER — Ambulatory Visit: Payer: Self-pay

## 2019-07-29 VITALS — BP 144/72 | HR 57

## 2019-07-29 DIAGNOSIS — M48062 Spinal stenosis, lumbar region with neurogenic claudication: Secondary | ICD-10-CM

## 2019-07-29 DIAGNOSIS — M5416 Radiculopathy, lumbar region: Secondary | ICD-10-CM | POA: Diagnosis not present

## 2019-07-29 MED ORDER — METHYLPREDNISOLONE ACETATE 80 MG/ML IJ SUSP
80.0000 mg | Freq: Once | INTRAMUSCULAR | Status: AC
  Administered 2019-07-29: 80 mg

## 2019-07-29 NOTE — Progress Notes (Signed)
Pt states pain across the lower back that radiates into the right buttocks and radiates into the back of the right thigh at times. Pt states pain started about several months ago. Pt states last injection 04/25/19 helped a lot and PT sessions did not help that much. Standing and walking makes pain worse. Sitting and advil helps with pain.   .Numeric Pain Rating Scale and Functional Assessment Average Pain 8   In the last MONTH (on 0-10 scale) has pain interfered with the following?  1. General activity like being  able to carry out your everyday physical activities such as walking, climbing stairs, carrying groceries, or moving a chair?  Rating(9)   +Driver, -BT, -Dye Allergies.

## 2019-07-29 NOTE — Procedures (Signed)
Lumbar Epidural Steroid Injection - Interlaminar Approach with Fluoroscopic Guidance  Patient: Amanda Copeland      Date of Birth: 05-31-1940 MRN: [id number] PCP: [name], MD      Visit Date: 07/29/2019   Universal Protocol:     Consent Given By: the patient  Position: PRONE  Additional Comments: Vital signs were monitored before and after the procedure. Patient was prepped and draped in the usual sterile fashion. The correct patient, procedure, and site was verified.   Injection Procedure Details:  Procedure Site One Meds Administered:  Meds ordered this encounter  Medications  . methylPREDNISolone acetate (DEPO-MEDROL) injection 80 mg     Laterality: Midline  Location/Site:  L5-S1  Needle size: 20 G  Needle type: Tuohy  Needle Placement: Paramedian epidural  Findings:   -Comments: Excellent flow of contrast into the epidural space.  Procedure Details: Using a paramedian approach from the side mentioned above, the region overlying the inferior lamina was localized under fluoroscopic visualization and the soft tissues overlying this structure were infiltrated with 4 ml. of 1% Lidocaine without Epinephrine. The Tuohy needle was inserted into the epidural space using a paramedian approach.   The epidural space was localized using loss of resistance along with lateral and bi-planar fluoroscopic views.  After negative aspirate for air, blood, and CSF, a 2 ml. volume of Isovue-250 was injected into the epidural space and the flow of contrast was observed. Radiographs were obtained for documentation purposes.    The injectate was administered into the level noted above.   Additional Comments:  The patient tolerated the procedure well Dressing: 2 x 2 sterile gauze and Band-Aid    Post-procedure details: Patient was observed during the procedure. Post-procedure instructions were reviewed.  Patient left the clinic in stable condition.

## 2019-07-29 NOTE — Progress Notes (Signed)
Amanda Copeland - 79 y.o. female MRN [id number]  Date of birth: 10/07/1940  Office Visit Note: Visit Date: 07/29/2019 PCP: [name], MD Referred by: [name], MD  Subjective: Chief Complaint  Patient presents with   Lower Back - Pain   Right Thigh - Pain   HPI:  Amanda Copeland is a 79 y.o. female who comes in today for planned repeat Right L5-S1 Lumbar epidural steroid injection with fluoroscopic guidance.  The patient has failed conservative care including home exercise, medications, time and activity modification.  This injection will be diagnostic and hopefully therapeutic.  Please see requesting physician notes for further details and justification. Patient received more than 50% pain relief from prior injection.   Referring: Dr. Joni Fears   ROS Otherwise per HPI.  Assessment & Plan: Visit Diagnoses:  1. Lumbar radiculopathy   2. Spinal stenosis of lumbar region with neurogenic claudication     Plan: No additional findings.   Meds & Orders:  Meds ordered this encounter  Medications   methylPREDNISolone acetate (DEPO-MEDROL) injection 80 mg    Orders Placed This Encounter  Procedures   XR C-ARM NO REPORT   Epidural Steroid injection    Follow-up: Return if symptoms worsen or fail to improve.   Procedures: No procedures performed  Lumbar Epidural Steroid Injection - Interlaminar Approach with Fluoroscopic Guidance  Patient: Amanda Copeland      Date of Birth: 1940/03/27 MRN: [id number] PCP: [name], MD      Visit Date: 07/29/2019   Universal Protocol:     Consent Given By: the patient  Position: PRONE  Additional Comments: Vital signs were monitored before and after the procedure. Patient was prepped and draped in the usual sterile fashion. The correct patient, procedure, and site was verified.   Injection Procedure Details:  Procedure Site One Meds Administered:  Meds ordered this encounter  Medications   methylPREDNISolone  acetate (DEPO-MEDROL) injection 80 mg     Laterality: Midline  Location/Site:  L5-S1  Needle size: 20 G  Needle type: Tuohy  Needle Placement: Paramedian epidural  Findings:   -Comments: Excellent flow of contrast into the epidural space.  Procedure Details: Using a paramedian approach from the side mentioned above, the region overlying the inferior lamina was localized under fluoroscopic visualization and the soft tissues overlying this structure were infiltrated with 4 ml. of 1% Lidocaine without Epinephrine. The Tuohy needle was inserted into the epidural space using a paramedian approach.   The epidural space was localized using loss of resistance along with lateral and bi-planar fluoroscopic views.  After negative aspirate for air, blood, and CSF, a 2 ml. volume of Isovue-250 was injected into the epidural space and the flow of contrast was observed. Radiographs were obtained for documentation purposes.    The injectate was administered into the level noted above.   Additional Comments:  The patient tolerated the procedure well Dressing: 2 x 2 sterile gauze and Band-Aid    Post-procedure details: Patient was observed during the procedure. Post-procedure instructions were reviewed.  Patient left the clinic in stable condition.    Clinical History: MRI LUMBAR SPINE WITHOUT CONTRAST  TECHNIQUE: Multiplanar, multisequence MR imaging of the lumbar spine was performed. No intravenous contrast was administered.  COMPARISON:  Radiography 02/28/2019.  FINDINGS: Segmentation:  5 lumbar type vertebral bodies.  Alignment: 3 mm degenerative anterolisthesis L4-5. 2 mm degenerative anterolisthesis L5-S1.  Vertebrae: Old superior endplate Schmorl's node at T11. No other focal bone finding.  Conus  medullaris and cauda equina: Conus extends to the T12-L1 level. Conus and cauda equina appear normal.  Paraspinal and other soft tissues: Negative  Disc  levels:  No significant finding at L1-2 or above.  L2-3: Mild bulging of the disc. Bilateral facet degeneration with joint effusions and mild upper trapeze. Mild narrowing of the lateral recesses but no visible neural compression  L3-4: Mild bulging of the disc. Mild facet and ligamentous hypertrophy. Mild stenosis of the lateral recesses but no visible neural compression.  L4-5: Bilateral facet arthropathy with 3 mm of anterolisthesis. Bulging of the disc. Stenosis of the canal and lateral recesses that could cause neural compression on either or both sides.  L5-S1: Facet osteoarthritis with 2 mm of anterolisthesis. No disc bulge or herniation. No compressive stenosis.  IMPRESSION: L4-5: Bilateral facet arthropathy with 3 mm of anterolisthesis. Bulging of the disc. Stenosis of the canal and lateral recesses that could cause neural compression on either or both sides. The facet arthropathy could certainly contribute to back pain or referred facet syndrome pain.  L3-4: Disc bulge. Facet hypertrophy. Mild stenosis but no visible neural compression.  L2-3: Disc bulge. Facet osteoarthritis with joint effusions. Mild stenosis but no visible neural compression. The facet arthritis could be painful.   Electronically Signed   By: Nelson Chimes M.D.   On: 04/06/2019 04:44     Objective:  VS:  HT:     WT:    BMI:      BP:(!) 144/72   HR:(!) 57bpm   TEMP: ( )   RESP:  Physical Exam Constitutional:      General: She is not in acute distress.    Appearance: Normal appearance. She is not ill-appearing.  HENT:     Head: Normocephalic and atraumatic.     Right Ear: External ear normal.     Left Ear: External ear normal.  Eyes:     Extraocular Movements: Extraocular movements intact.  Cardiovascular:     Rate and Rhythm: Normal rate.     Pulses: Normal pulses.  Musculoskeletal:     Right lower leg: No edema.     Left lower leg: No edema.     Comments: Patient has good  distal strength with no pain over the greater trochanters.  No clonus or focal weakness.  Skin:    Findings: No erythema, lesion or rash.  Neurological:     General: No focal deficit present.     Mental Status: She is alert and oriented to person, place, and time.     Sensory: No sensory deficit.     Motor: No weakness or abnormal muscle tone.     Coordination: Coordination normal.  Psychiatric:        Mood and Affect: Mood normal.        Behavior: Behavior normal.      Imaging: XR C-ARM NO REPORT  Result Date: 07/29/2019 Please see Notes tab for imaging impression.

## 2019-08-09 ENCOUNTER — Other Ambulatory Visit: Payer: Self-pay | Admitting: Family Medicine

## 2019-08-20 ENCOUNTER — Other Ambulatory Visit: Payer: Self-pay

## 2019-08-20 ENCOUNTER — Encounter: Payer: Self-pay | Admitting: Pulmonary Disease

## 2019-08-20 ENCOUNTER — Ambulatory Visit (INDEPENDENT_AMBULATORY_CARE_PROVIDER_SITE_OTHER): Payer: Medicare Other | Admitting: Pulmonary Disease

## 2019-08-20 VITALS — BP 112/62 | HR 63 | Temp 98.1°F | Ht 61.0 in | Wt 255.4 lb

## 2019-08-20 DIAGNOSIS — Z6841 Body Mass Index (BMI) 40.0 and over, adult: Secondary | ICD-10-CM | POA: Diagnosis not present

## 2019-08-20 DIAGNOSIS — R0609 Other forms of dyspnea: Secondary | ICD-10-CM

## 2019-08-20 DIAGNOSIS — R0600 Dyspnea, unspecified: Secondary | ICD-10-CM

## 2019-08-20 NOTE — Progress Notes (Signed)
[facility]  Chief Complaint  Patient presents with  . Follow-up    shortness of breath with exertion    Constitutional:  BP 112/62 (BP Location: Left Wrist, Cuff Size: Normal)   Pulse 63   Temp 98.1 F (36.7 C) (Oral)   Ht 5\' 1"  (1.549 m)   Wt 255 lb 6.4 oz (115.8 kg)   SpO2 96%   BMI 48.26 kg/m   Past Medical History:  OA, Tremor, Breast cancer Rt, HTN, HLD  Summary:  Amanda Copeland is a 79 y.o. female with dyspnea.  Subjective:  Previously seen by Dr. Melvyn Novas.  More recently seen by Dr. Verlin Fester with allergy/asthma.  She was advised to f/u with pulmonary to monitor her dyspnea.    She was tried on breztri.  This made her breathing worse.  She has tried using albuterol, but no help.   She has been seen by cardiology recently.  Echo was unremarkable, and cardiac CT did not show anything significant that would explain her breathing issues.    Chest xray from 06/14/19 was negative.  Hb from 02/26/19 was 13.4.  BMET from 06/11/19 showed creatinine 0.80.  She has occasional cough.  Nose runs with eating.  Gets wheeze in her throat.  Has started exercise in pool 3 times per week.    Physical Exam:   Appearance - well kempt  ENMT - no sinus tenderness, no nasal discharge, no oral exudate, Mallampati 3  Respiratory - no wheeze, or rales  CV - regular rate and rhythm, no murmurs  GI - soft, non tender  Lymph - no adenopathy noted in neck  Ext - no edema  Skin - no rashes  Neuro - normal strength, oriented x 3  Psych - normal mood and affect  Assessment/Plan:   Dyspnea on exertion. - she will f/u with PCP later this month to get lab testing done - recent cardiac evaluation was unrevealing - recent chest imaging studies were unrevealing - she did not notice improvement from inhaler therapy - will arrange for repeat pulmonary function test - might need to do cardiopulmonary exercise test to further evaluate - obesity and  deconditioning likely contributing; encouraged her to maintain her exercise regimen  Snoring. - advised her to monitor her sleep pattern and then determine if she should undergo a home sleep study   A total of  33 minutes spent addressing patient care issues on day of visit.  Follow up:  Patient Instructions  Will arrange for pulmonary function test  Monitor your sleep pattern at home  Follow up in 8 weeks with Dr. Halford Chessman or nurse practitioner   Signature:  Chesley Mires, MD [place] Pager: [phone] 08/20/2019, 11:49 AM  Flow Sheet     Pulmonary tests:   PFT 11/28/16 >> FEV1 1.54 (86%), FEV1% 77, TLC 4.24 (92%), DLCO 71%  Sleep tests:    Cardiac tests:   Echo 06/25/19 >> EF 60 to 65%, grade 1 DD  Medications:   Allergies as of 08/20/2019      Reactions   Amoxicillin Rash   Ampicillin Rash   Penicillins Rash   Statins Other (See Comments)   Per reports muscle aches and joint pain Per reports muscle aches and joint pain      Medication List       Accurate as of August 20, 2019 11:49 AM. If you have any questions, ask your nurse or doctor.        STOP  taking these medications   Breztri Aerosphere 160-9-4.8 MCG/ACT Aero Generic drug: Budeson-Glycopyrrol-Formoterol Stopped by: Chesley Mires, MD     TAKE these medications   albuterol 108 (90 Base) MCG/ACT inhaler Commonly known as: VENTOLIN HFA INHALE 2 PUFFS INTO THE LUNGS EVERY 4 (FOUR) HOURS AS NEEDED FOR WHEEZING OR SHORTNESS OF BREATH.   allopurinol 100 MG tablet Commonly known as: ZYLOPRIM TAKE 2 TABLETS BY MOUTH EVERY DAY   aspirin 81 MG tablet Take 81 mg by mouth daily.   Azelastine-Fluticasone 137-50 MCG/ACT Susp 1 SPRAY PER NOSTRIL TWICE DAILY FOR RUNNY NOSE   furosemide 40 MG tablet Commonly known as: LASIX TAKE 1 TABLET BY MOUTH EVERY DAY   ICaps Areds 2 Caps Take 1 capsule by mouth 2 (two) times daily.   Klor-Con M20 20 MEQ tablet Generic drug: potassium  chloride SA TAKE 1 TABLET BY MOUTH EVERY DAY   levothyroxine 112 MCG tablet Commonly known as: SYNTHROID TAKE 1 TABLET (112 MCG TOTAL) BY MOUTH DAILY BEFORE BREAKFAST.   montelukast 10 MG tablet Commonly known as: SINGULAIR TAKE 1 TABLET ONCE AT BEDTIME FOR COUGHING OR WHEEZING.   Prevacid 30 MG capsule Generic drug: lansoprazole TAKE 1 CAPSULE BY MOUTH TWICE A DAY --MAX LIMIT   propranolol 20 MG tablet Commonly known as: INDERAL Take 1 tablet (20 mg total) by mouth 2 (two) times daily.   RED YEAST RICE PO Take by mouth daily.   Tiadylt ER 180 MG 24 hr capsule Generic drug: diltiazem TAKE 1 CAPSULE BY MOUTH EVERY DAY   VITAMIN C PO Take by mouth.   ZINC PO Take by mouth.       Past Surgical History:  She  has a past surgical history that includes Mastectomy (Right); Back surgery; Rotator cuff repair (Left); Replacement total knee bilateral (Bilateral); Breast reduction surgery (Left); Cholecystectomy; Eye surgery (2008); and Reduction mammaplasty.  Family History:  Her family history includes Appendicitis in her paternal grandmother; Asthma in her father; Cancer in her father and maternal grandmother; Heart attack in her mother; Heart disease in her maternal grandfather and mother; Heart failure in her mother; Hypertension in her mother; Obesity in her daughter; Parkinson's disease in her father.  Social History:  She  reports that she has never smoked. She has never used smokeless tobacco. She reports current alcohol use. She reports that she does not use drugs.

## 2019-08-20 NOTE — Patient Instructions (Signed)
Will arrange for pulmonary function test  Monitor your sleep pattern at home  Follow up in 8 weeks with Dr. Halford Chessman or nurse practitioner

## 2019-08-27 ENCOUNTER — Ambulatory Visit (INDEPENDENT_AMBULATORY_CARE_PROVIDER_SITE_OTHER): Payer: Medicare Other | Admitting: Family Medicine

## 2019-08-27 ENCOUNTER — Other Ambulatory Visit: Payer: Self-pay

## 2019-08-27 ENCOUNTER — Encounter: Payer: Self-pay | Admitting: Family Medicine

## 2019-08-27 VITALS — BP 142/45 | HR 61 | Temp 97.7°F | Resp 12 | Ht 61.0 in | Wt 253.6 lb

## 2019-08-27 DIAGNOSIS — K219 Gastro-esophageal reflux disease without esophagitis: Secondary | ICD-10-CM | POA: Diagnosis not present

## 2019-08-27 DIAGNOSIS — R0609 Other forms of dyspnea: Secondary | ICD-10-CM

## 2019-08-27 DIAGNOSIS — J4540 Moderate persistent asthma, uncomplicated: Secondary | ICD-10-CM | POA: Diagnosis not present

## 2019-08-27 DIAGNOSIS — M25511 Pain in right shoulder: Secondary | ICD-10-CM | POA: Diagnosis not present

## 2019-08-27 DIAGNOSIS — M5440 Lumbago with sciatica, unspecified side: Secondary | ICD-10-CM

## 2019-08-27 DIAGNOSIS — R0600 Dyspnea, unspecified: Secondary | ICD-10-CM | POA: Diagnosis not present

## 2019-08-27 DIAGNOSIS — E782 Mixed hyperlipidemia: Secondary | ICD-10-CM

## 2019-08-27 DIAGNOSIS — I10 Essential (primary) hypertension: Secondary | ICD-10-CM

## 2019-08-27 DIAGNOSIS — M109 Gout, unspecified: Secondary | ICD-10-CM | POA: Diagnosis not present

## 2019-08-27 DIAGNOSIS — J45909 Unspecified asthma, uncomplicated: Secondary | ICD-10-CM | POA: Diagnosis not present

## 2019-08-27 DIAGNOSIS — R739 Hyperglycemia, unspecified: Secondary | ICD-10-CM

## 2019-08-27 LAB — CBC
HCT: 39.1 % (ref 36.0–46.0)
Hemoglobin: 13 g/dL (ref 12.0–15.0)
MCHC: 33.2 g/dL (ref 30.0–36.0)
MCV: 95.4 fl (ref 78.0–100.0)
Platelets: 349 10*3/uL (ref 150.0–400.0)
RBC: 4.1 Mil/uL (ref 3.87–5.11)
RDW: 14.6 % (ref 11.5–15.5)
WBC: 6.6 10*3/uL (ref 4.0–10.5)

## 2019-08-27 LAB — COMPREHENSIVE METABOLIC PANEL
ALT: 11 U/L (ref 0–35)
AST: 17 U/L (ref 0–37)
Albumin: 3.8 g/dL (ref 3.5–5.2)
Alkaline Phosphatase: 109 U/L (ref 39–117)
BUN: 12 mg/dL (ref 6–23)
CO2: 28 mEq/L (ref 19–32)
Calcium: 9.4 mg/dL (ref 8.4–10.5)
Chloride: 104 mEq/L (ref 96–112)
Creatinine, Ser: 0.79 mg/dL (ref 0.40–1.20)
GFR: 70.08 mL/min (ref >60.00)
Glucose, Bld: 102 mg/dL — ABNORMAL HIGH (ref 70–99)
Potassium: 4.3 mEq/L (ref 3.5–5.1)
Sodium: 142 mEq/L (ref 135–145)
Total Bilirubin: 0.4 mg/dL (ref 0.2–1.2)
Total Protein: 6.2 g/dL (ref 6.0–8.3)

## 2019-08-27 LAB — LIPID PANEL
Cholesterol: 198 mg/dL (ref 0–200)
HDL: 52.4 mg/dL (ref >39.00)
LDL Cholesterol: 115 mg/dL — ABNORMAL HIGH (ref 0–99)
NonHDL: 145.87
Total CHOL/HDL Ratio: 4
Triglycerides: 152 mg/dL — ABNORMAL HIGH (ref 0.0–149.0)
VLDL: 30.4 mg/dL (ref 0.0–40.0)

## 2019-08-27 LAB — TSH: TSH: 1.33 u[IU]/mL (ref 0.35–4.50)

## 2019-08-27 LAB — HEMOGLOBIN A1C: Hgb A1c MFr Bld: 5.7 % (ref 4.6–6.5)

## 2019-08-27 NOTE — Assessment & Plan Note (Signed)
Hydrate and monitor uric acid, no recent flares

## 2019-08-27 NOTE — Assessment & Plan Note (Signed)
hgba1c acceptable, minimize simple carbs. Increase exercise as tolerated.  

## 2019-08-27 NOTE — Assessment & Plan Note (Signed)
Responded to a steroid shot temporarily with Dr Durward Fortes. She feels she needs a new one now.

## 2019-08-27 NOTE — Assessment & Plan Note (Signed)
Well controlled, no changes to meds. Encouraged heart healthy diet such as the DASH diet and exercise as tolerated.  °

## 2019-08-27 NOTE — Assessment & Plan Note (Signed)
Following Dr Halford Chessman with pulmonology has some breathing tests set up next week

## 2019-08-27 NOTE — Assessment & Plan Note (Signed)
Deconditioning likley multifactorial

## 2019-08-27 NOTE — Patient Instructions (Signed)
Shortness of Breath, Adult Shortness of breath is when a person has trouble breathing enough air or when a person feels like she or he is having trouble breathing in enough air. Shortness of breath could be a sign of a medical problem. Follow these instructions at home:   Pay attention to any changes in your symptoms.  Do not use any products that contain nicotine or tobacco, such as cigarettes, e-cigarettes, and chewing tobacco.  Do not smoke. Smoking is a common cause of shortness of breath. If you need help quitting, ask your health care provider.  Avoid things that can irritate your airways, such as: ? Mold. ? Dust. ? Air pollution. ? Chemical fumes. ? Things that can cause allergy symptoms (allergens), if you have allergies.  Keep your living space clean and free of mold and dust.  Rest as needed. Slowly return to your usual activities.  Take over-the-counter and prescription medicines only as told by your health care provider. This includes oxygen therapy and inhaled medicines.  Keep all follow-up visits as told by your health care provider. This is important. Contact a health care provider if:  Your condition does not improve as soon as expected.  You have a hard time doing your normal activities, even after you rest.  You have new symptoms. Get help right away if:  Your shortness of breath gets worse.  You have shortness of breath when you are resting.  You feel light-headed or you faint.  You have a cough that is not controlled with medicines.  You cough up blood.  You have pain with breathing.  You have pain in your chest, arms, shoulders, or abdomen.  You have a fever.  You cannot walk up stairs or exercise the way that you normally do. These symptoms may represent a serious problem that is an emergency. Do not wait to see if the symptoms will go away. Get medical help right away. Call your local emergency services (911 in the [place] Do not drive yourself  to the hospital. Summary  Shortness of breath is when a person has trouble breathing enough air. It can be a sign of a medical problem.  Avoid things that irritate your lungs, such as smoking, pollution, mold, and dust.  Pay attention to changes in your symptoms and contact your health care provider if you have a hard time completing daily activities because of shortness of breath. This information is not intended to replace advice given to you by your health care provider. Make sure you discuss any questions you have with your health care provider. Document Revised: 06/26/2017 Document Reviewed: 06/26/2017 Elsevier Patient Education  2020 [employer]  

## 2019-08-27 NOTE — Assessment & Plan Note (Signed)
Working with Dr Ernestina Patches and may need further treatments.

## 2019-08-28 ENCOUNTER — Encounter: Payer: Self-pay | Admitting: *Deleted

## 2019-08-28 NOTE — Assessment & Plan Note (Addendum)
Has been doing well recently but does have persistent SOB with exertion

## 2019-08-28 NOTE — Progress Notes (Signed)
Subjective:    Patient ID: Amanda Copeland, female    DOB: [birth year], 79 y.o.   MRN: [id number]  Chief Complaint  Patient presents with  . Follow-up    HPI Patient is in today for follow up on chronic medical concerns. No recent febrile illness or hospitalizations. She struggles with daily right shoulder pain and is following with Dr Durward Fortes. She is also following with Dr Ernestina Patches regarding her low back pain. Not worsening. Her SOB is persistnet but not worsening. She has had a good report from cardiology, Dr Charmian Muff. Denies CP/palp/HA/congestion/fevers/GI or GU c/o. Taking meds as prescribed  Past Medical History:  Diagnosis Date  . Arthritis   . Asthma    childhood now returning  . Back pain affecting pregnancy 11/02/2014  . Benign essential tremor 10/28/2014  . Bilateral dry eyes   . Breast cancer ([place] [facility])    right  . Cancer ([facility])    Breast  . Dry eyes   . Encounter for Medicare annual wellness exam 05/22/2016  . Essential hypertension, benign 10/28/2014  . H/O measles   . H/O mumps   . History of chicken pox   . Lumbago 11/02/2014  . Mixed hyperlipidemia 10/28/2014  . Obesity 10/28/2014  . Stenosis of cervical spine     Past Surgical History:  Procedure Laterality Date  . BACK SURGERY    . BREAST REDUCTION SURGERY Left   . CHOLECYSTECTOMY    . EYE SURGERY  2008   b/l cataracts removed, in [place]  . MASTECTOMY Right   . REDUCTION MAMMAPLASTY    . REPLACEMENT TOTAL KNEE BILATERAL Bilateral   . ROTATOR CUFF REPAIR Left     Family History  Problem Relation Age of Onset  . Heart disease Mother   . Heart attack Mother   . Heart failure Mother   . Hypertension Mother   . Cancer Father        colon  . Asthma Father   . Parkinson's disease Father   . Cancer Maternal Grandmother        uterine  . Heart disease Maternal Grandfather   . Obesity Daughter   . Appendicitis Paternal Grandmother   . Stroke Neg Hx     Social History   Socioeconomic History  . Marital  status: Married    Spouse name: Not on file  . Number of children: 1  . Years of education: Not on file  . Highest education level: Master's degree (e.g., MA, MS, MEng, MEd, MSW, MBA)  Occupational History  . 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 Smoking status: Never Smoker  . Smokeless tobacco: Never Used  Vaping Use  . Vaping Use: Never used  Substance and Sexual Activity  . Alcohol use: Yes    Alcohol/week: 0.0 standard drinks    Comment: two times a month  . Drug use: No  . Sexual activity: Not on file    Comment: lives with husband, moved [place], no dietary restrictions, retired Education officer, museum  Other Topics Concern  . Not on file  Social History Narrative   Pt lives with Spouse at home 1 daughter   Drinks coffee, and soda   Right handed   Social Determinants of Health   Financial Resource Strain:   . Difficulty of Paying Living Expenses:   Food Insecurity:   . Worried About Charity fundraiser in the Last Year:   . [place] in the Last  Year:   Transportation Needs:   . Film/video editor (Medical):   Marland Kitchen Lack of Transportation (Non-Medical):   Physical Activity:   . Days of Exercise per Week:   . Minutes of Exercise per Session:   Stress:   . Feeling of Stress :   Social Connections:   . Frequency of Communication with Friends and Family:   . Frequency of Social Gatherings with Friends and Family:   . Attends Religious Services:   . Active Member of Clubs or Organizations:   . Attends Archivist Meetings:   Marland Kitchen Marital Status:   Intimate Partner Violence:   . Fear of Current or Ex-Partner:   . Emotionally Abused:   Marland Kitchen Physically Abused:   . Sexually Abused:     Outpatient Medications Prior to Visit  Medication Sig Dispense Refill  . albuterol (PROVENTIL HFA;VENTOLIN HFA) 108 (90 Base) MCG/ACT inhaler INHALE 2 PUFFS INTO THE LUNGS EVERY 4 (FOUR) HOURS AS NEEDED FOR WHEEZING OR SHORTNESS OF BREATH. 6.7 Inhaler  1  . allopurinol (ZYLOPRIM) 100 MG tablet TAKE 2 TABLETS BY MOUTH EVERY DAY 180 tablet 1  . Ascorbic Acid (VITAMIN C PO) Take by mouth.    Marland Kitchen aspirin 81 MG tablet Take 81 mg by mouth daily.    . Azelastine-Fluticasone 137-50 MCG/ACT SUSP 1 SPRAY PER NOSTRIL TWICE DAILY FOR RUNNY NOSE 23 g 0  . furosemide (LASIX) 40 MG tablet TAKE 1 TABLET BY MOUTH EVERY DAY 90 tablet 2  . KLOR-CON M20 20 MEQ tablet TAKE 1 TABLET BY MOUTH EVERY DAY 30 tablet 11  . levothyroxine (SYNTHROID) 112 MCG tablet TAKE 1 TABLET (112 MCG TOTAL) BY MOUTH DAILY BEFORE BREAKFAST. 90 tablet 1  . montelukast (SINGULAIR) 10 MG tablet TAKE 1 TABLET ONCE AT BEDTIME FOR COUGHING OR WHEEZING. 30 tablet 5  . Multiple Vitamins-Minerals (ICAPS AREDS 2) CAPS Take 1 capsule by mouth 2 (two) times daily.    . Multiple Vitamins-Minerals (ZINC PO) Take by mouth.    Marland Kitchen PREVACID 30 MG capsule TAKE 1 CAPSULE BY MOUTH TWICE A DAY --MAX LIMIT 60 capsule 5  . propranolol (INDERAL) 20 MG tablet Take 1 tablet (20 mg total) by mouth 2 (two) times daily. 180 tablet 3  . Red Yeast Rice Extract (RED YEAST RICE PO) Take by mouth daily.    [name] ER 180 MG 24 hr capsule TAKE 1 CAPSULE BY MOUTH EVERY DAY 90 capsule 1   No facility-administered medications prior to visit.    Allergies  Allergen Reactions  . Amoxicillin Rash  . Ampicillin Rash  . Penicillins Rash  . Statins Other (See Comments)    Per reports muscle aches and joint pain Per reports muscle aches and joint pain    Review of Systems  Constitutional: Negative for fever and malaise/fatigue.  HENT: Negative for congestion.   Eyes: Negative for blurred vision.  Respiratory: Positive for shortness of breath.   Cardiovascular: Negative for chest pain, palpitations and leg swelling.  Gastrointestinal: Negative for abdominal pain, blood in stool and nausea.  Genitourinary: Negative for dysuria and frequency.  Musculoskeletal: Positive for joint pain. Negative for falls.  Skin:  Negative for rash.  Neurological: Negative for dizziness, loss of consciousness and headaches.  Endo/Heme/Allergies: Negative for environmental allergies.  Psychiatric/Behavioral: Negative for depression. The patient is not nervous/anxious.        Objective:    Physical Exam  BP (!) 142/45 (BP Location: Left Wrist, Cuff Size: Normal)   Pulse 61  Temp 97.7 F (36.5 C) (Oral)   Resp 12   Ht 5\' 1"  (1.549 m)   Wt 253 lb 9.6 oz (115 kg)   SpO2 96%   BMI 47.92 kg/m  Wt Readings from Last 3 Encounters:  08/27/19 253 lb 9.6 oz (115 kg)  08/20/19 255 lb 6.4 oz (115.8 kg)  05/27/19 252 lb 12.8 oz (114.7 kg)    Diabetic Foot Exam - Simple   No data filed     Lab Results  Component Value Date   WBC 6.6 08/27/2019   HGB 13.0 08/27/2019   HCT 39.1 08/27/2019   PLT 349.0 08/27/2019   GLUCOSE 102 (H) 08/27/2019   CHOL 198 08/27/2019   TRIG 152.0 (H) 08/27/2019   HDL 52.40 08/27/2019   LDLDIRECT 115.0 10/27/2016   LDLCALC 115 (H) 08/27/2019   ALT 11 08/27/2019   AST 17 08/27/2019   NA 142 08/27/2019   K 4.3 08/27/2019   CL 104 08/27/2019   CREATININE 0.79 08/27/2019   BUN 12 08/27/2019   CO2 28 08/27/2019   TSH 1.33 08/27/2019   HGBA1C 5.7 08/27/2019    Lab Results  Component Value Date   TSH 1.33 08/27/2019   Lab Results  Component Value Date   WBC 6.6 08/27/2019   HGB 13.0 08/27/2019   HCT 39.1 08/27/2019   MCV 95.4 08/27/2019   PLT 349.0 08/27/2019   Lab Results  Component Value Date   NA 142 08/27/2019   K 4.3 08/27/2019   CO2 28 08/27/2019   GLUCOSE 102 (H) 08/27/2019   BUN 12 08/27/2019   CREATININE 0.79 08/27/2019   BILITOT 0.4 08/27/2019   ALKPHOS 109 08/27/2019   AST 17 08/27/2019   ALT 11 08/27/2019   PROT 6.2 08/27/2019   ALBUMIN 3.8 08/27/2019   CALCIUM 9.4 08/27/2019   GFR 70.08 08/27/2019   Lab Results  Component Value Date   CHOL 198 08/27/2019   Lab Results  Component Value Date   HDL 52.40 08/27/2019   Lab Results    Component Value Date   LDLCALC 115 (H) 08/27/2019   Lab Results  Component Value Date   TRIG 152.0 (H) 08/27/2019   Lab Results  Component Value Date   CHOLHDL 4 08/27/2019   Lab Results  Component Value Date   HGBA1C 5.7 08/27/2019       Assessment & Plan:   Problem List Items Addressed This Visit    Moderate persistent asthma    Has been doing well recently      Esophageal reflux   Essential hypertension, benign - Primary    Well controlled, no changes to meds. Encouraged heart healthy diet such as the DASH diet and exercise as tolerated.       Relevant Orders   CBC (Completed)   Comprehensive metabolic panel (Completed)   TSH (Completed)   Mixed hyperlipidemia   Relevant Orders   Lipid panel (Completed)   Low back pain    Working with Dr Ernestina Patches and may need further treatments.       Hyperglycemia    hgba1c acceptable, minimize simple carbs. Increase exercise as tolerated.       Relevant Orders   Hemoglobin A1c (Completed)   Gout    Hydrate and monitor uric acid, no recent flares      Dyspnea on exertion    Deconditioning likley multifactorial      Asthma    Following Dr Halford Chessman with pulmonology has some breathing tests set up next  week      Right shoulder pain    Responded to a steroid shot temporarily with Dr Durward Fortes. She feels she needs a new one now.          I am having Amanda Copeland "[name]" maintain her Red Yeast Rice Extract (RED YEAST RICE PO), aspirin, ICaps Areds 2, albuterol, Azelastine-Fluticasone, propranolol, Ascorbic Acid (VITAMIN C PO), Multiple Vitamins-Minerals (ZINC PO), Klor-Con M20, levothyroxine, furosemide, montelukast, allopurinol, Prevacid, and Tiadylt ER.  No orders of the defined types were placed in this encounter.    [name], MD

## 2019-08-30 ENCOUNTER — Encounter: Payer: Self-pay | Admitting: Family Medicine

## 2019-08-30 DIAGNOSIS — H35722 Serous detachment of retinal pigment epithelium, left eye: Secondary | ICD-10-CM | POA: Diagnosis not present

## 2019-08-30 DIAGNOSIS — H353112 Nonexudative age-related macular degeneration, right eye, intermediate dry stage: Secondary | ICD-10-CM | POA: Diagnosis not present

## 2019-08-30 DIAGNOSIS — H35363 Drusen (degenerative) of macula, bilateral: Secondary | ICD-10-CM | POA: Diagnosis not present

## 2019-08-30 DIAGNOSIS — H353122 Nonexudative age-related macular degeneration, left eye, intermediate dry stage: Secondary | ICD-10-CM | POA: Diagnosis not present

## 2019-08-30 DIAGNOSIS — H35453 Secondary pigmentary degeneration, bilateral: Secondary | ICD-10-CM | POA: Diagnosis not present

## 2019-08-30 DIAGNOSIS — H43813 Vitreous degeneration, bilateral: Secondary | ICD-10-CM | POA: Diagnosis not present

## 2019-09-04 ENCOUNTER — Ambulatory Visit: Payer: Medicare Other

## 2019-09-04 ENCOUNTER — Other Ambulatory Visit: Payer: Self-pay

## 2019-09-05 ENCOUNTER — Ambulatory Visit (INDEPENDENT_AMBULATORY_CARE_PROVIDER_SITE_OTHER): Payer: Medicare Other | Admitting: Pulmonary Disease

## 2019-09-05 DIAGNOSIS — R0600 Dyspnea, unspecified: Secondary | ICD-10-CM | POA: Diagnosis not present

## 2019-09-05 DIAGNOSIS — R0609 Other forms of dyspnea: Secondary | ICD-10-CM

## 2019-09-05 LAB — PULMONARY FUNCTION TEST
DL/VA % pred: 113 %
DL/VA: 4.75 ml/min/mmHg/L
DLCO cor % pred: 95 %
DLCO cor: 16.23 ml/min/mmHg
DLCO unc % pred: 94 %
DLCO unc: 16.03 ml/min/mmHg
FEF 25-75 Post: 1.73 L/sec
FEF 25-75 Pre: 1.11 L/sec
FEF2575-%Change-Post: 56 %
FEF2575-%Pred-Post: 134 %
FEF2575-%Pred-Pre: 85 %
FEV1-%Change-Post: 12 %
FEV1-%Pred-Post: 80 %
FEV1-%Pred-Pre: 71 %
FEV1-Post: 1.37 L
FEV1-Pre: 1.22 L
FEV1FVC-%Change-Post: 0 %
FEV1FVC-%Pred-Pre: 107 %
FEV6-%Change-Post: 12 %
FEV6-%Pred-Post: 79 %
FEV6-%Pred-Pre: 70 %
FEV6-Post: 1.72 L
FEV6-Pre: 1.52 L
FEV6FVC-%Pred-Post: 106 %
FEV6FVC-%Pred-Pre: 106 %
FVC-%Change-Post: 12 %
FVC-%Pred-Post: 74 %
FVC-%Pred-Pre: 66 %
FVC-Post: 1.72 L
FVC-Pre: 1.52 L
Post FEV1/FVC ratio: 79 %
Post FEV6/FVC ratio: 100 %
Pre FEV1/FVC ratio: 80 %
Pre FEV6/FVC Ratio: 100 %
RV % pred: 113 %
RV: 2.52 L
TLC % pred: 94 %
TLC: 4.36 L

## 2019-09-05 NOTE — Progress Notes (Signed)
Full PFT performed today. °

## 2019-09-11 ENCOUNTER — Other Ambulatory Visit: Payer: Self-pay | Admitting: Family Medicine

## 2019-09-16 NOTE — Progress Notes (Addendum)
Assessment/Plan:    1.  Essential Tremor  -Very limited on options.  We had tried to discontinue her propranolol in the past at the request of Dr. Melvyn Novas, but tremor got much worse.  She went back on it.  She no longer follows with Dr. Melvyn Novas, but now following with Dr. Halford Chessman.      -she thinks that tremor is getting worse.  Will try to slightly increase propranolol to 40 mg bid.  Will send note to Dr. Halford Chessman and pt to let me know if increased sob.  Addendum: spoke with Dr. Halford Chessman and he is okay with increased dose of propranolol 2.  History of gabapentin induced myoclonus  -Off of gabapentin 3.  Gait instability  -discussed PT  -given RX for rollator 4.  F/u 6-9 months Subjective:   Amanda Copeland was seen today in follow up for essential tremor.  My previous records were reviewed prior to todays visit. Tremor getting a bit worse.  Pt had a fall 2 weeks ago - was climbing out of a swimming pool and her husband was helping her and she fell up the step.  Pt denies lightheadedness, near syncope.  No hallucinations.  Mood has been good.  Current prescribed movement disorder medications: propranolol 20 mg bid   PREVIOUS MEDICATIONS:  propranolol (felt worsened asthma); metoprolol (didn't help); primidone (made too sleepy and didn't help at 100 mg daily); topamax (not helpful and SE); gabapentin (myoclonus)  ALLERGIES:   Allergies  Allergen Reactions  . Amoxicillin Rash  . Ampicillin Rash  . Penicillins Rash  . Statins Other (See Comments)    Per reports muscle aches and joint pain Per reports muscle aches and joint pain    CURRENT MEDICATIONS:  Outpatient Encounter Medications as of 09/18/2019  Medication Sig  . albuterol (PROVENTIL HFA;VENTOLIN HFA) 108 (90 Base) MCG/ACT inhaler INHALE 2 PUFFS INTO THE LUNGS EVERY 4 (FOUR) HOURS AS NEEDED FOR WHEEZING OR SHORTNESS OF BREATH.  Marland Kitchen allopurinol (ZYLOPRIM) 100 MG tablet TAKE 2 TABLETS BY MOUTH EVERY DAY  . Ascorbic Acid (VITAMIN C PO) Take 1  tablet by mouth daily.   Marland Kitchen aspirin 81 MG tablet Take 81 mg by mouth daily.  . Azelastine-Fluticasone 137-50 MCG/ACT SUSP 1 SPRAY PER NOSTRIL TWICE DAILY FOR RUNNY NOSE  . furosemide (LASIX) 40 MG tablet TAKE 1 TABLET BY MOUTH EVERY DAY  . KLOR-CON M20 20 MEQ tablet TAKE 1 TABLET BY MOUTH EVERY DAY  . levothyroxine (SYNTHROID) 112 MCG tablet Take 1 tablet (112 mcg total) by mouth daily before breakfast.  . montelukast (SINGULAIR) 10 MG tablet TAKE 1 TABLET ONCE AT BEDTIME FOR COUGHING OR WHEEZING.  . Multiple Vitamins-Minerals (ICAPS AREDS 2) CAPS Take 1 capsule by mouth 2 (two) times daily.  . Multiple Vitamins-Minerals (ZINC PO) Take by mouth.  Marland Kitchen PREVACID 30 MG capsule TAKE 1 CAPSULE BY MOUTH TWICE A DAY --MAX LIMIT  . propranolol (INDERAL) 20 MG tablet Take 1 tablet (20 mg total) by mouth 2 (two) times daily.  . Red Yeast Rice Extract (RED YEAST RICE PO) Take by mouth daily.  [name] ER 180 MG 24 hr capsule TAKE 1 CAPSULE BY MOUTH EVERY DAY   No facility-administered encounter medications on file as of 09/18/2019.     Objective:    PHYSICAL EXAMINATION:    VITALS:   Vitals:   09/18/19 1109  BP: (!) 148/81  Pulse: 66  SpO2: 96%  Weight: 248 lb (112.5 kg)  Height: 5\' 2"  (1.575  m)    GEN:  The patient appears stated age and is in NAD. HEENT:  Normocephalic, atraumatic.  The mucous membranes are moist. The superficial temporal arteries are without ropiness or tenderness. CV:  RRR Lungs:  CTAB Neck/HEME:  There are no carotid bruits bilaterally.  Neurological examination:  Orientation: The patient is alert and oriented x3. Cranial nerves: There is good facial symmetry. The speech is fluent and clear. Soft palate rises symmetrically and there is no tongue deviation. Hearing is intact to conversational tone. Sensation: Sensation is intact to light touch throughout Motor: Strength is at least antigravity x4.  Movement examination: Tone: There is normal tone in the  UE/LE Abnormal movements: There is no rest tremor.  There is no postural tremor.  There is very mild intention tremor on the right.  However, it becomes moderate when she is holding a weight. Coordination:  There is no decremation with RAM's Gait and Station: The patient pushes off of the chair.  She is slightly unsteady, wide based. I have reviewed and interpreted the following labs independently   Chemistry      Component Value Date/Time   NA 142 08/27/2019 1048   NA 145 (H) 06/11/2019 1141   K 4.3 08/27/2019 1048   CL 104 08/27/2019 1048   CO2 28 08/27/2019 1048   BUN 12 08/27/2019 1048   BUN 10 06/11/2019 1141   CREATININE 0.79 08/27/2019 1048      Component Value Date/Time   CALCIUM 9.4 08/27/2019 1048   ALKPHOS 109 08/27/2019 1048   AST 17 08/27/2019 1048   ALT 11 08/27/2019 1048   BILITOT 0.4 08/27/2019 1048      Lab Results  Component Value Date   WBC 6.6 08/27/2019   HGB 13.0 08/27/2019   HCT 39.1 08/27/2019   MCV 95.4 08/27/2019   PLT 349.0 08/27/2019   Lab Results  Component Value Date   TSH 1.33 08/27/2019     Chemistry      Component Value Date/Time   NA 142 08/27/2019 1048   NA 145 (H) 06/11/2019 1141   K 4.3 08/27/2019 1048   CL 104 08/27/2019 1048   CO2 28 08/27/2019 1048   BUN 12 08/27/2019 1048   BUN 10 06/11/2019 1141   CREATININE 0.79 08/27/2019 1048      Component Value Date/Time   CALCIUM 9.4 08/27/2019 1048   ALKPHOS 109 08/27/2019 1048   AST 17 08/27/2019 1048   ALT 11 08/27/2019 1048   BILITOT 0.4 08/27/2019 1048         Total time spent on today's visit was 30 minutes, including both face-to-face time and nonface-to-face time.  Time included that spent on review of records (prior notes available to me/labs/imaging if pertinent), discussing treatment and goals, answering patient's questions and coordinating care.  Cc:  [name], MD

## 2019-09-18 ENCOUNTER — Encounter: Payer: Self-pay | Admitting: Neurology

## 2019-09-18 ENCOUNTER — Other Ambulatory Visit: Payer: Self-pay

## 2019-09-18 ENCOUNTER — Ambulatory Visit (INDEPENDENT_AMBULATORY_CARE_PROVIDER_SITE_OTHER): Payer: Medicare Other | Admitting: Neurology

## 2019-09-18 VITALS — BP 148/81 | HR 66 | Ht 62.0 in | Wt 248.0 lb

## 2019-09-18 DIAGNOSIS — G250 Essential tremor: Secondary | ICD-10-CM | POA: Diagnosis not present

## 2019-09-18 DIAGNOSIS — R2681 Unsteadiness on feet: Secondary | ICD-10-CM | POA: Diagnosis not present

## 2019-09-18 MED ORDER — PROPRANOLOL HCL 40 MG PO TABS: 40.0000 mg | ORAL_TABLET | Freq: Three times a day (TID) | ORAL | 2 refills | Status: DC

## 2019-09-18 MED ORDER — AMBULATORY NON FORMULARY MEDICATION: 0 refills | Status: AC

## 2019-09-18 NOTE — Patient Instructions (Signed)
We will refer you to [facility]:  Address: [address], [place], [place] [address] Phone: [phone]

## 2019-09-30 DIAGNOSIS — R2681 Unsteadiness on feet: Secondary | ICD-10-CM | POA: Diagnosis not present

## 2019-10-02 DIAGNOSIS — Z23 Encounter for immunization: Secondary | ICD-10-CM | POA: Diagnosis not present

## 2019-10-03 DIAGNOSIS — R2681 Unsteadiness on feet: Secondary | ICD-10-CM | POA: Diagnosis not present

## 2019-10-08 DIAGNOSIS — R2681 Unsteadiness on feet: Secondary | ICD-10-CM | POA: Diagnosis not present

## 2019-10-10 DIAGNOSIS — R2681 Unsteadiness on feet: Secondary | ICD-10-CM | POA: Diagnosis not present

## 2019-10-17 DIAGNOSIS — R2681 Unsteadiness on feet: Secondary | ICD-10-CM | POA: Diagnosis not present

## 2019-10-18 ENCOUNTER — Other Ambulatory Visit: Payer: Self-pay

## 2019-10-18 ENCOUNTER — Encounter: Payer: Self-pay | Admitting: Pulmonary Disease

## 2019-10-18 ENCOUNTER — Ambulatory Visit (INDEPENDENT_AMBULATORY_CARE_PROVIDER_SITE_OTHER): Payer: Medicare Other | Admitting: Pulmonary Disease

## 2019-10-18 VITALS — BP 122/74 | HR 52 | Temp 97.6°F | Ht 62.0 in | Wt 250.8 lb

## 2019-10-18 DIAGNOSIS — Z6841 Body Mass Index (BMI) 40.0 and over, adult: Secondary | ICD-10-CM | POA: Diagnosis not present

## 2019-10-18 DIAGNOSIS — R0609 Other forms of dyspnea: Secondary | ICD-10-CM

## 2019-10-18 DIAGNOSIS — R0600 Dyspnea, unspecified: Secondary | ICD-10-CM | POA: Diagnosis not present

## 2019-10-18 DIAGNOSIS — R5381 Other malaise: Secondary | ICD-10-CM

## 2019-10-18 NOTE — Patient Instructions (Signed)
Will arrange for cardiopulmonary exercise test and call to schedule follow up after test result available

## 2019-10-18 NOTE — Progress Notes (Signed)
[facility]  Chief Complaint  Patient presents with  . Follow-up    8wk f/u after PFT. States her breathing has been stable since last visit. Still SOB with long distance walks.     Constitutional:  BP 122/74   Pulse (!) 52   Temp 97.6 F (36.4 C) (Temporal)   Ht 5\' 2"  (1.575 m)   Wt 250 lb 12.8 oz (113.8 kg)   SpO2 98% Comment: on RA  BMI 45.87 kg/m   Past Medical History:  OA, Tremor, Breast cancer Rt, HTN, HLD  Past Surgical History:  Her  has a past surgical history that includes Mastectomy (Right); Back surgery; Rotator cuff repair (Left); Replacement total knee bilateral (Bilateral); Breast reduction surgery (Left); Cholecystectomy; Eye surgery (2008); and Reduction mammaplasty.  Brief Summary:  Amanda Copeland is a 79 y.o. female with dyspnea.      Subjective:   She had PFT.  Showed mild air trapping and bronchodilator response.  She has been on several inhalers previously w/o benefit.  She is not having cough, wheeze, or sputum.  She has been using montelukast, but she hasn't noticed any difference.    She can only walk about 10 to 20 feet before getting winded.  She uses propranolol for tremor.  She was tried off this and her tremor was very debilitating.  She has mild snoring but otherwise feels that her sleep is okay.  Labs from 08/27/19: WBC 6.6, Hb 13, PLT 349, Na 142, K 4.3, CO2 28, Creatinine 0.79.  She was only able to walk one lap in office today before having to stop and rest.  Her SpO2 remained about 90%.  Her heart rate started at 56 and went up to only 67.  Physical Exam:   Appearance - well kempt   ENMT - no sinus tenderness, no oral exudate, no LAN, Mallampati 3 airway, no stridor  Respiratory - equal breath sounds bilaterally, no wheezing or rales  CV - s1s2 regular rate and rhythm, no murmurs  Ext - no clubbing, no edema  Skin - no rashes  Psych - normal mood and affect   Pulmonary testing:    PFT 11/28/16 >> FEV1 1.54 (86%), FEV1% 77, TLC 4.24 (92%), DLCO 71%  PFT 09/05/19 >> FEV1 1.37 (80%), FEV1% 79, TLC 4.36 (94%), DLCO 94%, +BD  Cardiac Tests:   Echo 06/25/19 >> EF 60 to 65%, grade 1 DD  Social History:  She  reports that she has never smoked. She has never used smokeless tobacco. She reports current alcohol use. She reports that she does not use drugs.  Family History:  Her family history includes Appendicitis in her paternal grandmother; Asthma in her father; Cancer in her father and maternal grandmother; Heart attack in her mother; Heart disease in her maternal grandfather and mother; Heart failure in her mother; Hypertension in her mother; Obesity in her daughter; Parkinson's disease in her father.     Assessment/Plan:   Dyspnea on exertion. - recent lab work was unremarkable - she had recent cardiac assessment that was unrevealing - while she has mild changes on PFT that could be suggestive of asthma, she has not notice any improvement with inhaler therapy - obesity and deconditioning likely playing a role - she is on propranolol for tremor and this could be impact chronotropic response to exercise and contributing to dyspnea, but she was tried off propranolol previously and her tremor was extremely debilitating - will arrange for cardiopulmonary exercise  test to further assess causes of her dyspnea  Snoring. - she does not feel that her sleep is an issue and would prefer to defer sleep testing at this time  Tremor. - followed by Dr. Carles Collet - explained how propranolol could be impacting her exercise tolerance and symptoms of dyspnea, but she feels uncontrolled tremor has tremendously more impact on her quality of life than her breathing issues do  Time Spent Involved in Patient Care on Day of Examination:  35 minutes  Follow up:  Patient Instructions  Will arrange for cardiopulmonary exercise test and call to schedule follow up after test result  available   Medication List:   Allergies as of 10/18/2019      Reactions   Amoxicillin Rash   Ampicillin Rash   Penicillins Rash   Statins Other (See Comments)   Per reports muscle aches and joint pain Per reports muscle aches and joint pain      Medication List       Accurate as of October 18, 2019 10:48 AM. If you have any questions, ask your nurse or doctor.        albuterol 108 (90 Base) MCG/ACT inhaler Commonly known as: VENTOLIN HFA INHALE 2 PUFFS INTO THE LUNGS EVERY 4 (FOUR) HOURS AS NEEDED FOR WHEEZING OR SHORTNESS OF BREATH.   allopurinol 100 MG tablet Commonly known as: ZYLOPRIM TAKE 2 TABLETS BY MOUTH EVERY DAY   AMBULATORY NON FORMULARY MEDICATION Rollator  Dx: Gait instability   aspirin 81 MG tablet Take 81 mg by mouth daily.   Azelastine-Fluticasone 137-50 MCG/ACT Susp 1 SPRAY PER NOSTRIL TWICE DAILY FOR RUNNY NOSE   furosemide 40 MG tablet Commonly known as: LASIX TAKE 1 TABLET BY MOUTH EVERY DAY   ICaps Areds 2 Caps Take 1 capsule by mouth 2 (two) times daily.   Klor-Con M20 20 MEQ tablet Generic drug: potassium chloride SA TAKE 1 TABLET BY MOUTH EVERY DAY   levothyroxine 112 MCG tablet Commonly known as: SYNTHROID Take 1 tablet (112 mcg total) by mouth daily before breakfast.   montelukast 10 MG tablet Commonly known as: SINGULAIR TAKE 1 TABLET ONCE AT BEDTIME FOR COUGHING OR WHEEZING.   Prevacid 30 MG capsule Generic drug: lansoprazole TAKE 1 CAPSULE BY MOUTH TWICE A DAY --MAX LIMIT   propranolol 40 MG tablet Commonly known as: INDERAL Take 1 tablet (40 mg total) by mouth 3 (three) times daily.   RED YEAST RICE PO Take by mouth daily.   Tiadylt ER 180 MG 24 hr capsule Generic drug: diltiazem TAKE 1 CAPSULE BY MOUTH EVERY DAY   VITAMIN C PO Take 1 tablet by mouth daily.   ZINC PO Take by mouth.       Signature:  Chesley Mires, MD [place] Pager - [phone] 10/18/2019, 10:48 AM

## 2019-10-21 ENCOUNTER — Encounter: Payer: Self-pay | Admitting: Family Medicine

## 2019-10-21 ENCOUNTER — Ambulatory Visit (HOSPITAL_BASED_OUTPATIENT_CLINIC_OR_DEPARTMENT_OTHER)
Admission: RE | Admit: 2019-10-21 | Discharge: 2019-10-21 | Disposition: A | Discharge status: Home / Self Care | Payer: Medicare Other | Source: Ambulatory Visit | Attending: Family Medicine | Admitting: Family Medicine

## 2019-10-21 ENCOUNTER — Ambulatory Visit (INDEPENDENT_AMBULATORY_CARE_PROVIDER_SITE_OTHER): Payer: Medicare Other | Admitting: Family Medicine

## 2019-10-21 ENCOUNTER — Ambulatory Visit: Payer: Self-pay

## 2019-10-21 ENCOUNTER — Other Ambulatory Visit: Payer: Self-pay

## 2019-10-21 VITALS — Ht 61.0 in | Wt 251.0 lb

## 2019-10-21 DIAGNOSIS — M19011 Primary osteoarthritis, right shoulder: Secondary | ICD-10-CM | POA: Diagnosis not present

## 2019-10-21 NOTE — Progress Notes (Signed)
Amanda Copeland - 79 y.o. female MRN [id number]  Date of birth: 1940/08/12  SUBJECTIVE:  Including CC & ROS.  Chief Complaint  Patient presents with  . Arm Pain    right     Amanda Copeland is a 79 y.o. female that is presenting with right shoulder pain.  Symptoms been ongoing for a few weeks.  She is having trouble with her range of motion.  No improvement with modalities to date.  Seems localized to the shoulder.  No history of surgery.   Review of Systems See HPI   HISTORY: Past Medical, Surgical, Social, and Family History Reviewed & Updated per EMR.   Pertinent Historical Findings include:  Past Medical History:  Diagnosis Date  . Arthritis   . Asthma    childhood now returning  . Back pain affecting pregnancy 11/02/2014  . Benign essential tremor 10/28/2014  . Bilateral dry eyes   . Breast cancer ([place])    right  . Cancer (HCC)    Breast  . Dry eyes   . Encounter for Medicare annual wellness exam 05/22/2016  . Essential hypertension, benign 10/28/2014  . H/O measles   . H/O mumps   . History of chicken pox   . Lumbago 11/02/2014  . Mixed hyperlipidemia 10/28/2014  . Obesity 10/28/2014  . Stenosis of cervical spine     Past Surgical History:  Procedure Laterality Date  . BACK SURGERY    . BREAST REDUCTION SURGERY Left   . CHOLECYSTECTOMY    . EYE SURGERY  2008   b/l cataracts removed, in [place]  . MASTECTOMY Right   . REDUCTION MAMMAPLASTY    . REPLACEMENT TOTAL KNEE BILATERAL Bilateral   . ROTATOR CUFF REPAIR Left     Family History  Problem Relation Age of Onset  . Heart disease Mother   . Heart attack Mother   . Heart failure Mother   . Hypertension Mother   . Cancer Father        colon  . Asthma Father   . Parkinson's disease Father   . Cancer Maternal Grandmother        uterine  . Heart disease Maternal Grandfather   . Obesity Daughter   . Appendicitis Paternal Grandmother   . Stroke Neg Hx     Social History   Socioeconomic History  .  Marital status: Married    Spouse name: Not on file  . Number of children: 1  . Years of education: Not on file  . Highest education level: Master's degree (e.g., MA, MS, MEng, MEd, MSW, MBA)  Occupational History  . 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 Smoking status: Never Smoker  . Smokeless tobacco: Never Used  Vaping Use  . Vaping Use: Never used  Substance and Sexual Activity  . Alcohol use: Yes    Alcohol/week: 0.0 standard drinks    Comment: two times a month  . Drug use: No  . Sexual activity: Not on file    Comment: lives with husband, moved [place], no dietary restrictions, retired Education officer, museum  Other Topics Concern  . Not on file  Social History Narrative   Pt lives with Spouse at home 1 daughter   Drinks coffee, and soda   Right handed   Social Determinants of Health   Financial Resource Strain:   . Difficulty of Paying Living Expenses: Not on file  Food Insecurity:   . Worried About Charity fundraiser  in the Last Year: Not on file  . Ran Out of Food in the Last Year: Not on file  Transportation Needs:   . Lack of Transportation (Medical): Not on file  . Lack of Transportation (Non-Medical): Not on file  Physical Activity:   . Days of Exercise per Week: Not on file  . Minutes of Exercise per Session: Not on file  Stress:   . Feeling of Stress : Not on file  Social Connections:   . Frequency of Communication with Friends and Family: Not on file  . Frequency of Social Gatherings with Friends and Family: Not on file  . Attends Religious Services: Not on file  . Active Member of Clubs or Organizations: Not on file  . Attends Archivist Meetings: Not on file  . Marital Status: Not on file  Intimate Partner Violence:   . Fear of Current or Ex-Partner: Not on file  . Emotionally Abused: Not on file  . Physically Abused: Not on file  . Sexually Abused: Not on file     PHYSICAL EXAM:  VS: Ht 5\' 1"  (1.549 m)   Wt  251 lb (113.9 kg)   BMI 47.43 kg/m  Physical Exam Gen: NAD, alert, cooperative with exam, well-appearing MSK:  Right shoulder: Limitation with external rotation. Limitation with external rotation abduction. Negative empty can test. Neurovascularly intact   Aspiration/Injection Procedure Note [name] 12/28/1940  Procedure: Injection Indications: Right shoulder pain  Procedure Details Consent: Risks of procedure as well as the alternatives and risks of each were explained to the (patient/caregiver).  Consent for procedure obtained. Time Out: Verified patient identification, verified procedure, site/side was marked, verified correct patient position, special equipment/implants available, medications/allergies/relevent history reviewed, required imaging and test results available.  Performed.  The area was cleaned with iodine and alcohol swabs.    The right glenohumeral joint was injected using 1 cc's of 40 mg Kenalog and 4 cc's of 0.25% bupivacaine with a 22  1/2" needle.  Ultrasound was used. Images were obtained in short views showing the injection.     A sterile dressing was applied.  Patient did tolerate procedure well.     ASSESSMENT & PLAN:   Primary osteoarthritis of right shoulder Symptoms seem more related to the joint and degenerative nature.  May have a capsulitis as well. -Counseled on home exercise therapy and supportive care. -Referral to physical therapy. -Injection. -X-ray. -Could consider subacromial injection.

## 2019-10-21 NOTE — Patient Instructions (Signed)
Nice to meet you Please try ice  I sent the referral to the physical therapy place that you are currently attending I will call with the results from today  Please send me a message in [place] with any questions or updates.  Please see me back in 4 weeks.   --Dr. Raeford Razor

## 2019-10-22 ENCOUNTER — Telehealth: Payer: Self-pay | Admitting: Family Medicine

## 2019-10-22 DIAGNOSIS — R2681 Unsteadiness on feet: Secondary | ICD-10-CM | POA: Diagnosis not present

## 2019-10-22 NOTE — Assessment & Plan Note (Signed)
Symptoms seem more related to the joint and degenerative nature.  May have a capsulitis as well. -Counseled on home exercise therapy and supportive care. -Referral to physical therapy. -Injection. -X-ray. -Could consider subacromial injection.

## 2019-10-22 NOTE — Telephone Encounter (Signed)
Informed of results.   Rosemarie Ax, MD [facility] 10/22/2019, 5:19 PM

## 2019-10-23 ENCOUNTER — Other Ambulatory Visit: Payer: Self-pay | Admitting: Neurology

## 2019-10-24 DIAGNOSIS — R2681 Unsteadiness on feet: Secondary | ICD-10-CM | POA: Diagnosis not present

## 2019-10-25 ENCOUNTER — Telehealth: Payer: Self-pay

## 2019-10-25 NOTE — Telephone Encounter (Signed)
Referral  Type of referral: Physical Therapy   Provider Office/ Name: [facility]  Phone: [phone]   Fax: [phone]  Address: n/a   Appointment Date and Time: 10/05/19

## 2019-11-05 DIAGNOSIS — Z23 Encounter for immunization: Secondary | ICD-10-CM | POA: Diagnosis not present

## 2019-11-06 DIAGNOSIS — R2681 Unsteadiness on feet: Secondary | ICD-10-CM | POA: Diagnosis not present

## 2019-11-08 DIAGNOSIS — R2681 Unsteadiness on feet: Secondary | ICD-10-CM | POA: Diagnosis not present

## 2019-11-11 DIAGNOSIS — R2681 Unsteadiness on feet: Secondary | ICD-10-CM | POA: Diagnosis not present

## 2019-11-15 DIAGNOSIS — R2681 Unsteadiness on feet: Secondary | ICD-10-CM | POA: Diagnosis not present

## 2019-11-18 ENCOUNTER — Other Ambulatory Visit: Payer: Self-pay

## 2019-11-18 ENCOUNTER — Ambulatory Visit (HOSPITAL_COMMUNITY): Payer: Medicare Other | Attending: Pulmonary Disease

## 2019-11-18 DIAGNOSIS — R0600 Dyspnea, unspecified: Secondary | ICD-10-CM | POA: Insufficient documentation

## 2019-11-18 DIAGNOSIS — R0609 Other forms of dyspnea: Secondary | ICD-10-CM

## 2019-11-19 DIAGNOSIS — R2681 Unsteadiness on feet: Secondary | ICD-10-CM | POA: Diagnosis not present

## 2019-11-20 ENCOUNTER — Ambulatory Visit (INDEPENDENT_AMBULATORY_CARE_PROVIDER_SITE_OTHER): Payer: Medicare Other | Admitting: Family Medicine

## 2019-11-20 ENCOUNTER — Telehealth: Payer: Self-pay

## 2019-11-20 ENCOUNTER — Other Ambulatory Visit: Payer: Self-pay

## 2019-11-20 DIAGNOSIS — M545 Low back pain, unspecified: Secondary | ICD-10-CM

## 2019-11-20 DIAGNOSIS — M19011 Primary osteoarthritis, right shoulder: Secondary | ICD-10-CM | POA: Diagnosis not present

## 2019-11-20 NOTE — Patient Instructions (Signed)
Good to see you Please use ice as needed Please continue the exercises  Please see if Dr. Ernestina Patches can do L4-L5 and L5-S1 facet injections   Please send me a message in MyChart with any questions or updates.  Please see [place] back after your trip.   --Dr. Raeford Razor

## 2019-11-20 NOTE — Progress Notes (Signed)
Amanda Copeland - 79 y.o. female MRN [id number]  Date of birth: 21-Dec-1940  SUBJECTIVE:  Including CC & ROS.  No chief complaint on file.   Amanda Copeland is a 79 y.o. female that is following up for her right shoulder pain but also presenting with low back pain.  She has history of degenerative changes on previous MRI.  She is also received epidural for spinal stenosis.  She is doing well since the injection into her right shoulder.   Review of Systems See HPI   HISTORY: Past Medical, Surgical, Social, and Family History Reviewed & Updated per EMR.   Pertinent Historical Findings include:  Past Medical History:  Diagnosis Date  . Arthritis   . Asthma    childhood now returning  . Back pain affecting pregnancy 11/02/2014  . Benign essential tremor 10/28/2014  . Bilateral dry eyes   . Breast cancer ([place])    right  . Cancer (HCC)    Breast  . Dry eyes   . Encounter for Medicare annual wellness exam 05/22/2016  . Essential hypertension, benign 10/28/2014  . H/O measles   . H/O mumps   . History of chicken pox   . Lumbago 11/02/2014  . Mixed hyperlipidemia 10/28/2014  . Obesity 10/28/2014  . Stenosis of cervical spine     Past Surgical History:  Procedure Laterality Date  . BACK SURGERY    . BREAST REDUCTION SURGERY Left   . CHOLECYSTECTOMY    . EYE SURGERY  2008   b/l cataracts removed, in [place]  . MASTECTOMY Right   . REDUCTION MAMMAPLASTY    . REPLACEMENT TOTAL KNEE BILATERAL Bilateral   . ROTATOR CUFF REPAIR Left     Family History  Problem Relation Age of Onset  . Heart disease Mother   . Heart attack Mother   . Heart failure Mother   . Hypertension Mother   . Cancer Father        colon  . Asthma Father   . Parkinson's disease Father   . Cancer Maternal Grandmother        uterine  . Heart disease Maternal Grandfather   . Obesity Daughter   . Appendicitis Paternal Grandmother   . Stroke Neg Hx     Social History   Socioeconomic History  . Marital  status: Married    Spouse name: Not on file  . Number of children: 1  . Years of education: Not on file  . Highest education level: Master's degree (e.g., MA, MS, MEng, MEd, MSW, MBA)  Occupational History  . 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 Smoking status: Never Smoker  . Smokeless tobacco: Never Used  Vaping Use  . Vaping Use: Never used  Substance and Sexual Activity  . Alcohol use: Yes    Alcohol/week: 0.0 standard drinks    Comment: two times a month  . Drug use: No  . Sexual activity: Not on file    Comment: lives with husband, moved [place], no dietary restrictions, retired Education officer, museum  Other Topics Concern  . Not on file  Social History Narrative   Pt lives with Spouse at home 1 daughter   Drinks coffee, and soda   Right handed   Social Determinants of Health   Financial Resource Strain:   . Difficulty of Paying Living Expenses: Not on file  Food Insecurity:   . Worried About Charity fundraiser in the Last Year: Not on file  .  Ran Out of Food in the Last Year: Not on file  Transportation Needs:   . Lack of Transportation (Medical): Not on file  . Lack of Transportation (Non-Medical): Not on file  Physical Activity:   . Days of Exercise per Week: Not on file  . Minutes of Exercise per Session: Not on file  Stress:   . Feeling of Stress : Not on file  Social Connections:   . Frequency of Communication with Friends and Family: Not on file  . Frequency of Social Gatherings with Friends and Family: Not on file  . Attends Religious Services: Not on file  . Active Member of Clubs or Organizations: Not on file  . Attends Archivist Meetings: Not on file  . Marital Status: Not on file  Intimate Partner Violence:   . Fear of Current or Ex-Partner: Not on file  . Emotionally Abused: Not on file  . Physically Abused: Not on file  . Sexually Abused: Not on file     PHYSICAL EXAM:  VS: There were no vitals taken for  this visit. Physical Exam Gen: NAD, alert, cooperative with exam, well-appearing    ASSESSMENT & PLAN:   Low back pain She has degenerative changes in the facet joints.  Does get pain in her lower back which is likely attributed to these degenerative change of the facet joints. -Would consider facet injections.  Primary osteoarthritis of right shoulder Doing well with previous injection. -Counseled on home exercise therapy and supportive care. -Could consider subacromial injection if needed.

## 2019-11-20 NOTE — Telephone Encounter (Signed)
Patient called she wants to schedule a appointment with Dr.Newton call [phone]

## 2019-11-20 NOTE — Telephone Encounter (Signed)
Called pt back and sch ov 10/20.

## 2019-11-20 NOTE — Assessment & Plan Note (Signed)
She has degenerative changes in the facet joints.  Does get pain in her lower back which is likely attributed to these degenerative change of the facet joints. -Would consider facet injections.

## 2019-11-20 NOTE — Assessment & Plan Note (Signed)
Doing well with previous injection. -Counseled on home exercise therapy and supportive care. -Could consider subacromial injection if needed.

## 2019-11-21 ENCOUNTER — Telehealth: Payer: Self-pay | Admitting: Pulmonary Disease

## 2019-11-21 DIAGNOSIS — R2681 Unsteadiness on feet: Secondary | ICD-10-CM | POA: Diagnosis not present

## 2019-11-21 NOTE — Telephone Encounter (Signed)
CPST 11/18/19 >> submaximal effort, mild reduction inf functional capacity, ventilatory limitation due to body habitus   Please schedule ROV with me or NP to discuss results of cardiopulmonary exercise test

## 2019-11-22 NOTE — Telephone Encounter (Addendum)
Called and left message on voicemail per DPR confirming already scheduled appointment coming up on 11/25/2019 at 10:30am with Tonia Brooms NP at the [place] office. Dr Halford Chessman aware there is an appointment scheduled so NP can go over results of cardiopulmonary exercise test at that appointment. Nothing further needed at this time. Advised patient to please call us back if there is anything needed or changes. Contact number provided.

## 2019-11-25 ENCOUNTER — Other Ambulatory Visit: Payer: Self-pay

## 2019-11-25 ENCOUNTER — Encounter: Payer: Self-pay | Admitting: Pulmonary Disease

## 2019-11-25 ENCOUNTER — Ambulatory Visit (INDEPENDENT_AMBULATORY_CARE_PROVIDER_SITE_OTHER): Payer: Medicare Other | Admitting: Pulmonary Disease

## 2019-11-25 VITALS — BP 118/80 | HR 51 | Temp 97.3°F | Ht 61.0 in | Wt 252.2 lb

## 2019-11-25 DIAGNOSIS — J4540 Moderate persistent asthma, uncomplicated: Secondary | ICD-10-CM

## 2019-11-25 DIAGNOSIS — R5381 Other malaise: Secondary | ICD-10-CM | POA: Insufficient documentation

## 2019-11-25 DIAGNOSIS — J310 Chronic rhinitis: Secondary | ICD-10-CM

## 2019-11-25 DIAGNOSIS — R0600 Dyspnea, unspecified: Secondary | ICD-10-CM

## 2019-11-25 DIAGNOSIS — R0609 Other forms of dyspnea: Secondary | ICD-10-CM

## 2019-11-25 DIAGNOSIS — G250 Essential tremor: Secondary | ICD-10-CM | POA: Diagnosis not present

## 2019-11-25 DIAGNOSIS — Z9189 Other specified personal risk factors, not elsewhere classified: Secondary | ICD-10-CM | POA: Diagnosis not present

## 2019-11-25 NOTE — Telephone Encounter (Signed)
Dr. Lamonte Sakai,  Please see patient comment from the mychart.  Thank you.

## 2019-11-25 NOTE — Assessment & Plan Note (Signed)
Plan: Home sleep study ordered 

## 2019-11-25 NOTE — Assessment & Plan Note (Signed)
Plan: Keep follow-up with neurology Can discuss with neurology if there is other options available for management of essential tremor

## 2019-11-25 NOTE — Assessment & Plan Note (Signed)
History of cardiac eval which was unrevealing Pulmonary function testing shows air trapping Previous history of inhaler trials without clinical improvement Cardiopulmonary exercise test shows minimal effort.  Likely physical deconditioning Patient morbidly obese BMI 47.6 Patient leads sedentary lifestyle  Plan: I believe dyspnea on exertion is multifactorial  Encourage patient to work on increasing overall physical activity Walk today in office, oxygen level stable on room air patient was able to complete 1 lap, was not able to complete second or third lap due to physical deconditioning

## 2019-11-25 NOTE — Assessment & Plan Note (Signed)
Plan: Continue rescue inhaler use as needed Patient to send a picture of her inhalers via MyChart so we can update our records Encourage patient to contact Dr. Mariane Masters team to clarify inhaler use

## 2019-11-25 NOTE — Telephone Encounter (Signed)
Pt's mychart message was sent to Dr. Lamonte Sakai by mistake. Sending this to Aaron Edelman for him to review in regards to what inhalers pt is doing.  Aaron Edelman, please see first mychart message sent by pt in regards to her inhalers.

## 2019-11-25 NOTE — Patient Instructions (Addendum)
You were seen today by Lauraine Rinne, NP  for:   1. Dyspnea on exertion  We reviewed your cardiopulmonary exercise test today  Walk today in office  2. Physical deconditioning  Please work on consistently improving your overall physical activity each day.  Would recommend starting with 5 minutes of walking in the morning as well as in the evening daily Can increase time by 1 minute each week  3. At risk for obstructive sleep apnea  - Home sleep test; Future  4. Moderate persistent asthma, unspecified whether complicated 5. Chronic nonallergic rhinitis  Please go home and send [place] pictures of the inhalers that you are currently utilizing  It appears based off of chart review that in April/2021 Dr. Verlin Fester with allergy asthma stop your Flovent HFA which was your last maintenance inhaler from what I could see  It may be beneficial to contact allergy asthma Dr. Mariane Masters team for further clarification about your inhalers  Only use your albuterol as a rescue medication to be used if you can't catch your breath by resting or doing a relaxed purse lip breathing pattern.  - The less you use it, the better it will work when you need it. - Ok to use up to 2 puffs  every 4 hours if you must but call for immediate appointment if use goes up over your usual need - Don't leave home without it !!  (think of it like the spare tire for your car)   6. Morbid obesity ([place])  As discussed today work on increasing your overall physical activity  Improve sedentary lifestyle to work to reduce your BMI  7. Benign essential tremor  Please follow-up with neurology and see if there are any other medications that could be utilized to help with management of your tremor instead of propanolol   We recommend today:  Orders Placed This Encounter  Procedures  . Home sleep test    Standing Status:   Future    Standing Expiration Date:   11/24/2020    Order Specific Question:   Where should this test be  performed:    Answer:   [facility]   Orders Placed This Encounter  Procedures  . Home sleep test   No orders of the defined types were placed in this encounter.   Follow Up:    Return in about 2 months (around 01/25/2020), or if symptoms worsen or fail to improve, for Follow up with Dr. Halford Chessman.   Notification of test results are managed in the following manner: If there are  any recommendations or changes to the  plan of care discussed in office today,  we will contact you and let you know what they are. If you do not hear from [place], then your results are normal and you can view them through your  MyChart account , or a letter will be sent to you. Thank you again for trusting [place] with your care  - Thank you, [facility]    It is flu season:   >>> Best ways to protect herself from the flu: Receive the yearly flu vaccine, practice good hand hygiene washing with soap and also using hand sanitizer when available, eat a nutritious meals, get adequate rest, hydrate appropriately       Please contact the office if your symptoms worsen or you have concerns that you are not improving.   Thank you for choosing [facility] for your healthcare, and for allowing [place] to partner  with you on your healthcare journey. I am thankful to be able to provide care to you today.   Wyn Quaker FNP-C

## 2019-11-25 NOTE — Progress Notes (Signed)
@Patient  ID: Amanda Copeland, female    DOB: [birth year], 79 y.o.   MRN: [id number]  Chief Complaint  Patient presents with  . Follow-up    CPET 11/18/19, patient feeling good overall, still some shortness of breath all the time worse with exertion    Referring provider: [name], MD  HPI:  79 year old female never smoker found our office for asthma, dyspnea on exertion, nonallergic rhinitis  PMH: GERD, hyperlipidemia, gout, morbid obesity Smoker/ Smoking History: Never smoker Maintenance:   Pt of: Dr. Halford Chessman  11/25/2019  - Visit   79 year old female never smoker presenting to office today to discuss recent cardiopulmonary stress test.  Patient is followed by Dr. Halford Chessman for dyspnea on exertion.  Patient was last seen by Dr. Halford Chessman in September/2021. Patient has had a cardiac evaluation which was unrevealing.  Her pulmonary function testing could be suggestive of asthma that she has not noticed any improvement with inhalers in the past.  Obesity and deconditioning can be playing a role.  She is also on propanolol for tremor which could be just contributing to her dyspnea.  11/18/2019-cardiopulmonary exercise test-submaximal effort, there is no clear cardiopulmonary limitation.  Ventilatory limitation related to her body habitus.  Patient is also been evaluated by allergy asthma Dr. Verlin Fester.  Last seen in April/2021.  That assessment and plan as listed below: April/2021 - [place]  Assessment and plan: Other forms of dyspnea Spirometry today revealed FEV1 was 84% predicted without significant postbronchodilator improvement.  She has not had significant reversibility during the past 3 office visits despite performing spirometry during symptomatic periods.  The patient is to seek further evaluation and treatment with a pulmonologist and cardiologist.  For now, use albuterol HFA, 1 to 2 inhalations 4 to 6 hours if needed.  The patient is to monitor/assess for symptom response.  Chronic  nonallergic rhinitis  Continue azelastine/fluticasone nasal spray, 1 spray per nostril twice daily if needed.  Nasal saline spray (i.e., Simply Saline) or nasal saline lavage (i.e., NeilMed) is recommended as needed and prior to medicated nasal sprays.  For thick post nasal drainage, add guaifenesin [id number] mg (Mucinex)  twice daily as needed with adequate hydration as discussed.  Patient also reports that she is followed by neurology Dr. Carles Collet.  They are managing the essential tremor.  Patient's propranolol dose was just increased.  See last assessment and plan from August/2021 office visit: 09/18/19 - tat  1.  Essential Tremor             -Very limited on options.  We had tried to discontinue her propranolol in the past at the request of Dr. Melvyn Novas, but tremor got much worse.  She went back on it.  She no longer follows with Dr. Melvyn Novas, but now following with Dr. Halford Chessman.                 -she thinks that tremor is getting worse.  Will try to slightly increase propranolol to 40 mg bid.  Will send note to Dr. Halford Chessman and pt to let me know if increased sob.  Addendum: spoke with Dr. Halford Chessman and he is okay with increased dose of propranolol 2.  History of gabapentin induced myoclonus             -Off of gabapentin 3.  Gait instability             -discussed PT             -given RX for rollator 4.  F/u 6-9 months  Patient reporting that she is taking a maintenance inhaler as well as her rescue inhaler.  She is unsure what the name of the maintenance inhaler is.  Her husband confirms that she is on a maintenance inhaler with an inhaled steroid.  They are unsure of the name.  They will follow up with [place] regarding this.  She continues to report a sedentary lifestyle.  They report that in the summer patient does swim during the week.  They are unable to do that when the pool is closed.  She has not walking regularly.  Patient was walked in office today was able to complete 1 lap on room air without any oxygen  desaturations.  Walked at slow pace.  Could not complete second or third lap due to physical deconditioning.   Questionaires / Pulmonary Flowsheets:   ACT:  Asthma Control Test ACT Total Score  08/20/2019 16  11/22/2018 15    MMRC: mMRC Dyspnea Scale mMRC Score  11/25/2019 3  10/18/2019 3    Epworth:  No flowsheet data found.  Tests:   CPST 11/18/19 >> submaximal effort, mild reduction inf functional capacity, ventilatory limitation due to body habitus  Pulmonary testing:   PFT 11/28/16 >> FEV1 1.54 (86%), FEV1% 77, TLC 4.24 (92%), DLCO 71%  PFT 09/05/19 >> FEV1 1.37 (80%), FEV1% 79, TLC 4.36 (94%), DLCO 94%, +BD  Cardiac Tests:   Echo 06/25/19 >> EF 60 to 65%, grade 1 DD, right ventricular systolic function is normal, right ventricular size is normal, normal pulmonary artery systolic pressure  FENO:  No results found for: NITRICOXIDE  PFT: PFT Results Latest Ref Rng & Units 09/04/2019 03/08/2017 11/28/2016  FVC-Pre L 1.52 1.56 1.96  FVC-Predicted Pre % 66 65 82  FVC-Post L 1.72 1.68 1.98  FVC-Predicted Post % 74 70 83  Pre FEV1/FVC % % 80 75 77  Post FEV1/FCV % % 79 79 77  FEV1-Pre L 1.22 1.17 1.52  FEV1-Predicted Pre % 71 66 85  FEV1-Post L 1.37 1.32 1.54  DLCO uncorrected ml/min/mmHg 16.03 - 14.38  DLCO UNC% % 94 - 71  DLCO corrected ml/min/mmHg 16.23 - 15.30  DLCO COR %Predicted % 95 - 75  DLVA Predicted % 113 - 97  TLC L 4.36 - 4.24  TLC % Predicted % 94 - 92  RV % Predicted % 113 - 92    WALK:  SIX MIN WALK 11/25/2019 10/18/2019 10/06/2016  Supplimental Oxygen during Test? (L/min) No No No  Tech Comments: Patient walked slow pace and was only able to complete 1 lap, maintained good sats Patient was able to complete 1 lap at a leisurely pace. Patient has a history of unsteady gait. She was unable to complete the second lap due to her legs feeling weak. No O2 needed during or after her walk. Normal pace/SOB//LMR    Imaging: No results found.  Lab  Results:  CBC    Component Value Date/Time   WBC 6.6 08/27/2019 1048   RBC 4.10 08/27/2019 1048   HGB 13.0 08/27/2019 1048   HCT 39.1 08/27/2019 1048   PLT 349.0 08/27/2019 1048   MCV 95.4 08/27/2019 1048   MCHC 33.2 08/27/2019 1048   RDW 14.6 08/27/2019 1048   LYMPHSABS 1.6 10/06/2016 1218   MONOABS 0.8 10/06/2016 1218   EOSABS 0.2 10/06/2016 1218   BASOSABS 0.1 10/06/2016 1218    BMET    Component Value Date/Time   NA 142 08/27/2019 1048   NA 145 (H)  06/11/2019 1141   K 4.3 08/27/2019 1048   CL 104 08/27/2019 1048   CO2 28 08/27/2019 1048   GLUCOSE 102 (H) 08/27/2019 1048   BUN 12 08/27/2019 1048   BUN 10 06/11/2019 1141   CREATININE 0.79 08/27/2019 1048   CALCIUM 9.4 08/27/2019 1048   GFRNONAA 70 06/11/2019 1141   GFRAA 81 06/11/2019 1141    BNP No results found for: BNP  ProBNP    Component Value Date/Time   PROBNP 636 06/11/2019 1141   PROBNP 310.0 (H) 10/06/2016 1218    Specialty Problems      Pulmonary Problems   Moderate persistent asthma    childhood now returning      Asthma    Allergy profile  10/06/16  >  Eos 0.2 /  IgE  4  RAST neg  Trial off propranolol 10/06/16 > no change  - 10/06/2016  After extensive coaching HFA effectiveness =    75%  - PFT's  11/28/2016  FEV1 1.54 (86 % ) ratio 77  p no % improvement from saba p nothing prior to study with DLCO  71/75  % corrects to 97  % for alv volume with mild exp curvature  - trial of gabapentin 100 tid 11/28/2016 > improved until uri > 01/12/2017 increase to 300 tid  - FENO 02/23/2017  =   Could not do  - Spirometry 02/23/2017  FEV1 1.32 (75%)  Ratio 72   - .flutter valve training 02/23/2017  - Sinus CT 02/24/2017 > Left ethmoid and left maxillary sinus mucosal thickening and fluid suggesting acute sinusitis >rx omnicef 300 mg twice daily x 10 days since amox reaction only a rash - MCT 03/08/17 POS reversible airflow obst > reproduced symptoms  - singuair trial 03/13/2017  - repeat sinus CT  03/14/2017  > 1. Improved left ethmoid sinusitis when compared to 02/24/2017. 2. Unchanged moderate mucosal thickening in the left maxillary Sinus.> rec   shoekaker eval > seen 03/21/17 rec flonase only          Dyspnea on exertion    Echo 06/01/16 Left ventricle: The cavity size was normal. There was mild focal   basal hypertrophy of the septum. Systolic function was normal.   The estimated ejection fraction was in the range of 55% to 60%.   Wall motion was normal; there were no regional wall motion   abnormalities. Doppler parameters are consistent with abnormal   left ventricular relaxation (grade 1 diastolic dysfunction). - Mitral valve: There was trivial regurgitation. - Right atrium: The atrium was mildly dilated.  10/06/2016  Walked RA x 3 laps @ 185 ft each stopped due to  End of study, nl pace, no  desat but sob at end  - trial off propranol and max rx for gerd 10/06/2016 >>> no better - PFT's 11/28/2016 wnl x for min non-specific exp curvature       Chronic nonallergic rhinitis   Cough, persistent   Other forms of dyspnea      Allergies  Allergen Reactions  . Amoxicillin Rash  . Ampicillin Rash  . Penicillins Rash  . Statins Other (See Comments)    Per reports muscle aches and joint pain Per reports muscle aches and joint pain    Immunization History  Administered Date(s) Administered  . Fluad Quad(high Dose 65+) 10/08/2018, 10/25/2019  . Influenza Whole 11/15/2017  . Influenza, High Dose Seasonal PF 11/09/2015, 10/31/2016  . Influenza-Unspecified 09/07/2012, 10/09/2014, 11/03/2016, 10/02/2019  . PFIZER SARS-COV-2 Vaccination 03/16/2019, 04/10/2019,  11/04/2019  . Pneumococcal Conjugate-13 11/09/2015  . Pneumococcal Polysaccharide-23 03/03/2017  . Td 11/09/2015  . Zoster 02/08/2012  . Zoster Recombinat (Shingrix) 07/07/2017, 09/09/2017    Past Medical History:  Diagnosis Date  . Arthritis   . Asthma    childhood now returning  . Back pain affecting pregnancy  11/02/2014  . Benign essential tremor 10/28/2014  . Bilateral dry eyes   . Breast cancer ([place])    right  . Cancer ([facility])    Breast  . Dry eyes   . Encounter for Medicare annual wellness exam 05/22/2016  . Essential hypertension, benign 10/28/2014  . H/O measles   . H/O mumps   . History of chicken pox   . Lumbago 11/02/2014  . Mixed hyperlipidemia 10/28/2014  . Obesity 10/28/2014  . Stenosis of cervical spine     Tobacco History: Social History   Tobacco Use  Smoking Status Never Smoker  Smokeless Tobacco Never Used   Counseling given: Yes   Continue to not smoke  Outpatient Encounter Medications as of 11/25/2019  Medication Sig  . albuterol (PROVENTIL HFA;VENTOLIN HFA) 108 (90 Base) MCG/ACT inhaler INHALE 2 PUFFS INTO THE LUNGS EVERY 4 (FOUR) HOURS AS NEEDED FOR WHEEZING OR SHORTNESS OF BREATH.  Marland Kitchen allopurinol (ZYLOPRIM) 100 MG tablet TAKE 2 TABLETS BY MOUTH EVERY DAY  . AMBULATORY NON FORMULARY MEDICATION Rollator  Dx: Gait instability  . Ascorbic Acid (VITAMIN C PO) Take 1 tablet by mouth daily.   Marland Kitchen aspirin 81 MG tablet Take 81 mg by mouth daily.  . Azelastine-Fluticasone 137-50 MCG/ACT SUSP 1 SPRAY PER NOSTRIL TWICE DAILY FOR RUNNY NOSE  . furosemide (LASIX) 40 MG tablet TAKE 1 TABLET BY MOUTH EVERY DAY  . KLOR-CON M20 20 MEQ tablet TAKE 1 TABLET BY MOUTH EVERY DAY  . levothyroxine (SYNTHROID) 112 MCG tablet Take 1 tablet (112 mcg total) by mouth daily before breakfast.  . montelukast (SINGULAIR) 10 MG tablet TAKE 1 TABLET ONCE AT BEDTIME FOR COUGHING OR WHEEZING.  . Multiple Vitamins-Minerals (ICAPS AREDS 2) CAPS Take 1 capsule by mouth 2 (two) times daily.  . Multiple Vitamins-Minerals (ZINC PO) Take by mouth.  Marland Kitchen PREVACID 30 MG capsule TAKE 1 CAPSULE BY MOUTH TWICE A DAY --MAX LIMIT  . propranolol (INDERAL) 40 MG tablet Take 1 tablet (40 mg total) by mouth 3 (three) times daily.  . Red Yeast Rice Extract (RED YEAST RICE PO) Take by mouth daily.  [name] ER 180 MG 24  hr capsule TAKE 1 CAPSULE BY MOUTH EVERY DAY   No facility-administered encounter medications on file as of 11/25/2019.     Review of Systems  Review of Systems  Constitutional: Negative for activity change, fatigue (baseline ) and fever.  HENT: Negative for sinus pressure, sinus pain and sore throat.   Respiratory: Positive for shortness of breath. Negative for cough, chest tightness and wheezing.   Cardiovascular: Negative for chest pain, palpitations and leg swelling.  Gastrointestinal: Negative for diarrhea, nausea and vomiting.  Musculoskeletal: Negative for arthralgias.  Neurological: Negative for dizziness.  Psychiatric/Behavioral: Negative for sleep disturbance. The patient is not nervous/anxious.      Physical Exam  BP 118/80 (BP Location: Left Arm, Patient Position: Sitting, Cuff Size: Normal)   Pulse (!) 51   Temp (!) 97.3 F (36.3 C) (Temporal)   Ht 5\' 1"  (1.549 m)   Wt 252 lb 3.2 oz (114.4 kg)   SpO2 97%   BMI 47.65 kg/m   Wt Readings from Last 5 Encounters:  11/25/19 252 lb 3.2 oz (114.4 kg)  10/21/19 251 lb (113.9 kg)  10/18/19 250 lb 12.8 oz (113.8 kg)  09/18/19 248 lb (112.5 kg)  08/27/19 253 lb 9.6 oz (115 kg)    BMI Readings from Last 5 Encounters:  11/25/19 47.65 kg/m  10/21/19 47.43 kg/m  10/18/19 45.87 kg/m  09/18/19 45.36 kg/m  08/27/19 47.92 kg/m     Physical Exam Vitals and nursing note reviewed.  Constitutional:      General: She is not in acute distress.    Appearance: Normal appearance. She is obese.  HENT:     Head: Normocephalic and atraumatic.     Right Ear: External ear normal.     Left Ear: External ear normal.     Nose: Nose normal. No congestion.     Mouth/Throat:     Mouth: Mucous membranes are moist.     Pharynx: Oropharynx is clear.  Eyes:     Pupils: Pupils are equal, round, and reactive to light.  Cardiovascular:     Rate and Rhythm: Normal rate and regular rhythm.     Pulses: Normal pulses.     Heart  sounds: Normal heart sounds. No murmur heard.   Pulmonary:     Effort: Pulmonary effort is normal. No respiratory distress.     Breath sounds: Normal breath sounds. No decreased air movement. No decreased breath sounds, wheezing or rales.  Musculoskeletal:     Cervical back: Normal range of motion.  Skin:    General: Skin is warm and dry.     Capillary Refill: Capillary refill takes less than 2 seconds.  Neurological:     General: No focal deficit present.     Mental Status: She is alert and oriented to person, place, and time. Mental status is at baseline.     Gait: Gait (Completed walk in office, completed 1 lap) normal.  Psychiatric:        Mood and Affect: Mood normal.        Behavior: Behavior normal.        Thought Content: Thought content normal.        Judgment: Judgment normal.       Assessment & Plan:   Moderate persistent asthma Plan: Continue rescue inhaler use as needed Patient to send a picture of her inhalers via MyChart so we can update our records Encourage patient to contact Dr. Mariane Masters team to clarify inhaler use  Chronic nonallergic rhinitis Plan: continue Astelin/fluticasone nasal spray Continue nasal saline rinses Keep follow-up with allergy asthma Dr. Verlin Fester   Benign essential tremor Plan: Keep follow-up with neurology Can discuss with neurology if there is other options available for management of essential tremor   Dyspnea on exertion History of cardiac eval which was unrevealing Pulmonary function testing shows air trapping Previous history of inhaler trials without clinical improvement Cardiopulmonary exercise test shows minimal effort.  Likely physical deconditioning Patient morbidly obese BMI 47.6 Patient leads sedentary lifestyle  Plan: I believe dyspnea on exertion is multifactorial  Encourage patient to work on increasing overall physical activity Walk today in office, oxygen level stable on room air patient was able to  complete 1 lap, was not able to complete second or third lap due to physical deconditioning  At risk for obstructive sleep apnea Plan: Home sleep study ordered    Return in about 2 months (around 01/25/2020), or if symptoms worsen or fail to improve, for Follow up with Dr. Halford Chessman.   Lauraine Rinne, NP 11/25/2019  This appointment required 32 minutes of patient care (this includes precharting, chart review, review of results, face-to-face care, etc.).

## 2019-11-25 NOTE — Assessment & Plan Note (Signed)
Plan: continue Astelin/fluticasone nasal spray Continue nasal saline rinses Keep follow-up with allergy asthma Dr. Verlin Fester

## 2019-11-25 NOTE — Telephone Encounter (Signed)
Yes.  This is a patient of Dr. Halford Chessman.  I saw her today in clinic.  I would recommend that she follow-up with allergy asthma Dr. Verlin Fester about whether or not she should be maintained on the Flovent inhaler.  Based off April/2021 office note from Dr. Verlin Fester she was not intended to be maintained on Flovent.Thank you for letting us know.Wyn Quaker, FNP

## 2019-11-26 NOTE — Progress Notes (Signed)
Reviewed and agree with assessment/plan.   Florice Hindle, MD  Pulmonary/Critical Care 11/26/2019, 8:30 AM Pager:  [phone]  

## 2019-11-27 ENCOUNTER — Ambulatory Visit (INDEPENDENT_AMBULATORY_CARE_PROVIDER_SITE_OTHER): Payer: Medicare Other | Admitting: Physical Medicine and Rehabilitation

## 2019-11-27 ENCOUNTER — Other Ambulatory Visit: Payer: Self-pay

## 2019-11-27 ENCOUNTER — Encounter: Payer: Self-pay | Admitting: Physical Medicine and Rehabilitation

## 2019-11-27 DIAGNOSIS — M47816 Spondylosis without myelopathy or radiculopathy, lumbar region: Secondary | ICD-10-CM | POA: Diagnosis not present

## 2019-11-27 DIAGNOSIS — M545 Low back pain, unspecified: Secondary | ICD-10-CM

## 2019-11-27 NOTE — Progress Notes (Signed)
Injection in June helped "very little." States "maybe 5%" relief.  No pain right now. Pain with walking and reaching in low back. Pressure in low back and legs feel weak with walking a short distance. Improves after sitting for a few minutes. Numeric Pain Rating Scale and Functional Assessment Average Pain 7 Pain Right Now 0 My pain is intermittent, dull, aching and pressure Pain is worse with: walking Pain improves with: rest   In the last MONTH (on 0-10 scale) has pain interfered with the following?  1. General activity like being  able to carry out your everyday physical activities such as walking, climbing stairs, carrying groceries, or moving a chair?  Rating(9)  2. Relation with others like being able to carry out your usual social activities and roles such as  activities at home, at work and in your community. Rating(9)  3. Enjoyment of life such that you have  been bothered by emotional problems such as feeling anxious, depressed or irritable?  Rating(9)

## 2019-12-04 DIAGNOSIS — R2681 Unsteadiness on feet: Secondary | ICD-10-CM | POA: Diagnosis not present

## 2019-12-05 ENCOUNTER — Encounter: Payer: Self-pay | Admitting: Physical Medicine and Rehabilitation

## 2019-12-05 ENCOUNTER — Other Ambulatory Visit: Payer: Self-pay

## 2019-12-05 ENCOUNTER — Ambulatory Visit (INDEPENDENT_AMBULATORY_CARE_PROVIDER_SITE_OTHER): Payer: Medicare Other | Admitting: Physical Medicine and Rehabilitation

## 2019-12-05 ENCOUNTER — Ambulatory Visit: Payer: Self-pay

## 2019-12-05 VITALS — BP 127/70 | HR 55

## 2019-12-05 DIAGNOSIS — M47816 Spondylosis without myelopathy or radiculopathy, lumbar region: Secondary | ICD-10-CM

## 2019-12-05 MED ORDER — BETAMETHASONE SOD PHOS & ACET 6 (3-3) MG/ML IJ SUSP
12.0000 mg | Freq: Once | INTRAMUSCULAR | Status: AC
  Administered 2019-12-05: 12 mg

## 2019-12-05 MED ORDER — BUPIVACAINE HCL 0.5 % IJ SOLN
3.0000 mL | Freq: Once | INTRAMUSCULAR | Status: AC
  Administered 2019-12-05: 3 mL

## 2019-12-05 NOTE — Progress Notes (Signed)
Pt state lower back pain. Pt state walking and reaching over makes the pain worse. Pt state she sometimes take pain meds to helps ease the pain.  Numeric Pain Rating Scale and Functional Assessment Average Pain 0   In the last MONTH (on 0-10 scale) has pain interfered with the following?  1. General activity like being  able to carry out your everyday physical activities such as walking, climbing stairs, carrying groceries, or moving a chair?  Rating(8)   +Driver, -BT, -Dye Allergies.

## 2019-12-06 DIAGNOSIS — R2681 Unsteadiness on feet: Secondary | ICD-10-CM | POA: Diagnosis not present

## 2019-12-09 DIAGNOSIS — R2681 Unsteadiness on feet: Secondary | ICD-10-CM | POA: Diagnosis not present

## 2019-12-12 ENCOUNTER — Other Ambulatory Visit: Payer: Self-pay | Admitting: Allergy and Immunology

## 2019-12-12 DIAGNOSIS — J45991 Cough variant asthma: Secondary | ICD-10-CM

## 2019-12-12 DIAGNOSIS — R2681 Unsteadiness on feet: Secondary | ICD-10-CM | POA: Diagnosis not present

## 2019-12-12 NOTE — Telephone Encounter (Signed)
Courtesy refill given for montelukast x 1 with no refills. Left VM message to let her know that she is due for a 6 month follow up.

## 2019-12-16 DIAGNOSIS — R2681 Unsteadiness on feet: Secondary | ICD-10-CM | POA: Diagnosis not present

## 2019-12-18 DIAGNOSIS — R2681 Unsteadiness on feet: Secondary | ICD-10-CM | POA: Diagnosis not present

## 2019-12-23 DIAGNOSIS — L821 Other seborrheic keratosis: Secondary | ICD-10-CM | POA: Diagnosis not present

## 2019-12-23 DIAGNOSIS — L570 Actinic keratosis: Secondary | ICD-10-CM | POA: Diagnosis not present

## 2019-12-23 DIAGNOSIS — L820 Inflamed seborrheic keratosis: Secondary | ICD-10-CM | POA: Diagnosis not present

## 2019-12-24 ENCOUNTER — Encounter: Payer: Self-pay | Admitting: Family Medicine

## 2019-12-24 ENCOUNTER — Other Ambulatory Visit: Payer: Self-pay

## 2019-12-24 ENCOUNTER — Telehealth (INDEPENDENT_AMBULATORY_CARE_PROVIDER_SITE_OTHER): Payer: Medicare Other | Admitting: Family Medicine

## 2019-12-24 DIAGNOSIS — J029 Acute pharyngitis, unspecified: Secondary | ICD-10-CM

## 2019-12-24 MED ORDER — AZITHROMYCIN 250 MG PO TABS: ORAL_TABLET | ORAL | 0 refills | Status: DC

## 2019-12-24 MED ORDER — METHYLPREDNISOLONE 4 MG PO TBPK: ORAL_TABLET | ORAL | 0 refills | Status: DC

## 2019-12-24 NOTE — Progress Notes (Signed)
Chief Complaint  Patient presents with  . Sore Throat  . Cough    Amanda Copeland here for URI complaints. Due to COVID-19 pandemic, we are interacting via web portal for an electronic face-to-face visit. I verified patient's ID using 2 identifiers. Patient agreed to proceed with visit via this method. Patient is at home, I am at office. Patient, husband [name], and I are present for visit.   Duration: a few days  Associated symptoms: itchy watery eyes, sore throat, shortness of breath, chest tightness and cough Denies: sinus congestion, sinus pain, rhinorrhea, ear pain, ear drainage, wheezing, myalgia and fevers Treatment to date: none Sick contacts: No  Past Medical History:  Diagnosis Date  . Arthritis   . Asthma    childhood now returning  . Back pain affecting pregnancy 11/02/2014  . Benign essential tremor 10/28/2014  . Bilateral dry eyes   . Breast cancer ([place])    right  . Cancer ([facility])    Breast  . Dry eyes   . Encounter for Medicare annual wellness exam 05/22/2016  . Essential hypertension, benign 10/28/2014  . H/O measles   . H/O mumps   . History of chicken pox   . Lumbago 11/02/2014  . Mixed hyperlipidemia 10/28/2014  . Obesity 10/28/2014  . Stenosis of cervical spine    Exam No conversational dyspnea Age appropriate judgment and insight Nml affect and mood  Sore throat - Plan: methylPREDNISolone (MEDROL DOSEPAK) 4 MG TBPK tablet, azithromycin (ZITHROMAX) 250 MG tablet  Medrol Dosepak. If no better in 3 d, take Zpak.  Continue to push fluids, practice good hand hygiene, cover mouth when coughing. F/u prn. If starting to experience fevers, shaking, or shortness of breath, seek immediate care. Pt voiced understanding and agreement to the plan.  [place], DO 12/24/19 11:34 AM

## 2019-12-26 DIAGNOSIS — Z03818 Encounter for observation for suspected exposure to other biological agents ruled out: Secondary | ICD-10-CM | POA: Diagnosis not present

## 2019-12-26 DIAGNOSIS — Z20822 Contact with and (suspected) exposure to covid-19: Secondary | ICD-10-CM | POA: Diagnosis not present

## 2020-01-03 ENCOUNTER — Other Ambulatory Visit: Payer: Self-pay | Admitting: Allergy and Immunology

## 2020-01-03 DIAGNOSIS — J45991 Cough variant asthma: Secondary | ICD-10-CM

## 2020-01-05 ENCOUNTER — Encounter: Payer: Self-pay | Admitting: Physical Medicine and Rehabilitation

## 2020-01-05 NOTE — Progress Notes (Signed)
Amanda Copeland - 79 y.o. female MRN [id number]  Date of birth: 02/21/1940  Office Visit Note: Visit Date: 11/27/2019 PCP: [name], MD Referred by: [name], MD  Subjective: Chief Complaint  Patient presents with  . Lower Back - Pain   HPI: Amanda Copeland is a 79 y.o. female who comes in today For continued evaluation and management of worsening severe axial low back pain.  She denies any frank radicular symptoms.  She comes at the request of Dr. Joni Fears.  She was seeing him back in the early part of the year with conservative care of medication management and time and therapy.  She went on to have MRI of the lumbar spine performed and we reviewed all this in prior notes.  She does have significant facet arthropathy at L3-4 and more specifically at L4-5 and milder at L5-S1.  She has moderate multifactorial stenosis at L4-5 with epidural lipomatosis.  Epidural injection in March of this year gave her quite a bit of relief for about 3 months.  She was having more hip and claudication type symptoms at that point.  She rates her current pain as a 7 out of 10 dull aching pain across the lower back worse with standing and walking better at rest.  She does have a positive grocery cart sign.  It does affect her daily living.  She has not responded to medications or therapy.  She depicts a pressure in the low back and she does feel like her legs feel weak at times if she stands and walks but no pain in the legs nothing in the buttocks again no claudication symptoms.  She says the last injection performed in June helped only maybe 5% and she was just putting things off to be seen.  She assumed it was only one type of injection to be performed.  I had talked to her at that time about facet joint mediated pain as well.  Review of Systems  Musculoskeletal: Positive for back pain.  All other systems reviewed and are negative.  Otherwise per HPI.  Assessment & Plan: Visit Diagnoses:  1.  Spondylosis without myelopathy or radiculopathy, lumbar region   2. Acute bilateral low back pain without sciatica   3. Morbid obesity ([place])     Plan: Findings:  Chronic severe axial low back pain some history of pain referral into the hips consistent with lumbar stenosis but that has been better after epidural injection in March of this year.  She does have multifactorial facet arthropathy of the lower spine at L3-4 and L4-5 and L5-S1.  No nerve compression.  No symptoms now of claudication or radiculopathy but continued severe back pain despite medication management and therapy.  We did talk about weight loss and activity.  At this point we will complete diagnostic medial branch blocks at L3-4, L4-5 and L5-S1.  This will be done with a pain diary and if she does well with that would look at a double block paradigm with goal towards radiofrequency ablation of the same nerves to effectively denervated the facet joints at these levels.  Hopefully that will give her more longstanding relief.  If the medial branch blocks are not successful I would try a one-time L4 transforaminal injection.  Otherwise she would need to continue with conservative care versus spine surgery consultation.    Meds & Orders: No orders of the defined types were placed in this encounter.  No orders of the defined types were placed in  this encounter.   Follow-up: Return for Medial branch blocks as described.   Procedures: No procedures performed  No notes on file   Clinical History: MRI LUMBAR SPINE WITHOUT CONTRAST  TECHNIQUE: Multiplanar, multisequence MR imaging of the lumbar spine was performed. No intravenous contrast was administered.  COMPARISON:  Radiography 02/28/2019.  FINDINGS: Segmentation:  5 lumbar type vertebral bodies.  Alignment: 3 mm degenerative anterolisthesis L4-5. 2 mm degenerative anterolisthesis L5-S1.  Vertebrae: Old superior endplate Schmorl's node at T11. No other focal bone  finding.  Conus medullaris and cauda equina: Conus extends to the T12-L1 level. Conus and cauda equina appear normal.  Paraspinal and other soft tissues: Negative  Disc levels:  No significant finding at L1-2 or above.  L2-3: Mild bulging of the disc. Bilateral facet degeneration with joint effusions and mild upper trapeze. Mild narrowing of the lateral recesses but no visible neural compression  L3-4: Mild bulging of the disc. Mild facet and ligamentous hypertrophy. Mild stenosis of the lateral recesses but no visible neural compression.  L4-5: Bilateral facet arthropathy with 3 mm of anterolisthesis. Bulging of the disc. Stenosis of the canal and lateral recesses that could cause neural compression on either or both sides.  L5-S1: Facet osteoarthritis with 2 mm of anterolisthesis. No disc bulge or herniation. No compressive stenosis.  IMPRESSION: L4-5: Bilateral facet arthropathy with 3 mm of anterolisthesis. Bulging of the disc. Stenosis of the canal and lateral recesses that could cause neural compression on either or both sides. The facet arthropathy could certainly contribute to back pain or referred facet syndrome pain.  L3-4: Disc bulge. Facet hypertrophy. Mild stenosis but no visible neural compression.  L2-3: Disc bulge. Facet osteoarthritis with joint effusions. Mild stenosis but no visible neural compression. The facet arthritis could be painful.   Electronically Signed   By: Nelson Chimes M.D.   On: 04/06/2019 04:44   She reports that she has never smoked. She has never used smokeless tobacco.  Recent Labs    02/26/19 1137 08/27/19 1048  HGBA1C 5.5 5.7  LABURIC 5.6  --     Objective:  VS:  HT:    WT:   BMI:     BP:   HR: bpm  TEMP: ( )  RESP:  Physical Exam Constitutional:      General: She is not in acute distress.    Appearance: Normal appearance. She is obese. She is not ill-appearing.  HENT:     Head: Normocephalic and  atraumatic.     Right Ear: External ear normal.     Left Ear: External ear normal.  Eyes:     Extraocular Movements: Extraocular movements intact.  Cardiovascular:     Rate and Rhythm: Normal rate.     Pulses: Normal pulses.  Musculoskeletal:     Right lower leg: No edema.     Left lower leg: No edema.     Comments: Patient has good distal strength with no pain over the greater trochanters.  No clonus or focal weakness. Patient somewhat slow to rise from a seated position to full extension.  There is concordant low back pain with facet loading and lumbar spine extension rotation.  There are no definitive trigger points but the patient is somewhat tender across the lower back and PSIS.  There is no pain with hip rotation.   Skin:    Findings: No erythema, lesion or rash.  Neurological:     General: No focal deficit present.     Mental Status:  She is alert and oriented to person, place, and time.     Sensory: No sensory deficit.     Motor: No weakness or abnormal muscle tone.     Coordination: Coordination normal.  Psychiatric:        Mood and Affect: Mood normal.        Behavior: Behavior normal.     Ortho Exam  Imaging: No results found.  Past Medical/Family/Surgical/Social History: Medications & Allergies reviewed per EMR, new medications updated. Patient Active Problem List   Diagnosis Date Noted  . Physical deconditioning 11/25/2019  . At risk for obstructive sleep apnea 11/25/2019  . Primary osteoarthritis of right shoulder 02/28/2019  . Other forms of dyspnea 11/22/2018  . Educated about COVID-19 virus infection 07/03/2018  . Pain of right hip joint 03/18/2018  . Chronic nonallergic rhinitis 12/20/2017  . Cough, persistent 12/20/2017  . Right flank pain 12/10/2017  . Fatigue 07/12/2017  . Morbid obesity ([place]) 03/05/2017  . Chronic diastolic heart failure ([place]) 11/29/2016  . Dyspnea on exertion 10/06/2016  . Asthma 10/06/2016  . Encounter for Medicare annual  wellness exam 05/22/2016  . Hyperglycemia 08/07/2015  . Allergic 08/07/2015  . Gout 08/07/2015  . Leukocytosis 08/07/2015  . Lower abdominal pain 04/21/2015  . Low back pain 11/02/2014  . Esophageal reflux 10/28/2014  . Essential hypertension, benign 10/28/2014  . Mixed hyperlipidemia 10/28/2014  . Benign essential tremor 10/28/2014  . Bilateral dry eyes   . Moderate persistent asthma   . Arthritis   . Cancer ([place])   . History of chicken pox    Past Medical History:  Diagnosis Date  . Arthritis   . Asthma    childhood now returning  . Back pain affecting pregnancy 11/02/2014  . Benign essential tremor 10/28/2014  . Bilateral dry eyes   . Breast cancer ([place])    right  . Cancer ([facility])    Breast  . Dry eyes   . Encounter for Medicare annual wellness exam 05/22/2016  . Essential hypertension, benign 10/28/2014  . H/O measles   . H/O mumps   . History of chicken pox   . Lumbago 11/02/2014  . Mixed hyperlipidemia 10/28/2014  . Obesity 10/28/2014  . Stenosis of cervical spine    Family History  Problem Relation Age of Onset  . Heart disease Mother   . Heart attack Mother   . Heart failure Mother   . Hypertension Mother   . Cancer Father        colon  . Asthma Father   . Parkinson's disease Father   . Cancer Maternal Grandmother        uterine  . Heart disease Maternal Grandfather   . Obesity Daughter   . Appendicitis Paternal Grandmother   . Stroke Neg Hx    Past Surgical History:  Procedure Laterality Date  . BACK SURGERY    . BREAST REDUCTION SURGERY Left   . CHOLECYSTECTOMY    . EYE SURGERY  2008   b/l cataracts removed, in [place]  . MASTECTOMY Right   . REDUCTION MAMMAPLASTY    . REPLACEMENT TOTAL KNEE BILATERAL Bilateral   . ROTATOR CUFF REPAIR Left    Social History   Occupational History  . 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 Smoking status: Never Smoker  . Smokeless tobacco: Never Used  Vaping Use  . Vaping  Use: Never used  Substance and Sexual Activity  . Alcohol use: Yes  Alcohol/week: 0.0 standard drinks    Comment: two times a month  . Drug use: No  . Sexual activity: Not on file    Comment: lives with husband, moved [place], no dietary restrictions, retired Education officer, [employer]

## 2020-01-09 ENCOUNTER — Telehealth: Payer: Self-pay | Admitting: Pulmonary Disease

## 2020-01-09 NOTE — Telephone Encounter (Signed)
[place] noted. Has appt with Dr. Halford Chessman tomorrow will route to him as Geralynn Rile

## 2020-01-09 NOTE — Telephone Encounter (Signed)
FYI - home sleep study order was paced on 10/18.  I have made several attempts to reach pt.  She left me a vm today that [place] has been out of the country and has decided not to do the hst.

## 2020-01-10 ENCOUNTER — Encounter: Payer: Self-pay | Admitting: Pulmonary Disease

## 2020-01-10 ENCOUNTER — Other Ambulatory Visit: Payer: Self-pay

## 2020-01-10 ENCOUNTER — Ambulatory Visit (INDEPENDENT_AMBULATORY_CARE_PROVIDER_SITE_OTHER): Payer: Medicare Other | Admitting: Pulmonary Disease

## 2020-01-10 VITALS — BP 118/78 | HR 62 | Temp 97.1°F | Ht 61.0 in | Wt 253.4 lb

## 2020-01-10 DIAGNOSIS — G250 Essential tremor: Secondary | ICD-10-CM | POA: Diagnosis not present

## 2020-01-10 DIAGNOSIS — R0600 Dyspnea, unspecified: Secondary | ICD-10-CM

## 2020-01-10 DIAGNOSIS — R0609 Other forms of dyspnea: Secondary | ICD-10-CM

## 2020-01-10 DIAGNOSIS — J310 Chronic rhinitis: Secondary | ICD-10-CM | POA: Diagnosis not present

## 2020-01-10 NOTE — Patient Instructions (Signed)
Follow up as needed

## 2020-01-10 NOTE — Progress Notes (Signed)
[name]  Chief Complaint  Patient presents with  . Follow-up    shortness of breath with exertion, non productive cough    Constitutional:  BP 118/78 (BP Location: Left Wrist)   Pulse 62   Temp (!) 97.1 F (36.2 C) (Other (Comment)) Comment (Src): wrist  Ht 5\' 1"  (1.549 m)   Wt 253 lb 6.4 oz (114.9 kg)   SpO2 96% Comment: Room air  BMI 47.88 kg/m   Past Medical History:  OA, Tremor, Breast cancer Rt, HTN, HLD  Past Surgical History:  Her  has a past surgical history that includes Mastectomy (Right); Back surgery; Rotator cuff repair (Left); Replacement total knee bilateral (Bilateral); Breast reduction surgery (Left); Cholecystectomy; Eye surgery (2008); and Reduction mammaplasty.  Brief Summary:  Amanda Copeland is a 79 y.o. female with dyspnea.      Subjective:   She had CPST.  This showed limitation from body habitus with submaximal effort.  She is not very active at home.  She is okay at rest.  She gets winded with minimal activity.  She recovers after resting a few minutes.  She needs to use a walker, but more so to have a seat to sit and rest when she is walking.  Her tremor has gotten a little worse.  Her Cardiac CT chest was unremarkable for lung disease.  PFT was unremarkable, and no improvement with inhaler therapy.  Physical Exam:   Appearance - well kempt   ENMT - no sinus tenderness, no oral exudate, no LAN, Mallampati 3 airway, no stridor  Respiratory - equal breath sounds bilaterally, no wheezing or rales  CV - s1s2 regular rate and rhythm, no murmurs  Ext - no clubbing, no edema  Skin - no rashes  Psych - normal mood and affect   Pulmonary testing:   PFT 11/28/16 >> FEV1 1.54 (86%), FEV1% 77, TLC 4.24 (92%), DLCO 71%  PFT 09/05/19 >> FEV1 1.37 (80%), FEV1% 79, TLC 4.36 (94%), DLCO 94%, +BD  Chest imaging:   Cardiac CT 03/14/19 >> no significant lung findings, coronary calcium score 26  Cardiac  Tests:   Echo 06/25/19 >> EF 60 to 65%, grade 1 DD  CPST 11/18/19 >> submaximal effort, mild reduction inf functional capacity, ventilatory limitation due to body habitus  Social History:  She  reports that she has never smoked. She has never used smokeless tobacco. She reports current alcohol use. She reports that she does not use drugs.  Family History:  Her family history includes Appendicitis in her paternal grandmother; Asthma in her father; Cancer in her father and maternal grandmother; Heart attack in her mother; Heart disease in her maternal grandfather and mother; Heart failure in her mother; Hypertension in her mother; Obesity in her daughter; Parkinson's disease in her father.     Assessment/Plan:   Dyspnea on exertion. - no significant lung disease - cardiac test has been unrevealing to date; she has follow up with Dr. Irish Lack with [facility] - don't think she needs additional pulmonary testing at this time - main issue seems related obesity with deconditioning - advised her to start a gradual exercise program - she is on propranolol for tremor and this could be impact chronotropic response to exercise and contributing to dyspnea, but she was tried off propranolol previously and her tremor was extremely debilitating - she can return to pulmonary as needed if she develops new symptoms  Snoring. - she would like to defer sleep  testing at this time  Tremor. - followed by Dr. Carles Collet - explained how propranolol could be impacting her exercise tolerance and symptoms of dyspnea, but she feels uncontrolled tremor has tremendously more impact on her quality of life than her breathing issues do  Chronic nonallergic rhinitis. - followed by Dr. Verlin Fester with [facility]  Time Spent Involved in Patient Care on Day of Examination:  16 minutes  Follow up:  Patient Instructions  Follow up as needed   Medication List:   Allergies as of 01/10/2020      Reactions     Amoxicillin Rash   Ampicillin Rash   Penicillins Rash   Statins Other (See Comments)   Per reports muscle aches and joint pain Per reports muscle aches and joint pain      Medication List       Accurate as of January 10, 2020  2:17 PM. If you have any questions, ask your nurse or doctor.        STOP taking these medications   methylPREDNISolone 4 MG Tbpk tablet Commonly known as: MEDROL DOSEPAK Stopped by: Chesley Mires, MD     TAKE these medications   albuterol 108 (90 Base) MCG/ACT inhaler Commonly known as: VENTOLIN HFA INHALE 2 PUFFS INTO THE LUNGS EVERY 4 (FOUR) HOURS AS NEEDED FOR WHEEZING OR SHORTNESS OF BREATH.   allopurinol 100 MG tablet Commonly known as: ZYLOPRIM TAKE 2 TABLETS BY MOUTH EVERY DAY   AMBULATORY NON FORMULARY MEDICATION Rollator  Dx: Gait instability   aspirin 81 MG tablet Take 81 mg by mouth daily.   Azelastine-Fluticasone 137-50 MCG/ACT Susp 1 SPRAY PER NOSTRIL TWICE DAILY FOR RUNNY NOSE   azithromycin 250 MG tablet Commonly known as: ZITHROMAX Take 2 tabs the first day and then 1 tab daily until you run out.   furosemide 40 MG tablet Commonly known as: LASIX TAKE 1 TABLET BY MOUTH EVERY DAY   ICaps Areds 2 Caps Take 1 capsule by mouth 2 (two) times daily.   Klor-Con M20 20 MEQ tablet Generic drug: potassium chloride SA TAKE 1 TABLET BY MOUTH EVERY DAY   levothyroxine 112 MCG tablet Commonly known as: SYNTHROID Take 1 tablet (112 mcg total) by mouth daily before breakfast.   montelukast 10 MG tablet Commonly known as: SINGULAIR TAKE 1 TABLET ONCE AT BEDTIME FOR COUGHING OR WHEEZING.   Prevacid 30 MG capsule Generic drug: lansoprazole TAKE 1 CAPSULE BY MOUTH TWICE A DAY --MAX LIMIT   propranolol 40 MG tablet Commonly known as: INDERAL Take 1 tablet (40 mg total) by mouth 3 (three) times daily.   RED YEAST RICE PO Take by mouth daily.   Tiadylt ER 180 MG 24 hr capsule Generic drug: diltiazem TAKE 1 CAPSULE BY MOUTH  EVERY DAY   VITAMIN C PO Take 1 tablet by mouth daily.   ZINC PO Take by mouth.       Signature:  Chesley Mires, MD [place] Pager - [phone] 01/10/2020, 2:17 PM

## 2020-01-11 ENCOUNTER — Other Ambulatory Visit: Payer: Self-pay | Admitting: Interventional Cardiology

## 2020-01-15 DIAGNOSIS — H35452 Secondary pigmentary degeneration, left eye: Secondary | ICD-10-CM | POA: Diagnosis not present

## 2020-01-15 DIAGNOSIS — H35721 Serous detachment of retinal pigment epithelium, right eye: Secondary | ICD-10-CM | POA: Diagnosis not present

## 2020-01-15 DIAGNOSIS — H353122 Nonexudative age-related macular degeneration, left eye, intermediate dry stage: Secondary | ICD-10-CM | POA: Diagnosis not present

## 2020-01-15 DIAGNOSIS — H35363 Drusen (degenerative) of macula, bilateral: Secondary | ICD-10-CM | POA: Diagnosis not present

## 2020-01-15 DIAGNOSIS — Z961 Presence of intraocular lens: Secondary | ICD-10-CM | POA: Diagnosis not present

## 2020-01-16 ENCOUNTER — Other Ambulatory Visit: Payer: Self-pay | Admitting: Allergy and Immunology

## 2020-01-16 DIAGNOSIS — R2681 Unsteadiness on feet: Secondary | ICD-10-CM | POA: Diagnosis not present

## 2020-01-20 DIAGNOSIS — R2681 Unsteadiness on feet: Secondary | ICD-10-CM | POA: Diagnosis not present

## 2020-01-21 NOTE — Progress Notes (Signed)
Cardiology Office Note   Date:  01/22/2020   ID:  Amanda Copeland, DOB Sep 01, 1940, MRN [id number]  PCP:  [name], MD    No chief complaint on file.  Risk factors for CAD  Wt Readings from Last 3 Encounters:  01/22/20 255 lb (115.7 kg)  01/10/20 253 lb 6.4 oz (114.9 kg)  11/25/19 252 lb 3.2 oz (114.4 kg)       History of Present Illness: Amanda Copeland is a 79 y.o. female  Who has RF for CAD. SHe has had a stress test several years ago that was normal.  She is intolerant to statins to muscle pain and fatigue. SHe took atorvastatin. She also tried another, maybe pravastatin but did not tolerate this.   It was recommended that she follow the DASH diet for weight loss.  In 2018,She saw pulmonary. She was told her lungs are ok.  Lived in [place], then moved to the [place] area.   She never tried the DASH diet.   Chronic SHOB-visit with PMD in 2021:"  PFT was unremarkable, and no improvement with inhaler therapy." She also decline home sleep test.   Coronary CT in 2021 shosed: "Coronary calcium score of 26. This was 30 percentile for age and sex matched control.  2. Normal coronary origin with right dominance.  3. CAD-RADS 1. Minimal non-obstructive CAD (0-24%). Consider non-atherosclerotic causes of chest pain. Consider preventive therapy and risk factor modification."  2021 CPX test: "Conclusion: The interpretation of this test is limited due to submaximal effort during the exercise. Based on available data, exercise testing with gas exchange demonstrates mildly reduced functional capacity when compared to matched sedentary norms.Patient appears with ventilatory limitation related to her body habitus. There is no clear cardiopulmonary limitation. VE/VCO2 slope is elevated and suggestive of increased pulmonary pressures during exercise. There was also chronotropic incompetence most likely related to submaximal effort. "  She has been on a cruise.  Felt  well.   Denies : Chest pain. Dizziness. Leg edema. Nitroglycerin use. Orthopnea. Palpitations. Paroxysmal nocturnal dyspnea. Syncope.   Doing PT for balance.    Past Medical History:  Diagnosis Date  . Arthritis   . Asthma    childhood now returning  . Back pain affecting pregnancy 11/02/2014  . Benign essential tremor 10/28/2014  . Bilateral dry eyes   . Breast cancer ([place])    right  . Cancer ([facility])    Breast  . Dry eyes   . Encounter for Medicare annual wellness exam 05/22/2016  . Essential hypertension, benign 10/28/2014  . H/O measles   . H/O mumps   . History of chicken pox   . Lumbago 11/02/2014  . Mixed hyperlipidemia 10/28/2014  . Obesity 10/28/2014  . Stenosis of cervical spine     Past Surgical History:  Procedure Laterality Date  . BACK SURGERY    . BREAST REDUCTION SURGERY Left   . CHOLECYSTECTOMY    . EYE SURGERY  2008   b/l cataracts removed, in [facility] [employer]  . MASTECTOMY Right   . REDUCTION MAMMAPLASTY    . REPLACEMENT TOTAL KNEE BILATERAL Bilateral   . ROTATOR CUFF REPAIR Left      Current Outpatient Medications  Medication Sig Dispense Refill  . albuterol (PROVENTIL HFA;VENTOLIN HFA) 108 (90 Base) MCG/ACT inhaler INHALE 2 PUFFS INTO THE LUNGS EVERY 4 (FOUR) HOURS AS NEEDED FOR WHEEZING OR SHORTNESS OF BREATH. 6.7 Inhaler 1  . allopurinol (ZYLOPRIM) 100 MG tablet TAKE 2 TABLETS BY MOUTH EVERY  DAY 180 tablet 1  . AMBULATORY NON FORMULARY MEDICATION Rollator  Dx: Gait instability 1 Device 0  . Ascorbic Acid (VITAMIN C PO) Take 1 tablet by mouth daily.     Marland Kitchen aspirin 81 MG tablet Take 81 mg by mouth daily.    Marland Kitchen DYMISTA 137-50 MCG/ACT SUSP 1-2 SPRAYS PER NOSTRIL TWICE DAILY FOR RUNNY NOSE 23 g 5  . furosemide (LASIX) 40 MG tablet TAKE 1 TABLET BY MOUTH EVERY DAY 90 tablet 2  . levothyroxine (SYNTHROID) 112 MCG tablet Take 1 tablet (112 mcg total) by mouth daily before breakfast. 90 tablet 1  . montelukast (SINGULAIR) 10 MG tablet TAKE 1 TABLET ONCE AT BEDTIME  FOR COUGHING OR WHEEZING. 30 tablet 0  . Multiple Vitamins-Minerals (ICAPS AREDS 2) CAPS Take 1 capsule by mouth 2 (two) times daily.    . potassium chloride SA (KLOR-CON M20) 20 MEQ tablet Take 1 tablet (20 mEq total) by mouth daily. Please keep upcoming appt in December with Dr. Irish Lack before anymore refills. Thank you 30 tablet 0  . PREVACID 30 MG capsule TAKE 1 CAPSULE BY MOUTH TWICE A DAY --MAX LIMIT 60 capsule 5  . propranolol (INDERAL) 40 MG tablet Take 1 tablet (40 mg total) by mouth 3 (three) times daily. 180 tablet 2  . Red Yeast Rice Extract (RED YEAST RICE PO) Take by mouth daily.    [name] ER 180 MG 24 hr capsule TAKE 1 CAPSULE BY MOUTH EVERY DAY 90 capsule 1   Current Facility-Administered Medications  Medication Dose Route Frequency Provider Last Rate Last Admin  . betamethasone acetate-betamethasone sodium phosphate (CELESTONE) injection 12 mg  12 mg Other Once Magnus Sinning, MD      . bupivacaine (MARCAINE) 0.5 % (with pres) injection 3 mL  3 mL Other Once Magnus Sinning, MD        Allergies:   Amoxicillin, Ampicillin, Penicillins, and Statins    Social History:  The patient  reports that she has never smoked. She has never used smokeless tobacco. She reports current alcohol use. She reports that she does not use drugs.   Family History:  The patient's family history includes Appendicitis in her paternal grandmother; Asthma in her father; Cancer in her father and maternal grandmother; Heart attack in her mother; Heart disease in her maternal grandfather and mother; Heart failure in her mother; Hypertension in her mother; Obesity in her daughter; Parkinson's disease in her father.    ROS:  Please see the history of present illness.   Otherwise, review of systems are positive for poor balance.   All other systems are reviewed and negative.    PHYSICAL EXAM: VS:  BP 126/66   Pulse 61   Ht 5\' 1"  (1.549 m)   Wt 255 lb (115.7 kg)   SpO2 97%   BMI 48.18 kg/m  , BMI  Body mass index is 48.18 kg/m. GEN: Well nourished, well developed, in no acute distress  HEENT: normal  Neck: no JVD, carotid bruits, or masses Cardiac: RRR; no murmurs, rubs, or gallops,no edema  Respiratory:  clear to auscultation bilaterally, normal work of breathing GI: soft, nontender, nondistended, + BS MS: no deformity or atrophy  Skin: warm and dry, no rash Neuro:  Strength and sensation are intact Psych: euthymic mood, full affect   EKG:   The ekg ordered today demonstrates NSR, no ST changes   Recent Labs: 06/11/2019: NT-Pro BNP 636 08/27/2019: ALT 11; BUN 12; Creatinine, Ser 0.79; Hemoglobin 13.0; Platelets 349.0; Potassium  4.3; Sodium 142; TSH 1.33   Lipid Panel    Component Value Date/Time   CHOL 198 08/27/2019 1048   TRIG 152.0 (H) 08/27/2019 1048   HDL 52.40 08/27/2019 1048   CHOLHDL 4 08/27/2019 1048   VLDL 30.4 08/27/2019 1048   LDLCALC 115 (H) 08/27/2019 1048   LDLDIRECT 115.0 10/27/2016 1145     Other studies Reviewed: Additional studies/ records that were reviewed today with results demonstrating: .   ASSESSMENT AND PLAN:  1. Chronic diastolic heart failure: Continue Lasix 40 mg daily.  Low salt diet. Elevate legs.  2. Hypertensive heart disease: The current medical regimen is effective;  continue present plan and medications. 3. Hyperlipidemia: LDL 115.  Minimal CAD at this time. Healthy diet recommended.  4. DOE: stable.  Likely related to deconditioning and slow HR per CPX test.  Coronary CTA showed only minimal CAD. Increase exercise.  Stationary bike or pool would also be ok.    Current medicines are reviewed at length with the patient today.  The patient concerns regarding her medicines were addressed.  The following changes have been made:  No change  Labs/ tests ordered today include:  No orders of the defined types were placed in this encounter.   Recommend 150 minutes/week of aerobic exercise Low fat, low carb, high fiber diet  recommended  Disposition:   FU in 1 year   Signed, Larae Grooms, MD  01/22/2020 4:40 PM    [facility] [address], [place], [place]  [address] Phone: [phone]; Fax: [phone]

## 2020-01-22 ENCOUNTER — Ambulatory Visit (INDEPENDENT_AMBULATORY_CARE_PROVIDER_SITE_OTHER): Payer: Medicare Other | Admitting: Interventional Cardiology

## 2020-01-22 ENCOUNTER — Encounter: Payer: Self-pay | Admitting: Interventional Cardiology

## 2020-01-22 ENCOUNTER — Other Ambulatory Visit: Payer: Self-pay

## 2020-01-22 VITALS — BP 126/66 | HR 61 | Ht 61.0 in | Wt 255.0 lb

## 2020-01-22 DIAGNOSIS — R0602 Shortness of breath: Secondary | ICD-10-CM | POA: Diagnosis not present

## 2020-01-22 DIAGNOSIS — E782 Mixed hyperlipidemia: Secondary | ICD-10-CM | POA: Diagnosis not present

## 2020-01-22 DIAGNOSIS — I110 Hypertensive heart disease with heart failure: Secondary | ICD-10-CM | POA: Diagnosis not present

## 2020-01-22 DIAGNOSIS — I5032 Chronic diastolic (congestive) heart failure: Secondary | ICD-10-CM | POA: Diagnosis not present

## 2020-01-22 NOTE — Patient Instructions (Signed)
Medication Instructions:  Your physician recommends that you continue on your current medications as directed. Please refer to the Current Medication list given to you today.  *If you need a refill on your cardiac medications before your next appointment, please call your pharmacy*   Lab Work: None  If you have labs (blood work) drawn today and your tests are completely normal, you will receive your results only by: . MyChart Message (if you have MyChart) OR . A paper copy in the mail If you have any lab test that is abnormal or we need to change your treatment, we will call you to review the results.   Testing/Procedures: None  Follow-Up: At [facility], you and your health needs are our priority.  As part of our continuing mission to provide you with exceptional heart care, we have created designated Provider Care Teams.  These Care Teams include your primary Cardiologist (physician) and Advanced Practice Providers (APPs -  Physician Assistants and Nurse Practitioners) who all work together to provide you with the care you need, when you need it.  We recommend signing up for the patient portal called "MyChart".  Sign up information is provided on this After Visit Summary.  MyChart is used to connect with patients for Virtual Visits (Telemedicine).  Patients are able to view lab/test results, encounter notes, upcoming appointments, etc.  Non-urgent messages can be sent to your provider as well.   To learn more about what you can do with MyChart, go to [email or web address].    Your next appointment:   12 month(s)  The format for your next appointment:   In Person  Provider:   You may see Jayadeep Varanasi, MD or one of the following Advanced Practice Providers on your designated Care Team:    Dayna Dunn, PA-C  Michele Lenze, PA-C    Other Instructions  High-Fiber Diet Fiber, also called dietary fiber, is a type of carbohydrate that is found in fruits, vegetables, whole  grains, and beans. A high-fiber diet can have many health benefits. Your health care provider may recommend a high-fiber diet to help:  Prevent constipation. Fiber can make your bowel movements more regular.  Lower your cholesterol.  Relieve the following conditions: ? Swelling of veins in the anus (hemorrhoids). ? Swelling and irritation (inflammation) of specific areas of the digestive tract (uncomplicated diverticulosis). ? A problem of the large intestine (colon) that sometimes causes pain and diarrhea (irritable bowel syndrome, IBS).  Prevent overeating as part of a weight-loss plan.  Prevent heart disease, type 2 diabetes, and certain cancers. What is my plan? The recommended daily fiber intake in grams (g) includes:  38 g for men age 50 or younger.  30 g for men over age 50.  25 g for women age 50 or younger.  21 g for women over age 50. You can get the recommended daily intake of dietary fiber by:  Eating a variety of fruits, vegetables, grains, and beans.  Taking a fiber supplement, if it is not possible to get enough fiber through your diet. What do I need to know about a high-fiber diet?  It is better to get fiber through food sources rather than from fiber supplements. There is not a lot of research about how effective supplements are.  Always check the fiber content on the nutrition facts label of any prepackaged food. Look for foods that contain 5 g of fiber or more per serving.  Talk with a diet and nutrition specialist (dietitian) if you   have questions about specific foods that are recommended or not recommended for your medical condition, especially if those foods are not listed below.  Gradually increase how much fiber you consume. If you increase your intake of dietary fiber too quickly, you may have bloating, cramping, or gas.  Drink plenty of water. Water helps you to digest fiber. What are tips for following this plan?  Eat a wide variety of high-fiber  foods.  Make sure that half of the grains that you eat each day are whole grains.  Eat breads and cereals that are made with whole-grain flour instead of refined flour or white flour.  Eat brown rice, bulgur wheat, or millet instead of white rice.  Start the day with a breakfast that is high in fiber, such as a cereal that contains 5 g of fiber or more per serving.  Use beans in place of meat in soups, salads, and pasta dishes.  Eat high-fiber snacks, such as berries, raw vegetables, nuts, and popcorn.  Choose whole fruits and vegetables instead of processed forms like juice or sauce. What foods can I eat?  Fruits Berries. Pears. Apples. Oranges. Avocado. Prunes and raisins. Dried figs. Vegetables Sweet potatoes. Spinach. Kale. Artichokes. Cabbage. Broccoli. Cauliflower. Green peas. Carrots. Squash. Grains Whole-grain breads. Multigrain cereal. Oats and oatmeal. Brown rice. Barley. Bulgur wheat. Millet. Quinoa. Bran muffins. Popcorn. Rye wafer crackers. Meats and other proteins Navy, kidney, and pinto beans. Soybeans. Split peas. Lentils. Nuts and seeds. Dairy Fiber-fortified yogurt. Beverages Fiber-fortified soy milk. Fiber-fortified orange juice. Other foods Fiber bars. The items listed above may not be a complete list of recommended foods and beverages. Contact a dietitian for more options. What foods are not recommended? Fruits Fruit juice. Cooked, strained fruit. Vegetables Fried potatoes. Canned vegetables. Well-cooked vegetables. Grains White bread. Pasta made with refined flour. White rice. Meats and other proteins Fatty cuts of meat. Fried chicken or fried fish. Dairy Milk. Yogurt. Cream cheese. Sour cream. Fats and oils Butters. Beverages Soft drinks. Other foods Cakes and pastries. The items listed above may not be a complete list of foods and beverages to avoid. Contact a dietitian for more information. Summary  Fiber is a type of carbohydrate. It is  found in fruits, vegetables, whole grains, and beans.  There are many health benefits of eating a high-fiber diet, such as preventing constipation, lowering blood cholesterol, helping with weight loss, and reducing your risk of heart disease, diabetes, and certain cancers.  Gradually increase your intake of fiber. Increasing too fast can result in cramping, bloating, and gas. Drink plenty of water while you increase your fiber.  The best sources of fiber include whole fruits and vegetables, whole grains, nuts, seeds, and beans. This information is not intended to replace advice given to you by your health care provider. Make sure you discuss any questions you have with your health care provider. Document Revised: 11/28/2016 Document Reviewed: 11/28/2016 Elsevier Patient Education  2020 Elsevier Inc.   

## 2020-01-23 DIAGNOSIS — R2681 Unsteadiness on feet: Secondary | ICD-10-CM | POA: Diagnosis not present

## 2020-01-27 DIAGNOSIS — R2681 Unsteadiness on feet: Secondary | ICD-10-CM | POA: Diagnosis not present

## 2020-01-27 NOTE — Procedures (Signed)
Lumbar Diagnostic Facet Joint Nerve Block with Fluoroscopic Guidance   Patient: Amanda Copeland      Date of Birth: December 10, 1940 MRN: [id number] PCP: [name], MD      Visit Date: 12/05/2019   Universal Protocol:    Date/Time: 12/20/216:31 AM  Consent Given By: the patient  Position: PRONE  Additional Comments: Vital signs were monitored before and after the procedure. Patient was prepped and draped in the usual sterile fashion. The correct patient, procedure, and site was verified.   Injection Procedure Details:   Procedure diagnoses:  1. Spondylosis without myelopathy or radiculopathy, lumbar region      Meds Administered:  Meds ordered this encounter  Medications  . bupivacaine (MARCAINE) 0.5 % (with pres) injection 3 mL  . betamethasone acetate-betamethasone sodium phosphate (CELESTONE) injection 12 mg     Laterality: Bilateral  Location/Site: L3-L4, L2 and L3 medial branches, L4-L5, L3 and L4 medial branches and L5-S1, L4 medial branch and L5 dorsal ramus  Needle: 5.0 in., 25 ga.  Short bevel or Quincke spinal needle  Needle Placement: Oblique pedical  Findings:   -Comments: There was excellent flow of contrast along the articular pillars without intravascular flow.  Procedure Details: The fluoroscope beam is vertically oriented in AP and then obliqued 15 to 20 degrees to the ipsilateral side of the desired nerve to achieve the "Scotty dog" appearance.  The skin over the target area of the junction of the superior articulating process and the transverse process (sacral ala if blocking the L5 dorsal rami) was locally anesthetized with a 1 ml volume of 1% Lidocaine without Epinephrine.  The spinal needle was inserted and advanced in a trajectory view down to the target.   After contact with periosteum and negative aspirate for blood and CSF, correct placement without intravascular or epidural spread was confirmed by injecting 0.5 ml. of Isovue-250.  A spot  radiograph was obtained of this image.    Next, a 0.5 ml. volume of the injectate described above was injected. The needle was then redirected to the other facet joint nerves mentioned above if needed.  Prior to the procedure, the patient was given a Pain Diary which was completed for baseline measurements.  After the procedure, the patient rated their pain every 30 minutes and will continue rating at this frequency for a total of 5 hours.  The patient has been asked to complete the Diary and return to [place] by mail, fax or hand delivered as soon as possible.   Additional Comments:  The patient tolerated the procedure well Dressing: 2 x 2 sterile gauze and Band-Aid    Post-procedure details: Patient was observed during the procedure. Post-procedure instructions were reviewed.  Patient left the clinic in stable condition.

## 2020-01-27 NOTE — Progress Notes (Signed)
Amanda Copeland - 79 y.o. female MRN [id number]  Date of birth: [birth year]  Office Visit Note: Visit Date: 12/05/2019 PCP: [name], MD Referred by: [name], MD  Subjective: Chief Complaint  Patient presents with  . Lower Back - Pain   HPI:  Amanda Copeland is a 79 y.o. female who comes in today for planned Bilateral  L3-L4, L4-L5 and L5-S1 Lumbar facet/medial branch block with fluoroscopic guidance.  The patient has failed conservative care including home exercise, medications, time and activity modification.  This injection will be diagnostic and hopefully therapeutic.  Please see requesting physician notes for further details and justification.  Exam has shown concordant pain with facet joint loading.  MRI reviewed with images and spine model.  MRI reviewed in the note below.   ROS Otherwise per HPI.  Assessment & Plan: Visit Diagnoses:    ICD-10-CM   1. Spondylosis without myelopathy or radiculopathy, lumbar region  M47.816 XR C-ARM NO REPORT    Facet Injection    bupivacaine (MARCAINE) 0.5 % (with pres) injection 3 mL    betamethasone acetate-betamethasone sodium phosphate (CELESTONE) injection 12 mg    Plan: No additional findings.   Meds & Orders:  Meds ordered this encounter  Medications  . bupivacaine (MARCAINE) 0.5 % (with pres) injection 3 mL  . betamethasone acetate-betamethasone sodium phosphate (CELESTONE) injection 12 mg    Orders Placed This Encounter  Procedures  . Facet Injection  . XR C-ARM NO REPORT    Follow-up: Return for Review Pain Diary.   Procedures: No procedures performed  Lumbar Diagnostic Facet Joint Nerve Block with Fluoroscopic Guidance   Patient: Amanda Copeland      Date of Birth: 1940-06-16 MRN: [id number] PCP: [name], MD      Visit Date: 12/05/2019   Universal Protocol:    Date/Time: 12/20/216:31 AM  Consent Given By: the patient  Position: PRONE  Additional Comments: Vital signs were monitored before and  after the procedure. Patient was prepped and draped in the usual sterile fashion. The correct patient, procedure, and site was verified.   Injection Procedure Details:   Procedure diagnoses:  1. Spondylosis without myelopathy or radiculopathy, lumbar region      Meds Administered:  Meds ordered this encounter  Medications  . bupivacaine (MARCAINE) 0.5 % (with pres) injection 3 mL  . betamethasone acetate-betamethasone sodium phosphate (CELESTONE) injection 12 mg     Laterality: Bilateral  Location/Site: L3-L4, L2 and L3 medial branches, L4-L5, L3 and L4 medial branches and L5-S1, L4 medial branch and L5 dorsal ramus  Needle: 5.0 in., 25 ga.  Short bevel or Quincke spinal needle  Needle Placement: Oblique pedical  Findings:   -Comments: There was excellent flow of contrast along the articular pillars without intravascular flow.  Procedure Details: The fluoroscope beam is vertically oriented in AP and then obliqued 15 to 20 degrees to the ipsilateral side of the desired nerve to achieve the "Scotty dog" appearance.  The skin over the target area of the junction of the superior articulating process and the transverse process (sacral ala if blocking the L5 dorsal rami) was locally anesthetized with a 1 ml volume of 1% Lidocaine without Epinephrine.  The spinal needle was inserted and advanced in a trajectory view down to the target.   After contact with periosteum and negative aspirate for blood and CSF, correct placement without intravascular or epidural spread was confirmed by injecting 0.5 ml. of Isovue-250.  A spot radiograph was obtained of  this image.    Next, a 0.5 ml. volume of the injectate described above was injected. The needle was then redirected to the other facet joint nerves mentioned above if needed.  Prior to the procedure, the patient was given a Pain Diary which was completed for baseline measurements.  After the procedure, the patient rated their pain every 30  minutes and will continue rating at this frequency for a total of 5 hours.  The patient has been asked to complete the Diary and return to [place] by mail, fax or hand delivered as soon as possible.   Additional Comments:  The patient tolerated the procedure well Dressing: 2 x 2 sterile gauze and Band-Aid    Post-procedure details: Patient was observed during the procedure. Post-procedure instructions were reviewed.  Patient left the clinic in stable condition.    Clinical History: MRI LUMBAR SPINE WITHOUT CONTRAST  TECHNIQUE: Multiplanar, multisequence MR imaging of the lumbar spine was performed. No intravenous contrast was administered.  COMPARISON:  Radiography 02/28/2019.  FINDINGS: Segmentation:  5 lumbar type vertebral bodies.  Alignment: 3 mm degenerative anterolisthesis L4-5. 2 mm degenerative anterolisthesis L5-S1.  Vertebrae: Old superior endplate Schmorl's node at T11. No other focal bone finding.  Conus medullaris and cauda equina: Conus extends to the T12-L1 level. Conus and cauda equina appear normal.  Paraspinal and other soft tissues: Negative  Disc levels:  No significant finding at L1-2 or above.  L2-3: Mild bulging of the disc. Bilateral facet degeneration with joint effusions and mild upper trapeze. Mild narrowing of the lateral recesses but no visible neural compression  L3-4: Mild bulging of the disc. Mild facet and ligamentous hypertrophy. Mild stenosis of the lateral recesses but no visible neural compression.  L4-5: Bilateral facet arthropathy with 3 mm of anterolisthesis. Bulging of the disc. Stenosis of the canal and lateral recesses that could cause neural compression on either or both sides.  L5-S1: Facet osteoarthritis with 2 mm of anterolisthesis. No disc bulge or herniation. No compressive stenosis.  IMPRESSION: L4-5: Bilateral facet arthropathy with 3 mm of anterolisthesis. Bulging of the disc. Stenosis of the canal  and lateral recesses that could cause neural compression on either or both sides. The facet arthropathy could certainly contribute to back pain or referred facet syndrome pain.  L3-4: Disc bulge. Facet hypertrophy. Mild stenosis but no visible neural compression.  L2-3: Disc bulge. Facet osteoarthritis with joint effusions. Mild stenosis but no visible neural compression. The facet arthritis could be painful.   Electronically Signed   By: Nelson Chimes M.D.   On: 04/06/2019 04:44     Objective:  VS:  HT:    WT:   BMI:     BP:127/70  HR:(!) 55bpm  TEMP: ( )  RESP:  Physical Exam Constitutional:      General: She is not in acute distress.    Appearance: Normal appearance. She is obese. She is not ill-appearing.  HENT:     Head: Normocephalic and atraumatic.     Right Ear: External ear normal.     Left Ear: External ear normal.  Eyes:     Extraocular Movements: Extraocular movements intact.  Cardiovascular:     Rate and Rhythm: Normal rate.     Pulses: Normal pulses.  Musculoskeletal:     Right lower leg: No edema.     Left lower leg: No edema.     Comments: Patient has good distal strength with no pain over the greater trochanters.  No clonus or focal  weakness. Patient somewhat slow to rise from a seated position to full extension.  There is concordant low back pain with facet loading and lumbar spine extension rotation.  There are no definitive trigger points but the patient is somewhat tender across the lower back and PSIS.  There is no pain with hip rotation.   Skin:    Findings: No erythema, lesion or rash.  Neurological:     General: No focal deficit present.     Mental Status: She is alert and oriented to person, place, and time.     Sensory: No sensory deficit.     Motor: No weakness or abnormal muscle tone.     Coordination: Coordination normal.  Psychiatric:        Mood and Affect: Mood normal.        Behavior: Behavior normal.      Imaging: No  results found.

## 2020-01-30 ENCOUNTER — Other Ambulatory Visit: Payer: Self-pay | Admitting: Interventional Cardiology

## 2020-02-05 ENCOUNTER — Other Ambulatory Visit: Payer: Self-pay | Admitting: Family Medicine

## 2020-02-05 ENCOUNTER — Other Ambulatory Visit: Payer: Self-pay | Admitting: Interventional Cardiology

## 2020-02-10 DIAGNOSIS — L820 Inflamed seborrheic keratosis: Secondary | ICD-10-CM | POA: Diagnosis not present

## 2020-02-10 DIAGNOSIS — L570 Actinic keratosis: Secondary | ICD-10-CM | POA: Diagnosis not present

## 2020-02-10 DIAGNOSIS — I781 Nevus, non-neoplastic: Secondary | ICD-10-CM | POA: Diagnosis not present

## 2020-02-11 DIAGNOSIS — R2681 Unsteadiness on feet: Secondary | ICD-10-CM | POA: Diagnosis not present

## 2020-02-13 DIAGNOSIS — R2681 Unsteadiness on feet: Secondary | ICD-10-CM | POA: Diagnosis not present

## 2020-02-17 DIAGNOSIS — R2681 Unsteadiness on feet: Secondary | ICD-10-CM | POA: Diagnosis not present

## 2020-02-18 ENCOUNTER — Other Ambulatory Visit: Payer: Self-pay | Admitting: Family Medicine

## 2020-02-20 DIAGNOSIS — R2681 Unsteadiness on feet: Secondary | ICD-10-CM | POA: Diagnosis not present

## 2020-02-24 ENCOUNTER — Ambulatory Visit: Payer: Medicare Other | Admitting: Allergy and Immunology

## 2020-02-26 DIAGNOSIS — R2681 Unsteadiness on feet: Secondary | ICD-10-CM | POA: Diagnosis not present

## 2020-02-27 ENCOUNTER — Ambulatory Visit (INDEPENDENT_AMBULATORY_CARE_PROVIDER_SITE_OTHER): Payer: Medicare Other | Admitting: Family Medicine

## 2020-02-27 ENCOUNTER — Other Ambulatory Visit: Payer: Self-pay

## 2020-02-27 ENCOUNTER — Encounter: Payer: Self-pay | Admitting: Family Medicine

## 2020-02-27 VITALS — BP 130/81 | HR 65 | Temp 97.6°F | Resp 12 | Ht 61.0 in | Wt 250.4 lb

## 2020-02-27 DIAGNOSIS — I5032 Chronic diastolic (congestive) heart failure: Secondary | ICD-10-CM

## 2020-02-27 DIAGNOSIS — R739 Hyperglycemia, unspecified: Secondary | ICD-10-CM

## 2020-02-27 DIAGNOSIS — R251 Tremor, unspecified: Secondary | ICD-10-CM | POA: Diagnosis not present

## 2020-02-27 DIAGNOSIS — R5381 Other malaise: Secondary | ICD-10-CM

## 2020-02-27 DIAGNOSIS — M109 Gout, unspecified: Secondary | ICD-10-CM

## 2020-02-27 DIAGNOSIS — R0600 Dyspnea, unspecified: Secondary | ICD-10-CM

## 2020-02-27 DIAGNOSIS — I10 Essential (primary) hypertension: Secondary | ICD-10-CM

## 2020-02-27 DIAGNOSIS — H04123 Dry eye syndrome of bilateral lacrimal glands: Secondary | ICD-10-CM | POA: Diagnosis not present

## 2020-02-27 DIAGNOSIS — E039 Hypothyroidism, unspecified: Secondary | ICD-10-CM | POA: Diagnosis not present

## 2020-02-27 DIAGNOSIS — E782 Mixed hyperlipidemia: Secondary | ICD-10-CM | POA: Diagnosis not present

## 2020-02-27 DIAGNOSIS — R0609 Other forms of dyspnea: Secondary | ICD-10-CM

## 2020-02-27 LAB — COMPREHENSIVE METABOLIC PANEL
ALT: 9 U/L (ref 0–35)
AST: 14 U/L (ref 0–37)
Albumin: 4.1 g/dL (ref 3.5–5.2)
Alkaline Phosphatase: 104 U/L (ref 39–117)
BUN: 13 mg/dL (ref 6–23)
CO2: 29 mEq/L (ref 19–32)
Calcium: 9.7 mg/dL (ref 8.4–10.5)
Chloride: 104 mEq/L (ref 96–112)
Creatinine, Ser: 0.92 mg/dL (ref 0.40–1.20)
GFR: 58.93 mL/min — ABNORMAL LOW (ref >60.00)
Glucose, Bld: 103 mg/dL — ABNORMAL HIGH (ref 70–99)
Potassium: 4.5 mEq/L (ref 3.5–5.1)
Sodium: 140 mEq/L (ref 135–145)
Total Bilirubin: 0.4 mg/dL (ref 0.2–1.2)
Total Protein: 6.6 g/dL (ref 6.0–8.3)

## 2020-02-27 LAB — CBC
HCT: 40.7 % (ref 36.0–46.0)
Hemoglobin: 13.5 g/dL (ref 12.0–15.0)
MCHC: 33.3 g/dL (ref 30.0–36.0)
MCV: 95.5 fl (ref 78.0–100.0)
Platelets: 313 10*3/uL (ref 150.0–400.0)
RBC: 4.26 Mil/uL (ref 3.87–5.11)
RDW: 14.7 % (ref 11.5–15.5)
WBC: 7 10*3/uL (ref 4.0–10.5)

## 2020-02-27 LAB — LIPID PANEL
Cholesterol: 218 mg/dL — ABNORMAL HIGH (ref 0–200)
HDL: 57.9 mg/dL (ref >39.00)
LDL Cholesterol: 130 mg/dL — ABNORMAL HIGH (ref 0–99)
NonHDL: 159.97
Total CHOL/HDL Ratio: 4
Triglycerides: 152 mg/dL — ABNORMAL HIGH (ref 0.0–149.0)
VLDL: 30.4 mg/dL (ref 0.0–40.0)

## 2020-02-27 LAB — HEMOGLOBIN A1C: Hgb A1c MFr Bld: 5.7 % (ref 4.6–6.5)

## 2020-02-27 LAB — TSH: TSH: 1.76 u[IU]/mL (ref 0.35–4.50)

## 2020-02-27 LAB — URIC ACID: Uric Acid, Serum: 5.9 mg/dL (ref 2.4–7.0)

## 2020-02-27 NOTE — Assessment & Plan Note (Signed)
Very frustrated with her SOB, has had work up with cardiology and pulmonology and they were negative for anything. Likely related to deconditioning and proprnaolol

## 2020-02-27 NOTE — Assessment & Plan Note (Signed)
Hydrate and monitor 

## 2020-02-27 NOTE — Patient Instructions (Signed)
[facility] Onnie Graham shoulders  [facility] Many things can cause shoulder pain, including:  An injury to the shoulder.  Overuse of the shoulder.  Arthritis. The source of the pain can be:  Inflammation.  An injury to the shoulder joint.  An injury to a tendon, ligament, or bone. Follow these instructions at home: Pay attention to changes in your symptoms. Let your health care provider know about them. Follow these instructions to relieve your pain. If you have a sling:  Wear the sling as told by your health care provider. Remove it only as told by your health care provider.  Loosen the sling if your fingers tingle, become numb, or turn cold and blue.  Keep the sling clean.  If the sling is not waterproof: ? Do not let it get wet. Remove it to shower or bathe.  Move your arm as little as possible, but keep your hand moving to prevent swelling. Managing pain, stiffness, and swelling  If directed, put ice on the painful area: ? Put ice in a plastic bag. ? Place a towel between your skin and the bag. ? Leave the ice on for 20 minutes, 2-3 times per day. Stop applying ice if it does not help with the pain.  Squeeze a soft ball or a foam pad as much as possible. This helps to keep the shoulder from swelling. It also helps to strengthen the arm.   General instructions  Take over-the-counter and prescription medicines only as told by your health care provider.  Keep all follow-up visits as told by your health care provider. This is important. Contact a health care provider if:  Your pain gets worse.  Your pain is not relieved with medicines.  New pain develops in your arm, hand, or fingers. Get help right away if:  Your arm, hand, or fingers: ? Tingle. ? Become numb. ? Become swollen. ? Become painful. ? Turn white or blue. Summary  Shoulder pain can be caused by an injury, overuse, or arthritis.  Pay attention to changes in your symptoms.  Let your health care provider know about them.  This condition may be treated with a sling, ice, and pain medicines.  Contact your health care provider if the pain gets worse or new pain develops. Get help right away if your arm, hand, or fingers tingle or become numb, swollen, or painful.  Keep all follow-up visits as told by your health care provider. This is important. This information is not intended to replace advice given to you by your health care provider. Make sure you discuss any questions you have with your health care provider. Document Revised: 08/08/2017 Document Reviewed: 08/08/2017 Elsevier Patient Education  2021 [employer].

## 2020-02-28 ENCOUNTER — Other Ambulatory Visit: Payer: Self-pay | Admitting: *Deleted

## 2020-02-28 ENCOUNTER — Encounter: Payer: Self-pay | Admitting: Allergy and Immunology

## 2020-02-28 ENCOUNTER — Ambulatory Visit (INDEPENDENT_AMBULATORY_CARE_PROVIDER_SITE_OTHER): Payer: Medicare Other | Admitting: Allergy and Immunology

## 2020-02-28 VITALS — BP 130/80 | HR 55 | Temp 98.5°F | Resp 18

## 2020-02-28 DIAGNOSIS — G4720 Circadian rhythm sleep disorder, unspecified type: Secondary | ICD-10-CM

## 2020-02-28 DIAGNOSIS — R0609 Other forms of dyspnea: Secondary | ICD-10-CM

## 2020-02-28 DIAGNOSIS — R0600 Dyspnea, unspecified: Secondary | ICD-10-CM

## 2020-02-28 DIAGNOSIS — J3089 Other allergic rhinitis: Secondary | ICD-10-CM

## 2020-02-28 DIAGNOSIS — E782 Mixed hyperlipidemia: Secondary | ICD-10-CM

## 2020-02-28 DIAGNOSIS — J45991 Cough variant asthma: Secondary | ICD-10-CM | POA: Diagnosis not present

## 2020-02-28 MED ORDER — EZETIMIBE 10 MG PO TABS: 10.0000 mg | ORAL_TABLET | Freq: Every day | ORAL | 2 refills | Status: DC

## 2020-02-28 MED ORDER — DYMISTA 137-50 MCG/ACT NA SUSP: NASAL | 1 refills | Status: DC

## 2020-02-28 MED ORDER — MONTELUKAST SODIUM 10 MG PO TABS: 10.0000 mg | ORAL_TABLET | Freq: Every day | ORAL | 1 refills | Status: DC

## 2020-02-28 NOTE — Patient Instructions (Addendum)
  1.  Can continue Dymista -1 spray each nostril 1-2 times per day  2.  Can continue montelukast 10 mg -1 tablet 1 time per day.  Does this medication help?  3.  Obtain nocturnal oximetry study for sleep dysfunction and dyspnea  4. Obtain CBC w/D, area 2 aeroallergen profile  5.  Can continue inhalers if needed  6.  Return to clinic in 6 months or earlier if problem

## 2020-02-28 NOTE — Progress Notes (Signed)
[facility]   Follow-up Note  Referring Provider: [name], MD Primary Provider: [name], MD Date of Office Visit: 02/28/2020  Subjective:   Amanda Copeland (DOB: [birth year]) is a 80 y.o. female who returns to the [facility] on 02/28/2020 in re-evaluation of the following:  HPI: Amanda Copeland returns to this clinic in reevaluation of dyspnea on exertion, nonallergic rhinitis, and reflux.  Her last visit to this clinic was 24 April 2019.  Amanda Copeland has had extensive evaluation for her dyspnea on exertion including evaluation by pulmonologist and cardiologist with multiple diagnostic tests all of which have not identified a specific cause for her dyspnea on exertion.  Her empiric therapy with anti-inflammatory medications delivered to her respiratory tract have not really helped this issue at all.  She does not believe that the use of a short acting bronchodilator helps her at all.  She does have fractured sleep.  There are many nights where she just does not sleep "well" and is up 3 times per night.  She has never had a sleep study or investigation of nocturnal hypoxemia.  Her nose is doing okay at this point in time while using a combination of Dymista and montelukast.  She is not sure that the montelukast really helps her to any degree.  She has received 3 Pfizer COVID vaccinations and the flu vaccine this year.  Allergies as of 02/28/2020      Reactions   Amoxicillin Rash   Ampicillin Rash   Penicillins Rash   Statins Other (See Comments)   Per reports muscle aches and joint pain Per reports muscle aches and joint pain      Medication List      albuterol 108 (90 Base) MCG/ACT inhaler Commonly known as: VENTOLIN HFA INHALE 2 PUFFS INTO THE LUNGS EVERY 4 (FOUR) HOURS AS NEEDED FOR WHEEZING OR SHORTNESS OF BREATH.   allopurinol 100 MG tablet Commonly known as: ZYLOPRIM TAKE 2 TABLETS BY MOUTH EVERY DAY   AMBULATORY  NON FORMULARY MEDICATION Rollator  Dx: Gait instability   aspirin 81 MG tablet Take 81 mg by mouth daily.   Dymista 137-50 MCG/ACT Susp Generic drug: Azelastine-Fluticasone 1-2 SPRAYS PER NOSTRIL TWICE DAILY FOR RUNNY NOSE   furosemide 40 MG tablet Commonly known as: LASIX TAKE 1 TABLET BY MOUTH EVERY DAY   ICaps Areds 2 Caps Take 1 capsule by mouth 2 (two) times daily.   Klor-Con M20 20 MEQ tablet Generic drug: potassium chloride SA TAKE 1 TABLET BY MOUTH DAILY. KEEP UPCOMING APPOINTMENT   levothyroxine 112 MCG tablet Commonly known as: SYNTHROID Take 1 tablet (112 mcg total) by mouth daily before breakfast.   montelukast 10 MG tablet Commonly known as: SINGULAIR Take 1 tablet (10 mg total) by mouth at bedtime.   Prevacid 30 MG capsule Generic drug: lansoprazole TAKE 1 CAPSULE BY MOUTH TWICE A DAY --MAX LIMIT   propranolol 40 MG tablet Commonly known as: INDERAL Take 1 tablet (40 mg total) by mouth 3 (three) times daily.   RED YEAST RICE PO Take by mouth daily.   Tiadylt ER 180 MG 24 hr capsule Generic drug: diltiazem TAKE 1 CAPSULE BY MOUTH EVERY DAY   VITAMIN C PO Take 1 tablet by mouth daily.       Past Medical History:  Diagnosis Date  . Arthritis   . Asthma    childhood now returning  . Back pain affecting pregnancy 11/02/2014  .  Benign essential tremor 10/28/2014  . Bilateral dry eyes   . Breast cancer ([place])    right  . Cancer ([facility])    Breast  . Dry eyes   . Encounter for Medicare annual wellness exam 05/22/2016  . Essential hypertension, benign 10/28/2014  . H/O measles   . H/O mumps   . History of chicken pox   . Lumbago 11/02/2014  . Mixed hyperlipidemia 10/28/2014  . Obesity 10/28/2014  . Stenosis of cervical spine     Past Surgical History:  Procedure Laterality Date  . BACK SURGERY    . BREAST REDUCTION SURGERY Left   . CHOLECYSTECTOMY    . EYE SURGERY  2008   b/l cataracts removed, in [place]  . MASTECTOMY Right   .  REDUCTION MAMMAPLASTY    . REPLACEMENT TOTAL KNEE BILATERAL Bilateral   . ROTATOR CUFF REPAIR Left     Review of systems negative except as noted in HPI / PMHx or noted below:  Review of Systems  Constitutional: Negative.   HENT: Negative.   Eyes: Negative.   Respiratory: Negative.   Cardiovascular: Negative.   Gastrointestinal: Negative.   Genitourinary: Negative.   Musculoskeletal: Negative.   Skin: Negative.   Neurological: Negative.   Endo/Heme/Allergies: Negative.   Psychiatric/Behavioral: Negative.      Objective:   Vitals:   02/28/20 1026  BP: 130/80  Pulse: (!) 55  Resp: 18  Temp: 98.5 F (36.9 C)  SpO2: 97%          Physical Exam Constitutional:      Appearance: She is not diaphoretic.  HENT:     Head: Normocephalic.     Right Ear: Tympanic membrane, ear canal and external ear normal.     Left Ear: Tympanic membrane, ear canal and external ear normal.     Nose: Nose normal. No mucosal edema or rhinorrhea.     Mouth/Throat:     Mouth: Oropharynx is clear and moist and mucous membranes are normal.     Pharynx: Uvula midline. No oropharyngeal exudate.  Eyes:     Conjunctiva/sclera: Conjunctivae normal.  Neck:     Thyroid: No thyromegaly.     Trachea: Trachea normal. No tracheal tenderness or tracheal deviation.  Cardiovascular:     Rate and Rhythm: Normal rate and regular rhythm.     Heart sounds: Normal heart sounds, S1 normal and S2 normal. No murmur heard.   Pulmonary:     Effort: No respiratory distress.     Breath sounds: Normal breath sounds. No stridor. No wheezing or rales.  Musculoskeletal:        General: No edema.  Lymphadenopathy:     Head:     Right side of head: No tonsillar adenopathy.     Left side of head: No tonsillar adenopathy.     Cervical: No cervical adenopathy.  Skin:    Findings: No erythema or rash.     Nails: There is no clubbing.  Neurological:     Mental Status: She is alert.     Diagnostics:    Results  of an echocardiogram obtained 25 Jun 2019 identified the following:  1. Left ventricular ejection fraction, by estimation, is 60 to 65%. The  left ventricle has normal function. The left ventricle has no regional  wall motion abnormalities. Left ventricular diastolic parameters are  consistent with Grade I diastolic  dysfunction (impaired relaxation). Elevated left ventricular end-diastolic  pressure.  2. Right ventricular systolic function is normal. The right  ventricular  size is normal. There is normal pulmonary artery systolic pressure.  3. The mitral valve is normal in structure. Trivial mitral valve  regurgitation. No evidence of mitral stenosis.  4. The aortic valve is tricuspid. Aortic valve regurgitation is not  visualized. No aortic stenosis is present.  5. The inferior vena cava is normal in size with greater than 50%  respiratory variability, suggesting right atrial pressure of 3 mmHg.   Results of a cardiopulmonary exercise test obtained 18 November 2019 identified the following:  The interpretation of this test is limited due to submaximal effort during the exercise. Based on available data, exercise testing with gas exchange demonstrates mildly reduced functional capacity when compared to matched sedentary norms.Patient appears with ventilatory limitation related to her body habitus. There is no clear cardiopulmonary limitation. VE/VCO2 slope is elevated and suggestive of increased pulmonary pressures during exercise. There was also chronotropic incompetence most likely related to submaximal effort.    Results of a chest CT/coronary angiography study obtained 14 March 2019 identified the following:  Limited view of the lung parenchyma demonstrates no suspicious nodularity. Airways are normal. Limited view of the mediastinum demonstrates no adenopathy. Esophagus normal. Coronary calcium score of 26. This was 44 percentile for age and sex matched control. Normal coronary  origin with right dominance. Minimal non-obstructive CAD (0-24%).  Results of blood tests obtained 27 February 2020 identified WBC 7.0, hemoglobin 13.5, platelet 313  Results of pulmonary function tests obtained 04 September 2019 identified TLC 94%, RV 113%, DL/VA 113%  Assessment and Plan:   1. Dyspnea on exertion   2. Perennial allergic rhinitis   3. Cough variant asthma  vs UACS   4. Dysfunction of sleep stage or arousal     1.  Can continue Dymista -1 spray each nostril 1-2 times per day  2.  Can continue montelukast 10 mg -1 tablet 1 time per day.  Does this medication help?  3.  Obtain nocturnal oximetry study for sleep dysfunction and dyspnea  4. Obtain CBC w/D, area 2 aeroallergen profile  5.  Can continue inhalers if needed  6.  Return to clinic in 6 months or earlier if problem  Amanda Copeland's big issue is dyspnea on exertion.  Because she does have some fractured sleep we will see if she has an issue tied up with nocturnal hypoxemia that may be contributing to some of her daytime dyspnea.  Certainly her cardiopulmonary exercise test did not identify any exercise-induced hypoxemia.  She can continue on Dymista for her upper airway inflammatory condition and she can determine whether or not montelukast is providing her any help by discontinuing this agent transiently.  She has received no benefit from any of the inhalers that have been given to her in the past and at this point it does not appear as though she will be using these inhalers with any consistency.  I will contact her with the results of her nocturnal oximetry study once it is available for review.  Allena Katz, MD [facility]

## 2020-03-01 DIAGNOSIS — R251 Tremor, unspecified: Secondary | ICD-10-CM | POA: Insufficient documentation

## 2020-03-01 DIAGNOSIS — E039 Hypothyroidism, unspecified: Secondary | ICD-10-CM | POA: Insufficient documentation

## 2020-03-01 NOTE — Assessment & Plan Note (Signed)
Is in PT at present. Encouraged to increase activity steadily and slowly to try and improve dyspnea.

## 2020-03-01 NOTE — Assessment & Plan Note (Signed)
Is following with cardiology and thus far they feel her heart is not the main cause of her dyspnea

## 2020-03-01 NOTE — Assessment & Plan Note (Signed)
On Levothyroxine, continue to monitor 

## 2020-03-01 NOTE — Progress Notes (Signed)
Subjective:    Patient ID: Amanda Copeland, female    DOB: 1941/02/05, 80 y.o.   MRN: [id number]  Chief Complaint  Patient presents with  . 6 month follow up    HPI Patient is in today for follow up on chronic medical concerns. No recent febrile illness or hospitalizations. She continues to struggle with shortness of breath and after a work up with pulmonology and cardiology and neither thinks the major contributor is cardiac or pulmonary. They question if propranolol has been the cause. Denies CP/palp/HA/congestion/fevers/GI or GU c/o. Taking meds as prescribed. She is in physical therapy working on her balance  Past Medical History:  Diagnosis Date  . Arthritis   . Asthma    childhood now returning  . Back pain affecting pregnancy 11/02/2014  . Benign essential tremor 10/28/2014  . Bilateral dry eyes   . Breast cancer ([place])    right  . Cancer ([facility])    Breast  . Dry eyes   . Encounter for Medicare annual wellness exam 05/22/2016  . Essential hypertension, benign 10/28/2014  . H/O measles   . H/O mumps   . History of chicken pox   . Lumbago 11/02/2014  . Mixed hyperlipidemia 10/28/2014  . Obesity 10/28/2014  . Stenosis of cervical spine     Past Surgical History:  Procedure Laterality Date  . BACK SURGERY    . BREAST REDUCTION SURGERY Left   . CHOLECYSTECTOMY    . EYE SURGERY  2008   b/l cataracts removed, in [place]  . MASTECTOMY Right   . REDUCTION MAMMAPLASTY    . REPLACEMENT TOTAL KNEE BILATERAL Bilateral   . ROTATOR CUFF REPAIR Left     Family History  Problem Relation Age of Onset  . Heart disease Mother   . Heart attack Mother   . Heart failure Mother   . Hypertension Mother   . Cancer Father        colon  . Asthma Father   . Parkinson's disease Father   . Cancer Maternal Grandmother        uterine  . Heart disease Maternal Grandfather   . Obesity Daughter   . Appendicitis Paternal Grandmother   . Stroke Neg Hx     Social History   Socioeconomic  History  . Marital status: Married    Spouse name: Not on file  . Number of children: 1  . Years of education: Not on file  . Highest education level: Master's degree (e.g., MA, MS, MEng, MEd, MSW, MBA)  Occupational History  . 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 Smoking status: Never Smoker  . Smokeless tobacco: Never Used  Vaping Use  . Vaping Use: Never used  Substance and Sexual Activity  . Alcohol use: Yes    Alcohol/week: 0.0 standard drinks    Comment: two times a month  . Drug use: No  . Sexual activity: Not on file    Comment: lives with husband, moved [place], no dietary restrictions, retired Education officer, museum  Other Topics Concern  . Not on file  Social History Narrative   Pt lives with Spouse at home 1 daughter   Drinks coffee, and soda   Right handed   Social Determinants of Health   Financial Resource Strain: Not on file  Food Insecurity: Not on file  Transportation Needs: Not on file  Physical Activity: Not on file  Stress: Not on file  Social Connections: Not on file  Intimate Partner Violence: Not on file    Outpatient Medications Prior to Visit  Medication Sig Dispense Refill  . albuterol (PROVENTIL HFA;VENTOLIN HFA) 108 (90 Base) MCG/ACT inhaler INHALE 2 PUFFS INTO THE LUNGS EVERY 4 (FOUR) HOURS AS NEEDED FOR WHEEZING OR SHORTNESS OF BREATH. 6.7 Inhaler 1  . allopurinol (ZYLOPRIM) 100 MG tablet TAKE 2 TABLETS BY MOUTH EVERY DAY 180 tablet 1  . AMBULATORY NON FORMULARY MEDICATION Rollator  Dx: Gait instability 1 Device 0  . Ascorbic Acid (VITAMIN C PO) Take 1 tablet by mouth daily.     Marland Kitchen aspirin 81 MG tablet Take 81 mg by mouth daily.    . furosemide (LASIX) 40 MG tablet TAKE 1 TABLET BY MOUTH EVERY DAY 90 tablet 3  . KLOR-CON M20 20 MEQ tablet TAKE 1 TABLET BY MOUTH DAILY. KEEP UPCOMING APPOINTMENT 90 tablet 3  . levothyroxine (SYNTHROID) 112 MCG tablet Take 1 tablet (112 mcg total) by mouth daily before breakfast. 90  tablet 1  . Multiple Vitamins-Minerals (ICAPS AREDS 2) CAPS Take 1 capsule by mouth 2 (two) times daily.    Marland Kitchen PREVACID 30 MG capsule TAKE 1 CAPSULE BY MOUTH TWICE A DAY --MAX LIMIT 180 capsule 1  . propranolol (INDERAL) 40 MG tablet Take 1 tablet (40 mg total) by mouth 3 (three) times daily. 180 tablet 2  . Red Yeast Rice Extract (RED YEAST RICE PO) Take by mouth daily.    [name] ER 180 MG 24 hr capsule TAKE 1 CAPSULE BY MOUTH EVERY DAY 90 capsule 1  . DYMISTA 137-50 MCG/ACT SUSP 1-2 SPRAYS PER NOSTRIL TWICE DAILY FOR RUNNY NOSE 23 g 5  . montelukast (SINGULAIR) 10 MG tablet TAKE 1 TABLET ONCE AT BEDTIME FOR COUGHING OR WHEEZING. 30 tablet 0   No facility-administered medications prior to visit.    Allergies  Allergen Reactions  . Amoxicillin Rash  . Ampicillin Rash  . Penicillins Rash  . Statins Other (See Comments)    Per reports muscle aches and joint pain Per reports muscle aches and joint pain    Review of Systems  Constitutional: Positive for malaise/fatigue. Negative for fever.  HENT: Negative for congestion.   Eyes: Negative for blurred vision.  Respiratory: Positive for shortness of breath.   Cardiovascular: Negative for chest pain, palpitations and leg swelling.  Gastrointestinal: Negative for abdominal pain, blood in stool and nausea.  Genitourinary: Negative for dysuria and frequency.  Musculoskeletal: Positive for joint pain. Negative for falls.  Skin: Negative for rash.  Neurological: Positive for weakness. Negative for dizziness, loss of consciousness and headaches.  Endo/Heme/Allergies: Negative for environmental allergies.  Psychiatric/Behavioral: Negative for depression. The patient is not nervous/anxious.        Objective:    Physical Exam Vitals and nursing note reviewed.  Constitutional:      General: She is not in acute distress.    Appearance: She is well-developed and well-nourished. She is obese.  HENT:     Head: Normocephalic and atraumatic.      Nose: Nose normal.  Eyes:     General:        Right eye: No discharge.        Left eye: No discharge.  Cardiovascular:     Rate and Rhythm: Normal rate and regular rhythm.     Heart sounds: No murmur heard.   Pulmonary:     Effort: Pulmonary effort is normal.     Breath sounds: Normal breath sounds.  Abdominal:     General:  Bowel sounds are normal.     Palpations: Abdomen is soft.     Tenderness: There is no abdominal tenderness.  Musculoskeletal:        General: No edema.     Cervical back: Normal range of motion and neck supple.  Skin:    General: Skin is warm and dry.  Neurological:     Mental Status: She is alert and oriented to person, place, and time.  Psychiatric:        Mood and Affect: Mood and affect normal.     BP 130/81 (BP Location: Left Wrist, Cuff Size: Normal)   Pulse 65   Temp 97.6 F (36.4 C) (Oral)   Resp 12   Ht 5\' 1"  (1.549 m)   Wt 250 lb 6.4 oz (113.6 kg)   SpO2 95%   BMI 47.31 kg/m  Wt Readings from Last 3 Encounters:  02/27/20 250 lb 6.4 oz (113.6 kg)  01/22/20 255 lb (115.7 kg)  01/10/20 253 lb 6.4 oz (114.9 kg)    Diabetic Foot Exam - Simple   No data filed    Lab Results  Component Value Date   WBC 7.0 02/27/2020   HGB 13.5 02/27/2020   HCT 40.7 02/27/2020   PLT 313.0 02/27/2020   GLUCOSE 103 (H) 02/27/2020   CHOL 218 (H) 02/27/2020   TRIG 152.0 (H) 02/27/2020   HDL 57.90 02/27/2020   LDLDIRECT 115.0 10/27/2016   LDLCALC 130 (H) 02/27/2020   ALT 9 02/27/2020   AST 14 02/27/2020   NA 140 02/27/2020   K 4.5 02/27/2020   CL 104 02/27/2020   CREATININE 0.92 02/27/2020   BUN 13 02/27/2020   CO2 29 02/27/2020   TSH 1.76 02/27/2020   HGBA1C 5.7 02/27/2020    Lab Results  Component Value Date   TSH 1.76 02/27/2020   Lab Results  Component Value Date   WBC 7.0 02/27/2020   HGB 13.5 02/27/2020   HCT 40.7 02/27/2020   MCV 95.5 02/27/2020   PLT 313.0 02/27/2020   Lab Results  Component Value Date   NA 140  02/27/2020   K 4.5 02/27/2020   CO2 29 02/27/2020   GLUCOSE 103 (H) 02/27/2020   BUN 13 02/27/2020   CREATININE 0.92 02/27/2020   BILITOT 0.4 02/27/2020   ALKPHOS 104 02/27/2020   AST 14 02/27/2020   ALT 9 02/27/2020   PROT 6.6 02/27/2020   ALBUMIN 4.1 02/27/2020   CALCIUM 9.7 02/27/2020   GFR 58.93 (L) 02/27/2020   Lab Results  Component Value Date   CHOL 218 (H) 02/27/2020   Lab Results  Component Value Date   HDL 57.90 02/27/2020   Lab Results  Component Value Date   LDLCALC 130 (H) 02/27/2020   Lab Results  Component Value Date   TRIG 152.0 (H) 02/27/2020   Lab Results  Component Value Date   CHOLHDL 4 02/27/2020   Lab Results  Component Value Date   HGBA1C 5.7 02/27/2020       Assessment & Plan:   Problem List Items Addressed This Visit    Bilateral dry eyes - Primary   Essential hypertension, benign    Well controlled, no changes to meds. Encouraged heart healthy diet such as the DASH diet and exercise as tolerated.       Relevant Orders   CBC (Completed)   Comprehensive metabolic panel (Completed)   TSH (Completed)   Mixed hyperlipidemia    encouraged heart healthy diet, avoid trans fats, minimize simple  carbs and saturated fats. Increase exercise as tolerated      Relevant Orders   Lipid panel (Completed)   Hyperglycemia   Relevant Orders   Hemoglobin A1c (Completed)   Gout    Hydrate and monitor      Relevant Orders   Uric acid (Completed)   Dyspnea on exertion    Very frustrated with her SOB, has had work up with cardiology and pulmonology and they were negative for anything. Likely related to deconditioning and proprnaolol      Chronic diastolic heart failure ([place])    Is following with cardiology and thus far they feel her heart is not the main cause of her dyspnea      Physical deconditioning    Is in PT at present. Encouraged to increase activity steadily and slowly to try and improve dyspnea.       Tremor    Is well  controlled on Propranolol and patient is struggling with trying to stop it to see if it helps her shortness of Breath but her tremor gets worse so she is not sure what she is going to do.      Hypothyroid    On Levothyroxine, continue to monitor         I am having Arayla Sazama "[name]" maintain her Red Yeast Rice Extract (RED YEAST RICE PO), aspirin, ICaps Areds 2, albuterol, Ascorbic Acid (VITAMIN C PO), allopurinol, levothyroxine, propranolol, AMBULATORY NON FORMULARY MEDICATION, furosemide, Tiadylt ER, Klor-Con M20, and Prevacid.  No orders of the defined types were placed in this encounter.    [name], MD

## 2020-03-01 NOTE — Assessment & Plan Note (Signed)
Is well controlled on Propranolol and patient is struggling with trying to stop it to see if it helps her shortness of Breath but her tremor gets worse so she is not sure what she is going to do.

## 2020-03-01 NOTE — Assessment & Plan Note (Signed)
Well controlled, no changes to meds. Encouraged heart healthy diet such as the DASH diet and exercise as tolerated.  °

## 2020-03-01 NOTE — Assessment & Plan Note (Signed)
encouraged heart healthy diet, avoid trans fats, minimize simple carbs and saturated fats. Increase exercise as tolerated 

## 2020-03-02 ENCOUNTER — Encounter: Payer: Self-pay | Admitting: Allergy and Immunology

## 2020-03-02 ENCOUNTER — Telehealth: Payer: Self-pay

## 2020-03-02 DIAGNOSIS — R2681 Unsteadiness on feet: Secondary | ICD-10-CM | POA: Diagnosis not present

## 2020-03-02 DIAGNOSIS — J4540 Moderate persistent asthma, uncomplicated: Secondary | ICD-10-CM

## 2020-03-02 NOTE — Telephone Encounter (Signed)
Put in an order for cbc with diff. And zone 2. Pt. Just had blood work done at another facility will try to get these tests added on.

## 2020-03-02 NOTE — Telephone Encounter (Signed)
-----   Message from Jiles Prows, MD sent at 03/02/2020  7:09 AM EST ----- Please have [name] obtain a CBC w/ differential, and a Area 2 aeroallergen profile for the diagnosis of asthma.

## 2020-03-02 NOTE — Telephone Encounter (Signed)
Left message for pt. To let her know she will have to get her blood drawn again bc of being 2 different facilities they can't add our orders onto their existing orders.

## 2020-03-03 NOTE — Telephone Encounter (Signed)
Left message for pt. To return my call regarding getting her blood re-drawn

## 2020-03-03 NOTE — Telephone Encounter (Signed)
Pt. Not returning my calls. Will try her again tomorrow. Mailed out lab requisition and map where to get her blood work done.

## 2020-03-04 ENCOUNTER — Other Ambulatory Visit: Payer: Self-pay | Admitting: Family Medicine

## 2020-03-04 DIAGNOSIS — R2681 Unsteadiness on feet: Secondary | ICD-10-CM | POA: Diagnosis not present

## 2020-03-04 NOTE — Telephone Encounter (Signed)
Please refer to last phone call taken by [facility]. Thank you.

## 2020-03-04 NOTE — Telephone Encounter (Signed)
Pt returned call, I let her know that a lab requisition was mailed out and map where to get blood work done. Pt. States she will not get blood work done and she has changed her mind about sleep study and will not be doing that as well.

## 2020-03-08 ENCOUNTER — Other Ambulatory Visit: Payer: Self-pay | Admitting: Allergy and Immunology

## 2020-03-10 DIAGNOSIS — R2681 Unsteadiness on feet: Secondary | ICD-10-CM | POA: Diagnosis not present

## 2020-03-10 NOTE — Telephone Encounter (Signed)
Carrie sent in Augmentin 875 mg per Althea Charon FNP

## 2020-03-11 ENCOUNTER — Ambulatory Visit (INDEPENDENT_AMBULATORY_CARE_PROVIDER_SITE_OTHER): Payer: Medicare Other

## 2020-03-11 ENCOUNTER — Other Ambulatory Visit: Payer: Self-pay

## 2020-03-11 ENCOUNTER — Encounter: Payer: Self-pay | Admitting: Orthopaedic Surgery

## 2020-03-11 ENCOUNTER — Ambulatory Visit (INDEPENDENT_AMBULATORY_CARE_PROVIDER_SITE_OTHER): Payer: Medicare Other | Admitting: Orthopaedic Surgery

## 2020-03-11 VITALS — Ht 61.0 in | Wt 248.0 lb

## 2020-03-11 DIAGNOSIS — M79601 Pain in right arm: Secondary | ICD-10-CM

## 2020-03-11 DIAGNOSIS — M542 Cervicalgia: Secondary | ICD-10-CM | POA: Diagnosis not present

## 2020-03-11 DIAGNOSIS — M19011 Primary osteoarthritis, right shoulder: Secondary | ICD-10-CM

## 2020-03-11 MED ORDER — BUPIVACAINE HCL 0.25 % IJ SOLN
2.0000 mL | INTRAMUSCULAR | Status: AC | PRN
  Administered 2020-03-11: 2 mL via INTRA_ARTICULAR

## 2020-03-11 NOTE — Progress Notes (Signed)
Office Visit Note   Patient: Amanda Copeland           Date of Birth: [birth year]           MRN: [id number] Visit Date: 03/11/2020              Requested by: [name], MD [address] [place],  [place] [address] PCP: [name], MD   Assessment & Plan: Visit Diagnoses:  1. Right arm pain   2. Primary osteoarthritis of right shoulder   3. Neck pain     Plan: Amanda Copeland has been experiencing recurrent symptoms of the osteoarthritis right shoulder with pain and limited motion.  Long discussion regarding diagnosis and treatment options including shoulder replacement.  She preferred to have another intra-articular cortisone injection.  This was performed without difficulty.  There are some degenerative changes about the cervical spine but I think the her symptoms are attributable to her shoulder and not her neck.  Will return as needed  Follow-Up Instructions: Return if symptoms worsen or fail to improve.   Orders:  Orders Placed This Encounter  Procedures  . Large Joint Inj: R glenohumeral  . XR Shoulder Right  . XR Cervical Spine 2 or 3 views   No orders of the defined types were placed in this encounter.     Procedures: Large Joint Inj: R glenohumeral on 03/11/2020 12:06 PM Indications: pain and diagnostic evaluation Details: 25 G 1.5 in needle, posterior approach  Arthrogram: No  Medications: 2 mL bupivacaine 0.25 %  12 mg betamethasone injected with Marcaine into the glenohumeral joint right shoulder Consent was given by the patient. Immediately prior to procedure a time out was called to verify the correct patient, procedure, equipment, support staff and site/side marked as required. Patient was prepped and draped in the usual sterile fashion.       Clinical Data: No additional findings.   Subjective: Chief Complaint  Patient presents with  . Right Shoulder - Pain  Patient presents today for her right shoulder. She was last evaluated  for this here a year ago. She states that her right shoulder continues to be a problem, and has worsened. She has been having anterior shoulder pain that radiates down her arm and into her neck. She states that the pain wakes her at night. She states that her arm can be tender to the touch. No changes in her range of motion with her arm. No numbness or tingling. She takes Tylenol daily. She is right hand dominant. She states that since her last visit here she saw a sports medicine doctor closer to where she lives. She received a cortisone injection but only got relief for a few days. She cannot remember when she got it, but states it was months ago.  HPI  Review of Systems   Objective: Vital Signs: Ht 5\' 1"  (1.549 m)   Wt 248 lb (112.5 kg)   BMI 46.86 kg/m   Physical Exam Constitutional:      Appearance: She is well-developed and well-nourished.  HENT:     Mouth/Throat:     Mouth: Oropharynx is clear and moist.  Eyes:     Extraocular Movements: EOM normal.     Pupils: Pupils are equal, round, and reactive to light.  Pulmonary:     Effort: Pulmonary effort is normal.  Skin:    General: Skin is warm and dry.  Neurological:     Mental Status: She is alert  and oriented to person, place, and time.  Psychiatric:        Mood and Affect: Mood and affect normal.        Behavior: Behavior normal.     Ortho Exam right shoulder with limitation of motion.  Is able to flex about 120 to 125 degrees at which point she had difficulty going any further.  There was some very mild crepitation.  Had about 60 degrees 70 degrees of internal rotation and only about 20 degrees of external rotation at which point there was pain.  Large arms.  Good grip and good release.  BMI 47  Specialty Comments:  No specialty comments available.  Imaging: XR Cervical Spine 2 or 3 views  Result Date: 03/11/2020 Films of the cervical spine obtained in 2 projections.  There is degenerative disc disease between C4-5  and C5-6.  There is some mild compression of the C5 vertebral body consistent with arthritis with peripheral osteophytes anteriorly. moderate straightening of the normal cervical lordosis  XR Shoulder Right  Result Date: 03/11/2020 Films of the right shoulder obtained in several projections.  Compared to her prior films there is little change in the osteoarthritis of the glenohumeral joint.  There is a large inferior humeral head spur and narrowing of the glenohumeral joint space.  Appears to have a normal space between the humeral head and the acromium.  Humeral head is centered about the glenoid.  There are some degenerative changes at the [facility] joint and the sloped lateral acromium.  No acute changes    PMFS History: Patient Active Problem List   Diagnosis Date Noted  . Neck pain 03/11/2020  . Tremor 03/01/2020  . Hypothyroid 03/01/2020  . Physical deconditioning 11/25/2019  . At risk for obstructive sleep apnea 11/25/2019  . Primary osteoarthritis of right shoulder 02/28/2019  . Other forms of dyspnea 11/22/2018  . Educated about COVID-19 virus infection 07/03/2018  . Pain of right hip joint 03/18/2018  . Chronic nonallergic rhinitis 12/20/2017  . Cough, persistent 12/20/2017  . Right flank pain 12/10/2017  . Fatigue 07/12/2017  . Morbid obesity ([place]) 03/05/2017  . Chronic diastolic heart failure ([place]) 11/29/2016  . Dyspnea on exertion 10/06/2016  . Asthma 10/06/2016  . Encounter for Medicare annual wellness exam 05/22/2016  . Hyperglycemia 08/07/2015  . Allergic 08/07/2015  . Gout 08/07/2015  . Leukocytosis 08/07/2015  . Lower abdominal pain 04/21/2015  . Low back pain 11/02/2014  . Esophageal reflux 10/28/2014  . Essential hypertension, benign 10/28/2014  . Mixed hyperlipidemia 10/28/2014  . Benign essential tremor 10/28/2014  . Bilateral dry eyes   . Moderate persistent asthma   . Arthritis   . Cancer ([place])   . History of chicken pox    Past Medical History:   Diagnosis Date  . Arthritis   . Asthma    childhood now returning  . Back pain affecting pregnancy 11/02/2014  . Benign essential tremor 10/28/2014  . Bilateral dry eyes   . Breast cancer ([place])    right  . Cancer ([facility])    Breast  . Dry eyes   . Encounter for Medicare annual wellness exam 05/22/2016  . Essential hypertension, benign 10/28/2014  . H/O measles   . H/O mumps   . History of chicken pox   . Lumbago 11/02/2014  . Mixed hyperlipidemia 10/28/2014  . Obesity 10/28/2014  . Stenosis of cervical spine     Family History  Problem Relation Age of Onset  . Heart disease Mother   .  Heart attack Mother   . Heart failure Mother   . Hypertension Mother   . Cancer Father        colon  . Asthma Father   . Parkinson's disease Father   . Cancer Maternal Grandmother        uterine  . Heart disease Maternal Grandfather   . Obesity Daughter   . Appendicitis Paternal Grandmother   . Stroke Neg Hx     Past Surgical History:  Procedure Laterality Date  . BACK SURGERY    . BREAST REDUCTION SURGERY Left   . CHOLECYSTECTOMY    . EYE SURGERY  2008   b/l cataracts removed, in [place]  . MASTECTOMY Right   . REDUCTION MAMMAPLASTY    . REPLACEMENT TOTAL KNEE BILATERAL Bilateral   . ROTATOR CUFF REPAIR Left    Social History   Occupational History  . 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 Smoking status: Never Smoker  . Smokeless tobacco: Never Used  Vaping Use  . Vaping Use: Never used  Substance and Sexual Activity  . Alcohol use: Yes    Alcohol/week: 0.0 standard drinks    Comment: two times a month  . Drug use: No  . Sexual activity: Not on file    Comment: lives with husband, moved [place], no dietary restrictions, retired Education officer, [employer]

## 2020-03-13 ENCOUNTER — Other Ambulatory Visit: Payer: Self-pay | Admitting: Neurology

## 2020-03-13 NOTE — Telephone Encounter (Signed)
Rx(s) sent to pharmacy electronically.  

## 2020-03-16 DIAGNOSIS — R2681 Unsteadiness on feet: Secondary | ICD-10-CM | POA: Diagnosis not present

## 2020-03-19 DIAGNOSIS — R2681 Unsteadiness on feet: Secondary | ICD-10-CM | POA: Diagnosis not present

## 2020-03-24 DIAGNOSIS — R2681 Unsteadiness on feet: Secondary | ICD-10-CM | POA: Diagnosis not present

## 2020-03-27 ENCOUNTER — Other Ambulatory Visit: Payer: Self-pay

## 2020-03-27 ENCOUNTER — Encounter: Payer: Self-pay | Admitting: Family Medicine

## 2020-03-27 ENCOUNTER — Other Ambulatory Visit: Payer: Self-pay | Admitting: Family Medicine

## 2020-03-27 MED ORDER — ALLOPURINOL 100 MG PO TABS
200.0000 mg | ORAL_TABLET | Freq: Every day | ORAL | 1 refills | Status: DC
Start: 2020-03-27 — End: 2020-03-27

## 2020-03-27 MED ORDER — ALLOPURINOL 100 MG PO TABS
200.0000 mg | ORAL_TABLET | Freq: Every day | ORAL | 1 refills | Status: DC
Start: 2020-03-27 — End: 2021-04-05

## 2020-03-27 MED ORDER — COLCHICINE 0.6 MG PO TABS
ORAL_TABLET | ORAL | 1 refills | Status: DC
Start: 2020-03-27 — End: 2020-03-30

## 2020-03-30 ENCOUNTER — Encounter: Payer: Self-pay | Admitting: Family Medicine

## 2020-03-30 MED ORDER — COLCHICINE 0.6 MG PO TABS: ORAL_TABLET | ORAL | 1 refills | Status: DC

## 2020-04-03 DIAGNOSIS — R2681 Unsteadiness on feet: Secondary | ICD-10-CM | POA: Diagnosis not present

## 2020-04-06 DIAGNOSIS — R2681 Unsteadiness on feet: Secondary | ICD-10-CM | POA: Diagnosis not present

## 2020-04-09 DIAGNOSIS — R2681 Unsteadiness on feet: Secondary | ICD-10-CM | POA: Diagnosis not present

## 2020-04-13 DIAGNOSIS — R2681 Unsteadiness on feet: Secondary | ICD-10-CM | POA: Diagnosis not present

## 2020-04-15 DIAGNOSIS — H35363 Drusen (degenerative) of macula, bilateral: Secondary | ICD-10-CM | POA: Diagnosis not present

## 2020-04-15 DIAGNOSIS — H43813 Vitreous degeneration, bilateral: Secondary | ICD-10-CM | POA: Diagnosis not present

## 2020-04-15 DIAGNOSIS — Z961 Presence of intraocular lens: Secondary | ICD-10-CM | POA: Diagnosis not present

## 2020-04-15 DIAGNOSIS — H353112 Nonexudative age-related macular degeneration, right eye, intermediate dry stage: Secondary | ICD-10-CM | POA: Diagnosis not present

## 2020-04-15 DIAGNOSIS — H35453 Secondary pigmentary degeneration, bilateral: Secondary | ICD-10-CM | POA: Diagnosis not present

## 2020-04-15 DIAGNOSIS — H353121 Nonexudative age-related macular degeneration, left eye, early dry stage: Secondary | ICD-10-CM | POA: Diagnosis not present

## 2020-05-06 DIAGNOSIS — H353112 Nonexudative age-related macular degeneration, right eye, intermediate dry stage: Secondary | ICD-10-CM | POA: Diagnosis not present

## 2020-05-06 DIAGNOSIS — H04123 Dry eye syndrome of bilateral lacrimal glands: Secondary | ICD-10-CM | POA: Diagnosis not present

## 2020-05-06 DIAGNOSIS — H353121 Nonexudative age-related macular degeneration, left eye, early dry stage: Secondary | ICD-10-CM | POA: Diagnosis not present

## 2020-05-06 DIAGNOSIS — G245 Blepharospasm: Secondary | ICD-10-CM | POA: Diagnosis not present

## 2020-05-06 DIAGNOSIS — H52223 Regular astigmatism, bilateral: Secondary | ICD-10-CM | POA: Diagnosis not present

## 2020-05-06 DIAGNOSIS — H43813 Vitreous degeneration, bilateral: Secondary | ICD-10-CM | POA: Diagnosis not present

## 2020-05-06 DIAGNOSIS — H524 Presbyopia: Secondary | ICD-10-CM | POA: Diagnosis not present

## 2020-05-06 DIAGNOSIS — Z961 Presence of intraocular lens: Secondary | ICD-10-CM | POA: Diagnosis not present

## 2020-05-18 DIAGNOSIS — R059 Cough, unspecified: Secondary | ICD-10-CM | POA: Diagnosis not present

## 2020-05-18 DIAGNOSIS — R0602 Shortness of breath: Secondary | ICD-10-CM | POA: Diagnosis not present

## 2020-05-18 DIAGNOSIS — J40 Bronchitis, not specified as acute or chronic: Secondary | ICD-10-CM | POA: Diagnosis not present

## 2020-05-25 ENCOUNTER — Other Ambulatory Visit: Payer: Self-pay | Admitting: Family Medicine

## 2020-05-25 DIAGNOSIS — E782 Mixed hyperlipidemia: Secondary | ICD-10-CM

## 2020-05-29 ENCOUNTER — Other Ambulatory Visit: Payer: Self-pay

## 2020-05-29 ENCOUNTER — Other Ambulatory Visit (INDEPENDENT_AMBULATORY_CARE_PROVIDER_SITE_OTHER): Payer: Medicare Other

## 2020-05-29 DIAGNOSIS — E782 Mixed hyperlipidemia: Secondary | ICD-10-CM | POA: Diagnosis not present

## 2020-05-29 LAB — LIPID PANEL
Cholesterol: 158 mg/dL (ref 0–200)
HDL: 55.6 mg/dL (ref >39.00)
LDL Cholesterol: 76 mg/dL (ref 0–99)
NonHDL: 102.12
Total CHOL/HDL Ratio: 3
Triglycerides: 132 mg/dL (ref 0.0–149.0)
VLDL: 26.4 mg/dL (ref 0.0–40.0)

## 2020-06-15 NOTE — Progress Notes (Signed)
Assessment/Plan:    1.  Essential Tremor  -Very limited on options, as we have discussed before.  She has already been on first and second line medications.  She is not a candidate for surgical interventions.  I have taken her off of her propranolol several months ago, and she still complains about shortness of breath, so I do not think that was the etiology.  I also told the patient that unfortunately, I really have no other options for her tremor.  Patient really asks if she can go back on something she was on previously.  I told her I do not think that would be very helpful, but ultimately she wants to retry primidone.  Discussed with her that she felt it made her sleepy in the past.  We will just restarted at 50 mg at bedtime.  She is to let me know how she does.  She understands that if this does not work or she does not tolerate it, there really are not other options unfortunately. 2.  History of gabapentin induced myoclonus  -Off of gabapentin 3.  Gait instability  -discussed PT  -given RX for rollator 4.  F/u 6-9 months Subjective:   Amanda Copeland was seen today in follow up for essential tremor.  My previous records were reviewed prior to todays visit.  We increased her propranolol last visit after talking with Dr. Halford Chessman and getting his blessing about it.  She was on 40 mg twice per day.    She saw the nurse practitioner from pulmonary not long thereafter.  Those notes are reviewed.  He told her that she could discuss other tremor management medications with me, if available.  She emailed me in January stating that she was having problems with shortness of breath and was told that potentially the propranolol was the problem.  I recommend she go ahead and stop the propranolol, but I also told her that we likely had no other options for her unfortunately.  I do not see that she has followed up with pulmonary since that time.  She reports that she is still SOB but she doesn't want to go back  on the propranolol because she thinks it caused insomnia.  "I miss my propranolol because it worked."  Current prescribed movement disorder medications: None (she stopped her propranolol)   PREVIOUS MEDICATIONS:  propranolol (? Worsen asthma but pt felt worsened her insomnia); metoprolol (didn't help); primidone (made too sleepy and didn't help at 100 mg daily); topamax (not helpful and SE); gabapentin (myoclonus)  ALLERGIES:   Allergies  Allergen Reactions  . Amoxicillin Rash  . Ampicillin Rash  . Penicillins Rash  . Statins Other (See Comments)    Per reports muscle aches and joint pain Per reports muscle aches and joint pain    CURRENT MEDICATIONS:  Outpatient Encounter Medications as of 06/17/2020  Medication Sig  . albuterol (PROVENTIL HFA;VENTOLIN HFA) 108 (90 Base) MCG/ACT inhaler INHALE 2 PUFFS INTO THE LUNGS EVERY 4 (FOUR) HOURS AS NEEDED FOR WHEEZING OR SHORTNESS OF BREATH.  Marland Kitchen allopurinol (ZYLOPRIM) 100 MG tablet Take 2 tablets (200 mg total) by mouth daily.  . AMBULATORY NON FORMULARY MEDICATION Rollator  Dx: Gait instability  . Ascorbic Acid (VITAMIN C PO) Take 1 tablet by mouth daily.   Marland Kitchen aspirin 81 MG tablet Take 81 mg by mouth daily.  Marland Kitchen DYMISTA 137-50 MCG/ACT SUSP 1-2 SPRAYS PER NOSTRIL TWICE DAILY FOR RUNNY NOSE  . ezetimibe (ZETIA) 10 MG tablet TAKE 1  TABLET BY MOUTH EVERY DAY  . fluticasone (FLOVENT HFA) 110 MCG/ACT inhaler 2 PUFFS TWICE DAILY TO PREVENT COUGHING OR WHEEZING. MAX INS WILL COVER IS A 30 DAY  . furosemide (LASIX) 40 MG tablet TAKE 1 TABLET BY MOUTH EVERY DAY  . levothyroxine (SYNTHROID) 112 MCG tablet Take 1 tablet (112 mcg total) by mouth daily before breakfast.  . montelukast (SINGULAIR) 10 MG tablet Take 1 tablet (10 mg total) by mouth at bedtime.  . Multiple Vitamins-Minerals (ICAPS AREDS 2) CAPS Take 1 capsule by mouth 2 (two) times daily.  Marland Kitchen PREVACID 30 MG capsule TAKE 1 CAPSULE BY MOUTH TWICE A DAY --MAX LIMIT  . Red Yeast Rice Extract (RED  YEAST RICE PO) Take by mouth daily.  [name] ER 180 MG 24 hr capsule TAKE 1 CAPSULE BY MOUTH EVERY DAY  . colchicine 0.6 MG tablet Take 2 tabs at first and then 1 tab q 2 hours til pain relief or max of 6 tabs in 24 hours (Patient not taking: Reported on 06/17/2020)  . KLOR-CON M20 20 MEQ tablet TAKE 1 TABLET BY MOUTH DAILY. KEEP UPCOMING APPOINTMENT (Patient not taking: Reported on 06/17/2020)  . propranolol (INDERAL) 40 MG tablet TAKE 1 TABLET (40 MG TOTAL) BY MOUTH 3 (THREE) TIMES DAILY. (Patient not taking: Reported on 06/17/2020)   No facility-administered encounter medications on file as of 06/17/2020.     Objective:    PHYSICAL EXAMINATION:    VITALS:   Vitals:   06/17/20 1055  BP: 122/76  Pulse: 90  SpO2: 97%  Weight: 243 lb (110.2 kg)  Height: 5\' 1"  (1.549 m)    GEN:  The patient appears stated age and is in NAD. HEENT:  Normocephalic, atraumatic.  The mucous membranes are moist. The superficial temporal arteries are without ropiness or tenderness. CV:  RRR Lungs:  CTAB Neck/HEME:  There are no carotid bruits bilaterally.  Neurological examination:  Orientation: The patient is alert and oriented x3. Cranial nerves: There is good facial symmetry. The speech is fluent and clear. Soft palate rises symmetrically and there is no tongue deviation. Hearing is intact to conversational tone. Sensation: Sensation is intact to light touch throughout Motor: Strength is at least antigravity x4.  Movement examination: Tone: There is normal tone in the UE/LE Abnormal movements: There is no rest tremor.  There is mild postural tremor, right greater than left.  It is worse when she holds her weight.   Coordination:  There is no decremation with RAM's Gait and Station: The patient pushes off of the chair.  She is slightly unsteady, wide based. I have reviewed and interpreted the following labs independently   Chemistry      Component Value Date/Time   NA 140 02/27/2020 0939   NA  145 (H) 06/11/2019 1141   K 4.5 02/27/2020 0939   CL 104 02/27/2020 0939   CO2 29 02/27/2020 0939   BUN 13 02/27/2020 0939   BUN 10 06/11/2019 1141   CREATININE 0.92 02/27/2020 0939      Component Value Date/Time   CALCIUM 9.7 02/27/2020 0939   ALKPHOS 104 02/27/2020 0939   AST 14 02/27/2020 0939   ALT 9 02/27/2020 0939   BILITOT 0.4 02/27/2020 0939      Lab Results  Component Value Date   WBC 7.0 02/27/2020   HGB 13.5 02/27/2020   HCT 40.7 02/27/2020   MCV 95.5 02/27/2020   PLT 313.0 02/27/2020   Lab Results  Component Value Date   TSH  1.76 02/27/2020     Chemistry      Component Value Date/Time   NA 140 02/27/2020 0939   NA 145 (H) 06/11/2019 1141   K 4.5 02/27/2020 0939   CL 104 02/27/2020 0939   CO2 29 02/27/2020 0939   BUN 13 02/27/2020 0939   BUN 10 06/11/2019 1141   CREATININE 0.92 02/27/2020 0939      Component Value Date/Time   CALCIUM 9.7 02/27/2020 0939   ALKPHOS 104 02/27/2020 0939   AST 14 02/27/2020 0939   ALT 9 02/27/2020 0939   BILITOT 0.4 02/27/2020 0939         Total time spent on today's visit was 20 minutes, including both face-to-face time and nonface-to-face time.  Time included that spent on review of records (prior notes available to me/labs/imaging if pertinent), discussing treatment and goals, answering patient's questions and coordinating care.  Cc:  [name], MD

## 2020-06-17 ENCOUNTER — Ambulatory Visit (INDEPENDENT_AMBULATORY_CARE_PROVIDER_SITE_OTHER): Payer: Medicare Other | Admitting: Neurology

## 2020-06-17 ENCOUNTER — Other Ambulatory Visit: Payer: Self-pay

## 2020-06-17 ENCOUNTER — Encounter: Payer: Self-pay | Admitting: Neurology

## 2020-06-17 VITALS — BP 122/76 | HR 90 | Ht 61.0 in | Wt 243.0 lb

## 2020-06-17 DIAGNOSIS — G250 Essential tremor: Secondary | ICD-10-CM | POA: Diagnosis not present

## 2020-06-17 DIAGNOSIS — R0602 Shortness of breath: Secondary | ICD-10-CM | POA: Diagnosis not present

## 2020-06-17 MED ORDER — PRIMIDONE 50 MG PO TABS: 50.0000 mg | ORAL_TABLET | Freq: Every day | ORAL | 1 refills | Status: DC

## 2020-06-17 NOTE — Patient Instructions (Signed)
Start primidone 50 mg - 1/2 tablet at bedtime for 1 week and then increase to 1 tablet at bedtime thereafter.  Let me know how you do with this and if you tolerate it, without being too sleepy.

## 2020-07-23 ENCOUNTER — Encounter: Payer: Self-pay | Admitting: Adult Health

## 2020-07-23 ENCOUNTER — Other Ambulatory Visit: Payer: Self-pay

## 2020-07-23 ENCOUNTER — Ambulatory Visit (INDEPENDENT_AMBULATORY_CARE_PROVIDER_SITE_OTHER): Payer: Medicare Other | Admitting: Adult Health

## 2020-07-23 DIAGNOSIS — R0609 Other forms of dyspnea: Secondary | ICD-10-CM

## 2020-07-23 DIAGNOSIS — R5381 Other malaise: Secondary | ICD-10-CM

## 2020-07-23 DIAGNOSIS — R251 Tremor, unspecified: Secondary | ICD-10-CM

## 2020-07-23 DIAGNOSIS — J4520 Mild intermittent asthma, uncomplicated: Secondary | ICD-10-CM

## 2020-07-23 DIAGNOSIS — R0600 Dyspnea, unspecified: Secondary | ICD-10-CM | POA: Diagnosis not present

## 2020-07-23 NOTE — Assessment & Plan Note (Signed)
Activity as tolerated

## 2020-07-23 NOTE — Assessment & Plan Note (Signed)
Dyspnea with activity-no significant change off of propanolol.  Suspect is multifactorial with underlying asthma, diastolic dysfunction and deconditioning with morbid obesity. Patient is to continue with activity as tolerated.  Plan  Patient Instructions  Will send message to Dr. Carles Collet regarding propranolol restart .  Advance activity as tolerated.  Take Flovent 2 puffs Twice daily, rinse after use.  Albuterol inhaler As needed   Follow up with Dr. Halford Chessman  in 4-6 months and As needed

## 2020-07-23 NOTE — Progress Notes (Signed)
@Patient  ID: Amanda Copeland, female    DOB: Feb 26, 1940, 80 y.o.   MRN: [id number]  Chief Complaint  Patient presents with   Follow-up    Sob,     Referring provider: [name], MD  HPI: 80 year old female never smoker followed for asthma (positive MCT)  and shortness of breath Medical history significant for benign essential tremor, diastolic heart failure, hypertension, breast cancer  TEST/EVENTS :  PFT 11/28/16 >> FEV1 1.54 (86%), FEV1% 77, TLC 4.24 (92%), DLCO 71% PFT 09/05/19 >> FEV1 1.37 (80%), FEV1% 79, TLC 4.36 (94%), DLCO 94%, +BD Cardiac CT 03/14/19 >> no significant lung findings, coronary calcium score 26   Cardiac Tests:  Echo 06/25/19 >> EF 60 to 65%, grade 1 DD CPST 11/18/19 >> submaximal effort, mild reduction inf functional capacity, ventilatory limitation due to body habitus  allergy profile  10/06/16  >  Eos 0.2 /  IgE  4  RAST neg  Trial off propranolol 10/06/16 > no change , Trial off propranolol 2021-2022 no change   - FENO 02/23/2017  =   Could not do  - Spirometry 02/23/2017  FEV1 1.32 (75%)  Ratio 72    - Sinus CT 02/24/2017 > Left ethmoid and left maxillary sinus mucosal thickening and fluid suggesting acute sinusitis >rx omnicef 300 mg twice daily x 10 days since amox reaction only a rash  - MCT 03/08/17 POS reversible airflow obst > reproduced symptoms   - repeat sinus CT  03/14/2017 > 1. Improved left ethmoid sinusitis when compared to 02/24/2017. 2. Unchanged moderate mucosal thickening in the left maxillary Sinus.> rec   shoekaker eval > seen 03/21/17 rec flonase only     07/23/2020 Follow up : Asthma , Tremor , Dyspnea  Patient presents for a follow-up visit.  Patient has underlying history of asthma and shortness of breath.  There was concern that patient's shortness of breath was being increased by propanolol.  This was discontinued last fall.  Patient says that since stopping propranolol she is really not noticed any change in her breathing.  She  did stop propranolol in 2018 and had no change in her breathing at all.  Unfortunately patient says that her essential tremor has significantly worsened she has tried several different medications for her tremor without benefit.  Patient complains that her tremor is debilitating.  She has trouble dressing, eating or doing ADLs.  She is quite frustrated and wants to be restarted on this.  Patient is on Flovent for her asthma.  She says she has not been taken on a regular basis.  She says she does get short of breath with heavy activities.  But has no significant cough or wheezing currently.  Previous pulmonary function testing in July 2021 showed no significant obstruction or restriction.  A positive bronchodilator response.  Chest x-ray last year showed clear lungs.  Patient is a never smoker. Patient denies any chest pain orthopnea PND or increased leg swelling.    Allergies  Allergen Reactions   Amoxicillin Rash   Ampicillin Rash   Penicillins Rash   Statins Other (See Comments)    Per reports muscle aches and joint pain Per reports muscle aches and joint pain    Immunization History  Administered Date(s) Administered   Fluad Quad(high Dose 65+) 10/08/2018, 10/25/2019   Influenza Whole 11/15/2017   Influenza, High Dose Seasonal PF 11/09/2015, 10/31/2016   Influenza-Unspecified 09/07/2012, 10/09/2014, 11/03/2016, 10/02/2019   PFIZER Comirnaty(Gray Top)Covid-19 Tri-Sucrose Vaccine 05/08/2020   PFIZER(Purple Top)SARS-COV-2 Vaccination  03/16/2019, 04/10/2019, 11/04/2019   Pneumococcal Conjugate-13 11/09/2015   Pneumococcal Polysaccharide-23 03/03/2017   Td 11/09/2015   Zoster Recombinat (Shingrix) 07/07/2017, 09/09/2017   Zoster, Live 02/08/2012    Past Medical History:  Diagnosis Date   Arthritis    Asthma    childhood now returning   Back pain affecting pregnancy 11/02/2014   Benign essential tremor 10/28/2014   Bilateral dry eyes    Breast cancer ([place])    right   Cancer ([place])     Breast   Dry eyes    Encounter for Medicare annual wellness exam 05/22/2016   Essential hypertension, benign 10/28/2014   H/O measles    H/O mumps    History of chicken pox    Lumbago 11/02/2014   Mixed hyperlipidemia 10/28/2014   Obesity 10/28/2014   Stenosis of cervical spine     Tobacco History: Social History   Tobacco Use  Smoking Status Never  Smokeless Tobacco Never   Counseling given: Not Answered   Outpatient Medications Prior to Visit  Medication Sig Dispense Refill   albuterol (PROVENTIL HFA;VENTOLIN HFA) 108 (90 Base) MCG/ACT inhaler INHALE 2 PUFFS INTO THE LUNGS EVERY 4 (FOUR) HOURS AS NEEDED FOR WHEEZING OR SHORTNESS OF BREATH. 6.7 Inhaler 1   allopurinol (ZYLOPRIM) 100 MG tablet Take 2 tablets (200 mg total) by mouth daily. 180 tablet 1   AMBULATORY NON FORMULARY MEDICATION Rollator  Dx: Gait instability 1 Device 0   Ascorbic Acid (VITAMIN C PO) Take 1 tablet by mouth daily.      aspirin 81 MG tablet Take 81 mg by mouth daily.     DYMISTA 137-50 MCG/ACT SUSP 1-2 SPRAYS PER NOSTRIL TWICE DAILY FOR RUNNY NOSE 69 g 1   ezetimibe (ZETIA) 10 MG tablet TAKE 1 TABLET BY MOUTH EVERY DAY 90 tablet 0   fluticasone (FLOVENT HFA) 110 MCG/ACT inhaler 2 PUFFS TWICE DAILY TO PREVENT COUGHING OR WHEEZING. MAX INS WILL COVER IS A 30 DAY 36 each 1   furosemide (LASIX) 40 MG tablet TAKE 1 TABLET BY MOUTH EVERY DAY 90 tablet 3   levothyroxine (SYNTHROID) 112 MCG tablet Take 1 tablet (112 mcg total) by mouth daily before breakfast. 90 tablet 1   montelukast (SINGULAIR) 10 MG tablet Take 1 tablet (10 mg total) by mouth at bedtime. 90 tablet 1   Multiple Vitamins-Minerals (ICAPS AREDS 2) CAPS Take 1 capsule by mouth 2 (two) times daily.     PREVACID 30 MG capsule TAKE 1 CAPSULE BY MOUTH TWICE A DAY --MAX LIMIT 180 capsule 1   primidone (MYSOLINE) 50 MG tablet Take 1 tablet (50 mg total) by mouth at bedtime. 90 tablet 1   Red Yeast Rice Extract (RED YEAST RICE PO) Take by mouth daily.      TIADYLT ER 180 MG 24 hr capsule TAKE 1 CAPSULE BY MOUTH EVERY DAY 90 capsule 1   colchicine 0.6 MG tablet Take 2 tabs at first and then 1 tab q 2 hours til pain relief or max of 6 tabs in 24 hours (Patient not taking: Reported on 07/23/2020) 6 tablet 1   KLOR-CON M20 20 MEQ tablet TAKE 1 TABLET BY MOUTH DAILY. KEEP UPCOMING APPOINTMENT (Patient not taking: Reported on 07/23/2020) 90 tablet 3   propranolol (INDERAL) 40 MG tablet TAKE 1 TABLET (40 MG TOTAL) BY MOUTH 3 (THREE) TIMES DAILY. (Patient not taking: Reported on 07/23/2020) 270 tablet 0   No facility-administered medications prior to visit.     Review of Systems:  Constitutional:   No  weight loss, night sweats,  Fevers, chills,  +fatigue, or  lassitude.  HEENT:   No headaches,  Difficulty swallowing,  Tooth/dental problems, or  Sore throat,                No sneezing, itching, ear ache, nasal congestion, post nasal drip,   CV:  No chest pain,  Orthopnea, PND, swelling in lower extremities, anasarca, dizziness, palpitations, syncope.   GI  No heartburn, indigestion, abdominal pain, nausea, vomiting, diarrhea, change in bowel habits, loss of appetite, bloody stools.   Resp:   No excess mucus, no productive cough,  No non-productive cough,  No coughing up of blood.  No change in color of mucus.  No wheezing.  No chest wall deformity  Skin: no rash or lesions.  GU: no dysuria, change in color of urine, no urgency or frequency.  No flank pain, no hematuria   MS:  No joint pain or swelling.  No decreased range of motion.  No back pain.    Physical Exam  Pulse 80   Temp 97.8 F (36.6 C) (Temporal)   Ht 5\' 1"  (1.549 m)   Wt 244 lb (110.7 kg)   SpO2 100%   BMI 46.10 kg/m   GEN: A/Ox3; pleasant , NAD, well nourished    HEENT:  [place]/AT,   NOSE-clear, THROAT-clear, no lesions, no postnasal drip or exudate noted.   NECK:  Supple w/ fair ROM; no JVD; normal carotid impulses w/o bruits; no thyromegaly or nodules palpated; no  lymphadenopathy.    RESP  Clear  P & A; w/o, wheezes/ rales/ or rhonchi. no accessory muscle use, no dullness to percussion  CARD:  RRR, no m/r/g, tr  peripheral edema, pulses intact, no cyanosis or clubbing.  GI:   Soft & nt; nml bowel sounds; no organomegaly or masses detected.   Musco: Warm bil, no deformities or joint swelling noted.   Neuro: alert, no focal deficits noted.  + tremor ->right hand /arm   Skin: Warm, no lesions or rashes    Lab Results:  CBC    Component Value Date/Time   WBC 7.0 02/27/2020 0939   RBC 4.26 02/27/2020 0939   HGB 13.5 02/27/2020 0939   HCT 40.7 02/27/2020 0939   PLT 313.0 02/27/2020 0939   MCV 95.5 02/27/2020 0939   MCHC 33.3 02/27/2020 0939   RDW 14.7 02/27/2020 0939   LYMPHSABS 1.6 10/06/2016 1218   MONOABS 0.8 10/06/2016 1218   EOSABS 0.2 10/06/2016 1218   BASOSABS 0.1 10/06/2016 1218    BMET    Component Value Date/Time   NA 140 02/27/2020 0939   NA 145 (H) 06/11/2019 1141   K 4.5 02/27/2020 0939   CL 104 02/27/2020 0939   CO2 29 02/27/2020 0939   GLUCOSE 103 (H) 02/27/2020 0939   BUN 13 02/27/2020 0939   BUN 10 06/11/2019 1141   CREATININE 0.92 02/27/2020 0939   CALCIUM 9.7 02/27/2020 0939   GFRNONAA 70 06/11/2019 1141   GFRAA 81 06/11/2019 1141    BNP No results found for: BNP  ProBNP    Component Value Date/Time   PROBNP 636 06/11/2019 1141   PROBNP 310.0 (H) 10/06/2016 1218    Imaging: No results found.    PFT Results Latest Ref Rng & Units 09/04/2019 03/08/2017 11/28/2016  FVC-Pre L 1.52 1.56 1.96  FVC-Predicted Pre % 66 65 82  FVC-Post L 1.72 1.68 1.98  FVC-Predicted Post % 74 70 83  Pre  FEV1/FVC % % 80 75 77  Post FEV1/FCV % % 79 79 77  FEV1-Pre L 1.22 1.17 1.52  FEV1-Predicted Pre % 71 66 85  FEV1-Post L 1.37 1.32 1.54  DLCO uncorrected ml/min/mmHg 16.03 - 14.38  DLCO UNC% % 94 - 71  DLCO corrected ml/min/mmHg 16.23 - 15.30  DLCO COR %Predicted % 95 - 75  DLVA Predicted % 113 - 97  TLC L 4.36  - 4.24  TLC % Predicted % 94 - 92  RV % Predicted % 113 - 92    No results found for: NITRICOXIDE      Assessment & Plan:   Asthma Mild intermittent asthma -positive methacholine challenge test in 2019. Suspect patient's dyspnea is multifactorial with underlying deconditioning diastolic heart failure.  Patient had no significant change off of propanolol with her dyspnea.  With her debilitating tremor and poor quality of her life-suspect propranolol side effects will be minimum on her asthma. Do recommend patient take her Flovent on a regular basis.  Asthma action plan discussed. Albuterol inhaler as needed  Plan  Patient Instructions  Will send message to Dr. Carles Collet regarding propranolol restart .  Advance activity as tolerated.  Take Flovent 2 puffs Twice daily, rinse after use.  Albuterol inhaler As needed   Follow up with Dr. Halford Chessman  in 4-6 months and As needed        Dyspnea on exertion Dyspnea with activity-no significant change off of propanolol.  Suspect is multifactorial with underlying asthma, diastolic dysfunction and deconditioning with morbid obesity. Patient is to continue with activity as tolerated.  Plan  Patient Instructions  Will send message to Dr. Carles Collet regarding propranolol restart .  Advance activity as tolerated.  Take Flovent 2 puffs Twice daily, rinse after use.  Albuterol inhaler As needed   Follow up with Dr. Halford Chessman  in 4-6 months and As needed        Morbid obesity ([place]) Healthy weight loss discussed  Physical deconditioning Activity as tolerated.  Tremor Benign essential tremor followed by neurology. Patient says the only effective medication has been propanolol.  She has significant symptom burden with debilitating symptoms and poor quality of life with her underlying tremor.. From a pulmonary standpoint is okay to restart propanolol if indicated by neurology  Plan  Patient Instructions  Will send message to Dr. Carles Collet regarding propranolol  restart .  Advance activity as tolerated.  Take Flovent 2 puffs Twice daily, rinse after use.  Albuterol inhaler As needed   Follow up with Dr. Halford Chessman  in 4-6 months and As needed        I spent   38 minutes dedicated to the care of this patient on the date of this encounter to include pre-visit review of records, face-to-face time with the patient discussing conditions above, post visit ordering of testing, clinical documentation with the electronic health record, making appropriate referrals as documented, and communicating necessary findings to members of the patients care team.    Rexene Edison, NP 07/23/2020

## 2020-07-23 NOTE — Assessment & Plan Note (Signed)
Benign essential tremor followed by neurology. Patient says the only effective medication has been propanolol.  She has significant symptom burden with debilitating symptoms and poor quality of life with her underlying tremor.. From a pulmonary standpoint is okay to restart propanolol if indicated by neurology  Plan  Patient Instructions  Will send message to Dr. Carles Collet regarding propranolol restart .  Advance activity as tolerated.  Take Flovent 2 puffs Twice daily, rinse after use.  Albuterol inhaler As needed   Follow up with Dr. Halford Chessman  in 4-6 months and As needed

## 2020-07-23 NOTE — Patient Instructions (Addendum)
Will send message to Dr. Carles Collet regarding propranolol restart .  Advance activity as tolerated.  Take Flovent 2 puffs Twice daily, rinse after use.  Albuterol inhaler As needed   Follow up with Dr. Halford Chessman  in 4-6 months and As needed

## 2020-07-23 NOTE — Progress Notes (Signed)
Reviewed and agree with assessment/plan.   Chesley Mires, MD [facility] Pulmonary/Critical Care 07/23/2020, 3:28 PM Pager:  [phone]

## 2020-07-23 NOTE — Assessment & Plan Note (Signed)
Healthy weight loss discussed 

## 2020-07-23 NOTE — Assessment & Plan Note (Signed)
Mild intermittent asthma -positive methacholine challenge test in 2019. Suspect patient's dyspnea is multifactorial with underlying deconditioning diastolic heart failure.  Patient had no significant change off of propanolol with her dyspnea.  With her debilitating tremor and poor quality of her life-suspect propranolol side effects will be minimum on her asthma. Do recommend patient take her Flovent on a regular basis.  Asthma action plan discussed. Albuterol inhaler as needed  Plan  Patient Instructions  Will send message to Dr. Carles Collet regarding propranolol restart .  Advance activity as tolerated.  Take Flovent 2 puffs Twice daily, rinse after use.  Albuterol inhaler As needed   Follow up with Dr. Halford Chessman  in 4-6 months and As needed

## 2020-07-24 NOTE — Telephone Encounter (Signed)
-----   Message from Chesley Mires, MD sent at 07/24/2020  9:11 AM EDT ----- Regarding: RE: Propranolol [place],  I had a conversation with Ms. Lesperance in December 2021 about use of propranolol.  She felt that controlling her tremor significantly outweighs any possible concern for worsening asthma from propranolol use.  Additionally, it doesn't seem like the drug holiday from propranolol improved her respiratory symptoms in anyway but did cause her tremor to get worse.  I am okay if you feel that she would benefit from restarting propranolol.  [place]  ----- Message ----- From: Melvenia Needles, NP Sent: 07/23/2020   3:25 PM EDT To: Chesley Mires, MD, Eustace Quail Swain [place], DO Subject: Propranolol                                    Dr. Carles Collet,   I saw Ms. Katich today in clinic.  From a pulmonary standpoint it is okay with me for restart the propranolol (if indicated ) .  She had no significant change in her level of dyspnea off of this.  I did ask her to take her Flovent on a regular basis.  She says she is quite debilitated from this tremor and the only effective medication has been propanolol.  I will also copy Dr. Halford Chessman in case he has other recommendations.   Thanks  [place]

## 2020-07-24 NOTE — Telephone Encounter (Signed)
Per Dr. Halford Chessman, it is okay for the patient to restart her propranolol as previous from his standpoint, and therefore it is fine with me.  Let pt know.

## 2020-07-27 MED ORDER — PROPRANOLOL HCL 40 MG PO TABS: 40.0000 mg | ORAL_TABLET | Freq: Two times a day (BID) | ORAL | 1 refills | Status: DC

## 2020-07-28 ENCOUNTER — Other Ambulatory Visit: Payer: Self-pay | Admitting: Family Medicine

## 2020-07-28 DIAGNOSIS — E782 Mixed hyperlipidemia: Secondary | ICD-10-CM

## 2020-08-03 ENCOUNTER — Other Ambulatory Visit: Payer: Self-pay | Admitting: Family Medicine

## 2020-08-12 DIAGNOSIS — R5383 Other fatigue: Secondary | ICD-10-CM | POA: Diagnosis not present

## 2020-08-12 DIAGNOSIS — Z6841 Body Mass Index (BMI) 40.0 and over, adult: Secondary | ICD-10-CM | POA: Diagnosis not present

## 2020-08-12 DIAGNOSIS — S8252XA Displaced fracture of medial malleolus of left tibia, initial encounter for closed fracture: Secondary | ICD-10-CM | POA: Diagnosis not present

## 2020-08-12 DIAGNOSIS — I21A1 Myocardial infarction type 2: Secondary | ICD-10-CM | POA: Diagnosis not present

## 2020-08-12 DIAGNOSIS — S82891A Other fracture of right lower leg, initial encounter for closed fracture: Secondary | ICD-10-CM | POA: Diagnosis not present

## 2020-08-12 DIAGNOSIS — M7989 Other specified soft tissue disorders: Secondary | ICD-10-CM | POA: Diagnosis not present

## 2020-08-12 DIAGNOSIS — I161 Hypertensive emergency: Secondary | ICD-10-CM | POA: Diagnosis not present

## 2020-08-12 DIAGNOSIS — R531 Weakness: Secondary | ICD-10-CM | POA: Diagnosis not present

## 2020-08-12 DIAGNOSIS — S82842A Displaced bimalleolar fracture of left lower leg, initial encounter for closed fracture: Secondary | ICD-10-CM | POA: Diagnosis not present

## 2020-08-12 DIAGNOSIS — I517 Cardiomegaly: Secondary | ICD-10-CM | POA: Diagnosis not present

## 2020-08-12 DIAGNOSIS — I5032 Chronic diastolic (congestive) heart failure: Secondary | ICD-10-CM | POA: Diagnosis not present

## 2020-08-12 DIAGNOSIS — S82832A Other fracture of upper and lower end of left fibula, initial encounter for closed fracture: Secondary | ICD-10-CM | POA: Diagnosis not present

## 2020-08-12 DIAGNOSIS — Y998 Other external cause status: Secondary | ICD-10-CM | POA: Diagnosis not present

## 2020-08-12 DIAGNOSIS — Z20822 Contact with and (suspected) exposure to covid-19: Secondary | ICD-10-CM | POA: Diagnosis not present

## 2020-08-12 DIAGNOSIS — I5A Non-ischemic myocardial injury (non-traumatic): Secondary | ICD-10-CM | POA: Diagnosis not present

## 2020-08-12 DIAGNOSIS — W1839XA Other fall on same level, initial encounter: Secondary | ICD-10-CM | POA: Diagnosis not present

## 2020-08-12 DIAGNOSIS — M25572 Pain in left ankle and joints of left foot: Secondary | ICD-10-CM | POA: Diagnosis not present

## 2020-08-12 DIAGNOSIS — R001 Bradycardia, unspecified: Secondary | ICD-10-CM | POA: Diagnosis not present

## 2020-08-13 DIAGNOSIS — K5909 Other constipation: Secondary | ICD-10-CM | POA: Diagnosis not present

## 2020-08-13 DIAGNOSIS — M25572 Pain in left ankle and joints of left foot: Secondary | ICD-10-CM | POA: Diagnosis not present

## 2020-08-13 DIAGNOSIS — S8252XD Displaced fracture of medial malleolus of left tibia, subsequent encounter for closed fracture with routine healing: Secondary | ICD-10-CM | POA: Diagnosis not present

## 2020-08-13 DIAGNOSIS — Z9010 Acquired absence of unspecified breast and nipple: Secondary | ICD-10-CM | POA: Diagnosis not present

## 2020-08-13 DIAGNOSIS — E039 Hypothyroidism, unspecified: Secondary | ICD-10-CM | POA: Diagnosis present

## 2020-08-13 DIAGNOSIS — I110 Hypertensive heart disease with heart failure: Secondary | ICD-10-CM | POA: Diagnosis present

## 2020-08-13 DIAGNOSIS — S82402A Unspecified fracture of shaft of left fibula, initial encounter for closed fracture: Secondary | ICD-10-CM | POA: Diagnosis not present

## 2020-08-13 DIAGNOSIS — R001 Bradycardia, unspecified: Secondary | ICD-10-CM | POA: Diagnosis present

## 2020-08-13 DIAGNOSIS — M79661 Pain in right lower leg: Secondary | ICD-10-CM | POA: Diagnosis not present

## 2020-08-13 DIAGNOSIS — Z853 Personal history of malignant neoplasm of breast: Secondary | ICD-10-CM | POA: Diagnosis not present

## 2020-08-13 DIAGNOSIS — J309 Allergic rhinitis, unspecified: Secondary | ICD-10-CM | POA: Diagnosis not present

## 2020-08-13 DIAGNOSIS — M25571 Pain in right ankle and joints of right foot: Secondary | ICD-10-CM | POA: Diagnosis not present

## 2020-08-13 DIAGNOSIS — I10 Essential (primary) hypertension: Secondary | ICD-10-CM | POA: Diagnosis not present

## 2020-08-13 DIAGNOSIS — G250 Essential tremor: Secondary | ICD-10-CM | POA: Diagnosis not present

## 2020-08-13 DIAGNOSIS — Z6841 Body Mass Index (BMI) 40.0 and over, adult: Secondary | ICD-10-CM | POA: Diagnosis not present

## 2020-08-13 DIAGNOSIS — J4540 Moderate persistent asthma, uncomplicated: Secondary | ICD-10-CM | POA: Diagnosis present

## 2020-08-13 DIAGNOSIS — I214 Non-ST elevation (NSTEMI) myocardial infarction: Secondary | ICD-10-CM | POA: Diagnosis not present

## 2020-08-13 DIAGNOSIS — S8252XA Displaced fracture of medial malleolus of left tibia, initial encounter for closed fracture: Secondary | ICD-10-CM | POA: Diagnosis not present

## 2020-08-13 DIAGNOSIS — K5900 Constipation, unspecified: Secondary | ICD-10-CM | POA: Diagnosis not present

## 2020-08-13 DIAGNOSIS — S82892A Other fracture of left lower leg, initial encounter for closed fracture: Secondary | ICD-10-CM | POA: Diagnosis not present

## 2020-08-13 DIAGNOSIS — E7800 Pure hypercholesterolemia, unspecified: Secondary | ICD-10-CM | POA: Diagnosis present

## 2020-08-13 DIAGNOSIS — Z880 Allergy status to penicillin: Secondary | ICD-10-CM | POA: Diagnosis not present

## 2020-08-13 DIAGNOSIS — Z9049 Acquired absence of other specified parts of digestive tract: Secondary | ICD-10-CM | POA: Diagnosis not present

## 2020-08-13 DIAGNOSIS — Z743 Need for continuous supervision: Secondary | ICD-10-CM | POA: Diagnosis not present

## 2020-08-13 DIAGNOSIS — M109 Gout, unspecified: Secondary | ICD-10-CM | POA: Diagnosis present

## 2020-08-13 DIAGNOSIS — S82402D Unspecified fracture of shaft of left fibula, subsequent encounter for closed fracture with routine healing: Secondary | ICD-10-CM | POA: Diagnosis not present

## 2020-08-13 DIAGNOSIS — R778 Other specified abnormalities of plasma proteins: Secondary | ICD-10-CM | POA: Diagnosis not present

## 2020-08-13 DIAGNOSIS — R531 Weakness: Secondary | ICD-10-CM | POA: Diagnosis not present

## 2020-08-13 DIAGNOSIS — J310 Chronic rhinitis: Secondary | ICD-10-CM | POA: Diagnosis present

## 2020-08-13 DIAGNOSIS — W19XXXD Unspecified fall, subsequent encounter: Secondary | ICD-10-CM | POA: Diagnosis not present

## 2020-08-13 DIAGNOSIS — Z20822 Contact with and (suspected) exposure to covid-19: Secondary | ICD-10-CM | POA: Diagnosis not present

## 2020-08-13 DIAGNOSIS — E785 Hyperlipidemia, unspecified: Secondary | ICD-10-CM | POA: Diagnosis not present

## 2020-08-13 DIAGNOSIS — R5381 Other malaise: Secondary | ICD-10-CM | POA: Diagnosis not present

## 2020-08-13 DIAGNOSIS — I5032 Chronic diastolic (congestive) heart failure: Secondary | ICD-10-CM | POA: Diagnosis present

## 2020-08-13 DIAGNOSIS — K219 Gastro-esophageal reflux disease without esophagitis: Secondary | ICD-10-CM | POA: Diagnosis present

## 2020-08-13 DIAGNOSIS — Z96653 Presence of artificial knee joint, bilateral: Secondary | ICD-10-CM | POA: Diagnosis present

## 2020-08-13 DIAGNOSIS — J449 Chronic obstructive pulmonary disease, unspecified: Secondary | ICD-10-CM | POA: Diagnosis not present

## 2020-08-13 DIAGNOSIS — I161 Hypertensive emergency: Secondary | ICD-10-CM | POA: Diagnosis present

## 2020-08-13 DIAGNOSIS — S82891D Other fracture of right lower leg, subsequent encounter for closed fracture with routine healing: Secondary | ICD-10-CM | POA: Diagnosis not present

## 2020-08-13 DIAGNOSIS — Z79899 Other long term (current) drug therapy: Secondary | ICD-10-CM | POA: Diagnosis not present

## 2020-08-13 DIAGNOSIS — S82842A Displaced bimalleolar fracture of left lower leg, initial encounter for closed fracture: Secondary | ICD-10-CM | POA: Diagnosis not present

## 2020-08-13 DIAGNOSIS — R9431 Abnormal electrocardiogram [ECG] [EKG]: Secondary | ICD-10-CM | POA: Diagnosis not present

## 2020-08-13 DIAGNOSIS — I2510 Atherosclerotic heart disease of native coronary artery without angina pectoris: Secondary | ICD-10-CM | POA: Diagnosis not present

## 2020-08-13 DIAGNOSIS — Z7951 Long term (current) use of inhaled steroids: Secondary | ICD-10-CM | POA: Diagnosis not present

## 2020-08-13 DIAGNOSIS — R7989 Other specified abnormal findings of blood chemistry: Secondary | ICD-10-CM | POA: Diagnosis not present

## 2020-08-13 DIAGNOSIS — I21A1 Myocardial infarction type 2: Secondary | ICD-10-CM | POA: Diagnosis present

## 2020-08-13 DIAGNOSIS — I248 Other forms of acute ischemic heart disease: Secondary | ICD-10-CM | POA: Diagnosis not present

## 2020-08-13 DIAGNOSIS — S82842D Displaced bimalleolar fracture of left lower leg, subsequent encounter for closed fracture with routine healing: Secondary | ICD-10-CM | POA: Diagnosis not present

## 2020-08-14 ENCOUNTER — Other Ambulatory Visit: Payer: Self-pay | Admitting: Family Medicine

## 2020-08-14 DIAGNOSIS — R778 Other specified abnormalities of plasma proteins: Secondary | ICD-10-CM | POA: Diagnosis not present

## 2020-08-14 DIAGNOSIS — S82842A Displaced bimalleolar fracture of left lower leg, initial encounter for closed fracture: Secondary | ICD-10-CM | POA: Diagnosis not present

## 2020-08-14 DIAGNOSIS — S82402A Unspecified fracture of shaft of left fibula, initial encounter for closed fracture: Secondary | ICD-10-CM | POA: Diagnosis not present

## 2020-08-14 DIAGNOSIS — S8252XA Displaced fracture of medial malleolus of left tibia, initial encounter for closed fracture: Secondary | ICD-10-CM | POA: Diagnosis not present

## 2020-08-15 DIAGNOSIS — R778 Other specified abnormalities of plasma proteins: Secondary | ICD-10-CM | POA: Diagnosis not present

## 2020-08-15 DIAGNOSIS — S82402A Unspecified fracture of shaft of left fibula, initial encounter for closed fracture: Secondary | ICD-10-CM | POA: Diagnosis not present

## 2020-08-15 DIAGNOSIS — S8252XA Displaced fracture of medial malleolus of left tibia, initial encounter for closed fracture: Secondary | ICD-10-CM | POA: Diagnosis not present

## 2020-08-15 DIAGNOSIS — S82842A Displaced bimalleolar fracture of left lower leg, initial encounter for closed fracture: Secondary | ICD-10-CM | POA: Diagnosis not present

## 2020-08-16 DIAGNOSIS — G250 Essential tremor: Secondary | ICD-10-CM | POA: Diagnosis not present

## 2020-08-16 DIAGNOSIS — R778 Other specified abnormalities of plasma proteins: Secondary | ICD-10-CM | POA: Diagnosis not present

## 2020-08-16 DIAGNOSIS — S8252XD Displaced fracture of medial malleolus of left tibia, subsequent encounter for closed fracture with routine healing: Secondary | ICD-10-CM | POA: Diagnosis not present

## 2020-08-16 DIAGNOSIS — S82402D Unspecified fracture of shaft of left fibula, subsequent encounter for closed fracture with routine healing: Secondary | ICD-10-CM | POA: Diagnosis not present

## 2020-08-17 DIAGNOSIS — R778 Other specified abnormalities of plasma proteins: Secondary | ICD-10-CM | POA: Diagnosis not present

## 2020-08-17 DIAGNOSIS — S82842A Displaced bimalleolar fracture of left lower leg, initial encounter for closed fracture: Secondary | ICD-10-CM | POA: Diagnosis not present

## 2020-08-17 DIAGNOSIS — S82402D Unspecified fracture of shaft of left fibula, subsequent encounter for closed fracture with routine healing: Secondary | ICD-10-CM | POA: Diagnosis not present

## 2020-08-17 DIAGNOSIS — S8252XD Displaced fracture of medial malleolus of left tibia, subsequent encounter for closed fracture with routine healing: Secondary | ICD-10-CM | POA: Diagnosis not present

## 2020-08-17 DIAGNOSIS — G250 Essential tremor: Secondary | ICD-10-CM | POA: Diagnosis not present

## 2020-08-18 ENCOUNTER — Ambulatory Visit: Payer: Medicare Other | Admitting: Family Medicine

## 2020-08-19 DIAGNOSIS — S82842A Displaced bimalleolar fracture of left lower leg, initial encounter for closed fracture: Secondary | ICD-10-CM | POA: Diagnosis not present

## 2020-08-20 ENCOUNTER — Other Ambulatory Visit: Payer: Self-pay | Admitting: Allergy and Immunology

## 2020-08-20 DIAGNOSIS — J45991 Cough variant asthma: Secondary | ICD-10-CM

## 2020-08-22 DIAGNOSIS — S82892A Other fracture of left lower leg, initial encounter for closed fracture: Secondary | ICD-10-CM | POA: Diagnosis not present

## 2020-08-22 DIAGNOSIS — Z743 Need for continuous supervision: Secondary | ICD-10-CM | POA: Diagnosis not present

## 2020-08-22 DIAGNOSIS — R531 Weakness: Secondary | ICD-10-CM | POA: Diagnosis not present

## 2020-08-22 DIAGNOSIS — J449 Chronic obstructive pulmonary disease, unspecified: Secondary | ICD-10-CM | POA: Diagnosis not present

## 2020-08-22 DIAGNOSIS — W19XXXD Unspecified fall, subsequent encounter: Secondary | ICD-10-CM | POA: Diagnosis not present

## 2020-08-22 DIAGNOSIS — Z853 Personal history of malignant neoplasm of breast: Secondary | ICD-10-CM | POA: Diagnosis not present

## 2020-08-22 DIAGNOSIS — K5900 Constipation, unspecified: Secondary | ICD-10-CM | POA: Diagnosis not present

## 2020-08-22 DIAGNOSIS — J309 Allergic rhinitis, unspecified: Secondary | ICD-10-CM | POA: Diagnosis not present

## 2020-08-22 DIAGNOSIS — F5101 Primary insomnia: Secondary | ICD-10-CM | POA: Diagnosis not present

## 2020-08-22 DIAGNOSIS — Z7951 Long term (current) use of inhaled steroids: Secondary | ICD-10-CM | POA: Diagnosis not present

## 2020-08-22 DIAGNOSIS — M25572 Pain in left ankle and joints of left foot: Secondary | ICD-10-CM | POA: Diagnosis not present

## 2020-08-22 DIAGNOSIS — K219 Gastro-esophageal reflux disease without esophagitis: Secondary | ICD-10-CM | POA: Diagnosis not present

## 2020-08-22 DIAGNOSIS — R5381 Other malaise: Secondary | ICD-10-CM | POA: Diagnosis not present

## 2020-08-22 DIAGNOSIS — I10 Essential (primary) hypertension: Secondary | ICD-10-CM | POA: Diagnosis not present

## 2020-08-22 DIAGNOSIS — S82891D Other fracture of right lower leg, subsequent encounter for closed fracture with routine healing: Secondary | ICD-10-CM | POA: Diagnosis not present

## 2020-08-22 DIAGNOSIS — G4700 Insomnia, unspecified: Secondary | ICD-10-CM | POA: Diagnosis not present

## 2020-08-22 DIAGNOSIS — G250 Essential tremor: Secondary | ICD-10-CM | POA: Diagnosis not present

## 2020-08-24 DIAGNOSIS — S82892A Other fracture of left lower leg, initial encounter for closed fracture: Secondary | ICD-10-CM | POA: Diagnosis not present

## 2020-08-26 ENCOUNTER — Other Ambulatory Visit: Payer: Self-pay | Admitting: Family Medicine

## 2020-08-26 DIAGNOSIS — F5101 Primary insomnia: Secondary | ICD-10-CM | POA: Diagnosis not present

## 2020-08-27 ENCOUNTER — Ambulatory Visit: Payer: Medicare Other | Admitting: Family Medicine

## 2020-08-31 IMAGING — DX DG HIP (WITH OR WITHOUT PELVIS) 2V BILAT
5 series · 5 of 5 positions shown · non-contrast
Comparison: None.

CLINICAL DATA: RIGHT hip and low back pain.

EXAM:
DG HIP (WITH OR WITHOUT PELVIS) 2V BILAT

[pelvis ap]
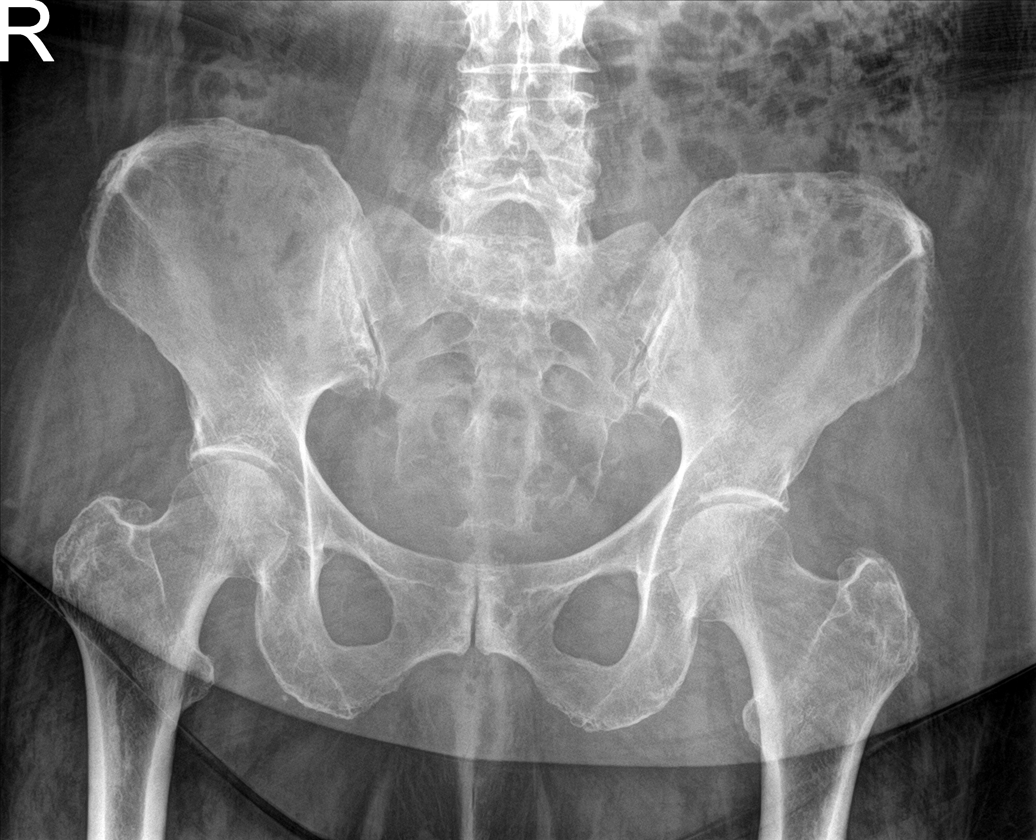

[hip ap (1 of 2)]
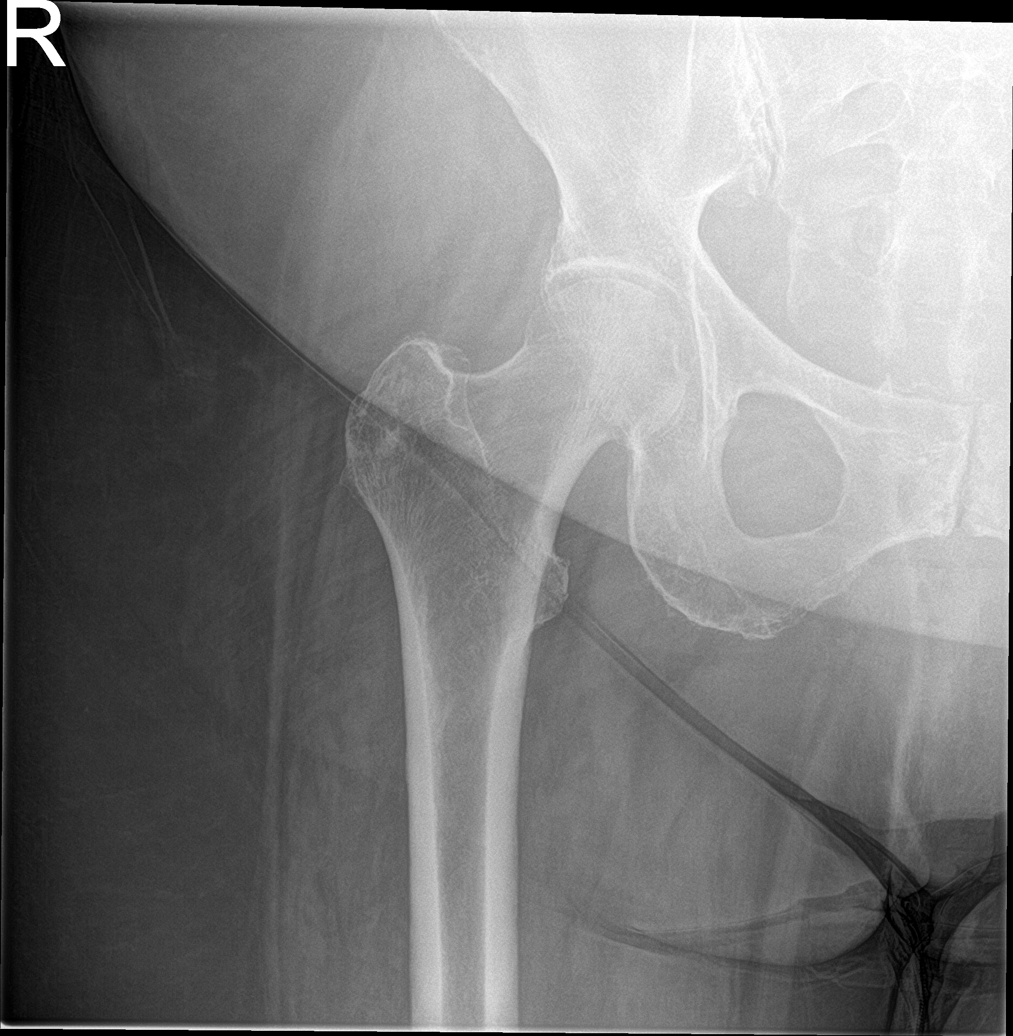

[hip lat (1 of 2)]
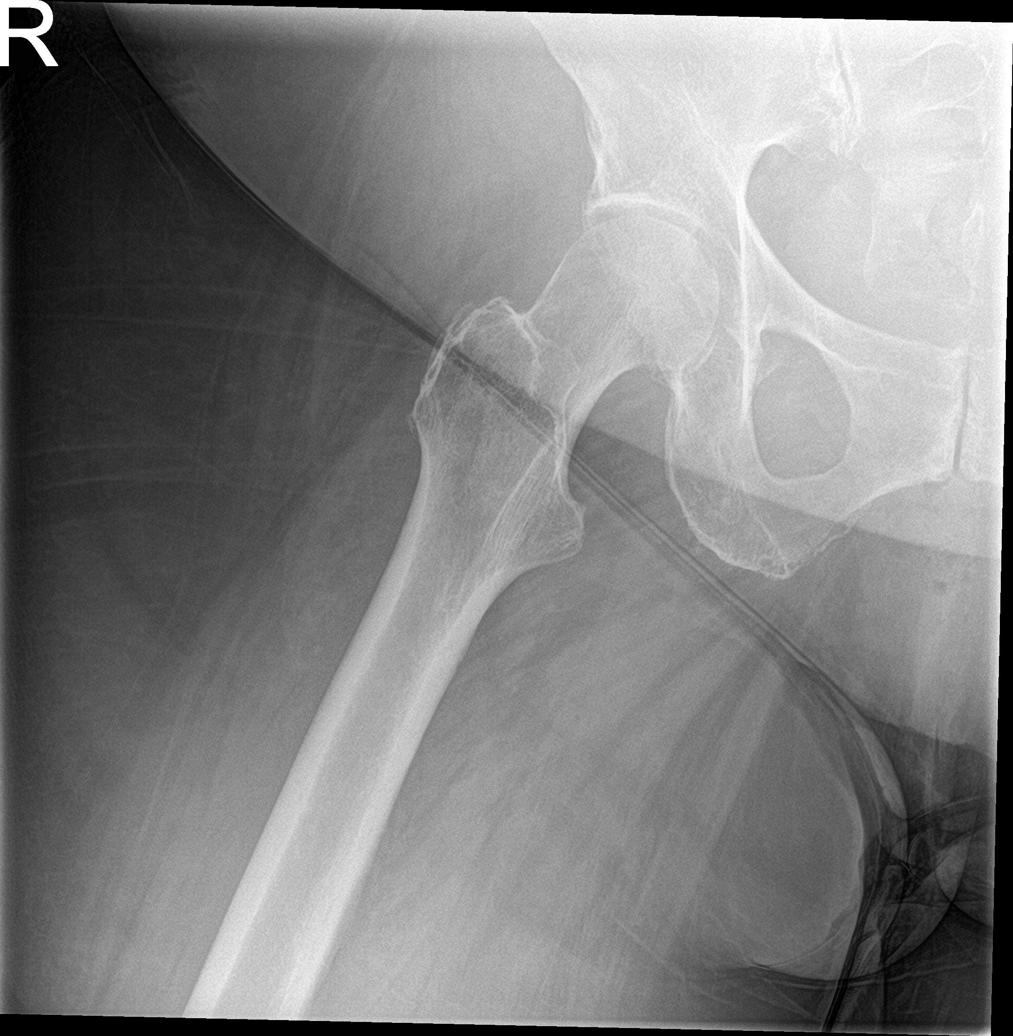

[hip ap (2 of 2)]
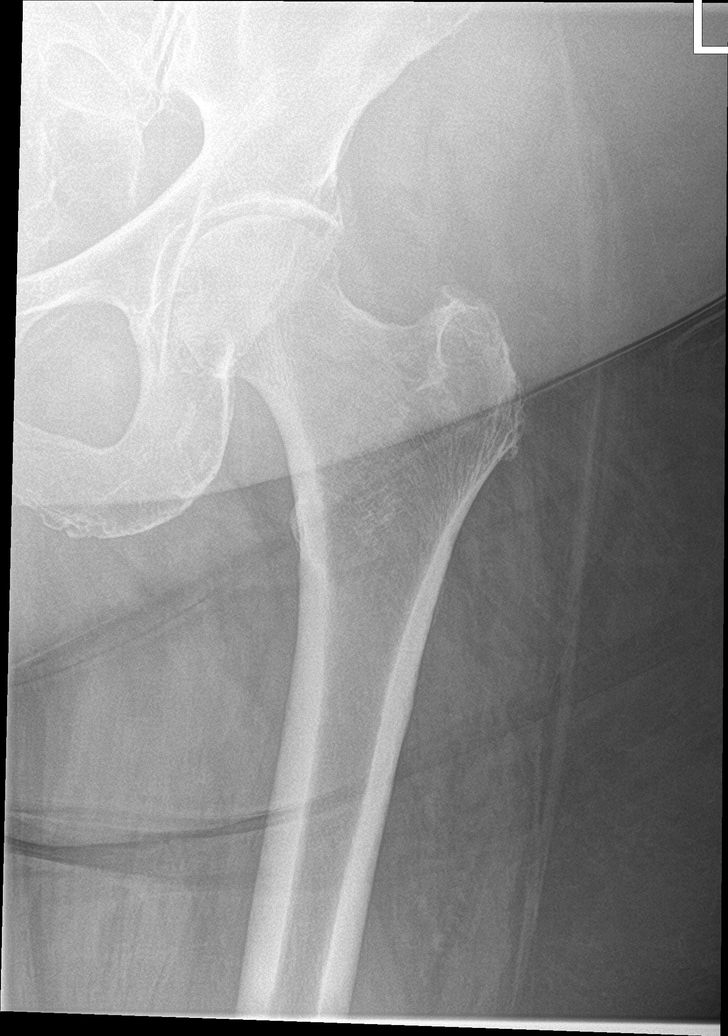

[hip lat (2 of 2)]
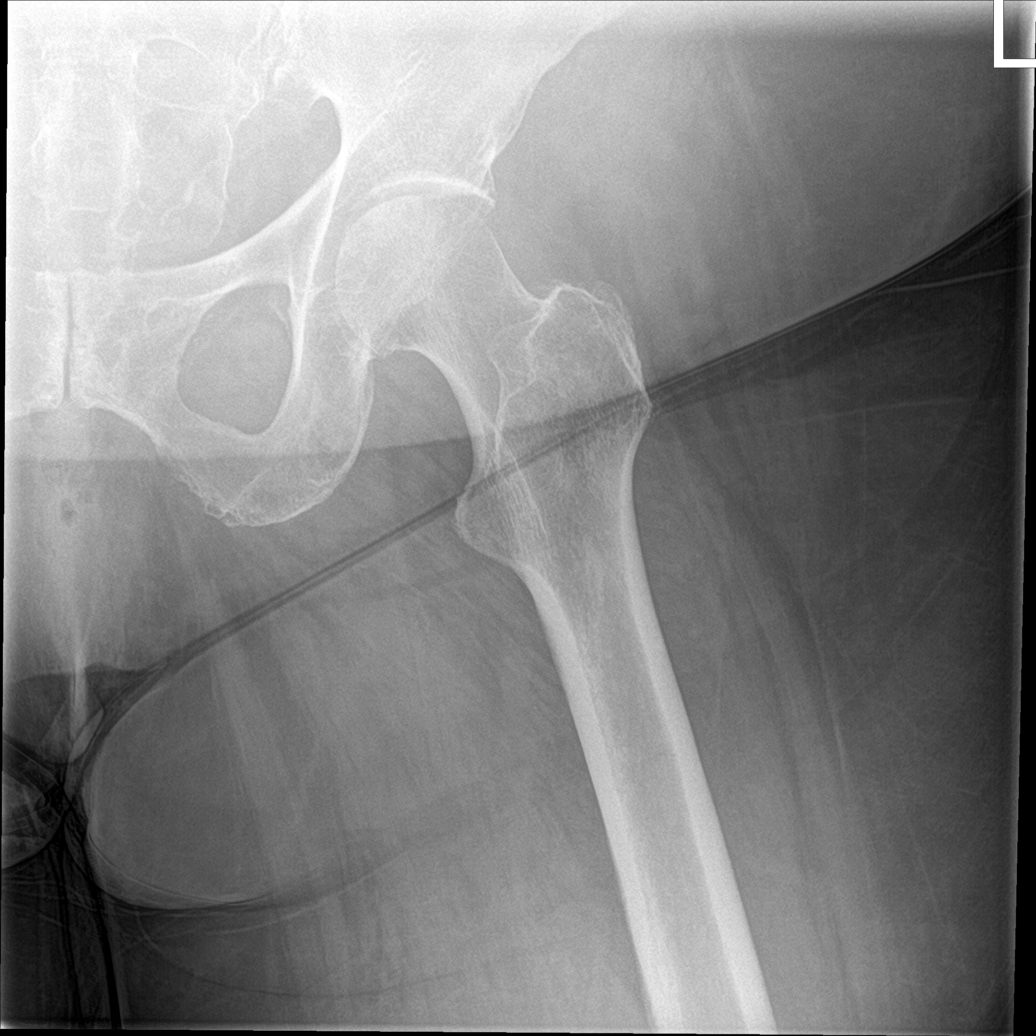

[5 of 5 positions shown; findings below may reference images not displayed]

FINDINGS: Femoral heads are well formed and located. Hip joint spaces are
intact. Mild bilateral sacroiliac osteoarthrosis. Moderate pubic
symphyseal osteoarthrosis.

No destructive bony lesions. Included soft tissue planes are
non-suspicious.
IMPRESSION: 1. No acute osteoporosis or advanced hip degenerative change.

## 2020-09-01 DIAGNOSIS — G250 Essential tremor: Secondary | ICD-10-CM | POA: Diagnosis not present

## 2020-09-01 DIAGNOSIS — I10 Essential (primary) hypertension: Secondary | ICD-10-CM | POA: Diagnosis not present

## 2020-09-01 DIAGNOSIS — R5381 Other malaise: Secondary | ICD-10-CM | POA: Diagnosis not present

## 2020-09-01 DIAGNOSIS — K219 Gastro-esophageal reflux disease without esophagitis: Secondary | ICD-10-CM | POA: Diagnosis not present

## 2020-09-01 DIAGNOSIS — J449 Chronic obstructive pulmonary disease, unspecified: Secondary | ICD-10-CM | POA: Diagnosis not present

## 2020-09-01 DIAGNOSIS — S82892A Other fracture of left lower leg, initial encounter for closed fracture: Secondary | ICD-10-CM | POA: Diagnosis not present

## 2020-09-03 DIAGNOSIS — M25572 Pain in left ankle and joints of left foot: Secondary | ICD-10-CM | POA: Diagnosis not present

## 2020-09-07 DIAGNOSIS — G4700 Insomnia, unspecified: Secondary | ICD-10-CM | POA: Diagnosis not present

## 2020-09-25 DIAGNOSIS — J449 Chronic obstructive pulmonary disease, unspecified: Secondary | ICD-10-CM | POA: Diagnosis not present

## 2020-09-25 DIAGNOSIS — S82892A Other fracture of left lower leg, initial encounter for closed fracture: Secondary | ICD-10-CM | POA: Diagnosis not present

## 2020-09-25 DIAGNOSIS — R5381 Other malaise: Secondary | ICD-10-CM | POA: Diagnosis not present

## 2020-09-25 DIAGNOSIS — G4700 Insomnia, unspecified: Secondary | ICD-10-CM | POA: Diagnosis not present

## 2020-09-25 DIAGNOSIS — K219 Gastro-esophageal reflux disease without esophagitis: Secondary | ICD-10-CM | POA: Diagnosis not present

## 2020-09-25 DIAGNOSIS — I10 Essential (primary) hypertension: Secondary | ICD-10-CM | POA: Diagnosis not present

## 2020-09-30 DIAGNOSIS — F5101 Primary insomnia: Secondary | ICD-10-CM | POA: Diagnosis not present

## 2020-10-01 ENCOUNTER — Ambulatory Visit: Payer: Medicare Other | Admitting: Family Medicine

## 2020-10-01 DIAGNOSIS — M25572 Pain in left ankle and joints of left foot: Secondary | ICD-10-CM | POA: Diagnosis not present

## 2020-10-08 DIAGNOSIS — R5381 Other malaise: Secondary | ICD-10-CM | POA: Diagnosis not present

## 2020-10-08 DIAGNOSIS — J449 Chronic obstructive pulmonary disease, unspecified: Secondary | ICD-10-CM | POA: Diagnosis not present

## 2020-10-08 DIAGNOSIS — S82892A Other fracture of left lower leg, initial encounter for closed fracture: Secondary | ICD-10-CM | POA: Diagnosis not present

## 2020-10-08 DIAGNOSIS — I10 Essential (primary) hypertension: Secondary | ICD-10-CM | POA: Diagnosis not present

## 2020-10-08 DIAGNOSIS — G4700 Insomnia, unspecified: Secondary | ICD-10-CM | POA: Diagnosis not present

## 2020-10-08 DIAGNOSIS — K219 Gastro-esophageal reflux disease without esophagitis: Secondary | ICD-10-CM | POA: Diagnosis not present

## 2020-10-12 DIAGNOSIS — G4700 Insomnia, unspecified: Secondary | ICD-10-CM | POA: Diagnosis not present

## 2020-10-12 DIAGNOSIS — S82832D Other fracture of upper and lower end of left fibula, subsequent encounter for closed fracture with routine healing: Secondary | ICD-10-CM | POA: Diagnosis not present

## 2020-10-12 DIAGNOSIS — Z7951 Long term (current) use of inhaled steroids: Secondary | ICD-10-CM | POA: Diagnosis not present

## 2020-10-12 DIAGNOSIS — M542 Cervicalgia: Secondary | ICD-10-CM | POA: Diagnosis not present

## 2020-10-12 DIAGNOSIS — K219 Gastro-esophageal reflux disease without esophagitis: Secondary | ICD-10-CM | POA: Diagnosis not present

## 2020-10-12 DIAGNOSIS — G250 Essential tremor: Secondary | ICD-10-CM | POA: Diagnosis not present

## 2020-10-12 DIAGNOSIS — I110 Hypertensive heart disease with heart failure: Secondary | ICD-10-CM | POA: Diagnosis not present

## 2020-10-12 DIAGNOSIS — Z7982 Long term (current) use of aspirin: Secondary | ICD-10-CM | POA: Diagnosis not present

## 2020-10-12 DIAGNOSIS — S82842D Displaced bimalleolar fracture of left lower leg, subsequent encounter for closed fracture with routine healing: Secondary | ICD-10-CM | POA: Diagnosis not present

## 2020-10-12 DIAGNOSIS — M109 Gout, unspecified: Secondary | ICD-10-CM | POA: Diagnosis not present

## 2020-10-12 DIAGNOSIS — Z853 Personal history of malignant neoplasm of breast: Secondary | ICD-10-CM | POA: Diagnosis not present

## 2020-10-12 DIAGNOSIS — J449 Chronic obstructive pulmonary disease, unspecified: Secondary | ICD-10-CM | POA: Diagnosis not present

## 2020-10-12 DIAGNOSIS — M25551 Pain in right hip: Secondary | ICD-10-CM | POA: Diagnosis not present

## 2020-10-12 DIAGNOSIS — E039 Hypothyroidism, unspecified: Secondary | ICD-10-CM | POA: Diagnosis not present

## 2020-10-12 DIAGNOSIS — R1030 Lower abdominal pain, unspecified: Secondary | ICD-10-CM | POA: Diagnosis not present

## 2020-10-12 DIAGNOSIS — I5032 Chronic diastolic (congestive) heart failure: Secondary | ICD-10-CM | POA: Diagnosis not present

## 2020-10-12 DIAGNOSIS — R739 Hyperglycemia, unspecified: Secondary | ICD-10-CM | POA: Diagnosis not present

## 2020-10-12 DIAGNOSIS — K5900 Constipation, unspecified: Secondary | ICD-10-CM | POA: Diagnosis not present

## 2020-10-12 DIAGNOSIS — W19XXXD Unspecified fall, subsequent encounter: Secondary | ICD-10-CM | POA: Diagnosis not present

## 2020-10-12 DIAGNOSIS — J310 Chronic rhinitis: Secondary | ICD-10-CM | POA: Diagnosis not present

## 2020-10-13 DIAGNOSIS — S82842D Displaced bimalleolar fracture of left lower leg, subsequent encounter for closed fracture with routine healing: Secondary | ICD-10-CM | POA: Diagnosis not present

## 2020-10-13 DIAGNOSIS — J449 Chronic obstructive pulmonary disease, unspecified: Secondary | ICD-10-CM | POA: Diagnosis not present

## 2020-10-13 DIAGNOSIS — I110 Hypertensive heart disease with heart failure: Secondary | ICD-10-CM | POA: Diagnosis not present

## 2020-10-13 DIAGNOSIS — W19XXXD Unspecified fall, subsequent encounter: Secondary | ICD-10-CM | POA: Diagnosis not present

## 2020-10-13 DIAGNOSIS — I5032 Chronic diastolic (congestive) heart failure: Secondary | ICD-10-CM | POA: Diagnosis not present

## 2020-10-13 DIAGNOSIS — S82832D Other fracture of upper and lower end of left fibula, subsequent encounter for closed fracture with routine healing: Secondary | ICD-10-CM | POA: Diagnosis not present

## 2020-10-16 ENCOUNTER — Ambulatory Visit: Payer: Medicare Other | Admitting: Family

## 2020-10-16 ENCOUNTER — Telehealth: Payer: Self-pay

## 2020-10-16 NOTE — Progress Notes (Incomplete)
Subjective:   By signing my name below, I, Lyric Barr-McArthur, attest that this documentation has been prepared under the direction and in the presence of Debbrah Alar, NP, 10/16/2020   Patient ID: Amanda Copeland, female    DOB: [birth year], 80 y.o.   MRN: [id number]  No chief complaint on file.   HPI Patient is in today for an office visit.  Health Maintenance Due  Topic Date Due   COLONOSCOPY (Pts 45-25yr Insurance coverage will need to be confirmed)  11/15/2017   INFLUENZA VACCINE  09/07/2020   COVID-19 Vaccine (5 - Booster for Pfizer series) 09/07/2020    Past Medical History:  Diagnosis Date   Arthritis    Asthma    childhood now returning   Back pain affecting pregnancy 11/02/2014   Benign essential tremor 10/28/2014   Bilateral dry eyes    [place]   [place]    Breast   Dry eyes    Encounter for Medicare annual wellness exam 05/22/2016   Essential hypertension, benign 10/28/2014   H/O measles    H/O mumps    History of chicken pox    Lumbago 11/02/2014   Mixed hyperlipidemia 10/28/2014   Obesity 10/28/2014   Stenosis of cervical spine     Past Surgical History:  Procedure Laterality Date   BACK SURGERY     BREAST REDUCTION SURGERY Left    CHOLECYSTECTOMY     EYE SURGERY  2008   b[place] cataracts [place], [place]    REDUCTION MAMMAPLASTY     REPLACEMENT TOTAL KNEE BILATERAL Bilateral    ROTATOR CUFF REPAIR Left     Family History  Problem Relation Age of Onset   Heart disease Mother    Heart attack Mother    Heart failure Mother    Hypertension Mother    Cancer Father        colon   Asthma Father    Parkinson's disease Father    Cancer Maternal Grandmother        uterine   Heart disease Maternal Grandfather    Obesity Daughter    Appendicitis Paternal Grandmother    Stroke Neg Hx     Social History   Socioeconomic History   Marital status: Married    Spouse name: Not on file   Number of  children: 1   Years of education: Not on file   Highest education level: Master's degree (e.g., MA, MS, MEng, MEd, MSW, MBA)  Occupational History   Occupation: retired    Comment: tPharmacist, 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)  Tobacco Use   Smoking status: Never   Smokeless tobacco: Never  Vaping Use   Vaping Use: Never used  Substance and Sexual Activity   Alcohol use: Yes    Alcohol/week: 0.0 standard drinks    Comment: two times a month   Drug use: No   Sexual activity: Not on file    Comment: lives with husband, moved [place], no dietary restrictions, retired sEducation officer, museum Other Topics Concern   Not on file  Social History Narrative   Pt lives with Spouse at home 1 daughter   Drinks coffee, and soda   Right handed   Social Determinants of Health   Financial Resource Strain: Not on file  Food Insecurity: Not on file  Transportation Needs: Not on file  Physical Activity: Not on file  Stress: Not on file  Social Connections: Not on file  Intimate Partner  Violence: Not on file    Outpatient Medications Prior to Visit  Medication Sig Dispense Refill   ezetimibe (ZETIA) 10 MG tablet TAKE 1 TABLET BY MOUTH EVERY DAY 90 tablet 1   albuterol (PROVENTIL HFA;VENTOLIN HFA) 108 (90 Base) MCG/ACT inhaler INHALE 2 PUFFS INTO THE LUNGS EVERY 4 (FOUR) HOURS AS NEEDED FOR WHEEZING OR SHORTNESS OF BREATH. 6.7 Inhaler 1   allopurinol (ZYLOPRIM) 100 MG tablet Take 2 tablets (200 mg total) by mouth daily. 180 tablet 1   AMBULATORY NON FORMULARY MEDICATION Rollator  Dx: Gait instability 1 Device 0   Ascorbic Acid (VITAMIN C PO) Take 1 tablet by mouth daily.      aspirin 81 MG tablet Take 81 mg by mouth daily.     colchicine 0.6 MG tablet Take 2 tabs at first and then 1 tab q 2 hours til pain relief or max of 6 tabs in 24 hours (Patient not taking: Reported on 07/23/2020) 6 tablet 1   DYMISTA 137-50 MCG/ACT SUSP 1-2 SPRAYS PER NOSTRIL TWICE DAILY FOR RUNNY NOSE 69 g 1   fluticasone (FLOVENT HFA) 110 MCG/ACT  inhaler 2 PUFFS TWICE DAILY TO PREVENT COUGHING OR WHEEZING. MAX INS WILL COVER IS A 30 DAY 36 each 1   furosemide (LASIX) 40 MG tablet TAKE 1 TABLET BY MOUTH EVERY DAY 90 tablet 3   KLOR-CON M20 20 MEQ tablet TAKE 1 TABLET BY MOUTH DAILY. KEEP UPCOMING APPOINTMENT (Patient not taking: Reported on 07/23/2020) 90 tablet 3   levothyroxine (SYNTHROID) 112 MCG tablet TAKE 1 TABLET BY MOUTH DAILY BEFORE BREAKFAST. 90 tablet 1   montelukast (SINGULAIR) 10 MG tablet Take 1 tablet (10 mg total) by mouth at bedtime. 90 tablet 1   Multiple Vitamins-Minerals (ICAPS AREDS 2) CAPS Take 1 capsule by mouth 2 (two) times daily.     PREVACID 30 MG capsule TAKE 1 CAPSULE BY MOUTH TWICE A DAY --MAX LIMIT 180 capsule 1   primidone (MYSOLINE) 50 MG tablet Take 1 tablet (50 mg total) by mouth at bedtime. 90 tablet 1   propranolol (INDERAL) 40 MG tablet Take 1 tablet (40 mg total) by mouth 2 (two) times daily. 180 tablet 1   Red Yeast Rice Extract (RED YEAST RICE PO) Take by mouth daily.     TIADYLT ER 180 MG 24 hr capsule TAKE 1 CAPSULE BY MOUTH EVERY DAY 90 capsule 1   No facility-administered medications prior to visit.    Allergies  Allergen Reactions   Amoxicillin Rash   Ampicillin Rash   Penicillins Rash   Statins Other (See Comments)    Per reports muscle aches and joint pain Per reports muscle aches and joint pain    ROS     Objective:    Physical Exam Constitutional:      General: She is not in acute distress.    Appearance: Normal appearance. She is not ill-appearing.  HENT:     Head: Normocephalic and atraumatic.     Right Ear: External ear normal.     Left Ear: External ear normal.  Eyes:     Extraocular Movements: Extraocular movements intact.     Pupils: Pupils are equal, round, and reactive to light.  Cardiovascular:     Rate and Rhythm: Normal rate and regular rhythm.     Heart sounds: Normal heart sounds. No murmur heard.   No gallop.  Pulmonary:     Effort: Pulmonary effort is  normal. No respiratory distress.     Breath sounds: Normal  breath sounds. No wheezing or rales.  Lymphadenopathy:     Cervical: No cervical adenopathy.  Skin:    General: Skin is warm and dry.  Neurological:     Mental Status: She is alert and oriented to person, place, and time.  Psychiatric:        Behavior: Behavior normal.        Judgment: Judgment normal.    There were no vitals taken for this visit. Wt Readings from Last 3 Encounters:  07/23/20 244 lb (110.7 kg)  06/17/20 243 lb (110.2 kg)  03/11/20 248 lb (112.5 kg)       Assessment & Plan:   Problem List Items Addressed This Visit   None   No orders of the defined types were placed in this encounter.   I, Debbrah Alar, NP, personally preformed the services described in this documentation.  All medical record entries made by the scribe were at my direction and in my presence.  I have reviewed the chart and discharge instructions (if applicable) and agree that the record reflects my personal performance and is accurate and complete. 10/16/2020   I,Lyric Barr-McArthur,acting as a Education administrator for Nance Pear, NP.,have documented all relevant documentation on the behalf of Nance Pear, NP,as directed by  Nance Pear, NP while in the presence of Nance Pear, NP.    Lyric Barr-McArthur

## 2020-10-16 NOTE — Telephone Encounter (Signed)
[facility] called in for verbals. OT

## 2020-10-20 DIAGNOSIS — S82842D Displaced bimalleolar fracture of left lower leg, subsequent encounter for closed fracture with routine healing: Secondary | ICD-10-CM | POA: Diagnosis not present

## 2020-10-20 DIAGNOSIS — I110 Hypertensive heart disease with heart failure: Secondary | ICD-10-CM | POA: Diagnosis not present

## 2020-10-20 DIAGNOSIS — S82832D Other fracture of upper and lower end of left fibula, subsequent encounter for closed fracture with routine healing: Secondary | ICD-10-CM | POA: Diagnosis not present

## 2020-10-20 DIAGNOSIS — J449 Chronic obstructive pulmonary disease, unspecified: Secondary | ICD-10-CM | POA: Diagnosis not present

## 2020-10-20 DIAGNOSIS — W19XXXD Unspecified fall, subsequent encounter: Secondary | ICD-10-CM | POA: Diagnosis not present

## 2020-10-20 DIAGNOSIS — I5032 Chronic diastolic (congestive) heart failure: Secondary | ICD-10-CM | POA: Diagnosis not present

## 2020-10-21 ENCOUNTER — Telehealth: Payer: Self-pay | Admitting: Family Medicine

## 2020-10-21 DIAGNOSIS — S82842D Displaced bimalleolar fracture of left lower leg, subsequent encounter for closed fracture with routine healing: Secondary | ICD-10-CM | POA: Diagnosis not present

## 2020-10-21 DIAGNOSIS — S82832D Other fracture of upper and lower end of left fibula, subsequent encounter for closed fracture with routine healing: Secondary | ICD-10-CM | POA: Diagnosis not present

## 2020-10-21 DIAGNOSIS — J449 Chronic obstructive pulmonary disease, unspecified: Secondary | ICD-10-CM | POA: Diagnosis not present

## 2020-10-21 DIAGNOSIS — I5032 Chronic diastolic (congestive) heart failure: Secondary | ICD-10-CM | POA: Diagnosis not present

## 2020-10-21 DIAGNOSIS — I110 Hypertensive heart disease with heart failure: Secondary | ICD-10-CM | POA: Diagnosis not present

## 2020-10-21 DIAGNOSIS — W19XXXD Unspecified fall, subsequent encounter: Secondary | ICD-10-CM | POA: Diagnosis not present

## 2020-10-21 NOTE — Telephone Encounter (Signed)
Mark from [facility] called for verbal orders. If any questions develop please contact him at [phone].

## 2020-10-22 NOTE — Telephone Encounter (Signed)
Lvm to call back

## 2020-10-23 DIAGNOSIS — I5032 Chronic diastolic (congestive) heart failure: Secondary | ICD-10-CM | POA: Diagnosis not present

## 2020-10-23 DIAGNOSIS — S82832D Other fracture of upper and lower end of left fibula, subsequent encounter for closed fracture with routine healing: Secondary | ICD-10-CM | POA: Diagnosis not present

## 2020-10-23 DIAGNOSIS — J449 Chronic obstructive pulmonary disease, unspecified: Secondary | ICD-10-CM | POA: Diagnosis not present

## 2020-10-23 DIAGNOSIS — I110 Hypertensive heart disease with heart failure: Secondary | ICD-10-CM | POA: Diagnosis not present

## 2020-10-23 DIAGNOSIS — W19XXXD Unspecified fall, subsequent encounter: Secondary | ICD-10-CM | POA: Diagnosis not present

## 2020-10-23 DIAGNOSIS — S82842D Displaced bimalleolar fracture of left lower leg, subsequent encounter for closed fracture with routine healing: Secondary | ICD-10-CM | POA: Diagnosis not present

## 2020-10-24 DIAGNOSIS — Z23 Encounter for immunization: Secondary | ICD-10-CM | POA: Diagnosis not present

## 2020-10-26 DIAGNOSIS — I110 Hypertensive heart disease with heart failure: Secondary | ICD-10-CM | POA: Diagnosis not present

## 2020-10-26 DIAGNOSIS — H35363 Drusen (degenerative) of macula, bilateral: Secondary | ICD-10-CM | POA: Diagnosis not present

## 2020-10-26 DIAGNOSIS — Z961 Presence of intraocular lens: Secondary | ICD-10-CM | POA: Diagnosis not present

## 2020-10-26 DIAGNOSIS — H353132 Nonexudative age-related macular degeneration, bilateral, intermediate dry stage: Secondary | ICD-10-CM | POA: Diagnosis not present

## 2020-10-26 DIAGNOSIS — H43812 Vitreous degeneration, left eye: Secondary | ICD-10-CM | POA: Diagnosis not present

## 2020-10-26 DIAGNOSIS — S82832D Other fracture of upper and lower end of left fibula, subsequent encounter for closed fracture with routine healing: Secondary | ICD-10-CM | POA: Diagnosis not present

## 2020-10-26 DIAGNOSIS — S82842D Displaced bimalleolar fracture of left lower leg, subsequent encounter for closed fracture with routine healing: Secondary | ICD-10-CM | POA: Diagnosis not present

## 2020-10-26 DIAGNOSIS — I5032 Chronic diastolic (congestive) heart failure: Secondary | ICD-10-CM | POA: Diagnosis not present

## 2020-10-26 DIAGNOSIS — H35453 Secondary pigmentary degeneration, bilateral: Secondary | ICD-10-CM | POA: Diagnosis not present

## 2020-10-26 DIAGNOSIS — W19XXXD Unspecified fall, subsequent encounter: Secondary | ICD-10-CM | POA: Diagnosis not present

## 2020-10-26 DIAGNOSIS — J449 Chronic obstructive pulmonary disease, unspecified: Secondary | ICD-10-CM | POA: Diagnosis not present

## 2020-10-28 ENCOUNTER — Telehealth: Payer: Self-pay | Admitting: *Deleted

## 2020-10-28 DIAGNOSIS — S82842D Displaced bimalleolar fracture of left lower leg, subsequent encounter for closed fracture with routine healing: Secondary | ICD-10-CM | POA: Diagnosis not present

## 2020-10-28 DIAGNOSIS — S82832D Other fracture of upper and lower end of left fibula, subsequent encounter for closed fracture with routine healing: Secondary | ICD-10-CM | POA: Diagnosis not present

## 2020-10-28 DIAGNOSIS — W19XXXD Unspecified fall, subsequent encounter: Secondary | ICD-10-CM | POA: Diagnosis not present

## 2020-10-28 DIAGNOSIS — I5032 Chronic diastolic (congestive) heart failure: Secondary | ICD-10-CM | POA: Diagnosis not present

## 2020-10-28 DIAGNOSIS — J449 Chronic obstructive pulmonary disease, unspecified: Secondary | ICD-10-CM | POA: Diagnosis not present

## 2020-10-28 DIAGNOSIS — I110 Hypertensive heart disease with heart failure: Secondary | ICD-10-CM | POA: Diagnosis not present

## 2020-10-28 NOTE — Telephone Encounter (Signed)
Patient notified

## 2020-10-28 NOTE — Telephone Encounter (Signed)
Mark from [facility] for physical therapy is calling because this pt has a low pulse running 45-48.  Patient states that she feels fine.  Does not feel faint, not sweaty.  Please advise.

## 2020-10-28 NOTE — Telephone Encounter (Signed)
Noted. If CP or SOB, lightheadedness, needs to go to ER. Ty.

## 2020-10-30 DIAGNOSIS — S82832D Other fracture of upper and lower end of left fibula, subsequent encounter for closed fracture with routine healing: Secondary | ICD-10-CM | POA: Diagnosis not present

## 2020-10-30 DIAGNOSIS — J449 Chronic obstructive pulmonary disease, unspecified: Secondary | ICD-10-CM | POA: Diagnosis not present

## 2020-10-30 DIAGNOSIS — S82842D Displaced bimalleolar fracture of left lower leg, subsequent encounter for closed fracture with routine healing: Secondary | ICD-10-CM | POA: Diagnosis not present

## 2020-10-30 DIAGNOSIS — I5032 Chronic diastolic (congestive) heart failure: Secondary | ICD-10-CM | POA: Diagnosis not present

## 2020-10-30 DIAGNOSIS — I110 Hypertensive heart disease with heart failure: Secondary | ICD-10-CM | POA: Diagnosis not present

## 2020-10-30 DIAGNOSIS — W19XXXD Unspecified fall, subsequent encounter: Secondary | ICD-10-CM | POA: Diagnosis not present

## 2020-11-10 ENCOUNTER — Ambulatory Visit (INDEPENDENT_AMBULATORY_CARE_PROVIDER_SITE_OTHER): Payer: Medicare Other | Admitting: Family Medicine

## 2020-11-10 ENCOUNTER — Other Ambulatory Visit: Payer: Self-pay

## 2020-11-10 ENCOUNTER — Encounter: Payer: Self-pay | Admitting: Family Medicine

## 2020-11-10 VITALS — BP 120/68 | HR 51 | Temp 97.5°F | Resp 16

## 2020-11-10 DIAGNOSIS — R739 Hyperglycemia, unspecified: Secondary | ICD-10-CM | POA: Diagnosis not present

## 2020-11-10 DIAGNOSIS — M79601 Pain in right arm: Secondary | ICD-10-CM | POA: Diagnosis not present

## 2020-11-10 DIAGNOSIS — R001 Bradycardia, unspecified: Secondary | ICD-10-CM

## 2020-11-10 DIAGNOSIS — E782 Mixed hyperlipidemia: Secondary | ICD-10-CM | POA: Diagnosis not present

## 2020-11-10 DIAGNOSIS — I10 Essential (primary) hypertension: Secondary | ICD-10-CM | POA: Diagnosis not present

## 2020-11-10 NOTE — Progress Notes (Signed)
Patient ID: Amanda Copeland, female    DOB: 06/09/1940  Age: 80 y.o. MRN: [id number]    Subjective:   Chief Complaint  Patient presents with   6 months follow up   Subjective   HPI Amanda Copeland presents for office visit today for follow up on HTN and left ankle surgery. She fell and broke her left ankle which she had to get surgery for. She has had her surgery on 08/19/2020 and took her 50 days to recover. Currently, she is doing well. Denies CP/palp/SOB/HA/congestion/fevers/GI or GU c/o. Taking meds as prescribed.   Her Ortho expressed concern regarding her low pulse. She reported pulses in the range of 48-49 bpm at home. She feels fatigue and reports a pulse of 51 bpm today. Her last cardiology visit with Dr. Irish Lack was in December 2021.  Her right arm gets painful at night. Pain started when she had a BP cuff put on her right arm, which she usually avoids putting pressure on since her mastectomy. She states that the pain gets better when she massages her right arm. Seems that her right arm is more swollen compared to left on certain days.     Review of Systems  Constitutional:  Negative for chills, fatigue and fever.  HENT:  Negative for congestion, rhinorrhea, sinus pressure, sinus pain and sore throat.   Eyes:  Negative for pain.  Respiratory:  Negative for cough and shortness of breath.   Cardiovascular:  Negative for chest pain, palpitations and leg swelling.  Gastrointestinal:  Negative for abdominal pain, blood in stool, diarrhea, nausea and vomiting.  Genitourinary:  Negative for decreased urine volume, flank pain, frequency, vaginal bleeding and vaginal discharge.  Musculoskeletal:  Negative for back pain.  Neurological:  Negative for headaches.   History Past Medical History:  Diagnosis Date   Arthritis    Asthma    childhood now returning   Back pain affecting pregnancy 11/02/2014   Benign essential tremor 10/28/2014   Bilateral dry eyes    Breast cancer ([place])     right   Cancer ([place])    Breast   Dry eyes    Encounter for Medicare annual wellness exam 05/22/2016   Essential hypertension, benign 10/28/2014   H/O measles    H/O mumps    History of chicken pox    Lumbago 11/02/2014   Mixed hyperlipidemia 10/28/2014   Obesity 10/28/2014   Stenosis of cervical spine     She has a past surgical history that includes Mastectomy (Right); Back surgery; Rotator cuff repair (Left); Replacement total knee bilateral (Bilateral); Breast reduction surgery (Left); Cholecystectomy; Eye surgery (2008); and Reduction mammaplasty.   Her family history includes Appendicitis in her paternal grandmother; Asthma in her father; Cancer in her father and maternal grandmother; Heart attack in her mother; Heart disease in her maternal grandfather and mother; Heart failure in her mother; Hypertension in her mother; Obesity in her daughter; Parkinson's disease in her father.She reports that she has never smoked. She has never used smokeless tobacco. She reports current alcohol use. She reports that she does not use drugs.  Current Outpatient Medications on File Prior to Visit  Medication Sig Dispense Refill   albuterol (PROVENTIL HFA;VENTOLIN HFA) 108 (90 Base) MCG/ACT inhaler INHALE 2 PUFFS INTO THE LUNGS EVERY 4 (FOUR) HOURS AS NEEDED FOR WHEEZING OR SHORTNESS OF BREATH. 6.7 Inhaler 1   allopurinol (ZYLOPRIM) 100 MG tablet Take 2 tablets (200 mg total) by mouth daily. 180 tablet 1   AMBULATORY NON FORMULARY  MEDICATION Rollator  Dx: Gait instability 1 Device 0   Ascorbic Acid (VITAMIN C PO) Take 1 tablet by mouth daily.      DYMISTA 137-50 MCG/ACT SUSP 1-2 SPRAYS PER NOSTRIL TWICE DAILY FOR RUNNY NOSE 69 g 1   ezetimibe (ZETIA) 10 MG tablet TAKE 1 TABLET BY MOUTH EVERY DAY 90 tablet 1   fluticasone (FLOVENT HFA) 110 MCG/ACT inhaler 2 PUFFS TWICE DAILY TO PREVENT COUGHING OR WHEEZING. MAX INS WILL COVER IS A 30 DAY 36 each 1   furosemide (LASIX) 40 MG tablet TAKE 1 TABLET BY MOUTH  EVERY DAY 90 tablet 3   KLOR-CON M20 20 MEQ tablet TAKE 1 TABLET BY MOUTH DAILY. KEEP UPCOMING APPOINTMENT 90 tablet 3   levothyroxine (SYNTHROID) 112 MCG tablet TAKE 1 TABLET BY MOUTH DAILY BEFORE BREAKFAST. 90 tablet 1   montelukast (SINGULAIR) 10 MG tablet Take 1 tablet (10 mg total) by mouth at bedtime. 90 tablet 1   Multiple Vitamins-Minerals (ICAPS AREDS 2) CAPS Take 1 capsule by mouth 2 (two) times daily.     PREVACID 30 MG capsule TAKE 1 CAPSULE BY MOUTH TWICE A DAY --MAX LIMIT 180 capsule 1   primidone (MYSOLINE) 50 MG tablet Take 1 tablet (50 mg total) by mouth at bedtime. 90 tablet 1   propranolol (INDERAL) 40 MG tablet Take 1 tablet (40 mg total) by mouth 2 (two) times daily. 180 tablet 1   Red Yeast Rice Extract (RED YEAST RICE PO) Take by mouth daily.     TIADYLT ER 180 MG 24 hr capsule TAKE 1 CAPSULE BY MOUTH EVERY DAY 90 capsule 1   aspirin 81 MG tablet Take 81 mg by mouth daily.     colchicine 0.6 MG tablet Take 2 tabs at first and then 1 tab q 2 hours til pain relief or max of 6 tabs in 24 hours (Patient not taking: No sig reported) 6 tablet 1   No current facility-administered medications on file prior to visit.     Objective:  Objective  Physical Exam Constitutional:      General: She is not in acute distress.    Appearance: Normal appearance. She is not ill-appearing or toxic-appearing.  HENT:     Head: Normocephalic and atraumatic.     Right Ear: Tympanic membrane, ear canal and external ear normal.     Left Ear: Tympanic membrane, ear canal and external ear normal.     Nose: No congestion or rhinorrhea.  Eyes:     Extraocular Movements: Extraocular movements intact.     Pupils: Pupils are equal, round, and reactive to light.  Cardiovascular:     Rate and Rhythm: Normal rate and regular rhythm.     Pulses: Normal pulses.     Heart sounds: Normal heart sounds. No murmur heard. Pulmonary:     Effort: Pulmonary effort is normal. No respiratory distress.      Breath sounds: Normal breath sounds. No wheezing, rhonchi or rales.  Abdominal:     General: Bowel sounds are normal.     Palpations: Abdomen is soft. There is no mass.     Tenderness: There is no abdominal tenderness. There is no guarding.     Hernia: No hernia is present.  Musculoskeletal:        General: Normal range of motion.     Cervical back: Normal range of motion and neck supple.  Skin:    General: Skin is warm and dry.  Neurological:     Mental Status:  She is alert and oriented to person, place, and time.  Psychiatric:        Behavior: Behavior normal.   BP 120/68   Pulse (!) 51   Temp (!) 97.5 F (36.4 C)   Resp 16   SpO2 98%  Wt Readings from Last 3 Encounters:  07/23/20 244 lb (110.7 kg)  06/17/20 243 lb (110.2 kg)  03/11/20 248 lb (112.5 kg)     Lab Results  Component Value Date   WBC 8.1 11/10/2020   HGB 13.1 11/10/2020   HCT 39.6 11/10/2020   PLT 337.0 11/10/2020   GLUCOSE 98 11/10/2020   CHOL 200 11/10/2020   TRIG 228.0 (H) 11/10/2020   HDL 50.60 11/10/2020   LDLDIRECT 124.0 11/10/2020   LDLCALC 76 05/29/2020   ALT 8 11/10/2020   AST 20 11/10/2020   NA 142 11/10/2020   K 3.8 11/10/2020   CL 102 11/10/2020   CREATININE 0.76 11/10/2020   BUN 14 11/10/2020   CO2 25 11/10/2020   TSH 2.33 11/10/2020   HGBA1C 5.7 11/10/2020    DG Shoulder Right  Result Date: 10/22/2019 CLINICAL DATA:  RIGHT shoulder pain, primary osteoarthritis EXAM: RIGHT SHOULDER - 2+ VIEW COMPARISON:  02/28/2019 FINDINGS: Osseous demineralization. Visualized RIGHT ribs intact. Degenerative changes RIGHT AC joint with joint space narrowing and spur formation. Additionally, advanced degenerative changes of the RIGHT glenohumeral joint with joint space narrowing and spur formation. No acute fracture, dislocation, or bone destruction. IMPRESSION: Osseous demineralization with degenerative changes of the RIGHT acromioclavicular and glenohumeral joints. Electronically Signed   By: Lavonia Dana M.D.   On: 10/22/2019 16:51   [place] LIMITED JOINT SPACE STRUCTURES UP RIGHT  Result Date: 11/14/2019 Aspiration/Injection Procedure Note Amanda Copeland Feb 01, 1941   Procedure: Injection Indications: Right shoulder pain   Procedure Details Consent: Risks of procedure as well as the alternatives and risks of each were explained to the (patient/caregiver).  Consent for procedure obtained. Time Out: Verified patient identification, verified procedure, site/side was marked, verified correct patient position, special equipment/implants available, medications/allergies/relevent history reviewed, required imaging and test results available.  Performed.  The area was cleaned with iodine and alcohol swabs.    The right glenohumeral joint was injected using 1 cc's of 40 mg Kenalog and 4 cc's of 0.25% bupivacaine with a 22  1/2" needle.  Ultrasound was used. Images were obtained in short views showing the injection.      A sterile dressing was applied.   Patient did tolerate procedure well.    Assessment & Plan:  Plan    No orders of the defined types were placed in this encounter.   Problem List Items Addressed This Visit     Essential hypertension, benign - Primary   Relevant Orders   Ambulatory referral to Cardiology   CBC (Completed)   Comprehensive metabolic panel (Completed)   TSH (Completed)   Mixed hyperlipidemia   Relevant Orders   Ambulatory referral to Cardiology   Lipid panel (Completed)   LDL cholesterol, direct (Completed)   Hyperglycemia    hgba1c acceptable, minimize simple carbs. Increase exercise as tolerated.       Relevant Orders   Ambulatory referral to Cardiology   Hemoglobin A1c (Completed)   Pain of right upper extremity    Check an ultrasound of Right Upper Extremity.       Relevant Orders   [place] UPPER EXTREMITY DUPLEX LIMITED (NOT INCLUDING WBI's)   Bradycardia    Patient reports BP down to 40s and  50s at times. Given her age and risk factors will refer to  cardiology for further evaluation      Relevant Orders   Ambulatory referral to Cardiology    Follow-up: Return in about 3 months (around 02/11/2021).  I, Suezanne Jacquet, acting as a scribe for [name], MD, have documented all relevent documentation on behalf of [name], MD, as directed by [name], MD while in the presence of [name], MD. DO:11/11/20.  I, [name], MD personally performed the services described in this documentation. All medical record entries made by the scribe were at my direction and in my presence. I have reviewed the chart and agree that the record reflects my personal performance and is accurate and complete

## 2020-11-10 NOTE — Patient Instructions (Signed)
Lipoma  A lipoma is a noncancerous (benign) tumor that is made up of fat cells. This is a very common type of soft-tissue growth. Lipomas are usually found under the skin (subcutaneous). They may occur in any tissue of the body that contains fat. Common areas forlipomas to appear include the back, arms, shoulders, buttocks, and thighs. Lipomas grow slowly, and they are usually painless. Most lipomas do not causeproblems and do not require treatment. What are the causes? The cause of this condition is not known. What increases the risk? You are more likely to develop this condition if: You are 40-60 years old. You have a family history of lipomas. What are the signs or symptoms? A lipoma usually appears as a small, round bump under the skin. In most cases, the lump will: Feel soft or rubbery. Not cause pain or other symptoms. However, if a lipoma is located in an area where it pushes on nerves, it canbecome painful or cause other symptoms. How is this diagnosed? A lipoma can usually be diagnosed with a physical exam. You may also have tests to confirm the diagnosis and to rule out other conditions. Tests may include: Imaging tests, such as a CT scan or an MRI. Removal of a tissue sample to be looked at under a microscope (biopsy). How is this treated? Treatment for this condition depends on the size of the lipoma and whether it is causing any symptoms. For small lipomas that are not causing problems, no treatment is needed. If a lipoma is bigger or it causes problems, surgery may be done to remove the lipoma. Lipomas can also be removed to improve appearance. Most often, the procedure is done after applying a medicine that numbs the area (local anesthetic). Liposuction may be done to reduce the size of the lipoma before it is removed through surgery, or it may be done to remove the lipoma. Lipomas are removed with this method in order to limit incision size and scarring. A liposuction tube is  inserted through a small incision into the lipoma, and the contents of the lipoma are removed through the tube with suction. Follow these instructions at home: Watch your lipoma for any changes. Keep all follow-up visits as told by your health care provider. This is important. Contact a health care provider if: Your lipoma becomes larger or hard. Your lipoma becomes painful, red, or increasingly swollen. These could be signs of infection or a more serious condition. Get help right away if: You develop tingling or numbness in an area near the lipoma. This could indicate that the lipoma is causing nerve damage. Summary A lipoma is a noncancerous tumor that is made up of fat cells. Most lipomas do not cause problems and do not require treatment. If a lipoma is bigger or it causes problems, surgery may be done to remove the lipoma. Contact a health care provider if your lipoma becomes larger or hard, or if it becomes painful, red, or increasingly swollen. Pain, redness, and swelling could be signs of infection or a more serious condition. This information is not intended to replace advice given to you by your health care provider. Make sure you discuss any questions you have with your healthcare provider. Document Revised: 09/10/2018 Document Reviewed: 09/10/2018 Elsevier Patient Education  2022 [employer]  

## 2020-11-11 DIAGNOSIS — M79601 Pain in right arm: Secondary | ICD-10-CM | POA: Insufficient documentation

## 2020-11-11 DIAGNOSIS — R001 Bradycardia, unspecified: Secondary | ICD-10-CM | POA: Insufficient documentation

## 2020-11-11 LAB — CBC
HCT: 39.6 % (ref 36.0–46.0)
Hemoglobin: 13.1 g/dL (ref 12.0–15.0)
MCHC: 33.1 g/dL (ref 30.0–36.0)
MCV: 93.9 fl (ref 78.0–100.0)
Platelets: 337 10*3/uL (ref 150.0–400.0)
RBC: 4.22 Mil/uL (ref 3.87–5.11)
RDW: 15.4 % (ref 11.5–15.5)
WBC: 8.1 10*3/uL (ref 4.0–10.5)

## 2020-11-11 LAB — TSH: TSH: 2.33 u[IU]/mL (ref 0.35–5.50)

## 2020-11-11 LAB — COMPREHENSIVE METABOLIC PANEL
ALT: 8 U/L (ref 0–35)
AST: 20 U/L (ref 0–37)
Albumin: 4 g/dL (ref 3.5–5.2)
Alkaline Phosphatase: 100 U/L (ref 39–117)
BUN: 14 mg/dL (ref 6–23)
CO2: 25 mEq/L (ref 19–32)
Calcium: 9.5 mg/dL (ref 8.4–10.5)
Chloride: 102 mEq/L (ref 96–112)
Creatinine, Ser: 0.76 mg/dL (ref 0.40–1.20)
GFR: 73.75 mL/min (ref >60.00)
Glucose, Bld: 98 mg/dL (ref 70–99)
Potassium: 3.8 mEq/L (ref 3.5–5.1)
Sodium: 142 mEq/L (ref 135–145)
Total Bilirubin: 0.3 mg/dL (ref 0.2–1.2)
Total Protein: 7 g/dL (ref 6.0–8.3)

## 2020-11-11 LAB — LIPID PANEL
Cholesterol: 200 mg/dL (ref 0–200)
HDL: 50.6 mg/dL (ref >39.00)
NonHDL: 149.24
Total CHOL/HDL Ratio: 4
Triglycerides: 228 mg/dL — ABNORMAL HIGH (ref 0.0–149.0)
VLDL: 45.6 mg/dL — ABNORMAL HIGH (ref 0.0–40.0)

## 2020-11-11 LAB — LDL CHOLESTEROL, DIRECT: Direct LDL: 124 mg/dL

## 2020-11-11 LAB — HEMOGLOBIN A1C: Hgb A1c MFr Bld: 5.7 % (ref 4.6–6.5)

## 2020-11-11 NOTE — Assessment & Plan Note (Signed)
Patient reports BP down to 40s and 50s at times. Given her age and risk factors will refer to cardiology for further evaluation

## 2020-11-11 NOTE — Assessment & Plan Note (Signed)
Check an ultrasound of Right Upper Extremity.

## 2020-11-11 NOTE — Assessment & Plan Note (Signed)
hgba1c acceptable, minimize simple carbs. Increase exercise as tolerated.  

## 2020-11-12 DIAGNOSIS — M25572 Pain in left ankle and joints of left foot: Secondary | ICD-10-CM | POA: Diagnosis not present

## 2020-11-12 NOTE — Progress Notes (Signed)
Cardiology Office Note   Date:  11/13/2020   ID:  Amanda Copeland, DOB Nov 15, 1940, MRN [id number]  PCP:  [name], MD    No chief complaint on file.  RF for CAD  Wt Readings from Last 3 Encounters:  07/23/20 244 lb (110.7 kg)  06/17/20 243 lb (110.2 kg)  03/11/20 248 lb (112.5 kg)       History of Present Illness: Amanda Copeland is a 80 y.o. female  Who has RF for CAD.  SHe has had a stress test several years ago that was normal.   She is intolerant to statins to muscle pain and fatigue.  SHe took atorvastatin.  She also tried another, maybe pravastatin but did not tolerate this.    It was recommended that she follow the DASH diet for weight loss.    In 2018, She saw pulmonary.  She was told her lungs are ok.   Lived in [place], then moved to the [place] area.    She never tried the DASH diet.     Chronic SHOB-visit with PMD in 2021:"  PFT was unremarkable, and no improvement with inhaler therapy." She also decline home sleep test.    Coronary CT in 2021 shosed: "Coronary calcium score of 26. This was 90 percentile for age and sex matched control.   2. Normal coronary origin with right dominance.   3. CAD-RADS 1. Minimal non-obstructive CAD (0-24%). Consider non-atherosclerotic causes of chest pain. Consider preventive therapy and risk factor modification."   2021 CPX test: "Conclusion: The interpretation of this test is limited due to submaximal effort during the exercise. Based on available data, exercise testing with gas exchange demonstrates mildly reduced functional capacity when compared to matched sedentary norms.Patient appears with ventilatory limitation related to her body habitus. There is no clear cardiopulmonary limitation. VE/VCO2 slope is elevated and suggestive of increased pulmonary pressures during exercise. There was also chronotropic incompetence most likely related to submaximal effort. "  [address] and broke her leg-did not pass out.  She was  trying to get into a pool in her legs buckled.  She had surgery, plate with nine screws.  She went to rehab for 50 days.  Uses a walker now.  Starting PT soon.    Denies : Chest pain. Dizziness. Leg edema. Nitroglycerin use. Orthopnea. Palpitations. Paroxysmal nocturnal dyspnea. Shortness of breath. Syncope.    Past Medical History:  Diagnosis Date   Arthritis    Asthma    childhood now returning   Back pain affecting pregnancy 11/02/2014   Benign essential tremor 10/28/2014   Bilateral dry eyes    Breast cancer ([place])    right   Cancer ([place])    Breast   Dry eyes    Encounter for Medicare annual wellness exam 05/22/2016   Essential hypertension, benign 10/28/2014   H/O measles    H/O mumps    History of chicken pox    Lumbago 11/02/2014   Mixed hyperlipidemia 10/28/2014   Obesity 10/28/2014   Stenosis of cervical spine     Past Surgical History:  Procedure Laterality Date   BACK SURGERY     BREAST REDUCTION SURGERY Left    CHOLECYSTECTOMY     EYE SURGERY  2008   b/l cataracts removed, in [place] Bilateral    ROTATOR CUFF REPAIR Left      Current Outpatient Medications  Medication Sig  Dispense Refill   albuterol (PROVENTIL HFA;VENTOLIN HFA) 108 (90 Base) MCG/ACT inhaler INHALE 2 PUFFS INTO THE LUNGS EVERY 4 (FOUR) HOURS AS NEEDED FOR WHEEZING OR SHORTNESS OF BREATH. 6.7 Inhaler 1   allopurinol (ZYLOPRIM) 100 MG tablet Take 2 tablets (200 mg total) by mouth daily. 180 tablet 1   AMBULATORY NON FORMULARY MEDICATION Rollator  Dx: Gait instability 1 Device 0   Ascorbic Acid (VITAMIN C PO) Take 1 tablet by mouth daily.      aspirin 81 MG tablet Take 81 mg by mouth daily.     colchicine 0.6 MG tablet Take 2 tabs at first and then 1 tab q 2 hours til pain relief or max of 6 tabs in 24 hours 6 tablet 1   DYMISTA 137-50 MCG/ACT SUSP 1-2 SPRAYS PER NOSTRIL TWICE DAILY FOR RUNNY NOSE 69 g 1    ezetimibe (ZETIA) 10 MG tablet TAKE 1 TABLET BY MOUTH EVERY DAY 90 tablet 1   fluticasone (FLOVENT HFA) 110 MCG/ACT inhaler 2 PUFFS TWICE DAILY TO PREVENT COUGHING OR WHEEZING. MAX INS WILL COVER IS A 30 DAY 36 each 1   furosemide (LASIX) 40 MG tablet TAKE 1 TABLET BY MOUTH EVERY DAY 90 tablet 3   KLOR-CON M20 20 MEQ tablet TAKE 1 TABLET BY MOUTH DAILY. KEEP UPCOMING APPOINTMENT 90 tablet 3   levothyroxine (SYNTHROID) 112 MCG tablet TAKE 1 TABLET BY MOUTH DAILY BEFORE BREAKFAST. 90 tablet 1   montelukast (SINGULAIR) 10 MG tablet Take 1 tablet (10 mg total) by mouth at bedtime. 90 tablet 1   Multiple Vitamins-Minerals (ICAPS AREDS 2) CAPS Take 1 capsule by mouth 2 (two) times daily.     PREVACID 30 MG capsule TAKE 1 CAPSULE BY MOUTH TWICE A DAY --MAX LIMIT 180 capsule 1   propranolol (INDERAL) 40 MG tablet Take 1 tablet (40 mg total) by mouth 2 (two) times daily. 180 tablet 1   Red Yeast Rice Extract (RED YEAST RICE PO) Take by mouth daily.     TIADYLT ER 180 MG 24 hr capsule TAKE 1 CAPSULE BY MOUTH EVERY DAY 90 capsule 1   primidone (MYSOLINE) 50 MG tablet Take 1 tablet (50 mg total) by mouth at bedtime. (Patient not taking: Reported on 11/13/2020) 90 tablet 1   No current facility-administered medications for this visit.    Allergies:   Amoxicillin, Ampicillin, Penicillins, and Statins    Social History:  The patient  reports that she has never smoked. She has never used smokeless tobacco. She reports current alcohol use. She reports that she does not use drugs.   Family History:  The patient's family history includes Appendicitis in her paternal grandmother; Asthma in her father; Cancer in her father and maternal grandmother; Heart attack in her mother; Heart disease in her maternal grandfather and mother; Heart failure in her mother; Hypertension in her mother; Obesity in her daughter; Parkinson's disease in her father.    ROS:  Please see the history of present illness.   Otherwise, review  of systems are positive for broken leg.   All other systems are reviewed and negative.    PHYSICAL EXAM: VS:  BP (!) 142/67   Pulse (!) 50   Ht 5\' 1"  (1.549 m)   SpO2 99%   BMI 46.10 kg/m  , BMI Body mass index is 46.1 kg/m. GEN: Well nourished, well developed, in no acute distress HEENT: normal Neck: no JVD, carotid bruits, or masses Cardiac: RRR; no murmurs, rubs, or gallops,no edema  Respiratory:  clear to auscultation bilaterally, normal work of breathing GI: soft, nontender, nondistended, + BS MS: no deformity or atrophy Skin: warm and dry, no rash Neuro:  Strength and sensation are intact Psych: euthymic mood, full affect   EKG:   The ekg ordered today demonstrates sinus bradycardia, no ST segment changes  Recent Labs: 11/10/2020: ALT 8; BUN 14; Creatinine, Ser 0.76; Hemoglobin 13.1; Platelets 337.0; Potassium 3.8; Sodium 142; TSH 2.33   Lipid Panel    Component Value Date/Time   CHOL 200 11/10/2020 1634   TRIG 228.0 (H) 11/10/2020 1634   HDL 50.60 11/10/2020 1634   CHOLHDL 4 11/10/2020 1634   VLDL 45.6 (H) 11/10/2020 1634   LDLCALC 76 05/29/2020 0846   LDLDIRECT 124.0 11/10/2020 1634     Other studies Reviewed: Additional studies/ records that were reviewed today with results demonstrating: labs reviewed.   ASSESSMENT AND PLAN:  Chronic diastolic heart failure: Appears euvolemic.  Blood pressure well controlled.  She is going to increase activity in the near future with physical therapy so this should help. Hypertensive heart disease: Stop Dilt. Start amlodipine 5 mg daily.  Bradycardia noted on ECG.  Stopping the diltiazem should help.  Would like to see heart rate and blood pressure numbers after this changes made.  She wants to stay on propranolol due to her tremor. Hyperlipidemia: LDL 76 in April 2022.  Total cholesterol 200 in October 2022 Dyspnea on exertion: Some degree of deconditioning noted on her prior CPX test.  We have spoken about increasing  activity in the past including with using stationary bike or pool.  With the setback of her broken leg, I encouraged her to gradually try to increase her activity.   Current medicines are reviewed at length with the patient today.  The patient concerns regarding her medicines were addressed.  The following changes have been made:  No change  Labs/ tests ordered today include:  No orders of the defined types were placed in this encounter.   Recommend 150 minutes/week of aerobic exercise Low fat, low carb, high fiber diet recommended  Disposition:   FU in 3-4 months   Signed, Larae Grooms, MD  11/13/2020 8:11 AM    [facility] [place], [place], [place]  [address] Phone: [phone]; Fax: [phone]

## 2020-11-13 ENCOUNTER — Ambulatory Visit (INDEPENDENT_AMBULATORY_CARE_PROVIDER_SITE_OTHER): Payer: Medicare Other | Admitting: Interventional Cardiology

## 2020-11-13 ENCOUNTER — Telehealth: Payer: Self-pay | Admitting: Family Medicine

## 2020-11-13 ENCOUNTER — Encounter: Payer: Self-pay | Admitting: Interventional Cardiology

## 2020-11-13 ENCOUNTER — Other Ambulatory Visit: Payer: Self-pay

## 2020-11-13 ENCOUNTER — Other Ambulatory Visit: Payer: Self-pay | Admitting: Family Medicine

## 2020-11-13 VITALS — BP 142/67 | HR 50 | Ht 61.0 in

## 2020-11-13 DIAGNOSIS — I5032 Chronic diastolic (congestive) heart failure: Secondary | ICD-10-CM

## 2020-11-13 DIAGNOSIS — E782 Mixed hyperlipidemia: Secondary | ICD-10-CM | POA: Diagnosis not present

## 2020-11-13 DIAGNOSIS — R251 Tremor, unspecified: Secondary | ICD-10-CM | POA: Diagnosis not present

## 2020-11-13 DIAGNOSIS — R0602 Shortness of breath: Secondary | ICD-10-CM | POA: Diagnosis not present

## 2020-11-13 DIAGNOSIS — M79601 Pain in right arm: Secondary | ICD-10-CM

## 2020-11-13 DIAGNOSIS — I110 Hypertensive heart disease with heart failure: Secondary | ICD-10-CM | POA: Diagnosis not present

## 2020-11-13 MED ORDER — AMLODIPINE BESYLATE 5 MG PO TABS: 5.0000 mg | ORAL_TABLET | Freq: Every day | ORAL | 3 refills | Status: DC

## 2020-11-13 NOTE — Patient Instructions (Signed)
Medication Instructions:  Your physician has recommended you make the following change in your medication:  Stop Diltiazem. Start Amlodipine 5 mg by mouth daily  *If you need a refill on your cardiac medications before your next appointment, please call your pharmacy*   Lab Work: none If you have labs (blood work) drawn today and your tests are completely normal, you will receive your results only by: [place] (if you have MyChart) OR A paper copy in the mail If you have any lab test that is abnormal or we need to change your treatment, we will call you to review the results.   Testing/Procedures: none   Follow-Up: At [facility], you and your health needs are our priority.  As part of our continuing mission to provide you with exceptional heart care, we have created designated Provider Care Teams.  These Care Teams include your primary Cardiologist (physician) and Advanced Practice Providers (APPs -  Physician Assistants and Nurse Practitioners) who all work together to provide you with the care you need, when you need it.  We recommend signing up for the patient portal called "MyChart".  Sign up information is provided on this After Visit Summary.  MyChart is used to connect with patients for Virtual Visits (Telemedicine).  Patients are able to view lab/test results, encounter notes, upcoming appointments, etc.  Non-urgent messages can be sent to your provider as well.   To learn more about what you can do with MyChart, go to [email or web address].    Your next appointment:   April 12, 2021 at 9:20  The format for your next appointment:   In Person  Provider:   You may see Larae Grooms, MD or one of the following Advanced Practice Providers on your designated Care Team:   Melina Copa, PA-C Ermalinda Barrios, PA-C   Other Instructions  Check blood pressure and heart rate at home.  Keep record of readings and send in through my chart or call to the office

## 2020-11-13 NOTE — Telephone Encounter (Signed)
Ultrasound information needed. Please advise.

## 2020-11-16 ENCOUNTER — Ambulatory Visit (HOSPITAL_BASED_OUTPATIENT_CLINIC_OR_DEPARTMENT_OTHER)
Admission: RE | Admit: 2020-11-16 | Discharge: 2020-11-16 | Disposition: A | Discharge status: Home / Self Care | Payer: Medicare Other | Source: Ambulatory Visit | Attending: Family Medicine | Admitting: Family Medicine

## 2020-11-16 ENCOUNTER — Other Ambulatory Visit: Payer: Self-pay

## 2020-11-16 DIAGNOSIS — M79601 Pain in right arm: Secondary | ICD-10-CM | POA: Diagnosis not present

## 2020-11-16 DIAGNOSIS — R590 Localized enlarged lymph nodes: Secondary | ICD-10-CM | POA: Diagnosis not present

## 2020-11-16 NOTE — Telephone Encounter (Signed)
done

## 2020-11-18 ENCOUNTER — Ambulatory Visit: Payer: Medicare Other | Attending: Sports Medicine | Admitting: Physical Therapy

## 2020-11-18 ENCOUNTER — Other Ambulatory Visit: Payer: Self-pay

## 2020-11-18 ENCOUNTER — Encounter: Payer: Self-pay | Admitting: Physical Therapy

## 2020-11-18 DIAGNOSIS — R262 Difficulty in walking, not elsewhere classified: Secondary | ICD-10-CM | POA: Insufficient documentation

## 2020-11-18 DIAGNOSIS — R2689 Other abnormalities of gait and mobility: Secondary | ICD-10-CM | POA: Insufficient documentation

## 2020-11-18 DIAGNOSIS — M25672 Stiffness of left ankle, not elsewhere classified: Secondary | ICD-10-CM | POA: Diagnosis not present

## 2020-11-18 DIAGNOSIS — R2681 Unsteadiness on feet: Secondary | ICD-10-CM | POA: Insufficient documentation

## 2020-11-18 DIAGNOSIS — M6281 Muscle weakness (generalized): Secondary | ICD-10-CM | POA: Diagnosis not present

## 2020-11-18 DIAGNOSIS — R600 Localized edema: Secondary | ICD-10-CM | POA: Diagnosis not present

## 2020-11-18 NOTE — Patient Instructions (Signed)
     Access Code: [email or web address] URL: [email or web address] Date: 11/18/2020 Prepared by: Annie Paras  Exercises Seated Gastroc Stretch with Strap - 3 x daily - 7 x weekly - 3 reps - 30 sec hold Seated Soleus Stretch with Strap - 3 x daily - 7 x weekly - 10 reps - 30 sec hold Long Sitting Calf Stretch with Strap - 2 x daily - 7 x weekly - 3 reps - 30 sec hold Seated Ankle Pumps on Table - 2-3 x daily - 7 x weekly - 2 sets - 20 reps Ankle Pumps in Elevation - 2-3 x daily - 7 x weekly - 2 sets - 10 reps - 3 sec hold Seated Ankle Circles - 2-3 x daily - 7 x weekly - 2 sets - 10 reps Seated Ankle Alphabet - 2-3 x daily - 7 x weekly - 2 sets  Patient Education Scar Massage

## 2020-11-18 NOTE — Therapy (Signed)
[facility] [address]  [place], [place], [address] Phone: [phone]   Fax:  [phone]  Physical Therapy Evaluation  Patient Details  Name: Amanda Copeland MRN: [id number] Date of Birth: 06-Amanda Copeland-1942 Referring Provider (PT): Becky Sax, MD   Encounter Date: 11/18/2020   PT End of Session - 11/18/20 0840     Visit Number 1    Number of Visits 16    Date for PT Re-Evaluation 01/13/21    Authorization Type Medicare & BCBS    PT Start Time 0840    PT Stop Time 0934    PT Time Calculation (min) 54 min    Activity Tolerance Patient tolerated treatment well    Behavior During Therapy [facility] for tasks assessed/performed             Past Medical History:  Diagnosis Date   Arthritis    Asthma    childhood now returning   Back pain affecting pregnancy 11/02/2014   Benign essential tremor 10/28/2014   Bilateral dry eyes    Breast cancer ([place])    right   Cancer ([place])    Breast   Dry eyes    Encounter for Medicare annual wellness exam 05/22/2016   Essential hypertension, benign 10/28/2014   H/O measles    H/O mumps    History of chicken pox    Lumbago 11/02/2014   Mixed hyperlipidemia 10/28/2014   Obesity 10/28/2014   Stenosis of cervical spine     Past Surgical History:  Procedure Laterality Date   BACK SURGERY     BREAST REDUCTION SURGERY Left    CHOLECYSTECTOMY     EYE SURGERY  2008   b/l cataracts removed, in [place] Bilateral    ROTATOR CUFF REPAIR Left     There were no vitals filed for this visit.    Subjective Assessment - 11/18/20 0846     Subjective On 08/12/20, Pt reports she fell while climbing down a ladder into a pool, folding her L ankle underneath her body weight, fracturing her L ankle (Fractures of the medial malleolus and distal fibula with mild  lateral displacement of the distal fibular fragment).  Sx on 7/13, then SNF for rehab until Labor Day weekend. HH PT briefly upon return home. Initially NWB then PWB a week after she went home, now WBAT. Currently part-time in walking boot for next 2 weeks, then boot to be discontinued.    Pertinent History hx of surgery for "ruptured disc", hx of breast cancerChronic LBP with h/o surgery for "ruptured disc", spinal stenosis, OA, B TKA, L RTC repair, chronic diastolic HF, HLD, moderate asthma, DOE, HTN, breast cancer, benign essential tremor, morbid obesity    Limitations Standing;Walking;House hold activities    Patient Stated Goals " to be able to walk w/o the RW and go on a famliy cruise to celebrate my 50th wedding anniversary leaving 12/18/20"    Currently in Pain? No/denies                [facility] PT Assessment - 11/18/20 0840       Assessment   Medical Diagnosis L ankle fracture    Referring Provider (PT) Becky Sax, MD    Onset Date/Surgical Date 08/19/20    Next MD Visit 12/10/20    Prior Therapy multiple episodes for LBP and balance deficits  Precautions   Precautions None    Required Braces or Orthoses Other Brace/Splint    Other Brace/Splint part-time in walking boot for next 2 weeks      Restrictions   Weight Bearing Restrictions Yes    LLE Weight Bearing Weight bearing as tolerated      Balance Screen   Has the patient fallen in the past 6 months Yes    How many times? 1 - at time of injury    Has the patient had a decrease in activity level because of a fear of falling?  No    Is the patient reluctant to leave their home because of a fear of falling?  No      Home Environment   Living Environment Private residence    Living Arrangements Spouse/significant other    Type of [place] Access Level entry    [place] - single point;Walker - 2 wheels;Walker - 4 wheels;Bedside commode;Shower seat;Wheelchair - Banker      Prior Function   Level of  Independence Independent    Vocation Retired    Leisure mostly sedentary, swimming/water walking during summer      Cognition   Overall Cognitive Status Within Functional Limits for tasks assessed      Observation/Other Assessments   Focus on Therapeutic Outcomes (FOTO)  Ankle = 31; predicted D/C FS = 50      Sensation   Light Touch Impaired by gross assessment    Additional Comments pt reports intermittent feeling that her L foot & great toe are asleep      ROM / Strength   AROM / PROM / Strength AROM;Strength      AROM   AROM Assessment Site Ankle    Right/Left Ankle Right;Left    Right Ankle Dorsiflexion 8    Right Ankle Plantar Flexion 54    Right Ankle Inversion 26    Right Ankle Eversion 14    Left Ankle Dorsiflexion -2    Left Ankle Plantar Flexion 45    Left Ankle Inversion 35    Left Ankle Eversion 31      Strength   Overall Strength Comments tested in sitting    Right Hip Flexion 4-/5    Right Hip Extension 3+/5    Right Hip External Rotation  3+/5    Right Hip Internal Rotation 4-/5    Right Hip ABduction 4/5    Right Hip ADduction 4-/5    Left Hip Flexion 4-/5    Left Hip Extension 3+/5    Left Hip External Rotation 3+/5    Left Hip Internal Rotation 4-/5    Left Hip ABduction 4/5    Left Hip ADduction 4-/5    Right Knee Flexion 4-/5    Right Knee Extension 4-/5    Left Knee Flexion 4-/5    Left Knee Extension 4-/5    Right Ankle Dorsiflexion 4+/5    Right Ankle Plantar Flexion 5/5   manual resistance   Right Ankle Inversion 4+/5    Right Ankle Eversion 4+/5    Left Ankle Dorsiflexion 4/5    Left Ankle Plantar Flexion 4-/5   manual resistance   Left Ankle Inversion 3+/5    Left Ankle Eversion 4-/5      Ambulation/Gait   Ambulation/Gait Yes    Ambulation/Gait Assistance 5: Supervision    Assistive device Rolling walker    Gait Pattern Step-through pattern;Decreased stride  length;Decreased dorsiflexion - right;Trunk flexed    Ambulation Surface  Level;Indoor    Gait velocity decreased                        Objective measurements completed on examination: See above findings.                PT Education - 11/18/20 0934     Education Details PT eval findings, anticipated POC & initial HEP - Access Code: [email or web address]; Instructed pt & husband in incisional scar massage taking care to avoid areas where scabbing still present    Person(s) Educated Patient;Spouse    Methods Explanation;Demonstration;Verbal cues;Tactile cues;Handout    Comprehension Verbalized understanding;Verbal cues required;Tactile cues required;Returned demonstration;Need further instruction              PT Short Term Goals - 11/18/20 0924       PT SHORT TERM GOAL #1   Title Patient will be independent with initial HEP    Status New    Target Date 12/02/20      PT SHORT TERM GOAL #2   Title Patient will demonstrate safe transfer technique and proper gait pattern with 4-wheel rolling walker    Status New    Target Date 12/09/20               PT Long Term Goals - 11/18/20 1359       PT LONG TERM GOAL #1   Title Patient will be independent with ongoing/advanced HEP for self-management at home    Status New    Target Date 01/13/21      PT LONG TERM GOAL #2   Title Patient to improve L ankle AROM to [facility] without pain provocation    Status New    Target Date 01/13/21      PT LONG TERM GOAL #3   Title Patient will demonstrate improved B LE strength to >/= 4/5 to 4+/5 for improved stability and ease of mobility    Status New    Target Date 01/13/21      PT LONG TERM GOAL #4   Title Patient will ambulate with normal gait pattern and good gait stability with or w/o LRAD    Status New    Target Date 01/13/21                    Plan - 11/18/20 0934     Clinical Impression Statement Amanda Copeland is an 80 y/o female who presents to OP PT s/p L ankle fracture sustained on 08/12/20 while attempting to descend a pool ladder  into the community swimming pool. Surgery delayed due to extensive edema, and she underwent ORIF on 08/19/20. She was initially NWB on L foot after surgery and went to SNF for rehab until she was able to initiate PWB on L foot. She is now WBAT on L and weaning from walking boot, wearing boot part-time for next 2 weeks. She continues to rely on a RW but would like to transition to a rollator and eventually be able to go w/o an AD. Deficits include moderate ankle edema, limited L ankle ROM, and limited L ankle as well as L>R proximal LE strength. Amanda Copeland will benefit from skilled PT to address deficits listed to restore functional L ankle ROM and B LE strength for improved balance and gait stability/tolerance to allow her to resume her normal daily routine and prepare to go on a 50th wedding anniversary cruise with  her family in November.    Personal Factors and Comorbidities Time since onset of injury/illness/exacerbation;Past/Current Experience;Comorbidity 3+;Age;Fitness    Comorbidities Chronic LBP with h/o surgery for "ruptured disc", spinal stenosis, OA, B TKA, L RTC repair, chronic diastolic HF, HLD, moderate asthma, DOE, HTN, breast cancer, benign essential tremor, morbid obesity    Examination-Activity Limitations Bathing;Bed Mobility;Bend;Caring for Others;Carry;Dressing;Lift;Locomotion Level;Reach Overhead;Sit;Sleep;Squat;Stairs;Stand;Toileting;Transfers    Examination-Participation Restrictions Cleaning;Community Activity;Interpersonal Relationship;Laundry;Meal Prep;Shop;Yard Work;Driving    Stability/Clinical Decision Making Stable/Uncomplicated    Clinical Decision Making Low    Rehab Potential Good    PT Frequency 2x / week    PT Duration 8 weeks    PT Treatment/Interventions ADLs/Self Care Home Management;Aquatic Therapy;Cryotherapy;Electrical Stimulation;Iontophoresis 4mg /ml Dexamethasone;Moist Heat;DME Instruction;Gait training;Stair training;Functional mobility training;Therapeutic  activities;Therapeutic exercise;Balance training;Neuromuscular re-education;Patient/family education;Manual techniques;Scar mobilization;Passive range of motion;Dry needling;Taping;Vasopneumatic Device;Joint Manipulations    PT Next Visit Plan Initiate gait training with rollator; review initial HEP; L ankle ROM/stretching; L ankle and proximal LE strengthening    PT Home Exercise Plan Access Code: [email or web address] (10/12)    Consulted and Agree with Plan of Care Patient;Family member/caregiver    Family Member Consulted husband - Amanda Copeland             Patient will benefit from skilled therapeutic intervention in order to improve the following deficits and impairments:  Abnormal gait, Cardiopulmonary status limiting activity, Decreased activity tolerance, Decreased balance, Decreased endurance, Decreased knowledge of use of DME, Decreased mobility, Decreased range of motion, Decreased safety awareness, Decreased scar mobility, Decreased strength, Difficulty walking, Increased edema, Increased fascial restricitons, Impaired perceived functional ability, Impaired flexibility, Impaired sensation, Improper body mechanics, Postural dysfunction, Pain  Visit Diagnosis: Stiffness of left ankle, not elsewhere classified  Muscle weakness (generalized)  Other abnormalities of gait and mobility  Difficulty in walking, not elsewhere classified  Localized edema  Unsteadiness on feet     Problem List Patient Active Problem List   Diagnosis Date Noted   Pain of right upper extremity 11/11/2020   Bradycardia 11/11/2020   Neck pain 03/11/2020   Tremor 03/01/2020   Hypothyroid 03/01/2020   Physical deconditioning 11/25/2019   At risk for obstructive sleep apnea 11/25/2019   Primary osteoarthritis of right shoulder 02/28/2019   Other forms of dyspnea 11/22/2018   Educated about COVID-19 virus infection 07/03/2018   Pain of right hip joint 03/18/2018   Chronic nonallergic rhinitis 12/20/2017   Cough,  persistent 12/20/2017   Right flank pain 12/10/2017   Fatigue 07/12/2017   Morbid obesity ([place]) 03/05/2017   Chronic diastolic heart failure ([place]) 11/29/2016   Dyspnea on exertion 10/06/2016   Asthma 10/06/2016   Encounter for Medicare annual wellness exam 05/22/2016   Hyperglycemia 08/07/2015   Allergic 08/07/2015   Gout 08/07/2015   Leukocytosis 08/07/2015   Lower abdominal pain 04/21/2015   Low back pain 11/02/2014   Esophageal reflux 10/28/2014   Essential hypertension, benign 10/28/2014   Mixed hyperlipidemia 10/28/2014   Benign essential tremor 10/28/2014   Bilateral dry eyes    Moderate persistent asthma    Arthritis    Cancer ([place])    History of chicken pox     Percival Spanish, PT 11/18/2020, 2:05 PM  [facility] [address]  [place], [place], [address] Phone: [phone]   Fax:  [phone]  Name: Amanda Copeland MRN: [id number] Date of Birth: 03-23-1940

## 2020-11-20 ENCOUNTER — Ambulatory Visit: Payer: Medicare Other

## 2020-11-20 ENCOUNTER — Other Ambulatory Visit: Payer: Self-pay

## 2020-11-20 DIAGNOSIS — M25672 Stiffness of left ankle, not elsewhere classified: Secondary | ICD-10-CM

## 2020-11-20 DIAGNOSIS — R600 Localized edema: Secondary | ICD-10-CM | POA: Diagnosis not present

## 2020-11-20 DIAGNOSIS — M6281 Muscle weakness (generalized): Secondary | ICD-10-CM | POA: Diagnosis not present

## 2020-11-20 DIAGNOSIS — R262 Difficulty in walking, not elsewhere classified: Secondary | ICD-10-CM

## 2020-11-20 DIAGNOSIS — R2689 Other abnormalities of gait and mobility: Secondary | ICD-10-CM | POA: Diagnosis not present

## 2020-11-20 DIAGNOSIS — R2681 Unsteadiness on feet: Secondary | ICD-10-CM

## 2020-11-20 NOTE — Therapy (Signed)
[facility] [address]  [place], [place], [address] Phone: [phone]   Fax:  [phone]  Physical Therapy Treatment  Patient Details  Name: Amanda Copeland MRN: [id number] Date of Birth: 07/13/1940 Referring Provider (PT): Becky Sax, MD   Encounter Date: 11/20/2020   PT End of Session - 11/20/20 1101     Visit Number 2    Number of Visits 16    Date for PT Re-Evaluation 01/13/21    Authorization Type Medicare & BCBS    PT Start Time 4401    PT Stop Time 1058    PT Time Calculation (min) 41 min    Activity Tolerance Patient tolerated treatment well;Patient limited by fatigue    Behavior During Therapy [facility] for tasks assessed/performed             Past Medical History:  Diagnosis Date   Arthritis    Asthma    childhood now returning   Back pain affecting pregnancy 11/02/2014   Benign essential tremor 10/28/2014   Bilateral dry eyes    Breast cancer ([place])    right   Cancer ([place])    Breast   Dry eyes    Encounter for Medicare annual wellness exam 05/22/2016   Essential hypertension, benign 10/28/2014   H/O measles    H/O mumps    History of chicken pox    Lumbago 11/02/2014   Mixed hyperlipidemia 10/28/2014   Obesity 10/28/2014   Stenosis of cervical spine     Past Surgical History:  Procedure Laterality Date   BACK SURGERY     BREAST REDUCTION SURGERY Left    CHOLECYSTECTOMY     EYE SURGERY  2008   b/l cataracts removed, in [place] Bilateral    ROTATOR CUFF REPAIR Left     There were no vitals filed for this visit.   Subjective Assessment - 11/20/20 1022     Subjective Pt reports that she always walks with her walker, does not have pain most of the time.    Pertinent History hx of surgery for "ruptured disc", hx of breast cancerChronic LBP with h/o surgery for "ruptured disc", spinal stenosis, OA, B  TKA, L RTC repair, chronic diastolic HF, HLD, moderate asthma, DOE, HTN, breast cancer, benign essential tremor, morbid obesity    Patient Stated Goals " to be able to walk w/o the RW and go on a famliy cruise to celebrate my 50th wedding anniversary leaving 12/18/20"    Currently in Pain? No/denies                               [facility] Adult PT Treatment/Exercise - 11/20/20 0001       Ambulation/Gait   Ambulation/Gait Yes    Ambulation/Gait Assistance 5: Supervision    Ambulation Distance (Feet) 130 Feet    Assistive device Rollator    Gait Pattern Step-through pattern;Decreased stride length;Decreased dorsiflexion - right;Trunk flexed      Exercises   Exercises Ankle      Knee/Hip Exercises: Standing   Hip Flexion Stengthening;Both;10 reps;Knee bent    Hip Flexion Limitations counter support    Hip Abduction Stengthening;Both;10 reps    Abduction Limitations counter support    Hip Extension Stengthening;Both;10 reps    Extension Limitations counter support      Knee/Hip  Exercises: Seated   Sit to Sand 5 reps;without UE support      Ankle Exercises: Stretches   Soleus Stretch 2 reps;30 seconds    Soleus Stretch Limitations with strap    Gastroc Stretch 2 reps;30 seconds    Gastroc Stretch Limitations seated and long sitting with strap      Ankle Exercises: Aerobic   Nustep L2x76min      Ankle Exercises: Seated   ABC's 1 rep    Ankle Circles/Pumps AROM;Left;20 reps    Ankle Circles/Pumps Limitations both ways    Other Seated Ankle Exercises L AP seated 20 reps                       PT Short Term Goals - 11/20/20 1203       PT SHORT TERM GOAL #1   Title Patient will be independent with initial HEP    Status On-going    Target Date 12/02/20      PT SHORT TERM GOAL #2   Title Patient will demonstrate safe transfer technique and proper gait pattern with 4-wheel rolling walker    Status On-going    Target Date 12/09/20                PT Long Term Goals - 11/20/20 1203       PT LONG TERM GOAL #1   Title Patient will be independent with ongoing/advanced HEP for self-management at home    Status On-going      PT LONG TERM GOAL #2   Title Patient to improve L ankle AROM to [facility] without pain provocation    Status On-going      PT LONG TERM GOAL #3   Title Patient will demonstrate improved B LE strength to >/= 4/5 to 4+/5 for improved stability and ease of mobility    Status On-going      PT LONG TERM GOAL #4   Title Patient will ambulate with normal gait pattern and good gait stability with or w/o LRAD    Status On-going                   Plan - 11/20/20 1101     Clinical Impression Statement Pt showed decreased endurance and WB exercise tolerance today. She tends to keep her L ankle everted and seems to have increased L genu valgus. Practiced gait training with rollator today, she did show a mild tendency to let the walker get too far in front of her but overall not showing signs of instability or being unsafe. Cues needed for proper foot positioning with stretches to avoid excessive ER and eversion at rest to help promote nuetral body alignment.    Personal Factors and Comorbidities Time since onset of injury/illness/exacerbation;Past/Current Experience;Comorbidity 3+;Age;Fitness    Comorbidities Chronic LBP with h/o surgery for "ruptured disc", spinal stenosis, OA, B TKA, L RTC repair, chronic diastolic HF, HLD, moderate asthma, DOE, HTN, breast cancer, benign essential tremor, morbid obesity    PT Frequency 2x / week    PT Duration 8 weeks    PT Treatment/Interventions ADLs/Self Care Home Management;Aquatic Therapy;Cryotherapy;Electrical Stimulation;Iontophoresis 4mg /ml Dexamethasone;Moist Heat;DME Instruction;Gait training;Stair training;Functional mobility training;Therapeutic activities;Therapeutic exercise;Balance training;Neuromuscular re-education;Patient/family education;Manual techniques;Scar  mobilization;Passive range of motion;Dry needling;Taping;Vasopneumatic Device;Joint Manipulations    PT Next Visit Plan gait training with rollator; review initial HEP; L ankle ROM/stretching; L ankle and proximal LE strengthening; work on standing endurance    PT Home Exercise Plan Access Code: [email or web address] (10/12)  Consulted and Agree with Plan of Care Patient             Patient will benefit from skilled therapeutic intervention in order to improve the following deficits and impairments:  Abnormal gait, Cardiopulmonary status limiting activity, Decreased activity tolerance, Decreased balance, Decreased endurance, Decreased knowledge of use of DME, Decreased mobility, Decreased range of motion, Decreased safety awareness, Decreased scar mobility, Decreased strength, Difficulty walking, Increased edema, Increased fascial restricitons, Impaired perceived functional ability, Impaired flexibility, Impaired sensation, Improper body mechanics, Postural dysfunction, Pain  Visit Diagnosis: Stiffness of left ankle, not elsewhere classified  Muscle weakness (generalized)  Other abnormalities of gait and mobility  Difficulty in walking, not elsewhere classified  Localized edema  Unsteadiness on feet     Problem List Patient Active Problem List   Diagnosis Date Noted   Pain of right upper extremity 11/11/2020   Bradycardia 11/11/2020   Neck pain 03/11/2020   Tremor 03/01/2020   Hypothyroid 03/01/2020   Physical deconditioning 11/25/2019   At risk for obstructive sleep apnea 11/25/2019   Primary osteoarthritis of right shoulder 02/28/2019   Other forms of dyspnea 11/22/2018   Educated about COVID-19 virus infection 07/03/2018   Pain of right hip joint 03/18/2018   Chronic nonallergic rhinitis 12/20/2017   Cough, persistent 12/20/2017   Right flank pain 12/10/2017   Fatigue 07/12/2017   Morbid obesity ([place]) 03/05/2017   Chronic diastolic heart failure ([place]) 11/29/2016   Dyspnea on  exertion 10/06/2016   Asthma 10/06/2016   Encounter for Medicare annual wellness exam 05/22/2016   Hyperglycemia 08/07/2015   Allergic 08/07/2015   Gout 08/07/2015   Leukocytosis 08/07/2015   Lower abdominal pain 04/21/2015   Low back pain 11/02/2014   Esophageal reflux 10/28/2014   Essential hypertension, benign 10/28/2014   Mixed hyperlipidemia 10/28/2014   Benign essential tremor 10/28/2014   Bilateral dry eyes    Moderate persistent asthma    Arthritis    Cancer ([place])    History of chicken pox     Artist Pais, PTA 11/20/2020, 12:05 PM  [facility] [address]  [place], [place], [address] Phone: [phone]   Fax:  [phone]  Name: Amanda Copeland MRN: [id number] Date of Birth: [birth year]

## 2020-11-24 ENCOUNTER — Other Ambulatory Visit: Payer: Self-pay

## 2020-11-24 ENCOUNTER — Ambulatory Visit: Payer: Medicare Other | Admitting: Physical Therapy

## 2020-11-24 DIAGNOSIS — R2689 Other abnormalities of gait and mobility: Secondary | ICD-10-CM

## 2020-11-24 DIAGNOSIS — M6281 Muscle weakness (generalized): Secondary | ICD-10-CM

## 2020-11-24 DIAGNOSIS — M25672 Stiffness of left ankle, not elsewhere classified: Secondary | ICD-10-CM

## 2020-11-24 DIAGNOSIS — R2681 Unsteadiness on feet: Secondary | ICD-10-CM

## 2020-11-24 DIAGNOSIS — R262 Difficulty in walking, not elsewhere classified: Secondary | ICD-10-CM

## 2020-11-24 DIAGNOSIS — R600 Localized edema: Secondary | ICD-10-CM | POA: Diagnosis not present

## 2020-11-24 NOTE — Patient Instructions (Signed)
    Access Code: [email or web address] URL: [email or web address] Date: 11/24/2020 Prepared by: Annie Paras  Exercises Seated Gastroc Stretch with Strap - 3 x daily - 7 x weekly - 3 reps - 30 sec hold Seated Soleus Stretch with Strap - 3 x daily - 7 x weekly - 10 reps - 30 sec hold Long Sitting Calf Stretch with Strap - 2 x daily - 7 x weekly - 3 reps - 30 sec hold Seated Ankle Pumps on Table - 2-3 x daily - 7 x weekly - 2 sets - 20 reps Ankle Pumps in Elevation - 2-3 x daily - 7 x weekly - 2 sets - 10 reps - 3 sec hold Seated Ankle Circles - 2-3 x daily - 7 x weekly - 2 sets - 10 reps Seated Ankle Alphabet - 2-3 x daily - 7 x weekly - 2 sets Seated Ankle Plantar Flexion with Resistance Loop - 1 x daily - 7 x weekly - 2 sets - 10 reps - 3 sec hold Seated Ankle Dorsiflexion with Resistance - 1 x daily - 7 x weekly - 2 sets - 10 reps - 3 sec hold Seated Ankle Eversion with Resistance - 1 x daily - 7 x weekly - 2 sets - 10 reps - 3 sec hold Seated Ankle Inversion with Anchored Resistance - 1 x daily - 7 x weekly - 2 sets - 10 reps - 3 sec hold  Patient Education Scar Massage

## 2020-11-24 NOTE — Therapy (Signed)
[facility] [address]  [place], [place], [address] Phone: [phone]   Fax:  [phone]  Physical Therapy Treatment  Patient Details  Name: Amanda Copeland MRN: [id number] Date of Birth: [birth year] Referring Provider (PT): Becky Sax, MD   Encounter Date: 11/24/2020   PT End of Session - 11/24/20 1621     Visit Number 3    Number of Visits 16    Date for PT Re-Evaluation 01/13/21    Authorization Type Medicare & BCBS    PT Start Time 1621    PT Stop Time 1702    PT Time Calculation (min) 41 min    Activity Tolerance Patient tolerated treatment well;Patient limited by fatigue    Behavior During Therapy [facility] for tasks assessed/performed             Past Medical History:  Diagnosis Date   Arthritis    Asthma    childhood now returning   Back pain affecting pregnancy 11/02/2014   Benign essential tremor 10/28/2014   Bilateral dry eyes    Breast cancer ([place])    right   Cancer ([place])    Breast   Dry eyes    Encounter for Medicare annual wellness exam 05/22/2016   Essential hypertension, benign 10/28/2014   H/O measles    H/O mumps    History of chicken pox    Lumbago 11/02/2014   Mixed hyperlipidemia 10/28/2014   Obesity 10/28/2014   Stenosis of cervical spine     Past Surgical History:  Procedure Laterality Date   BACK SURGERY     BREAST REDUCTION SURGERY Left    CHOLECYSTECTOMY     EYE SURGERY  2008   b/l cataracts removed, in [place] Bilateral    ROTATOR CUFF REPAIR Left     There were no vitals filed for this visit.   Subjective Assessment - 11/24/20 1629     Subjective Pt reports she has not tried her rollator at home but felt comfortable with it during last therapy session and denies need for further practice.    Pertinent History hx of surgery for "ruptured disc", hx of breast cancerChronic LBP with  h/o surgery for "ruptured disc", spinal stenosis, OA, B TKA, L RTC repair, chronic diastolic HF, HLD, moderate asthma, DOE, HTN, breast cancer, benign essential tremor, morbid obesity    Patient Stated Goals " to be able to walk w/o the RW and go on a famliy cruise to celebrate my 50th wedding anniversary leaving 12/18/20"    Currently in Pain? No/denies                               [facility] Adult PT Treatment/Exercise - 11/24/20 1621       Ambulation/Gait   Ambulation/Gait Assistance 5: Supervision    Ambulation Distance (Feet) 360 Feet    Assistive device Rolling walker    Gait Comments pt with L>R LE ER causing her to catch her L toes on the RW x 2 - pt states that the ER is her baseline      Exercises   Exercises Ankle      Knee/Hip Exercises: Standing   Hip Flexion Both;10 reps;Knee bent;Stengthening    Hip Flexion Limitations marching in place for weight shift   single UE support on counter  Hip Abduction Both;10 reps;Knee straight;Stengthening    Abduction Limitations counter support    Hip Extension --      Knee/Hip Exercises: Seated   Clamshell with TheraBand Red   15 x 3" - alt hip ER/ABD (R LE pulling stronger than L LE)   Other Seated Knee/Hip Exercises L/R red TB leg press x 15      Ankle Exercises: Stretches   Soleus Stretch 3 reps;30 seconds    Soleus Stretch Limitations L/R standing runner's stretch at counter    Gastroc Stretch 3 reps;30 seconds    Gastroc Stretch Limitations L/R standing runner's stretch at counter      Ankle Exercises: Seated   Heel Raises Both;20 reps;3 seconds    Toe Raise 20 reps;3 seconds      Ankle Exercises: Supine   T-Band Seated yellow TB L ankle 4-way x 15                     PT Education - 11/24/20 1700     Education Details HEP update - yellow TB 4-way ankle - Access Code: [email or web address]    Person(s) Educated Patient    Methods Explanation;Demonstration;Verbal cues;Handout    Comprehension  Verbalized understanding;Verbal cues required;Returned demonstration;Need further instruction              PT Short Term Goals - 11/24/20 1640       PT SHORT TERM GOAL #1   Title Patient will be independent with initial HEP    Status Achieved   11/24/20     PT SHORT TERM GOAL #2   Title Patient will demonstrate safe transfer technique and proper gait pattern with 4-wheel rolling walker    Status On-going    Target Date 12/09/20               PT Long Term Goals - 11/24/20 1642       PT LONG TERM GOAL #1   Title Patient will be independent with ongoing/advanced HEP for self-management at home    Status On-going    Target Date 01/13/21      PT LONG TERM GOAL #2   Title Patient to improve L ankle AROM to [facility] without pain provocation    Status On-going    Target Date 01/13/21      PT LONG TERM GOAL #3   Title Patient will demonstrate improved B LE strength to >/= 4/5 to 4+/5 for improved stability and ease of mobility    Status On-going    Target Date 01/13/21      PT LONG TERM GOAL #4   Title Patient will ambulate with normal gait pattern and good gait stability with or w/o LRAD    Status On-going    Target Date 01/13/21                   Plan - 11/24/20 1702     Clinical Impression Statement [name] reports good comfort with rollator after training during last session and denies need for further practice today. HEP going well but limited stretch noted with seated soleus stretch, therefore provided instruction in standing options for both gastroc and soleus stretches.  No concerns voiced with remainder of HEP and pt denies need for further review - STG #1 met. Able to progress L ankle strengthening with seated heel/toe raises and 4-way yellow TB resisted motion, but assistance needed to anchor band for resisted inversion - HEP updated with TB exercises. Continued limited tolerance/endurance with  standing/WB exercises today requiring intermittent seated breaks  although able to proceed with seated exercises during breaks.    Comorbidities Chronic LBP with h/o surgery for "ruptured disc", spinal stenosis, OA, B TKA, L RTC repair, chronic diastolic HF, HLD, moderate asthma, DOE, HTN, breast cancer, benign essential tremor, morbid obesity    Rehab Potential Good    PT Frequency 2x / week    PT Duration 8 weeks    PT Treatment/Interventions ADLs/Self Care Home Management;Aquatic Therapy;Cryotherapy;Electrical Stimulation;Iontophoresis 72m/ml Dexamethasone;Moist Heat;DME Instruction;Gait training;Stair training;Functional mobility training;Therapeutic activities;Therapeutic exercise;Balance training;Neuromuscular re-education;Patient/family education;Manual techniques;Scar mobilization;Passive range of motion;Dry needling;Taping;Vasopneumatic Device;Joint Manipulations    PT Next Visit Plan L ankle ROM/stretching; L ankle and proximal LE strengthening; work on standing endurance; HEP updates as indicated    PT Home Exercise Plan Access Code: [email or web address](10/12, updated 10/18)    Consulted and Agree with Plan of Care Patient             Patient will benefit from skilled therapeutic intervention in order to improve the following deficits and impairments:  Abnormal gait, Cardiopulmonary status limiting activity, Decreased activity tolerance, Decreased balance, Decreased endurance, Decreased knowledge of use of DME, Decreased mobility, Decreased range of motion, Decreased safety awareness, Decreased scar mobility, Decreased strength, Difficulty walking, Increased edema, Increased fascial restricitons, Impaired perceived functional ability, Impaired flexibility, Impaired sensation, Improper body mechanics, Postural dysfunction, Pain  Visit Diagnosis: Stiffness of left ankle, not elsewhere classified  Muscle weakness (generalized)  Other abnormalities of gait and mobility  Difficulty in walking, not elsewhere classified  Localized edema  Unsteadiness on  feet     Problem List Patient Active Problem List   Diagnosis Date Noted   Pain of right upper extremity 11/11/2020   Bradycardia 11/11/2020   Neck pain 03/11/2020   Tremor 03/01/2020   Hypothyroid 03/01/2020   Physical deconditioning 11/25/2019   At risk for obstructive sleep apnea 11/25/2019   Primary osteoarthritis of right shoulder 02/28/2019   Other forms of dyspnea 11/22/2018   Educated about COVID-19 virus infection 07/03/2018   Pain of right hip joint 03/18/2018   Chronic nonallergic rhinitis 12/20/2017   Cough, persistent 12/20/2017   Right flank pain 12/10/2017   Fatigue 07/12/2017   [place]bid [place] ([place] 03/05/2017   [place]ronic diastolic heart failure ([place] 11/29/2016   Dyspnea on exertion 10/06/2016   Asthma 10/06/2016   Encounter for Medicare annual wellness exam 05/22/2016   Hyperglycemia 08/07/2015   Allergic 08/07/2015   Gout 08/07/2015   Leukocytosis 08/07/2015   Lower abdominal pain 04/21/2015   Low back pain 11/02/2014   Esophageal reflux 10/28/2014   Essential hypertension, benign 10/28/2014   Mixed hyperlipidemia 10/28/2014   Benign essential tremor 10/28/2014   Bilateral dry eyes    Moderate persistent asthma    Arthritis    Cancer ([place]    History of chicken pox     JPercival Spanish PT 11/24/2020, 5:16 PM  [facility] [address] [place] [address]Phone: [phone]  Fax:  [phone] Name: JLeen TworekMRN: [id number] of Birth: 1Jun 02, 1942

## 2020-11-26 ENCOUNTER — Encounter: Payer: Self-pay | Admitting: Family Medicine

## 2020-11-26 ENCOUNTER — Ambulatory Visit: Payer: Self-pay

## 2020-11-26 ENCOUNTER — Ambulatory Visit (INDEPENDENT_AMBULATORY_CARE_PROVIDER_SITE_OTHER): Payer: Medicare Other | Admitting: Family Medicine

## 2020-11-26 VITALS — BP 128/72 | Ht 61.0 in | Wt 238.0 lb

## 2020-11-26 DIAGNOSIS — M19011 Primary osteoarthritis, right shoulder: Secondary | ICD-10-CM

## 2020-11-26 DIAGNOSIS — E559 Vitamin D deficiency, unspecified: Secondary | ICD-10-CM | POA: Diagnosis not present

## 2020-11-26 DIAGNOSIS — E2839 Other primary ovarian failure: Secondary | ICD-10-CM

## 2020-11-26 DIAGNOSIS — M8000XA Age-related osteoporosis with current pathological fracture, unspecified site, initial encounter for fracture: Secondary | ICD-10-CM | POA: Diagnosis not present

## 2020-11-26 MED ORDER — TRIAMCINOLONE ACETONIDE 40 MG/ML IJ SUSP
40.0000 mg | Freq: Once | INTRAMUSCULAR | Status: AC
  Administered 2020-11-26: 40 mg via INTRA_ARTICULAR

## 2020-11-26 NOTE — Progress Notes (Signed)
Amanda Copeland - 80 y.o. female MRN [id number]  Date of birth: April 14, 1940  SUBJECTIVE:  Including CC & ROS.  No chief complaint on file.   Amanda Copeland is a 80 y.o. female that is presenting with acute on chronic right shoulder pain.  Pain is occurring globally around the shoulder joint.  Has gotten improvement with previous injections.  Has had a recent left ankle fracture with surgery.   Review of Systems See HPI   HISTORY: Past Medical, Surgical, Social, and Family History Reviewed & Updated per EMR.   Pertinent Historical Findings include:  Past Medical History:  Diagnosis Date   Arthritis    Asthma    childhood now returning   Back pain affecting pregnancy 11/02/2014   Benign essential tremor 10/28/2014   Bilateral dry eyes    Breast cancer ([place])    right   Cancer ([place])    Breast   Dry eyes    Encounter for Medicare annual wellness exam 05/22/2016   Essential hypertension, benign 10/28/2014   H/O measles    H/O mumps    History of chicken pox    Lumbago 11/02/2014   Mixed hyperlipidemia 10/28/2014   Obesity 10/28/2014   Stenosis of cervical spine     Past Surgical History:  Procedure Laterality Date   BACK SURGERY     BREAST REDUCTION SURGERY Left    CHOLECYSTECTOMY     EYE SURGERY  2008   b/l cataracts removed, in [place] Right    REDUCTION MAMMAPLASTY     REPLACEMENT TOTAL KNEE BILATERAL Bilateral    ROTATOR CUFF REPAIR Left     Family History  Problem Relation Age of Onset   Heart disease Mother    Heart attack Mother    Heart failure Mother    Hypertension Mother    Cancer Father        colon   Asthma Father    Parkinson's disease Father    Cancer Maternal Grandmother        uterine   Heart disease Maternal Grandfather    Obesity Daughter    Appendicitis Paternal Grandmother    Stroke Neg Hx     Social History   Socioeconomic History   Marital status: Married    Spouse name: Not on file   Number of children: 1   Years of  education: Not on file   Highest education level: Master's degree (e.g., MA, MS, MEng, MEd, MSW, MBA)  Occupational History   Occupation: retired    Comment: Pharmacist, 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)  Tobacco Use   Smoking status: Never   Smokeless tobacco: Never  Vaping Use   Vaping Use: Never used  Substance and Sexual Activity   Alcohol use: Yes    Alcohol/week: 0.0 standard drinks    Comment: two times a month   Drug use: No   Sexual activity: Not on file    Comment: lives with husband, moved [place], no dietary restrictions, retired Education officer, museum  Other Topics Concern   Not on file  Social History Narrative   Pt lives with Spouse at home 1 daughter   Drinks coffee, and soda   Right handed   Social Determinants of Health   Financial Resource Strain: Not on file  Food Insecurity: Not on file  Transportation Needs: Not on file  Physical Activity: Not on file  Stress: Not on file  Social Connections: Not on file  Intimate Partner Violence: Not on file  PHYSICAL EXAM:  VS: BP 128/72 (BP Location: Left Arm, Patient Position: Sitting, Cuff Size: Large)   Ht 5\' 1"  (1.549 m)   Wt 238 lb (108 kg)   BMI 44.97 kg/m  Physical Exam Gen: NAD, alert, cooperative with exam, well-appearing    Aspiration/Injection Procedure Note Amanda Copeland 1940/07/15  Procedure: Injection Indications: Right shoulder pain  Procedure Details Consent: Risks of procedure as well as the alternatives and risks of each were explained to the (patient/caregiver).  Consent for procedure obtained. Time Out: Verified patient identification, verified procedure, site/side was marked, verified correct patient position, special equipment/implants available, medications/allergies/relevent history reviewed, required imaging and test results available.  Performed.  The area was cleaned with iodine and alcohol swabs.    The right glenohumeral joint was injected using 3 cc of 1% lidocaine on a 22-gauge 3-1/2 inch needle.  The  syringe was switched and a mixture containing 1 cc's of 40 mg Kenalog and 4 cc's of 0.25% bupivacaine was injected.  Ultrasound was used. Images were obtained in long views showing the injection.     A sterile dressing was applied.  Patient did tolerate procedure well.      ASSESSMENT & PLAN:   Primary osteoarthritis of right shoulder Acute on chronic in nature. -Counseled on home exercise therapy and supportive care. -Injection today.   Age-related osteoporosis with current pathological fracture Most recent bone density was showing osteopenia given recent fracture would categorize her into osteoporosis. -Check vitamin D. -Update bone density -Has been treated with Fosamax in the past.

## 2020-11-26 NOTE — Assessment & Plan Note (Signed)
Acute on chronic in nature. -Counseled on home exercise therapy and supportive care. -Injection today. 

## 2020-11-26 NOTE — Assessment & Plan Note (Signed)
Most recent bone density was showing osteopenia given recent fracture would categorize her into osteoporosis. -Check vitamin D. -Update bone density -Has been treated with Fosamax in the past.

## 2020-11-26 NOTE — Patient Instructions (Signed)
Good to see you Please use ice as needed  I will call with the results from today  Please schedule the bone density downstairs   Please send me a message in MyChart with any questions or updates.  Please see me back in 6-8 weeks.   --Dr. Raeford Razor

## 2020-11-27 ENCOUNTER — Telehealth: Payer: Self-pay | Admitting: Family Medicine

## 2020-11-27 LAB — VITAMIN D 25 HYDROXY (VIT D DEFICIENCY, FRACTURES): Vit D, 25-Hydroxy: 25.1 ng/mL — ABNORMAL LOW (ref 30.0–100.0)

## 2020-11-27 MED ORDER — VITAMIN D (ERGOCALCIFEROL) 1.25 MG (50000 UNIT) PO CAPS: 50000.0000 [IU] | ORAL_CAPSULE | ORAL | 0 refills | Status: DC

## 2020-11-27 NOTE — Telephone Encounter (Signed)
Informed of results. Sent in vitamin d.   Rosemarie Ax, MD [facility] 11/27/2020, 8:27 AM

## 2020-12-01 ENCOUNTER — Ambulatory Visit: Payer: Medicare Other | Admitting: Physical Therapy

## 2020-12-01 ENCOUNTER — Other Ambulatory Visit: Payer: Self-pay

## 2020-12-01 DIAGNOSIS — R2689 Other abnormalities of gait and mobility: Secondary | ICD-10-CM | POA: Diagnosis not present

## 2020-12-01 DIAGNOSIS — R600 Localized edema: Secondary | ICD-10-CM | POA: Diagnosis not present

## 2020-12-01 DIAGNOSIS — M25672 Stiffness of left ankle, not elsewhere classified: Secondary | ICD-10-CM

## 2020-12-01 DIAGNOSIS — R262 Difficulty in walking, not elsewhere classified: Secondary | ICD-10-CM

## 2020-12-01 DIAGNOSIS — R2681 Unsteadiness on feet: Secondary | ICD-10-CM

## 2020-12-01 DIAGNOSIS — M6281 Muscle weakness (generalized): Secondary | ICD-10-CM | POA: Diagnosis not present

## 2020-12-01 NOTE — Therapy (Signed)
[facility] [address]  [place], [place], [address] Phone: [phone]   Fax:  [phone]  Physical Therapy Treatment  Patient Details  Name: Amanda Copeland MRN: [id number] Date of Birth: [birth year] Referring Provider (PT): Becky Sax, MD   Encounter Date: 12/01/2020   PT End of Session - 12/01/20 1408     Visit Number 4    Number of Visits 16    Date for PT Re-Evaluation 01/13/21    Authorization Type Medicare & BCBS    PT Start Time 5188    PT Stop Time 4166    PT Time Calculation (min) 39 min    Activity Tolerance Patient tolerated treatment well;Patient limited by fatigue    Behavior During Therapy [facility] for tasks assessed/performed             Past Medical History:  Diagnosis Date   Arthritis    Asthma    childhood now returning   Back pain affecting pregnancy 11/02/2014   Benign essential tremor 10/28/2014   Bilateral dry eyes    Breast cancer ([place])    right   Cancer ([place])    Breast   Dry eyes    Encounter for Medicare annual wellness exam 05/22/2016   Essential hypertension, benign 10/28/2014   H/O measles    H/O mumps    History of chicken pox    Lumbago 11/02/2014   Mixed hyperlipidemia 10/28/2014   Obesity 10/28/2014   Stenosis of cervical spine     Past Surgical History:  Procedure Laterality Date   BACK SURGERY     BREAST REDUCTION SURGERY Left    CHOLECYSTECTOMY     EYE SURGERY  2008   b/l cataracts removed, in [place] Bilateral    ROTATOR CUFF REPAIR Left     There were no vitals filed for this visit.   Subjective Assessment - 12/01/20 1412     Subjective Pt reports she is using the rollator in place of the RW now but sometimes will try walking w/o AD at home using furniture or her husband for support. She states she has been looking for a soft ankle brace but has yet to find one that fits  or that she is able to don/doff independently.    Pertinent History hx of surgery for "ruptured disc", hx of breast cancerChronic LBP with h/o surgery for "ruptured disc", spinal stenosis, OA, B TKA, L RTC repair, chronic diastolic HF, HLD, moderate asthma, DOE, HTN, breast cancer, benign essential tremor, morbid obesity    Patient Stated Goals " to be able to walk w/o the RW and go on a famliy cruise to celebrate my 50th wedding anniversary leaving 12/18/20"    Currently in Pain? No/denies                               [facility] Adult PT Treatment/Exercise - 12/01/20 1408       Ambulation/Gait   Ambulation/Gait Assistance 5: Supervision    Ambulation/Gait Assistance Details cues for proper gait sequence with SPC    Ambulation Distance (Feet) 360 Feet   90 ft x 3 - 2 laps with rollator (2nd lap with rollator height lowered to improved posture & proximity) & 1 lap with [facility]   Assistive device 4-wheeled walker;Rollator;Straight cane    Gait Pattern  Step-through pattern;Decreased weight shift to left;Decreased stance time - left;Decreased step length - right    Ambulation Surface Level;Indoor    Gait Comments rollator height adjusted to promote better posture and rollator proximity      Exercises   Exercises Ankle      Ankle Exercises: Stretches   Soleus Stretch 2 reps;30 seconds    Soleus Stretch Limitations L/R standing runner's stretch at counter    Gastroc Stretch 2 reps;30 seconds    Gastroc Stretch Limitations L/R standing runner's stretch at counter      Ankle Exercises: Aerobic   Other Aerobic Walking 180 ft with rollator as alternative to machine warm-up      Ankle Exercises: Seated   Heel Raises Both;20 reps;3 seconds    Toe Raise 20 reps;3 seconds    BAPS Sitting;Level 2;15 reps    BAPS Limitations A/P & med/lat x 15; CW/CCW x 10    Other Seated Ankle Exercises B ankle inversion isometric into small green ball 10 x 3"                        PT Short Term Goals - 11/24/20 1640       PT SHORT TERM GOAL #1   Title Patient will be independent with initial HEP    Status Achieved   11/24/20     PT SHORT TERM GOAL #2   Title Patient will demonstrate safe transfer technique and proper gait pattern with 4-wheel rolling walker    Status On-going    Target Date 12/09/20               PT Long Term Goals - 11/24/20 1642       PT LONG TERM GOAL #1   Title Patient will be independent with ongoing/advanced HEP for self-management at home    Status On-going    Target Date 01/13/21      PT LONG TERM GOAL #2   Title Patient to improve L ankle AROM to [facility] without pain provocation    Status On-going    Target Date 01/13/21      PT LONG TERM GOAL #3   Title Patient will demonstrate improved B LE strength to >/= 4/5 to 4+/5 for improved stability and ease of mobility    Status On-going    Target Date 01/13/21      PT LONG TERM GOAL #4   Title Patient will ambulate with normal gait pattern and good gait stability with or w/o LRAD    Status On-going    Target Date 01/13/21                   Plan - 12/01/20 1447     Clinical Impression Statement [name] arrives to OT using her rollator today but having difficulty maintaining good proximity due rollator handles too high - rollator handles lowered 3 notches with pt reporting improved comfort and able to improve proximity with rollator. Gait also assessed with SPC as pt reporting she has attempted walking in home w/o AD using countertop/furniture for support and husband for support when going out - pt initially with some difficulty properly coordinating SPC with step pattern although improving with practice but more unsteady and with uneven step length with shorter stride on R due to decreased weight shift/stance on L. Encouraged pt to continue to use RW or rollator for now until able to demonstrate more symmetrical steady gait with SPC. Review and clarified standing gastroc  and  soleus stretches at pt request, providing cues for neutral foot alignment during stretches due to pt's normal tendency for LE ER. Continued to progress ankle strengthening as well as initiated proprioceptive training with introduction of BAPS board in sitting. Limited control with inversion/eversion on BAPS board and limited L ankle inversion with isometrics into ball but no pain reported.    Comorbidities Chronic LBP with h/o surgery for "ruptured disc", spinal stenosis, OA, B TKA, L RTC repair, chronic diastolic HF, HLD, moderate asthma, DOE, HTN, breast cancer, benign essential tremor, morbid obesity    Rehab Potential Good    PT Frequency 2x / week    PT Duration 8 weeks    PT Treatment/Interventions ADLs/Self Care Home Management;Aquatic Therapy;Cryotherapy;Electrical Stimulation;Iontophoresis 4mg /ml Dexamethasone;Moist Heat;DME Instruction;Gait training;Stair training;Functional mobility training;Therapeutic activities;Therapeutic exercise;Balance training;Neuromuscular re-education;Patient/family education;Manual techniques;Scar mobilization;Passive range of motion;Dry needling;Taping;Vasopneumatic Device;Joint Manipulations    PT Next Visit Plan L ankle ROM/stretching; L ankle and proximal LE strengthening; work on standing endurance; L ankle proprioceptive training; gait training with [place]; HEP updates as indicated in prep for upcoming cruise vacation    PT Home Exercise Plan Access Code: [email or web address] (10/12, updated 10/18)    Consulted and Agree with Plan of Care Patient             Patient will benefit from skilled therapeutic intervention in order to improve the following deficits and impairments:  Abnormal gait, Cardiopulmonary status limiting activity, Decreased activity tolerance, Decreased balance, Decreased endurance, Decreased knowledge of use of DME, Decreased mobility, Decreased range of motion, Decreased safety awareness, Decreased scar mobility, Decreased strength, Difficulty  walking, Increased edema, Increased fascial restricitons, Impaired perceived functional ability, Impaired flexibility, Impaired sensation, Improper body mechanics, Postural dysfunction, Pain  Visit Diagnosis: Stiffness of left ankle, not elsewhere classified  Muscle weakness (generalized)  Other abnormalities of gait and mobility  Difficulty in walking, not elsewhere classified  Localized edema  Unsteadiness on feet     Problem List Patient Active Problem List   Diagnosis Date Noted   Age-related osteoporosis with current pathological fracture 11/26/2020   Pain of right upper extremity 11/11/2020   Bradycardia 11/11/2020   Neck pain 03/11/2020   Tremor 03/01/2020   Hypothyroid 03/01/2020   Physical deconditioning 11/25/2019   At risk for obstructive sleep apnea 11/25/2019   Primary osteoarthritis of right shoulder 02/28/2019   Other forms of dyspnea 11/22/2018   Educated about COVID-19 virus infection 07/03/2018   Pain of right hip joint 03/18/2018   Chronic nonallergic rhinitis 12/20/2017   Cough, persistent 12/20/2017   Right flank pain 12/10/2017   Fatigue 07/12/2017   Morbid obesity ([place]) 03/05/2017   Chronic diastolic heart failure ([place]) 11/29/2016   Dyspnea on exertion 10/06/2016   Asthma 10/06/2016   Encounter for Medicare annual wellness exam 05/22/2016   Hyperglycemia 08/07/2015   Allergic 08/07/2015   Gout 08/07/2015   Leukocytosis 08/07/2015   Lower abdominal pain 04/21/2015   Low back pain 11/02/2014   Esophageal reflux 10/28/2014   Essential hypertension, benign 10/28/2014   Mixed hyperlipidemia 10/28/2014   Benign essential tremor 10/28/2014   Bilateral dry eyes    Moderate persistent asthma    Arthritis    Cancer ([place])    History of chicken pox     Percival Spanish, PT 12/01/2020, 8:56 PM  [facility] [address]  [place], [place], [address] Phone: [phone]   Fax:   [phone]  Name: Jalise Zawistowski MRN: [id number] Date  of Birth: November 10, 1940

## 2020-12-02 ENCOUNTER — Ambulatory Visit: Payer: Medicare Other

## 2020-12-02 DIAGNOSIS — R2681 Unsteadiness on feet: Secondary | ICD-10-CM | POA: Diagnosis not present

## 2020-12-02 DIAGNOSIS — R262 Difficulty in walking, not elsewhere classified: Secondary | ICD-10-CM | POA: Diagnosis not present

## 2020-12-02 DIAGNOSIS — M6281 Muscle weakness (generalized): Secondary | ICD-10-CM | POA: Diagnosis not present

## 2020-12-02 DIAGNOSIS — R600 Localized edema: Secondary | ICD-10-CM | POA: Diagnosis not present

## 2020-12-02 DIAGNOSIS — M25672 Stiffness of left ankle, not elsewhere classified: Secondary | ICD-10-CM | POA: Diagnosis not present

## 2020-12-02 DIAGNOSIS — R2689 Other abnormalities of gait and mobility: Secondary | ICD-10-CM | POA: Diagnosis not present

## 2020-12-02 NOTE — Therapy (Signed)
[facility] [address]  [place], [place], [address] Phone: [phone]   Fax:  [phone]  Physical Therapy Treatment  Patient Details  Name: Amanda Copeland MRN: [id number] Date of Birth: 12-11-1940 Referring Provider (PT): Becky Sax, MD   Encounter Date: 12/02/2020   PT End of Session - 12/02/20 1447     Visit Number 5    Number of Visits 16    Date for PT Re-Evaluation 01/13/21    Authorization Type Medicare & BCBS    PT Start Time 3546    PT Stop Time 1443    PT Time Calculation (min) 41 min    Activity Tolerance Patient tolerated treatment well;Patient limited by fatigue    Behavior During Therapy [facility] for tasks assessed/performed             Past Medical History:  Diagnosis Date   Arthritis    Asthma    childhood now returning   Back pain affecting pregnancy 11/02/2014   Benign essential tremor 10/28/2014   Bilateral dry eyes    Breast cancer ([place])    right   Cancer ([place])    Breast   Dry eyes    Encounter for Medicare annual wellness exam 05/22/2016   Essential hypertension, benign 10/28/2014   H/O measles    H/O mumps    History of chicken pox    Lumbago 11/02/2014   Mixed hyperlipidemia 10/28/2014   Obesity 10/28/2014   Stenosis of cervical spine     Past Surgical History:  Procedure Laterality Date   BACK SURGERY     BREAST REDUCTION SURGERY Left    CHOLECYSTECTOMY     EYE SURGERY  2008   b/l cataracts removed, in [place] Bilateral    ROTATOR CUFF REPAIR Left     There were no vitals filed for this visit.   Subjective Assessment - 12/02/20 1406     Subjective Pt reports that she is not having pain at the moment, notes that walking up to clinic is already a warm up.    Pertinent History hx of surgery for "ruptured disc", hx of breast cancerChronic LBP with h/o surgery for "ruptured disc",  spinal stenosis, OA, B TKA, L RTC repair, chronic diastolic HF, HLD, moderate asthma, DOE, HTN, breast cancer, benign essential tremor, morbid obesity    Patient Stated Goals " to be able to walk w/o the RW and go on a famliy cruise to celebrate my 50th wedding anniversary leaving 12/18/20"    Currently in Pain? No/denies                               [facility] Adult PT Treatment/Exercise - 12/02/20 0001       Ambulation/Gait   Ambulation/Gait Assistance 5: Supervision    Ambulation/Gait Assistance Details cues to increase heel strike and for upright posture    Ambulation Distance (Feet) 360 Feet    Assistive device Rollator    Gait Pattern Step-through pattern;Decreased weight shift to left;Decreased stance time - left;Decreased step length - right;Poor foot clearance - left;Poor foot clearance - right      Exercises   Exercises Ankle      Knee/Hip Exercises: Standing   Other Standing Knee Exercises alt toe clears on 9' stool 10 reps      Knee/Hip  Exercises: Seated   Long Arc Quad AROM;Both;10 reps;2 sets    Marching AROM;Both;10 reps    Marching Limitations cues for eccentric control      Ankle Exercises: Seated   Other Seated Ankle Exercises 4 way L ankle with yellow TB 2x10 each      Ankle Exercises: Stretches   Soleus Stretch 3 reps;30 seconds    Soleus Stretch Limitations seated with strap    Gastroc Stretch 3 reps;30 seconds    Gastroc Stretch Limitations seated with strap                       PT Short Term Goals - 11/24/20 1640       PT SHORT TERM GOAL #1   Title Patient will be independent with initial HEP    Status Achieved   11/24/20     PT SHORT TERM GOAL #2   Title Patient will demonstrate safe transfer technique and proper gait pattern with 4-wheel rolling walker    Status On-going    Target Date 12/09/20               PT Long Term Goals - 11/24/20 1642       PT LONG TERM GOAL #1   Title Patient will be  independent with ongoing/advanced HEP for self-management at home    Status On-going    Target Date 01/13/21      PT LONG TERM GOAL #2   Title Patient to improve L ankle AROM to [facility] without pain provocation    Status On-going    Target Date 01/13/21      PT LONG TERM GOAL #3   Title Patient will demonstrate improved B LE strength to >/= 4/5 to 4+/5 for improved stability and ease of mobility    Status On-going    Target Date 01/13/21      PT LONG TERM GOAL #4   Title Patient will ambulate with normal gait pattern and good gait stability with or w/o LRAD    Status On-going    Target Date 01/13/21                   Plan - 12/02/20 1447     Clinical Impression Statement Pt overall responded well to treatment. Walked around the clinic with rollator for warm up, provided cues to increase heel-toe pattern and upright posture during gait. She demonstrated increased difficulty with resisted IV today. Also shows increased L foot pronation and genu valgus. She had trouble foot clearance during gait, so worked on alt toe clears on step stool, she could also work more on gait training to improve endurance.    Personal Factors and Comorbidities Time since onset of injury/illness/exacerbation;Past/Current Experience;Comorbidity 3+;Age;Fitness    Comorbidities Chronic LBP with h/o surgery for "ruptured disc", spinal stenosis, OA, B TKA, L RTC repair, chronic diastolic HF, HLD, moderate asthma, DOE, HTN, breast cancer, benign essential tremor, morbid obesity    PT Frequency 2x / week    PT Duration 8 weeks    PT Treatment/Interventions ADLs/Self Care Home Management;Aquatic Therapy;Cryotherapy;Electrical Stimulation;Iontophoresis 4mg /ml Dexamethasone;Moist Heat;DME Instruction;Gait training;Stair training;Functional mobility training;Therapeutic activities;Therapeutic exercise;Balance training;Neuromuscular re-education;Patient/family education;Manual techniques;Scar mobilization;Passive range  of motion;Dry needling;Taping;Vasopneumatic Device;Joint Manipulations    PT Next Visit Plan L ankle ROM/stretching; L ankle and proximal LE strengthening; work on standing endurance; L ankle proprioceptive training; gait training with SPC; HEP updates as indicated in prep for upcoming cruise vacation    PT [place]  Access Code: [email or web address] (10/12, updated 10/18)    Consulted and Agree with Plan of Care Patient    Family Member Consulted husband - [name]             Patient will benefit from skilled therapeutic intervention in order to improve the following deficits and impairments:  Abnormal gait, Cardiopulmonary status limiting activity, Decreased activity tolerance, Decreased balance, Decreased endurance, Decreased knowledge of use of DME, Decreased mobility, Decreased range of motion, Decreased safety awareness, Decreased scar mobility, Decreased strength, Difficulty walking, Increased edema, Increased fascial restricitons, Impaired perceived functional ability, Impaired flexibility, Impaired sensation, Improper body mechanics, Postural dysfunction, Pain  Visit Diagnosis: Stiffness of left ankle, not elsewhere classified  Muscle weakness (generalized)  Other abnormalities of gait and mobility  Difficulty in walking, not elsewhere classified  Localized edema  Unsteadiness on feet     Problem List Patient Active Problem List   Diagnosis Date Noted   Age-related osteoporosis with current pathological fracture 11/26/2020   Pain of right upper extremity 11/11/2020   Bradycardia 11/11/2020   Neck pain 03/11/2020   Tremor 03/01/2020   Hypothyroid 03/01/2020   Physical deconditioning 11/25/2019   At risk for obstructive sleep apnea 11/25/2019   Primary osteoarthritis of right shoulder 02/28/2019   Other forms of dyspnea 11/22/2018   Educated about COVID-19 virus infection 07/03/2018   Pain of right hip joint 03/18/2018   Chronic nonallergic rhinitis 12/20/2017    Cough, persistent 12/20/2017   Right flank pain 12/10/2017   Fatigue 07/12/2017   Morbid obesity ([place]) 03/05/2017   Chronic diastolic heart failure ([place]) 11/29/2016   Dyspnea on exertion 10/06/2016   Asthma 10/06/2016   Encounter for Medicare annual wellness exam 05/22/2016   Hyperglycemia 08/07/2015   Allergic 08/07/2015   Gout 08/07/2015   Leukocytosis 08/07/2015   Lower abdominal pain 04/21/2015   Low back pain 11/02/2014   Esophageal reflux 10/28/2014   Essential hypertension, benign 10/28/2014   Mixed hyperlipidemia 10/28/2014   Benign essential tremor 10/28/2014   Bilateral dry eyes    Moderate persistent asthma    Arthritis    Cancer ([place])    History of chicken pox     Artist Pais, PTA 12/02/2020, 3:46 PM  [facility] [address]  [place], [place], [address] Phone: [phone]   Fax:  [phone]  Name: Amanda Copeland MRN: [id number] Date of Birth: [birth year]

## 2020-12-03 ENCOUNTER — Other Ambulatory Visit: Payer: Self-pay

## 2020-12-03 ENCOUNTER — Ambulatory Visit (HOSPITAL_BASED_OUTPATIENT_CLINIC_OR_DEPARTMENT_OTHER)
Admission: RE | Admit: 2020-12-03 | Discharge: 2020-12-03 | Disposition: A | Discharge status: Home / Self Care | Payer: Medicare Other | Source: Ambulatory Visit | Attending: Family Medicine | Admitting: Family Medicine

## 2020-12-03 DIAGNOSIS — M85852 Other specified disorders of bone density and structure, left thigh: Secondary | ICD-10-CM | POA: Diagnosis not present

## 2020-12-03 DIAGNOSIS — E2839 Other primary ovarian failure: Secondary | ICD-10-CM | POA: Diagnosis not present

## 2020-12-03 DIAGNOSIS — M8000XA Age-related osteoporosis with current pathological fracture, unspecified site, initial encounter for fracture: Secondary | ICD-10-CM | POA: Insufficient documentation

## 2020-12-04 ENCOUNTER — Telehealth: Payer: Self-pay | Admitting: Family Medicine

## 2020-12-04 NOTE — Telephone Encounter (Signed)
Informed of results. Proceed with prolia.   Rosemarie Ax, MD [facility] 12/04/2020, 1:05 PM

## 2020-12-08 ENCOUNTER — Other Ambulatory Visit: Payer: Self-pay

## 2020-12-08 ENCOUNTER — Ambulatory Visit: Payer: Medicare Other | Attending: Sports Medicine

## 2020-12-08 DIAGNOSIS — R2689 Other abnormalities of gait and mobility: Secondary | ICD-10-CM | POA: Diagnosis not present

## 2020-12-08 DIAGNOSIS — R600 Localized edema: Secondary | ICD-10-CM | POA: Insufficient documentation

## 2020-12-08 DIAGNOSIS — M6281 Muscle weakness (generalized): Secondary | ICD-10-CM | POA: Diagnosis not present

## 2020-12-08 DIAGNOSIS — R2681 Unsteadiness on feet: Secondary | ICD-10-CM | POA: Diagnosis not present

## 2020-12-08 DIAGNOSIS — R262 Difficulty in walking, not elsewhere classified: Secondary | ICD-10-CM | POA: Insufficient documentation

## 2020-12-08 DIAGNOSIS — M25672 Stiffness of left ankle, not elsewhere classified: Secondary | ICD-10-CM | POA: Insufficient documentation

## 2020-12-08 NOTE — Therapy (Signed)
[facility] [address]  [place], [place], [address] Phone: [phone]   Fax:  [phone]  Physical Therapy Treatment  Patient Details  Name: Amanda Copeland MRN: [id number] Date of Birth: Jul 10, 1940 Referring Provider (PT): Becky Sax, MD   Encounter Date: 12/08/2020   PT End of Session - 12/08/20 1615     Visit Number 6    Number of Visits 16    Date for PT Re-Evaluation 01/13/21    Authorization Type Medicare & BCBS    PT Start Time 1532    PT Stop Time 8185    PT Time Calculation (min) 41 min    Activity Tolerance Patient tolerated treatment well;Patient limited by fatigue    Behavior During Therapy [facility] for tasks assessed/performed             Past Medical History:  Diagnosis Date   Arthritis    Asthma    childhood now returning   Back pain affecting pregnancy 11/02/2014   Benign essential tremor 10/28/2014   Bilateral dry eyes    Breast cancer ([place])    right   Cancer ([place])    Breast   Dry eyes    Encounter for Medicare annual wellness exam 05/22/2016   Essential hypertension, benign 10/28/2014   H/O measles    H/O mumps    History of chicken pox    Lumbago 11/02/2014   Mixed hyperlipidemia 10/28/2014   Obesity 10/28/2014   Stenosis of cervical spine     Past Surgical History:  Procedure Laterality Date   BACK SURGERY     BREAST REDUCTION SURGERY Left    CHOLECYSTECTOMY     EYE SURGERY  2008   b/l cataracts removed, in [place] Bilateral    ROTATOR CUFF REPAIR Left     There were no vitals filed for this visit.   Subjective Assessment - 12/08/20 1536     Subjective Pt reports that she wants to try and get rid of her rollator, she tried walking w/o it for short distances at her house and did fine.    Pertinent History hx of surgery for "ruptured disc", hx of breast cancerChronic LBP with h/o surgery  for "ruptured disc", spinal stenosis, OA, B TKA, L RTC repair, chronic diastolic HF, HLD, moderate asthma, DOE, HTN, breast cancer, benign essential tremor, morbid obesity    Patient Stated Goals " to be able to walk w/o the RW and go on a famliy cruise to celebrate my 50th wedding anniversary leaving 12/18/20"    Currently in Pain? No/denies                               [facility] Adult PT Treatment/Exercise - 12/08/20 0001       Ambulation/Gait   Ambulation/Gait Assistance 5: Supervision    Ambulation Distance (Feet) 360 Feet    Assistive device Rollator    Gait Pattern Step-through pattern;Decreased weight shift to left;Decreased stance time - left;Decreased step length - right;Poor foot clearance - left;Poor foot clearance - right;Trunk flexed    Gait Comments for warm up, cue to keep close to rollator and posture      Exercises   Exercises Ankle      Knee/Hip Exercises: Standing   Hip Abduction Both;10 reps;Knee straight;Stengthening    Abduction Limitations counter  support      Knee/Hip Exercises: Seated   Marching Strengthening;Both;10 reps    Marching Limitations yellow TB      Ankle Exercises: Seated   Other Seated Ankle Exercises 4 way L ankle with red TB 10 each                 Balance Exercises - 12/08/20 0001       Balance Exercises: Standing   Standing Eyes Opened Foam/compliant surface;30 secs    Standing Eyes Closed Foam/compliant surface;30 secs;2 reps    Sidestepping 3 reps   along the length of the counter   Heel Raises Both;10 reps    Toe Raise Both;10 reps                  PT Short Term Goals - 12/08/20 1545       PT SHORT TERM GOAL #1   Title Patient will be independent with initial HEP    Status Achieved   11/24/20     PT SHORT TERM GOAL #2   Title Patient will demonstrate safe transfer technique and proper gait pattern with 4-wheel rolling walker    Status Achieved   12/08/20   Target Date 12/09/20                PT Long Term Goals - 11/24/20 1642       PT LONG TERM GOAL #1   Title Patient will be independent with ongoing/advanced HEP for self-management at home    Status On-going    Target Date 01/13/21      PT LONG TERM GOAL #2   Title Patient to improve L ankle AROM to [facility] without pain provocation    Status On-going    Target Date 01/13/21      PT LONG TERM GOAL #3   Title Patient will demonstrate improved B LE strength to >/= 4/5 to 4+/5 for improved stability and ease of mobility    Status On-going    Target Date 01/13/21      PT LONG TERM GOAL #4   Title Patient will ambulate with normal gait pattern and good gait stability with or w/o LRAD    Status On-going    Target Date 01/13/21                   Plan - 12/08/20 1621     Clinical Impression Statement Warmed up today with gait training using rollator for safety, provided cues for upright posture and keeping close proximity to AD but overall not showing any safety hazards and good gait pattern - STG 2 met. Progressed resisted ankle strengthening with red TB today, still mostly limited with inversion. Pt had some difficulty with standing balance with EC. She showed decreased lift with the toe raising due to balance deficits. Cues needed for upright posture during balance and cues to prevent ER during standing hip abduction. Overall she did well, no reports of pain during session.    Personal Factors and Comorbidities Time since onset of injury/illness/exacerbation;Past/Current Experience;Comorbidity 3+;Age;Fitness    Comorbidities Chronic LBP with h/o surgery for "ruptured disc", spinal stenosis, OA, B TKA, L RTC repair, chronic diastolic HF, HLD, moderate asthma, DOE, HTN, breast cancer, benign essential tremor, morbid obesity    PT Frequency 2x / week    PT Duration 8 weeks    PT Treatment/Interventions ADLs/Self Care Home Management;Aquatic Therapy;Cryotherapy;Electrical Stimulation;Iontophoresis 46m/ml  Dexamethasone;Moist Heat;DME Instruction;Gait training;Stair training;Functional mobility training;Therapeutic activities;Therapeutic exercise;Balance training;Neuromuscular re-education;Patient/family education;Manual  techniques;Scar mobilization;Passive range of motion;Dry needling;Taping;Vasopneumatic Device;Joint Manipulations    PT Next Visit Plan L ankle ROM/stretching; L ankle and proximal LE strengthening; work on standing endurance; L ankle proprioceptive training; gait training with [place]; HEP updates as indicated in prep for upcoming cruise vacation    PT Home Exercise Plan Access Code: [email or web address] (10/12, updated 10/18)    Consulted and Agree with Plan of Care Patient             Patient will benefit from skilled therapeutic intervention in order to improve the following deficits and impairments:  Abnormal gait, Cardiopulmonary status limiting activity, Decreased activity tolerance, Decreased balance, Decreased endurance, Decreased knowledge of use of DME, Decreased mobility, Decreased range of motion, Decreased safety awareness, Decreased scar mobility, Decreased strength, Difficulty walking, Increased edema, Increased fascial restricitons, Impaired perceived functional ability, Impaired flexibility, Impaired sensation, Improper body mechanics, Postural dysfunction, Pain  Visit Diagnosis: Stiffness of left ankle, not elsewhere classified  Muscle weakness (generalized)  Other abnormalities of gait and mobility  Difficulty in walking, not elsewhere classified  Localized edema  Unsteadiness on feet     Problem List Patient Active Problem List   Diagnosis Date Noted   Age-related osteoporosis with current pathological fracture 11/26/2020   Pain of right upper extremity 11/11/2020   Bradycardia 11/11/2020   Neck pain 03/11/2020   Tremor 03/01/2020   Hypothyroid 03/01/2020   Physical deconditioning 11/25/2019   At risk for obstructive sleep apnea 11/25/2019   Primary  osteoarthritis of right shoulder 02/28/2019   Other forms of dyspnea 11/22/2018   Educated about COVID-19 virus infection 07/03/2018   Pain of right hip joint 03/18/2018   Chronic nonallergic rhinitis 12/20/2017   Cough, persistent 12/20/2017   Right flank pain 12/10/2017   Fatigue 07/12/2017   Morbid obesity ([place]) 03/05/2017   Chronic diastolic heart failure ([place]) 11/29/2016   Dyspnea on exertion 10/06/2016   Asthma 10/06/2016   Encounter for Medicare annual wellness exam 05/22/2016   Hyperglycemia 08/07/2015   Allergic 08/07/2015   Gout 08/07/2015   Leukocytosis 08/07/2015   Lower abdominal pain 04/21/2015   Low back pain 11/02/2014   Esophageal reflux 10/28/2014   Essential hypertension, benign 10/28/2014   Mixed hyperlipidemia 10/28/2014   Benign essential tremor 10/28/2014   Bilateral dry eyes    Moderate persistent asthma    Arthritis    Cancer ([place])    History of chicken pox     Artist Pais, PTA 12/08/2020, 4:51 PM  [facility] [address]  [place], [place], [address] Phone: [phone]   Fax:  [phone]  Name: Amanda Copeland MRN: [id number] Date of Birth: 26-[name]-1941

## 2020-12-09 ENCOUNTER — Telehealth: Payer: Self-pay

## 2020-12-09 ENCOUNTER — Ambulatory Visit: Payer: Medicare Other

## 2020-12-09 DIAGNOSIS — R2681 Unsteadiness on feet: Secondary | ICD-10-CM | POA: Diagnosis not present

## 2020-12-09 DIAGNOSIS — R2689 Other abnormalities of gait and mobility: Secondary | ICD-10-CM

## 2020-12-09 DIAGNOSIS — M25672 Stiffness of left ankle, not elsewhere classified: Secondary | ICD-10-CM | POA: Diagnosis not present

## 2020-12-09 DIAGNOSIS — M6281 Muscle weakness (generalized): Secondary | ICD-10-CM | POA: Diagnosis not present

## 2020-12-09 DIAGNOSIS — R262 Difficulty in walking, not elsewhere classified: Secondary | ICD-10-CM

## 2020-12-09 DIAGNOSIS — R600 Localized edema: Secondary | ICD-10-CM | POA: Diagnosis not present

## 2020-12-09 NOTE — Therapy (Signed)
[facility] [address]  [place], [place], [address] Phone: [phone]   Fax:  [phone]  Physical Therapy Treatment  Patient Details  Name: Amanda Copeland MRN: [id number] Date of Birth: September 09, 1940 Referring Provider (PT): Becky Sax, MD   Encounter Date: 12/09/2020   PT End of Session - 12/09/20 1455     Visit Number 7    Number of Visits 16    Date for PT Re-Evaluation 01/13/21    Authorization Type Medicare & BCBS    PT Start Time 1403    PT Stop Time 2637    PT Time Calculation (min) 42 min    Activity Tolerance Patient tolerated treatment well;Patient limited by fatigue    Behavior During Therapy [facility] for tasks assessed/performed             Past Medical History:  Diagnosis Date   Arthritis    Asthma    childhood now returning   Back pain affecting pregnancy 11/02/2014   Benign essential tremor 10/28/2014   Bilateral dry eyes    Breast cancer ([place])    right   Cancer ([place])    Breast   Dry eyes    Encounter for Medicare annual wellness exam 05/22/2016   Essential hypertension, benign 10/28/2014   H/O measles    H/O mumps    History of chicken pox    Lumbago 11/02/2014   Mixed hyperlipidemia 10/28/2014   Obesity 10/28/2014   Stenosis of cervical spine     Past Surgical History:  Procedure Laterality Date   BACK SURGERY     BREAST REDUCTION SURGERY Left    CHOLECYSTECTOMY     EYE SURGERY  2008   b/l cataracts removed, in [place] Bilateral    ROTATOR CUFF REPAIR Left     There were no vitals filed for this visit.   Subjective Assessment - 12/09/20 1414     Subjective Pt doing well no new complaints as of now.    Pertinent History hx of surgery for "ruptured disc", hx of breast cancerChronic LBP with h/o surgery for "ruptured disc", spinal stenosis, OA, B TKA, L RTC repair, chronic diastolic HF, HLD,  moderate asthma, DOE, HTN, breast cancer, benign essential tremor, morbid obesity    Patient Stated Goals " to be able to walk w/o the RW and go on a famliy cruise to celebrate my 50th wedding anniversary leaving 12/18/20"    Currently in Pain? No/denies                [facility] PT Assessment - 12/09/20 0001       Assessment   Medical Diagnosis L ankle fracture    Referring Provider (PT) Becky Sax, MD    Onset Date/Surgical Date 08/19/20      AROM   Left Ankle Dorsiflexion 1    Left Ankle Plantar Flexion 61    Left Ankle Inversion 19    Left Ankle Eversion 38      Strength   Right Hip Flexion 4/5    Right Hip Extension 4/5    Right Hip ABduction 4+/5    Right Hip ADduction 4/5    Left Hip Flexion 4/5    Left Hip Extension 4/5    Left Hip ABduction 4+/5    Left Hip ADduction 4/5    Right Knee Flexion 4+/5    Right  Knee Extension 4/5    Left Knee Flexion 4+/5    Left Knee Extension 4/5    Left Ankle Dorsiflexion 4+/5    Left Ankle Plantar Flexion 4/5   manual resistance   Left Ankle Inversion 4-/5    Left Ankle Eversion 4+/5                           [facility] Adult PT Treatment/Exercise - 12/09/20 0001       Ambulation/Gait   Ambulation/Gait Assistance 5: Supervision    Ambulation Distance (Feet) 400 Feet    Assistive device Rollator    Gait Pattern Step-through pattern;Poor foot clearance - right;Poor foot clearance - left;Trunk flexed    Gait Comments for warm up cues for increased heel strike      Knee/Hip Exercises: Standing   Hip Flexion Both;10 reps;Knee bent;Stengthening    Hip Flexion Limitations marches with red TB      Knee/Hip Exercises: Seated   Long Arc Quad Strengthening;Both;10 reps    Long Arc Quad Limitations with ball squeeze    Marching Strengthening;Both;10 reps    Marching Limitations red TB                       PT Short Term Goals - 12/08/20 1545       PT SHORT TERM GOAL #1   Title Patient will be  independent with initial HEP    Status Achieved   11/24/20     PT SHORT TERM GOAL #2   Title Patient will demonstrate safe transfer technique and proper gait pattern with 4-wheel rolling walker    Status Achieved   12/08/20   Target Date 12/09/20               PT Long Term Goals - 12/09/20 1422       PT LONG TERM GOAL #1   Title Patient will be independent with ongoing/advanced HEP for self-management at home    Status On-going      PT LONG TERM GOAL #2   Title Patient to improve L ankle AROM to [facility] without pain provocation    Status On-going   improved still lacking DF and IV     PT LONG TERM GOAL #3   Title Patient will demonstrate improved B LE strength to >/= 4/5 to 4+/5 for improved stability and ease of mobility    Status On-going   hip/knee strength needs improvement     PT LONG TERM GOAL #4   Title Patient will ambulate with normal gait pattern and good gait stability with or w/o LRAD    Status On-going                   Plan - 12/09/20 1508     Clinical Impression Statement Pt required cues during gait training for heel strike and upright posture. She required a seated break for recovery halfway through. Pt has shown improvement with L ankle ROM but more limited with DF which could be the reason for her poor foot clearance during gait. B hips and knees need more strength, so I added strengthening exercises to her HEP. She also shows decreased endurance during gait noted with B foot drag and flexed posture. Progress is being made toward goals so far.    Personal Factors and Comorbidities Time since onset of injury/illness/exacerbation;Past/Current Experience;Comorbidity 3+;Age;Fitness    Comorbidities Chronic LBP with h/o surgery for "ruptured disc",  spinal stenosis, OA, B TKA, L RTC repair, chronic diastolic HF, HLD, moderate asthma, DOE, HTN, breast cancer, benign essential tremor, morbid obesity    PT Frequency 2x / week    PT Duration 8 weeks    PT  Treatment/Interventions ADLs/Self Care Home Management;Aquatic Therapy;Cryotherapy;Electrical Stimulation;Iontophoresis 4mg /ml Dexamethasone;Moist Heat;DME Instruction;Gait training;Stair training;Functional mobility training;Therapeutic activities;Therapeutic exercise;Balance training;Neuromuscular re-education;Patient/family education;Manual techniques;Scar mobilization;Passive range of motion;Dry needling;Taping;Vasopneumatic Device;Joint Manipulations    PT Next Visit Plan L ankle ROM more focus on DF; proximal LE strengthening; work on standing endurance; L ankle proprioceptive training; gait training with [facility]; HEP updates as indicated in prep for upcoming cruise vacation    PT Home Exercise Plan Access Code: [email or web address] (10/12, updated 10/18)    Consulted and Agree with Plan of Care Patient             Patient will benefit from skilled therapeutic intervention in order to improve the following deficits and impairments:  Abnormal gait, Cardiopulmonary status limiting activity, Decreased activity tolerance, Decreased balance, Decreased endurance, Decreased knowledge of use of DME, Decreased mobility, Decreased range of motion, Decreased safety awareness, Decreased scar mobility, Decreased strength, Difficulty walking, Increased edema, Increased fascial restricitons, Impaired perceived functional ability, Impaired flexibility, Impaired sensation, Improper body mechanics, Postural dysfunction, Pain  Visit Diagnosis: Stiffness of left ankle, not elsewhere classified  Muscle weakness (generalized)  Other abnormalities of gait and mobility  Difficulty in walking, not elsewhere classified  Localized edema  Unsteadiness on feet     Problem List Patient Active Problem List   Diagnosis Date Noted   Age-related osteoporosis with current pathological fracture 11/26/2020   Pain of right upper extremity 11/11/2020   Bradycardia 11/11/2020   Neck pain 03/11/2020   Tremor 03/01/2020    Hypothyroid 03/01/2020   Physical deconditioning 11/25/2019   At risk for obstructive sleep apnea 11/25/2019   Primary osteoarthritis of right shoulder 02/28/2019   Other forms of dyspnea 11/22/2018   Educated about COVID-19 virus infection 07/03/2018   Pain of right hip joint 03/18/2018   Chronic nonallergic rhinitis 12/20/2017   Cough, persistent 12/20/2017   Right flank pain 12/10/2017   Fatigue 07/12/2017   Morbid obesity ([place]) 03/05/2017   Chronic diastolic heart failure ([place]) 11/29/2016   Dyspnea on exertion 10/06/2016   Asthma 10/06/2016   Encounter for Medicare annual wellness exam 05/22/2016   Hyperglycemia 08/07/2015   Allergic 08/07/2015   Gout 08/07/2015   Leukocytosis 08/07/2015   Lower abdominal pain 04/21/2015   Low back pain 11/02/2014   Esophageal reflux 10/28/2014   Essential hypertension, benign 10/28/2014   Mixed hyperlipidemia 10/28/2014   Benign essential tremor 10/28/2014   Bilateral dry eyes    Moderate persistent asthma    Arthritis    Cancer ([place])    History of chicken pox     Artist Pais, PTA 12/09/2020, 4:39 PM  [facility] [address]  [place], [place], [address] Phone: [phone]   Fax:  [phone]  Name: Amanda Copeland MRN: [id number] Date of Birth: 09/01/1940

## 2020-12-09 NOTE — Telephone Encounter (Signed)
Needs VOB for Prolia

## 2020-12-09 NOTE — Progress Notes (Signed)
Assessment/Plan:    1.  Essential Tremor  -Patient doing well with propranolol, 40 mg twice per day.  She will continue on that. 2.  History of gabapentin induced myoclonus  -Off of gabapentin 3.  Follow-up 1 year. Subjective:   Amanda Copeland was seen today in follow up for essential tremor.  My previous records were reviewed prior to todays visit.  Patient with her husband who supplements the history.  Spoke with Dr. Halford Chessman after our last visit.  He felt it was safe to get patient back on propranolol, 40 mg twice per day.  She reports today that she is doing well.  As long as she takes it before 6:30, she doesn't feel that she has insomnia.  Medical records are reviewed since last visit.  She fell in July (she was trying [place] get [place] [employer] and she missed a step) and sustained an ankle fracture.  Surgery was required.  Has been in therapies for that (was in SNF for 50 days first).    Current prescribed movement disorder medications: Propranolol, 40 mg twice per day.   PREVIOUS MEDICATIONS:  propranolol ; metoprolol (didn't help); primidone (made too sleepy and didn't help at 100 mg daily); topamax (not helpful and SE); gabapentin (myoclonus)  ALLERGIES:   Allergies  Allergen Reactions   Amoxicillin Rash   Ampicillin Rash   Penicillins Rash   Statins Other (See Comments)    Per reports muscle aches and joint pain Per reports muscle aches and joint pain    CURRENT MEDICATIONS:  Outpatient Encounter Medications as of 12/10/2020  Medication Sig   albuterol (PROVENTIL HFA;VENTOLIN HFA) 108 (90 Base) MCG/ACT inhaler INHALE 2 PUFFS INTO THE LUNGS EVERY 4 (FOUR) HOURS AS NEEDED FOR WHEEZING OR SHORTNESS OF BREATH.   allopurinol (ZYLOPRIM) 100 MG tablet Take 2 tablets (200 mg total) by mouth daily.   AMBULATORY NON FORMULARY MEDICATION Rollator  Dx: Gait instability   amLODipine (NORVASC) 5 MG tablet Take 1 tablet (5 mg total) by mouth daily.   Ascorbic Acid (VITAMIN C PO) Take  1 tablet by mouth daily.    aspirin 81 MG tablet Take 81 mg by mouth daily.   colchicine 0.6 MG tablet Take 2 tabs at first and then 1 tab q 2 hours til pain relief or max of 6 tabs in 24 hours (Patient taking differently: as needed. Take 2 tabs at first and then 1 tab q 2 hours til pain relief or max of 6 tabs in 24 hours)   DYMISTA 137-50 MCG/ACT SUSP 1-2 SPRAYS PER NOSTRIL TWICE DAILY FOR RUNNY NOSE   ezetimibe (ZETIA) 10 MG tablet TAKE 1 TABLET BY MOUTH EVERY DAY   fluticasone (FLOVENT HFA) 110 MCG/ACT inhaler 2 PUFFS TWICE DAILY TO PREVENT COUGHING OR WHEEZING. MAX INS WILL COVER IS A 30 DAY   furosemide (LASIX) 40 MG tablet TAKE 1 TABLET BY MOUTH EVERY DAY   KLOR-CON M20 20 MEQ tablet TAKE 1 TABLET BY MOUTH DAILY. KEEP UPCOMING APPOINTMENT   levothyroxine (SYNTHROID) 112 MCG tablet TAKE 1 TABLET BY MOUTH DAILY BEFORE BREAKFAST.   Multiple Vitamins-Minerals (ICAPS AREDS 2) CAPS Take 1 capsule by mouth 2 (two) times daily.   PREVACID 30 MG capsule TAKE 1 CAPSULE BY MOUTH TWICE A DAY --MAX LIMIT   propranolol (INDERAL) 40 MG tablet Take 1 tablet (40 mg total) by mouth 2 (two) times daily.   Red Yeast Rice Extract (RED YEAST RICE PO) Take by mouth daily.  Vitamin D, Ergocalciferol, (DRISDOL) 1.25 MG (50000 UNIT) CAPS capsule Take 1 capsule (50,000 Units total) by mouth every 7 (seven) days. Take for 8 total doses(weeks)   [DISCONTINUED] montelukast (SINGULAIR) 10 MG tablet Take 1 tablet (10 mg total) by mouth at bedtime. (Patient not taking: Reported on 11/18/2020)   [DISCONTINUED] primidone (MYSOLINE) 50 MG tablet Take 1 tablet (50 mg total) by mouth at bedtime. (Patient not taking: No sig reported)   No facility-administered encounter medications on file as of 12/10/2020.     Objective:    PHYSICAL EXAMINATION:    VITALS:   Vitals:   12/10/20 1121  BP: 132/62  Pulse: 65  SpO2: 98%  Weight: 231 lb 6.4 oz (105 kg)  Height: 5' (1.524 m)     GEN:  The patient appears stated age  and is in NAD. HEENT:  Normocephalic, atraumatic.  The mucous membranes are moist. The superficial temporal arteries are without ropiness or tenderness.   Neurological examination:  Orientation: The patient is alert and oriented x3. Cranial nerves: There is good facial symmetry. The speech is fluent and clear. Soft palate rises symmetrically and there is no tongue deviation. Hearing is intact to conversational tone. Sensation: Sensation is intact to light touch throughout Motor: Strength is at least antigravity x4.  Movement examination: Tone: There is normal tone in the UE/LE Abnormal movements: There is no rest tremor.  There is no postural tremor.  There is no intention tremor.  She is able to draw Archimedes spirals without trouble. Coordination:  There is no decremation with RAM's Gait and Station: Not tested today. I have reviewed and interpreted the following labs independently   Chemistry      Component Value Date/Time   NA 142 11/10/2020 1634   NA 145 (H) 06/11/2019 1141   K 3.8 11/10/2020 1634   CL 102 11/10/2020 1634   CO2 25 11/10/2020 1634   BUN 14 11/10/2020 1634   BUN 10 06/11/2019 1141   CREATININE 0.76 11/10/2020 1634      Component Value Date/Time   CALCIUM 9.5 11/10/2020 1634   ALKPHOS 100 11/10/2020 1634   AST 20 11/10/2020 1634   ALT 8 11/10/2020 1634   BILITOT 0.3 11/10/2020 1634      Lab Results  Component Value Date   WBC 8.1 11/10/2020   HGB 13.1 11/10/2020   HCT 39.6 11/10/2020   MCV 93.9 11/10/2020   PLT 337.0 11/10/2020   Lab Results  Component Value Date   TSH 2.33 11/10/2020     Chemistry      Component Value Date/Time   NA 142 11/10/2020 1634   NA 145 (H) 06/11/2019 1141   K 3.8 11/10/2020 1634   CL 102 11/10/2020 1634   CO2 25 11/10/2020 1634   BUN 14 11/10/2020 1634   BUN 10 06/11/2019 1141   CREATININE 0.76 11/10/2020 1634      Component Value Date/Time   CALCIUM 9.5 11/10/2020 1634   ALKPHOS 100 11/10/2020 1634   AST  20 11/10/2020 1634   ALT 8 11/10/2020 1634   BILITOT 0.3 11/10/2020 1634       Cc:  [name], MD

## 2020-12-10 ENCOUNTER — Other Ambulatory Visit: Payer: Self-pay

## 2020-12-10 ENCOUNTER — Encounter: Payer: Self-pay | Admitting: Neurology

## 2020-12-10 ENCOUNTER — Other Ambulatory Visit: Payer: Self-pay | Admitting: Neurology

## 2020-12-10 ENCOUNTER — Ambulatory Visit (INDEPENDENT_AMBULATORY_CARE_PROVIDER_SITE_OTHER): Payer: Medicare Other | Admitting: Neurology

## 2020-12-10 VITALS — BP 132/62 | HR 65 | Ht 60.0 in | Wt 231.4 lb

## 2020-12-10 DIAGNOSIS — G250 Essential tremor: Secondary | ICD-10-CM

## 2020-12-10 DIAGNOSIS — M25572 Pain in left ankle and joints of left foot: Secondary | ICD-10-CM | POA: Diagnosis not present

## 2020-12-10 DIAGNOSIS — S82842A Displaced bimalleolar fracture of left lower leg, initial encounter for closed fracture: Secondary | ICD-10-CM | POA: Diagnosis not present

## 2020-12-11 DIAGNOSIS — J4530 Mild persistent asthma, uncomplicated: Secondary | ICD-10-CM | POA: Diagnosis not present

## 2020-12-11 DIAGNOSIS — J309 Allergic rhinitis, unspecified: Secondary | ICD-10-CM | POA: Diagnosis not present

## 2020-12-12 NOTE — Telephone Encounter (Signed)
Prolia VOB initiated via [email or web address]  Last OV:  Next OV:  Last Prolia inj: NEW start Next Prolia inj DUE:

## 2020-12-14 ENCOUNTER — Other Ambulatory Visit: Payer: Self-pay

## 2020-12-14 ENCOUNTER — Encounter: Payer: Self-pay | Admitting: Physical Therapy

## 2020-12-14 ENCOUNTER — Ambulatory Visit: Payer: Medicare Other | Admitting: Physical Therapy

## 2020-12-14 DIAGNOSIS — M6281 Muscle weakness (generalized): Secondary | ICD-10-CM | POA: Diagnosis not present

## 2020-12-14 DIAGNOSIS — R600 Localized edema: Secondary | ICD-10-CM | POA: Diagnosis not present

## 2020-12-14 DIAGNOSIS — R2689 Other abnormalities of gait and mobility: Secondary | ICD-10-CM

## 2020-12-14 DIAGNOSIS — R2681 Unsteadiness on feet: Secondary | ICD-10-CM

## 2020-12-14 DIAGNOSIS — R262 Difficulty in walking, not elsewhere classified: Secondary | ICD-10-CM

## 2020-12-14 DIAGNOSIS — M25672 Stiffness of left ankle, not elsewhere classified: Secondary | ICD-10-CM | POA: Diagnosis not present

## 2020-12-14 NOTE — Therapy (Signed)
[facility] [address]  [place], [place], [address] Phone: [phone]   Fax:  [phone]  Physical Therapy Treatment  Patient Details  Name: Amanda Copeland MRN: [id number] Date of Birth: 23-May-1940 Referring Provider (PT): Becky Sax, MD   Encounter Date: 12/14/2020   PT End of Session - 12/14/20 0845     Visit Number 8    Number of Visits 16    Date for PT Re-Evaluation 01/13/21    Authorization Type Medicare & BCBS    PT Start Time 0845    PT Stop Time 0927    PT Time Calculation (min) 42 min    Activity Tolerance Patient tolerated treatment well;Patient limited by fatigue    Behavior During Therapy [facility] for tasks assessed/performed             Past Medical History:  Diagnosis Date   Arthritis    Asthma    childhood now returning   Back pain affecting pregnancy 11/02/2014   Benign essential tremor 10/28/2014   Bilateral dry eyes    Breast cancer ([place])    right   Cancer ([place])    Breast   Dry eyes    Encounter for Medicare annual wellness exam 05/22/2016   Essential hypertension, benign 10/28/2014   H/O measles    H/O mumps    History of chicken pox    Lumbago 11/02/2014   Mixed hyperlipidemia 10/28/2014   Obesity 10/28/2014   Stenosis of cervical spine     Past Surgical History:  Procedure Laterality Date   BACK SURGERY     BREAST REDUCTION SURGERY Left    CHOLECYSTECTOMY     EYE SURGERY  2008   b/l cataracts removed, in [place] Bilateral    ROTATOR CUFF REPAIR Left     There were no vitals filed for this visit.   Subjective Assessment - 12/14/20 0855     Subjective Pt reports MD pleased with her progress and has released her but wants her to continue with PT working on weaning from walker to [facility].    Pertinent History hx of surgery for "ruptured disc", hx of breast cancerChronic LBP with h/o surgery  for "ruptured disc", spinal stenosis, OA, B TKA, L RTC repair, chronic diastolic HF, HLD, moderate asthma, DOE, HTN, breast cancer, benign essential tremor, morbid obesity    Patient Stated Goals " to be able to walk w/o the RW and go on a famliy cruise to celebrate my 50th wedding anniversary leaving 12/18/20"                               [facility] Adult PT Treatment/Exercise - 12/14/20 0845       Ambulation/Gait   Ambulation/Gait Assistance 5: Supervision    Ambulation Distance (Feet) 450 Feet   270 ft with rollator + 180 ft with [facility]   Assistive device Rollator;Straight cane    Gait Pattern Step-through pattern;Trunk flexed      Exercises   Exercises Ankle      Ankle Exercises: Standing   Vector Stance Left;5 reps    Vector Stance Limitations L SLS + 3-way fwd R toe tap to cones   L UE support on back of chair   SLS L SLS x 30 sec - B UE support on back of chair  Heel Raises Both;10 reps;2 seconds    Toe Raise 10 reps;2 seconds    Toe Raise Limitations limited lift on L      Ankle Exercises: Seated   BAPS Sitting;Level 2;10 reps   3 sets   BAPS Weights (lbs) 5# at P, post only at PM & AM    BAPS Limitations DF/PF maintaining level platform; CW/CCW w/o added weight (limited ability to touch down on lateral edge of platform    Other Seated Ankle Exercises L ankle PF 2 x 12 with green TB                       PT Short Term Goals - 12/08/20 1545       PT SHORT TERM GOAL #1   Title Patient will be independent with initial HEP    Status Achieved   11/24/20     PT SHORT TERM GOAL #2   Title Patient will demonstrate safe transfer technique and proper gait pattern with 4-wheel rolling walker    Status Achieved   12/08/20   Target Date 12/09/20               PT Long Term Goals - 12/09/20 1422       PT LONG TERM GOAL #1   Title Patient will be independent with ongoing/advanced HEP for self-management at home    Status On-going      PT  LONG TERM GOAL #2   Title Patient to improve L ankle AROM to [facility] without pain provocation    Status On-going   improved still lacking DF and IV     PT LONG TERM GOAL #3   Title Patient will demonstrate improved B LE strength to >/= 4/5 to 4+/5 for improved stability and ease of mobility    Status On-going   hip/knee strength needs improvement     PT LONG TERM GOAL #4   Title Patient will ambulate with normal gait pattern and good gait stability with or w/o LRAD    Status On-going                   Plan - 12/14/20 0928     Clinical Impression Statement [name] reports MD has released her but wants her to continue with PT to work on weaning from the rollator to a [facility]. She reports she is unsure of the [facility] as it feels like it wobbles on her - attempted gait training with [facility] for instruction in proper sequencing and encouraged pt to bring her QC with her to her next therapy session for further gait training. Pt reports HEP going well and denies need for review or progression other than increased resistance with PF strengthening, therefore green TB provided. Worked on Secretary/administrator and SLS stability with static stance as well as short-range vector 3-way toe tap to cones - good stability with static positioning but difficulty with more difficulty achieving adequate R LE lift to tap cones. [name] will be leaving for a 2-week cruise at the end of the week, therefore will ensure she is comfortable with mobility and HEP for while she is away next visit.    Comorbidities Chronic LBP with h/o surgery for "ruptured disc", spinal stenosis, OA, B TKA, L RTC repair, chronic diastolic HF, HLD, moderate asthma, DOE, HTN, breast cancer, benign essential tremor, morbid obesity    Rehab Potential Good    PT Frequency 2x / week    PT Duration 8 weeks  PT Treatment/Interventions ADLs/Self Care Home Management;Aquatic Therapy;Cryotherapy;Electrical Stimulation;Iontophoresis 4mg /ml Dexamethasone;Moist  Heat;DME Instruction;Gait training;Stair training;Functional mobility training;Therapeutic activities;Therapeutic exercise;Balance training;Neuromuscular re-education;Patient/family education;Manual techniques;Scar mobilization;Passive range of motion;Dry needling;Taping;Vasopneumatic Device;Joint Manipulations    PT Next Visit Plan L ankle ROM more focus on DF; proximal LE strengthening; work on standing endurance; L ankle proprioceptive training; gait training with SPC vs SBQC; HEP updates as indicated in prep for upcoming cruise vacation    PT Home Exercise Plan Access Code: [email or web address] (10/12, updated 10/18)    Consulted and Agree with Plan of Care Patient             Patient will benefit from skilled therapeutic intervention in order to improve the following deficits and impairments:  Abnormal gait, Cardiopulmonary status limiting activity, Decreased activity tolerance, Decreased balance, Decreased endurance, Decreased knowledge of use of DME, Decreased mobility, Decreased range of motion, Decreased safety awareness, Decreased scar mobility, Decreased strength, Difficulty walking, Increased edema, Increased fascial restricitons, Impaired perceived functional ability, Impaired flexibility, Impaired sensation, Improper body mechanics, Postural dysfunction, Pain  Visit Diagnosis: Stiffness of left ankle, not elsewhere classified  Muscle weakness (generalized)  Other abnormalities of gait and mobility  Difficulty in walking, not elsewhere classified  Localized edema  Unsteadiness on feet     Problem List Patient Active Problem List   Diagnosis Date Noted   Age-related osteoporosis with current pathological fracture 11/26/2020   Pain of right upper extremity 11/11/2020   Bradycardia 11/11/2020   Neck pain 03/11/2020   Tremor 03/01/2020   Hypothyroid 03/01/2020   Physical deconditioning 11/25/2019   At risk for obstructive sleep apnea 11/25/2019   Primary osteoarthritis of right  shoulder 02/28/2019   Other forms of dyspnea 11/22/2018   Educated about COVID-19 virus infection 07/03/2018   Pain of right hip joint 03/18/2018   Chronic nonallergic rhinitis 12/20/2017   Cough, persistent 12/20/2017   Right flank pain 12/10/2017   Fatigue 07/12/2017   Morbid obesity ([place]) 03/05/2017   Chronic diastolic heart failure ([place]) 11/29/2016   Dyspnea on exertion 10/06/2016   Asthma 10/06/2016   Encounter for Medicare annual wellness exam 05/22/2016   Hyperglycemia 08/07/2015   Allergic 08/07/2015   Gout 08/07/2015   Leukocytosis 08/07/2015   Lower abdominal pain 04/21/2015   Low back pain 11/02/2014   Esophageal reflux 10/28/2014   Essential hypertension, benign 10/28/2014   Mixed hyperlipidemia 10/28/2014   Benign essential tremor 10/28/2014   Bilateral dry eyes    Moderate persistent asthma    Arthritis    Cancer ([place])    History of chicken pox     Percival Spanish, PT 12/14/2020, 12:10 PM  [facility] [address]  [place], [place], [address] Phone: [phone]   Fax:  [phone]  Name: Jolisa Intriago MRN: [id number] Date of Birth: 1940/11/29

## 2020-12-16 ENCOUNTER — Other Ambulatory Visit: Payer: Self-pay

## 2020-12-16 ENCOUNTER — Ambulatory Visit: Payer: Medicare Other

## 2020-12-16 DIAGNOSIS — R600 Localized edema: Secondary | ICD-10-CM | POA: Diagnosis not present

## 2020-12-16 DIAGNOSIS — R2681 Unsteadiness on feet: Secondary | ICD-10-CM

## 2020-12-16 DIAGNOSIS — M25672 Stiffness of left ankle, not elsewhere classified: Secondary | ICD-10-CM

## 2020-12-16 DIAGNOSIS — R262 Difficulty in walking, not elsewhere classified: Secondary | ICD-10-CM | POA: Diagnosis not present

## 2020-12-16 DIAGNOSIS — R2689 Other abnormalities of gait and mobility: Secondary | ICD-10-CM | POA: Diagnosis not present

## 2020-12-16 DIAGNOSIS — M6281 Muscle weakness (generalized): Secondary | ICD-10-CM | POA: Diagnosis not present

## 2020-12-16 NOTE — Therapy (Signed)
[facility] [address]  [place], [place], [address] Phone: [phone]   Fax:  [phone]  Physical Therapy Treatment  Patient Details  Name: Amanda Copeland MRN: [id number] Date of Birth: 1940/08/14 Referring Provider (PT): Becky Sax, MD   Encounter Date: 12/16/2020   PT End of Session - 12/16/20 1358     Visit Number 9    Number of Visits 16    Date for PT Re-Evaluation 01/13/21    Authorization Type Medicare & BCBS    PT Start Time 1316    PT Stop Time 7035    PT Time Calculation (min) 42 min    Activity Tolerance Patient tolerated treatment well;Patient limited by fatigue    Behavior During Therapy [facility] for tasks assessed/performed             Past Medical History:  Diagnosis Date   Arthritis    Asthma    childhood now returning   Back pain affecting pregnancy 11/02/2014   Benign essential tremor 10/28/2014   Bilateral dry eyes    Breast cancer ([place])    right   Cancer ([place])    Breast   Dry eyes    Encounter for Medicare annual wellness exam 05/22/2016   Essential hypertension, benign 10/28/2014   H/O measles    H/O mumps    History of chicken pox    Lumbago 11/02/2014   Mixed hyperlipidemia 10/28/2014   Obesity 10/28/2014   Stenosis of cervical spine     Past Surgical History:  Procedure Laterality Date   BACK SURGERY     BREAST REDUCTION SURGERY Left    CHOLECYSTECTOMY     EYE SURGERY  2008   b/l cataracts removed, in [place] Bilateral    ROTATOR CUFF REPAIR Left     There were no vitals filed for this visit.   Subjective Assessment - 12/16/20 1321     Subjective Pt reports that her ankle was a bit sore after Monday but today it is feeling fine.    Pertinent History hx of surgery for "ruptured disc", hx of breast cancerChronic LBP with h/o surgery for "ruptured disc", spinal stenosis, OA, B TKA, L  RTC repair, chronic diastolic HF, HLD, moderate asthma, DOE, HTN, breast cancer, benign essential tremor, morbid obesity    Patient Stated Goals " to be able to walk w/o the RW and go on a famliy cruise to celebrate my 50th wedding anniversary leaving 12/18/20"    Currently in Pain? No/denies                               [facility] Adult PT Treatment/Exercise - 12/16/20 0001       Ambulation/Gait   Ambulation/Gait Assistance 5: Supervision    Ambulation Distance (Feet) 560 Feet    Assistive device Rollator;Straight cane    Gait Pattern Step-to pattern;Decreased hip/knee flexion - right;Decreased hip/knee flexion - left;Trunk flexed    Gait Comments review of gait training with quad cane      Exercises   Exercises Ankle      Knee/Hip Exercises: Standing   Forward Step Up Left;2 sets;10 reps;Hand Hold: 2;Step Height: 4"    Other Standing Knee Exercises alt toe clears on 9' stool with 1# weights 2x10 reps   very fatigued with this  Ankle Exercises: Seated   Ankle Circles/Pumps Strengthening;Left;20 reps    Ankle Circles/Pumps Limitations CW/CCW with 1# weight    Other Seated Ankle Exercises resisted DF with 1# weight 2x10                       PT Short Term Goals - 12/08/20 1545       PT SHORT TERM GOAL #1   Title Patient will be independent with initial HEP    Status Achieved   11/24/20     PT SHORT TERM GOAL #2   Title Patient will demonstrate safe transfer technique and proper gait pattern with 4-wheel rolling walker    Status Achieved   12/08/20   Target Date 12/09/20               PT Long Term Goals - 12/16/20 1405       PT LONG TERM GOAL #1   Title Patient will be independent with ongoing/advanced HEP for self-management at home    Status On-going      PT LONG TERM GOAL #2   Title Patient to improve L ankle AROM to [facility] without pain provocation    Status On-going   improved still lacking DF and IV     PT LONG TERM GOAL #3    Title Patient will demonstrate improved B LE strength to >/= 4/5 to 4+/5 for improved stability and ease of mobility    Status On-going   hip/knee strength needs improvement     PT LONG TERM GOAL #4   Title Patient will ambulate with normal gait pattern and good gait stability with or w/o LRAD    Status Partially Met                   Plan - 12/16/20 1406     Clinical Impression Statement Pt continues to deny questions/concerns with HEP. Reviewed gait with [facility] today, she did fine but needs more practice for more fluidity of gait, she was advised to keep ambulating with rollator for now. Still shows low endurance with standing ther ex, especially step ups. Cues needed for desired movements with ankle exercises and to improve foot clearance with toe clears. She shows good stability with her rollator but now needing more practice to build good stability with SPC. Good response to treatment.    Personal Factors and Comorbidities Time since onset of injury/illness/exacerbation;Past/Current Experience;Comorbidity 3+;Age;Fitness    Comorbidities Chronic LBP with h/o surgery for "ruptured disc", spinal stenosis, OA, B TKA, L RTC repair, chronic diastolic HF, HLD, moderate asthma, DOE, HTN, breast cancer, benign essential tremor, morbid obesity    PT Frequency 2x / week    PT Duration 8 weeks    PT Treatment/Interventions ADLs/Self Care Home Management;Aquatic Therapy;Cryotherapy;Electrical Stimulation;Iontophoresis 42m/ml Dexamethasone;Moist Heat;DME Instruction;Gait training;Stair training;Functional mobility training;Therapeutic activities;Therapeutic exercise;Balance training;Neuromuscular re-education;Patient/family education;Manual techniques;Scar mobilization;Passive range of motion;Dry needling;Taping;Vasopneumatic Device;Joint Manipulations    PT Next Visit Plan L ankle ROM more focus on DF; proximal LE strengthening; work on standing endurance; L ankle proprioceptive training; gait  training with SPC vs SBQC; HEP updates as indicated in prep for upcoming cruise vacation    PT Home Exercise Plan Access Code: [email or web address](10/12, updated 10/18)    Consulted and Agree with Plan of Care Patient             Patient will benefit from skilled therapeutic intervention in order to improve the following deficits and impairments:  Abnormal gait, Cardiopulmonary status  limiting activity, Decreased activity tolerance, Decreased balance, Decreased endurance, Decreased knowledge of use of DME, Decreased mobility, Decreased range of motion, Decreased safety awareness, Decreased scar mobility, Decreased strength, Difficulty walking, Increased edema, Increased fascial restricitons, Impaired perceived functional ability, Impaired flexibility, Impaired sensation, Improper body mechanics, Postural dysfunction, Pain  Visit Diagnosis: Stiffness of left ankle, not elsewhere classified  Muscle weakness (generalized)  Other abnormalities of gait and mobility  Difficulty in walking, not elsewhere classified  Localized edema  Unsteadiness on feet     Problem List Patient Active Problem List   Diagnosis Date Noted   Age-related osteoporosis with current pathological fracture 11/26/2020   Pain of right upper extremity 11/11/2020   Bradycardia 11/11/2020   Neck pain 03/11/2020   Tremor 03/01/2020   Hypothyroid 03/01/2020   Physical deconditioning 11/25/2019   At risk for obstructive sleep apnea 11/25/2019   Primary osteoarthritis of right shoulder 02/28/2019   Other forms of dyspnea 11/22/2018   Educated about COVID-19 virus infection 07/03/2018   Pain of right hip joint 03/18/2018   Chronic nonallergic rhinitis 12/20/2017   Cough, persistent 12/20/2017   Right flank pain 12/10/2017   Fatigue 07/12/2017   Morbid obesity ([place]) 03/05/2017   Chronic diastolic heart failure ([place]) 11/29/2016   Dyspnea on exertion 10/06/2016   Asthma 10/06/2016   Encounter for Medicare annual wellness  exam 05/22/2016   Hyperglycemia 08/07/2015   Allergic 08/07/2015   Gout 08/07/2015   Leukocytosis 08/07/2015   Lower abdominal pain 04/21/2015   Low back pain 11/02/2014   Esophageal reflux 10/28/2014   Essential hypertension, benign 10/28/2014   Mixed hyperlipidemia 10/28/2014   Benign essential tremor 10/28/2014   Bilateral dry eyes    Moderate persistent asthma    Arthritis    Cancer ([place])    History of chicken pox     Artist Pais, PTA 12/16/2020, 2:45 PM  [facility] [address]  [place], [place], [address] Phone: [phone]   Fax:  [phone]  Name: Amanda Copeland MRN: [id number] Date of Birth: [birth year]

## 2020-12-19 ENCOUNTER — Other Ambulatory Visit: Payer: Self-pay | Admitting: Family Medicine

## 2020-12-21 NOTE — Telephone Encounter (Addendum)
Pt ready for scheduling on or after 12/22/20  Out-of-pocket cost due at time of visit: $59  Primary: Medicare [place] co-insurance: 20%(approximately $270)  Admin fee co-insurance: 20% (approximately $25)  Deductible: $233 of $233 met  Secondary: BCBS [employer] co-insurance: 20% (approximately $54) Admin fee co-insurance: 20% (approximately ($5)  Deductible: $50 of $50 met  Prior Auth: not required PA# Valid:    ** This summary of benefits is an estimation of the patient's out-of-pocket cost. Exact cost may vary based on individual plan coverage.

## 2020-12-23 NOTE — Telephone Encounter (Signed)
Left message for patient to call back  

## 2020-12-31 NOTE — Telephone Encounter (Signed)
Appt with Dr. Raeford Razor 01/06/21

## 2021-01-04 ENCOUNTER — Ambulatory Visit: Payer: Medicare Other | Admitting: Physical Therapy

## 2021-01-04 ENCOUNTER — Other Ambulatory Visit: Payer: Self-pay | Admitting: Family Medicine

## 2021-01-06 ENCOUNTER — Ambulatory Visit: Payer: Medicare Other | Admitting: Family Medicine

## 2021-01-15 ENCOUNTER — Other Ambulatory Visit: Payer: Self-pay | Admitting: Interventional Cardiology

## 2021-01-18 ENCOUNTER — Ambulatory Visit: Payer: Medicare Other | Attending: Sports Medicine | Admitting: Physical Therapy

## 2021-01-18 ENCOUNTER — Other Ambulatory Visit: Payer: Self-pay

## 2021-01-18 ENCOUNTER — Other Ambulatory Visit: Payer: Self-pay | Admitting: Neurology

## 2021-01-18 ENCOUNTER — Encounter: Payer: Self-pay | Admitting: Physical Therapy

## 2021-01-18 DIAGNOSIS — R2689 Other abnormalities of gait and mobility: Secondary | ICD-10-CM | POA: Diagnosis not present

## 2021-01-18 DIAGNOSIS — M6281 Muscle weakness (generalized): Secondary | ICD-10-CM | POA: Diagnosis not present

## 2021-01-18 DIAGNOSIS — R262 Difficulty in walking, not elsewhere classified: Secondary | ICD-10-CM | POA: Diagnosis not present

## 2021-01-18 DIAGNOSIS — G250 Essential tremor: Secondary | ICD-10-CM

## 2021-01-18 DIAGNOSIS — R2681 Unsteadiness on feet: Secondary | ICD-10-CM | POA: Insufficient documentation

## 2021-01-18 DIAGNOSIS — M25672 Stiffness of left ankle, not elsewhere classified: Secondary | ICD-10-CM | POA: Insufficient documentation

## 2021-01-18 DIAGNOSIS — R600 Localized edema: Secondary | ICD-10-CM | POA: Diagnosis not present

## 2021-01-18 NOTE — Therapy (Signed)
[facility] [address]  [place], [place], [address] Phone: [phone]   Fax:  [phone]  Physical Therapy Treatment / Discharge Summary  Patient Details  Name: Amanda Copeland MRN: [id number] Date of Birth: October 20, 1940 Referring Provider (PT): Becky Sax, MD   Encounter Date: 01/18/2021   PT End of Session - 01/18/21 1700     Visit Number 10    Number of Visits 16    Date for PT Re-Evaluation 01/13/21    Authorization Type Medicare & BCBS    PT Start Time 1700    PT Stop Time 1732    PT Time Calculation (min) 32 min    Activity Tolerance Patient tolerated treatment well    Behavior During Therapy [facility] for tasks assessed/performed             Past Medical History:  Diagnosis Date   Arthritis    Asthma    childhood now returning   Back pain affecting pregnancy 11/02/2014   Benign essential tremor 10/28/2014   Bilateral dry eyes    Breast cancer ([place])    right   Cancer ([place])    Breast   Dry eyes    Encounter for Medicare annual wellness exam 05/22/2016   Essential hypertension, benign 10/28/2014   H/O measles    H/O mumps    History of chicken pox    Lumbago 11/02/2014   Mixed hyperlipidemia 10/28/2014   Obesity 10/28/2014   Stenosis of cervical spine     Past Surgical History:  Procedure Laterality Date   BACK SURGERY     BREAST REDUCTION SURGERY Left    CHOLECYSTECTOMY     EYE SURGERY  2008   b/l cataracts removed, in [place] Bilateral    ROTATOR CUFF REPAIR Left     There were no vitals filed for this visit.   Subjective Assessment - 01/18/21 1702     Subjective Pt reports she has mostly weaned from the ADs - used rollator briefly last week when her back was bothering her but mostly just brings her can with "just in case".    Pertinent History hx of surgery for "ruptured disc", hx of breast  cancerChronic LBP with h/o surgery for "ruptured disc", spinal stenosis, OA, B TKA, L RTC repair, chronic diastolic HF, HLD, moderate asthma, DOE, HTN, breast cancer, benign essential tremor, morbid obesity    Patient Stated Goals " to be able to walk w/o the RW and go on a famliy cruise to celebrate my 50th wedding anniversary leaving 12/18/20"    Currently in Pain? No/denies                [facility] PT Assessment - 01/18/21 1700       Assessment   Medical Diagnosis L ankle fracture    Referring Provider (PT) Becky Sax, MD    Onset Date/Surgical Date 08/19/20    Next MD Visit PRN only      Observation/Other Assessments   Focus on Therapeutic Outcomes (FOTO)  Ankle = 75 (44 point improvement from eval); exceeding predicted D/C FS = 50      AROM   Left Ankle Dorsiflexion 15    Left Ankle Plantar Flexion 61    Left Ankle Inversion 26    Left Ankle Eversion 39      Strength   Overall Strength Comments tested in  sitting    Right Hip Flexion 4+/5    Right Hip Extension 4/5    Right Hip ABduction 5/5    Right Hip ADduction 4/5    Left Hip Flexion 4+/5    Left Hip Extension 4/5    Left Hip ABduction 5/5    Left Hip ADduction 4/5    Right Knee Flexion 4+/5    Right Knee Extension 4+/5    Left Knee Flexion 4+/5    Left Knee Extension 4+/5    Right Ankle Dorsiflexion 5/5    Right Ankle Plantar Flexion 5/5   manual resistance   Right Ankle Inversion 5/5    Right Ankle Eversion 5/5    Left Ankle Dorsiflexion 5/5    Left Ankle Plantar Flexion 4+/5   manual resistance   Left Ankle Inversion 4/5    Left Ankle Eversion 5/5                           [facility] Adult PT Treatment/Exercise - 01/18/21 1700       Ambulation/Gait   Ambulation/Gait Assistance 7: Independent    Ambulation Distance (Feet) 540 Feet    Assistive device None    Gait Pattern Step-through pattern;Wide base of support                       PT Short Term Goals - 12/08/20 1545        PT SHORT TERM GOAL #1   Title Patient will be independent with initial HEP    Status Achieved   11/24/20     PT SHORT TERM GOAL #2   Title Patient will demonstrate safe transfer technique and proper gait pattern with 4-wheel rolling walker    Status Achieved   12/08/20   Target Date 12/09/20               PT Long Term Goals - 01/18/21 1709       PT LONG TERM GOAL #1   Title Patient will be independent with ongoing/advanced HEP for self-management at home    Status Achieved   01/18/21     PT LONG TERM GOAL #2   Title Patient to improve L ankle AROM to [facility] without pain provocation    Status Achieved   01/18/21     PT LONG TERM GOAL #3   Title Patient will demonstrate improved B LE strength to >/= 4/5 to 4+/5 for improved stability and ease of mobility    Status Achieved   01/18/21     PT LONG TERM GOAL #4   Title Patient will ambulate with normal gait pattern and good gait stability with or w/o LRAD    Status Achieved   01/18/21                  Plan - 01/18/21 1732     Clinical Impression Statement Amanda Copeland reports her cruise went well with no issues related to mobility or her ankle, only limited due to diagnosed with COVID-19 and quarantined for that last 3 days. She has mostly weaned from all ADs, but does keep the cane with her "just in case". Her gait pattern w/o any AD is back to normal for her with no gait abnormality or favoring of her L LE due to her ankle noted. Her L ankle ROM is now WNL and her overall B LE strength is 4/5 to 5/5. All goals have been met and  she has exceeded the predicted FOTO score with a 44-point improvement to 75. Amanda Copeland feels confident transitioning to her HEP and denies need for any review today. She is in agreement with plan to proceed with discharge from PT today with goals met.    Comorbidities Chronic LBP with h/o surgery for "ruptured disc", spinal stenosis, OA, B TKA, L RTC repair, chronic diastolic HF, HLD, moderate asthma, DOE,  HTN, breast cancer, benign essential tremor, morbid obesity    PT Treatment/Interventions ADLs/Self Care Home Management;Aquatic Therapy;Cryotherapy;Electrical Stimulation;Iontophoresis 91m/ml Dexamethasone;Moist Heat;DME Instruction;Gait training;Stair training;Functional mobility training;Therapeutic activities;Therapeutic exercise;Balance training;Neuromuscular re-education;Patient/family education;Manual techniques;Scar mobilization;Passive range of motion;Dry needling;Taping;Vasopneumatic Device;Joint Manipulations    PT Next Visit Plan Discharge from PT - Pt to continue with her HEP    PT Home Exercise Plan Access Code: [email or web address](10/12, updated 10/18)    Consulted and Agree with Plan of Care Patient             Patient will benefit from skilled therapeutic intervention in order to improve the following deficits and impairments:  Abnormal gait, Cardiopulmonary status limiting activity, Decreased activity tolerance, Decreased balance, Decreased endurance, Decreased knowledge of use of DME, Decreased mobility, Decreased range of motion, Decreased safety awareness, Decreased scar mobility, Decreased strength, Difficulty walking, Increased edema, Increased fascial restricitons, Impaired perceived functional ability, Impaired flexibility, Impaired sensation, Improper body mechanics, Postural dysfunction, Pain  Visit Diagnosis: Stiffness of left ankle, not elsewhere classified  Muscle weakness (generalized)  Other abnormalities of gait and mobility  Difficulty in walking, not elsewhere classified  Localized edema  Unsteadiness on feet     Problem List Patient Active Problem List   Diagnosis Date Noted   Age-related osteoporosis with current pathological fracture 11/26/2020   Pain of right upper extremity 11/11/2020   Bradycardia 11/11/2020   Neck pain 03/11/2020   Tremor 03/01/2020   Hypothyroid 03/01/2020   Physical deconditioning 11/25/2019   At risk for obstructive sleep  apnea 11/25/2019   Primary osteoarthritis of right shoulder 02/28/2019   Other forms of dyspnea 11/22/2018   Educated about COVID-19 virus infection 07/03/2018   Pain of right hip joint 03/18/2018   Chronic nonallergic rhinitis 12/20/2017   Cough, persistent 12/20/2017   Right flank pain 12/10/2017   Fatigue 07/12/2017   Morbid obesity ([place] 03/05/2017   Chronic diastolic heart failure [place] 11/29/2016   Dyspnea on exertion 10/06/2016   Asthma 10/06/2016   Encounter for Medicare annual wellness exam 05/22/2016   Hyperglycemia 08/07/2015   Allergic 08/07/2015   Gout 08/07/2015   Leukocytosis 08/07/2015   Lower abdominal pain 04/21/2015   Low back pain 11/02/2014   Esophageal reflux 10/28/2014   Essential hypertension, benign 10/28/2014   Mixed hyperlipidemia 10/28/2014   Benign essential tremor 10/28/2014   Bilateral dry eyes    Moderate persistent asthma    Arthritis    Cancer ([place]    History of chicken pox      PHYSICAL THERAPY DISCHARGE SUMMARY  Visits from Start of Care: 10  Current functional level related to goals / functional outcomes:   Refer to above clinical impression.   Remaining deficits:   As above.   Education / Equipment:   HEP   Patient agrees to discharge. Patient goals were met. Patient is being discharged due to meeting the stated rehab goals.   JPercival Spanish PT 01/18/2021, 5:45 PM  [facility] [place] [address]Phone: [phone]  Fax:  [phone]  Name: Amanda Copeland MRN: [id number] Date of Birth: 01-01-1941

## 2021-01-20 ENCOUNTER — Ambulatory Visit: Payer: Medicare Other | Admitting: Family Medicine

## 2021-01-20 ENCOUNTER — Ambulatory Visit: Payer: Self-pay

## 2021-01-20 ENCOUNTER — Encounter: Payer: Self-pay | Admitting: Family Medicine

## 2021-01-20 VITALS — BP 112/72 | Ht 60.0 in | Wt 231.0 lb

## 2021-01-20 DIAGNOSIS — S22080A Wedge compression fracture of T11-T12 vertebra, initial encounter for closed fracture: Secondary | ICD-10-CM

## 2021-01-20 DIAGNOSIS — E559 Vitamin D deficiency, unspecified: Secondary | ICD-10-CM

## 2021-01-20 DIAGNOSIS — M8000XD Age-related osteoporosis with current pathological fracture, unspecified site, subsequent encounter for fracture with routine healing: Secondary | ICD-10-CM

## 2021-01-20 DIAGNOSIS — M47818 Spondylosis without myelopathy or radiculopathy, sacral and sacrococcygeal region: Secondary | ICD-10-CM

## 2021-01-20 MED ORDER — TRIAMCINOLONE ACETONIDE 40 MG/ML IJ SUSP
40.0000 mg | Freq: Once | INTRAMUSCULAR | Status: AC
  Administered 2021-01-20: 10:00:00 40 mg via INTRA_ARTICULAR

## 2021-01-20 NOTE — Progress Notes (Signed)
Amanda Copeland - 80 y.o. female MRN [id number]  Date of birth: 15-May-1940  SUBJECTIVE:  Including CC & ROS.  No chief complaint on file.   Amanda Copeland is a 80 y.o. female that is presenting with acute left-sided low back pain.  Has been ongoing for about a week.  Her previous vitamin D was found to be low and supplemented.  Has a history from 2021 shows compression fracture.  Had recent left bimalleolar fracture.   Review of Systems See HPI   HISTORY: Past Medical, Surgical, Social, and Family History Reviewed & Updated per EMR.   Pertinent Historical Findings include:  Past Medical History:  Diagnosis Date   Arthritis    Asthma    childhood now returning   Back pain affecting pregnancy 11/02/2014   Benign essential tremor 10/28/2014   Bilateral dry eyes    Breast cancer ([place])    right   Cancer ([place])    Breast   Dry eyes    Encounter for Medicare annual wellness exam 05/22/2016   Essential hypertension, benign 10/28/2014   H/O measles    H/O mumps    History of chicken pox    Lumbago 11/02/2014   Mixed hyperlipidemia 10/28/2014   Obesity 10/28/2014   Stenosis of cervical spine     Past Surgical History:  Procedure Laterality Date   BACK SURGERY     BREAST REDUCTION SURGERY Left    CHOLECYSTECTOMY     EYE SURGERY  2008   b/l cataracts removed, in [place] Right    REDUCTION MAMMAPLASTY     REPLACEMENT TOTAL KNEE BILATERAL Bilateral    ROTATOR CUFF REPAIR Left     Family History  Problem Relation Age of Onset   Heart disease Mother    Heart attack Mother    Heart failure Mother    Hypertension Mother    Cancer Father        colon   Asthma Father    Parkinson's disease Father    Cancer Maternal Grandmother        uterine   Heart disease Maternal Grandfather    Obesity Daughter    Appendicitis Paternal Grandmother    Stroke Neg Hx     Social History   Socioeconomic History   Marital status: Married    Spouse name: Not on file   Number of  children: 1   Years of education: Not on file   Highest education level: Master's degree (e.g., MA, MS, MEng, MEd, MSW, MBA)  Occupational History   Occupation: retired    Comment: Pharmacist, 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)  Tobacco Use   Smoking status: Never   Smokeless tobacco: Never  Vaping Use   Vaping Use: Never used  Substance and Sexual Activity   Alcohol use: Yes    Alcohol/week: 0.0 standard drinks    Comment: two times a month   Drug use: No   Sexual activity: Not on file    Comment: lives with husband, moved [place], no dietary restrictions, retired Education officer, [employer]  Other Topics Concern   Not on file  Social History Narrative   Pt lives with Spouse at home 1 daughter   Drinks coffee, and soda   Right handed   Social Determinants of Health   Financial Resource Strain: Not on file  Food Insecurity: Not on file  Transportation Needs: Not on file  Physical Activity: Not on file  Stress: Not on file  Social Connections: Not on file  Intimate Partner  Violence: Not on file     PHYSICAL EXAM:  VS: BP 112/72 (BP Location: Left Arm, Patient Position: Sitting)    Ht 5' (1.524 m)    Wt 231 lb (104.8 kg)    BMI 45.11 kg/m  Physical Exam Gen: NAD, alert, cooperative with exam, well-appearing    Aspiration/Injection Procedure Note Amanda Copeland 1941-01-25  Procedure: Injection Indications: Left SI joint pain  Procedure Details Consent: Risks of procedure as well as the alternatives and risks of each were explained to the (patient/caregiver).  Consent for procedure obtained. Time Out: Verified patient identification, verified procedure, site/side was marked, verified correct patient position, special equipment/implants available, medications/allergies/relevent history reviewed, required imaging and test results available.  Performed.  The area was cleaned with iodine and alcohol swabs.    The left SI joint was injected using 3 cc of 1% lidocaine on a 22-gauge 3-1/2 inch needle.  The syringe  was switched and a mixture containing 1 cc's of 40 mg Kenalog and 4 cc's of 0.25% bupivacaine was injected.  Ultrasound was needed in order to visualize the needle at the end destination in the SI joint.  Ultrasound was used. Images were obtained in long views showing the injection.     A sterile dressing was applied.  Patient did tolerate procedure well.  ASSESSMENT & PLAN:   Vitamin D deficiency Check vitamin D   SI joint arthritis Has changes appreciated on previous imaging.  Tender to palpation over the left SI joint.  Likely related to abnormal gait with recent healing of left bimalleolar fracture. -Counseled on home exercise therapy and supportive care. -SI joint injection. -Could consider imaging or physical therapy.  Compression fracture of T11 vertebra (HCC) Was appreciated on previous imaging  -Initiate Prolia and check vitamin D.  Age-related osteoporosis with current pathological fracture Initiated Prolia.

## 2021-01-20 NOTE — Patient Instructions (Signed)
Good to see you Please try heat  Please try the exercises  I will call with the results from today   Please send me a message in MyChart with any questions or updates.  Please see me back in 6-8 weeks.   --Dr. Raeford Razor

## 2021-01-20 NOTE — Assessment & Plan Note (Signed)
Check vitamin D. 

## 2021-01-20 NOTE — Assessment & Plan Note (Signed)
Has changes appreciated on previous imaging.  Tender to palpation over the left SI joint.  Likely related to abnormal gait with recent healing of left bimalleolar fracture. -Counseled on home exercise therapy and supportive care. -SI joint injection. -Could consider imaging or physical therapy.

## 2021-01-20 NOTE — Assessment & Plan Note (Signed)
Was appreciated on previous imaging  -Initiate Prolia and check vitamin D.

## 2021-01-20 NOTE — Assessment & Plan Note (Signed)
Initiated Prolia.

## 2021-01-21 ENCOUNTER — Telehealth: Payer: Self-pay | Admitting: Family Medicine

## 2021-01-21 LAB — VITAMIN D 25 HYDROXY (VIT D DEFICIENCY, FRACTURES): Vit D, 25-Hydroxy: 28.6 ng/mL — ABNORMAL LOW (ref 30.0–100.0)

## 2021-01-21 MED ORDER — VITAMIN D (ERGOCALCIFEROL) 1.25 MG (50000 UNIT) PO CAPS: 50000.0000 [IU] | ORAL_CAPSULE | ORAL | 0 refills | Status: DC

## 2021-01-21 NOTE — Telephone Encounter (Signed)
Left VM for patient. If she calls back please have her speak with a nurse/CMA and inform that her vitamin d is still low. Will send in rx.   If any questions then please take the best time and phone number to call and I will try to call her back.   Rosemarie Ax, MD [facility] 01/21/2021, 2:56 PM

## 2021-01-22 NOTE — Telephone Encounter (Signed)
Pt informed of below.  

## 2021-01-27 ENCOUNTER — Other Ambulatory Visit: Payer: Self-pay | Admitting: Family Medicine

## 2021-01-27 DIAGNOSIS — E782 Mixed hyperlipidemia: Secondary | ICD-10-CM

## 2021-02-05 ENCOUNTER — Telehealth: Payer: Self-pay | Admitting: Medical

## 2021-02-05 MED ORDER — OSELTAMIVIR PHOSPHATE 75 MG PO CAPS: 75.0000 mg | ORAL_CAPSULE | Freq: Every day | ORAL | 0 refills | Status: DC

## 2021-02-05 NOTE — Telephone Encounter (Signed)
Rx tamiflu prevention. Husband has flu. I am seeing him today. 3 day weekend upcoming. Mackie Pai, PA-C

## 2021-02-12 ENCOUNTER — Other Ambulatory Visit: Payer: Self-pay | Admitting: Family Medicine

## 2021-02-16 ENCOUNTER — Ambulatory Visit (INDEPENDENT_AMBULATORY_CARE_PROVIDER_SITE_OTHER): Payer: Medicare Other | Admitting: Family Medicine

## 2021-02-16 ENCOUNTER — Other Ambulatory Visit (INDEPENDENT_AMBULATORY_CARE_PROVIDER_SITE_OTHER): Payer: Medicare Other

## 2021-02-16 ENCOUNTER — Encounter: Payer: Self-pay | Admitting: Family Medicine

## 2021-02-16 VITALS — BP 106/62 | HR 53 | Temp 97.3°F | Resp 16 | Ht 60.0 in | Wt 233.6 lb

## 2021-02-16 DIAGNOSIS — R5381 Other malaise: Secondary | ICD-10-CM

## 2021-02-16 DIAGNOSIS — I10 Essential (primary) hypertension: Secondary | ICD-10-CM | POA: Diagnosis not present

## 2021-02-16 DIAGNOSIS — R739 Hyperglycemia, unspecified: Secondary | ICD-10-CM | POA: Diagnosis not present

## 2021-02-16 DIAGNOSIS — R059 Cough, unspecified: Secondary | ICD-10-CM | POA: Diagnosis not present

## 2021-02-16 DIAGNOSIS — E039 Hypothyroidism, unspecified: Secondary | ICD-10-CM

## 2021-02-16 DIAGNOSIS — E782 Mixed hyperlipidemia: Secondary | ICD-10-CM

## 2021-02-16 LAB — COMPREHENSIVE METABOLIC PANEL
ALT: 11 U/L (ref 0–35)
AST: 15 U/L (ref 0–37)
Albumin: 3.8 g/dL (ref 3.5–5.2)
Alkaline Phosphatase: 85 U/L (ref 39–117)
BUN: 19 mg/dL (ref 6–23)
CO2: 26 mEq/L (ref 19–32)
Calcium: 9.4 mg/dL (ref 8.4–10.5)
Chloride: 102 mEq/L (ref 96–112)
Creatinine, Ser: 0.98 mg/dL (ref 0.40–1.20)
GFR: 54.26 mL/min — ABNORMAL LOW (ref >60.00)
Glucose, Bld: 87 mg/dL (ref 70–99)
Potassium: 4.2 mEq/L (ref 3.5–5.1)
Sodium: 139 mEq/L (ref 135–145)
Total Bilirubin: 0.3 mg/dL (ref 0.2–1.2)
Total Protein: 6.4 g/dL (ref 6.0–8.3)

## 2021-02-16 LAB — LIPID PANEL
Cholesterol: 189 mg/dL (ref 0–200)
HDL: 51.6 mg/dL (ref >39.00)
LDL Cholesterol: 112 mg/dL — ABNORMAL HIGH (ref 0–99)
NonHDL: 137.7
Total CHOL/HDL Ratio: 4
Triglycerides: 127 mg/dL (ref 0.0–149.0)
VLDL: 25.4 mg/dL (ref 0.0–40.0)

## 2021-02-16 LAB — CBC WITH DIFFERENTIAL/PLATELET
Basophils Absolute: 0.1 10*3/uL (ref 0.0–0.1)
Basophils Relative: 0.5 % (ref 0.0–3.0)
Eosinophils Absolute: 0.2 10*3/uL (ref 0.0–0.7)
Eosinophils Relative: 1.7 % (ref 0.0–5.0)
HCT: 41.3 % (ref 36.0–46.0)
Hemoglobin: 13.5 g/dL (ref 12.0–15.0)
Lymphocytes Relative: 20.2 % (ref 12.0–46.0)
Lymphs Abs: 2 10*3/uL (ref 0.7–4.0)
MCHC: 32.6 g/dL (ref 30.0–36.0)
MCV: 95.4 fl (ref 78.0–100.0)
Monocytes Absolute: 0.8 10*3/uL (ref 0.1–1.0)
Monocytes Relative: 8.4 % (ref 3.0–12.0)
Neutro Abs: 6.8 10*3/uL (ref 1.4–7.7)
Neutrophils Relative %: 69.2 % (ref 43.0–77.0)
Platelets: 371 10*3/uL (ref 150.0–400.0)
RBC: 4.33 Mil/uL (ref 3.87–5.11)
RDW: 15.8 % — ABNORMAL HIGH (ref 11.5–15.5)
WBC: 9.8 10*3/uL (ref 4.0–10.5)

## 2021-02-16 LAB — HEMOGLOBIN A1C: Hgb A1c MFr Bld: 5.6 % (ref 4.6–6.5)

## 2021-02-16 LAB — TSH: TSH: 1.71 u[IU]/mL (ref 0.35–5.50)

## 2021-02-16 MED ORDER — PROMETHAZINE-DM 6.25-15 MG/5ML PO SYRP: 2.5000 mL | ORAL_SOLUTION | Freq: Three times a day (TID) | ORAL | 0 refills | Status: DC | PRN

## 2021-02-16 MED ORDER — DOXYCYCLINE HYCLATE 100 MG PO TABS: 100.0000 mg | ORAL_TABLET | Freq: Two times a day (BID) | ORAL | 0 refills | Status: DC

## 2021-02-16 NOTE — Patient Instructions (Addendum)
Probiotic daily   If cough persists then add Pepcid/Famotidine 20-40 mg at bedtime  Cough, Adult Coughing is a reflex that clears your throat and your airways (respiratory system). Coughing helps to heal and protect your lungs. It is normal to cough occasionally, but a cough that happens with other symptoms or lasts a long time may be a sign of a condition that needs treatment. An acute cough may only last 2-3 weeks, while a chronic cough may last 8 or more weeks. Coughing is commonly caused by: Infection of the respiratory systemby viruses or bacteria. Breathing in substances that irritate your lungs. Allergies. Asthma. Mucus that runs down the back of your throat (postnasal drip). Smoking. Acid backing up from the stomach into the esophagus (gastroesophageal reflux). Certain medicines. Chronic lung problems. Other medical conditions such as heart failure or a blood clot in the lung (pulmonary embolism). Follow these instructions at home: Medicines Take over-the-counter and prescription medicines only as told by your health care provider. Talk with your health care provider before you take a cough suppressant medicine. Lifestyle  Avoid cigarette smoke. Do not use any products that contain nicotine or tobacco, such as cigarettes, e-cigarettes, and chewing tobacco. If you need help quitting, ask your health care provider. Drink enough fluid to keep your urine pale yellow. Avoid caffeine. Do not drink alcohol if your health care provider tells you not to drink. General instructions  Pay close attention to changes in your cough. Tell your health care provider about them. Always cover your mouth when you cough. Avoid things that make you cough, such as perfume, candles, cleaning products, or campfire or tobacco smoke. If the air is dry, use a cool mist vaporizer or humidifier in your bedroom or your home to help loosen secretions. If your cough is worse at night, try to sleep in a  semi-upright position. Rest as needed. Keep all follow-up visits as told by your health care provider. This is important. Contact a health care provider if you: Have new symptoms. Cough up pus. Have a cough that does not get better after 2-3 weeks or gets worse. Cannot control your cough with cough suppressant medicines and you are losing sleep. Have pain that gets worse or pain that is not helped with medicine. Have a fever. Have unexplained weight loss. Have night sweats. Get help right away if: You cough up blood. You have difficulty breathing. Your heartbeat is very fast. These symptoms may represent a serious problem that is an emergency. Do not wait to see if the symptoms will go away. Get medical help right away. Call your local emergency services (911 in the [place] Do not drive yourself to the hospital. Summary Coughing is a reflex that clears your throat and your airways. It is normal to cough occasionally, but a cough that happens with other symptoms or lasts a long time may be a sign of a condition that needs treatment. Take over-the-counter and prescription medicines only as told by your health care provider. Always cover your mouth when you cough. Contact a health care provider if you have new symptoms or a cough that does not get better after 2-3 weeks or gets worse. This information is not intended to replace advice given to you by your health care provider. Make sure you discuss any questions you have with your health care provider. Document Revised: 02/12/2018 Document Reviewed: 02/12/2018 Elsevier Patient Education  [place].

## 2021-02-16 NOTE — Assessment & Plan Note (Signed)
Has been struggling 3 to 4 weeks now.  Initially her husband was diagnosed with flu and patient was treated with Tamiflu but it did not help with cough.  The cough is worse at night and nonproductive although it does rattle a good bit.  She denies shortness of breath or chest pain.  She denies any reflux.  Discussed the idea of silent heartburn but so far no obvious evidence of that.  We will treat empirically with doxycycline and Promethazine DM for the cough and she will notify us if no improvement.

## 2021-02-16 NOTE — Assessment & Plan Note (Signed)
On Levothyroxine, continue to monitor 

## 2021-02-16 NOTE — Assessment & Plan Note (Signed)
Well controlled, no changes to meds. Encouraged heart healthy diet such as the DASH diet and exercise as tolerated.  °

## 2021-02-16 NOTE — Assessment & Plan Note (Signed)
She is doing much better and walking without a walker or cane at this time.

## 2021-02-16 NOTE — Progress Notes (Signed)
Subjective:    Patient ID: Amanda Copeland, female    DOB: 1940/10/09, 81 y.o.   MRN: [id number]  Chief Complaint  Patient presents with   3 months follow up     Cough that has been there for weeks    HPI Patient is in today for follow up on chronic medical concerns. No recent febrile illness or hospitalizations. Denies CP/palp/SOB/HA/fevers/GI or GU c/o. Taking meds as prescribed. Has been struggling 3 to 4 weeks now.  Initially her husband was diagnosed with flu and patient was treated with Tamiflu but it did not help with cough.  The cough is worse at night and nonproductive although it does rattle a good bit.  She denies shortness of breath or chest pain.  She denies any reflux.   Past Medical History:  Diagnosis Date   Arthritis    Asthma    childhood now returning   Back pain affecting pregnancy 11/02/2014   Benign essential tremor 10/28/2014   Bilateral dry eyes    Breast cancer ([place])    right   Cancer ([place])    Breast   Dry eyes    Encounter for Medicare annual wellness exam 05/22/2016   Essential hypertension, benign 10/28/2014   H/O measles    H/O mumps    History of chicken pox    Lumbago 11/02/2014   Mixed hyperlipidemia 10/28/2014   Obesity 10/28/2014   Stenosis of cervical spine     Past Surgical History:  Procedure Laterality Date   BACK SURGERY     BREAST REDUCTION SURGERY Left    CHOLECYSTECTOMY     EYE SURGERY  2008   b/l cataracts removed, in [place] Right    REDUCTION MAMMAPLASTY     REPLACEMENT TOTAL KNEE BILATERAL Bilateral    ROTATOR CUFF REPAIR Left     Family History  Problem Relation Age of Onset   Heart disease Mother    Heart attack Mother    Heart failure Mother    Hypertension Mother    Cancer Father        colon   Asthma Father    Parkinson's disease Father    Cancer Maternal Grandmother        uterine   Heart disease Maternal Grandfather    Obesity Daughter    Appendicitis Paternal Grandmother    Stroke Neg Hx      Social History   Socioeconomic History   Marital status: Married    Spouse name: Not on file   Number of children: 1   Years of education: Not on file   Highest education level: Master's degree (e.g., MA, MS, MEng, MEd, MSW, MBA)  Occupational History   Occupation: retired    Comment: Pharmacist, 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)  Tobacco Use   Smoking status: Never   Smokeless tobacco: Never  Vaping Use   Vaping Use: Never used  Substance and Sexual Activity   Alcohol use: Yes    Alcohol/week: 0.0 standard drinks    Comment: two times a month   Drug use: No   Sexual activity: Not on file    Comment: lives with husband, moved [place], no dietary restrictions, retired Education officer, museum  Other Topics Concern   Not on file  Social History Narrative   Pt lives with Spouse at home 1 daughter   Drinks coffee, and soda   Right handed   Social Determinants of Health   Financial Resource Strain: Not on file  Food Insecurity: Not  on file  Transportation Needs: Not on file  Physical Activity: Not on file  Stress: Not on file  Social Connections: Not on file  Intimate Partner Violence: Not on file    Outpatient Medications Prior to Visit  Medication Sig Dispense Refill   albuterol (PROVENTIL HFA;VENTOLIN HFA) 108 (90 Base) MCG/ACT inhaler INHALE 2 PUFFS INTO THE LUNGS EVERY 4 (FOUR) HOURS AS NEEDED FOR WHEEZING OR SHORTNESS OF BREATH. 6.7 Inhaler 1   allopurinol (ZYLOPRIM) 100 MG tablet Take 2 tablets (200 mg total) by mouth daily. 180 tablet 1   AMBULATORY NON FORMULARY MEDICATION Rollator  Dx: Gait instability 1 Device 0   amLODipine (NORVASC) 5 MG tablet Take 1 tablet (5 mg total) by mouth daily. 90 tablet 3   Ascorbic Acid (VITAMIN C PO) Take 1 tablet by mouth daily.      aspirin 81 MG tablet Take 81 mg by mouth daily.     colchicine 0.6 MG tablet Take 2 tabs at first and then 1 tab q 2 hours til pain relief or max of 6 tabs in 24 hours (Patient taking differently: as needed. Take 2 tabs at first  and then 1 tab q 2 hours til pain relief or max of 6 tabs in 24 hours) 6 tablet 1   DYMISTA 137-50 MCG/ACT SUSP 1-2 SPRAYS PER NOSTRIL TWICE DAILY FOR RUNNY NOSE 69 g 1   ezetimibe (ZETIA) 10 MG tablet TAKE 1 TABLET BY MOUTH EVERY DAY 90 tablet 1   fluticasone (FLOVENT HFA) 110 MCG/ACT inhaler 2 PUFFS TWICE DAILY TO PREVENT COUGHING OR WHEEZING. MAX INS WILL COVER IS A 30 DAY 36 each 1   furosemide (LASIX) 40 MG tablet TAKE 1 TABLET BY MOUTH EVERY DAY 90 tablet 3   KLOR-CON M20 20 MEQ tablet TAKE 1 TABLET BY MOUTH DAILY. KEEP UPCOMING APPOINTMENT 90 tablet 3   levothyroxine (SYNTHROID) 112 MCG tablet TAKE 1 TABLET BY MOUTH DAILY BEFORE BREAKFAST. 90 tablet 1   Multiple Vitamins-Minerals (ICAPS AREDS 2) CAPS Take 1 capsule by mouth 2 (two) times daily.     PREVACID 30 MG capsule TAKE 1 CAPSULE BY MOUTH TWICE A DAY --MAX LIMIT 180 capsule 1   propranolol (INDERAL) 40 MG tablet TAKE 1 TABLET BY MOUTH TWICE A DAY 180 tablet 1   Red Yeast Rice Extract (RED YEAST RICE PO) Take by mouth daily.     Vitamin D, Ergocalciferol, (DRISDOL) 1.25 MG (50000 UNIT) CAPS capsule Take 1 capsule (50,000 Units total) by mouth every 7 (seven) days. Take for 8 total doses(weeks) 8 capsule 0   oseltamivir (TAMIFLU) 75 MG capsule Take 1 capsule (75 mg total) by mouth daily. 7 capsule 0   No facility-administered medications prior to visit.    Allergies  Allergen Reactions   Amoxicillin Rash   Ampicillin Rash   Penicillins Rash   Statins Other (See Comments)    Per reports muscle aches and joint pain Per reports muscle aches and joint pain    Review of Systems  Constitutional:  Positive for malaise/fatigue. Negative for fever.  HENT:  Positive for congestion.   Eyes:  Negative for blurred vision.  Respiratory:  Positive for cough. Negative for sputum production, shortness of breath and wheezing.   Cardiovascular:  Negative for chest pain, palpitations and leg swelling.  Gastrointestinal:  Negative for  abdominal pain, blood in stool and nausea.  Genitourinary:  Negative for dysuria and frequency.  Musculoskeletal:  Negative for falls.  Skin:  Negative for rash.  Neurological:  Negative for dizziness, loss of consciousness and headaches.  Endo/Heme/Allergies:  Negative for environmental allergies.  Psychiatric/Behavioral:  Negative for depression. The patient is not nervous/anxious.       Objective:    Physical Exam Constitutional:      General: She is not in acute distress.    Appearance: She is well-developed.  HENT:     Head: Normocephalic and atraumatic.  Eyes:     Conjunctiva/sclera: Conjunctivae normal.  Neck:     Thyroid: No thyromegaly.  Cardiovascular:     Rate and Rhythm: Normal rate and regular rhythm.     Heart sounds: Normal heart sounds. No murmur heard. Pulmonary:     Effort: Pulmonary effort is normal. No respiratory distress.     Breath sounds: Normal breath sounds.  Abdominal:     General: Bowel sounds are normal. There is no distension.     Palpations: Abdomen is soft. There is no mass.     Tenderness: There is no abdominal tenderness.  Musculoskeletal:     Cervical back: Neck supple.  Lymphadenopathy:     Cervical: No cervical adenopathy.  Skin:    General: Skin is warm and dry.  Neurological:     Mental Status: She is alert and oriented to person, place, and time.  Psychiatric:        Behavior: Behavior normal.    BP 106/62    Pulse (!) 53    Temp (!) 97.3 F (36.3 C)    Resp 16    Ht 5' (1.524 m)    Wt 233 lb 9.6 oz (106 kg)    SpO2 96%    BMI 45.62 kg/m  Wt Readings from Last 3 Encounters:  02/16/21 233 lb 9.6 oz (106 kg)  01/20/21 231 lb (104.8 kg)  12/10/20 231 lb 6.4 oz (105 kg)    Diabetic Foot Exam - Simple   No data filed    Lab Results  Component Value Date   WBC 8.1 11/10/2020   HGB 13.1 11/10/2020   HCT 39.6 11/10/2020   PLT 337.0 11/10/2020   GLUCOSE 98 11/10/2020   CHOL 200 11/10/2020   TRIG 228.0 (H) 11/10/2020   HDL  50.60 11/10/2020   LDLDIRECT 124.0 11/10/2020   LDLCALC 76 05/29/2020   ALT 8 11/10/2020   AST 20 11/10/2020   NA 142 11/10/2020   K 3.8 11/10/2020   CL 102 11/10/2020   CREATININE 0.76 11/10/2020   BUN 14 11/10/2020   CO2 25 11/10/2020   TSH 2.33 11/10/2020   HGBA1C 5.7 11/10/2020    Lab Results  Component Value Date   TSH 2.33 11/10/2020   Lab Results  Component Value Date   WBC 8.1 11/10/2020   HGB 13.1 11/10/2020   HCT 39.6 11/10/2020   MCV 93.9 11/10/2020   PLT 337.0 11/10/2020   Lab Results  Component Value Date   NA 142 11/10/2020   K 3.8 11/10/2020   CO2 25 11/10/2020   GLUCOSE 98 11/10/2020   BUN 14 11/10/2020   CREATININE 0.76 11/10/2020   BILITOT 0.3 11/10/2020   ALKPHOS 100 11/10/2020   AST 20 11/10/2020   ALT 8 11/10/2020   PROT 7.0 11/10/2020   ALBUMIN 4.0 11/10/2020   CALCIUM 9.5 11/10/2020   GFR 73.75 11/10/2020   Lab Results  Component Value Date   CHOL 200 11/10/2020   Lab Results  Component Value Date   HDL 50.60 11/10/2020   Lab Results  Component Value Date  LDLCALC 76 05/29/2020   Lab Results  Component Value Date   TRIG 228.0 (H) 11/10/2020   Lab Results  Component Value Date   CHOLHDL 4 11/10/2020   Lab Results  Component Value Date   HGBA1C 5.7 11/10/2020       Assessment & Plan:   Problem List Items Addressed This Visit     Essential hypertension, benign - Primary    Well controlled, no changes to meds. Encouraged heart healthy diet such as the DASH diet and exercise as tolerated.       Relevant Orders   Comprehensive metabolic panel   TSH   Mixed hyperlipidemia   Relevant Orders   Lipid panel   Hyperglycemia    hgba1c acceptable, minimize simple carbs. Increase exercise as tolerated.       Cough    Has been struggling 3 to 4 weeks now.  Initially her husband was diagnosed with flu and patient was treated with Tamiflu but it did not help with cough.  The cough is worse at night and nonproductive  although it does rattle a good bit.  She denies shortness of breath or chest pain.  She denies any reflux.  Discussed the idea of silent heartburn but so far no obvious evidence of that.  We will treat empirically with doxycycline and Promethazine DM for the cough and she will notify us if no improvement.      Relevant Orders   CBC with Differential/Platelet   Physical deconditioning    She is doing much better and walking without a walker or cane at this time.       Hypothyroid    On Levothyroxine, continue to monitor       I have discontinued Amanda Copeland "[name]"'s oseltamivir. I am also having her start on promethazine-dextromethorphan and doxycycline. Additionally, I am having her maintain her Red Yeast Rice Extract (RED YEAST RICE PO), aspirin, ICaps Areds 2, albuterol, Ascorbic Acid (VITAMIN C PO), AMBULATORY NON FORMULARY MEDICATION, Klor-Con M20, Dymista, Flovent HFA, allopurinol, colchicine, levothyroxine, amLODipine, furosemide, propranolol, Vitamin D (Ergocalciferol), ezetimibe, and Prevacid.  Meds ordered this encounter  Medications   promethazine-dextromethorphan (PROMETHAZINE-DM) 6.25-15 MG/5ML syrup    Sig: Take 2.5-5 mLs by mouth 3 (three) times daily as needed for cough.    Dispense:  160 mL    Refill:  0   doxycycline (VIBRA-TABS) 100 MG tablet    Sig: Take 1 tablet (100 mg total) by mouth 2 (two) times daily.    Dispense:  20 tablet    Refill:  0     [name], MD

## 2021-02-16 NOTE — Assessment & Plan Note (Signed)
hgba1c acceptable, minimize simple carbs. Increase exercise as tolerated.  

## 2021-02-17 ENCOUNTER — Other Ambulatory Visit: Payer: Self-pay | Admitting: Family Medicine

## 2021-03-03 ENCOUNTER — Other Ambulatory Visit: Payer: Self-pay | Admitting: Family Medicine

## 2021-03-03 ENCOUNTER — Other Ambulatory Visit: Payer: Self-pay | Admitting: Neurology

## 2021-03-03 DIAGNOSIS — D485 Neoplasm of uncertain behavior of skin: Secondary | ICD-10-CM | POA: Diagnosis not present

## 2021-03-03 DIAGNOSIS — L821 Other seborrheic keratosis: Secondary | ICD-10-CM | POA: Diagnosis not present

## 2021-03-03 DIAGNOSIS — I781 Nevus, non-neoplastic: Secondary | ICD-10-CM | POA: Diagnosis not present

## 2021-03-03 DIAGNOSIS — L570 Actinic keratosis: Secondary | ICD-10-CM | POA: Diagnosis not present

## 2021-03-03 DIAGNOSIS — D0439 Carcinoma in situ of skin of other parts of face: Secondary | ICD-10-CM | POA: Diagnosis not present

## 2021-03-05 ENCOUNTER — Telehealth: Payer: Self-pay | Admitting: Physical Medicine and Rehabilitation

## 2021-03-05 NOTE — Telephone Encounter (Signed)
Patient called. She would like an appointment with Dr. Ernestina Patches. Her call back number is [phone]

## 2021-03-10 ENCOUNTER — Telehealth: Payer: Self-pay | Admitting: Physical Medicine and Rehabilitation

## 2021-03-10 NOTE — Telephone Encounter (Signed)
Patient called back needing to schedule an appointment with Dr. Ernestina Patches. The number to contact patient is [phone]

## 2021-03-17 ENCOUNTER — Encounter: Payer: Self-pay | Admitting: Family Medicine

## 2021-03-17 ENCOUNTER — Ambulatory Visit (INDEPENDENT_AMBULATORY_CARE_PROVIDER_SITE_OTHER): Payer: Medicare Other | Admitting: Family Medicine

## 2021-03-17 VITALS — BP 108/66 | Ht 60.0 in | Wt 233.0 lb

## 2021-03-17 DIAGNOSIS — M899 Disorder of bone, unspecified: Secondary | ICD-10-CM

## 2021-03-17 DIAGNOSIS — M47818 Spondylosis without myelopathy or radiculopathy, sacral and sacrococcygeal region: Secondary | ICD-10-CM | POA: Diagnosis not present

## 2021-03-17 DIAGNOSIS — M8000XD Age-related osteoporosis with current pathological fracture, unspecified site, subsequent encounter for fracture with routine healing: Secondary | ICD-10-CM

## 2021-03-17 DIAGNOSIS — M25551 Pain in right hip: Secondary | ICD-10-CM

## 2021-03-17 NOTE — Assessment & Plan Note (Signed)
Improving.  Is getting lumbar facet injections soon. -Counseled on home exercise therapy and supportive care.

## 2021-03-17 NOTE — Assessment & Plan Note (Signed)
Acutely occurring.  Having pain on the medial and inferior borders. -Completed shockwave therapy today.

## 2021-03-17 NOTE — Assessment & Plan Note (Signed)
Acutely occurring intermittently.  Pain is occurring with walking over the lateral aspect. -Counseled on home exercise therapy and supportive care. -Could pursue shockwave therapy.

## 2021-03-17 NOTE — Progress Notes (Signed)
°  Amanda Copeland - 81 y.o. female MRN [id number]  Date of birth: 1940/04/01  SUBJECTIVE:  Including CC & ROS.  No chief complaint on file.   Amanda Copeland is a 81 y.o. female that is following up for her SI joint pain and presenting with new onset left periscapular pain.  Has been doing well.  She is scheduled to have some facet joint injections here soon for her low back pain.  Having intermittent right lateral hip pain.   Review of Systems See HPI   HISTORY: Past Medical, Surgical, Social, and Family History Reviewed & Updated per EMR.   Pertinent Historical Findings include:  Past Medical History:  Diagnosis Date   Arthritis    Asthma    childhood now returning   Back pain affecting pregnancy 11/02/2014   Benign essential tremor 10/28/2014   Bilateral dry eyes    Breast cancer ([place])    right   Cancer ([place])    Breast   Dry eyes    Encounter for Medicare annual wellness exam 05/22/2016   Essential hypertension, benign 10/28/2014   H/O measles    H/O mumps    History of chicken pox    Lumbago 11/02/2014   Mixed hyperlipidemia 10/28/2014   Obesity 10/28/2014   Stenosis of cervical spine     Past Surgical History:  Procedure Laterality Date   BACK SURGERY     BREAST REDUCTION SURGERY Left    CHOLECYSTECTOMY     EYE SURGERY  2008   b/l cataracts removed, in [place] Bilateral    ROTATOR CUFF REPAIR Left      PHYSICAL EXAM:  VS: BP 108/66 (BP Location: Left Arm, Patient Position: Sitting)    Ht 5' (1.524 m)    Wt 233 lb (105.7 kg)    BMI 45.50 kg/m  Physical Exam Gen: NAD, alert, cooperative with exam, well-appearing MSK:  Neurovascularly intact    ECSWT Note Annastyn Silvey [birth year]  Procedure: ECSWT Indications: left scapular pain  Procedure Details Consent: Risks of procedure as well as the alternatives and risks of each were explained to the (patient/caregiver).  Consent for  procedure obtained. Time Out: Verified patient identification, verified procedure, site/side was marked, verified correct patient position, special equipment/implants available, medications/allergies/relevent history reviewed, required imaging and test results available.  Performed.  The area was cleaned with iodine and alcohol swabs.    The left periscapular area was targeted for Extracorporeal shockwave therapy.   Preset: shoulder problem Power Level: 40 Frequency: 10 Impulse/cycles: 2000 Head size: large  Session: 1st  Patient did tolerate procedure well.    ASSESSMENT & PLAN:   SI joint arthritis Improving.  Is getting lumbar facet injections soon. -Counseled on home exercise therapy and supportive care.  Age-related osteoporosis with current pathological fracture Being treated with Prolia.  Greater trochanteric pain syndrome of right lower extremity Acutely occurring intermittently.  Pain is occurring with walking over the lateral aspect. -Counseled on home exercise therapy and supportive care. -Could pursue shockwave therapy.  Scapular dysfunction Acutely occurring.  Having pain on the medial and inferior borders. -Completed shockwave therapy today.

## 2021-03-17 NOTE — Assessment & Plan Note (Signed)
Being treated with Prolia.

## 2021-03-22 DIAGNOSIS — D0439 Carcinoma in situ of skin of other parts of face: Secondary | ICD-10-CM | POA: Diagnosis not present

## 2021-03-22 DIAGNOSIS — L820 Inflamed seborrheic keratosis: Secondary | ICD-10-CM | POA: Diagnosis not present

## 2021-03-25 ENCOUNTER — Other Ambulatory Visit: Payer: Self-pay

## 2021-03-25 ENCOUNTER — Ambulatory Visit: Payer: Self-pay

## 2021-03-25 ENCOUNTER — Ambulatory Visit (INDEPENDENT_AMBULATORY_CARE_PROVIDER_SITE_OTHER): Payer: Medicare Other | Admitting: Physical Medicine and Rehabilitation

## 2021-03-25 ENCOUNTER — Encounter: Payer: Self-pay | Admitting: Physical Medicine and Rehabilitation

## 2021-03-25 VITALS — BP 142/73 | HR 76

## 2021-03-25 DIAGNOSIS — M47816 Spondylosis without myelopathy or radiculopathy, lumbar region: Secondary | ICD-10-CM | POA: Diagnosis not present

## 2021-03-25 MED ORDER — BUPIVACAINE HCL 0.5 % IJ SOLN: 3.0000 mL | Freq: Once | INTRAMUSCULAR | Status: DC

## 2021-03-25 NOTE — Progress Notes (Signed)
Pt state lower back pain. Pt state walking makes the pain worse. Pt state she takes over the counter pain meds to help ease her pain.  Numeric Pain Rating Scale and Functional Assessment Average Pain 2   In the last MONTH (on 0-10 scale) has pain interfered with the following?  1. General activity like being  able to carry out your everyday physical activities such as walking, climbing stairs, carrying groceries, or moving a chair?  Rating(8)   +Driver, -BT, -Dye Allergies.

## 2021-03-25 NOTE — Patient Instructions (Signed)

## 2021-04-01 ENCOUNTER — Other Ambulatory Visit: Payer: Self-pay

## 2021-04-01 ENCOUNTER — Other Ambulatory Visit: Payer: Self-pay | Admitting: Family Medicine

## 2021-04-01 DIAGNOSIS — K219 Gastro-esophageal reflux disease without esophagitis: Secondary | ICD-10-CM

## 2021-04-01 MED ORDER — PREVACID 30 MG PO CPDR: DELAYED_RELEASE_CAPSULE | ORAL | 1 refills | Status: DC

## 2021-04-02 NOTE — Telephone Encounter (Addendum)
Called [employer] [place] at [phone] and spoke with [facility] (call ref# [id number]). After 40 minutes on the phone with rep [facility], she was unable to verify benefits/coverage for CPT/HCPCS codes [phone] and [phone]. Monica plans to call me back with benefit information.   Brayton Layman was able to confirm that prior authorization is required for CPT code [phone].

## 2021-04-02 NOTE — Telephone Encounter (Signed)
Prior auth initiated via fax.  [address] of [place] ([place] [phone]

## 2021-04-05 ENCOUNTER — Other Ambulatory Visit: Payer: Self-pay | Admitting: Family Medicine

## 2021-04-05 ENCOUNTER — Other Ambulatory Visit: Payer: Self-pay | Admitting: Neurology

## 2021-04-07 NOTE — Progress Notes (Signed)
Amanda Copeland - 80 y.o. female MRN [id number]  Date of birth: 1940/09/30  Office Visit Note: Visit Date: 03/25/2021 PCP: [name], MD Referred by: [name], MD  Subjective: Chief Complaint  Patient presents with   Lower Back - Pain   HPI:  Amanda Copeland is a 81 y.o. female who comes in today for planned repeat Bilateral L3-4, L4-5, and L5-S1 Lumbar facet/medial branch block with fluoroscopic guidance.  The patient has failed conservative care including home exercise, medications, time and activity modification.  This injection will be diagnostic and hopefully therapeutic.  Please see requesting physician notes for further details and justification.  Exam shows concordant low back pain with facet joint loading and extension. Patient received more than 80% pain relief from prior injection. This would be the second block in a diagnostic double block paradigm.     Referring:Dr. Joni Fears   ROS Otherwise per HPI.  Assessment & Plan: Visit Diagnoses:    ICD-10-CM   1. Spondylosis without myelopathy or radiculopathy, lumbar region  M47.816 XR C-ARM NO REPORT    Facet Injection    bupivacaine (MARCAINE) 0.5 % (with pres) injection 3 mL      Plan: No additional findings.   Meds & Orders:  Meds ordered this encounter  Medications   bupivacaine (MARCAINE) 0.5 % (with pres) injection 3 mL    Orders Placed This Encounter  Procedures   Facet Injection   XR C-ARM NO REPORT    Follow-up: Return if symptoms worsen or fail to improve.   Procedures: No procedures performed  Lumbar Diagnostic Facet Joint Nerve Block with Fluoroscopic Guidance   Patient: Amanda Copeland      Date of Birth: 1940/06/30 MRN: [id number] PCP: [name], MD      Visit Date: 03/25/2021   Universal Protocol:    Date/Time: 03/01/237:24 AM  Consent Given By: the patient  Position: PRONE  Additional Comments: Vital signs were monitored before and after the procedure. Patient was  prepped and draped in the usual sterile fashion. The correct patient, procedure, and site was verified.   Injection Procedure Details:   Procedure diagnoses:  1. Spondylosis without myelopathy or radiculopathy, lumbar region      Meds Administered:  Meds ordered this encounter  Medications   bupivacaine (MARCAINE) 0.5 % (with pres) injection 3 mL     Laterality: Bilateral  Location/Site: L3-L4, L2 and L3 medial branches, L4-L5, L3 and L4 medial branches, and L5-S1, L4 medial branch and L5 dorsal ramus  Needle: 5.0 in., 25 ga.  Short bevel or Quincke spinal needle  Needle Placement: Oblique pedical  Findings:   -Comments: There was excellent flow of contrast along the articular pillars without intravascular flow.  Procedure Details: The fluoroscope beam is vertically oriented in AP and then obliqued 15 to 20 degrees to the ipsilateral side of the desired nerve to achieve the Scotty dog appearance.  The skin over the target area of the junction of the superior articulating process and the transverse process (sacral ala if blocking the L5 dorsal rami) was locally anesthetized with a 1 ml volume of 1% Lidocaine without Epinephrine.  The spinal needle was inserted and advanced in a trajectory view down to the target.   After contact with periosteum and negative aspirate for blood and CSF, correct placement without intravascular or epidural spread was confirmed by injecting 0.5 ml. of Isovue-250.  A spot radiograph was obtained of this image.    Next, a 0.5 ml.  volume of the injectate described above was injected. The needle was then redirected to the other facet joint nerves mentioned above if needed.  Prior to the procedure, the patient was given a Pain Diary which was completed for baseline measurements.  After the procedure, the patient rated their pain every 30 minutes and will continue rating at this frequency for a total of 5 hours.  The patient has been asked to complete the  Diary and return to [place] by mail, fax or hand delivered as soon as possible.   Additional Comments:  The patient tolerated the procedure well Dressing: 2 x 2 sterile gauze and Band-Aid    Post-procedure details: Patient was observed during the procedure. Post-procedure instructions were reviewed.  Patient left the clinic in stable condition.    Clinical History: MRI LUMBAR SPINE WITHOUT CONTRAST   TECHNIQUE: Multiplanar, multisequence MR imaging of the lumbar spine was performed. No intravenous contrast was administered.   COMPARISON:  Radiography 02/28/2019.   FINDINGS: Segmentation:  5 lumbar type vertebral bodies.   Alignment: 3 mm degenerative anterolisthesis L4-5. 2 mm degenerative anterolisthesis L5-S1.   Vertebrae: Old superior endplate Schmorl's node at T11. No other focal bone finding.   Conus medullaris and cauda equina: Conus extends to the T12-L1 level. Conus and cauda equina appear normal.   Paraspinal and other soft tissues: Negative   Disc levels:   No significant finding at L1-2 or above.   L2-3: Mild bulging of the disc. Bilateral facet degeneration with joint effusions and mild upper trapeze. Mild narrowing of the lateral recesses but no visible neural compression   L3-4: Mild bulging of the disc. Mild facet and ligamentous hypertrophy. Mild stenosis of the lateral recesses but no visible neural compression.   L4-5: Bilateral facet arthropathy with 3 mm of anterolisthesis. Bulging of the disc. Stenosis of the canal and lateral recesses that could cause neural compression on either or both sides.   L5-S1: Facet osteoarthritis with 2 mm of anterolisthesis. No disc bulge or herniation. No compressive stenosis.   IMPRESSION: L4-5: Bilateral facet arthropathy with 3 mm of anterolisthesis. Bulging of the disc. Stenosis of the canal and lateral recesses that could cause neural compression on either or both sides. The facet arthropathy could  certainly contribute to back pain or referred facet syndrome pain.   L3-4: Disc bulge. Facet hypertrophy. Mild stenosis but no visible neural compression.   L2-3: Disc bulge. Facet osteoarthritis with joint effusions. Mild stenosis but no visible neural compression. The facet arthritis could be painful.     Electronically Signed   By: Nelson Chimes M.D.   On: 04/06/2019 04:44     Objective:  VS:  HT:     WT:    BMI:      BP:(!) 142/73   HR:76bpm   TEMP: ( )   RESP:  Physical Exam Vitals and nursing note reviewed.  Constitutional:      General: She is not in acute distress.    Appearance: Normal appearance. She is obese. She is not ill-appearing.  HENT:     Head: Normocephalic and atraumatic.     Right Ear: External ear normal.     Left Ear: External ear normal.  Eyes:     Extraocular Movements: Extraocular movements intact.  Cardiovascular:     Rate and Rhythm: Normal rate.     Pulses: Normal pulses.  Pulmonary:     Effort: Pulmonary effort is normal. No respiratory distress.  Abdominal:     General:  There is no distension.     Palpations: Abdomen is soft.  Musculoskeletal:        General: Tenderness present.     Cervical back: Neck supple.     Right lower leg: No edema.     Left lower leg: No edema.     Comments: Patient has good distal strength with no pain over the greater trochanters.  No clonus or focal weakness. Patient somewhat slow to rise from a seated position to full extension.  There is concordant low back pain with facet loading and lumbar spine extension rotation.  There are no definitive trigger points but the patient is somewhat tender across the lower back and PSIS.  There is no pain with hip rotation.   Skin:    Findings: No erythema, lesion or rash.  Neurological:     General: No focal deficit present.     Mental Status: She is alert and oriented to person, place, and time.     Sensory: No sensory deficit.     Motor: No weakness or abnormal muscle  tone.     Coordination: Coordination normal.  Psychiatric:        Mood and Affect: Mood normal.        Behavior: Behavior normal.     Imaging: No results found.

## 2021-04-07 NOTE — Procedures (Signed)
Lumbar Diagnostic Facet Joint Nerve Block with Fluoroscopic Guidance   Patient: Amanda Copeland      Date of Birth: [birth year] MRN: [id number] PCP: [name], MD      Visit Date: 03/25/2021   Universal Protocol:    Date/Time: 03/01/237:24 AM  Consent Given By: the patient  Position: PRONE  Additional Comments: Vital signs were monitored before and after the procedure. Patient was prepped and draped in the usual sterile fashion. The correct patient, procedure, and site was verified.   Injection Procedure Details:   Procedure diagnoses:  1. Spondylosis without myelopathy or radiculopathy, lumbar region      Meds Administered:  Meds ordered this encounter  Medications   bupivacaine (MARCAINE) 0.5 % (with pres) injection 3 mL     Laterality: Bilateral  Location/Site: L3-L4, L2 and L3 medial branches, L4-L5, L3 and L4 medial branches, and L5-S1, L4 medial branch and L5 dorsal ramus  Needle: 5.0 in., 25 ga.  Short bevel or Quincke spinal needle  Needle Placement: Oblique pedical  Findings:   -Comments: There was excellent flow of contrast along the articular pillars without intravascular flow.  Procedure Details: The fluoroscope beam is vertically oriented in AP and then obliqued 15 to 20 degrees to the ipsilateral side of the desired nerve to achieve the Scotty dog appearance.  The skin over the target area of the junction of the superior articulating process and the transverse process (sacral ala if blocking the L5 dorsal rami) was locally anesthetized with a 1 ml volume of 1% Lidocaine without Epinephrine.  The spinal needle was inserted and advanced in a trajectory view down to the target.   After contact with periosteum and negative aspirate for blood and CSF, correct placement without intravascular or epidural spread was confirmed by injecting 0.5 ml. of Isovue-250.  A spot radiograph was obtained of this image.    Next, a 0.5 ml. volume of the injectate described  above was injected. The needle was then redirected to the other facet joint nerves mentioned above if needed.  Prior to the procedure, the patient was given a Pain Diary which was completed for baseline measurements.  After the procedure, the patient rated their pain every 30 minutes and will continue rating at this frequency for a total of 5 hours.  The patient has been asked to complete the Diary and return to [place] by mail, fax or hand delivered as soon as possible.   Additional Comments:  The patient tolerated the procedure well Dressing: 2 x 2 sterile gauze and Band-Aid    Post-procedure details: Patient was observed during the procedure. Post-procedure instructions were reviewed.  Patient left the clinic in stable condition.

## 2021-04-11 NOTE — Progress Notes (Signed)
Cardiology Office Note   Date:  04/12/2021   ID:  Amanda Copeland, DOB 09/19/1940, MRN [id number]  PCP:  [name], MD    No chief complaint on file.  RF for CAD  Wt Readings from Last 3 Encounters:  04/12/21 228 lb 12.8 oz (103.8 kg)  03/17/21 233 lb (105.7 kg)  02/16/21 233 lb 9.6 oz (106 kg)       History of Present Illness: Amanda Copeland is a 81 y.o. female   Who has RF for CAD.  SHe has had a stress test several years ago that was normal.   She is intolerant to statins to muscle pain and fatigue.  SHe took atorvastatin.  She also tried another, maybe pravastatin but did not tolerate this.    It was recommended that she follow the DASH diet for weight loss.    In 2018, She saw pulmonary.  She was told her lungs are ok.   Lived in [place], then moved to the [place] area.    She never tried the DASH diet.     Chronic SHOB-visit with PMD in 2021:"  PFT was unremarkable, and no improvement with inhaler therapy." She also decline home sleep test.    Coronary CT in 2021 shosed: "Coronary calcium score of 26. This was 67 percentile for age and sex matched control.   2. Normal coronary origin with right dominance.   3. CAD-RADS 1. Minimal non-obstructive CAD (0-24%). Consider non-atherosclerotic causes of chest pain. Consider preventive therapy and risk factor modification."   2021 CPX test: "Conclusion: The interpretation of this test is limited due to submaximal effort during the exercise. Based on available data, exercise testing with gas exchange demonstrates mildly reduced functional capacity when compared to matched sedentary norms.Patient appears with ventilatory limitation related to her body habitus. There is no clear cardiopulmonary limitation. VE/VCO2 slope is elevated and suggestive of increased pulmonary pressures during exercise. There was also chronotropic incompetence most likely related to submaximal effort. "   In July 2022, [place] and broke her leg-did  not pass out.  She was trying to get into a pool in her legs buckled.  She had surgery, plate with nine screws.  She went to rehab for 50 days.   Uses a walker now.  Did PT- still has some problems walking.   No more falls. Balance has decreased.  Does have access to stationary bike.    Does water aerobics in outdoor pool in [place].    Feels tired.  Got COVID on a cruise ship- on     Past Medical History:  Diagnosis Date   Arthritis    Asthma    childhood now returning   Back pain affecting pregnancy 11/02/2014   Benign essential tremor 10/28/2014   Bilateral dry eyes    Breast cancer ([place])    right   Cancer ([place])    Breast   Dry eyes    Encounter for Medicare annual wellness exam 05/22/2016   Essential hypertension, benign 10/28/2014   H/O measles    H/O mumps    History of chicken pox    Lumbago 11/02/2014   Mixed hyperlipidemia 10/28/2014   Obesity 10/28/2014   Stenosis of cervical spine     Past Surgical History:  Procedure Laterality Date   BACK SURGERY     BREAST REDUCTION SURGERY Left    CHOLECYSTECTOMY     EYE SURGERY  2008   b/l cataracts removed, in [place]  Right    REDUCTION MAMMAPLASTY     REPLACEMENT TOTAL KNEE BILATERAL Bilateral    ROTATOR CUFF REPAIR Left      Current Outpatient Medications  Medication Sig Dispense Refill   albuterol (PROVENTIL HFA;VENTOLIN HFA) 108 (90 Base) MCG/ACT inhaler INHALE 2 PUFFS INTO THE LUNGS EVERY 4 (FOUR) HOURS AS NEEDED FOR WHEEZING OR SHORTNESS OF BREATH. 6.7 Inhaler 1   allopurinol (ZYLOPRIM) 100 MG tablet TAKE 2 TABLETS BY MOUTH EVERY DAY 180 tablet 1   AMBULATORY NON FORMULARY MEDICATION Rollator  Dx: Gait instability 1 Device 0   amLODipine (NORVASC) 5 MG tablet Take 1 tablet (5 mg total) by mouth daily. 90 tablet 3   Ascorbic Acid (VITAMIN C PO) Take 1 tablet by mouth daily.      aspirin 81 MG tablet Take 81 mg by mouth daily.     DYMISTA 137-50 MCG/ACT SUSP 1-2 SPRAYS PER NOSTRIL TWICE DAILY FOR  RUNNY NOSE 69 g 1   ezetimibe (ZETIA) 10 MG tablet TAKE 1 TABLET BY MOUTH EVERY DAY 90 tablet 1   fluticasone (FLOVENT HFA) 110 MCG/ACT inhaler 2 PUFFS TWICE DAILY TO PREVENT COUGHING OR WHEEZING. MAX INS WILL COVER IS A 30 DAY 36 each 1   furosemide (LASIX) 40 MG tablet TAKE 1 TABLET BY MOUTH EVERY DAY 90 tablet 3   KLOR-CON M20 20 MEQ tablet TAKE 1 TABLET BY MOUTH DAILY. KEEP UPCOMING APPOINTMENT 90 tablet 3   levothyroxine (SYNTHROID) 112 MCG tablet TAKE 1 TABLET BY MOUTH EVERY DAY BEFORE BREAKFAST 90 tablet 1   Multiple Vitamins-Minerals (ICAPS AREDS 2) CAPS Take 1 capsule by mouth 2 (two) times daily.     PREVACID 30 MG capsule TAKE 1 CAPSULE BY MOUTH TWICE A DAY --MAX LIMIT 180 capsule 1   promethazine-dextromethorphan (PROMETHAZINE-DM) 6.25-15 MG/5ML syrup Take 2.5-5 mLs by mouth 3 (three) times daily as needed for cough. 160 mL 0   propranolol (INDERAL) 40 MG tablet TAKE 1 TABLET BY MOUTH TWICE A DAY 180 tablet 1   Red Yeast Rice Extract (RED YEAST RICE PO) Take by mouth daily.     Vitamin D, Ergocalciferol, (DRISDOL) 1.25 MG (50000 UNIT) CAPS capsule Take 1 capsule (50,000 Units total) by mouth every 7 (seven) days. Take for 8 total doses(weeks) 8 capsule 0   colchicine 0.6 MG tablet Take 2 tabs at first and then 1 tab q 2 hours til pain relief or max of 6 tabs in 24 hours (Patient not taking: Reported on 04/12/2021) 6 tablet 1   doxycycline (VIBRA-TABS) 100 MG tablet Take 1 tablet (100 mg total) by mouth 2 (two) times daily. (Patient not taking: Reported on 04/12/2021) 20 tablet 0   Current Facility-Administered Medications  Medication Dose Route Frequency Provider Last Rate Last Admin   bupivacaine (MARCAINE) 0.5 % (with pres) injection 3 mL  3 mL Other Once Magnus Sinning, MD        Allergies:   Amoxicillin, Ampicillin, Penicillins, and Statins    Social History:  The patient  reports that she has never smoked. She has never used smokeless tobacco. She reports current alcohol use.  She reports that she does not use drugs.   Family History:  The patient's family history includes Appendicitis in her paternal grandmother; Asthma in her father; Cancer in her father and maternal grandmother; Heart attack in her mother; Heart disease in her maternal grandfather and mother; Heart failure in her mother; Hypertension in her mother; Obesity in her daughter; Parkinson's disease in her father.  ROS:  Please see the history of present illness.   Otherwise, review of systems are positive for fatigue.   All other systems are reviewed and negative.    PHYSICAL EXAM: VS:  BP 118/82    Pulse (!) 48    Ht '5\' 1"'$  (1.549 m)    Wt 228 lb 12.8 oz (103.8 kg)    SpO2 96%    BMI 43.23 kg/m  , BMI Body mass index is 43.23 kg/m. GEN: Well nourished, well developed, in no acute distress HEENT: normal Neck: no JVD, carotid bruits, or masses Cardiac: RRR; no murmurs, rubs, or gallops,no edema  Respiratory:  clear to auscultation bilaterally, normal work of breathing GI: soft, nontender, nondistended, + BS, obese MS: no deformity or atrophy Skin: warm and dry, no rash Neuro:  Strength and sensation are intact Psych: euthymic mood, full affect   EKG:   The ekg ordered 11/2020 demonstrates sinus bradycardia, no ST changes   Recent Labs: 02/16/2021: ALT 11; BUN 19; Creatinine, Ser 0.98; Hemoglobin 13.5; Platelets 371.0; Potassium 4.2; Sodium 139; TSH 1.71   Lipid Panel    Component Value Date/Time   CHOL 189 02/16/2021 1213   TRIG 127.0 02/16/2021 1213   HDL 51.60 02/16/2021 1213   CHOLHDL 4 02/16/2021 1213   VLDL 25.4 02/16/2021 1213   LDLCALC 112 (H) 02/16/2021 1213   LDLDIRECT 124.0 11/10/2020 1634     Other studies Reviewed: Additional studies/ records that were reviewed today with results demonstrating: labs reviewed.   ASSESSMENT AND PLAN:  Chronic diastolic heart failure: Appears euvolemic.  Continue furosemide.  Potassium and creatinine well controlled in January 2023.   Avoid processed foods.  Avoid excessive salt. Hypertensive heart disease: The current medical regimen is effective;  continue present plan and medications. Hyperlipidemia: LDL 122 in 02/2021.  Continue ezetimibe.  Whole food, plant-based diet recommended. DOE: Needs to increase.  May be related to fairly recent COVID infection, although per her description it was mild Snoring: We discussed home sleep study.  Dr. Halford Chessman also recommended.  She has been avoiding.     Current medicines are reviewed at length with the patient today.  The patient concerns regarding her medicines were addressed.  The following changes have been made:  No change  Labs/ tests ordered today include:  No orders of the defined types were placed in this encounter.   Recommend 150 minutes/week of aerobic exercise Low fat, low carb, high fiber diet recommended  Disposition:   FU in 1 year   Signed, Larae Grooms, MD  04/12/2021 9:37 AM    [facility] [place], [place], [place]  [address] Phone: [phone]; Fax: [phone]

## 2021-04-12 ENCOUNTER — Encounter: Payer: Self-pay | Admitting: Interventional Cardiology

## 2021-04-12 ENCOUNTER — Other Ambulatory Visit: Payer: Self-pay

## 2021-04-12 ENCOUNTER — Ambulatory Visit (INDEPENDENT_AMBULATORY_CARE_PROVIDER_SITE_OTHER): Payer: Medicare Other | Admitting: Interventional Cardiology

## 2021-04-12 VITALS — BP 118/82 | HR 48 | Ht 61.0 in | Wt 228.8 lb

## 2021-04-12 DIAGNOSIS — R5382 Chronic fatigue, unspecified: Secondary | ICD-10-CM

## 2021-04-12 DIAGNOSIS — R0683 Snoring: Secondary | ICD-10-CM | POA: Diagnosis not present

## 2021-04-12 DIAGNOSIS — E782 Mixed hyperlipidemia: Secondary | ICD-10-CM | POA: Diagnosis not present

## 2021-04-12 DIAGNOSIS — I110 Hypertensive heart disease with heart failure: Secondary | ICD-10-CM

## 2021-04-12 DIAGNOSIS — I5032 Chronic diastolic (congestive) heart failure: Secondary | ICD-10-CM

## 2021-04-12 NOTE — Patient Instructions (Signed)
Medication Instructions:  Your physician recommends that you continue on your current medications as directed. Please refer to the Current Medication list given to you today.  *If you need a refill on your cardiac medications before your next appointment, please call your pharmacy*   Lab Work: none If you have labs (blood work) drawn today and your tests are completely normal, you will receive your results only by: MyChart Message (if you have MyChart) OR A paper copy in the mail If you have any lab test that is abnormal or we need to change your treatment, we will call you to review the results.   Testing/Procedures: none   Follow-Up: At [facility], you and your health needs are our priority.  As part of our continuing mission to provide you with exceptional heart care, we have created designated Provider Care Teams.  These Care Teams include your primary Cardiologist (physician) and Advanced Practice Providers (APPs -  Physician Assistants and Nurse Practitioners) who all work together to provide you with the care you need, when you need it.  We recommend signing up for the patient portal called "MyChart".  Sign up information is provided on this After Visit Summary.  MyChart is used to connect with patients for Virtual Visits (Telemedicine).  Patients are able to view lab/test results, encounter notes, upcoming appointments, etc.  Non-urgent messages can be sent to your provider as well.   To learn more about what you can do with MyChart, go to [email or web address].    Your next appointment:   12 month(s)  The format for your next appointment:   In Person  Provider:   Jayadeep Varanasi, MD     Other Instructions  High-Fiber Eating Plan Fiber, also called dietary fiber, is a type of carbohydrate. It is found foods such as fruits, vegetables, whole grains, and beans. A high-fiber diet can have many health benefits. Your health care provider may recommend a high-fiber diet  to help: Prevent constipation. Fiber can make your bowel movements more regular. Lower your cholesterol. Relieve the following conditions: Inflammation of veins in the anus (hemorrhoids). Inflammation of specific areas of the digestive tract (uncomplicated diverticulosis). A problem of the large intestine, also called the colon, that sometimes causes pain and diarrhea (irritable bowel syndrome, or IBS). Prevent overeating as part of a weight-loss plan. Prevent heart disease, type 2 diabetes, and certain cancers. What are tips for following this plan? Reading food labels  Check the nutrition facts label on food products for the amount of dietary fiber. Choose foods that have 5 grams of fiber or more per serving. The goals for recommended daily fiber intake include: Men (age 50 or younger): 34-38 g. Men (over age 50): 28-34 g. Women (age 50 or younger): 25-28 g. Women (over age 50): 22-25 g. Your daily fiber goal is _____________ g. Shopping Choose whole fruits and vegetables instead of processed forms, such as apple juice or applesauce. Choose a wide variety of high-fiber foods such as avocados, lentils, oats, and kidney beans. Read the nutrition facts label of the foods you choose. Be aware of foods with added fiber. These foods often have high sugar and sodium amounts per serving. Cooking Use whole-grain flour for baking and cooking. Cook with brown rice instead of white rice. Meal planning Start the day with a breakfast that is high in fiber, such as a cereal that contains 5 g of fiber or more per serving. Eat breads and cereals that are made with whole-grain flour instead of   refined flour or white flour. Eat brown rice, bulgur wheat, or millet instead of white rice. Use beans in place of meat in soups, salads, and pasta dishes. Be sure that half of the grains you eat each day are whole grains. General information You can get the recommended daily intake of dietary fiber  by: Eating a variety of fruits, vegetables, grains, nuts, and beans. Taking a fiber supplement if you are not able to take in enough fiber in your diet. It is better to get fiber through food than from a supplement. Gradually increase how much fiber you consume. If you increase your intake of dietary fiber too quickly, you may have bloating, cramping, or gas. Drink plenty of water to help you digest fiber. Choose high-fiber snacks, such as berries, raw vegetables, nuts, and popcorn. What foods should I eat? Fruits Berries. Pears. Apples. Oranges. Avocado. Prunes and raisins. Dried figs. Vegetables Sweet potatoes. Spinach. Kale. Artichokes. Cabbage. Broccoli. Cauliflower. Green peas. Carrots. Squash. Grains Whole-grain breads. Multigrain cereal. Oats and oatmeal. Brown rice. Barley. Bulgur wheat. Millet. Quinoa. Bran muffins. Popcorn. Rye wafer crackers. Meats and other proteins Navy beans, kidney beans, and pinto beans. Soybeans. Split peas. Lentils. Nuts and seeds. Dairy Fiber-fortified yogurt. Beverages Fiber-fortified soy milk. Fiber-fortified orange juice. Other foods Fiber bars. The items listed above may not be a complete list of recommended foods and beverages. Contact a dietitian for more information. What foods should I avoid? Fruits Fruit juice. Cooked, strained fruit. Vegetables Fried potatoes. Canned vegetables. Well-cooked vegetables. Grains White bread. Pasta made with refined flour. White rice. Meats and other proteins Fatty cuts of meat. Fried chicken or fried fish. Dairy Milk. Yogurt. Cream cheese. Sour cream. Fats and oils Butters. Beverages Soft drinks. Other foods Cakes and pastries. The items listed above may not be a complete list of foods and beverages to avoid. Talk with your dietitian about what choices are best for you. Summary Fiber is a type of carbohydrate. It is found in foods such as fruits, vegetables, whole grains, and beans. A high-fiber  diet has many benefits. It can help to prevent constipation, lower blood cholesterol, aid weight loss, and reduce your risk of heart disease, diabetes, and certain cancers. Increase your intake of fiber gradually. Increasing fiber too quickly may cause cramping, bloating, and gas. Drink plenty of water while you increase the amount of fiber you consume. The best sources of fiber include whole fruits and vegetables, whole grains, nuts, seeds, and beans. This information is not intended to replace advice given to you by your health care provider. Make sure you discuss any questions you have with your health care provider. Document Revised: 05/30/2019 Document Reviewed: 05/30/2019 Elsevier Patient Education  2022 Elsevier Inc.   

## 2021-04-22 NOTE — Telephone Encounter (Signed)
Called [employer] [place] to verify benefits, they are closed daily from 12:00-1:00.  ?

## 2021-04-22 NOTE — Telephone Encounter (Signed)
Prior auth approved ?[id number], case [id number] ?Valid 04/02/21-04/01/22 ? ? ?

## 2021-04-27 DIAGNOSIS — H5319 Other subjective visual disturbances: Secondary | ICD-10-CM | POA: Diagnosis not present

## 2021-04-27 DIAGNOSIS — H35453 Secondary pigmentary degeneration, bilateral: Secondary | ICD-10-CM | POA: Diagnosis not present

## 2021-04-27 DIAGNOSIS — H35363 Drusen (degenerative) of macula, bilateral: Secondary | ICD-10-CM | POA: Diagnosis not present

## 2021-04-27 DIAGNOSIS — H353121 Nonexudative age-related macular degeneration, left eye, early dry stage: Secondary | ICD-10-CM | POA: Diagnosis not present

## 2021-04-27 DIAGNOSIS — H04123 Dry eye syndrome of bilateral lacrimal glands: Secondary | ICD-10-CM | POA: Diagnosis not present

## 2021-04-27 DIAGNOSIS — H353112 Nonexudative age-related macular degeneration, right eye, intermediate dry stage: Secondary | ICD-10-CM | POA: Diagnosis not present

## 2021-04-29 NOTE — Telephone Encounter (Signed)
Pt ready for scheduling on or after 04/29/21 ? ?Out-of-pocket cost due at time of visit: $60 ? ?Primary: Medicare ?Prolia co-insurance: 20% (approximately $276) ?Admin fee co-insurance: 20% (approximately $25) ? ?Secondary: [place] ?Prolia co-insurance: 20% (approx $55) ?Admin fee co-insurance: 20% (approx $5) ? ?Deductible: deductible has been met ? ?Prior Auth:  ?PA# [id number], case [id number] ?Valid 04/02/21-04/01/22 ? ?** This summary of benefits is an estimation of the patient's out-of-pocket cost. Exact cost may vary based on individual plan coverage.  ? ?

## 2021-04-29 NOTE — Telephone Encounter (Signed)
Called [employer] [place] for benefits.  ? ?Advised that deductible has been met.  ?Patient will have 20% co-insurance.  ?Once OOP max has been satisfied, Prolia will be covered at 100% ?

## 2021-04-29 NOTE — Telephone Encounter (Signed)
Left message for patient to call back  

## 2021-04-30 NOTE — Telephone Encounter (Signed)
Pt informed of below.  She had her last Prolia injection 01/20/21. She will call us back at the end of May 2023 to touch base and get scheduled then.  ?

## 2021-05-03 ENCOUNTER — Encounter: Payer: Self-pay | Admitting: Physical Medicine and Rehabilitation

## 2021-05-03 DIAGNOSIS — M48062 Spinal stenosis, lumbar region with neurogenic claudication: Secondary | ICD-10-CM

## 2021-05-03 DIAGNOSIS — M47816 Spondylosis without myelopathy or radiculopathy, lumbar region: Secondary | ICD-10-CM

## 2021-05-04 NOTE — Telephone Encounter (Signed)
MRI of the lumbar spine ordered today.  Patient did have some level of stenosis at L4-5 with listhesis.  First injection was epidural injection that seemed to help and then the second injection was facet joint block.  Most recent facet joint block was not as beneficial but we would need follow-up after MRI just to see how much that helped if it was temporary.  Sometimes there is confusion on how much it helped and were looking at potential for ablation.  She will need follow-up after the MRI. ?

## 2021-05-10 ENCOUNTER — Ambulatory Visit
Admission: RE | Admit: 2021-05-10 | Discharge: 2021-05-10 | Disposition: A | Discharge status: Home / Self Care | Payer: Medicare Other | Source: Ambulatory Visit | Attending: Physical Medicine and Rehabilitation | Admitting: Physical Medicine and Rehabilitation

## 2021-05-10 ENCOUNTER — Telehealth: Payer: Self-pay | Admitting: Physical Medicine and Rehabilitation

## 2021-05-10 DIAGNOSIS — M48061 Spinal stenosis, lumbar region without neurogenic claudication: Secondary | ICD-10-CM | POA: Diagnosis not present

## 2021-05-10 DIAGNOSIS — M47816 Spondylosis without myelopathy or radiculopathy, lumbar region: Secondary | ICD-10-CM | POA: Diagnosis not present

## 2021-05-10 DIAGNOSIS — M545 Low back pain, unspecified: Secondary | ICD-10-CM | POA: Diagnosis not present

## 2021-05-12 NOTE — Telephone Encounter (Signed)
As per notes

## 2021-05-18 ENCOUNTER — Encounter: Payer: Self-pay | Admitting: Physical Medicine and Rehabilitation

## 2021-05-18 ENCOUNTER — Ambulatory Visit (INDEPENDENT_AMBULATORY_CARE_PROVIDER_SITE_OTHER): Payer: Medicare Other | Admitting: Physical Medicine and Rehabilitation

## 2021-05-18 VITALS — BP 138/62 | HR 76

## 2021-05-18 DIAGNOSIS — M5416 Radiculopathy, lumbar region: Secondary | ICD-10-CM | POA: Diagnosis not present

## 2021-05-18 DIAGNOSIS — M4726 Other spondylosis with radiculopathy, lumbar region: Secondary | ICD-10-CM

## 2021-05-18 DIAGNOSIS — M47816 Spondylosis without myelopathy or radiculopathy, lumbar region: Secondary | ICD-10-CM

## 2021-05-18 DIAGNOSIS — M48062 Spinal stenosis, lumbar region with neurogenic claudication: Secondary | ICD-10-CM

## 2021-05-18 NOTE — Progress Notes (Signed)
? ?Amanda Copeland - 81 y.o. female MRN [id number]  Date of birth: 04-Nov-1940 ? ?Office Visit Note: ?Visit Date: 05/18/2021 ?PCP: [name], MD ?Referred by: [name], MD ? ?Subjective: ?Chief Complaint  ?Patient presents with  ? Lower Back - Pain  ? ?HPI: Amanda Copeland is a 81 y.o. female who comes in today for evaluation of chronic, worsening and severe bilateral lower back pain radiating to buttocks, left greater than right. Patients husband accompanies her during visit today. Patient reports pain has been ongoing for several years. Patient states her pain causes extreme difficulty walking and standing, states she is only able to walk short distances. She describes her pain as a constant sore and aching sensation, reports she feels like her legs are going to "give out." Patient reports some relief of pain with rest and use of over the counter medications as needed. Patient also reports history of formal physical therapy in 2021 at [facility], she reports some relief of pain with these treatments. Patients recent lumbar MRI exhibits multi-level facet hypertrophy, most prominent at L4-L5 where there is also  severe spinal canal stenosis. Patient received bilateral L3-L4, L4-L5, and L5-S1 facet joint/medial branch blocks on 03/25/2021 and reports minimal relief of pain with this procedure. Patient states she got the most pain relief from her first injection that she had in our office, which was right L5-S1 interlaminar epidural steroid injection in 2021. Patient states her pain has altered sides throughout the years, however left side is causing her more pain at this time. Patient reports history of lumbar surgery 30 plus years ago in [place]. Patient denies focal weakness, numbness and tingling. Patient denies recent trauma or falls.  ? ?Review of Systems  ?Musculoskeletal:  Positive for back pain.  ?Neurological:  Negative for tingling, sensory change, focal weakness and weakness.   ?All other systems reviewed and are negative. Otherwise per HPI. ? ?Assessment & Plan: ?Visit Diagnoses:  ?  ICD-10-CM   ?1. Lumbar radiculopathy  M54.16 Ambulatory referral to Physical Medicine Rehab  ?  ?2. Spinal stenosis of lumbar region with neurogenic claudication  M48.062 Ambulatory referral to Physical Medicine Rehab  ?  ?3. Other spondylosis with radiculopathy, lumbar region  M47.26 Ambulatory referral to Physical Medicine Rehab  ?  ?4. Facet arthropathy, lumbar  M47.816 Ambulatory referral to Physical Medicine Rehab  ?  ?   ?Plan: Findings:  ?Chronic, worsening and severe bilateral lower back pain radiating to buttocks, left greater than right. Patient continues to have severe pain despite good conservative therapies such as formal physical therapy, rest and use of over the counter medications. Patients clinical presentation and exam are consistent with neurogenic claudication as a result of spinal canal stenosis. I did review recent lumbar MRI with patient today using images and spine model. Patient continues to have extreme pain with prolonged standing and walking, severe multi-factorial stenosis noted at L4-L5 on recent lumbar MRI. We believe the next step is to perform a diagnostic and hopefully therapeutic left L5-S1 interlaminar epidural steroid injection under fluoroscopic guidance. Patient is not currently on long term anticoagulant therapy. Patient is agreeable with plan and has no questions at this time. No red flag symptoms noted upon exam.   ? ?Meds & Orders: No orders of the defined types were placed in this encounter. ?  ?Orders Placed This Encounter  ?Procedures  ? Ambulatory referral to Physical Medicine Rehab  ?  ?Follow-up: Return for Left L5-S1 interlaminar epidural steroid  injection.  ? ?Procedures: ?No procedures performed  ?   ? ?Clinical History: ?EXAM: ?MRI LUMBAR SPINE WITHOUT CONTRAST ?  ?TECHNIQUE: ?Multiplanar, multisequence MR imaging of the lumbar spine was ?performed. No  intravenous contrast was administered. ?  ?COMPARISON:  MRI of the lumbar spine April 05, 2019. ?  ?FINDINGS: ?Segmentation:  Standard. ?  ?Alignment: Small anterolisthesis of L4 over L5 and L5 over S1, ?unchanged. ?  ?Vertebrae: No acute fracture, evidence of discitis, or bone lesion. ?Chronic compression fracture of the T11 superior endplate with ?associated Schmorl node, unchanged. Congenitally small spinal canal. ?  ?Conus medullaris and cauda equina: Conus extends to the T12-L1 ?level. Conus and cauda equina appear normal. ?  ?Paraspinal and other soft tissues: Negative. ?  ?Disc levels: ?  ?T12-L1: No spinal canal or neural foraminal stenosis. ?  ?L1-2: Shallow disc bulge and mild facet degenerative changes ?resulting in mild bilateral neural foraminal narrowing. ?  ?L2-3: Disc bulge with superimposed small central disc protrusion, ?moderate hypertrophic facet degenerative changes with bilateral ?joint effusion and ligamentum flavum redundancy resulting in mild ?spinal canal stenosis with narrowing of the bilateral subarticular ?zones, moderate right and moderate to severe left neural foraminal ?narrowing. ?  ?L3-4: Disc bulge, moderate to advanced hypertrophic facet ?degenerative changes and ligamentum flavum redundancy resulting in ?mild spinal canal stenosis with narrowing of the bilateral ?subarticular zones, mild right and moderate left neural foraminal ?narrowing. Left neural foraminal narrowing appear progressed since ?prior MRI. ?  ?L4-5: Anterolisthesis, disc bulge with new left foraminal disc ?protrusion, prominent hypertrophic facet degenerative changes and ?ligamentum flavum redundancy resulting in severe spinal canal ?stenosis, moderate right and moderate to severe left neural ?foraminal narrowing. ?  ?L5-S1: Prominent loss of disc height, shallow disc bulge and ?moderate hypertrophic facet degenerative changes without significant ?spinal canal or neural foraminal stenosis. ?  ?IMPRESSION: ?1.  Severe spinal canal stenosis at L4-5 with moderate right and ?moderate to severe left neural foraminal narrowing. ?2. Mild spinal canal stenosis with narrowing of the bilateral ?subarticular zones at L3-4 with mild right and moderate left neural ?foraminal narrowing. ?3. Mild spinal canal stenosis with narrowing of the bilateral ?subarticular zones at L2-3 with moderate right and moderate to ?severe left neural foraminal narrowing. ?  ?  ?Electronically Signed ?  By: Pedro Earls M.D. ?  On: 05/10/2021 14:09  ? ?She reports that she has never smoked. She has never used smokeless tobacco.  ?Recent Labs  ?  11/10/20 ?1634 02/16/21 ?1541  ?HGBA1C 5.7 5.6  ? ? ?Objective:  VS:  HT:    WT:   BMI:     BP:138/62  HR:76bpm  TEMP: ( )  RESP:  ?Physical Exam ?Vitals and nursing note reviewed.  ?HENT:  ?   Head: Normocephalic and atraumatic.  ?   Right Ear: External ear normal.  ?   Left Ear: External ear normal.  ?   Nose: Nose normal.  ?   Mouth/Throat:  ?   Mouth: Mucous membranes are moist.  ?Eyes:  ?   Extraocular Movements: Extraocular movements intact.  ?Cardiovascular:  ?   Rate and Rhythm: Normal rate.  ?   Pulses: Normal pulses.  ?Pulmonary:  ?   Effort: Pulmonary effort is normal.  ?Abdominal:  ?   General: Abdomen is flat. There is no distension.  ?Musculoskeletal:     ?   General: Tenderness present.  ?   Cervical back: Normal range of motion.  ?   Comments: Pt  is slow to rise from seated position to standing. Good lumbar range of motion. Strong distal strength without clonus, no pain upon palpation of greater trochanters. Sensation intact bilaterally. Walks independently, gait steady.   ?Skin: ?   General: Skin is warm and dry.  ?   Capillary Refill: Capillary refill takes less than 2 seconds.  ?Neurological:  ?   General: No focal deficit present.  ?   Mental Status: She is alert and oriented to person, place, and time.  ?Psychiatric:     ?   Mood and Affect: Mood normal.     ?   Behavior:  Behavior normal.  ?  ?Ortho Exam ? ?Imaging: ?No results found. ? ?Past Medical/Family/Surgical/Social History: ?Medications & Allergies reviewed per EMR, new medications updated. ?Patient Active Problem List  ?

## 2021-05-18 NOTE — Progress Notes (Signed)
Pt here today for an review of her MRI. Pt state lower back pain. Pt state walking makes the pain worse. Pt state she takes over the counter pain meds to help ease her pain. ? ?Numeric Pain Rating Scale and Functional Assessment ?Average Pain 10 ?Pain Right Now 2 ?My pain is intermittent, sharp, and aching ?Pain is worse with: walking and some activites ?Pain improves with: medication ? ? ?In the last MONTH (on 0-10 scale) has pain interfered with the following? ? ?1. General activity like being  able to carry out your everyday physical activities such as walking, climbing stairs, carrying groceries, or moving a chair?  ?Rating(5) ? ?2. Relation with others like being able to carry out your usual social activities and roles such as  activities at home, at work and in your community. ?Rating(6) ? ?3. Enjoyment of life such that you have  been bothered by emotional problems such as feeling anxious, depressed or irritable?  ?Rating(7) ? ?

## 2021-05-24 NOTE — Progress Notes (Signed)
? ?Subjective:  ? ? Patient ID: Amanda Copeland, female    DOB: [name] 09, 1941, 81 y.o.   MRN: [id number] ? ?Chief Complaint  ?Patient presents with  ? Follow-up  ? Neck Pain  ?  Left side  ? Hearing Problem  ? ? ?HPI ?Patient is in today for a follow up. She is accompanied by her husband and they endorse overall she is doing well. No recent febrile illness or hospitalizations. She notes a short strange sound when she takes a deep breath in but no SOB or persistence of symptoms. Her complaints are largely musculoskeletal. Is following with Sports Medicine for her right shoulder pain and osteoporosis treatments and more recently she has been experiencing increased right sided neck pain. No fall or trauma. Denies CP/palp/SOB/HA/congestion/fevers/GI or GU c/o. Taking meds as prescribed  ? ?Past Medical History:  ?Diagnosis Date  ? Arthritis   ? Asthma   ? childhood now returning  ? Back pain affecting pregnancy 11/02/2014  ? Benign essential tremor 10/28/2014  ? Bilateral dry eyes   ? Breast cancer ([place])   ? right  ? [facility])   ? Breast  ? Dry eyes   ? Encounter for Medicare annual wellness exam 05/22/2016  ? Essential hypertension, benign 10/28/2014  ? H/O measles   ? H/O mumps   ? History of chicken pox   ? Lumbago 11/02/2014  ? Mixed hyperlipidemia 10/28/2014  ? Obesity 10/28/2014  ? Stenosis of cervical spine   ? ? ?Past Surgical History:  ?Procedure Laterality Date  ? BACK SURGERY    ? BREAST REDUCTION SURGERY Left   ? CHOLECYSTECTOMY    ? EYE SURGERY  2008  ? b/l cataracts removed, in [facility]  ? MASTECTOMY Right   ? REDUCTION MAMMAPLASTY    ? REPLACEMENT TOTAL KNEE BILATERAL Bilateral   ? ROTATOR CUFF REPAIR Left   ? ? ?Family History  ?Problem Relation Age of Onset  ? Heart disease Mother   ? Heart attack Mother   ? Heart failure Mother   ? Hypertension Mother   ? Cancer Father   ?     colon  ? Asthma Father   ? Parkinson's disease Father   ? Cancer Maternal Grandmother   ?     uterine  ? Heart disease Maternal  Grandfather   ? Obesity Daughter   ? Appendicitis Paternal Grandmother   ? Stroke Neg Hx   ? ? ?Social History  ? ?Socioeconomic History  ? Marital status: Married  ?  Spouse name: Not on file  ? Number of children: 1  ? Years of education: Not on file  ? Highest education level: Master's degree (e.g., MA, MS, MEng, MEd, MSW, MBA)  ?Occupational History  ? Occupation: retired  ?  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 Smoking status: Never  ? Smokeless tobacco: Never  ?Vaping Use  ? Vaping Use: Never used  ?Substance and Sexual Activity  ? Alcohol use: Yes  ?  Alcohol/week: 0.0 standard drinks  ?  Comment: two times a month  ? Drug use: No  ? Sexual activity: Not on file  ?  Comment: lives with husband, moved [place], no dietary restrictions, retired Education officer, museum  ?Other Topics Concern  ? Not on file  ?Social History Narrative  ? Pt lives with Spouse at home 1 daughter  ? Drinks coffee, and soda  ? Right handed  ? ?Social Determinants of Health  ? ?Financial Resource Strain: Not on  file  ?Food Insecurity: Not on file  ?Transportation Needs: Not on file  ?Physical Activity: Not on file  ?Stress: Not on file  ?Social Connections: Not on file  ?Intimate Partner Violence: Not on file  ? ? ?Outpatient Medications Prior to Visit  ?Medication Sig Dispense Refill  ? albuterol (PROVENTIL HFA;VENTOLIN HFA) 108 (90 Base) MCG/ACT inhaler INHALE 2 PUFFS INTO THE LUNGS EVERY 4 (FOUR) HOURS AS NEEDED FOR WHEEZING OR SHORTNESS OF BREATH. 6.7 Inhaler 1  ? allopurinol (ZYLOPRIM) 100 MG tablet TAKE 2 TABLETS BY MOUTH EVERY DAY 180 tablet 1  ? AMBULATORY NON FORMULARY MEDICATION Rollator ? ?Dx: Gait instability 1 Device 0  ? amLODipine (NORVASC) 5 MG tablet Take 1 tablet (5 mg total) by mouth daily. 90 tablet 3  ? Ascorbic Acid (VITAMIN C PO) Take 1 tablet by mouth daily.     ? aspirin 81 MG tablet Take 81 mg by mouth daily.    ? colchicine 0.6 MG tablet Take 2 tabs at first and then 1 tab q 2 hours til pain relief or max of 6  tabs in 24 hours 6 tablet 1  ? doxycycline (VIBRA-TABS) 100 MG tablet Take 1 tablet (100 mg total) by mouth 2 (two) times daily. 20 tablet 0  ? DYMISTA 137-50 MCG/ACT SUSP 1-2 SPRAYS PER NOSTRIL TWICE DAILY FOR RUNNY NOSE 69 g 1  ? ezetimibe (ZETIA) 10 MG tablet TAKE 1 TABLET BY MOUTH EVERY DAY 90 tablet 1  ? fluticasone (FLOVENT HFA) 110 MCG/ACT inhaler 2 PUFFS TWICE DAILY TO PREVENT COUGHING OR WHEEZING. MAX INS WILL COVER IS A 30 DAY 36 each 1  ? furosemide (LASIX) 40 MG tablet TAKE 1 TABLET BY MOUTH EVERY DAY 90 tablet 3  ? KLOR-CON M20 20 MEQ tablet TAKE 1 TABLET BY MOUTH DAILY. KEEP UPCOMING APPOINTMENT 90 tablet 3  ? levothyroxine (SYNTHROID) 112 MCG tablet TAKE 1 TABLET BY MOUTH EVERY DAY BEFORE BREAKFAST 90 tablet 1  ? Multiple Vitamins-Minerals (ICAPS AREDS 2) CAPS Take 1 capsule by mouth 2 (two) times daily.    ? PREVACID 30 MG capsule TAKE 1 CAPSULE BY MOUTH TWICE A DAY --MAX LIMIT 180 capsule 1  ? promethazine-dextromethorphan (PROMETHAZINE-DM) 6.25-15 MG/5ML syrup Take 2.5-5 mLs by mouth 3 (three) times daily as needed for cough. 160 mL 0  ? propranolol (INDERAL) 40 MG tablet TAKE 1 TABLET BY MOUTH TWICE A DAY 180 tablet 1  ? Red Yeast Rice Extract (RED YEAST RICE PO) Take by mouth daily.    ? Vitamin D, Ergocalciferol, (DRISDOL) 1.25 MG (50000 UNIT) CAPS capsule Take 1 capsule (50,000 Units total) by mouth every 7 (seven) days. Take for 8 total doses(weeks) 8 capsule 0  ? ?Facility-Administered Medications Prior to Visit  ?Medication Dose Route Frequency Provider Last Rate Last Admin  ? bupivacaine (MARCAINE) 0.5 % (with pres) injection 3 mL  3 mL Other Once Magnus Sinning, MD      ? ? ?Allergies  ?Allergen Reactions  ? Amoxicillin Rash  ? Ampicillin Rash  ? Penicillins Rash  ? Statins Other (See Comments)  ?  Per reports muscle aches and joint pain ?Per reports muscle aches and joint pain  ? ? ?Review of Systems  ?Constitutional:  Positive for malaise/fatigue. Negative for fever.  ?HENT:  Negative  for congestion.   ?Eyes:  Negative for blurred vision.  ?Respiratory:  Negative for shortness of breath.   ?Cardiovascular:  Negative for chest pain, palpitations and leg swelling.  ?Gastrointestinal:  Negative for abdominal  pain, blood in stool and nausea.  ?Genitourinary:  Negative for dysuria and frequency.  ?Musculoskeletal:  Positive for back pain, joint pain, myalgias and neck pain. Negative for falls.  ?Skin:  Negative for rash.  ?Neurological:  Negative for dizziness, loss of consciousness and headaches.  ?Endo/Heme/Allergies:  Negative for environmental allergies.  ?Psychiatric/Behavioral:  Negative for depression. The patient is not nervous/anxious.   ? ?   ?Objective:  ?  ?Physical Exam ?Constitutional:   ?   General: She is not in acute distress. ?   Appearance: She is well-developed.  ?HENT:  ?   Head: Normocephalic and atraumatic.  ?Eyes:  ?   Conjunctiva/sclera: Conjunctivae normal.  ?Neck:  ?   Thyroid: No thyromegaly.  ?Cardiovascular:  ?   Rate and Rhythm: Normal rate and regular rhythm.  ?   Heart sounds: Normal heart sounds. No murmur heard. ?Pulmonary:  ?   Effort: Pulmonary effort is normal. No respiratory distress.  ?   Breath sounds: Normal breath sounds.  ?Abdominal:  ?   General: Bowel sounds are normal. There is no distension.  ?   Palpations: Abdomen is soft. There is no mass.  ?   Tenderness: There is no abdominal tenderness.  ?Musculoskeletal:  ?   Cervical back: Neck supple.  ?Lymphadenopathy:  ?   Cervical: No cervical adenopathy.  ?Skin: ?   General: Skin is warm and dry.  ?Neurological:  ?   Mental Status: She is alert and oriented to person, place, and time.  ?Psychiatric:     ?   Behavior: Behavior normal.  ? ? ?BP 128/76 (BP Location: Left Arm, Patient Position: Sitting, Cuff Size: Normal)   Pulse (!) 57   Resp 20   Ht '5\' 1"'$  (1.549 m)   Wt 237 lb (107.5 kg)   SpO2 95%   BMI 44.78 kg/m?  ?Wt Readings from Last 3 Encounters:  ?05/25/21 237 lb (107.5 kg)  ?04/12/21 228 lb 12.8  oz (103.8 kg)  ?03/17/21 233 lb (105.7 kg)  ? ? ?Diabetic Foot Exam - Simple   ?No data filed ?  ? ?Lab Results  ?Component Value Date  ? WBC 9.8 02/16/2021  ? HGB 13.5 02/16/2021  ? HCT 41.3 02/16/2021  ? PLT 371.

## 2021-05-25 ENCOUNTER — Encounter: Payer: Self-pay | Admitting: Family Medicine

## 2021-05-25 ENCOUNTER — Ambulatory Visit (INDEPENDENT_AMBULATORY_CARE_PROVIDER_SITE_OTHER): Payer: Medicare Other | Admitting: Family Medicine

## 2021-05-25 ENCOUNTER — Ambulatory Visit (HOSPITAL_BASED_OUTPATIENT_CLINIC_OR_DEPARTMENT_OTHER)
Admission: RE | Admit: 2021-05-25 | Discharge: 2021-05-25 | Disposition: A | Discharge status: Home / Self Care | Payer: Medicare Other | Source: Ambulatory Visit | Attending: Family Medicine | Admitting: Family Medicine

## 2021-05-25 VITALS — BP 128/76 | HR 57 | Resp 20 | Ht 61.0 in | Wt 237.0 lb

## 2021-05-25 DIAGNOSIS — M810 Age-related osteoporosis without current pathological fracture: Secondary | ICD-10-CM | POA: Diagnosis not present

## 2021-05-25 DIAGNOSIS — I10 Essential (primary) hypertension: Secondary | ICD-10-CM | POA: Diagnosis not present

## 2021-05-25 DIAGNOSIS — E039 Hypothyroidism, unspecified: Secondary | ICD-10-CM | POA: Diagnosis not present

## 2021-05-25 DIAGNOSIS — Z23 Encounter for immunization: Secondary | ICD-10-CM | POA: Diagnosis not present

## 2021-05-25 DIAGNOSIS — E782 Mixed hyperlipidemia: Secondary | ICD-10-CM

## 2021-05-25 DIAGNOSIS — E559 Vitamin D deficiency, unspecified: Secondary | ICD-10-CM | POA: Diagnosis not present

## 2021-05-25 DIAGNOSIS — M19011 Primary osteoarthritis, right shoulder: Secondary | ICD-10-CM | POA: Diagnosis not present

## 2021-05-25 DIAGNOSIS — M8580 Other specified disorders of bone density and structure, unspecified site: Secondary | ICD-10-CM | POA: Insufficient documentation

## 2021-05-25 DIAGNOSIS — R739 Hyperglycemia, unspecified: Secondary | ICD-10-CM | POA: Diagnosis not present

## 2021-05-25 DIAGNOSIS — M542 Cervicalgia: Secondary | ICD-10-CM | POA: Insufficient documentation

## 2021-05-25 LAB — CBC
HCT: 41.7 % (ref 36.0–46.0)
Hemoglobin: 13.2 g/dL (ref 12.0–15.0)
MCHC: 31.7 g/dL (ref 30.0–36.0)
MCV: 99 fl (ref 78.0–100.0)
Platelets: 316 10*3/uL (ref 150.0–400.0)
RBC: 4.21 Mil/uL (ref 3.87–5.11)
RDW: 16.2 % — ABNORMAL HIGH (ref 11.5–15.5)
WBC: 8.2 10*3/uL (ref 4.0–10.5)

## 2021-05-25 LAB — COMPREHENSIVE METABOLIC PANEL
ALT: 12 U/L (ref 0–35)
AST: 19 U/L (ref 0–37)
Albumin: 3.9 g/dL (ref 3.5–5.2)
Alkaline Phosphatase: 76 U/L (ref 39–117)
BUN: 11 mg/dL (ref 6–23)
CO2: 29 mEq/L (ref 19–32)
Calcium: 9 mg/dL (ref 8.4–10.5)
Chloride: 104 mEq/L (ref 96–112)
Creatinine, Ser: 0.76 mg/dL (ref 0.40–1.20)
GFR: 73.47 mL/min (ref >60.00)
Glucose, Bld: 92 mg/dL (ref 70–99)
Potassium: 4.2 mEq/L (ref 3.5–5.1)
Sodium: 144 mEq/L (ref 135–145)
Total Bilirubin: 0.4 mg/dL (ref 0.2–1.2)
Total Protein: 6.4 g/dL (ref 6.0–8.3)

## 2021-05-25 LAB — VITAMIN D 25 HYDROXY (VIT D DEFICIENCY, FRACTURES): VITD: 23.48 ng/mL — ABNORMAL LOW (ref 30.00–100.00)

## 2021-05-25 LAB — LIPID PANEL
Cholesterol: 204 mg/dL — ABNORMAL HIGH (ref 0–200)
HDL: 52.2 mg/dL (ref >39.00)
LDL Cholesterol: 118 mg/dL — ABNORMAL HIGH (ref 0–99)
NonHDL: 152.21
Total CHOL/HDL Ratio: 4
Triglycerides: 170 mg/dL — ABNORMAL HIGH (ref 0.0–149.0)
VLDL: 34 mg/dL (ref 0.0–40.0)

## 2021-05-25 LAB — HEMOGLOBIN A1C: Hgb A1c MFr Bld: 5.8 % (ref 4.6–6.5)

## 2021-05-25 LAB — TSH: TSH: 5.35 u[IU]/mL (ref 0.35–5.50)

## 2021-05-25 NOTE — Assessment & Plan Note (Signed)
Encouraged moist heat and gentle stretching as tolerated. May try NSAIDs and prescription meds as directed and report if symptoms worsen or seek immediate care, working with sports medicine ?

## 2021-05-25 NOTE — Assessment & Plan Note (Signed)
Encouraged DASH or MIND diet, decrease po intake and increase exercise as tolerated. Needs 7-8 hours of sleep nightly. Avoid trans fats, eat small, frequent meals every 4-5 hours with lean proteins, complex carbs and healthy fats. Minimize simple carbs, high fat foods and processed foods 

## 2021-05-25 NOTE — Assessment & Plan Note (Signed)
She had misunderstood and stopped her vitamin D supplementation. She will restart and we will recheck ?

## 2021-05-25 NOTE — Assessment & Plan Note (Signed)
On Levothyroxine, continue to monitor 

## 2021-05-25 NOTE — Assessment & Plan Note (Signed)
Encouraged moist heat and gentle stretching as tolerated. May try NSAIDs and prescription meds as directed and report if symptoms worsen or seek immediate care, obtain xray of neck today as this pain is newer.  ?

## 2021-05-25 NOTE — Patient Instructions (Signed)
Acute Back Pain, Adult Acute back pain is sudden and usually short-lived. It is often caused by an injury to the muscles and tissues in the back. The injury may result from: A muscle, tendon, or ligament getting overstretched or torn. Ligaments are tissues that connect bones to each other. Lifting something improperly can cause a back strain. Wear and tear (degeneration) of the spinal disks. Spinal disks are circular tissue that provide cushioning between the bones of the spine (vertebrae). Twisting motions, such as while playing sports or doing yard work. A hit to the back. Arthritis. You may have a physical exam, lab tests, and imaging tests to find the cause of your pain. Acute back pain usually goes away with rest and home care. Follow these instructions at home: Managing pain, stiffness, and swelling Take over-the-counter and prescription medicines only as told by your health care provider. Treatment may include medicines for pain and inflammation that are taken by mouth or applied to the skin, or muscle relaxants. Your health care provider may recommend applying ice during the first 24-48 hours after your pain starts. To do this: Put ice in a plastic bag. Place a towel between your skin and the bag. Leave the ice on for 20 minutes, 2-3 times a day. Remove the ice if your skin turns bright red. This is very important. If you cannot feel pain, heat, or cold, you have a greater risk of damage to the area. If directed, apply heat to the affected area as often as told by your health care provider. Use the heat source that your health care provider recommends, such as a moist heat pack or a heating pad. Place a towel between your skin and the heat source. Leave the heat on for 20-30 minutes. Remove the heat if your skin turns bright red. This is especially important if you are unable to feel pain, heat, or cold. You have a greater risk of getting burned. Activity  Do not stay in bed. Staying in  bed for more than 1-2 days can delay your recovery. Sit up and stand up straight. Avoid leaning forward when you sit or hunching over when you stand. If you work at a desk, sit close to it so you do not need to lean over. Keep your chin tucked in. Keep your neck drawn back, and keep your elbows bent at a 90-degree angle (right angle). Sit high and close to the steering wheel when you drive. Add lower back (lumbar) support to your car seat, if needed. Take short walks on even surfaces as soon as you are able. Try to increase the length of time you walk each day. Do not sit, drive, or stand in one place for more than 30 minutes at a time. Sitting or standing for long periods of time can put stress on your back. Do not drive or use heavy machinery while taking prescription pain medicine. Use proper lifting techniques. When you bend and lift, use positions that put less stress on your back: Bend your knees. Keep the load close to your body. Avoid twisting. Exercise regularly as told by your health care provider. Exercising helps your back heal faster and helps prevent back injuries by keeping muscles strong and flexible. Work with a physical therapist to make a safe exercise program, as recommended by your health care provider. Do any exercises as told by your physical therapist. Lifestyle Maintain a healthy weight. Extra weight puts stress on your back and makes it difficult to have good   posture. Avoid activities or situations that make you feel anxious or stressed. Stress and anxiety increase muscle tension and can make back pain worse. Learn ways to manage anxiety and stress, such as through exercise. General instructions Sleep on a firm mattress in a comfortable position. Try lying on your side with your knees slightly bent. If you lie on your back, put a pillow under your knees. Keep your head and neck in a straight line with your spine (neutral position) when using electronic equipment like  smartphones or pads. To do this: Raise your smartphone or pad to look at it instead of bending your head or neck to look down. Put the smartphone or pad at the level of your face while looking at the screen. Follow your treatment plan as told by your health care provider. This may include: Cognitive or behavioral therapy. Acupuncture or massage therapy. Meditation or yoga. Contact a health care provider if: You have pain that is not relieved with rest or medicine. You have increasing pain going down into your legs or buttocks. Your pain does not improve after 2 weeks. You have pain at night. You lose weight without trying. You have a fever or chills. You develop nausea or vomiting. You develop abdominal pain. Get help right away if: You develop new bowel or bladder control problems. You have unusual weakness or numbness in your arms or legs. You feel faint. These symptoms may represent a serious problem that is an emergency. Do not wait to see if the symptoms will go away. Get medical help right away. Call your local emergency services (911 in the [place] Do not drive yourself to the hospital. Summary Acute back pain is sudden and usually short-lived. Use proper lifting techniques. When you bend and lift, use positions that put less stress on your back. Take over-the-counter and prescription medicines only as told by your health care provider, and apply heat or ice as told. This information is not intended to replace advice given to you by your health care provider. Make sure you discuss any questions you have with your health care provider. Document Revised: 04/17/2020 Document Reviewed: 04/17/2020 Elsevier Patient Education  2023 [employer]  

## 2021-05-25 NOTE — Assessment & Plan Note (Addendum)
Encouraged to get adequate exercise, calcium and vitamin d intake, being treated with Prolia by Sports medicine ?

## 2021-05-25 NOTE — Assessment & Plan Note (Signed)
Encourage heart healthy diet such as MIND or DASH diet, increase exercise, avoid trans fats, simple carbohydrates and processed foods, consider a krill or fish or flaxseed oil cap daily.  °

## 2021-05-25 NOTE — Assessment & Plan Note (Signed)
hgba1c acceptable, minimize simple carbs. Increase exercise as tolerated.  

## 2021-05-26 ENCOUNTER — Other Ambulatory Visit: Payer: Self-pay

## 2021-05-26 MED ORDER — VITAMIN D (ERGOCALCIFEROL) 1.25 MG (50000 UNIT) PO CAPS
50000.0000 [IU] | ORAL_CAPSULE | ORAL | 4 refills | Status: DC
Start: 2021-05-26 — End: 2021-12-06

## 2021-05-27 DIAGNOSIS — Z20822 Contact with and (suspected) exposure to covid-19: Secondary | ICD-10-CM | POA: Diagnosis not present

## 2021-06-01 NOTE — Telephone Encounter (Signed)
Last Prolia inj 01/20/21 (OV with Dr. Raeford Razor) ?Next Prolia inj due 07/22/21 ?

## 2021-06-15 ENCOUNTER — Ambulatory Visit (INDEPENDENT_AMBULATORY_CARE_PROVIDER_SITE_OTHER): Payer: Medicare Other | Admitting: Physical Medicine and Rehabilitation

## 2021-06-15 ENCOUNTER — Ambulatory Visit: Payer: Self-pay

## 2021-06-15 VITALS — BP 147/82 | HR 52

## 2021-06-15 DIAGNOSIS — M5416 Radiculopathy, lumbar region: Secondary | ICD-10-CM | POA: Diagnosis not present

## 2021-06-15 MED ORDER — METHYLPREDNISOLONE ACETATE 80 MG/ML IJ SUSP
80.0000 mg | Freq: Once | INTRAMUSCULAR | Status: AC
  Administered 2021-06-15: 80 mg

## 2021-06-15 NOTE — Patient Instructions (Signed)

## 2021-06-15 NOTE — Progress Notes (Signed)
Low back pain. Denies radiculopathy. States legs are very weak and has trouble walking. States pain is all across her lower back. Ambulates with cane.  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

## 2021-06-22 NOTE — Telephone Encounter (Signed)
Prolia VOB initiated via [email or web address] ? ?Last OV: 03/17/21 ?Next OV:  ?Last Prolia inj: 01/20/21 ?Next Prolia inj DUE: 07/22/21 ? ?

## 2021-06-23 ENCOUNTER — Encounter: Payer: Self-pay | Admitting: Family Medicine

## 2021-06-23 DIAGNOSIS — M542 Cervicalgia: Secondary | ICD-10-CM

## 2021-06-23 DIAGNOSIS — M25511 Pain in right shoulder: Secondary | ICD-10-CM

## 2021-06-30 NOTE — Telephone Encounter (Signed)
Pt informed of below.  She will check her calender and call us back to schedule nurse visit for Prolia.

## 2021-06-30 NOTE — Telephone Encounter (Signed)
Pt ready for scheduling on or after 07/22/21   Out-of-pocket cost due at time of visit: $60   Primary: Medicare Prolia co-insurance: 20% (approximately $276) Admin fee co-insurance: 20% (approximately $25)   Secondary: [place]: 20% (approx $55) Admin fee co-insurance: 20% (approx $5)   Deductible: deductible has been met   Prior Auth: APPROVED PA# [id number], case [id number] Valid 04/02/21-04/01/22   ** This summary of benefits is an estimation of the patient's out-of-pocket cost. Exact cost may vary based on individual plan coverage.

## 2021-07-04 NOTE — Procedures (Signed)
Lumbar Epidural Steroid Injection - Interlaminar Approach with Fluoroscopic Guidance  Patient: Amanda Copeland      Date of Birth: Sep 04, 1940 MRN: [id number] PCP: [name], MD      Visit Date: 06/15/2021   Universal Protocol:     Consent Given By: the patient  Position: PRONE  Additional Comments: Vital signs were monitored before and after the procedure. Patient was prepped and draped in the usual sterile fashion. The correct patient, procedure, and site was verified.   Injection Procedure Details:   Procedure diagnoses: Lumbar radiculopathy [M54.16]   Meds Administered:  Meds ordered this encounter  Medications   methylPREDNISolone acetate (DEPO-MEDROL) injection 80 mg     Laterality: Left  Location/Site:  L5-S1  Needle: 3.5 in., 20 ga. Tuohy  Needle Placement: Paramedian epidural  Findings:   -Comments: Excellent flow of contrast into the epidural space.  Procedure Details: Using a paramedian approach from the side mentioned above, the region overlying the inferior lamina was localized under fluoroscopic visualization and the soft tissues overlying this structure were infiltrated with 4 ml. of 1% Lidocaine without Epinephrine. The Tuohy needle was inserted into the epidural space using a paramedian approach.   The epidural space was localized using loss of resistance along with counter oblique bi-planar fluoroscopic views.  After negative aspirate for air, blood, and CSF, a 2 ml. volume of Isovue-250 was injected into the epidural space and the flow of contrast was observed. Radiographs were obtained for documentation purposes.    The injectate was administered into the level noted above.   Additional Comments:   Dressing: 2 x 2 sterile gauze and Band-Aid    Post-procedure details: Patient was observed during the procedure. Post-procedure instructions were reviewed.  Patient left the clinic in stable condition.

## 2021-07-04 NOTE — Progress Notes (Signed)
Amanda Copeland - 81 y.o. female MRN [id number]  Date of birth: [birth year]  Office Visit Note: Visit Date: 06/15/2021 PCP: [name], MD Referred by: [name], MD  Subjective: No chief complaint on file.  HPI:  Amanda Copeland is a 81 y.o. female who comes in today at the request of Barnet Pall, FNP for planned Left L5-S1 Lumbar Interlaminar epidural steroid injection with fluoroscopic guidance.  The patient has failed conservative care including home exercise, medications, time and activity modification.  This injection will be diagnostic and hopefully therapeutic.  Please see requesting physician notes for further details and justification.  ROS Otherwise per HPI.  Assessment & Plan: Visit Diagnoses:    ICD-10-CM   1. Lumbar radiculopathy  M54.16 XR C-ARM NO REPORT    Epidural Steroid injection    methylPREDNISolone acetate (DEPO-MEDROL) injection 80 mg      Plan: No additional findings.   Meds & Orders:  Meds ordered this encounter  Medications   methylPREDNISolone acetate (DEPO-MEDROL) injection 80 mg    Orders Placed This Encounter  Procedures   XR C-ARM NO REPORT   Epidural Steroid injection    Follow-up: Return if symptoms worsen or fail to improve.   Procedures: No procedures performed  Lumbar Epidural Steroid Injection - Interlaminar Approach with Fluoroscopic Guidance  Patient: Amanda Copeland      Date of Birth: 14-Aug-1940 MRN: [id number] PCP: [name], MD      Visit Date: 06/15/2021   Universal Protocol:     Consent Given By: the patient  Position: PRONE  Additional Comments: Vital signs were monitored before and after the procedure. Patient was prepped and draped in the usual sterile fashion. The correct patient, procedure, and site was verified.   Injection Procedure Details:   Procedure diagnoses: Lumbar radiculopathy [M54.16]   Meds Administered:  Meds ordered this encounter  Medications   methylPREDNISolone acetate  (DEPO-MEDROL) injection 80 mg     Laterality: Left  Location/Site:  L5-S1  Needle: 3.5 in., 20 ga. Tuohy  Needle Placement: Paramedian epidural  Findings:   -Comments: Excellent flow of contrast into the epidural space.  Procedure Details: Using a paramedian approach from the side mentioned above, the region overlying the inferior lamina was localized under fluoroscopic visualization and the soft tissues overlying this structure were infiltrated with 4 ml. of 1% Lidocaine without Epinephrine. The Tuohy needle was inserted into the epidural space using a paramedian approach.   The epidural space was localized using loss of resistance along with counter oblique bi-planar fluoroscopic views.  After negative aspirate for air, blood, and CSF, a 2 ml. volume of Isovue-250 was injected into the epidural space and the flow of contrast was observed. Radiographs were obtained for documentation purposes.    The injectate was administered into the level noted above.   Additional Comments:   Dressing: 2 x 2 sterile gauze and Band-Aid    Post-procedure details: Patient was observed during the procedure. Post-procedure instructions were reviewed.  Patient left the clinic in stable condition.   Clinical History: EXAM: MRI LUMBAR SPINE WITHOUT CONTRAST   TECHNIQUE: Multiplanar, multisequence MR imaging of the lumbar spine was performed. No intravenous contrast was administered.   COMPARISON:  MRI of the lumbar spine April 05, 2019.   FINDINGS: Segmentation:  Standard.   Alignment: Small anterolisthesis of L4 over L5 and L5 over S1, unchanged.   Vertebrae: No acute fracture, evidence of discitis, or bone lesion. Chronic compression fracture of the T11 superior  endplate with associated Schmorl node, unchanged. Congenitally small spinal canal.   Conus medullaris and cauda equina: Conus extends to the T12-L1 level. Conus and cauda equina appear normal.   Paraspinal and other  soft tissues: Negative.   Disc levels:   T12-L1: No spinal canal or neural foraminal stenosis.   L1-2: Shallow disc bulge and mild facet degenerative changes resulting in mild bilateral neural foraminal narrowing.   L2-3: Disc bulge with superimposed small central disc protrusion, moderate hypertrophic facet degenerative changes with bilateral joint effusion and ligamentum flavum redundancy resulting in mild spinal canal stenosis with narrowing of the bilateral subarticular zones, moderate right and moderate to severe left neural foraminal narrowing.   L3-4: Disc bulge, moderate to advanced hypertrophic facet degenerative changes and ligamentum flavum redundancy resulting in mild spinal canal stenosis with narrowing of the bilateral subarticular zones, mild right and moderate left neural foraminal narrowing. Left neural foraminal narrowing appear progressed since prior MRI.   L4-5: Anterolisthesis, disc bulge with new left foraminal disc protrusion, prominent hypertrophic facet degenerative changes and ligamentum flavum redundancy resulting in severe spinal canal stenosis, moderate right and moderate to severe left neural foraminal narrowing.   L5-S1: Prominent loss of disc height, shallow disc bulge and moderate hypertrophic facet degenerative changes without significant spinal canal or neural foraminal stenosis.   IMPRESSION: 1. Severe spinal canal stenosis at L4-5 with moderate right and moderate to severe left neural foraminal narrowing. 2. Mild spinal canal stenosis with narrowing of the bilateral subarticular zones at L3-4 with mild right and moderate left neural foraminal narrowing. 3. Mild spinal canal stenosis with narrowing of the bilateral subarticular zones at L2-3 with moderate right and moderate to severe left neural foraminal narrowing.     Electronically Signed   By: Pedro Earls M.D.   On: 05/10/2021 14:09     Objective:  VS:  HT:     WT:   BMI:     BP:(!) 147/82  HR:(!) 52bpm  TEMP: ( )  RESP:  Physical Exam Vitals and nursing note reviewed.  Constitutional:      General: She is not in acute distress.    Appearance: Normal appearance. She is not ill-appearing.  HENT:     Head: Normocephalic and atraumatic.     Right Ear: External ear normal.     Left Ear: External ear normal.  Eyes:     Extraocular Movements: Extraocular movements intact.  Cardiovascular:     Rate and Rhythm: Normal rate.     Pulses: Normal pulses.  Pulmonary:     Effort: Pulmonary effort is normal. No respiratory distress.  Abdominal:     General: There is no distension.     Palpations: Abdomen is soft.  Musculoskeletal:        General: Tenderness present.     Cervical back: Neck supple.     Right lower leg: No edema.     Left lower leg: No edema.     Comments: Patient has good distal strength with no pain over the greater trochanters.  No clonus or focal weakness.  Skin:    Findings: No erythema, lesion or rash.  Neurological:     General: No focal deficit present.     Mental Status: She is alert and oriented to person, place, and time.     Sensory: No sensory deficit.     Motor: No weakness or abnormal muscle tone.     Coordination: Coordination normal.  Psychiatric:  Mood and Affect: Mood normal.        Behavior: Behavior normal.     Imaging: No results found.

## 2021-07-07 ENCOUNTER — Other Ambulatory Visit: Payer: Self-pay

## 2021-07-07 ENCOUNTER — Ambulatory Visit (HOSPITAL_BASED_OUTPATIENT_CLINIC_OR_DEPARTMENT_OTHER)
Admission: RE | Admit: 2021-07-07 | Discharge: 2021-07-07 | Disposition: A | Discharge status: Home / Self Care | Payer: Medicare Other | Source: Ambulatory Visit | Attending: Family Medicine | Admitting: Family Medicine

## 2021-07-07 DIAGNOSIS — M542 Cervicalgia: Secondary | ICD-10-CM | POA: Insufficient documentation

## 2021-07-07 DIAGNOSIS — M4802 Spinal stenosis, cervical region: Secondary | ICD-10-CM | POA: Diagnosis not present

## 2021-07-08 ENCOUNTER — Other Ambulatory Visit: Payer: Self-pay | Admitting: Family Medicine

## 2021-07-08 DIAGNOSIS — M542 Cervicalgia: Secondary | ICD-10-CM

## 2021-07-08 DIAGNOSIS — M4802 Spinal stenosis, cervical region: Secondary | ICD-10-CM

## 2021-07-10 ENCOUNTER — Other Ambulatory Visit: Payer: Self-pay | Admitting: Neurology

## 2021-07-10 DIAGNOSIS — G250 Essential tremor: Secondary | ICD-10-CM

## 2021-07-12 ENCOUNTER — Other Ambulatory Visit: Payer: Self-pay

## 2021-07-16 ENCOUNTER — Telehealth: Payer: Self-pay | Admitting: Family Medicine

## 2021-07-16 NOTE — Telephone Encounter (Signed)
Patient needs Prolia on or after 07/22/21. Amanda Copeland To call pt to schedule nurse visit.

## 2021-07-16 NOTE — Telephone Encounter (Signed)
Forwarding note to [facility].-Pt cld states she thinks it's time to get her Vit D injection? Unsure if pt means Prolia? She says provider know what she's talking about. --see 01/20/21 note:    ASSESSMENT & PLAN:    Vitamin D deficiency Check vitamin D    SI joint arthritis Has changes appreciated on previous imaging.  Tender to palpation over the left SI joint.  Likely related to abnormal gait with recent healing of left bimalleolar fracture. -Counseled on home exercise therapy and supportive care. -SI joint injection. -Could consider imaging or physical therapy.   Compression fracture of T11 vertebra (HCC) Was appreciated on previous imaging  -Initiate Prolia and check vitamin D.   Age-related osteoporosis with current pathological fracture Initiated Prolia.  --glh

## 2021-07-21 DIAGNOSIS — L570 Actinic keratosis: Secondary | ICD-10-CM | POA: Diagnosis not present

## 2021-07-21 DIAGNOSIS — L720 Epidermal cyst: Secondary | ICD-10-CM | POA: Diagnosis not present

## 2021-07-21 DIAGNOSIS — L821 Other seborrheic keratosis: Secondary | ICD-10-CM | POA: Diagnosis not present

## 2021-07-21 DIAGNOSIS — Z86008 Personal history of in-situ neoplasm of other site: Secondary | ICD-10-CM | POA: Diagnosis not present

## 2021-07-22 ENCOUNTER — Other Ambulatory Visit: Payer: Self-pay | Admitting: Family Medicine

## 2021-07-22 DIAGNOSIS — E782 Mixed hyperlipidemia: Secondary | ICD-10-CM

## 2021-07-27 ENCOUNTER — Ambulatory Visit (INDEPENDENT_AMBULATORY_CARE_PROVIDER_SITE_OTHER): Payer: Medicare Other | Admitting: *Deleted

## 2021-07-27 ENCOUNTER — Encounter: Payer: Self-pay | Admitting: *Deleted

## 2021-07-27 DIAGNOSIS — M8000XD Age-related osteoporosis with current pathological fracture, unspecified site, subsequent encounter for fracture with routine healing: Secondary | ICD-10-CM

## 2021-07-27 MED ORDER — DENOSUMAB 60 MG/ML ~~LOC~~ SOSY
60.0000 mg | PREFILLED_SYRINGE | Freq: Once | SUBCUTANEOUS | Status: AC
  Administered 2021-07-27: 60 mg via SUBCUTANEOUS

## 2021-07-27 NOTE — Progress Notes (Signed)
Patient is here for nurse visit for Prolia 60 mg. She received Plaquemines injection in her left arm. She will return in 6 months for her next Prolia injection.     

## 2021-07-29 DIAGNOSIS — M5412 Radiculopathy, cervical region: Secondary | ICD-10-CM | POA: Diagnosis not present

## 2021-07-29 DIAGNOSIS — M542 Cervicalgia: Secondary | ICD-10-CM | POA: Diagnosis not present

## 2021-08-05 DIAGNOSIS — M5412 Radiculopathy, cervical region: Secondary | ICD-10-CM | POA: Diagnosis not present

## 2021-08-06 NOTE — Therapy (Signed)
OUTPATIENT PHYSICAL THERAPY CERVICAL EVALUATION   Patient Name: Amanda Copeland MRN: [id number] DOB:11-29-1940, 81 y.o., female Today's Date: 08/17/2021   PT End of Session - 08/17/21 1534     Visit Number 1    Date for PT Re-Evaluation 10/12/21    Authorization Type Medicare & BCBS    PT Start Time 3154    PT Stop Time 1628    PT Time Calculation (min) 54 min    Activity Tolerance Patient tolerated treatment well    Behavior During Therapy WFL for tasks assessed/performed             Past Medical History:  Diagnosis Date   Arthritis    Asthma    childhood now returning   Back pain affecting pregnancy 11/02/2014   Benign essential tremor 10/28/2014   Bilateral dry eyes    Breast cancer ([place])    right   Cancer ([place])    Breast   Dry eyes    Encounter for Medicare annual wellness exam 05/22/2016   Essential hypertension, benign 10/28/2014   H/O measles    H/O mumps    History of chicken pox    Lumbago 11/02/2014   Mixed hyperlipidemia 10/28/2014   Obesity 10/28/2014   Stenosis of cervical spine    Past Surgical History:  Procedure Laterality Date   BACK SURGERY     BREAST REDUCTION SURGERY Left    CHOLECYSTECTOMY     EYE SURGERY  2008   b/l cataracts removed, in [place] Bilateral    ROTATOR CUFF REPAIR Left    Patient Active Problem List   Diagnosis Date Noted   Osteoporosis 05/25/2021   Scapular dysfunction 03/17/2021   Compression fracture of T11 vertebra ([place]) 01/20/2021   SI joint arthritis 01/20/2021   Vitamin D deficiency 01/20/2021   Age-related osteoporosis with current pathological fracture 11/26/2020   Pain of right upper extremity 11/11/2020   Bradycardia 11/11/2020   Neck pain 03/11/2020   Tremor 03/01/2020   Hypothyroid 03/01/2020   Physical deconditioning 11/25/2019   At risk for obstructive sleep apnea 11/25/2019   Primary osteoarthritis of right shoulder  02/28/2019   Other forms of dyspnea 11/22/2018   Educated about COVID-19 virus infection 07/03/2018   Greater trochanteric pain syndrome of right lower extremity 03/18/2018   Chronic nonallergic rhinitis 12/20/2017   Cough 12/20/2017   Right flank pain 12/10/2017   Fatigue 07/12/2017   Morbid obesity ([place]) 03/05/2017   Chronic diastolic heart failure ([place]) 11/29/2016   Dyspnea on exertion 10/06/2016   Asthma 10/06/2016   Encounter for Medicare annual wellness exam 05/22/2016   Hyperglycemia 08/07/2015   Allergic 08/07/2015   Gout 08/07/2015   Leukocytosis 08/07/2015   Lower abdominal pain 04/21/2015   Low back pain 11/02/2014   Esophageal reflux 10/28/2014   Essential hypertension, benign 10/28/2014   Mixed hyperlipidemia 10/28/2014   Benign essential tremor 10/28/2014   Bilateral dry eyes    Moderate persistent asthma    Arthritis    Cancer ([place])    History of chicken pox     PCP:  [name], MD  REFERRING PROVIDER:  Kary Kos, MD  REFERRING DIAG:  M54.12 (ICD-10-CM) - Radiculopathy, cervical region  THERAPY DIAG:  Radiculopathy, cervical region  Cervicalgia  Abnormal posture  Muscle weakness (generalized)  Other muscle spasm  Other symptoms and signs involving the musculoskeletal system  RATIONALE FOR EVALUATION AND  TREATMENT:  Rehabilitation  ONSET DATE:  3-4 months  SUBJECTIVE:                                                                                                                                                                                                         SUBJECTIVE STATEMENT: Pt reports a lot pain in her neck when she turns her head, with pain starting behind her ear and extending over the top of her shoulder and into her R upper arm. She received an ESI ~2 weeks ago with relief for ~4 days then pain returned. Feels like a tight guitar string being plucked. Sometimes while walking, her R arm feels like it wants to drop  off.  PAIN:  Are you having pain? Yes: NPRS scale: 8/10 Pain location: R  side of neck extending across upper shoulder and down anterior upper arm Pain description: sharp Aggravating factors: turning her head, moving her R arm Relieving factors: ice, Aleve   PERTINENT HISTORY:  Chronic LBP with h/o surgery for "ruptured disc", lumbar spinal stenosis, OA, B TKA, L RTC repair, chronic diastolic HF, HLD, moderate asthma, DOE, HTN, breast cancer, benign essential tremor, morbid obesity   PRECAUTIONS: Fall  WEIGHT BEARING RESTRICTIONS No  FALLS:  Has patient fallen in last 6 months? Yes. Number of falls 1  LIVING ENVIRONMENT: Lives with: lives with their spouse Lives in: House/apartment Stairs: Yes: External: 1 steps; over threshold Has following equipment at home: Single point cane, Environmental consultant - 2 wheeled, Environmental consultant - 4 wheeled, Wheelchair (manual), shower chair, and bed side commode  OCCUPATION: Retired  PLOF: Independent and Leisure: going on cruises, movies, playing cards with friends, has not been swimming like she used to  PATIENT GOALS: "To be able to move my neck w/o pain."  OBJECTIVE:   DIAGNOSTIC FINDINGS:   Cervical MRI - 07/07/21: 1. Degenerative changes of the cervical spine with moderate spinal canal stenosis at C5-6 and C6-7 with mild mass effect on the cord at these levels without cord signal abnormality.  2. Multilevel high-grade neural foraminal narrowing, as described above, severe bilaterally at C5-6.  PATIENT SURVEYS:  FOTO Neck = 57; predicted = 65  COGNITION: Overall cognitive status: Within functional limits for tasks assessed  SENSATION: WFL  POSTURE:  rounded shoulders, forward head, and increased thoracic kyphosis  PALPATION: Increased muscle tensio bilaterally with TTP R>L in B cervical paraspinals, UT, LS teres group, lats, deltoids and pecs   CERVICAL ROM:   Active ROM AROM (deg) Eval 08/17/21  Flexion 47  Extension 38  Right lateral  flexion  12  Left lateral flexion 19  Right rotation 52  Left rotation 47   (Blank rows = not tested)  UPPER EXTREMITY ROM:  Active ROM Right Eval 08/17/21 Left Eval 08/17/21  Shoulder flexion 91 109  Shoulder extension 27 37  Shoulder abduction 89 88  Shoulder adduction    Shoulder internal rotation    Shoulder external rotation 30 48  Elbow flexion    Elbow extension    Wrist flexion    Wrist extension    Wrist ulnar deviation    Wrist radial deviation    Wrist pronation    Wrist supination     (Blank rows = not tested)  UPPER EXTREMITY MMT: (for avaialble ROM)  MMT Right Eval 08/17/21 Left Eval 08/17/21  Shoulder flexion 3+ 4-  Shoulder extension 4- 4  Shoulder abduction 3+ 4-  Shoulder adduction    Shoulder internal rotation 4 4  Shoulder external rotation 3+ 3+  Middle trapezius    Lower trapezius    Elbow flexion    Elbow extension    Wrist flexion    Wrist extension    Wrist ulnar deviation    Wrist radial deviation    Wrist pronation    Wrist supination    Grip strength     (Blank rows = not tested)  CERVICAL SPECIAL TESTS:  Spurling's test: Positive and Distraction test: Positive   TODAY'S TREATMENT:   08/17/21 THERAPEUTIC EXERCISE: Instruction in initial HEP (see below) to improve flexibility, strength and mobility.  Verbal and tactile cues throughout for technique.   PATIENT EDUCATION:  Education details: PT eval findings, anticipated POC, initial HEP, and postural awareness Person educated: Patient Education method: Explanation Education comprehension: verbalized understanding, returned demonstration, verbal cues required, and needs further education   HOME EXERCISE PROGRAM: Access Code: [email or web address] URL: [email or web address] Date: 08/17/2021 Prepared by: Annie Paras  Exercises - Seated Passive Cervical Retraction  - 2-3 x daily - 7 x weekly - 2 sets - 10 reps - 3-5 sec hold - Seated Scapular Retraction  - 2-3 x daily - 7 x  weekly - 2 sets - 10 reps - 5 sec hold - Doorway Pec Stretch at 60 Degrees Abduction with Arm Straight  - 2-3 x daily - 7 x weekly - 3 reps - 30 sec hold  ASSESSMENT:  CLINICAL IMPRESSION: Amanda Copeland is an 81 y.o. female who was seen today for physical therapy evaluation and treatment for cervical radiculopathy at C6. She reports gradual onset of R-sided neck pain ~3-4 months ago w/o known trigger. Pain now extends from R side of neck across upper shoulder and into R anterior upper arm, worse with B neck rotation. Imaging reveals significant degenerative changes of the cervical spine with moderate spinal canal stenosis at C5-6 and C6-7 and multilevel high-grade neural foraminal narrowing, most severe bilaterally at C5-6. She had very brief relief from [facility] for ~4 days. Deficits include abnormal posture, limited and painful cervical AROM, limited B shoulder ROM in all planes, cervical hypomobility with increased muscle tension t/o cervical paraspinals, R>L UT, LS, scalenes, pecs and posterior shoulder, and R UE radiculopathy. Increased pain noted with manual cervical distraction. Instruction provided in initial HEP to address posture and muscle tension/tightness. Amanda Copeland will benefit from skilled PT to address above deficits to restore pain free functional cervical ROM and alleviate R UE radiculopathy to improve tolerance for normal daily activities. POC will be established for 8 weeks but pt will be traveling on a cruise/vacation from  Aug 4-19.  OBJECTIVE IMPAIRMENTS decreased activity tolerance, decreased knowledge of condition, decreased mobility, difficulty walking, decreased ROM, decreased strength, increased fascial restrictions, increased muscle spasms, impaired flexibility, impaired UE functional use, improper body mechanics, postural dysfunction, and pain.   ACTIVITY LIMITATIONS carrying, lifting, reach over head, and turning head  PARTICIPATION LIMITATIONS: meal prep, cleaning, laundry, and  community activity  PERSONAL FACTORS Age, Fitness, Past/current experiences, Time since onset of injury/illness/exacerbation, Transportation, and 3+ comorbidities: Chronic LBP with h/o surgery for "ruptured disc", lumbar spinal stenosis, OA, B TKA, L RTC repair, chronic diastolic HF, HLD, moderate asthma, DOE, HTN, breast cancer, benign essential tremor, morbid obesity   are also affecting patient's functional outcome.   REHAB POTENTIAL: Good  CLINICAL DECISION MAKING: Evolving/moderate complexity  EVALUATION COMPLEXITY: Moderate   GOALS: Goals reviewed with patient? Yes  SHORT TERM GOALS: Target date: 09/07/2021   Patient will be independent with initial HEP.  Baseline:  Goal status: INITIAL  2.  Patient to report reduction in frequency and intensity of R-sided neck and radicular UE pain by >/= 25%  Baseline:  Goal status: INITIAL  LONG TERM GOALS: Target date: 10/12/2021   Patient will be independent with advanced/ongoing HEP to improve outcomes and carryover.  Baseline:  Goal status: INITIAL  2.  Patient will report 50-75% improvement in neck pain to improve QOL.  Baseline:  Goal status: INITIAL  3.  Patient will demonstrate functional pain free cervical ROM for improved ease of interactions with friends.  Baseline:  Goal status: INITIAL  4.  Patient will demonstrate improved posture to decrease muscle imbalance. Baseline:  Goal status: INITIAL  5.  Patient will report 65 on neck FOTO to demonstrate improved functional ability.  Baseline: 57 Goal status: INITIAL   PLAN: PT FREQUENCY: 2x/week  PT DURATION: 8 weeks  (gap from 09/10/21 - 09/25/21 while pt traveling on vacation/cruise)  PLANNED INTERVENTIONS: Therapeutic exercises, Therapeutic activity, Neuromuscular re-education, Balance training, Gait training, Patient/Family education, Joint mobilization, Dry Needling, Electrical stimulation, Spinal mobilization, Cryotherapy, Moist heat, Taping, Traction, Ultrasound,  Ionotophoresis '4mg'$ /ml Dexamethasone, Manual therapy, and Re-evaluation  PLAN FOR NEXT SESSION: Review initial HEP; progress postural flexibility and strengthening; manual therapy and modalities to address pain and abnormal muscle tension   Percival Spanish, PT 08/17/2021, 4:57 PM

## 2021-08-12 ENCOUNTER — Encounter: Payer: Self-pay | Admitting: Neurology

## 2021-08-12 ENCOUNTER — Other Ambulatory Visit: Payer: Self-pay | Admitting: Family Medicine

## 2021-08-17 ENCOUNTER — Other Ambulatory Visit: Payer: Self-pay

## 2021-08-17 ENCOUNTER — Encounter: Payer: Self-pay | Admitting: Physical Therapy

## 2021-08-17 ENCOUNTER — Ambulatory Visit: Payer: Medicare Other | Attending: Neurosurgery | Admitting: Physical Therapy

## 2021-08-17 DIAGNOSIS — R293 Abnormal posture: Secondary | ICD-10-CM | POA: Insufficient documentation

## 2021-08-17 DIAGNOSIS — M62838 Other muscle spasm: Secondary | ICD-10-CM | POA: Insufficient documentation

## 2021-08-17 DIAGNOSIS — M5412 Radiculopathy, cervical region: Secondary | ICD-10-CM | POA: Diagnosis not present

## 2021-08-17 DIAGNOSIS — R29898 Other symptoms and signs involving the musculoskeletal system: Secondary | ICD-10-CM | POA: Diagnosis not present

## 2021-08-17 DIAGNOSIS — M542 Cervicalgia: Secondary | ICD-10-CM | POA: Insufficient documentation

## 2021-08-17 DIAGNOSIS — M6281 Muscle weakness (generalized): Secondary | ICD-10-CM | POA: Diagnosis not present

## 2021-08-19 ENCOUNTER — Ambulatory Visit: Payer: Medicare Other | Admitting: Physical Therapy

## 2021-08-19 ENCOUNTER — Encounter: Payer: Self-pay | Admitting: Physical Therapy

## 2021-08-19 DIAGNOSIS — M5412 Radiculopathy, cervical region: Secondary | ICD-10-CM

## 2021-08-19 DIAGNOSIS — M62838 Other muscle spasm: Secondary | ICD-10-CM | POA: Diagnosis not present

## 2021-08-19 DIAGNOSIS — R293 Abnormal posture: Secondary | ICD-10-CM

## 2021-08-19 DIAGNOSIS — M542 Cervicalgia: Secondary | ICD-10-CM

## 2021-08-19 DIAGNOSIS — R29898 Other symptoms and signs involving the musculoskeletal system: Secondary | ICD-10-CM | POA: Diagnosis not present

## 2021-08-19 DIAGNOSIS — M6281 Muscle weakness (generalized): Secondary | ICD-10-CM

## 2021-08-19 NOTE — Therapy (Signed)
OUTPATIENT PHYSICAL THERAPY TREATMENT   Patient Name: Amanda Copeland MRN: [id number] DOB:05-17-1940, 81 y.o., female Today's Date: 08/19/2021   PT End of Session - 08/19/21 0843     Visit Number 2    Date for PT Re-Evaluation 10/12/21    Authorization Type Medicare & BCBS    PT Start Time 0843    PT Stop Time 0928    PT Time Calculation (min) 45 min    Activity Tolerance Patient tolerated treatment well    Behavior During Therapy [facility] for tasks assessed/performed              Past Medical History:  Diagnosis Date   Arthritis    Asthma    childhood now returning   Back pain affecting pregnancy 11/02/2014   Benign essential tremor 10/28/2014   Bilateral dry eyes    Breast cancer ([place])    right   Cancer ([place])    Breast   Dry eyes    Encounter for Medicare annual wellness exam 05/22/2016   Essential hypertension, benign 10/28/2014   H/O measles    H/O mumps    History of chicken pox    Lumbago 11/02/2014   Mixed hyperlipidemia 10/28/2014   Obesity 10/28/2014   Stenosis of cervical spine    Past Surgical History:  Procedure Laterality Date   BACK SURGERY     BREAST REDUCTION SURGERY Left    CHOLECYSTECTOMY     EYE SURGERY  2008   b/l cataracts removed, in [place] Bilateral    ROTATOR CUFF REPAIR Left    Patient Active Problem List   Diagnosis Date Noted   Osteoporosis 05/25/2021   Scapular dysfunction 03/17/2021   Compression fracture of T11 vertebra ([place]) 01/20/2021   SI joint arthritis 01/20/2021   Vitamin D deficiency 01/20/2021   Age-related osteoporosis with current pathological fracture 11/26/2020   Pain of right upper extremity 11/11/2020   Bradycardia 11/11/2020   Neck pain 03/11/2020   Tremor 03/01/2020   Hypothyroid 03/01/2020   Physical deconditioning 11/25/2019   At risk for obstructive sleep apnea 11/25/2019   Primary osteoarthritis of right shoulder 02/28/2019    Other forms of dyspnea 11/22/2018   Educated about COVID-19 virus infection 07/03/2018   Greater trochanteric pain syndrome of right lower extremity 03/18/2018   Chronic nonallergic rhinitis 12/20/2017   Cough 12/20/2017   Right flank pain 12/10/2017   Fatigue 07/12/2017   Morbid obesity ([place]) 03/05/2017   Chronic diastolic heart failure ([place]) 11/29/2016   Dyspnea on exertion 10/06/2016   Asthma 10/06/2016   Encounter for Medicare annual wellness exam 05/22/2016   Hyperglycemia 08/07/2015   Allergic 08/07/2015   Gout 08/07/2015   Leukocytosis 08/07/2015   Lower abdominal pain 04/21/2015   Low back pain 11/02/2014   Esophageal reflux 10/28/2014   Essential hypertension, benign 10/28/2014   Mixed hyperlipidemia 10/28/2014   Benign essential tremor 10/28/2014   Bilateral dry eyes    Moderate persistent asthma    Arthritis    Cancer ([place])    History of chicken pox     PCP:  [name], MD  REFERRING PROVIDER:  Kary Kos, MD  REFERRING DIAG:  M54.12 (ICD-10-CM) - Radiculopathy, cervical region  THERAPY DIAG:  Radiculopathy, cervical region  Cervicalgia  Abnormal posture  Muscle weakness (generalized)  Other muscle spasm  Other symptoms and signs involving the musculoskeletal system  RATIONALE FOR EVALUATION AND  TREATMENT:  Rehabilitation  ONSET DATE:  3-4 months  SUBJECTIVE:                                                                                                                                                                                                         SUBJECTIVE STATEMENT: Pt reports increased pain yesterday but not much pain thus far today.  PAIN:  Are you having pain? Yes: NPRS scale:  2/10 Pain location: R  side of neck extending across upper shoulder and down anterior upper arm Pain description: sharp Aggravating factors: turning her head, moving her R arm Relieving factors: ice, Aleve   PERTINENT HISTORY:  Chronic LBP with  h/o surgery for "ruptured disc", lumbar spinal stenosis, OA, B TKA, L RTC repair, chronic diastolic HF, HLD, moderate asthma, DOE, HTN, breast cancer, benign essential tremor, morbid obesity   PRECAUTIONS: Fall  WEIGHT BEARING RESTRICTIONS No  FALLS:  Has patient fallen in last 6 months? Yes. Number of falls 1  LIVING ENVIRONMENT: Lives with: lives with their spouse Lives in: House/apartment Stairs: Yes: External: 1 steps; over threshold Has following equipment at home: Single point cane, Environmental consultant - 2 wheeled, Environmental consultant - 4 wheeled, Wheelchair (manual), shower chair, and bed side commode  OCCUPATION: Retired  PLOF: Independent and Leisure: going on cruises, movies, playing cards with friends, has not been swimming like she used to  PATIENT GOALS: "To be able to move my neck w/o pain."  OBJECTIVE:   DIAGNOSTIC FINDINGS:   Cervical MRI - 07/07/21: 1. Degenerative changes of the cervical spine with moderate spinal canal stenosis at C5-6 and C6-7 with mild mass effect on the cord at these levels without cord signal abnormality.  2. Multilevel high-grade neural foraminal narrowing, as described above, severe bilaterally at C5-6.  PATIENT SURVEYS:  FOTO Neck = 57; predicted = 65  COGNITION: Overall cognitive status: Within functional limits for tasks assessed  SENSATION: WFL  POSTURE:  rounded shoulders, forward head, and increased thoracic kyphosis  PALPATION: Increased muscle tension bilaterally with TTP R>L in B cervical paraspinals, UT, LS teres group, lats, deltoids and pecs   CERVICAL ROM:   Active ROM AROM (deg) Eval 08/17/21  Flexion 47  Extension 38  Right lateral flexion 12  Left lateral flexion 19  Right rotation 52  Left rotation 47   (Blank rows = not tested)  UPPER EXTREMITY ROM:  Active ROM Right Eval 08/17/21 Left Eval 08/17/21  Shoulder flexion 91 109  Shoulder extension 27 37  Shoulder abduction 89 88  Shoulder adduction  Shoulder internal  rotation    Shoulder external rotation 30 48  Elbow flexion    Elbow extension    Wrist flexion    Wrist extension    Wrist ulnar deviation    Wrist radial deviation    Wrist pronation    Wrist supination     (Blank rows = not tested)  UPPER EXTREMITY MMT: (for avaialble ROM)  MMT Right Eval 08/17/21 Left Eval 08/17/21  Shoulder flexion 3+ 4-  Shoulder extension 4- 4  Shoulder abduction 3+ 4-  Shoulder adduction    Shoulder internal rotation 4 4  Shoulder external rotation 3+ 3+  Middle trapezius    Lower trapezius    Elbow flexion    Elbow extension    Wrist flexion    Wrist extension    Wrist ulnar deviation    Wrist radial deviation    Wrist pronation    Wrist supination    Grip strength     (Blank rows = not tested)  CERVICAL SPECIAL TESTS:  Spurling's test: Positive and Distraction test: Positive   TODAY'S TREATMENT:   08/19/21 THERAPEUTIC EXERCISE: to improve flexibility, strength and mobility.  Verbal and tactile cues throughout for technique. UBE - L1.0 x 6 min (3 min each fwd & back) Cervical retraction/chin tuck with 2 fingers on chin 10 x 3" Seated scap retraction + depression 10 x 3" - cues to maintain upright head position Standing R low pec stretch at doorframe 3 x 30 sec Standing YTB row + retraction 10 x 3" - cues to maintain upright head position Standing YTB retraction + mini-shoulder extension 10 x 3"  Seated R/L cervical rotation SNAGs with rolled pillowcase 10 x 3"  MANUAL THERAPY: To promote normalized muscle tension, improved flexibility, increased ROM, and reduced pain. STM, multiple manual TRP and gentle pin and stretch to R UT  SELF CARE: Instruction in [place] of a Theracane for self TPR/STM   08/17/21 THERAPEUTIC EXERCISE: Instruction in initial HEP (see below) to improve flexibility, strength and mobility.  Verbal and tactile cues throughout for technique.   PATIENT EDUCATION:  Education details: HEP progression, postural  awareness, and self-STM techniques to R upper shoulder using Theracane   Person educated: Patient Education method: Explanation, Demonstration, Verbal cues, and Handouts Education comprehension: verbalized understanding, returned demonstration, verbal cues required, and needs further education   HOME EXERCISE PROGRAM: Access Code: [email or web address] URL: [email or web address] Date: 08/19/2021 Prepared by: Annie Paras  Exercises - Seated Passive Cervical Retraction  - 2-3 x daily - 7 x weekly - 2 sets - 10 reps - 3-5 sec hold - Seated Scapular Retraction  - 2-3 x daily - 7 x weekly - 2 sets - 10 reps - 5 sec hold - Doorway Pec Stretch at 60 Degrees Abduction with Arm Straight  - 2-3 x daily - 7 x weekly - 3 reps - 30 sec hold - Standing Bilateral Low Shoulder Row with Anchored Resistance  - 1 x daily - 7 x weekly - 2 sets - 10 reps - 5 sec hold - Scapular Retraction with Resistance Advanced  - 1 x daily - 7 x weekly - 2 sets - 10 reps - 5 sec hold - Seated Assisted Cervical Rotation with Towel  - 1 x daily - 7 x weekly - 2 sets - 10 reps - 3 sec hold - Mid-Lower Cervical Extension SNAG with Strap  - 1 x daily - 7 x weekly - 2 sets - 10 reps - 3 sec hold -  Seated Correct Posture  - 1 x daily - 7 x weekly - 2 sets - 10 reps - 10 sec hold - Theracane Over Shoulder  - 1-2 x daily - 7 x weekly - 1-2 min hold  ASSESSMENT:  CLINICAL IMPRESSION: Amanda "[name]" reports she tried all of the HEP but note sure if she was doing them correctly. HEP reviewed with clarifications provided for doorway pec stretch as well as postural awareness with all exercises. Progressed postural strengthening with addition of YTB resisted scap retraction and introduced self-SNAGs to assist with cervical rotation and extension ROM with good tolerance reported but continued cues necessary for posture. HEP updated to reflect exercise progression.  MT addressing increased muscle tension and TTP in R upper shoulder - pt very  TTP initially but noted good relief following manual TPR. Education provided on options for self-STM/TPR using Theracane at home.  OBJECTIVE IMPAIRMENTS decreased activity tolerance, decreased knowledge of condition, decreased mobility, difficulty walking, decreased ROM, decreased strength, increased fascial restrictions, increased muscle spasms, impaired flexibility, impaired UE functional use, improper body mechanics, postural dysfunction, and pain.   ACTIVITY LIMITATIONS carrying, lifting, reach over head, and turning head  PARTICIPATION LIMITATIONS: meal prep, cleaning, laundry, and community activity  PERSONAL FACTORS Age, Fitness, Past/current experiences, Time since onset of injury/illness/exacerbation, Transportation, and 3+ comorbidities: Chronic LBP with h/o surgery for "ruptured disc", lumbar spinal stenosis, OA, B TKA, L RTC repair, chronic diastolic HF, HLD, moderate asthma, DOE, HTN, breast cancer, benign essential tremor, morbid obesity   are also affecting patient's functional outcome.   REHAB POTENTIAL: Good  CLINICAL DECISION MAKING: Evolving/moderate complexity  EVALUATION COMPLEXITY: Moderate   GOALS: Goals reviewed with patient? Yes  SHORT TERM GOALS: Target date: 09/07/2021   Patient will be independent with initial HEP.  Baseline:  Goal status: IN PROGRESS  2.  Patient to report reduction in frequency and intensity of R-sided neck and radicular UE pain by >/= 25%  Baseline:  Goal status: IN PROGRESS  LONG TERM GOALS: Target date: 10/12/2021   Patient will be independent with advanced/ongoing HEP to improve outcomes and carryover.  Baseline:  Goal status: IN PROGRESS  2.  Patient will report 50-75% improvement in neck pain to improve QOL.  Baseline:  Goal status: IN PROGRESS  3.  Patient will demonstrate functional pain free cervical ROM for improved ease of interactions with friends.  Baseline:  Goal status: IN PROGRESS  4.  Patient will demonstrate  improved posture to decrease muscle imbalance. Baseline:  Goal status: IN PROGRESS  5.  Patient will report 65 on neck FOTO to demonstrate improved functional ability.  Baseline: 57 Goal status: IN PROGRESS   PLAN: PT FREQUENCY: 2x/week  PT DURATION: 8 weeks  (gap from 09/10/21 - 09/25/21 while pt traveling on vacation/cruise)  PLANNED INTERVENTIONS: Therapeutic exercises, Therapeutic activity, Neuromuscular re-education, Balance training, Gait training, Patient/Family education, Joint mobilization, Dry Needling, Electrical stimulation, Spinal mobilization, Cryotherapy, Moist heat, Taping, Traction, Ultrasound, Ionotophoresis '4mg'$ /ml Dexamethasone, Manual therapy, and Re-evaluation  PLAN FOR NEXT SESSION: Review HEP PRN; progress postural flexibility and strengthening; manual therapy and modalities to address pain and abnormal muscle tension   Percival Spanish, PT 08/19/2021, 2:53 PM

## 2021-08-24 ENCOUNTER — Ambulatory Visit: Payer: Medicare Other | Admitting: Physical Therapy

## 2021-08-24 ENCOUNTER — Encounter: Payer: Self-pay | Admitting: Physical Therapy

## 2021-08-24 DIAGNOSIS — R29898 Other symptoms and signs involving the musculoskeletal system: Secondary | ICD-10-CM

## 2021-08-24 DIAGNOSIS — M542 Cervicalgia: Secondary | ICD-10-CM | POA: Diagnosis not present

## 2021-08-24 DIAGNOSIS — R293 Abnormal posture: Secondary | ICD-10-CM

## 2021-08-24 DIAGNOSIS — M5412 Radiculopathy, cervical region: Secondary | ICD-10-CM

## 2021-08-24 DIAGNOSIS — M62838 Other muscle spasm: Secondary | ICD-10-CM | POA: Diagnosis not present

## 2021-08-24 DIAGNOSIS — M6281 Muscle weakness (generalized): Secondary | ICD-10-CM

## 2021-08-24 NOTE — Therapy (Signed)
OUTPATIENT PHYSICAL THERAPY TREATMENT   Patient Name: Amanda Copeland MRN: [id number] DOB:[name] 07, 1941, 81 y.o., female Today's Date: 08/24/2021   PT End of Session - 08/24/21 1534     Visit Number 3    Date for PT Re-Evaluation 10/12/21    Authorization Type Medicare & BCBS    PT Start Time 9417    PT Stop Time 1618    PT Time Calculation (min) 44 min    Activity Tolerance Patient tolerated treatment well    Behavior During Therapy WFL for tasks assessed/performed               Past Medical History:  Diagnosis Date   Arthritis    Asthma    childhood now returning   Back pain affecting pregnancy 11/02/2014   Benign essential tremor 10/28/2014   Bilateral dry eyes    Breast cancer ([place])    right   Cancer ([place])    Breast   Dry eyes    Encounter for Medicare annual wellness exam 05/22/2016   Essential hypertension, benign 10/28/2014   H/O measles    H/O mumps    History of chicken pox    Lumbago 11/02/2014   Mixed hyperlipidemia 10/28/2014   Obesity 10/28/2014   Stenosis of cervical spine    Past Surgical History:  Procedure Laterality Date   BACK SURGERY     BREAST REDUCTION SURGERY Left    CHOLECYSTECTOMY     EYE SURGERY  2008   b/l cataracts removed, in [place] Bilateral    ROTATOR CUFF REPAIR Left    Patient Active Problem List   Diagnosis Date Noted   Osteoporosis 05/25/2021   Scapular dysfunction 03/17/2021   Compression fracture of T11 vertebra ([place]) 01/20/2021   SI joint arthritis 01/20/2021   Vitamin D deficiency 01/20/2021   Age-related osteoporosis with current pathological fracture 11/26/2020   Pain of right upper extremity 11/11/2020   Bradycardia 11/11/2020   Neck pain 03/11/2020   Tremor 03/01/2020   Hypothyroid 03/01/2020   Physical deconditioning 11/25/2019   At risk for obstructive sleep apnea 11/25/2019   Primary osteoarthritis of right shoulder 02/28/2019    Other forms of dyspnea 11/22/2018   Educated about COVID-19 virus infection 07/03/2018   Greater trochanteric pain syndrome of right lower extremity 03/18/2018   Chronic nonallergic rhinitis 12/20/2017   Cough 12/20/2017   Right flank pain 12/10/2017   Fatigue 07/12/2017   Morbid obesity ([place]) 03/05/2017   Chronic diastolic heart failure ([place]) 11/29/2016   Dyspnea on exertion 10/06/2016   Asthma 10/06/2016   Encounter for Medicare annual wellness exam 05/22/2016   Hyperglycemia 08/07/2015   Allergic 08/07/2015   Gout 08/07/2015   Leukocytosis 08/07/2015   Lower abdominal pain 04/21/2015   Low back pain 11/02/2014   Esophageal reflux 10/28/2014   Essential hypertension, benign 10/28/2014   Mixed hyperlipidemia 10/28/2014   Benign essential tremor 10/28/2014   Bilateral dry eyes    Moderate persistent asthma    Arthritis    Cancer ([place])    History of chicken pox     PCP:  [name], MD  REFERRING PROVIDER:  Kary Kos, MD  REFERRING DIAG:  M54.12 (ICD-10-CM) - Radiculopathy, cervical region  THERAPY DIAG:  Radiculopathy, cervical region  Cervicalgia  Abnormal posture  Muscle weakness (generalized)  Other muscle spasm  Other symptoms and signs involving the musculoskeletal system  RATIONALE FOR EVALUATION  AND TREATMENT:  Rehabilitation  ONSET DATE:  3-4 months  SUBJECTIVE:                                                                                                                                                                                                         SUBJECTIVE STATEMENT: Pt reports no pain in her next but still notes intermittent pain in her R upper anterior arm.  PAIN:  Are you having pain? Yes: NPRS scale: intermittently up to 8/10 Pain location: R upper anterior upper arm Pain description: sharp  PERTINENT HISTORY:  Chronic LBP with h/o surgery for "ruptured disc", lumbar spinal stenosis, OA, B TKA, L RTC repair, chronic  diastolic HF, HLD, moderate asthma, DOE, HTN, breast cancer, benign essential tremor, morbid obesity   PRECAUTIONS: Fall  WEIGHT BEARING RESTRICTIONS No  FALLS:  Has patient fallen in last 6 months? Yes. Number of falls 1  LIVING ENVIRONMENT: Lives with: lives with their spouse Lives in: House/apartment Stairs: Yes: External: 1 steps; over threshold Has following equipment at home: Single point cane, Environmental consultant - 2 wheeled, Environmental consultant - 4 wheeled, Wheelchair (manual), shower chair, and bed side commode  OCCUPATION: Retired  PLOF: Independent and Leisure: going on cruises, movies, playing cards with friends, has not been swimming like she used to  PATIENT GOALS: "To be able to move my neck w/o pain."  OBJECTIVE:   DIAGNOSTIC FINDINGS:   Cervical MRI - 07/07/21: 1. Degenerative changes of the cervical spine with moderate spinal canal stenosis at C5-6 and C6-7 with mild mass effect on the cord at these levels without cord signal abnormality.  2. Multilevel high-grade neural foraminal narrowing, as described above, severe bilaterally at C5-6.  PATIENT SURVEYS:  FOTO Neck = 57; predicted = 65  COGNITION: Overall cognitive status: Within functional limits for tasks assessed  SENSATION: WFL  POSTURE:  rounded shoulders, forward head, and increased thoracic kyphosis  PALPATION: Increased muscle tension bilaterally with TTP R>L in B cervical paraspinals, UT, LS teres group, lats, deltoids and pecs   CERVICAL ROM:   Active ROM AROM (deg) Eval 08/17/21  Flexion 47  Extension 38  Right lateral flexion 12  Left lateral flexion 19  Right rotation 52  Left rotation 47   (Blank rows = not tested)  UPPER EXTREMITY ROM:  Active ROM Right Eval 08/17/21 Left Eval 08/17/21  Shoulder flexion 91 109  Shoulder extension 27 37  Shoulder abduction 89 88  Shoulder adduction    Shoulder internal rotation    Shoulder external rotation 30 48  Elbow flexion  Elbow extension    Wrist  flexion    Wrist extension    Wrist ulnar deviation    Wrist radial deviation    Wrist pronation    Wrist supination     (Blank rows = not tested)  UPPER EXTREMITY MMT: (for avaialble ROM)  MMT Right Eval 08/17/21 Left Eval 08/17/21  Shoulder flexion 3+ 4-  Shoulder extension 4- 4  Shoulder abduction 3+ 4-  Shoulder adduction    Shoulder internal rotation 4 4  Shoulder external rotation 3+ 3+  Middle trapezius    Lower trapezius    Elbow flexion    Elbow extension    Wrist flexion    Wrist extension    Wrist ulnar deviation    Wrist radial deviation    Wrist pronation    Wrist supination    Grip strength     (Blank rows = not tested)  CERVICAL SPECIAL TESTS:  Spurling's test: Positive and Distraction test: Positive   TODAY'S TREATMENT:   08/24/21 THERAPEUTIC EXERCISE: to improve flexibility, strength and mobility.  Verbal and tactile cues throughout for technique. UBE - L1.0 x 6 min (3 min each fwd & back) Seated RTB row + retraction 10 x 3" - cues to maintain upright head position Seated RTB retraction + mini-shoulder extension 10 x 3"  Seated YTB cervical retraction 10 x 3" - pt noting slight discomfort on R behind her ear Supine cervical retraction in to pillow 10 x 3" - no pain   MANUAL THERAPY: To promote normalized muscle tension, improved flexibility, increased ROM, and reduced pain. Supine suboccipital release Gentle cervical retraction PROM in supine  STM/DTM to R>L suboccipitals, cervical paraspinals, and R SCM in supine Gentle cervical PROM in all directions STM/DTM and manual TPR to R pecs in sitting   08/19/21 THERAPEUTIC EXERCISE: to improve flexibility, strength and mobility.  Verbal and tactile cues throughout for technique. UBE - L1.0 x 6 min (3 min each fwd & back) Cervical retraction/chin tuck with 2 fingers on chin 10 x 3" Seated scap retraction + depression 10 x 3" - cues to maintain upright head position Standing R low pec stretch at  doorframe 3 x 30 sec Standing YTB row + retraction 10 x 3" - cues to maintain upright head position Standing YTB retraction + mini-shoulder extension 10 x 3"  Seated R/L cervical rotation SNAGs with rolled pillowcase 10 x 3"  MANUAL THERAPY: To promote normalized muscle tension, improved flexibility, increased ROM, and reduced pain. STM, multiple manual TRP and gentle pin and stretch to R UT  SELF CARE: Instruction in [place] of a Theracane for self TPR/STM   08/17/21 THERAPEUTIC EXERCISE: Instruction in initial HEP (see below) to improve flexibility, strength and mobility.  Verbal and tactile cues throughout for technique.   PATIENT EDUCATION:  Education details: HEP progression - YTB progressed to RTB for rows/retraction, postural awareness, and self-STM techniques to R pecs  using tennis ball on wall   Person educated: Patient Education method: Explanation, Demonstration, Verbal cues, and Handouts Education comprehension: verbalized understanding, returned demonstration, verbal cues required, and needs further education   HOME EXERCISE PROGRAM: Access Code: [email or web address] URL: [email or web address] Date: 08/19/2021 Prepared by: Annie Paras  Exercises - Seated Passive Cervical Retraction  - 2-3 x daily - 7 x weekly - 2 sets - 10 reps - 3-5 sec hold - Seated Scapular Retraction  - 2-3 x daily - 7 x weekly - 2 sets - 10 reps - 5 sec hold - Doorway  Pec Stretch at 60 Degrees Abduction with Arm Straight  - 2-3 x daily - 7 x weekly - 3 reps - 30 sec hold - Standing Bilateral Low Shoulder Row with Anchored Resistance  - 1 x daily - 7 x weekly - 2 sets - 10 reps - 5 sec hold - Scapular Retraction with Resistance Advanced  - 1 x daily - 7 x weekly - 2 sets - 10 reps - 5 sec hold - Seated Assisted Cervical Rotation with Towel  - 1 x daily - 7 x weekly - 2 sets - 10 reps - 3 sec hold - Mid-Lower Cervical Extension SNAG with Strap  - 1 x daily - 7 x weekly - 2 sets - 10 reps - 3 sec  hold - Seated Correct Posture  - 1 x daily - 7 x weekly - 2 sets - 10 reps - 10 sec hold - Theracane Over Shoulder  - 1-2 x daily - 7 x weekly - 1-2 min hold  ASSESSMENT:  CLINICAL IMPRESSION: Amanda "[name]" reports the HEP exercises seem to be helping and she feels that she has them down pretty well - STG #1 met. Pain reported only in intermittently in R upper arm today, but some pain and increased tension noted in R SCM during exercise attempts along with reports of tightness and increased tension in suboccipitals today - all addressed with with manual STM and MFR along with gentle PROM and stretching. [name] noting good relief with manual therapy. She was able to progress resistance with rows/retraction to RTB today, therefore red band provided for HEP.  OBJECTIVE IMPAIRMENTS decreased activity tolerance, decreased knowledge of condition, decreased mobility, difficulty walking, decreased ROM, decreased strength, increased fascial restrictions, increased muscle spasms, impaired flexibility, impaired UE functional use, improper body mechanics, postural dysfunction, and pain.   ACTIVITY LIMITATIONS carrying, lifting, reach over head, and turning head  PARTICIPATION LIMITATIONS: meal prep, cleaning, laundry, and community activity  PERSONAL FACTORS Age, Fitness, Past/current experiences, Time since onset of injury/illness/exacerbation, Transportation, and 3+ comorbidities: Chronic LBP with h/o surgery for "ruptured disc", lumbar spinal stenosis, OA, B TKA, L RTC repair, chronic diastolic HF, HLD, moderate asthma, DOE, HTN, breast cancer, benign essential tremor, morbid obesity   are also affecting patient's functional outcome.   REHAB POTENTIAL: Good  CLINICAL DECISION MAKING: Evolving/moderate complexity  EVALUATION COMPLEXITY: Moderate   GOALS: Goals reviewed with patient? Yes  SHORT TERM GOALS: Target date: 09/07/2021   Patient will be independent with initial HEP.  Baseline:  Goal status:  MET  08/24/21  2.  Patient to report reduction in frequency and intensity of R-sided neck and radicular UE pain by >/= 25%  Baseline:  Goal status: IN PROGRESS  LONG TERM GOALS: Target date: 10/12/2021   Patient will be independent with advanced/ongoing HEP to improve outcomes and carryover.  Baseline:  Goal status: IN PROGRESS  2.  Patient will report 50-75% improvement in neck pain to improve QOL.  Baseline:  Goal status: IN PROGRESS  3.  Patient will demonstrate functional pain free cervical ROM for improved ease of interactions with friends.  Baseline:  Goal status: IN PROGRESS  4.  Patient will demonstrate improved posture to decrease muscle imbalance. Baseline:  Goal status: IN PROGRESS  5.  Patient will report 65 on neck FOTO to demonstrate improved functional ability.  Baseline: 57 Goal status: IN PROGRESS   PLAN: PT FREQUENCY: 2x/week  PT DURATION: 8 weeks  (gap from 09/10/21 - 09/25/21 while pt traveling on vacation/cruise)  PLANNED INTERVENTIONS: Therapeutic exercises, Therapeutic activity, Neuromuscular re-education, Balance training, Gait training, Patient/Family education, Joint mobilization, Dry Needling, Electrical stimulation, Spinal mobilization, Cryotherapy, Moist heat, Taping, Traction, Ultrasound, Ionotophoresis 29m/ml Dexamethasone, Manual therapy, and Re-evaluation  PLAN FOR NEXT SESSION: progress postural flexibility and strengthening - review & update HEP PRN; manual therapy and modalities to address pain and abnormal muscle tension   JPercival Spanish PT 08/24/2021, 4:39 PM

## 2021-08-26 ENCOUNTER — Other Ambulatory Visit: Payer: Self-pay | Admitting: Interventional Cardiology

## 2021-08-26 ENCOUNTER — Other Ambulatory Visit: Payer: Self-pay | Admitting: Family Medicine

## 2021-08-26 ENCOUNTER — Ambulatory Visit: Payer: Medicare Other | Admitting: Physical Therapy

## 2021-08-26 ENCOUNTER — Telehealth: Payer: Self-pay | Admitting: Family Medicine

## 2021-08-26 ENCOUNTER — Encounter: Payer: Self-pay | Admitting: Physical Therapy

## 2021-08-26 DIAGNOSIS — M5412 Radiculopathy, cervical region: Secondary | ICD-10-CM

## 2021-08-26 DIAGNOSIS — R293 Abnormal posture: Secondary | ICD-10-CM

## 2021-08-26 DIAGNOSIS — R29898 Other symptoms and signs involving the musculoskeletal system: Secondary | ICD-10-CM

## 2021-08-26 DIAGNOSIS — M542 Cervicalgia: Secondary | ICD-10-CM | POA: Diagnosis not present

## 2021-08-26 DIAGNOSIS — M6281 Muscle weakness (generalized): Secondary | ICD-10-CM

## 2021-08-26 DIAGNOSIS — M62838 Other muscle spasm: Secondary | ICD-10-CM

## 2021-08-26 NOTE — Therapy (Signed)
OUTPATIENT PHYSICAL THERAPY TREATMENT   Patient Name: Amanda Copeland MRN: [id number] DOB:Jul 07, 1940, 81 y.o., female Today's Date: 08/26/2021   PT End of Session - 08/26/21 0841     Visit Number 4    Date for PT Re-Evaluation 10/12/21    Authorization Type Medicare & BCBS    PT Start Time 0841    PT Stop Time 0928    PT Time Calculation (min) 47 min    Activity Tolerance Patient tolerated treatment well    Behavior During Therapy [facility] for tasks assessed/performed                Past Medical History:  Diagnosis Date   Arthritis    Asthma    childhood now returning   Back pain affecting pregnancy 11/02/2014   Benign essential tremor 10/28/2014   Bilateral dry eyes    Breast cancer ([place])    right   Cancer ([place])    Breast   Dry eyes    Encounter for Medicare annual wellness exam 05/22/2016   Essential hypertension, benign 10/28/2014   H/O measles    H/O mumps    History of chicken pox    Lumbago 11/02/2014   Mixed hyperlipidemia 10/28/2014   Obesity 10/28/2014   Stenosis of cervical spine    Past Surgical History:  Procedure Laterality Date   BACK SURGERY     BREAST REDUCTION SURGERY Left    CHOLECYSTECTOMY     EYE SURGERY  2008   b/l cataracts removed, in [place] Bilateral    ROTATOR CUFF REPAIR Left    Patient Active Problem List   Diagnosis Date Noted   Osteoporosis 05/25/2021   Scapular dysfunction 03/17/2021   Compression fracture of T11 vertebra ([place]) 01/20/2021   SI joint arthritis 01/20/2021   Vitamin D deficiency 01/20/2021   Age-related osteoporosis with current pathological fracture 11/26/2020   Pain of right upper extremity 11/11/2020   Bradycardia 11/11/2020   Neck pain 03/11/2020   Tremor 03/01/2020   Hypothyroid 03/01/2020   Physical deconditioning 11/25/2019   At risk for obstructive sleep apnea 11/25/2019   Primary osteoarthritis of right shoulder  02/28/2019   Other forms of dyspnea 11/22/2018   Educated about COVID-19 virus infection 07/03/2018   Greater trochanteric pain syndrome of right lower extremity 03/18/2018   Chronic nonallergic rhinitis 12/20/2017   Cough 12/20/2017   Right flank pain 12/10/2017   Fatigue 07/12/2017   Morbid obesity ([place]) 03/05/2017   Chronic diastolic heart failure ([place]) 11/29/2016   Dyspnea on exertion 10/06/2016   Asthma 10/06/2016   Encounter for Medicare annual wellness exam 05/22/2016   Hyperglycemia 08/07/2015   Allergic 08/07/2015   Gout 08/07/2015   Leukocytosis 08/07/2015   Lower abdominal pain 04/21/2015   Low back pain 11/02/2014   Esophageal reflux 10/28/2014   Essential hypertension, benign 10/28/2014   Mixed hyperlipidemia 10/28/2014   Benign essential tremor 10/28/2014   Bilateral dry eyes    Moderate persistent asthma    Arthritis    Cancer ([place])    History of chicken pox     PCP:  [name], MD  REFERRING PROVIDER:  Kary Kos, MD  REFERRING DIAG:  M54.12 (ICD-10-CM) - Radiculopathy, cervical region  THERAPY DIAG:  Radiculopathy, cervical region  Cervicalgia  Abnormal posture  Muscle weakness (generalized)  Other muscle spasm  Other symptoms and signs involving the musculoskeletal system  RATIONALE FOR  EVALUATION AND TREATMENT:  Rehabilitation  ONSET DATE:  3-4 months  SUBJECTIVE:                                                                                                                                                                                                         SUBJECTIVE STATEMENT: Pt reports she was sore yesterday following her last PT session but resolving today other than some lingering soreness in her R upper arm.  PAIN:  Are you having pain? No  PERTINENT HISTORY:  Chronic LBP with h/o surgery for "ruptured disc", lumbar spinal stenosis, OA, B TKA, L RTC repair, chronic diastolic HF, HLD, moderate asthma, DOE, HTN, breast  cancer, benign essential tremor, morbid obesity   PRECAUTIONS: Fall  WEIGHT BEARING RESTRICTIONS No  FALLS:  Has patient fallen in last 6 months? Yes. Number of falls 1  LIVING ENVIRONMENT: Lives with: lives with their spouse Lives in: House/apartment Stairs: Yes: External: 1 steps; over threshold Has following equipment at home: Single point cane, Environmental consultant - 2 wheeled, Environmental consultant - 4 wheeled, Wheelchair (manual), shower chair, and bed side commode  OCCUPATION: Retired  PLOF: Independent and Leisure: going on cruises, movies, playing cards with friends, has not been swimming like she used to  PATIENT GOALS: "To be able to move my neck w/o pain."  OBJECTIVE:   DIAGNOSTIC FINDINGS:   Cervical MRI - 07/07/21: 1. Degenerative changes of the cervical spine with moderate spinal canal stenosis at C5-6 and C6-7 with mild mass effect on the cord at these levels without cord signal abnormality.  2. Multilevel high-grade neural foraminal narrowing, as described above, severe bilaterally at C5-6.  PATIENT SURVEYS:  FOTO Neck = 57; predicted = 65  COGNITION: Overall cognitive status: Within functional limits for tasks assessed  SENSATION: WFL  POSTURE:  rounded shoulders, forward head, and increased thoracic kyphosis  PALPATION: Increased muscle tension bilaterally with TTP R>L in B cervical paraspinals, UT, LS teres group, lats, deltoids and pecs   CERVICAL ROM:   Active ROM AROM (deg) Eval 08/17/21  Flexion 47  Extension 38  Right lateral flexion 12  Left lateral flexion 19  Right rotation 52  Left rotation 47   (Blank rows = not tested)  UPPER EXTREMITY ROM:  Active ROM Right Eval 08/17/21 Left Eval 08/17/21  Shoulder flexion 91 109  Shoulder extension 27 37  Shoulder abduction 89 88  Shoulder adduction    Shoulder internal rotation    Shoulder external rotation 30 48  Elbow flexion    Elbow extension    Wrist  flexion    Wrist extension    Wrist ulnar deviation     Wrist radial deviation    Wrist pronation    Wrist supination     (Blank rows = not tested)  UPPER EXTREMITY MMT: (for avaialble ROM)  MMT Right Eval 08/17/21 Left Eval 08/17/21  Shoulder flexion 3+ 4-  Shoulder extension 4- 4  Shoulder abduction 3+ 4-  Shoulder adduction    Shoulder internal rotation 4 4  Shoulder external rotation 3+ 3+  Middle trapezius    Lower trapezius    Elbow flexion    Elbow extension    Wrist flexion    Wrist extension    Wrist ulnar deviation    Wrist radial deviation    Wrist pronation    Wrist supination    Grip strength     (Blank rows = not tested)  CERVICAL SPECIAL TESTS:  Spurling's test: Positive and Distraction test: Positive   TODAY'S TREATMENT:   08/26/21 THERAPEUTIC EXERCISE: to improve flexibility, strength and mobility.  Verbal and tactile cues throughout for technique. UBE - L1.5-1.0 x 6 min (3 min each fwd & back) - resistance decreased after ~2 min d/t R upper arm/shoulder soreness R pec, anterior deltoid & biceps stretch sliding hand down back leg of chair 2 x 30 sec Scap retraction into 1/2 FR on back of chair 10 x 5"  MANUAL THERAPY: To promote normalized muscle tension, improved flexibility, and reduced pain. STM/DTM and manual TPR to R deltoids, biceps and pecs   08/24/21 THERAPEUTIC EXERCISE: to improve flexibility, strength and mobility.  Verbal and tactile cues throughout for technique. UBE - L1.0 x 6 min (3 min each fwd & back) Seated RTB row + retraction 10 x 3" - cues to maintain upright head position Seated RTB retraction + mini-shoulder extension 10 x 3"  Seated YTB cervical retraction 10 x 3" - pt noting slight discomfort on R behind her ear Supine cervical retraction in to pillow 10 x 3" - no pain   MANUAL THERAPY: To promote normalized muscle tension, improved flexibility, increased ROM, and reduced pain. Supine suboccipital release Gentle cervical retraction PROM in supine  STM/DTM to R>L  suboccipitals, cervical paraspinals, and R SCM in supine Gentle cervical PROM in all directions STM/DTM and manual TPR to R pecs in sitting   08/19/21 THERAPEUTIC EXERCISE: to improve flexibility, strength and mobility.  Verbal and tactile cues throughout for technique. UBE - L1.0 x 6 min (3 min each fwd & back) Cervical retraction/chin tuck with 2 fingers on chin 10 x 3" Seated scap retraction + depression 10 x 3" - cues to maintain upright head position Standing R low pec stretch at doorframe 3 x 30 sec Standing YTB row + retraction 10 x 3" - cues to maintain upright head position Standing YTB retraction + mini-shoulder extension 10 x 3"  Seated R/L cervical rotation SNAGs with rolled pillowcase 10 x 3"  MANUAL THERAPY: To promote normalized muscle tension, improved flexibility, increased ROM, and reduced pain. STM, multiple manual TRP and gentle pin and stretch to R UT  SELF CARE: Instruction in [place] of a Theracane for self TPR/STM   PATIENT EDUCATION:  Education details: postural awareness  Person educated: Patient Education method: Explanation, Demonstration, Verbal cues, and Handouts Education comprehension: verbalized understanding, returned demonstration, verbal cues required, and needs further education   HOME EXERCISE PROGRAM: Access Code: [email or web address] URL: [email or web address] Date: 08/19/2021 Prepared by: Annie Paras  Exercises - Seated Passive Cervical Retraction  -  2-3 x daily - 7 x weekly - 2 sets - 10 reps - 3-5 sec hold - Seated Scapular Retraction  - 2-3 x daily - 7 x weekly - 2 sets - 10 reps - 5 sec hold - Doorway Pec Stretch at 60 Degrees Abduction with Arm Straight  - 2-3 x daily - 7 x weekly - 3 reps - 30 sec hold - Standing Bilateral Low Shoulder Row with Anchored Resistance  - 1 x daily - 7 x weekly - 2 sets - 10 reps - 5 sec hold - Scapular Retraction with Resistance Advanced  - 1 x daily - 7 x weekly - 2 sets - 10 reps - 5 sec hold - Seated  Assisted Cervical Rotation with Towel  - 1 x daily - 7 x weekly - 2 sets - 10 reps - 3 sec hold - Mid-Lower Cervical Extension SNAG with Strap  - 1 x daily - 7 x weekly - 2 sets - 10 reps - 3 sec hold - Seated Correct Posture  - 1 x daily - 7 x weekly - 2 sets - 10 reps - 10 sec hold - Theracane Over Shoulder  - 1-2 x daily - 7 x weekly - 1-2 min hold  ASSESSMENT:  CLINICAL IMPRESSION: Destinie "[name]" reports some increased muscle soreness following the exercise progression last visit which seems to be resolving other than some lingering R upper arm soreness. Multiple taut tender bands and TPs identified in R anterior deltoid, pecs and biceps - addressed with manual therapy with some relief noted. Reinforced postural awareness and instructed pt to back down to YTB as opposed to RTB if increased muscle soreness persists.  OBJECTIVE IMPAIRMENTS decreased activity tolerance, decreased knowledge of condition, decreased mobility, difficulty walking, decreased ROM, decreased strength, increased fascial restrictions, increased muscle spasms, impaired flexibility, impaired UE functional use, improper body mechanics, postural dysfunction, and pain.   ACTIVITY LIMITATIONS carrying, lifting, reach over head, and turning head  PARTICIPATION LIMITATIONS: meal prep, cleaning, laundry, and community activity  PERSONAL FACTORS Age, Fitness, Past/current experiences, Time since onset of injury/illness/exacerbation, Transportation, and 3+ comorbidities: Chronic LBP with h/o surgery for "ruptured disc", lumbar spinal stenosis, OA, B TKA, L RTC repair, chronic diastolic HF, HLD, moderate asthma, DOE, HTN, breast cancer, benign essential tremor, morbid obesity   are also affecting patient's functional outcome.   REHAB POTENTIAL: Good  CLINICAL DECISION MAKING: Evolving/moderate complexity  EVALUATION COMPLEXITY: Moderate   GOALS: Goals reviewed with patient? Yes  SHORT TERM GOALS: Target date: 09/07/2021    Patient will be independent with initial HEP.  Baseline:  Goal status: MET  08/24/21  2.  Patient to report reduction in frequency and intensity of R-sided neck and radicular UE pain by >/= 25%  Baseline:  Goal status: IN PROGRESS  LONG TERM GOALS: Target date: 10/12/2021   Patient will be independent with advanced/ongoing HEP to improve outcomes and carryover.  Baseline:  Goal status: IN PROGRESS  2.  Patient will report 50-75% improvement in neck pain to improve QOL.  Baseline:  Goal status: IN PROGRESS  3.  Patient will demonstrate functional pain free cervical ROM for improved ease of interactions with friends.  Baseline:  Goal status: IN PROGRESS  4.  Patient will demonstrate improved posture to decrease muscle imbalance. Baseline:  Goal status: IN PROGRESS  5.  Patient will report 65 on neck FOTO to demonstrate improved functional ability.  Baseline: 57 Goal status: IN PROGRESS   PLAN: PT FREQUENCY: 2x/week  PT  DURATION: 8 weeks  (gap from 09/10/21 - 09/25/21 while pt traveling on vacation/cruise)  PLANNED INTERVENTIONS: Therapeutic exercises, Therapeutic activity, Neuromuscular re-education, Balance training, Gait training, Patient/Family education, Joint mobilization, Dry Needling, Electrical stimulation, Spinal mobilization, Cryotherapy, Moist heat, Taping, Traction, Ultrasound, Ionotophoresis 61m/ml Dexamethasone, Manual therapy, and Re-evaluation  PLAN FOR NEXT SESSION: progress postural flexibility and strengthening - review & update HEP PRN; manual therapy and modalities to address pain and abnormal muscle tension   JPercival Spanish PT 08/26/2021, 4:12 PM

## 2021-08-26 NOTE — Telephone Encounter (Signed)
Medication:   diltiazem (TIAZAC) 180 MG 24 hr capsule [[id number]]   Has the patient contacted their pharmacy? Yes.   (If no, request that the patient contact the pharmacy for the refill.) (If yes, when and what did the pharmacy advise?)  They would send a request and call PCP if you hear nothing back  Preferred Pharmacy (with phone number or street name):   [id number] [address] [id number] Phone:  [phone] Fax:  [phone]  Agent: Please be advised that RX refills may take up to 3 business days. We ask that you follow-up with your pharmacy.

## 2021-08-26 NOTE — Telephone Encounter (Signed)
Cardiologist discontinued this medication on 11/13/20 due to bradycardia. He started her on amlodipine instead.

## 2021-08-31 ENCOUNTER — Encounter: Payer: Self-pay | Admitting: Physical Therapy

## 2021-08-31 ENCOUNTER — Ambulatory Visit: Payer: Medicare Other | Admitting: Physical Therapy

## 2021-08-31 DIAGNOSIS — R293 Abnormal posture: Secondary | ICD-10-CM

## 2021-08-31 DIAGNOSIS — M542 Cervicalgia: Secondary | ICD-10-CM | POA: Diagnosis not present

## 2021-08-31 DIAGNOSIS — M62838 Other muscle spasm: Secondary | ICD-10-CM | POA: Diagnosis not present

## 2021-08-31 DIAGNOSIS — M6281 Muscle weakness (generalized): Secondary | ICD-10-CM | POA: Diagnosis not present

## 2021-08-31 DIAGNOSIS — M5412 Radiculopathy, cervical region: Secondary | ICD-10-CM

## 2021-08-31 DIAGNOSIS — R29898 Other symptoms and signs involving the musculoskeletal system: Secondary | ICD-10-CM

## 2021-08-31 NOTE — Therapy (Signed)
OUTPATIENT PHYSICAL THERAPY TREATMENT   Patient Name: Brendalyn Vallely MRN: [id number] DOB:07-11-1940, 81 y.o., female Today's Date: 08/31/2021   PT End of Session - 08/31/21 1358     Visit Number 5    Date for PT Re-Evaluation 10/12/21    Authorization Type Medicare & BCBS    PT Start Time 1638    PT Stop Time 1446    PT Time Calculation (min) 48 min    Activity Tolerance Patient tolerated treatment well    Behavior During Therapy WFL for tasks assessed/performed                 Past Medical History:  Diagnosis Date   Arthritis    Asthma    childhood now returning   Back pain affecting pregnancy 11/02/2014   Benign essential tremor 10/28/2014   Bilateral dry eyes    Breast cancer ([place])    right   Cancer ([place])    Breast   Dry eyes    Encounter for Medicare annual wellness exam 05/22/2016   Essential hypertension, benign 10/28/2014   H/O measles    H/O mumps    History of chicken pox    Lumbago 11/02/2014   Mixed hyperlipidemia 10/28/2014   Obesity 10/28/2014   Stenosis of cervical spine    Past Surgical History:  Procedure Laterality Date   BACK SURGERY     BREAST REDUCTION SURGERY Left    CHOLECYSTECTOMY     EYE SURGERY  2008   b/l cataracts removed, in [place] Bilateral    ROTATOR CUFF REPAIR Left    Patient Active Problem List   Diagnosis Date Noted   Osteoporosis 05/25/2021   Scapular dysfunction 03/17/2021   Compression fracture of T11 vertebra ([place]) 01/20/2021   SI joint arthritis 01/20/2021   Vitamin D deficiency 01/20/2021   Age-related osteoporosis with current pathological fracture 11/26/2020   Pain of right upper extremity 11/11/2020   Bradycardia 11/11/2020   Neck pain 03/11/2020   Tremor 03/01/2020   Hypothyroid 03/01/2020   Physical deconditioning 11/25/2019   At risk for obstructive sleep apnea 11/25/2019   Primary osteoarthritis of right shoulder  02/28/2019   Other forms of dyspnea 11/22/2018   Educated about COVID-19 virus infection 07/03/2018   Greater trochanteric pain syndrome of right lower extremity 03/18/2018   Chronic nonallergic rhinitis 12/20/2017   Cough 12/20/2017   Right flank pain 12/10/2017   Fatigue 07/12/2017   Morbid obesity ([place]) 03/05/2017   Chronic diastolic heart failure ([place]) 11/29/2016   Dyspnea on exertion 10/06/2016   Asthma 10/06/2016   Encounter for Medicare annual wellness exam 05/22/2016   Hyperglycemia 08/07/2015   Allergic 08/07/2015   Gout 08/07/2015   Leukocytosis 08/07/2015   Lower abdominal pain 04/21/2015   Low back pain 11/02/2014   Esophageal reflux 10/28/2014   Essential hypertension, benign 10/28/2014   Mixed hyperlipidemia 10/28/2014   Benign essential tremor 10/28/2014   Bilateral dry eyes    Moderate persistent asthma    Arthritis    Cancer ([place])    History of chicken pox     PCP:  [name], MD  REFERRING PROVIDER:  Kary Kos, MD  REFERRING DIAG:  M54.12 (ICD-10-CM) - Radiculopathy, cervical region  THERAPY DIAG:  Radiculopathy, cervical region  Cervicalgia  Abnormal posture  Muscle weakness (generalized)  Other muscle spasm  Other symptoms and signs involving the musculoskeletal system  RATIONALE  FOR EVALUATION AND TREATMENT:  Rehabilitation  ONSET DATE:  3-4 months  SUBJECTIVE:                                                                                                                                                                                                         SUBJECTIVE STATEMENT: Pt reports her pain has not bothered her much today but still lets her know its there.  PAIN:  Are you having pain? Yes: NPRS scale:  6/10 Pain location: R side of neck and down R arm to forearm Pain description: "hurts" with pressure in R forearm  PERTINENT HISTORY:  Chronic LBP with h/o surgery for "ruptured disc", lumbar spinal stenosis, OA, B TKA,  L RTC repair, chronic diastolic HF, HLD, moderate asthma, DOE, HTN, breast cancer, benign essential tremor, morbid obesity   PRECAUTIONS: Fall  WEIGHT BEARING RESTRICTIONS No  FALLS:  Has patient fallen in last 6 months? Yes. Number of falls 1  LIVING ENVIRONMENT: Lives with: lives with their spouse Lives in: House/apartment Stairs: Yes: External: 1 steps; over threshold Has following equipment at home: Single point cane, Environmental consultant - 2 wheeled, Environmental consultant - 4 wheeled, Wheelchair (manual), shower chair, and bed side commode  OCCUPATION: Retired  PLOF: Independent and Leisure: going on cruises, movies, playing cards with friends, has not been swimming like she used to  PATIENT GOALS: "To be able to move my neck w/o pain."  OBJECTIVE:   DIAGNOSTIC FINDINGS:   Cervical MRI - 07/07/21: 1. Degenerative changes of the cervical spine with moderate spinal canal stenosis at C5-6 and C6-7 with mild mass effect on the cord at these levels without cord signal abnormality.  2. Multilevel high-grade neural foraminal narrowing, as described above, severe bilaterally at C5-6.  PATIENT SURVEYS:  FOTO Neck = 57; predicted = 65  COGNITION: Overall cognitive status: Within functional limits for tasks assessed  SENSATION: WFL  POSTURE:  rounded shoulders, forward head, and increased thoracic kyphosis  PALPATION: Increased muscle tension bilaterally with TTP R>L in B cervical paraspinals, UT, LS teres group, lats, deltoids and pecs   CERVICAL ROM:   Active ROM AROM (deg) Eval 08/17/21  Flexion 47  Extension 38  Right lateral flexion 12  Left lateral flexion 19  Right rotation 52  Left rotation 47   (Blank rows = not tested)  UPPER EXTREMITY ROM:  Active ROM Right Eval 08/17/21 Left Eval 08/17/21  Shoulder flexion 91 109  Shoulder extension 27 37  Shoulder abduction 89 88  Shoulder adduction    Shoulder internal rotation  Shoulder external rotation 30 48  Elbow flexion    Elbow  extension    Wrist flexion    Wrist extension    Wrist ulnar deviation    Wrist radial deviation    Wrist pronation    Wrist supination     (Blank rows = not tested)  UPPER EXTREMITY MMT: (for avaialble ROM)  MMT Right Eval 08/17/21 Left Eval 08/17/21  Shoulder flexion 3+ 4-  Shoulder extension 4- 4  Shoulder abduction 3+ 4-  Shoulder adduction    Shoulder internal rotation 4 4  Shoulder external rotation 3+ 3+  Middle trapezius    Lower trapezius    Elbow flexion    Elbow extension    Wrist flexion    Wrist extension    Wrist ulnar deviation    Wrist radial deviation    Wrist pronation    Wrist supination    Grip strength     (Blank rows = not tested)  CERVICAL SPECIAL TESTS:  Spurling's test: Positive and Distraction test: Positive   TODAY'S TREATMENT:   08/31/21 THERAPEUTIC EXERCISE: to improve flexibility, strength and mobility.  Verbal and tactile cues throughout for technique. UBE - L1.0 x 6 min (3 min each fwd & back)  Seated R pec, anterior deltoid & biceps stretch holding onto far/back side of treatment table 2 x 30 sec Seated YTB row + retraction 10 x 3" - cues to maintain upright head position Seated YTB retraction + mini-shoulder extension 10 x 3"   MANUAL THERAPY: To promote normalized muscle tension, improved flexibility, increased ROM, and reduced pain. Skilled palpation and monitoring of soft tissue during DN Trigger Point Dry-Needling  Treatment instructions: Expect mild to moderate muscle soreness. S/S of pneumothorax if dry needled over a lung field, and to seek immediate medical attention should they occur. Patient verbalized understanding of these instructions and education. Patient Consent Given: Yes Education handout provided: Yes Muscles treated: R pec major and minor, R anterior deltoid, R UT Electrical stimulation performed: No Parameters: N/A Treatment response/outcome: Twitch Response Elicited and Palpable Increase in Muscle  Length STM/DTM and manual TPR to muscles addressed with DN   08/26/21 THERAPEUTIC EXERCISE: to improve flexibility, strength and mobility.  Verbal and tactile cues throughout for technique. UBE - L1.5-1.0 x 6 min (3 min each fwd & back) - resistance decreased after ~2 min d/t R upper arm/shoulder soreness R pec, anterior deltoid & biceps stretch sliding hand down back leg of chair 2 x 30 sec Scap retraction into 1/2 FR on back of chair 10 x 5"  MANUAL THERAPY: To promote normalized muscle tension, improved flexibility, and reduced pain. STM/DTM and manual TPR to R deltoids, biceps and pecs   08/24/21 THERAPEUTIC EXERCISE: to improve flexibility, strength and mobility.  Verbal and tactile cues throughout for technique. UBE - L1.0 x 6 min (3 min each fwd & back) Seated RTB row + retraction 10 x 3" - cues to maintain upright head position Seated RTB retraction + mini-shoulder extension 10 x 3"  Seated YTB cervical retraction 10 x 3" - pt noting slight discomfort on R behind her ear Supine cervical retraction in to pillow 10 x 3" - no pain   MANUAL THERAPY: To promote normalized muscle tension, improved flexibility, increased ROM, and reduced pain. Supine suboccipital release Gentle cervical retraction PROM in supine  STM/DTM to R>L suboccipitals, cervical paraspinals, and R SCM in supine Gentle cervical PROM in all directions STM/DTM and manual TPR to R pecs in sitting  PATIENT EDUCATION:  Education details: role of DN and DN rational, procedure, outcomes, potential side effects, and recommended post-treatment exercises/activity  Person educated: Patient Education method: Explanation and Handouts Education comprehension: verbalized understanding   HOME EXERCISE PROGRAM: Access Code: [email or web address] URL: [email or web address] Date: 08/31/2021 Prepared by: Annie Paras  Exercises - Seated Passive Cervical Retraction  - 2-3 x daily - 7 x weekly - 2 sets - 10 reps - 3-5 sec  hold - Seated Scapular Retraction  - 2-3 x daily - 7 x weekly - 2 sets - 10 reps - 5 sec hold - Doorway Pec Stretch at 60 Degrees Abduction with Arm Straight  - 2-3 x daily - 7 x weekly - 3 reps - 30 sec hold - Standing Bilateral Low Shoulder Row with Anchored Resistance  - 1 x daily - 7 x weekly - 2 sets - 10 reps - 5 sec hold - Scapular Retraction with Resistance Advanced  - 1 x daily - 7 x weekly - 2 sets - 10 reps - 5 sec hold - Seated Assisted Cervical Rotation with Towel  - 1 x daily - 7 x weekly - 2 sets - 10 reps - 3 sec hold - Mid-Lower Cervical Extension SNAG with Strap  - 1 x daily - 7 x weekly - 2 sets - 10 reps - 3 sec hold - Seated Correct Posture  - 1 x daily - 7 x weekly - 2 sets - 10 reps - 10 sec hold - Theracane Over Shoulder  - 1-2 x daily - 7 x weekly - 1-2 min hold  Patient Education - Trigger Point Dry Needling  ASSESSMENT:  CLINICAL IMPRESSION: Dorota "[name]" reports reports 50-60% reduction in frequency of pain and 20% reduction in intensity pain overall since start of PT with less sleep interference and less need for pain meds. She notes continued limited tolerance for movements where she tries to bring her R arm back such as scap retraction + shoulder extension in HEP. Continued taut tender bands and TPs still evident in her R anterior and upper shoulder musculature, which appeared amenable to DN. After explanation of DN rational, procedures, outcomes and potential side effects, (including precautions with DN over the lung fields,) patient verbalized consent to DN treatment in conjunction with manual STM/DTM and TPR to reduce ttp/muscle tension. Muscles treated as indicated above. DN produced normal response with good twitches elicited resulting in palpable reduction in pain/ttp and muscle tension, with patient noting less discomfort with shoulder extension and scap retraction following DN. Pt educated to expect mild to moderate muscle soreness for up to 24-48 hrs and  instructed to continue prescribed home exercise program and current activity level with pt verbalizing understanding of theses instructions.   OBJECTIVE IMPAIRMENTS decreased activity tolerance, decreased knowledge of condition, decreased mobility, difficulty walking, decreased ROM, decreased strength, increased fascial restrictions, increased muscle spasms, impaired flexibility, impaired UE functional use, improper body mechanics, postural dysfunction, and pain.   ACTIVITY LIMITATIONS carrying, lifting, reach over head, and turning head  PARTICIPATION LIMITATIONS: meal prep, cleaning, laundry, and community activity  PERSONAL FACTORS Age, Fitness, Past/current experiences, Time since onset of injury/illness/exacerbation, Transportation, and 3+ comorbidities: Chronic LBP with h/o surgery for "ruptured disc", lumbar spinal stenosis, OA, B TKA, L RTC repair, chronic diastolic HF, HLD, moderate asthma, DOE, HTN, breast cancer, benign essential tremor, morbid obesity   are also affecting patient's functional outcome.   REHAB POTENTIAL: Good  CLINICAL DECISION MAKING: Evolving/moderate complexity  EVALUATION COMPLEXITY: Moderate   GOALS:  Goals reviewed with patient? Yes  SHORT TERM GOALS: Target date: 09/07/2021   Patient will be independent with initial HEP.  Baseline:  Goal status: MET  08/24/21  2.  Patient to report reduction in frequency and intensity of R-sided neck and radicular UE pain by >/= 25%  Baseline:  Goal status: IN PROGRESS  08/31/21 - Pt reports 50-60% reduction in frequency of pain and 20% reduction in intensity  LONG TERM GOALS: Target date: 10/12/2021   Patient will be independent with advanced/ongoing HEP to improve outcomes and carryover.  Baseline:  Goal status: IN PROGRESS  2.  Patient will report 50-75% improvement in neck pain to improve QOL.  Baseline:  Goal status: IN PROGRESS  3.  Patient will demonstrate functional pain free cervical ROM for improved ease  of interactions with friends.  Baseline:  Goal status: IN PROGRESS  4.  Patient will demonstrate improved posture to decrease muscle imbalance. Baseline:  Goal status: IN PROGRESS  5.  Patient will report 65 on neck FOTO to demonstrate improved functional ability.  Baseline: 57 Goal status: IN PROGRESS   PLAN: PT FREQUENCY: 2x/week  PT DURATION: 8 weeks  (gap from 09/10/21 - 09/25/21 while pt traveling on vacation/cruise)  PLANNED INTERVENTIONS: Therapeutic exercises, Therapeutic activity, Neuromuscular re-education, Balance training, Gait training, Patient/Family education, Joint mobilization, Dry Needling, Electrical stimulation, Spinal mobilization, Cryotherapy, Moist heat, Taping, Traction, Ultrasound, Ionotophoresis 52m/ml Dexamethasone, Manual therapy, and Re-evaluation  PLAN FOR NEXT SESSION: assess response to DN; progress postural flexibility and strengthening - review & update HEP PRN; manual therapy +/- DN and modalities to address pain and abnormal muscle tension   JPercival Spanish PT 08/31/2021, 6:16 PM

## 2021-09-02 DIAGNOSIS — M542 Cervicalgia: Secondary | ICD-10-CM | POA: Diagnosis not present

## 2021-09-06 ENCOUNTER — Ambulatory Visit: Payer: Medicare Other | Admitting: Physical Therapy

## 2021-09-06 DIAGNOSIS — R29898 Other symptoms and signs involving the musculoskeletal system: Secondary | ICD-10-CM

## 2021-09-06 DIAGNOSIS — M542 Cervicalgia: Secondary | ICD-10-CM

## 2021-09-06 DIAGNOSIS — M6281 Muscle weakness (generalized): Secondary | ICD-10-CM | POA: Diagnosis not present

## 2021-09-06 DIAGNOSIS — M62838 Other muscle spasm: Secondary | ICD-10-CM | POA: Diagnosis not present

## 2021-09-06 DIAGNOSIS — R293 Abnormal posture: Secondary | ICD-10-CM

## 2021-09-06 DIAGNOSIS — M5412 Radiculopathy, cervical region: Secondary | ICD-10-CM

## 2021-09-06 NOTE — Therapy (Addendum)
OUTPATIENT PHYSICAL THERAPY TREATMENT   Patient Name: Amanda Copeland MRN: [id number] DOB:September 15, 1940, 81 y.o., female Today's Date: 09/06/2021   PT End of Session - 09/06/21 1403     Visit Number 6    Date for PT Re-Evaluation 10/12/21    Authorization Type Medicare & BCBS    PT Start Time 5638    PT Stop Time 1444    PT Time Calculation (min) 41 min    Activity Tolerance Patient tolerated treatment well    Behavior During Therapy WFL for tasks assessed/performed                 Past Medical History:  Diagnosis Date   Arthritis    Asthma    childhood now returning   Back pain affecting pregnancy 11/02/2014   Benign essential tremor 10/28/2014   Bilateral dry eyes    Breast cancer ([place])    right   Cancer ([place])    Breast   Dry eyes    Encounter for Medicare annual wellness exam 05/22/2016   Essential hypertension, benign 10/28/2014   H/O measles    H/O mumps    History of chicken pox    Lumbago 11/02/2014   Mixed hyperlipidemia 10/28/2014   Obesity 10/28/2014   Stenosis of cervical spine    Past Surgical History:  Procedure Laterality Date   BACK SURGERY     BREAST REDUCTION SURGERY Left    CHOLECYSTECTOMY     EYE SURGERY  2008   b/l cataracts removed, in [place] Bilateral    ROTATOR CUFF REPAIR Left    Patient Active Problem List   Diagnosis Date Noted   Osteoporosis 05/25/2021   Scapular dysfunction 03/17/2021   Compression fracture of T11 vertebra ([place]) 01/20/2021   SI joint arthritis 01/20/2021   Vitamin D deficiency 01/20/2021   Age-related osteoporosis with current pathological fracture 11/26/2020   Pain of right upper extremity 11/11/2020   Bradycardia 11/11/2020   Neck pain 03/11/2020   Tremor 03/01/2020   Hypothyroid 03/01/2020   Physical deconditioning 11/25/2019   At risk for obstructive sleep apnea 11/25/2019   Primary osteoarthritis of right shoulder  02/28/2019   Other forms of dyspnea 11/22/2018   Educated about COVID-19 virus infection 07/03/2018   Greater trochanteric pain syndrome of right lower extremity 03/18/2018   Chronic nonallergic rhinitis 12/20/2017   Cough 12/20/2017   Right flank pain 12/10/2017   Fatigue 07/12/2017   Morbid obesity ([place]) 03/05/2017   Chronic diastolic heart failure ([place]) 11/29/2016   Dyspnea on exertion 10/06/2016   Asthma 10/06/2016   Encounter for Medicare annual wellness exam 05/22/2016   Hyperglycemia 08/07/2015   Allergic 08/07/2015   Gout 08/07/2015   Leukocytosis 08/07/2015   Lower abdominal pain 04/21/2015   Low back pain 11/02/2014   Esophageal reflux 10/28/2014   Essential hypertension, benign 10/28/2014   Mixed hyperlipidemia 10/28/2014   Benign essential tremor 10/28/2014   Bilateral dry eyes    Moderate persistent asthma    Arthritis    Cancer ([place])    History of chicken pox     PCP:  [name], MD  REFERRING PROVIDER:  Kary Kos, MD  REFERRING DIAG:  M54.12 (ICD-10-CM) - Radiculopathy, cervical region  THERAPY DIAG:  Radiculopathy, cervical region  Cervicalgia  Abnormal posture  Muscle weakness (generalized)  Other muscle spasm  Other symptoms and signs involving the musculoskeletal system  RATIONALE  FOR EVALUATION AND TREATMENT:  Rehabilitation  ONSET DATE:  3-4 months  SUBJECTIVE:                                                                                                                                                                                                         SUBJECTIVE STATEMENT: Pt reports her pain is not bad today but it was rough yesterday. She thinks the DN helped with the painful spot on the front of her R shoulder.  PAIN:  Are you having pain? Yes: NPRS scale: 5/10 Pain location: R side of neck and down into anterolateral R upper arm Pain description: "hurts" with pressure in R forearm  PERTINENT HISTORY:  Chronic LBP  with h/o surgery for "ruptured disc", lumbar spinal stenosis, OA, B TKA, L RTC repair, chronic diastolic HF, HLD, moderate asthma, DOE, HTN, breast cancer, benign essential tremor, morbid obesity   PRECAUTIONS: Fall  WEIGHT BEARING RESTRICTIONS No  FALLS:  Has patient fallen in last 6 months? Yes. Number of falls 1  LIVING ENVIRONMENT: Lives with: lives with their spouse Lives in: House/apartment Stairs: Yes: External: 1 steps; over threshold Has following equipment at home: Single point cane, Environmental consultant - 2 wheeled, Environmental consultant - 4 wheeled, Wheelchair (manual), shower chair, and bed side commode  OCCUPATION: Retired  PLOF: Independent and Leisure: going on cruises, movies, playing cards with friends, has not been swimming like she used to  PATIENT GOALS: "To be able to move my neck w/o pain."  OBJECTIVE:   DIAGNOSTIC FINDINGS:   Cervical MRI - 07/07/21: 1. Degenerative changes of the cervical spine with moderate spinal canal stenosis at C5-6 and C6-7 with mild mass effect on the cord at these levels without cord signal abnormality.  2. Multilevel high-grade neural foraminal narrowing, as described above, severe bilaterally at C5-6.  PATIENT SURVEYS:  FOTO Neck = 57; predicted = 65  COGNITION: Overall cognitive status: Within functional limits for tasks assessed  SENSATION: WFL  POSTURE:  rounded shoulders, forward head, and increased thoracic kyphosis  PALPATION: Increased muscle tension bilaterally with TTP R>L in B cervical paraspinals, UT, LS teres group, lats, deltoids and pecs   CERVICAL ROM:   Active ROM AROM (deg) Eval 08/17/21  Flexion 47  Extension 38  Right lateral flexion 12  Left lateral flexion 19  Right rotation 52  Left rotation 47   (Blank rows = not tested)  UPPER EXTREMITY ROM:  Active ROM Right Eval 08/17/21 Left Eval 08/17/21  Shoulder flexion 91 109  Shoulder extension 27 37  Shoulder abduction 89  88  Shoulder adduction    Shoulder internal  rotation    Shoulder external rotation 30 48  Elbow flexion    Elbow extension    Wrist flexion    Wrist extension    Wrist ulnar deviation    Wrist radial deviation    Wrist pronation    Wrist supination     (Blank rows = not tested)  UPPER EXTREMITY MMT: (for avaialble ROM)  MMT Right Eval 08/17/21 Left Eval 08/17/21  Shoulder flexion 3+ 4-  Shoulder extension 4- 4  Shoulder abduction 3+ 4-  Shoulder adduction    Shoulder internal rotation 4 4  Shoulder external rotation 3+ 3+  Middle trapezius    Lower trapezius    Elbow flexion    Elbow extension    Wrist flexion    Wrist extension    Wrist ulnar deviation    Wrist radial deviation    Wrist pronation    Wrist supination    Grip strength     (Blank rows = not tested)  CERVICAL SPECIAL TESTS:  Spurling's test: Positive and Distraction test: Positive   TODAY'S TREATMENT:   09/06/21 THERAPEUTIC EXERCISE: to improve flexibility, strength and mobility.  Verbal and tactile cues throughout for technique. UBE - L1.0 x 6 min (3 min each fwd & back)  Seated R UT stretch 2 x 30" Seated R LS stretch 2 x 30"  MANUAL THERAPY: To promote normalized muscle tension, improved flexibility, increased ROM, and reduced pain. Skilled palpation and monitoring of soft tissue during DN Trigger Point Dry-Needling  Treatment instructions: Expect mild to moderate muscle soreness. S/S of pneumothorax if dry needled over a lung field, and to seek immediate medical attention should they occur. Patient verbalized understanding of these instructions and education. Patient Consent Given: Yes Education handout provided: Previously provided Muscles treated: R LS, UT, upper and lower middle deltoids Electrical stimulation performed: No Parameters: N/A Treatment response/outcome: Twitch Response Elicited and Palpable Increase in Muscle Length SMT/DTM, manual TPR and pin & stretch to muscles addressed with DN + R biceps   08/31/21 THERAPEUTIC  EXERCISE: to improve flexibility, strength and mobility.  Verbal and tactile cues throughout for technique. UBE - L1.0 x 6 min (3 min each fwd & back)  Seated R pec, anterior deltoid & biceps stretch holding onto far/back side of treatment table 2 x 30 sec Seated YTB row + retraction 10 x 3" - cues to maintain upright head position Seated YTB retraction + mini-shoulder extension 10 x 3"   MANUAL THERAPY: To promote normalized muscle tension, improved flexibility, increased ROM, and reduced pain. Skilled palpation and monitoring of soft tissue during DN Trigger Point Dry-Needling  Treatment instructions: Expect mild to moderate muscle soreness. S/S of pneumothorax if dry needled over a lung field, and to seek immediate medical attention should they occur. Patient verbalized understanding of these instructions and education. Patient Consent Given: Yes Education handout provided: Yes Muscles treated: R pec major and minor, R anterior deltoid, R UT Electrical stimulation performed: No Parameters: N/A Treatment response/outcome: Twitch Response Elicited and Palpable Increase in Muscle Length STM/DTM and manual TPR to muscles addressed with DN   08/26/21 THERAPEUTIC EXERCISE: to improve flexibility, strength and mobility.  Verbal and tactile cues throughout for technique. UBE - L1.5-1.0 x 6 min (3 min each fwd & back) - resistance decreased after ~2 min d/t R upper arm/shoulder soreness R pec, anterior deltoid & biceps stretch sliding hand down back leg of chair 2 x 30  sec Scap retraction into 1/2 FR on back of chair 10 x 5"  MANUAL THERAPY: To promote normalized muscle tension, improved flexibility, and reduced pain. STM/DTM and manual TPR to R deltoids, biceps and pecs   08/24/21 THERAPEUTIC EXERCISE: to improve flexibility, strength and mobility.  Verbal and tactile cues throughout for technique. UBE - L1.0 x 6 min (3 min each fwd & back) Seated RTB row + retraction 10 x 3" - cues to  maintain upright head position Seated RTB retraction + mini-shoulder extension 10 x 3"  Seated YTB cervical retraction 10 x 3" - pt noting slight discomfort on R behind her ear Supine cervical retraction in to pillow 10 x 3" - no pain   MANUAL THERAPY: To promote normalized muscle tension, improved flexibility, increased ROM, and reduced pain. Supine suboccipital release Gentle cervical retraction PROM in supine  STM/DTM to R>L suboccipitals, cervical paraspinals, and R SCM in supine Gentle cervical PROM in all directions STM/DTM and manual TPR to R pecs in sitting   PATIENT EDUCATION:  Education details: role of DN and DN rational, procedure, outcomes, potential side effects, and recommended post-treatment exercises/activity  Person educated: Patient Education method: Explanation and Handouts Education comprehension: verbalized understanding   HOME EXERCISE PROGRAM: Access Code: [email or web address] URL: [email or web address] Date: 09/06/2021 Prepared by: Annie Paras  Exercises - Seated Passive Cervical Retraction  - 2-3 x daily - 7 x weekly - 2 sets - 10 reps - 3-5 sec hold - Seated Scapular Retraction  - 2-3 x daily - 7 x weekly - 2 sets - 10 reps - 5 sec hold - Doorway Pec Stretch at 60 Degrees Abduction with Arm Straight  - 2-3 x daily - 7 x weekly - 3 reps - 30 sec hold - Standing Bilateral Low Shoulder Row with Anchored Resistance  - 1 x daily - 7 x weekly - 2 sets - 10 reps - 5 sec hold - Scapular Retraction with Resistance Advanced  - 1 x daily - 7 x weekly - 2 sets - 10 reps - 5 sec hold - Seated Assisted Cervical Rotation with Towel  - 1 x daily - 7 x weekly - 2 sets - 10 reps - 3 sec hold - Mid-Lower Cervical Extension SNAG with Strap  - 1 x daily - 7 x weekly - 2 sets - 10 reps - 3 sec hold - Seated Correct Posture  - 1 x daily - 7 x weekly - 2 sets - 10 reps - 10 sec hold - Theracane Over Shoulder  - 1-2 x daily - 7 x weekly - 1-2 min hold - Seated Gentle Upper  Trapezius Stretch  - 2-3 x daily - 7 x weekly - 3 reps - 30 sec hold - Gentle Levator Scapulae Stretch  - 2-3 x daily - 7 x weekly - 3 reps - 30 sec hold  Patient Education - Trigger Point Dry Needling  ASSESSMENT:  CLINICAL IMPRESSION: Necha "[name]" notes some good relief from DN to R anterior shoulder last visit but still experiencing R lateral neck and upper shoulder pain with radicular pain into upper arm. She continues to have taut tender bands and TPs t/o her R lateral neck and upper and lateral shoulder which were also addressed with DN resulting in palpable twitch responses and decreased muscle tension and TTP. Instruction provided in stretches for R UT and LS to reinforce normalization of muscle tension with updated HEP instructions provided. [name] denies any concerns or need for review with prior HEP  exercises.  OBJECTIVE IMPAIRMENTS decreased activity tolerance, decreased knowledge of condition, decreased mobility, difficulty walking, decreased ROM, decreased strength, increased fascial restrictions, increased muscle spasms, impaired flexibility, impaired UE functional use, improper body mechanics, postural dysfunction, and pain.   ACTIVITY LIMITATIONS carrying, lifting, reach over head, and turning head  PARTICIPATION LIMITATIONS: meal prep, cleaning, laundry, and community activity  PERSONAL FACTORS Age, Fitness, Past/current experiences, Time since onset of injury/illness/exacerbation, Transportation, and 3+ comorbidities: Chronic LBP with h/o surgery for "ruptured disc", lumbar spinal stenosis, OA, B TKA, L RTC repair, chronic diastolic HF, HLD, moderate asthma, DOE, HTN, breast cancer, benign essential tremor, morbid obesity   are also affecting patient's functional outcome.   REHAB POTENTIAL: Good  CLINICAL DECISION MAKING: Evolving/moderate complexity  EVALUATION COMPLEXITY: Moderate   GOALS: Goals reviewed with patient? Yes  SHORT TERM GOALS: Target date: 09/07/2021    Patient will be independent with initial HEP.  Baseline:  Goal status: MET  08/24/21  2.  Patient to report reduction in frequency and intensity of R-sided neck and radicular UE pain by >/= 25%  Baseline:  Goal status: IN PROGRESS  08/31/21 - Pt reports 50-60% reduction in frequency of pain and 20% reduction in intensity  LONG TERM GOALS: Target date: 10/12/2021   Patient will be independent with advanced/ongoing HEP to improve outcomes and carryover.  Baseline:  Goal status: IN PROGRESS  2.  Patient will report 50-75% improvement in neck pain to improve QOL.  Baseline:  Goal status: IN PROGRESS  3.  Patient will demonstrate functional pain free cervical ROM for improved ease of interactions with friends.  Baseline:  Goal status: IN PROGRESS  4.  Patient will demonstrate improved posture to decrease muscle imbalance. Baseline:  Goal status: IN PROGRESS  5.  Patient will report 65 on neck FOTO to demonstrate improved functional ability.  Baseline: 57 Goal status: IN PROGRESS   PLAN: PT FREQUENCY: 2x/week  PT DURATION: 8 weeks  (gap from 09/10/21 - 09/25/21 while pt traveling on vacation/cruise)  PLANNED INTERVENTIONS: Therapeutic exercises, Therapeutic activity, Neuromuscular re-education, Balance training, Gait training, Patient/Family education, Joint mobilization, Dry Needling, Electrical stimulation, Spinal mobilization, Cryotherapy, Moist heat, Taping, Traction, Ultrasound, Ionotophoresis 89m/ml Dexamethasone, Manual therapy, and Re-evaluation  PLAN FOR NEXT SESSION: assess response to DN; progress postural flexibility and strengthening - review & update HEP PRN; manual therapy +/- DN and modalities to address pain and abnormal muscle tension   JPercival Spanish PT 09/06/2021, 6:28 PM

## 2021-09-08 ENCOUNTER — Ambulatory Visit: Payer: Medicare Other | Attending: Neurosurgery

## 2021-09-08 DIAGNOSIS — R293 Abnormal posture: Secondary | ICD-10-CM | POA: Insufficient documentation

## 2021-09-08 DIAGNOSIS — M5412 Radiculopathy, cervical region: Secondary | ICD-10-CM | POA: Insufficient documentation

## 2021-09-08 DIAGNOSIS — R29898 Other symptoms and signs involving the musculoskeletal system: Secondary | ICD-10-CM | POA: Insufficient documentation

## 2021-09-08 DIAGNOSIS — M542 Cervicalgia: Secondary | ICD-10-CM | POA: Insufficient documentation

## 2021-09-08 DIAGNOSIS — M62838 Other muscle spasm: Secondary | ICD-10-CM | POA: Diagnosis not present

## 2021-09-08 DIAGNOSIS — M6281 Muscle weakness (generalized): Secondary | ICD-10-CM | POA: Insufficient documentation

## 2021-09-08 NOTE — Therapy (Signed)
OUTPATIENT PHYSICAL THERAPY TREATMENT   Patient Name: Amanda Copeland MRN: [id number] DOB:1940/06/17, 81 y.o., female Today's Date: 09/08/2021   PT End of Session - 09/08/21 1102     Visit Number 7    Date for PT Re-Evaluation 10/12/21    Authorization Type Medicare & BCBS    PT Start Time 1017    PT Stop Time 1100    PT Time Calculation (min) 43 min    Activity Tolerance Patient tolerated treatment well    Behavior During Therapy WFL for tasks assessed/performed                  Past Medical History:  Diagnosis Date   Arthritis    Asthma    childhood now returning   Back pain affecting pregnancy 11/02/2014   Benign essential tremor 10/28/2014   Bilateral dry eyes    Breast cancer ([place])    right   Cancer ([place])    Breast   Dry eyes    Encounter for Medicare annual wellness exam 05/22/2016   Essential hypertension, benign 10/28/2014   H/O measles    H/O mumps    History of chicken pox    Lumbago 11/02/2014   Mixed hyperlipidemia 10/28/2014   Obesity 10/28/2014   Stenosis of cervical spine    Past Surgical History:  Procedure Laterality Date   BACK SURGERY     BREAST REDUCTION SURGERY Left    CHOLECYSTECTOMY     EYE SURGERY  2008   b/l cataracts removed, in [place] Bilateral    ROTATOR CUFF REPAIR Left    Patient Active Problem List   Diagnosis Date Noted   Osteoporosis 05/25/2021   Scapular dysfunction 03/17/2021   Compression fracture of T11 vertebra ([place]) 01/20/2021   SI joint arthritis 01/20/2021   Vitamin D deficiency 01/20/2021   Age-related osteoporosis with current pathological fracture 11/26/2020   Pain of right upper extremity 11/11/2020   Bradycardia 11/11/2020   Neck pain 03/11/2020   Tremor 03/01/2020   Hypothyroid 03/01/2020   Physical deconditioning 11/25/2019   At risk for obstructive sleep apnea 11/25/2019   Primary osteoarthritis of right shoulder  02/28/2019   Other forms of dyspnea 11/22/2018   Educated about COVID-19 virus infection 07/03/2018   Greater trochanteric pain syndrome of right lower extremity 03/18/2018   Chronic nonallergic rhinitis 12/20/2017   Cough 12/20/2017   Right flank pain 12/10/2017   Fatigue 07/12/2017   Morbid obesity ([place]) 03/05/2017   Chronic diastolic heart failure ([place]) 11/29/2016   Dyspnea on exertion 10/06/2016   Asthma 10/06/2016   Encounter for Medicare annual wellness exam 05/22/2016   Hyperglycemia 08/07/2015   Allergic 08/07/2015   Gout 08/07/2015   Leukocytosis 08/07/2015   Lower abdominal pain 04/21/2015   Low back pain 11/02/2014   Esophageal reflux 10/28/2014   Essential hypertension, benign 10/28/2014   Mixed hyperlipidemia 10/28/2014   Benign essential tremor 10/28/2014   Bilateral dry eyes    Moderate persistent asthma    Arthritis    Cancer ([place])    History of chicken pox     PCP:  [name], MD  REFERRING PROVIDER:  Kary Kos, MD  REFERRING DIAG:  M54.12 (ICD-10-CM) - Radiculopathy, cervical region  THERAPY DIAG:  Radiculopathy, cervical region  Cervicalgia  Abnormal posture  Muscle weakness (generalized)  Other muscle spasm  Other symptoms and signs involving the musculoskeletal system  RATIONALE FOR EVALUATION AND TREATMENT:  Rehabilitation  ONSET DATE:  3-4 months  SUBJECTIVE:                                                                                                                                                                                                         SUBJECTIVE STATEMENT: Pt reports improvement in neck pain but stiffness is still there.  PAIN:  Are you having pain? Yes: NPRS scale: 2/10 Pain location: R side of neck and down into anterolateral R upper arm Pain description: "hurts" with pressure in R forearm  PERTINENT HISTORY:  Chronic LBP with h/o surgery for "ruptured disc", lumbar spinal stenosis, OA, B TKA, L RTC  repair, chronic diastolic HF, HLD, moderate asthma, DOE, HTN, breast cancer, benign essential tremor, morbid obesity   PRECAUTIONS: Fall  WEIGHT BEARING RESTRICTIONS No  FALLS:  Has patient fallen in last 6 months? Yes. Number of falls 1  LIVING ENVIRONMENT: Lives with: lives with their spouse Lives in: House/apartment Stairs: Yes: External: 1 steps; over threshold Has following equipment at home: Single point cane, Environmental consultant - 2 wheeled, Environmental consultant - 4 wheeled, Wheelchair (manual), shower chair, and bed side commode  OCCUPATION: Retired  PLOF: Independent and Leisure: going on cruises, movies, playing cards with friends, has not been swimming like she used to  PATIENT GOALS: "To be able to move my neck w/o pain."  OBJECTIVE:   DIAGNOSTIC FINDINGS:   Cervical MRI - 07/07/21: 1. Degenerative changes of the cervical spine with moderate spinal canal stenosis at C5-6 and C6-7 with mild mass effect on the cord at these levels without cord signal abnormality.  2. Multilevel high-grade neural foraminal narrowing, as described above, severe bilaterally at C5-6.  PATIENT SURVEYS:  FOTO Neck = 57; predicted = 65  COGNITION: Overall cognitive status: Within functional limits for tasks assessed  SENSATION: WFL  POSTURE:  rounded shoulders, forward head, and increased thoracic kyphosis  PALPATION: Increased muscle tension bilaterally with TTP R>L in B cervical paraspinals, UT, LS teres group, lats, deltoids and pecs   CERVICAL ROM:   Active ROM AROM (deg) Eval 08/17/21  Flexion 47  Extension 38  Right lateral flexion 12  Left lateral flexion 19  Right rotation 52  Left rotation 47   (Blank rows = not tested)  UPPER EXTREMITY ROM:  Active ROM Right Eval 08/17/21 Left Eval 08/17/21  Shoulder flexion 91 109  Shoulder extension 27 37  Shoulder abduction 89 88  Shoulder adduction    Shoulder internal rotation    Shoulder external rotation 30  48  Elbow flexion    Elbow extension     Wrist flexion    Wrist extension    Wrist ulnar deviation    Wrist radial deviation    Wrist pronation    Wrist supination     (Blank rows = not tested)  UPPER EXTREMITY MMT: (for avaialble ROM)  MMT Right Eval 08/17/21 Left Eval 08/17/21  Shoulder flexion 3+ 4-  Shoulder extension 4- 4  Shoulder abduction 3+ 4-  Shoulder adduction    Shoulder internal rotation 4 4  Shoulder external rotation 3+ 3+  Middle trapezius    Lower trapezius    Elbow flexion    Elbow extension    Wrist flexion    Wrist extension    Wrist ulnar deviation    Wrist radial deviation    Wrist pronation    Wrist supination    Grip strength     (Blank rows = not tested)  CERVICAL SPECIAL TESTS:  Spurling's test: Positive and Distraction test: Positive   TODAY'S TREATMENT:  09/08/21 Therapeutic Exercise: UBE - L2.0 x 6 min (3 min each fwd & back)  Cervical Rotation 5x5" each direction Cervical SNAG rotation reviewed, cerv ext with pillowcase reviewed Cerv retraction with pillowcase 5x5" Standing row with RTB x 10 Standing extension RTB x 10  Manual Therapy: STM to R UT, LS, rhomboids  09/06/21 THERAPEUTIC EXERCISE: to improve flexibility, strength and mobility.  Verbal and tactile cues throughout for technique. UBE - L1.0 x 6 min (3 min each fwd & back)  Seated R UT stretch 2 x 30" Seated R LS stretch 2 x 30"  MANUAL THERAPY: To promote normalized muscle tension, improved flexibility, increased ROM, and reduced pain. Skilled palpation and monitoring of soft tissue during DN Trigger Point Dry-Needling  Treatment instructions: Expect mild to moderate muscle soreness. S/S of pneumothorax if dry needled over a lung field, and to seek immediate medical attention should they occur. Patient verbalized understanding of these instructions and education. Patient Consent Given: Yes Education handout provided: Previously provided Muscles treated: R LS, UT, upper and lower middle  deltoids Electrical stimulation performed: No Parameters: N/A Treatment response/outcome: Twitch Response Elicited and Palpable Increase in Muscle Length SMT/DTM, manual TPR and pin & stretch to muscles addressed with DN + R biceps   08/31/21 THERAPEUTIC EXERCISE: to improve flexibility, strength and mobility.  Verbal and tactile cues throughout for technique. UBE - L1.0 x 6 min (3 min each fwd & back)  Seated R pec, anterior deltoid & biceps stretch holding onto far/back side of treatment table 2 x 30 sec Seated YTB row + retraction 10 x 3" - cues to maintain upright head position Seated YTB retraction + mini-shoulder extension 10 x 3"   MANUAL THERAPY: To promote normalized muscle tension, improved flexibility, increased ROM, and reduced pain. Skilled palpation and monitoring of soft tissue during DN Trigger Point Dry-Needling  Treatment instructions: Expect mild to moderate muscle soreness. S/S of pneumothorax if dry needled over a lung field, and to seek immediate medical attention should they occur. Patient verbalized understanding of these instructions and education. Patient Consent Given: Yes Education handout provided: Yes Muscles treated: R pec major and minor, R anterior deltoid, R UT Electrical stimulation performed: No Parameters: N/A Treatment response/outcome: Twitch Response Elicited and Palpable Increase in Muscle Length STM/DTM and manual TPR to muscles addressed with DN   08/26/21 THERAPEUTIC EXERCISE: to improve flexibility, strength and mobility.  Verbal and tactile cues throughout for technique. UBE -  L1.5-1.0 x 6 min (3 min each fwd & back) - resistance decreased after ~2 min d/t R upper arm/shoulder soreness R pec, anterior deltoid & biceps stretch sliding hand down back leg of chair 2 x 30 sec Scap retraction into 1/2 FR on back of chair 10 x 5"  MANUAL THERAPY: To promote normalized muscle tension, improved flexibility, and reduced pain. STM/DTM and manual TPR  to R deltoids, biceps and pecs   08/24/21 THERAPEUTIC EXERCISE: to improve flexibility, strength and mobility.  Verbal and tactile cues throughout for technique. UBE - L1.0 x 6 min (3 min each fwd & back) Seated RTB row + retraction 10 x 3" - cues to maintain upright head position Seated RTB retraction + mini-shoulder extension 10 x 3"  Seated YTB cervical retraction 10 x 3" - pt noting slight discomfort on R behind her ear Supine cervical retraction in to pillow 10 x 3" - no pain   MANUAL THERAPY: To promote normalized muscle tension, improved flexibility, increased ROM, and reduced pain. Supine suboccipital release Gentle cervical retraction PROM in supine  STM/DTM to R>L suboccipitals, cervical paraspinals, and R SCM in supine Gentle cervical PROM in all directions STM/DTM and manual TPR to R pecs in sitting   PATIENT EDUCATION:  Education details: role of DN and DN rational, procedure, outcomes, potential side effects, and recommended post-treatment exercises/activity  Person educated: Patient Education method: Explanation and Handouts Education comprehension: verbalized understanding   HOME EXERCISE PROGRAM: Access Code: [email or web address] URL: [email or web address] Date: 09/06/2021 Prepared by: Annie Paras  Exercises - Seated Passive Cervical Retraction  - 2-3 x daily - 7 x weekly - 2 sets - 10 reps - 3-5 sec hold - Seated Scapular Retraction  - 2-3 x daily - 7 x weekly - 2 sets - 10 reps - 5 sec hold - Doorway Pec Stretch at 60 Degrees Abduction with Arm Straight  - 2-3 x daily - 7 x weekly - 3 reps - 30 sec hold - Standing Bilateral Low Shoulder Row with Anchored Resistance  - 1 x daily - 7 x weekly - 2 sets - 10 reps - 5 sec hold - Scapular Retraction with Resistance Advanced  - 1 x daily - 7 x weekly - 2 sets - 10 reps - 5 sec hold - Seated Assisted Cervical Rotation with Towel  - 1 x daily - 7 x weekly - 2 sets - 10 reps - 3 sec hold - Mid-Lower Cervical  Extension SNAG with Strap  - 1 x daily - 7 x weekly - 2 sets - 10 reps - 3 sec hold - Seated Correct Posture  - 1 x daily - 7 x weekly - 2 sets - 10 reps - 10 sec hold - Theracane Over Shoulder  - 1-2 x daily - 7 x weekly - 1-2 min hold - Seated Gentle Upper Trapezius Stretch  - 2-3 x daily - 7 x weekly - 3 reps - 30 sec hold - Gentle Levator Scapulae Stretch  - 2-3 x daily - 7 x weekly - 3 reps - 30 sec hold  Patient Education - Trigger Point Dry Needling  ASSESSMENT:  CLINICAL IMPRESSION: Continued working on neck ROM and postural strengthening. Required cues to fix hand placement with cervical rot SNAG. Cues given to depress shoulders with rows. Still very tight and TTP along R neck and shoulders but good response to MT noted by increased cerv rot post manual.  OBJECTIVE IMPAIRMENTS decreased activity tolerance, decreased knowledge of condition, decreased mobility,  difficulty walking, decreased ROM, decreased strength, increased fascial restrictions, increased muscle spasms, impaired flexibility, impaired UE functional use, improper body mechanics, postural dysfunction, and pain.   ACTIVITY LIMITATIONS carrying, lifting, reach over head, and turning head  PARTICIPATION LIMITATIONS: meal prep, cleaning, laundry, and community activity  PERSONAL FACTORS Age, Fitness, Past/current experiences, Time since onset of injury/illness/exacerbation, Transportation, and 3+ comorbidities: Chronic LBP with h/o surgery for "ruptured disc", lumbar spinal stenosis, OA, B TKA, L RTC repair, chronic diastolic HF, HLD, moderate asthma, DOE, HTN, breast cancer, benign essential tremor, morbid obesity   are also affecting patient's functional outcome.   REHAB POTENTIAL: Good  CLINICAL DECISION MAKING: Evolving/moderate complexity  EVALUATION COMPLEXITY: Moderate   GOALS: Goals reviewed with patient? Yes  SHORT TERM GOALS: Target date: 09/07/2021   Patient will be independent with initial HEP.   Baseline:  Goal status: MET  08/24/21  2.  Patient to report reduction in frequency and intensity of R-sided neck and radicular UE pain by >/= 25%  Baseline:  Goal status: IN PROGRESS  08/31/21 - Pt reports 50-60% reduction in frequency of pain and 20% reduction in intensity  LONG TERM GOALS: Target date: 10/12/2021   Patient will be independent with advanced/ongoing HEP to improve outcomes and carryover.  Baseline:  Goal status: IN PROGRESS  2.  Patient will report 50-75% improvement in neck pain to improve QOL.  Baseline:  Goal status: IN PROGRESS  3.  Patient will demonstrate functional pain free cervical ROM for improved ease of interactions with friends.  Baseline:  Goal status: IN PROGRESS  4.  Patient will demonstrate improved posture to decrease muscle imbalance. Baseline:  Goal status: IN PROGRESS  5.  Patient will report 65 on neck FOTO to demonstrate improved functional ability.  Baseline: 57 Goal status: IN PROGRESS   PLAN: PT FREQUENCY: 2x/week  PT DURATION: 8 weeks  (gap from 09/10/21 - 09/25/21 while pt traveling on vacation/cruise)  PLANNED INTERVENTIONS: Therapeutic exercises, Therapeutic activity, Neuromuscular re-education, Balance training, Gait training, Patient/Family education, Joint mobilization, Dry Needling, Electrical stimulation, Spinal mobilization, Cryotherapy, Moist heat, Taping, Traction, Ultrasound, Ionotophoresis 38m/ml Dexamethasone, Manual therapy, and Re-evaluation  PLAN FOR NEXT SESSION: progress postural flexibility and strengthening - review & update HEP PRN; manual therapy +/- DN and modalities to address pain and abnormal muscle tension   Verenis Nicosia L Kylen Schliep, PTA 09/08/2021, 11:02 AM

## 2021-09-28 ENCOUNTER — Encounter: Payer: Medicare Other | Admitting: Physical Therapy

## 2021-09-29 DIAGNOSIS — J4530 Mild persistent asthma, uncomplicated: Secondary | ICD-10-CM | POA: Diagnosis not present

## 2021-09-29 DIAGNOSIS — J309 Allergic rhinitis, unspecified: Secondary | ICD-10-CM | POA: Diagnosis not present

## 2021-09-30 ENCOUNTER — Ambulatory Visit: Payer: Medicare Other

## 2021-10-05 ENCOUNTER — Ambulatory Visit: Payer: Medicare Other | Admitting: Physical Therapy

## 2021-10-05 ENCOUNTER — Encounter: Payer: Self-pay | Admitting: Physical Therapy

## 2021-10-05 DIAGNOSIS — M62838 Other muscle spasm: Secondary | ICD-10-CM | POA: Diagnosis not present

## 2021-10-05 DIAGNOSIS — M5412 Radiculopathy, cervical region: Secondary | ICD-10-CM | POA: Diagnosis not present

## 2021-10-05 DIAGNOSIS — R293 Abnormal posture: Secondary | ICD-10-CM | POA: Diagnosis not present

## 2021-10-05 DIAGNOSIS — M6281 Muscle weakness (generalized): Secondary | ICD-10-CM | POA: Diagnosis not present

## 2021-10-05 DIAGNOSIS — R29898 Other symptoms and signs involving the musculoskeletal system: Secondary | ICD-10-CM | POA: Diagnosis not present

## 2021-10-05 DIAGNOSIS — M542 Cervicalgia: Secondary | ICD-10-CM | POA: Diagnosis not present

## 2021-10-05 NOTE — Therapy (Signed)
OUTPATIENT PHYSICAL THERAPY TREATMENT   Patient Name: Amanda Copeland MRN: [id number] DOB:[birth year], 81 y.o., female Today's Date: 10/05/2021   PT End of Session - 10/05/21 1359     Visit Number 8    Date for PT Re-Evaluation 10/12/21    Authorization Type Medicare & BCBS    PT Start Time 3500    PT Stop Time 1444    PT Time Calculation (min) 45 min    Activity Tolerance Patient tolerated treatment well    Behavior During Therapy WFL for tasks assessed/performed                   Past Medical History:  Diagnosis Date   Arthritis    Asthma    childhood now returning   Back pain affecting pregnancy 11/02/2014   Benign essential tremor 10/28/2014   Bilateral dry eyes    Breast cancer ([place])    right   Cancer ([place])    Breast   Dry eyes    Encounter for Medicare annual wellness exam 05/22/2016   Essential hypertension, benign 10/28/2014   H/O measles    H/O mumps    History of chicken pox    Lumbago 11/02/2014   Mixed hyperlipidemia 10/28/2014   Obesity 10/28/2014   Stenosis of cervical spine    Past Surgical History:  Procedure Laterality Date   BACK SURGERY     BREAST REDUCTION SURGERY Left    CHOLECYSTECTOMY     EYE SURGERY  2008   b/l cataracts removed, in [place] Bilateral    ROTATOR CUFF REPAIR Left    Patient Active Problem List   Diagnosis Date Noted   Osteoporosis 05/25/2021   Scapular dysfunction 03/17/2021   Compression fracture of T11 vertebra ([place]) 01/20/2021   SI joint arthritis 01/20/2021   Vitamin D deficiency 01/20/2021   Age-related osteoporosis with current pathological fracture 11/26/2020   Pain of right upper extremity 11/11/2020   Bradycardia 11/11/2020   Neck pain 03/11/2020   Tremor 03/01/2020   Hypothyroid 03/01/2020   Physical deconditioning 11/25/2019   At risk for obstructive sleep apnea 11/25/2019   Primary osteoarthritis of right shoulder  02/28/2019   Other forms of dyspnea 11/22/2018   Educated about COVID-19 virus infection 07/03/2018   Greater trochanteric pain syndrome of right lower extremity 03/18/2018   Chronic nonallergic rhinitis 12/20/2017   Cough 12/20/2017   Right flank pain 12/10/2017   Fatigue 07/12/2017   Morbid obesity ([place]) 03/05/2017   Chronic diastolic heart failure ([place]) 11/29/2016   Dyspnea on exertion 10/06/2016   Asthma 10/06/2016   Encounter for Medicare annual wellness exam 05/22/2016   Hyperglycemia 08/07/2015   Allergic 08/07/2015   Gout 08/07/2015   Leukocytosis 08/07/2015   Lower abdominal pain 04/21/2015   Low back pain 11/02/2014   Esophageal reflux 10/28/2014   Essential hypertension, benign 10/28/2014   Mixed hyperlipidemia 10/28/2014   Benign essential tremor 10/28/2014   Bilateral dry eyes    Moderate persistent asthma    Arthritis    Cancer ([place])    History of chicken pox     PCP:  [name], MD  REFERRING PROVIDER:  Kary Kos, MD  REFERRING DIAG:  M54.12 (ICD-10-CM) - Radiculopathy, cervical region  THERAPY DIAG:  Radiculopathy, cervical region  Cervicalgia  Abnormal posture  Muscle weakness (generalized)  Other muscle spasm  Other symptoms and signs involving the musculoskeletal system  RATIONALE FOR EVALUATION AND TREATMENT:  Rehabilitation  ONSET DATE:  3-4 months  SUBJECTIVE:                                                                                                                                                                                                         SUBJECTIVE STATEMENT: Return to PT delayed following cruise/vacation due to illness contracted while she was traveling. She reports her neck pain was well controlled while she was on her cruise and traveling but still had some occasional R shoulder/UE pain. At one point while traveling she attempted axe throwing with her R hand but was only able to throw the axe ~3 ft.  PAIN:   Are you having pain? No  PERTINENT HISTORY:  Chronic LBP with h/o surgery for "ruptured disc", lumbar spinal stenosis, OA, B TKA, L RTC repair, chronic diastolic HF, HLD, moderate asthma, DOE, HTN, breast cancer, benign essential tremor, morbid obesity   PRECAUTIONS: Fall  WEIGHT BEARING RESTRICTIONS No  FALLS:  Has patient fallen in last 6 months? Yes. Number of falls 1  LIVING ENVIRONMENT: Lives with: lives with their spouse Lives in: House/apartment Stairs: Yes: External: 1 steps; over threshold Has following equipment at home: Single point cane, Environmental consultant - 2 wheeled, Environmental consultant - 4 wheeled, Wheelchair (manual), shower chair, and bed side commode  OCCUPATION: Retired  PLOF: Independent and Leisure: going on cruises, movies, playing cards with friends, has not been swimming like she used to  PATIENT GOALS: "To be able to move my neck w/o pain."  OBJECTIVE:   DIAGNOSTIC FINDINGS:   Cervical MRI - 07/07/21: 1. Degenerative changes of the cervical spine with moderate spinal canal stenosis at C5-6 and C6-7 with mild mass effect on the cord at these levels without cord signal abnormality.  2. Multilevel high-grade neural foraminal narrowing, as described above, severe bilaterally at C5-6.  PATIENT SURVEYS:  FOTO Neck = 57; predicted = 65  COGNITION: Overall cognitive status: Within functional limits for tasks assessed  SENSATION: WFL  POSTURE:  rounded shoulders, forward head, and increased thoracic kyphosis  PALPATION: Increased muscle tension bilaterally with TTP R>L in B cervical paraspinals, UT, LS teres group, lats, deltoids and pecs   CERVICAL ROM:   Active ROM AROM (deg) Eval 08/17/21    10/05/21  Flexion 47 45  Extension 38 48  Right lateral flexion 12 21  Left lateral flexion 19 20  Right rotation 52 48  Left rotation 47 45   (Blank rows = not tested)  UPPER EXTREMITY ROM:  Active ROM Right  Eval 08/17/21 Left Eval 08/17/21  Right 10/05/21   Left 10/05/21  Shoulder flexion 91 109 104 122  Shoulder extension 27 37 35 44  Shoulder abduction 89 88 90 109  Shoulder adduction      Shoulder internal rotation      Shoulder external rotation 30 48 56 65  Elbow flexion      Elbow extension      Wrist flexion      Wrist extension      Wrist ulnar deviation      Wrist radial deviation      Wrist pronation      Wrist supination       (Blank rows = not tested)  UPPER EXTREMITY MMT: (for avaialble ROM)  MMT Right Eval 08/17/21 Left Eval 08/17/21  Right 10/05/21  Left 10/05/21  Shoulder flexion 3+ 4- 4 4  Shoulder extension 4- 4 4+ 4+  Shoulder abduction 3+ 4- 4- 4  Shoulder adduction      Shoulder internal rotation 4 4 4+ 4+  Shoulder external rotation 3+ 3+ 4- 4  Middle trapezius      Lower trapezius      Elbow flexion      Elbow extension      Wrist flexion      Wrist extension      Wrist ulnar deviation      Wrist radial deviation      Wrist pronation      Wrist supination      Grip strength       (Blank rows = not tested)  CERVICAL SPECIAL TESTS:  Spurling's test: Positive and Distraction test: Positive   TODAY'S TREATMENT:   10/05/21 THERAPEUTIC EXERCISE: to improve flexibility, strength and mobility.  Verbal and tactile cues throughout for technique.  UBE - L1.5 x 6 min (3 min each fwd & back)  Seated chin tuck with 2 finger guidance 10 x 5" Seated scap retraction 10 x 5", 2 sets (MT to address TP in R anterior deltoid between sets) - better tolerance for 2nd set Seated RTB rows 10 x 5" - emphasis on scap retraction & depression  Seated RTB scap retraction + shoulder extension to neutral 10 x 5"  Seated B cervical rotation SNAGs with pillowcase x 10 each direction  THERAPEUTIC ACTIVITIES: ROM & MMT assessment Goal Assessment  MANUAL THERAPY: To promote normalized muscle tension, improved flexibility, increased ROM, and reduced pain. STM/DTM and manual TPR to R anterior  deltoids   09/08/21 Therapeutic Exercise: UBE - L2.0 x 6 min (3 min each fwd & back)  Cervical Rotation 5x5" each direction Cervical SNAG rotation reviewed, cerv ext with pillowcase reviewed Cerv retraction with pillowcase 5x5" Standing row with RTB x 10 Standing extension RTB x 10  Manual Therapy: STM to R UT, LS, rhomboids   09/06/21 THERAPEUTIC EXERCISE: to improve flexibility, strength and mobility.  Verbal and tactile cues throughout for technique. UBE - L1.0 x 6 min (3 min each fwd & back)  Seated R UT stretch 2 x 30" Seated R LS stretch 2 x 30"  MANUAL THERAPY: To promote normalized muscle tension, improved flexibility, increased ROM, and reduced pain. Skilled palpation and monitoring of soft tissue during DN Trigger Point Dry-Needling  Treatment instructions: Expect mild to moderate muscle soreness. S/S of pneumothorax if dry needled over a lung field, and to seek immediate medical attention should they occur. Patient verbalized understanding of these instructions and education. Patient Consent Given: Yes Education handout provided: Previously  provided Muscles treated: R LS, UT, upper and lower middle deltoids Electrical stimulation performed: No Parameters: N/A Treatment response/outcome: Twitch Response Elicited and Palpable Increase in Muscle Length SMT/DTM, manual TPR and pin & stretch to muscles addressed with DN + R biceps   PATIENT EDUCATION:  Education details: role of DN and DN rational, procedure, outcomes, potential side effects, and recommended post-treatment exercises/activity  Person educated: Patient Education method: Explanation and Handouts Education comprehension: verbalized understanding   HOME EXERCISE PROGRAM: Access Code: [email or web address] URL: [email or web address] Date: 09/06/2021 Prepared by: Annie Paras  Exercises - Seated Passive Cervical Retraction  - 2-3 x daily - 7 x weekly - 2 sets - 10 reps - 3-5 sec hold - Seated Scapular  Retraction  - 2-3 x daily - 7 x weekly - 2 sets - 10 reps - 5 sec hold - Doorway Pec Stretch at 60 Degrees Abduction with Arm Straight  - 2-3 x daily - 7 x weekly - 3 reps - 30 sec hold - Standing Bilateral Low Shoulder Row with Anchored Resistance  - 1 x daily - 7 x weekly - 2 sets - 10 reps - 5 sec hold - Scapular Retraction with Resistance Advanced  - 1 x daily - 7 x weekly - 2 sets - 10 reps - 5 sec hold - Seated Assisted Cervical Rotation with Towel  - 1 x daily - 7 x weekly - 2 sets - 10 reps - 3 sec hold - Mid-Lower Cervical Extension SNAG with Strap  - 1 x daily - 7 x weekly - 2 sets - 10 reps - 3 sec hold - Seated Correct Posture  - 1 x daily - 7 x weekly - 2 sets - 10 reps - 10 sec hold - Theracane Over Shoulder  - 1-2 x daily - 7 x weekly - 1-2 min hold - Seated Gentle Upper Trapezius Stretch  - 2-3 x daily - 7 x weekly - 3 reps - 30 sec hold - Gentle Levator Scapulae Stretch  - 2-3 x daily - 7 x weekly - 3 reps - 30 sec hold  Patient Education - Trigger Point Dry Needling  ASSESSMENT:  CLINICAL IMPRESSION: Amanda "[name]" reports her neck pain is mostly gone but still has intermittent pain into R shoulder and arm although manageable while on her cruise and traveling. She reports 75% overall improvement in pain since start of PT. Gains noted in cervical and B UE ROM as well as B LE strength. Given current progress she feels like she should be able to transition to her HEP at the end of the current POC next week, therefore initiated review of HEP. Limitations noted with seated scap retraction due to increased pain in R anterior shoulder/upper arm but able to resolve this with manual STM/TPR to R anterior deltoid. She notes she tried axe throwing while on vacation but was only able to throw the axe 3 feet - this may have triggered some of the increased tension observed today. Reminders needed for hand placement and movement patterns with cervical SNAGs but well tolerated once technique  clarified. Will plan for review of remaining HEP exercises next visit along with any modifications or consolidation as indicated and addressing any questions/concerns identified by pt. If pt comfortable with HEP, will plan for transition to HEP as of her final visit, likely with a 30-day hold.  OBJECTIVE IMPAIRMENTS decreased activity tolerance, decreased knowledge of condition, decreased mobility, difficulty walking, decreased ROM, decreased strength, increased fascial restrictions, increased muscle spasms, impaired  flexibility, impaired UE functional use, improper body mechanics, postural dysfunction, and pain.   ACTIVITY LIMITATIONS carrying, lifting, reach over head, and turning head  PARTICIPATION LIMITATIONS: meal prep, cleaning, laundry, and community activity  PERSONAL FACTORS Age, Fitness, Past/current experiences, Time since onset of injury/illness/exacerbation, Transportation, and 3+ comorbidities: Chronic LBP with h/o surgery for "ruptured disc", lumbar spinal stenosis, OA, B TKA, L RTC repair, chronic diastolic HF, HLD, moderate asthma, DOE, HTN, breast cancer, benign essential tremor, morbid obesity   are also affecting patient's functional outcome.   REHAB POTENTIAL: Good  CLINICAL DECISION MAKING: Evolving/moderate complexity  EVALUATION COMPLEXITY: Moderate   GOALS: Goals reviewed with patient? Yes  SHORT TERM GOALS: Target date: 09/07/2021   Patient will be independent with initial HEP.  Baseline:  Goal status: MET  08/24/21  2.  Patient to report reduction in frequency and intensity of R-sided neck and radicular UE pain by >/= 25%  Baseline:  Goal status: MET  08/31/21 - Pt reports 50-60% reduction in frequency of pain and 20% reduction in intensity  LONG TERM GOALS: Target date: 10/12/2021   Patient will be independent with advanced/ongoing HEP to improve outcomes and carryover.  Baseline:  Goal status: IN PROGRESS  2.  Patient will report 50-75% improvement in neck  pain to improve QOL.  Baseline:  Goal status: MET  10/05/21 - Pt reports 75% improvement in pain  3.  Patient will demonstrate functional pain free cervical ROM for improved ease of interactions with friends.  Baseline:  Goal status: IN PROGRESS  10/05/21 - Pt still feels limited looking to the R but otherwise no restrictions reported   4.  Patient will demonstrate improved posture to decrease muscle imbalance. Baseline:  Goal status: IN PROGRESS  5.  Patient will report 65 on neck FOTO to demonstrate improved functional ability.  Baseline: 57 Goal status: IN PROGRESS   PLAN: PT FREQUENCY: 2x/week  PT DURATION: 8 weeks  (gap from 09/10/21 - 09/25/21 while pt traveling on vacation/cruise)  PLANNED INTERVENTIONS: Therapeutic exercises, Therapeutic activity, Neuromuscular re-education, Balance training, Gait training, Patient/Family education, Joint mobilization, Dry Needling, Electrical stimulation, Spinal mobilization, Cryotherapy, Moist heat, Taping, Traction, Ultrasound, Ionotophoresis 72m/ml Dexamethasone, Manual therapy, and Re-evaluation  PLAN FOR NEXT SESSION: finish HEP review in prep for transition to HEP at end of current POC; manual therapy +/- DN and modalities to address pain and abnormal muscle tension   JPercival Spanish PT 10/05/2021, 4:57 PM

## 2021-10-07 ENCOUNTER — Ambulatory Visit: Payer: Medicare Other

## 2021-10-07 DIAGNOSIS — R29898 Other symptoms and signs involving the musculoskeletal system: Secondary | ICD-10-CM | POA: Diagnosis not present

## 2021-10-07 DIAGNOSIS — M542 Cervicalgia: Secondary | ICD-10-CM | POA: Diagnosis not present

## 2021-10-07 DIAGNOSIS — M6281 Muscle weakness (generalized): Secondary | ICD-10-CM

## 2021-10-07 DIAGNOSIS — M62838 Other muscle spasm: Secondary | ICD-10-CM | POA: Diagnosis not present

## 2021-10-07 DIAGNOSIS — R293 Abnormal posture: Secondary | ICD-10-CM | POA: Diagnosis not present

## 2021-10-07 DIAGNOSIS — M5412 Radiculopathy, cervical region: Secondary | ICD-10-CM

## 2021-10-07 NOTE — Therapy (Signed)
OUTPATIENT PHYSICAL THERAPY TREATMENT   Patient Name: Amanda Copeland MRN: [id number] DOB:1940/05/14, 81 y.o., female Today's Date: 10/07/2021   PT End of Session - 10/07/21 1446     Visit Number 9    Date for PT Re-Evaluation 10/12/21    Authorization Type Medicare & BCBS    PT Start Time 7846    PT Stop Time 9629    PT Time Calculation (min) 43 min    Activity Tolerance Patient tolerated treatment well    Behavior During Therapy WFL for tasks assessed/performed                    Past Medical History:  Diagnosis Date   Arthritis    Asthma    childhood now returning   Back pain affecting pregnancy 11/02/2014   Benign essential tremor 10/28/2014   Bilateral dry eyes    Breast cancer ([place])    right   Cancer ([place])    Breast   Dry eyes    Encounter for Medicare annual wellness exam 05/22/2016   Essential hypertension, benign 10/28/2014   H/O measles    H/O mumps    History of chicken pox    Lumbago 11/02/2014   Mixed hyperlipidemia 10/28/2014   Obesity 10/28/2014   Stenosis of cervical spine    Past Surgical History:  Procedure Laterality Date   BACK SURGERY     BREAST REDUCTION SURGERY Left    CHOLECYSTECTOMY     EYE SURGERY  2008   b/l cataracts removed, in [place] Bilateral    ROTATOR CUFF REPAIR Left    Patient Active Problem List   Diagnosis Date Noted   Osteoporosis 05/25/2021   Scapular dysfunction 03/17/2021   Compression fracture of T11 vertebra ([place]) 01/20/2021   SI joint arthritis 01/20/2021   Vitamin D deficiency 01/20/2021   Age-related osteoporosis with current pathological fracture 11/26/2020   Pain of right upper extremity 11/11/2020   Bradycardia 11/11/2020   Neck pain 03/11/2020   Tremor 03/01/2020   Hypothyroid 03/01/2020   Physical deconditioning 11/25/2019   At risk for obstructive sleep apnea 11/25/2019   Primary osteoarthritis of right shoulder  02/28/2019   Other forms of dyspnea 11/22/2018   Educated about COVID-19 virus infection 07/03/2018   Greater trochanteric pain syndrome of right lower extremity 03/18/2018   Chronic nonallergic rhinitis 12/20/2017   Cough 12/20/2017   Right flank pain 12/10/2017   Fatigue 07/12/2017   Morbid obesity ([place]) 03/05/2017   Chronic diastolic heart failure ([place]) 11/29/2016   Dyspnea on exertion 10/06/2016   Asthma 10/06/2016   Encounter for Medicare annual wellness exam 05/22/2016   Hyperglycemia 08/07/2015   Allergic 08/07/2015   Gout 08/07/2015   Leukocytosis 08/07/2015   Lower abdominal pain 04/21/2015   Low back pain 11/02/2014   Esophageal reflux 10/28/2014   Essential hypertension, benign 10/28/2014   Mixed hyperlipidemia 10/28/2014   Benign essential tremor 10/28/2014   Bilateral dry eyes    Moderate persistent asthma    Arthritis    Cancer ([place])    History of chicken pox     PCP:  [name], MD  REFERRING PROVIDER:  Kary Kos, MD  REFERRING DIAG:  M54.12 (ICD-10-CM) - Radiculopathy, cervical region  THERAPY DIAG:  Radiculopathy, cervical region  Cervicalgia  Abnormal posture  Muscle weakness (generalized)  Other muscle spasm  Other symptoms and signs involving the musculoskeletal  system  RATIONALE FOR EVALUATION AND TREATMENT:  Rehabilitation  ONSET DATE:  3-4 months  SUBJECTIVE:                                                                                                                                                                                                         SUBJECTIVE STATEMENT: Doing good today, no pain.   PAIN:  Are you having pain? No  PERTINENT HISTORY:  Chronic LBP with h/o surgery for "ruptured disc", lumbar spinal stenosis, OA, B TKA, L RTC repair, chronic diastolic HF, HLD, moderate asthma, DOE, HTN, breast cancer, benign essential tremor, morbid obesity   PRECAUTIONS: Fall  WEIGHT BEARING RESTRICTIONS No  FALLS:   Has patient fallen in last 6 months? Yes. Number of falls 1  LIVING ENVIRONMENT: Lives with: lives with their spouse Lives in: House/apartment Stairs: Yes: External: 1 steps; over threshold Has following equipment at home: Single point cane, Environmental consultant - 2 wheeled, Environmental consultant - 4 wheeled, Wheelchair (manual), shower chair, and bed side commode  OCCUPATION: Retired  PLOF: Independent and Leisure: going on cruises, movies, playing cards with friends, has not been swimming like she used to  PATIENT GOALS: "To be able to move my neck w/o pain."  OBJECTIVE:   DIAGNOSTIC FINDINGS:   Cervical MRI - 07/07/21: 1. Degenerative changes of the cervical spine with moderate spinal canal stenosis at C5-6 and C6-7 with mild mass effect on the cord at these levels without cord signal abnormality.  2. Multilevel high-grade neural foraminal narrowing, as described above, severe bilaterally at C5-6.  PATIENT SURVEYS:  FOTO Neck = 57; predicted = 65  COGNITION: Overall cognitive status: Within functional limits for tasks assessed  SENSATION: WFL  POSTURE:  rounded shoulders, forward head, and increased thoracic kyphosis  PALPATION: Increased muscle tension bilaterally with TTP R>L in B cervical paraspinals, UT, LS teres group, lats, deltoids and pecs   CERVICAL ROM:   Active ROM AROM (deg) Eval 08/17/21    10/05/21  Flexion 47 45  Extension 38 48  Right lateral flexion 12 21  Left lateral flexion 19 20  Right rotation 52 48  Left rotation 47 45   (Blank rows = not tested)  UPPER EXTREMITY ROM:  Active ROM Right Eval 08/17/21 Left Eval 08/17/21  Right 10/05/21  Left 10/05/21  Shoulder flexion 91 109 104 122  Shoulder extension 27 37 35 44  Shoulder abduction 89 88 90 109  Shoulder adduction      Shoulder internal rotation      Shoulder external rotation 30 48  56 65  Elbow flexion      Elbow extension      Wrist flexion      Wrist extension      Wrist ulnar deviation      Wrist  radial deviation      Wrist pronation      Wrist supination       (Blank rows = not tested)  UPPER EXTREMITY MMT: (for avaialble ROM)  MMT Right Eval 08/17/21 Left Eval 08/17/21  Right 10/05/21  Left 10/05/21  Shoulder flexion 3+ 4- 4 4  Shoulder extension 4- 4 4+ 4+  Shoulder abduction 3+ 4- 4- 4  Shoulder adduction      Shoulder internal rotation 4 4 4+ 4+  Shoulder external rotation 3+ 3+ 4- 4  Middle trapezius      Lower trapezius      Elbow flexion      Elbow extension      Wrist flexion      Wrist extension      Wrist ulnar deviation      Wrist radial deviation      Wrist pronation      Wrist supination      Grip strength       (Blank rows = not tested)  CERVICAL SPECIAL TESTS:  Spurling's test: Positive and Distraction test: Positive   TODAY'S TREATMENT:  10/07/21 Therapeutic Exercise: Nustep L3x40mn Seated bil ER with yellow TB x 10 Seated shoulder flexion with 1lb weights x 10 Seated horizontal ABD yellow TB x 10 Standing row red TB 2x10 Standing chop and reverse chop with red TB x 10 each  10/05/21 THERAPEUTIC EXERCISE: to improve flexibility, strength and mobility.  Verbal and tactile cues throughout for technique.  UBE - L1.5 x 6 min (3 min each fwd & back)  Seated chin tuck with 2 finger guidance 10 x 5" Seated scap retraction 10 x 5", 2 sets (MT to address TP in R anterior deltoid between sets) - better tolerance for 2nd set Seated RTB rows 10 x 5" - emphasis on scap retraction & depression  Seated RTB scap retraction + shoulder extension to neutral 10 x 5"  Seated B cervical rotation SNAGs with pillowcase x 10 each direction  THERAPEUTIC ACTIVITIES: ROM & MMT assessment Goal Assessment  MANUAL THERAPY: To promote normalized muscle tension, improved flexibility, increased ROM, and reduced pain. STM/DTM and manual TPR to R anterior deltoids   09/08/21 Therapeutic Exercise: UBE - L2.0 x 6 min (3 min each fwd & back)  Cervical Rotation 5x5" each  direction Cervical SNAG rotation reviewed, cerv ext with pillowcase reviewed Cerv retraction with pillowcase 5x5" Standing row with RTB x 10 Standing extension RTB x 10  Manual Therapy: STM to R UT, LS, rhomboids   09/06/21 THERAPEUTIC EXERCISE: to improve flexibility, strength and mobility.  Verbal and tactile cues throughout for technique. UBE - L1.0 x 6 min (3 min each fwd & back)  Seated R UT stretch 2 x 30" Seated R LS stretch 2 x 30"  MANUAL THERAPY: To promote normalized muscle tension, improved flexibility, increased ROM, and reduced pain. Skilled palpation and monitoring of soft tissue during DN Trigger Point Dry-Needling  Treatment instructions: Expect mild to moderate muscle soreness. S/S of pneumothorax if dry needled over a lung field, and to seek immediate medical attention should they occur. Patient verbalized understanding of these instructions and education. Patient Consent Given: Yes Education handout provided: Previously provided Muscles treated: R LS, UT, upper and lower  middle deltoids Electrical stimulation performed: No Parameters: N/A Treatment response/outcome: Twitch Response Elicited and Palpable Increase in Muscle Length SMT/DTM, manual TPR and pin & stretch to muscles addressed with DN + R biceps   PATIENT EDUCATION:  Education details: HEP update  Person educated: Patient Education method: Explanation and Handouts Education comprehension: verbalized understanding   HOME EXERCISE PROGRAM: Access Code: [email or web address] URL: [email or web address] Date: 10/07/2021 Prepared by: Clarene Essex  Exercises - Doorway Pec Stretch at 60 Degrees Abduction with Arm Straight  - 2-3 x daily - 7 x weekly - 3 reps - 30 sec hold - Standing Bilateral Low Shoulder Row with Anchored Resistance  - 1 x daily - 3 x weekly - 2 sets - 10 reps - 5 sec hold - Scapular Retraction with Resistance Advanced  - 1 x daily - 3 x weekly - 2 sets - 10 reps - 5 sec hold -  Shoulder External Rotation and Scapular Retraction with Resistance  - 1 x daily - 3 x weekly - 3 sets - 10 reps - Standing Shoulder Flexion to 90 Degrees with Dumbbells  - 1 x daily - 3 x weekly - 3 sets - 10 reps - Seated Gentle Upper Trapezius Stretch  - 2-3 x daily - 7 x weekly - 3 reps - 30 sec hold - Gentle Levator Scapulae Stretch  - 2-3 x daily - 7 x weekly - 3 reps - 30 sec hold - Seated Assisted Cervical Rotation with Towel  - 1 x daily - 7 x weekly - 2 sets - 10 reps - 3 sec hold - Mid-Lower Cervical Extension SNAG with Strap  - 1 x daily - 7 x weekly - 2 sets - 10 reps - 3 sec hold  Patient Education - Trigger Point Dry Needling  ASSESSMENT:  CLINICAL IMPRESSION: Reviewed HEP to allow for independence with D/C. Modified exercises adding in more postural strengthening. Gave edu on skipping days for strengthening to avoid overuse/tendinitis. Cues needed with exercises to correct form and prevent compensations. Pt still onboard with D/C next visit.   OBJECTIVE IMPAIRMENTS decreased activity tolerance, decreased knowledge of condition, decreased mobility, difficulty walking, decreased ROM, decreased strength, increased fascial restrictions, increased muscle spasms, impaired flexibility, impaired UE functional use, improper body mechanics, postural dysfunction, and pain.   ACTIVITY LIMITATIONS carrying, lifting, reach over head, and turning head  PARTICIPATION LIMITATIONS: meal prep, cleaning, laundry, and community activity  PERSONAL FACTORS Age, Fitness, Past/current experiences, Time since onset of injury/illness/exacerbation, Transportation, and 3+ comorbidities: Chronic LBP with h/o surgery for "ruptured disc", lumbar spinal stenosis, OA, B TKA, L RTC repair, chronic diastolic HF, HLD, moderate asthma, DOE, HTN, breast cancer, benign essential tremor, morbid obesity   are also affecting patient's functional outcome.   REHAB POTENTIAL: Good  CLINICAL DECISION MAKING:  Evolving/moderate complexity  EVALUATION COMPLEXITY: Moderate   GOALS: Goals reviewed with patient? Yes  SHORT TERM GOALS: Target date: 09/07/2021   Patient will be independent with initial HEP.  Baseline:  Goal status: MET  08/24/21  2.  Patient to report reduction in frequency and intensity of R-sided neck and radicular UE pain by >/= 25%  Baseline:  Goal status: MET  08/31/21 - Pt reports 50-60% reduction in frequency of pain and 20% reduction in intensity  LONG TERM GOALS: Target date: 10/12/2021   Patient will be independent with advanced/ongoing HEP to improve outcomes and carryover.  Baseline:  Goal status: IN PROGRESS  2.  Patient will report 50-75% improvement in neck pain to  improve QOL.  Baseline:  Goal status: MET  10/05/21 - Pt reports 75% improvement in pain  3.  Patient will demonstrate functional pain free cervical ROM for improved ease of interactions with friends.  Baseline:  Goal status: IN PROGRESS  10/05/21 - Pt still feels limited looking to the R but otherwise no restrictions reported   4.  Patient will demonstrate improved posture to decrease muscle imbalance. Baseline:  Goal status: IN PROGRESS  5.  Patient will report 65 on neck FOTO to demonstrate improved functional ability.  Baseline: 57 Goal status: IN PROGRESS   PLAN: PT FREQUENCY: 2x/week  PT DURATION: 8 weeks  (gap from 09/10/21 - 09/25/21 while pt traveling on vacation/cruise)  PLANNED INTERVENTIONS: Therapeutic exercises, Therapeutic activity, Neuromuscular re-education, Balance training, Gait training, Patient/Family education, Joint mobilization, Dry Needling, Electrical stimulation, Spinal mobilization, Cryotherapy, Moist heat, Taping, Traction, Ultrasound, Ionotophoresis 54m/ml Dexamethasone, Manual therapy, and Re-evaluation  PLAN FOR NEXT SESSION: finish HEP review in prep for transition to HEP at end of current POC; manual therapy +/- DN and modalities to address pain and abnormal muscle  tension   Roxine Whittinghill L Abdel Effinger, PTA 10/07/2021, 2:47 PM

## 2021-10-12 ENCOUNTER — Encounter: Payer: Self-pay | Admitting: Physical Therapy

## 2021-10-12 ENCOUNTER — Ambulatory Visit: Payer: Medicare Other | Attending: Neurosurgery | Admitting: Physical Therapy

## 2021-10-12 DIAGNOSIS — M6281 Muscle weakness (generalized): Secondary | ICD-10-CM | POA: Diagnosis not present

## 2021-10-12 DIAGNOSIS — M62838 Other muscle spasm: Secondary | ICD-10-CM | POA: Diagnosis not present

## 2021-10-12 DIAGNOSIS — R29898 Other symptoms and signs involving the musculoskeletal system: Secondary | ICD-10-CM | POA: Insufficient documentation

## 2021-10-12 DIAGNOSIS — M542 Cervicalgia: Secondary | ICD-10-CM | POA: Insufficient documentation

## 2021-10-12 DIAGNOSIS — R293 Abnormal posture: Secondary | ICD-10-CM | POA: Insufficient documentation

## 2021-10-12 DIAGNOSIS — M5412 Radiculopathy, cervical region: Secondary | ICD-10-CM | POA: Insufficient documentation

## 2021-10-12 NOTE — Therapy (Signed)
OUTPATIENT PHYSICAL THERAPY TREATMENT / DISCHARGE SUMMARY   Patient Name: Amanda Copeland MRN: [id number] DOB:Sep 18, 1940, 81 y.o., female Today's Date: 10/12/2021  Progress Note  Reporting Period 08/17/2021 to 10/12/2021  See note below for Objective Data and Assessment of Progress/Goals.       PT End of Session - 10/12/21 1403     Visit Number 10    Date for PT Re-Evaluation 10/12/21    Authorization Type Medicare & BCBS    PT Start Time 1829    PT Stop Time 1432    PT Time Calculation (min) 29 min    Activity Tolerance Patient tolerated treatment well    Behavior During Therapy WFL for tasks assessed/performed                     Past Medical History:  Diagnosis Date   Arthritis    Asthma    childhood now returning   Back pain affecting pregnancy 11/02/2014   Benign essential tremor 10/28/2014   Bilateral dry eyes    Breast cancer ([place])    right   Cancer ([place])    Breast   Dry eyes    Encounter for Medicare annual wellness exam 05/22/2016   Essential hypertension, benign 10/28/2014   H/O measles    H/O mumps    History of chicken pox    Lumbago 11/02/2014   Mixed hyperlipidemia 10/28/2014   Obesity 10/28/2014   Stenosis of cervical spine    Past Surgical History:  Procedure Laterality Date   BACK SURGERY     BREAST REDUCTION SURGERY Left    CHOLECYSTECTOMY     EYE SURGERY  2008   b/l cataracts removed, in [place] Bilateral    ROTATOR CUFF REPAIR Left    Patient Active Problem List   Diagnosis Date Noted   Osteoporosis 05/25/2021   Scapular dysfunction 03/17/2021   Compression fracture of T11 vertebra ([place]) 01/20/2021   SI joint arthritis 01/20/2021   Vitamin D deficiency 01/20/2021   Age-related osteoporosis with current pathological fracture 11/26/2020   Pain of right upper extremity 11/11/2020   Bradycardia 11/11/2020   Neck pain 03/11/2020   Tremor 03/01/2020    Hypothyroid 03/01/2020   Physical deconditioning 11/25/2019   At risk for obstructive sleep apnea 11/25/2019   Primary osteoarthritis of right shoulder 02/28/2019   Other forms of dyspnea 11/22/2018   Educated about COVID-19 virus infection 07/03/2018   Greater trochanteric pain syndrome of right lower extremity 03/18/2018   Chronic nonallergic rhinitis 12/20/2017   Cough 12/20/2017   Right flank pain 12/10/2017   Fatigue 07/12/2017   Morbid obesity ([place]) 03/05/2017   Chronic diastolic heart failure ([place]) 11/29/2016   Dyspnea on exertion 10/06/2016   Asthma 10/06/2016   Encounter for Medicare annual wellness exam 05/22/2016   Hyperglycemia 08/07/2015   Allergic 08/07/2015   Gout 08/07/2015   Leukocytosis 08/07/2015   Lower abdominal pain 04/21/2015   Low back pain 11/02/2014   Esophageal reflux 10/28/2014   Essential hypertension, benign 10/28/2014   Mixed hyperlipidemia 10/28/2014   Benign essential tremor 10/28/2014   Bilateral dry eyes    Moderate persistent asthma    Arthritis    Cancer ([place])    History of chicken pox     PCP:  [name], MD  REFERRING PROVIDER:  Kary Kos, MD  REFERRING DIAG:  M54.12 (ICD-10-CM) - Radiculopathy, cervical region  THERAPY DIAG:  Radiculopathy, cervical region  Cervicalgia  Abnormal posture  Muscle weakness (generalized)  Other muscle spasm  Other symptoms and signs involving the musculoskeletal system  RATIONALE FOR EVALUATION AND TREATMENT:  Rehabilitation  ONSET DATE:  3-4 months  SUBJECTIVE:                                                                                                                                                                                                         SUBJECTIVE STATEMENT: Pt reports she has doing really well. The only problem she notes is occasional sleep disturbance.  PAIN:  Are you having pain? No  PERTINENT HISTORY:  Chronic LBP with h/o surgery for "ruptured  disc", lumbar spinal stenosis, OA, B TKA, L RTC repair, chronic diastolic HF, HLD, moderate asthma, DOE, HTN, breast cancer, benign essential tremor, morbid obesity   PRECAUTIONS: Fall  WEIGHT BEARING RESTRICTIONS No  FALLS:  Has patient fallen in last 6 months? Yes. Number of falls 1  LIVING ENVIRONMENT: Lives with: lives with their spouse Lives in: House/apartment Stairs: Yes: External: 1 steps; over threshold Has following equipment at home: Single point cane, Environmental consultant - 2 wheeled, Environmental consultant - 4 wheeled, Wheelchair (manual), shower chair, and bed side commode  OCCUPATION: Retired  PLOF: Independent and Leisure: going on cruises, movies, playing cards with friends, has not been swimming like she used to  PATIENT GOALS: "To be able to move my neck w/o pain."  OBJECTIVE:   DIAGNOSTIC FINDINGS:   Cervical MRI - 07/07/21: 1. Degenerative changes of the cervical spine with moderate spinal canal stenosis at C5-6 and C6-7 with mild mass effect on the cord at these levels without cord signal abnormality.  2. Multilevel high-grade neural foraminal narrowing, as described above, severe bilaterally at C5-6.  PATIENT SURVEYS:  FOTO Neck = 57; predicted = 65  COGNITION: Overall cognitive status: Within functional limits for tasks assessed  SENSATION: WFL  POSTURE:  rounded shoulders, forward head, and increased thoracic kyphosis  PALPATION: Increased muscle tension bilaterally with TTP R>L in B cervical paraspinals, UT, LS teres group, lats, deltoids and pecs   CERVICAL ROM:   Active ROM AROM (deg) Eval 08/17/21    10/05/21  Flexion 47 45  Extension 38 48  Right lateral flexion 12 21  Left lateral flexion 19 20  Right rotation 52 48  Left rotation 47 45   (Blank rows = not tested)  UPPER EXTREMITY ROM:  Active ROM Right Eval 08/17/21 Left Eval 08/17/21  Right 10/05/21  Left 10/05/21  Shoulder flexion 91 109  104 122  Shoulder extension 27 37 35 44  Shoulder abduction 89  88 90 109  Shoulder adduction      Shoulder internal rotation      Shoulder external rotation 30 48 56 65  Elbow flexion      Elbow extension      Wrist flexion      Wrist extension      Wrist ulnar deviation      Wrist radial deviation      Wrist pronation      Wrist supination       (Blank rows = not tested)  UPPER EXTREMITY MMT: (for avaialble ROM)  MMT Right Eval 08/17/21 Left Eval 08/17/21  Right 10/05/21  Left 10/05/21  Shoulder flexion 3+ 4- 4 4  Shoulder extension 4- 4 4+ 4+  Shoulder abduction 3+ 4- 4- 4  Shoulder adduction      Shoulder internal rotation 4 4 4+ 4+  Shoulder external rotation 3+ 3+ 4- 4  Middle trapezius      Lower trapezius      Elbow flexion      Elbow extension      Wrist flexion      Wrist extension      Wrist ulnar deviation      Wrist radial deviation      Wrist pronation      Wrist supination      Grip strength       (Blank rows = not tested)  CERVICAL SPECIAL TESTS:  Spurling's test: Positive and Distraction test: Positive   TODAY'S TREATMENT:   10/12/21 THERAPEUTIC EXERCISE: to improve flexibility, strength and mobility.  Verbal and tactile cues throughout for technique. UBE - L1.0 x 6 min (3 min each fwd & back)  THERAPEUTIC ACTIVITIES: Neck FOTO = 66 Goal assessment   10/07/21 Therapeutic Exercise: Nustep L3x50mn Seated bil ER with yellow TB x 10 Seated shoulder flexion with 1lb weights x 10 Seated horizontal ABD yellow TB x 10 Standing row red TB 2x10 Standing chop and reverse chop with red TB x 10 each   10/05/21 THERAPEUTIC EXERCISE: to improve flexibility, strength and mobility.  Verbal and tactile cues throughout for technique.  UBE - L1.5 x 6 min (3 min each fwd & back)  Seated chin tuck with 2 finger guidance 10 x 5" Seated scap retraction 10 x 5", 2 sets (MT to address TP in R anterior deltoid between sets) - better tolerance for 2nd set Seated RTB rows 10 x 5" - emphasis on scap retraction & depression   Seated RTB scap retraction + shoulder extension to neutral 10 x 5"  Seated B cervical rotation SNAGs with pillowcase x 10 each direction  THERAPEUTIC ACTIVITIES: ROM & MMT assessment Goal Assessment  MANUAL THERAPY: To promote normalized muscle tension, improved flexibility, increased ROM, and reduced pain. STM/DTM and manual TPR to R anterior deltoids   PATIENT EDUCATION:  Education details: recommended frequency for ongoing HEP at discharge to prevent loss of gains achieved with PT  Person educated: Patient Education method: Explanation Education comprehension: verbalized understanding   HOME EXERCISE PROGRAM: Access Code: [email or web address] URL: [email or web address] Date: 10/07/2021 Prepared by: BClarene Essex Exercises - Doorway Pec Stretch at 60 Degrees Abduction with Arm Straight  - 2-3 x daily - 7 x weekly - 3 reps - 30 sec hold - Standing Bilateral Low Shoulder Row with Anchored Resistance  - 1 x daily - 3 x weekly - 2 sets - 10 reps - 5  sec hold - Scapular Retraction with Resistance Advanced  - 1 x daily - 3 x weekly - 2 sets - 10 reps - 5 sec hold - Shoulder External Rotation and Scapular Retraction with Resistance  - 1 x daily - 3 x weekly - 3 sets - 10 reps - Standing Shoulder Flexion to 90 Degrees with Dumbbells  - 1 x daily - 3 x weekly - 3 sets - 10 reps - Seated Gentle Upper Trapezius Stretch  - 2-3 x daily - 7 x weekly - 3 reps - 30 sec hold - Gentle Levator Scapulae Stretch  - 2-3 x daily - 7 x weekly - 3 reps - 30 sec hold - Seated Assisted Cervical Rotation with Towel  - 1 x daily - 7 x weekly - 2 sets - 10 reps - 3 sec hold - Mid-Lower Cervical Extension SNAG with Strap  - 1 x daily - 7 x weekly - 2 sets - 10 reps - 3 sec hold  Patient Education - Trigger Point Dry Needling  ASSESSMENT:  CLINICAL IMPRESSION: Kaylor "[name]" reports 95% improvement in her neck pain and UE radiculopathy, only experiencing occasional discomfort with sleeping or end ROM in  R cervical rotation. Her FOTO has improved to 66, exceeding the predicted value of 65. She reports good understanding of good posture and her current HEP, denying need for review of either. Cervical and UE ROM and strength WFL with exceptions of long-standing premorbid limitations in R shoulder. All goals now met and [name] feels confident with plan to transition to her HEP at this time and proceed with discharge from PT.  [place] decreased activity tolerance, decreased knowledge of condition, decreased mobility, difficulty walking, decreased ROM, decreased strength, increased fascial restrictions, increased muscle spasms, impaired flexibility, impaired UE functional use, improper body mechanics, postural dysfunction, and pain.   ACTIVITY LIMITATIONS carrying, lifting, reach over head, and turning head  PARTICIPATION LIMITATIONS: meal prep, cleaning, laundry, and community activity  PERSONAL FACTORS Age, Fitness, Past/current experiences, Time since onset of injury/illness/exacerbation, Transportation, and 3+ comorbidities: Chronic LBP with h/o surgery for "ruptured disc", lumbar spinal stenosis, OA, B TKA, L RTC repair, chronic diastolic HF, HLD, moderate asthma, DOE, HTN, breast cancer, benign essential tremor, morbid obesity   are also affecting patient's functional outcome.   REHAB POTENTIAL: Good  CLINICAL DECISION MAKING: Evolving/moderate complexity  EVALUATION COMPLEXITY: Moderate   GOALS: Goals reviewed with patient? Yes  SHORT TERM GOALS: Target date: 09/07/2021   Patient will be independent with initial HEP.  Baseline:  Goal status: MET  08/24/21  2.  Patient to report reduction in frequency and intensity of R-sided neck and radicular UE pain by >/= 25%  Baseline:  Goal status: MET  08/31/21 - Pt reports 50-60% reduction in frequency of pain and 20% reduction in intensity  LONG TERM GOALS: Target date: 10/12/2021   Patient will be independent with advanced/ongoing HEP  to improve outcomes and carryover.  Baseline:  Goal status: MET  10/12/21  2.  Patient will report 50-75% improvement in neck pain to improve QOL.  Baseline:  Goal status: MET  10/05/21 - Pt reports 75% improvement in pain; 95% improvement as of 10/13/51  3.  Patient will demonstrate functional pain free cervical ROM for improved ease of interactions with friends.  Baseline:  Goal status: MET  10/12/21  4.  Patient will demonstrate improved posture to decrease muscle imbalance. Baseline:  Goal status: MET  10/12/21  5.  Patient will report 37 on  neck FOTO to demonstrate improved functional ability.  Baseline: 57 Goal status: MET  10/12/21 - FOTO = 66   PLAN: PT FREQUENCY: 2x/week  PT DURATION: 8 weeks  (gap from 09/10/21 - 09/25/21 while pt traveling on vacation/cruise)  PLANNED INTERVENTIONS: Therapeutic exercises, Therapeutic activity, Neuromuscular re-education, Balance training, Gait training, Patient/Family education, Joint mobilization, Dry Needling, Electrical stimulation, Spinal mobilization, Cryotherapy, Moist heat, Taping, Traction, Ultrasound, Ionotophoresis 37m/ml Dexamethasone, Manual therapy, and Re-evaluation  PLAN FOR NEXT SESSION: transition to HEP + discharge from PT    [place] Visits from Start of Care: 10  Current functional level related to goals / functional outcomes:   Refer to above clinical impression and goal assessment.   Remaining deficits:   Occasional discomfort while sleeping or with end ROM R cervical rotation.   Education / Equipment:   HEP; pBiomedical scientisteducation   Patient agrees to discharge. Patient goals were met. Patient is being discharged due to meeting the stated rehab goals.   JPercival Spanish PT 10/12/2021, 2:48 PM

## 2021-11-02 DIAGNOSIS — H43813 Vitreous degeneration, bilateral: Secondary | ICD-10-CM | POA: Diagnosis not present

## 2021-11-02 DIAGNOSIS — H35453 Secondary pigmentary degeneration, bilateral: Secondary | ICD-10-CM | POA: Diagnosis not present

## 2021-11-02 DIAGNOSIS — Z961 Presence of intraocular lens: Secondary | ICD-10-CM | POA: Diagnosis not present

## 2021-11-02 DIAGNOSIS — H353132 Nonexudative age-related macular degeneration, bilateral, intermediate dry stage: Secondary | ICD-10-CM | POA: Diagnosis not present

## 2021-11-02 DIAGNOSIS — H04123 Dry eye syndrome of bilateral lacrimal glands: Secondary | ICD-10-CM | POA: Diagnosis not present

## 2021-11-02 DIAGNOSIS — H35363 Drusen (degenerative) of macula, bilateral: Secondary | ICD-10-CM | POA: Diagnosis not present

## 2021-11-08 DIAGNOSIS — M5412 Radiculopathy, cervical region: Secondary | ICD-10-CM | POA: Diagnosis not present

## 2021-11-11 ENCOUNTER — Telehealth: Payer: Self-pay | Admitting: *Deleted

## 2021-11-11 NOTE — Telephone Encounter (Signed)
   Pre-operative Risk Assessment    Patient Name: Amanda Copeland  DOB: Oct 17, 1940 MRN: [id number]     Request for Surgical Clearance    Procedure:  Dental Extraction - Amount of Teeth to be Pulled:  1 TOOTH TO BE EXTRACTED  Date of Surgery:  Clearance TBD                                 Surgeon:  DR. Malachy Chamber, DDS Surgeon's Group or Practice Name:  DR. Malachy Chamber, DDS Phone number:  [phone] Fax number:  [phone]   Type of Clearance Requested:   - Medical ; DOES ASA NEED TO BE HELD 3-5 DAYS PRIOR TO PROCEDURE?    Type of Anesthesia:  Local    Additional requests/questions:    Jiles Prows   11/11/2021, 12:20 PM

## 2021-11-11 NOTE — Telephone Encounter (Signed)
    Primary Cardiologist: Larae Grooms, MD  Chart reviewed as part of pre-operative protocol coverage. Simple dental extractions are considered low risk procedures per guidelines and generally do not require any specific cardiac clearance. It is also generally accepted that for simple extractions and dental cleanings, there is no need to interrupt blood thinner therapy.   SBE prophylaxis is not required for the patient.  I will route this recommendation to the requesting party via Epic fax function and remove from pre-op pool.  Please call with questions.  Deberah Pelton, NP 11/11/2021, 12:39 PM

## 2021-11-16 DIAGNOSIS — M542 Cervicalgia: Secondary | ICD-10-CM | POA: Diagnosis not present

## 2021-11-24 NOTE — Assessment & Plan Note (Signed)
Encouraged to get adequate exercise, calcium and vitamin d intake 

## 2021-11-24 NOTE — Assessment & Plan Note (Signed)
Hydrate and monitor 

## 2021-11-24 NOTE — Assessment & Plan Note (Signed)
No recent exacerbation, no changes 

## 2021-11-24 NOTE — Assessment & Plan Note (Signed)
Well controlled, no changes to meds. Encouraged heart healthy diet such as the DASH diet and exercise as tolerated.  °

## 2021-11-24 NOTE — Assessment & Plan Note (Signed)
On Levothyroxine, continue to monitor 

## 2021-11-24 NOTE — Assessment & Plan Note (Signed)
hgba1c acceptable, minimize simple carbs. Increase exercise as tolerated.  

## 2021-11-24 NOTE — Assessment & Plan Note (Signed)
Has been working with physical therapy

## 2021-11-24 NOTE — Assessment & Plan Note (Signed)
Supplement and monitor 

## 2021-11-24 NOTE — Assessment & Plan Note (Signed)
No complaints of pain.

## 2021-11-25 ENCOUNTER — Ambulatory Visit: Payer: Medicare Other | Admitting: Family Medicine

## 2021-11-25 ENCOUNTER — Ambulatory Visit (INDEPENDENT_AMBULATORY_CARE_PROVIDER_SITE_OTHER): Payer: Medicare Other | Admitting: Family Medicine

## 2021-11-25 VITALS — BP 132/78 | HR 72 | Temp 98.0°F | Resp 16 | Ht 61.0 in | Wt 235.8 lb

## 2021-11-25 DIAGNOSIS — R739 Hyperglycemia, unspecified: Secondary | ICD-10-CM | POA: Diagnosis not present

## 2021-11-25 DIAGNOSIS — M810 Age-related osteoporosis without current pathological fracture: Secondary | ICD-10-CM | POA: Diagnosis not present

## 2021-11-25 DIAGNOSIS — E559 Vitamin D deficiency, unspecified: Secondary | ICD-10-CM

## 2021-11-25 DIAGNOSIS — R059 Cough, unspecified: Secondary | ICD-10-CM | POA: Diagnosis not present

## 2021-11-25 DIAGNOSIS — E785 Hyperlipidemia, unspecified: Secondary | ICD-10-CM

## 2021-11-25 DIAGNOSIS — I10 Essential (primary) hypertension: Secondary | ICD-10-CM

## 2021-11-25 DIAGNOSIS — M109 Gout, unspecified: Secondary | ICD-10-CM

## 2021-11-25 DIAGNOSIS — E039 Hypothyroidism, unspecified: Secondary | ICD-10-CM

## 2021-11-25 DIAGNOSIS — R5381 Other malaise: Secondary | ICD-10-CM | POA: Diagnosis not present

## 2021-11-25 DIAGNOSIS — S22080A Wedge compression fracture of T11-T12 vertebra, initial encounter for closed fracture: Secondary | ICD-10-CM

## 2021-11-25 DIAGNOSIS — I5032 Chronic diastolic (congestive) heart failure: Secondary | ICD-10-CM

## 2021-11-25 MED ORDER — LORATADINE 10 MG PO TABS: 10.0000 mg | ORAL_TABLET | Freq: Every day | ORAL | 5 refills | Status: DC

## 2021-11-25 MED ORDER — FAMOTIDINE 40 MG PO TABS: 40.0000 mg | ORAL_TABLET | Freq: Every day | ORAL | 5 refills | Status: DC

## 2021-11-25 NOTE — Progress Notes (Signed)
Subjective:   By signing my name below, I, Amanda Copeland, attest that this documentation has been prepared under the direction and in the presence of Amanda Copeland A, MD.,11/25/2021.    Patient ID: Amanda Copeland, female    DOB: November 07, 1940, 81 y.o.   MRN: [id number]  Chief Complaint  Patient presents with   Follow-up    Here for follow up   HPI Patient is in today for an office visit.  Arthritis: She states that she has arthritis in the top of her neck and base of her skull and is inquiring about remedies to manage this. She denies having recent falls.   Coughing: She reports that she experiences intermittent episodes of coughing. She says that once in a while, these episodes cause her to lose her breath. She confirms getting winded upon exertion but denies heartburn, which she takes Prevacid 30 mg to manage.  Dental: She states that recently had a molar removed.  Immunizations: She has been informed about receiving the RSV immunization. She is up to date on COVID-19 and high-dose Flu immunizations.  Past Medical History:  Diagnosis Date   Arthritis    Asthma    childhood now returning   Back pain affecting pregnancy 11/02/2014   Benign essential tremor 10/28/2014   Bilateral dry eyes    Breast cancer ([place])    right   Cancer ([place])    Breast   Dry eyes    Encounter for Medicare annual wellness exam 05/22/2016   Essential hypertension, benign 10/28/2014   H/O measles    H/O mumps    History of chicken pox    Lumbago 11/02/2014   Mixed hyperlipidemia 10/28/2014   Obesity 10/28/2014   Stenosis of cervical spine    Past Surgical History:  Procedure Laterality Date   BACK SURGERY     BREAST REDUCTION SURGERY Left    CHOLECYSTECTOMY     EYE SURGERY  2008   b/l cataracts removed, in [place] Right    REDUCTION MAMMAPLASTY     REPLACEMENT TOTAL KNEE BILATERAL Bilateral    ROTATOR CUFF REPAIR Left    Family History  Problem Relation Age of Onset   Heart  disease Mother    Heart attack Mother    Heart failure Mother    Hypertension Mother    Cancer Father        colon   Asthma Father    Parkinson's disease Father    Cancer Maternal Grandmother        uterine   Heart disease Maternal Grandfather    Obesity Daughter    Appendicitis Paternal Grandmother    Stroke Neg Hx    Social History   Socioeconomic History   Marital status: Married    Spouse name: Not on file   Number of children: 1   Years of education: Not on file   Highest education level: Master's degree (e.g., MA, MS, MEng, MEd, MSW, MBA)  Occupational History   Occupation: retired    Comment: Pharmacist, 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)  Tobacco Use   Smoking status: Never   Smokeless tobacco: Never  Vaping Use   Vaping Use: Never used  Substance and Sexual Activity   Alcohol use: Yes    Alcohol/week: 0.0 standard drinks of alcohol    Comment: two times a month   Drug use: No   Sexual activity: Not on file    Comment: lives with husband, moved [place], no dietary restrictions, retired Education officer, museum  Other Topics Concern   Not on file  Social History Narrative   Pt lives with Spouse at home 1 daughter   Drinks coffee, and soda   Right handed   Social Determinants of Health   Financial Resource Strain: Not on file  Food Insecurity: Not on file  Transportation Needs: Not on file  Physical Activity: Not on file  Stress: Not on file  Social Connections: Not on file  Intimate Partner Violence: Not on file   Outpatient Medications Prior to Visit  Medication Sig Dispense Refill   albuterol (PROVENTIL HFA;VENTOLIN HFA) 108 (90 Base) MCG/ACT inhaler INHALE 2 PUFFS INTO THE LUNGS EVERY 4 (FOUR) HOURS AS NEEDED FOR WHEEZING OR SHORTNESS OF BREATH. 6.7 Inhaler 1   allopurinol (ZYLOPRIM) 100 MG tablet TAKE 2 TABLETS BY MOUTH EVERY DAY 180 tablet 1   AMBULATORY NON FORMULARY MEDICATION Rollator  Dx: Gait instability 1 Device 0   amLODipine (NORVASC) 5 MG tablet TAKE 1 TABLET (5 MG TOTAL)  BY MOUTH DAILY. 90 tablet 3   Ascorbic Acid (VITAMIN C PO) Take 1 tablet by mouth daily.      aspirin 81 MG tablet Take 81 mg by mouth daily.     colchicine 0.6 MG tablet Take 2 tabs at first and then 1 tab q 2 hours til pain relief or max of 6 tabs in 24 hours (Patient taking differently: Take 2 tabs at first and then 1 tab q 2 hours til pain relief or max of 6 tabs in 24 hours - PRN for gout flare-up) 6 tablet 1   doxycycline (VIBRA-TABS) 100 MG tablet Take 1 tablet (100 mg total) by mouth 2 (two) times daily. 20 tablet 0   DYMISTA 137-50 MCG/ACT SUSP 1-2 SPRAYS PER NOSTRIL TWICE DAILY FOR RUNNY NOSE 69 g 1   ezetimibe (ZETIA) 10 MG tablet TAKE 1 TABLET BY MOUTH EVERY DAY 90 tablet 1   fluticasone (FLOVENT HFA) 110 MCG/ACT inhaler 2 PUFFS TWICE DAILY TO PREVENT COUGHING OR WHEEZING. MAX INS WILL COVER IS A 30 DAY 36 each 1   furosemide (LASIX) 40 MG tablet TAKE 1 TABLET BY MOUTH EVERY DAY 90 tablet 3   KLOR-CON M20 20 MEQ tablet TAKE 1 TABLET BY MOUTH DAILY. KEEP UPCOMING APPOINTMENT 90 tablet 3   levothyroxine (SYNTHROID) 112 MCG tablet TAKE 1 TABLET BY MOUTH EVERY DAY BEFORE BREAKFAST 90 tablet 1   Multiple Vitamins-Minerals (ICAPS AREDS 2) CAPS Take 1 capsule by mouth 2 (two) times daily.     PREVACID 30 MG capsule TAKE 1 CAPSULE BY MOUTH TWICE A DAY --MAX LIMIT 180 capsule 1   promethazine-dextromethorphan (PROMETHAZINE-DM) 6.25-15 MG/5ML syrup Take 2.5-5 mLs by mouth 3 (three) times daily as needed for cough. 160 mL 0   propranolol (INDERAL) 40 MG tablet TAKE 1 TABLET BY MOUTH TWICE A DAY 180 tablet 1   Red Yeast Rice Extract (RED YEAST RICE PO) Take by mouth daily.     Vitamin D, Ergocalciferol, (DRISDOL) 1.25 MG (50000 UNIT) CAPS capsule Take 1 capsule (50,000 Units total) by mouth every 7 (seven) days. 4 capsule 4   No facility-administered medications prior to visit.   Allergies  Allergen Reactions   Amoxicillin Rash   Ampicillin Rash   Penicillins Rash   Statins Other (See  Comments)    Per reports muscle aches and joint pain Per reports muscle aches and joint pain   Review of Systems  Gastrointestinal:  Negative for heartburn.  Musculoskeletal:  Negative for falls.  Objective:    Physical Exam Constitutional:      General: She is not in acute distress.    Appearance: Normal appearance. She is not ill-appearing.  HENT:     Head: Normocephalic and atraumatic.     Right Ear: External ear normal.     Left Ear: External ear normal.     Mouth/Throat:     Mouth: Mucous membranes are moist.     Pharynx: Oropharynx is clear.  Eyes:     Extraocular Movements: Extraocular movements intact.     Pupils: Pupils are equal, round, and reactive to light.  Cardiovascular:     Rate and Rhythm: Normal rate and regular rhythm.     Pulses: Normal pulses.     Heart sounds: Normal heart sounds. No murmur heard.    No gallop.  Pulmonary:     Effort: Pulmonary effort is normal. No respiratory distress.     Breath sounds: Normal breath sounds. No wheezing or rales.  Abdominal:     General: Bowel sounds are normal.  Skin:    General: Skin is warm and dry.  Neurological:     Mental Status: She is alert and oriented to person, place, and time.  Psychiatric:        Mood and Affect: Mood normal.        Behavior: Behavior normal.        Judgment: Judgment normal.    BP 132/78 (BP Location: Right Arm, Patient Position: Sitting, Cuff Size: Normal)   Pulse 72   Temp 98 F (36.7 C) (Oral)   Resp 16   Ht '5\' 1"'$  (1.549 m)   Wt 235 lb 12.8 oz (107 kg)   SpO2 95%   BMI 44.55 kg/m  Wt Readings from Last 3 Encounters:  11/25/21 235 lb 12.8 oz (107 kg)  05/25/21 237 lb (107.5 kg)  04/12/21 228 lb 12.8 oz (103.8 kg)   Diabetic Foot Exam - Simple   No data filed    Lab Results  Component Value Date   WBC 9.0 11/25/2021   HGB 12.8 11/25/2021   HCT 39.8 11/25/2021   PLT 271.0 11/25/2021   GLUCOSE 103 (H) 11/25/2021   CHOL 177 11/25/2021   TRIG 207.0 (H)  11/25/2021   HDL 44.30 11/25/2021   LDLDIRECT 106.0 11/25/2021   LDLCALC 118 (H) 05/25/2021   ALT 9 11/25/2021   AST 16 11/25/2021   NA 143 11/25/2021   K 4.0 11/25/2021   CL 103 11/25/2021   CREATININE 0.83 11/25/2021   BUN 14 11/25/2021   CO2 27 11/25/2021   TSH 2.07 11/25/2021   HGBA1C 5.8 11/25/2021   Lab Results  Component Value Date   TSH 2.07 11/25/2021   Lab Results  Component Value Date   WBC 9.0 11/25/2021   HGB 12.8 11/25/2021   HCT 39.8 11/25/2021   MCV 96.6 11/25/2021   PLT 271.0 11/25/2021   Lab Results  Component Value Date   NA 143 11/25/2021   K 4.0 11/25/2021   CO2 27 11/25/2021   GLUCOSE 103 (H) 11/25/2021   BUN 14 11/25/2021   CREATININE 0.83 11/25/2021   BILITOT 0.4 11/25/2021   ALKPHOS 71 11/25/2021   AST 16 11/25/2021   ALT 9 11/25/2021   PROT 6.1 11/25/2021   ALBUMIN 3.7 11/25/2021   CALCIUM 9.2 11/25/2021   GFR 65.87 11/25/2021   Lab Results  Component Value Date   CHOL 177 11/25/2021   Lab Results  Component Value Date  HDL 44.30 11/25/2021   Lab Results  Component Value Date   LDLCALC 118 (H) 05/25/2021   Lab Results  Component Value Date   TRIG 207.0 (H) 11/25/2021   Lab Results  Component Value Date   CHOLHDL 4 11/25/2021   Lab Results  Component Value Date   HGBA1C 5.8 11/25/2021      Assessment & Plan:   Problem List Items Addressed This Visit     Essential hypertension, benign    Well controlled, no changes to meds. Encouraged heart healthy diet such as the DASH diet and exercise as tolerated.       Relevant Orders   CBC (Completed)   Comprehensive metabolic panel (Completed)   TSH (Completed)   Hyperlipidemia - Primary   Relevant Orders   Lipid panel (Completed)   Hyperglycemia    hgba1c acceptable, minimize simple carbs. Increase exercise as tolerated.       Relevant Orders   Hemoglobin A1c (Completed)   Gout    Hydrate and monitor      Relevant Orders   Hemoglobin A1c (Completed)   Uric  acid (Completed)   Chronic diastolic heart failure (HCC)    No recent exacerbation, no changes      Cough    Has been flared recently. She will start Famotidine 40 mg po qhs and Loratadine 10 mg daily and she will let us know if her cough worsens so we can refer for further work up      Physical deconditioning    Has been working with physical therapy      Hypothyroid    On Levothyroxine, continue to monitor      Compression fracture of T11 vertebra (HCC)    No complaints of pain       Vitamin D deficiency    Supplement and monitor      Relevant Orders   VITAMIN D 25 Hydroxy (Vit-D Deficiency, Fractures) (Completed)   Osteoporosis    Encouraged to get adequate exercise, calcium and vitamin d intake      Meds ordered this encounter  Medications   famotidine (PEPCID) 40 MG tablet    Sig: Take 1 tablet (40 mg total) by mouth at bedtime.    Dispense:  30 tablet    Refill:  5   loratadine (CLARITIN) 10 MG tablet    Sig: Take 1 tablet (10 mg total) by mouth daily.    Dispense:  30 tablet    Refill:  5   I, Amanda Homans, MD, personally preformed the services described in this documentation.  All medical record entries made by the scribe were at my direction and in my presence.  I have reviewed the chart and discharge instructions (if applicable) and agree that the record reflects my personal performance and is accurate and complete. 11/25/2021  I,Mohammed Iqbal,acting as a scribe for Amanda Homans, MD.,have documented all relevant documentation on the behalf of Amanda Homans, MD,as directed by  Amanda Homans, MD while in the presence of Amanda Homans, MD.  Amanda Homans, MD

## 2021-11-25 NOTE — Patient Instructions (Addendum)
RSV (respiratory syncitial virus) vaccine at pharmacy, [facility]

## 2021-11-26 LAB — CBC
HCT: 39.8 % (ref 36.0–46.0)
Hemoglobin: 12.8 g/dL (ref 12.0–15.0)
MCHC: 32.1 g/dL (ref 30.0–36.0)
MCV: 96.6 fl (ref 78.0–100.0)
Platelets: 271 10*3/uL (ref 150.0–400.0)
RBC: 4.12 Mil/uL (ref 3.87–5.11)
RDW: 16 % — ABNORMAL HIGH (ref 11.5–15.5)
WBC: 9 10*3/uL (ref 4.0–10.5)

## 2021-11-26 LAB — COMPREHENSIVE METABOLIC PANEL
ALT: 9 U/L (ref 0–35)
AST: 16 U/L (ref 0–37)
Albumin: 3.7 g/dL (ref 3.5–5.2)
Alkaline Phosphatase: 71 U/L (ref 39–117)
BUN: 14 mg/dL (ref 6–23)
CO2: 27 mEq/L (ref 19–32)
Calcium: 9.2 mg/dL (ref 8.4–10.5)
Chloride: 103 mEq/L (ref 96–112)
Creatinine, Ser: 0.83 mg/dL (ref 0.40–1.20)
GFR: 65.87 mL/min (ref >60.00)
Glucose, Bld: 103 mg/dL — ABNORMAL HIGH (ref 70–99)
Potassium: 4 mEq/L (ref 3.5–5.1)
Sodium: 143 mEq/L (ref 135–145)
Total Bilirubin: 0.4 mg/dL (ref 0.2–1.2)
Total Protein: 6.1 g/dL (ref 6.0–8.3)

## 2021-11-26 LAB — LIPID PANEL
Cholesterol: 177 mg/dL (ref 0–200)
HDL: 44.3 mg/dL (ref >39.00)
NonHDL: 133
Total CHOL/HDL Ratio: 4
Triglycerides: 207 mg/dL — ABNORMAL HIGH (ref 0.0–149.0)
VLDL: 41.4 mg/dL — ABNORMAL HIGH (ref 0.0–40.0)

## 2021-11-26 LAB — LDL CHOLESTEROL, DIRECT: Direct LDL: 106 mg/dL

## 2021-11-26 LAB — TSH: TSH: 2.07 u[IU]/mL (ref 0.35–5.50)

## 2021-11-26 LAB — HEMOGLOBIN A1C: Hgb A1c MFr Bld: 5.8 % (ref 4.6–6.5)

## 2021-11-26 LAB — URIC ACID: Uric Acid, Serum: 4.1 mg/dL (ref 2.4–7.0)

## 2021-11-26 LAB — VITAMIN D 25 HYDROXY (VIT D DEFICIENCY, FRACTURES): VITD: 28.34 ng/mL — ABNORMAL LOW (ref 30.00–100.00)

## 2021-11-26 NOTE — Assessment & Plan Note (Signed)
Has been flared recently. She will start Famotidine 40 mg po qhs and Loratadine 10 mg daily and she will let us know if her cough worsens so we can refer for further work up

## 2021-11-30 NOTE — Therapy (Signed)
OUTPATIENT PHYSICAL THERAPY CERVICAL EVALUATION   Patient Name: Amanda Copeland MRN: [id number] DOB:February 06, 1941, 81 y.o., female Today's Date: 12/01/2021   PT End of Session - 12/01/21 0933     Visit Number 1    Date for PT Re-Evaluation 01/12/22    Authorization Type Medicare & BCBS    Progress Note Due on Visit 10    PT Start Time 0933    PT Stop Time 1016    PT Time Calculation (min) 43 min    Activity Tolerance Patient tolerated treatment well    Behavior During Therapy [facility] for tasks assessed/performed             Past Medical History:  Diagnosis Date   Arthritis    Asthma    childhood now returning   Back pain affecting pregnancy 11/02/2014   Benign essential tremor 10/28/2014   Bilateral dry eyes    Breast cancer ([place])    right   Cancer ([place])    Breast   Dry eyes    Encounter for Medicare annual wellness exam 05/22/2016   Essential hypertension, benign 10/28/2014   H/O measles    H/O mumps    History of chicken pox    Lumbago 11/02/2014   Mixed hyperlipidemia 10/28/2014   Obesity 10/28/2014   Stenosis of cervical spine    Past Surgical History:  Procedure Laterality Date   BACK SURGERY     BREAST REDUCTION SURGERY Left    CHOLECYSTECTOMY     EYE SURGERY  2008   b/l cataracts removed, in [place] Bilateral    ROTATOR CUFF REPAIR Left    Patient Active Problem List   Diagnosis Date Noted   Osteoporosis 05/25/2021   Scapular dysfunction 03/17/2021   Compression fracture of T11 vertebra ([place]) 01/20/2021   SI joint arthritis 01/20/2021   Vitamin D deficiency 01/20/2021   Age-related osteoporosis with current pathological fracture 11/26/2020   Pain of right upper extremity 11/11/2020   Bradycardia 11/11/2020   Neck pain 03/11/2020   Tremor 03/01/2020   Hypothyroid 03/01/2020   Physical deconditioning 11/25/2019   At risk for obstructive sleep apnea 11/25/2019   Primary  osteoarthritis of right shoulder 02/28/2019   Other forms of dyspnea 11/22/2018   Educated about COVID-19 virus infection 07/03/2018   Greater trochanteric pain syndrome of right lower extremity 03/18/2018   Chronic nonallergic rhinitis 12/20/2017   Cough 12/20/2017   Right flank pain 12/10/2017   Fatigue 07/12/2017   Morbid obesity ([place]) 03/05/2017   Chronic diastolic heart failure ([place]) 11/29/2016   Dyspnea on exertion 10/06/2016   Asthma 10/06/2016   Encounter for Medicare annual wellness exam 05/22/2016   Hyperglycemia 08/07/2015   Allergic 08/07/2015   Gout 08/07/2015   Leukocytosis 08/07/2015   Lower abdominal pain 04/21/2015   Low back pain 11/02/2014   Esophageal reflux 10/28/2014   Essential hypertension, benign 10/28/2014   Hyperlipidemia 10/28/2014   Benign essential tremor 10/28/2014   Bilateral dry eyes    Moderate persistent asthma    Arthritis    Cancer ([place])    History of chicken pox     PCP: [name], MD  REFERRING PROVIDER: Kary Kos, MD  REFERRING DIAG: M54.2 (ICD-10-CM) - Cervicalgia  THERAPY DIAG:  Cervicalgia  Stiffness of right shoulder, not elsewhere classified  Muscle weakness (generalized)  Cramp and spasm  Rationale for Evaluation and Treatment Rehabilitation  ONSET DATE:  2 weeks ago  SUBJECTIVE:                                                                                                                                                                                                         SUBJECTIVE STATEMENT: Started having some issue at the base of my skull and it would shoot up to the top of my head. Three doctors have diagnosed me with arthritis. Had another injection recently and it's holding well. I do my neck exercises and it's doing pretty well. I get some pain when turning to the right (end range) I can feel it. My shoulder pops a lot. I have trouble pulling up my pants and reaching to the second shelf in the kitchen.  I also have trouble hanging items in my closet.  PERTINENT HISTORY:  Cervical radiculopathy R, Chronic LBP with h/o surgery for "ruptured disc", lumbar spinal stenosis, OA, B TKA, L RTC repair, chronic diastolic HF, HLD, moderate asthma, DOE, HTN, breast cancer, benign essential tremor, morbid obesity   PAIN:  Are you having pain? Yes: NPRS scale: 0-7 when turns head/10 Pain location: right cervical Pain description: pull Aggravating factors: turning head to right Relieving factors: rest  PRECAUTIONS: None  WEIGHT BEARING RESTRICTIONS: No  FALLS:  Has patient fallen in last 6 months? No  LIVING ENVIRONMENT: Lives with: lives with their spouse Lives in: House/apartment Stairs: Yes: External: 1 steps; over threshold Has following equipment at home: Single point cane, Walker - 2 wheeled, Environmental consultant - 4 wheeled, Wheelchair (manual), shower chair, and bed side commode  OCCUPATION: Retired  PLOF: Independent  PATIENT GOALS: to strengthen my neck, to improve arm function  OBJECTIVE:   DIAGNOSTIC FINDINGS:   Cervical MRI - 07/07/21: 1. Degenerative changes of the cervical spine with moderate spinal canal stenosis at C5-6 and C6-7 with mild mass effect on the cord at these levels without cord signal abnormality.  2. Multilevel high-grade neural foraminal narrowing, as described above, severe bilaterally at C5-6.  PATIENT SURVEYS:  NDI 13/50 = 26% disability  COGNITION: Overall cognitive status: Within functional limits for tasks assessed  SENSATION: WFL  POSTURE: rounded shoulders, forward head, and increased thoracic kyphosis  PALPATION: Bil suboccipitals R>L, R cervical and UT tenderness   CERVICAL ROM:   Active ROM A/PROM (deg) eval  Flexion full  Extension full  Right lateral flexion WFL  Left lateral flexion WFL  Right rotation 40*  Left rotation 50   (Blank rows = not tested)  UPPER EXTREMITY ROM:   ROM A/P Right eval Left  eval  Shoulder flexion 133/ 140    Shoulder extension WNL   Shoulder abduction 96/104   Shoulder internal rotation 60/72   Shoulder external rotation 36/47    (Blank rows = not tested)  UPPER EXTREMITY MMT:   MMT Right eval Left eval  Shoulder flexion 4 with audible popping 4+  Shoulder extension 4- 4-  Shoulder abduction 4+ 4+  Shoulder internal rotation 5 4+  Shoulder external rotation 4+ with audible popping 4-  Middle trapezius    Lower trapezius     (Blank rows = not tested)   CERVICAL MMT: 4+/5 to 5/5 except extension 4/5  CERVICAL SPECIAL TESTS:  Spurling's test: Negative and Distraction test: Negative Negative Michel Bickers and mild impingement sign R shoulder    TODAY'S TREATMENT:                                                                                                                      DATE:  12/01/21 See Pt Ed  PATIENT EDUCATION:  Education details: PT eval findings, anticipated POC, HEP update, HEP review, and goals  Person educated: Patient Education method: Explanation, Demonstration, Verbal cues, and Handouts Education comprehension: verbalized understanding and returned demonstration  HOME EXERCISE PROGRAM: Access Code: [email or web address] URL: [email or web address] Date: 12/01/2021 Prepared by: Almyra Free  Exercises - Doorway Pec Stretch at 60 Degrees Abduction with Arm Straight  - 2-3 x daily - 7 x weekly - 3 reps - 30 sec hold - Standing Bilateral Low Shoulder Row with Anchored Resistance  - 1 x daily - 3 x weekly - 2 sets - 10 reps - 5 sec hold - Scapular Retraction with Resistance Advanced  - 1 x daily - 3 x weekly - 2 sets - 10 reps - 5 sec hold - Shoulder External Rotation and Scapular Retraction with Resistance  - 1 x daily - 3 x weekly - 3 sets - 10 reps - Standing Shoulder Flexion to 90 Degrees with Dumbbells  - 1 x daily - 3 x weekly - 3 sets - 10 reps - Seated Gentle Upper Trapezius Stretch  - 2-3 x daily - 7 x weekly - 3 reps - 30 sec hold - Gentle Levator  Scapulae Stretch  - 2-3 x daily - 7 x weekly - 3 reps - 30 sec hold - Seated Assisted Cervical Rotation with Towel  - 1 x daily - 7 x weekly - 2 sets - 10 reps - 3 sec hold - Mid-Lower Cervical Extension SNAG with Strap  - 1 x daily - 7 x weekly - 2 sets - 10 reps - 3 sec hold - Supine Chin Tuck  - 1 x daily - 7 x weekly - 3 sets - 10 reps - Supine Shoulder Flexion Extension AAROM with Dowel  - 2 x daily - 7 x weekly - 2 sets - 10 reps - Seated Cervical Rotation AROM  - 2 x daily - 7 x weekly - 1 sets - 10 reps - 5 sec hold -  Standing Shoulder Extension with Dowel  - 2 x daily - 7 x weekly - 2 sets - 10 reps  ASSESSMENT:  CLINICAL IMPRESSION: [place]  is a 81 y.o. female who was seen today for physical therapy evaluation and treatment for cervicalgia. [name] was recently discharged for cervical radiculopathy and reports she is doing pretty well. She had a recent flare up in her neck in the suboccipital region, but that is better now too. Her main complaint is neck pain when turning R and weakness in her neck and right shoulder affecting her ability to don pants, hang clothes in her closet and reach to the second shelf in her kitchen. She has limitations in R cervical rotation and shoulder AROM in all planes but extension. She has weakness in her neck muscles particularly extenders and also in bil shoulders. She also has tightness and pain with palpation to her suboccipital muscles and right cspine. She will benefit from skilled PT to address these deficits.   OBJECTIVE IMPAIRMENTS: decreased ROM, decreased strength, increased muscle spasms, impaired flexibility, impaired UE functional use, postural dysfunction, obesity, and pain.   ACTIVITY LIMITATIONS: dressing and reach over head  PARTICIPATION LIMITATIONS:  OH ADLS  PERSONAL FACTORS: Age, Fitness, Past/current experiences, Time since onset of injury/illness/exacerbation, Transportation, and 3+ comorbidities: Chronic LBP with h/o surgery for  "ruptured disc", lumbar spinal stenosis, OA, B TKA, L RTC repair, chronic diastolic HF, HLD, moderate asthma, DOE, HTN, breast cancer, benign essential tremor, morbid obesity     are also affecting patient's functional outcome.   REHAB POTENTIAL: Good  CLINICAL DECISION MAKING: Stable/uncomplicated  EVALUATION COMPLEXITY: Low   GOALS: Goals reviewed with patient? Yes  SHORT TERM GOALS: Target date: 12/15/2021 (Remove Blue Hyperlink)  Patient will be independent with initial HEP.  Baseline:  Goal status: INITIAL    LONG TERM GOALS: Target date: 01/12/2022 (Remove Blue Hyperlink)  Patient will be independent with advanced/ongoing HEP to improve outcomes and carryover.  Baseline:  Goal status: INITIAL  2.  Patient will report 50-75% improvement in neck pain to improve QOL.  Baseline:  Goal status: INITIAL  3.  Patient will demonstrate improved R cervical ROM by at least 5 deg with minimal pain for improved ease of ADLS.  Baseline:  Goal status: INITIAL  4.  Patient will report <= 11% on NDI  to demonstrate improved functional ability.  Baseline: 26% Goal status: INITIAL  5.  Patient will be able to pull up her pants without difficulty. Baseline:  Goal status: INITIAL  6. Patient will be able to hang clothes using her R UE independently   Baseline:  Goal status: INITIAL   7. Patient will be able to reach into her second shelf cabinets without difficulty Baseline:  Goal status: INITIAL    PLAN:  PT FREQUENCY: 2x/week  PT DURATION: 6 weeks  PLANNED INTERVENTIONS: Therapeutic exercises, Therapeutic activity, Neuromuscular re-education, Patient/Family education, Self Care, Joint mobilization, Dry Needling, Electrical stimulation, Spinal mobilization, Cryotherapy, Moist heat, Taping, Traction, Ultrasound, Ionotophoresis '4mg'$ /ml Dexamethasone, Manual therapy, and Re-evaluation  PLAN FOR NEXT SESSION: Review and progress HEP for post neck, upper back and functional  shoulder strengthening, work on R cervical Rot and R shoulder ROM, manual/DN to suboccipitals and cspine, spinal mobs prn.  Clearance Chenault, PT 12/01/2021, 12:15 PM

## 2021-12-01 ENCOUNTER — Ambulatory Visit: Payer: Medicare Other | Attending: Neurosurgery | Admitting: Physical Therapy

## 2021-12-01 ENCOUNTER — Other Ambulatory Visit: Payer: Self-pay

## 2021-12-01 ENCOUNTER — Encounter: Payer: Self-pay | Admitting: Physical Therapy

## 2021-12-01 DIAGNOSIS — M542 Cervicalgia: Secondary | ICD-10-CM | POA: Diagnosis not present

## 2021-12-01 DIAGNOSIS — R29898 Other symptoms and signs involving the musculoskeletal system: Secondary | ICD-10-CM | POA: Insufficient documentation

## 2021-12-01 DIAGNOSIS — M25611 Stiffness of right shoulder, not elsewhere classified: Secondary | ICD-10-CM | POA: Insufficient documentation

## 2021-12-01 DIAGNOSIS — M5412 Radiculopathy, cervical region: Secondary | ICD-10-CM | POA: Diagnosis not present

## 2021-12-01 DIAGNOSIS — M62838 Other muscle spasm: Secondary | ICD-10-CM | POA: Insufficient documentation

## 2021-12-01 DIAGNOSIS — R252 Cramp and spasm: Secondary | ICD-10-CM | POA: Insufficient documentation

## 2021-12-01 DIAGNOSIS — R293 Abnormal posture: Secondary | ICD-10-CM | POA: Insufficient documentation

## 2021-12-01 DIAGNOSIS — M6281 Muscle weakness (generalized): Secondary | ICD-10-CM | POA: Diagnosis not present

## 2021-12-02 NOTE — Therapy (Signed)
OUTPATIENT PHYSICAL THERAPY TREATMENT   Patient Name: Amanda Copeland MRN: [id number] DOB:Dec 24, 1940, 81 y.o., female Today's Date: 12/06/2021   PT End of Session - 12/06/21 0932     Visit Number 2    Date for PT Re-Evaluation 01/12/22    Authorization Type Medicare & BCBS    Progress Note Due on Visit 10    PT Start Time 0932    PT Stop Time 1015    PT Time Calculation (min) 43 min    Activity Tolerance Patient tolerated treatment well    Behavior During Therapy [facility] for tasks assessed/performed              Past Medical History:  Diagnosis Date   Arthritis    Asthma    childhood now returning   Back pain affecting pregnancy 11/02/2014   Benign essential tremor 10/28/2014   Bilateral dry eyes    Breast cancer ([place])    right   Cancer ([place])    Breast   Dry eyes    Encounter for Medicare annual wellness exam 05/22/2016   Essential hypertension, benign 10/28/2014   H/O measles    H/O mumps    History of chicken pox    Lumbago 11/02/2014   Mixed hyperlipidemia 10/28/2014   Obesity 10/28/2014   Stenosis of cervical spine    Past Surgical History:  Procedure Laterality Date   BACK SURGERY     BREAST REDUCTION SURGERY Left    CHOLECYSTECTOMY     EYE SURGERY  2008   b/l cataracts removed, in [place] Bilateral    ROTATOR CUFF REPAIR Left    Patient Active Problem List   Diagnosis Date Noted   Osteoporosis 05/25/2021   Scapular dysfunction 03/17/2021   Compression fracture of T11 vertebra ([place]) 01/20/2021   SI joint arthritis 01/20/2021   Vitamin D deficiency 01/20/2021   Age-related osteoporosis with current pathological fracture 11/26/2020   Pain of right upper extremity 11/11/2020   Bradycardia 11/11/2020   Neck pain 03/11/2020   Tremor 03/01/2020   Hypothyroid 03/01/2020   Physical deconditioning 11/25/2019   At risk for obstructive sleep apnea 11/25/2019   Primary  osteoarthritis of right shoulder 02/28/2019   Other forms of dyspnea 11/22/2018   Educated about COVID-19 virus infection 07/03/2018   Greater trochanteric pain syndrome of right lower extremity 03/18/2018   Chronic nonallergic rhinitis 12/20/2017   Cough 12/20/2017   Right flank pain 12/10/2017   Fatigue 07/12/2017   Morbid obesity ([place]) 03/05/2017   Chronic diastolic heart failure ([place]) 11/29/2016   Dyspnea on exertion 10/06/2016   Asthma 10/06/2016   Encounter for Medicare annual wellness exam 05/22/2016   Hyperglycemia 08/07/2015   Allergic 08/07/2015   Gout 08/07/2015   Leukocytosis 08/07/2015   Lower abdominal pain 04/21/2015   Low back pain 11/02/2014   Esophageal reflux 10/28/2014   Essential hypertension, benign 10/28/2014   Hyperlipidemia 10/28/2014   Benign essential tremor 10/28/2014   Bilateral dry eyes    Moderate persistent asthma    Arthritis    Cancer ([place])    History of chicken pox     PCP: [name], MD  REFERRING PROVIDER: Kary Kos, MD  REFERRING DIAG: M54.2 (ICD-10-CM) - Cervicalgia  THERAPY DIAG:  Cervicalgia  Stiffness of right shoulder, not elsewhere classified  Muscle weakness (generalized)  Cramp and spasm  Radiculopathy, cervical region  Abnormal posture  Other muscle  spasm  Other symptoms and signs involving the musculoskeletal system  Rationale for Evaluation and Treatment Rehabilitation  ONSET DATE: 2 weeks ago  SUBJECTIVE:                                                                                                                                                                                                         SUBJECTIVE STATEMENT: Patient reports ongoing right neck and upper arm pain.  PERTINENT HISTORY:  Cervical radiculopathy R, Chronic LBP with h/o surgery for "ruptured disc", lumbar spinal stenosis, OA, B TKA, L RTC repair, chronic diastolic HF, HLD, moderate asthma, DOE, HTN, breast cancer, benign  essential tremor, morbid obesity   PAIN:  Are you having pain? Yes: NPRS scale: 0-7 when turns head/10 Pain location: right cervical Pain description: pull Aggravating factors: turning head to right Relieving factors: rest  PAIN:  Are you having pain? Yes: NPRS scale: 8/10 Pain location: right upper arm Pain description: pulling Aggravating factors: OH movements Relieving factors: rest  PRECAUTIONS: None  WEIGHT BEARING RESTRICTIONS: No  FALLS:  Has patient fallen in last 6 months? No  LIVING ENVIRONMENT: Lives with: lives with their spouse Lives in: House/apartment Stairs: Yes: External: 1 steps; over threshold Has following equipment at home: Single point cane, Walker - 2 wheeled, Environmental consultant - 4 wheeled, Wheelchair (manual), shower chair, and bed side commode  OCCUPATION: Retired  PLOF: Independent  PATIENT GOALS: to strengthen my neck, to improve arm function  OBJECTIVE:   DIAGNOSTIC FINDINGS:   Cervical MRI - 07/07/21: 1. Degenerative changes of the cervical spine with moderate spinal canal stenosis at C5-6 and C6-7 with mild mass effect on the cord at these levels without cord signal abnormality.  2. Multilevel high-grade neural foraminal narrowing, as described above, severe bilaterally at C5-6.  PATIENT SURVEYS:  NDI 13/50 = 26% disability  COGNITION: Overall cognitive status: Within functional limits for tasks assessed  SENSATION: WFL  POSTURE: rounded shoulders, forward head, and increased thoracic kyphosis  PALPATION: Bil suboccipitals R>L, R cervical and UT tenderness   CERVICAL ROM:   Active ROM A/PROM (deg) eval  Flexion full  Extension full  Right lateral flexion WFL  Left lateral flexion WFL  Right rotation 40*  Left rotation 50   (Blank rows = not tested)  UPPER EXTREMITY ROM:   ROM A/P Right eval Left eval  Shoulder flexion 133/ 140   Shoulder extension WNL   Shoulder abduction 96/104   Shoulder internal rotation 60/72   Shoulder  external rotation 36/47    (Blank rows = not  tested)  UPPER EXTREMITY MMT:   MMT Right eval Left eval  Shoulder flexion 4 with audible popping 4+  Shoulder extension 4- 4-  Shoulder abduction 4+ 4+  Shoulder internal rotation 5 4+  Shoulder external rotation 4+ with audible popping 4-  Middle trapezius    Lower trapezius     (Blank rows = not tested)   CERVICAL MMT: 4+/5 to 5/5 except extension 4/5  CERVICAL SPECIAL TESTS:  Spurling's test: Negative and Distraction test: Negative Negative Michel Bickers and mild impingement sign R shoulder    TODAY'S TREATMENT:                                                                                                                      DATE:   12/06/21 Pulleys flexion x 3 min  Seated retraction x 10 cues to keep head level Seated cerv rotation L 5 sec hold x 10 ea; pain with R  Supine: cerv rotation R 5 sec hold x 10 (no pain) Chin tucks x 10 Shoulder flexion with cane x 10  Manual Therapy: Skilled palpation and monitoring of soft tissues during DN Performed in sitting: Trigger Point Dry-Needling  Treatment instructions: Expect mild to moderate muscle soreness. S/S of pneumothorax if dry needled over a lung field, and to seek immediate medical attention should they occur. Patient verbalized understanding of these instructions and education. Patient Consent Given: Yes Education handout provided: Previously provided Muscles treated: R cervical multifidi and UT Electrical stimulation performed: No Parameters: N/A Treatment response/outcome: Twitch Response Elicited and Palpable Increase in Muscle Length   Modalities: Ionotophoresis with 1.0 mL '4mg'$ /ml Dexamethasone - 4-6 hour patch (94m-min) to right anterior shoulder (patch #1 of 6)    12/01/21 See Pt Ed  PATIENT EDUCATION:  Education details: HEP update, postural awareness, DN rational, procedure, outcomes, potential side effects, and recommended post-treatment  exercises/activity, and Ionto patch wearing instructions  Person educated: Patient Education method: Explanation, Demonstration, Verbal cues, and Handouts Education comprehension: verbalized understanding and returned demonstration  HOME EXERCISE PROGRAM: Access Code: [email or web address] URL: [email or web address] Date: 12/01/2021 Prepared by: JAlmyra Free Exercises - Doorway Pec Stretch at 60 Degrees Abduction with Arm Straight  - 2-3 x daily - 7 x weekly - 3 reps - 30 sec hold - Standing Bilateral Low Shoulder Row with Anchored Resistance  - 1 x daily - 3 x weekly - 2 sets - 10 reps - 5 sec hold - Scapular Retraction with Resistance Advanced  - 1 x daily - 3 x weekly - 2 sets - 10 reps - 5 sec hold - Shoulder External Rotation and Scapular Retraction with Resistance  - 1 x daily - 3 x weekly - 3 sets - 10 reps - Standing Shoulder Flexion to 90 Degrees with Dumbbells  - 1 x daily - 3 x weekly - 3 sets - 10 reps - Seated Gentle Upper Trapezius Stretch  - 2-3 x daily - 7 x weekly - 3 reps - 30 sec hold - Gentle Levator  Scapulae Stretch  - 2-3 x daily - 7 x weekly - 3 reps - 30 sec hold - Seated Assisted Cervical Rotation with Towel  - 1 x daily - 7 x weekly - 2 sets - 10 reps - 3 sec hold - Mid-Lower Cervical Extension SNAG with Strap  - 1 x daily - 7 x weekly - 2 sets - 10 reps - 3 sec hold - Supine Chin Tuck  - 1 x daily - 7 x weekly - 3 sets - 10 reps - Supine Shoulder Flexion Extension AAROM with Dowel  - 2 x daily - 7 x weekly - 2 sets - 10 reps - Seated Cervical Rotation AROM  - 2 x daily - 7 x weekly - 1 sets - 10 reps - 5 sec hold - Standing Shoulder Extension with Dowel  - 2 x daily - 7 x weekly - 2 sets - 10 reps  ASSESSMENT:  CLINICAL IMPRESSION: Britlyn Martine  was able to perform right cervical rotation without pain in supine, but continued to have pain in sitting. Importance of posture reviewed. Initial trial of DN had positive results allowing her to perform seated R rotation  without pain immediately following. [name] has marked pain with palpation to the ant R shoulder. Trial of ionto patch applied today. [name] continues to demonstrate potential for improvement and would benefit from continued skilled therapy to address impairments.     OBJECTIVE IMPAIRMENTS: decreased ROM, decreased strength, increased muscle spasms, impaired flexibility, impaired UE functional use, postural dysfunction, obesity, and pain.   ACTIVITY LIMITATIONS: dressing and reach over head  PARTICIPATION LIMITATIONS:  OH ADLS  PERSONAL FACTORS: Age, Fitness, Past/current experiences, Time since onset of injury/illness/exacerbation, Transportation, and 3+ comorbidities: Chronic LBP with h/o surgery for "ruptured disc", lumbar spinal stenosis, OA, B TKA, L RTC repair, chronic diastolic HF, HLD, moderate asthma, DOE, HTN, breast cancer, benign essential tremor, morbid obesity     are also affecting patient's functional outcome.   REHAB POTENTIAL: Good  CLINICAL DECISION MAKING: Stable/uncomplicated  EVALUATION COMPLEXITY: Low   GOALS: Goals reviewed with patient? Yes  SHORT TERM GOALS: Target date: 12/15/2021 (Remove Blue Hyperlink)  Patient will be independent with initial HEP.  Baseline:  Goal status: INITIAL    LONG TERM GOALS: Target date: 01/12/2022 (Remove Blue Hyperlink)  Patient will be independent with advanced/ongoing HEP to improve outcomes and carryover.  Baseline:  Goal status: INITIAL  2.  Patient will report 50-75% improvement in neck pain to improve QOL.  Baseline:  Goal status: INITIAL  3.  Patient will demonstrate improved R cervical ROM by at least 5 deg with minimal pain for improved ease of ADLS.  Baseline:  Goal status: INITIAL  4.  Patient will report <= 11% on NDI  to demonstrate improved functional ability.  Baseline: 26% Goal status: INITIAL  5.  Patient will be able to pull up her pants without difficulty. Baseline:  Goal status: INITIAL  6. Patient  will be able to hang clothes using her R UE independently   Baseline:  Goal status: INITIAL   7. Patient will be able to reach into her second shelf cabinets without difficulty Baseline:  Goal status: INITIAL    PLAN:  PT FREQUENCY: 2x/week  PT DURATION: 6 weeks  PLANNED INTERVENTIONS: Therapeutic exercises, Therapeutic activity, Neuromuscular re-education, Patient/Family education, Self Care, Joint mobilization, Dry Needling, Electrical stimulation, Spinal mobilization, Cryotherapy, Moist heat, Taping, Traction, Ultrasound, Ionotophoresis '4mg'$ /ml Dexamethasone, Manual therapy, and Re-evaluation  PLAN FOR NEXT SESSION: Assess  response to DN and ionto; progress HEP for post neck, upper back and functional shoulder strengthening, work on R cervical Rot and R shoulder ROM, manual/DN to suboccipitals and cspine, spinal mobs prn.  Markavious Micco, PT 12/06/2021, 11:00 AM

## 2021-12-04 ENCOUNTER — Other Ambulatory Visit: Payer: Self-pay | Admitting: Family Medicine

## 2021-12-04 ENCOUNTER — Other Ambulatory Visit: Payer: Self-pay | Admitting: Interventional Cardiology

## 2021-12-04 ENCOUNTER — Other Ambulatory Visit: Payer: Self-pay | Admitting: Neurology

## 2021-12-04 DIAGNOSIS — G250 Essential tremor: Secondary | ICD-10-CM

## 2021-12-04 DIAGNOSIS — E782 Mixed hyperlipidemia: Secondary | ICD-10-CM

## 2021-12-06 ENCOUNTER — Ambulatory Visit: Payer: Medicare Other | Admitting: Physical Therapy

## 2021-12-06 ENCOUNTER — Encounter: Payer: Self-pay | Admitting: Physical Therapy

## 2021-12-06 DIAGNOSIS — M6281 Muscle weakness (generalized): Secondary | ICD-10-CM

## 2021-12-06 DIAGNOSIS — R293 Abnormal posture: Secondary | ICD-10-CM | POA: Diagnosis not present

## 2021-12-06 DIAGNOSIS — M62838 Other muscle spasm: Secondary | ICD-10-CM

## 2021-12-06 DIAGNOSIS — M5412 Radiculopathy, cervical region: Secondary | ICD-10-CM

## 2021-12-06 DIAGNOSIS — M25611 Stiffness of right shoulder, not elsewhere classified: Secondary | ICD-10-CM | POA: Diagnosis not present

## 2021-12-06 DIAGNOSIS — M542 Cervicalgia: Secondary | ICD-10-CM | POA: Diagnosis not present

## 2021-12-06 DIAGNOSIS — R252 Cramp and spasm: Secondary | ICD-10-CM

## 2021-12-06 DIAGNOSIS — R29898 Other symptoms and signs involving the musculoskeletal system: Secondary | ICD-10-CM

## 2021-12-06 NOTE — Telephone Encounter (Signed)
No further refills until seen by Dr.Rebecca Tat

## 2021-12-08 ENCOUNTER — Ambulatory Visit: Payer: Medicare Other | Attending: Neurosurgery | Admitting: Physical Therapy

## 2021-12-08 ENCOUNTER — Encounter: Payer: Self-pay | Admitting: Physical Therapy

## 2021-12-08 DIAGNOSIS — M25611 Stiffness of right shoulder, not elsewhere classified: Secondary | ICD-10-CM | POA: Diagnosis not present

## 2021-12-08 DIAGNOSIS — M542 Cervicalgia: Secondary | ICD-10-CM | POA: Insufficient documentation

## 2021-12-08 DIAGNOSIS — R252 Cramp and spasm: Secondary | ICD-10-CM | POA: Insufficient documentation

## 2021-12-08 DIAGNOSIS — M6281 Muscle weakness (generalized): Secondary | ICD-10-CM | POA: Insufficient documentation

## 2021-12-08 NOTE — Therapy (Signed)
OUTPATIENT PHYSICAL THERAPY TREATMENT   Patient Name: Amanda Copeland MRN: [id number] DOB:1940/04/27, 81 y.o., female Today's Date: 12/08/2021   PT End of Session - 12/08/21 0931     Visit Number 3    Date for PT Re-Evaluation 01/12/22    Authorization Type Medicare & BCBS    Progress Note Due on Visit 10    PT Start Time 0931    PT Stop Time 1016    PT Time Calculation (min) 45 min    Activity Tolerance Patient tolerated treatment well    Behavior During Therapy [facility] for tasks assessed/performed              Past Medical History:  Diagnosis Date   Arthritis    Asthma    childhood now returning   Back pain affecting pregnancy 11/02/2014   Benign essential tremor 10/28/2014   Bilateral dry eyes    Breast cancer ([place])    right   Cancer ([place])    Breast   Dry eyes    Encounter for Medicare annual wellness exam 05/22/2016   Essential hypertension, benign 10/28/2014   H/O measles    H/O mumps    History of chicken pox    Lumbago 11/02/2014   Mixed hyperlipidemia 10/28/2014   Obesity 10/28/2014   Stenosis of cervical spine    Past Surgical History:  Procedure Laterality Date   BACK SURGERY     BREAST REDUCTION SURGERY Left    CHOLECYSTECTOMY     EYE SURGERY  2008   b/l cataracts removed, in [place] Bilateral    ROTATOR CUFF REPAIR Left    Patient Active Problem List   Diagnosis Date Noted   Osteoporosis 05/25/2021   Scapular dysfunction 03/17/2021   Compression fracture of T11 vertebra ([place]) 01/20/2021   SI joint arthritis 01/20/2021   Vitamin D deficiency 01/20/2021   Age-related osteoporosis with current pathological fracture 11/26/2020   Pain of right upper extremity 11/11/2020   Bradycardia 11/11/2020   Neck pain 03/11/2020   Tremor 03/01/2020   Hypothyroid 03/01/2020   Physical deconditioning 11/25/2019   At risk for obstructive sleep apnea 11/25/2019   Primary  osteoarthritis of right shoulder 02/28/2019   Other forms of dyspnea 11/22/2018   Educated about COVID-19 virus infection 07/03/2018   Greater trochanteric pain syndrome of right lower extremity 03/18/2018   Chronic nonallergic rhinitis 12/20/2017   Cough 12/20/2017   Right flank pain 12/10/2017   Fatigue 07/12/2017   Morbid obesity ([place]) 03/05/2017   Chronic diastolic heart failure ([place]) 11/29/2016   Dyspnea on exertion 10/06/2016   Asthma 10/06/2016   Encounter for Medicare annual wellness exam 05/22/2016   Hyperglycemia 08/07/2015   Allergic 08/07/2015   Gout 08/07/2015   Leukocytosis 08/07/2015   Lower abdominal pain 04/21/2015   Low back pain 11/02/2014   Esophageal reflux 10/28/2014   Essential hypertension, benign 10/28/2014   Hyperlipidemia 10/28/2014   Benign essential tremor 10/28/2014   Bilateral dry eyes    Moderate persistent asthma    Arthritis    Cancer ([place])    History of chicken pox     PCP: [name], MD  REFERRING PROVIDER: Kary Kos, MD  REFERRING DIAG: M54.2 (ICD-10-CM) - Cervicalgia  THERAPY DIAG:  Cervicalgia  Stiffness of right shoulder, not elsewhere classified  Muscle weakness (generalized)  Cramp and spasm  Rationale for Evaluation and Treatment Rehabilitation  ONSET DATE:  2 weeks ago  SUBJECTIVE:                                                                                                                                                                                                         SUBJECTIVE STATEMENT: Pt reports ongoing pain in R shoulder to the elbow, no pain in neck. DN seemed to help last week.   PERTINENT HISTORY:  Cervical radiculopathy R, Chronic LBP with h/o surgery for "ruptured disc", lumbar spinal stenosis, OA, B TKA, L RTC repair, chronic diastolic HF, HLD, moderate asthma, DOE, HTN, breast cancer, benign essential tremor, morbid obesity   PAIN:  Are you having pain? Yes: NPRS scale: 9/10 Pain  location: right shoulder Pain description: pull Aggravating factors: turning head to right Relieving factors: rest    PRECAUTIONS: None  WEIGHT BEARING RESTRICTIONS: No  FALLS:  Has patient fallen in last 6 months? No  LIVING ENVIRONMENT: Lives with: lives with their spouse Lives in: House/apartment Stairs: Yes: External: 1 steps; over threshold Has following equipment at home: Single point cane, Walker - 2 wheeled, Environmental consultant - 4 wheeled, Wheelchair (manual), shower chair, and bed side commode  OCCUPATION: Retired  PLOF: Independent  PATIENT GOALS: to strengthen my neck, to improve arm function  OBJECTIVE:   DIAGNOSTIC FINDINGS:   Cervical MRI - 07/07/21: 1. Degenerative changes of the cervical spine with moderate spinal canal stenosis at C5-6 and C6-7 with mild mass effect on the cord at these levels without cord signal abnormality.  2. Multilevel high-grade neural foraminal narrowing, as described above, severe bilaterally at C5-6.  PATIENT SURVEYS:  NDI 13/50 = 26% disability  COGNITION: Overall cognitive status: Within functional limits for tasks assessed  SENSATION: WFL  POSTURE: rounded shoulders, forward head, and increased thoracic kyphosis  PALPATION: Bil suboccipitals R>L, R cervical and UT tenderness   CERVICAL ROM:   Active ROM A/PROM (deg) eval  Flexion full  Extension full  Right lateral flexion WFL  Left lateral flexion WFL  Right rotation 40*  Left rotation 50   (Blank rows = not tested)  UPPER EXTREMITY ROM:   ROM A/P Right eval Left eval  Shoulder flexion 133/ 140   Shoulder extension WNL   Shoulder abduction 96/104   Shoulder internal rotation 60/72   Shoulder external rotation 36/47    (Blank rows = not tested)  UPPER EXTREMITY MMT:   MMT Right eval Left eval  Shoulder flexion 4 with audible popping 4+  Shoulder extension 4- 4-  Shoulder abduction 4+ 4+  Shoulder internal rotation 5 4+  Shoulder external rotation 4+ with  audible popping 4-  Middle trapezius    Lower trapezius     (Blank rows = not tested)   CERVICAL MMT: 4+/5 to 5/5 except extension 4/5  CERVICAL SPECIAL TESTS:  Spurling's test: Negative and Distraction test: Negative Negative Michel Bickers and mild impingement sign R shoulder    TODAY'S TREATMENT:                                                                                                                      DATE:  12/08/21 THERAPEUTIC EXERCISE: to improve flexibility, strength and mobility.  Verbal and tactile cues throughout for technique. Pulleys flexion x 3 min, scaption x 3 min Seated cerv rotation L & R with 5 sec hold x 10 each- cues to for postural correction and retraction of soulders- pain to the R side Seated cerv retraction 1x10 Seated cerv retraction red TB 2x10 Seated scap retraction + 90 deg shoulder flex with pool noodle for tactile cues 2x10- cueing for retraction throughout motion Seated scap retraction + elbow 90 deg ER with pool noddle for tactile cues x10 Thoracic extension against chair with pool noodle for tactile cues 3x10- cueing to rotate palms anterior to increase extension- reduced extension motion due to increased pain in R shoulder Cervical Extension SNAG with pillowcase 10x 5 sec holds  MANUAL THERAPY: To promote improved flexibility, improved joint mobility, increased ROM, and reduced pain.  Seated scap retraction with manual shoulder distraction   12/06/21 Pulleys flexion x 3 min  Seated retraction x 10 cues to keep head level Seated cerv rotation L 5 sec hold x 10 ea; pain with R  Supine: cerv rotation R 5 sec hold x 10 (no pain) Chin tucks x 10 Shoulder flexion with cane x 10  Manual Therapy: Skilled palpation and monitoring of soft tissues during DN Performed in sitting: Trigger Point Dry-Needling  Treatment instructions: Expect mild to moderate muscle soreness. S/S of pneumothorax if dry needled over a lung field, and to seek  immediate medical attention should they occur. Patient verbalized understanding of these instructions and education. Patient Consent Given: Yes Education handout provided: Previously provided Muscles treated: R cervical multifidi and UT Electrical stimulation performed: No Parameters: N/A Treatment response/outcome: Twitch Response Elicited and Palpable Increase in Muscle Length   Modalities: Ionotophoresis with 1.0 mL '4mg'$ /ml Dexamethasone - 4-6 hour patch (57m-min) to right anterior shoulder (patch #1 of 6)    12/01/21 See Pt Ed  PATIENT EDUCATION:  Education details: HEP update, postural awareness, DN rational, procedure, outcomes, potential side effects, and recommended post-treatment exercises/activity, and Ionto patch wearing instructions  Person educated: Patient Education method: Explanation, Demonstration, Verbal cues, and Handouts Education comprehension: verbalized understanding and returned demonstration  HOME EXERCISE PROGRAM: Access Code: [email or web address] URL: [email or web address] Date: 12/08/2021 Prepared by: OZeb Comfort Exercises - Doorway Pec Stretch at 60 Degrees Abduction with Arm Straight  - 2-3 x daily - 7 x weekly - 3 reps - 30 sec hold -  Standing Bilateral Low Shoulder Row with Anchored Resistance  - 1 x daily - 3 x weekly - 2 sets - 10 reps - 5 sec hold - Scapular Retraction with Resistance Advanced  - 1 x daily - 3 x weekly - 2 sets - 10 reps - 5 sec hold - Shoulder External Rotation and Scapular Retraction with Resistance  - 1 x daily - 3 x weekly - 3 sets - 10 reps - Standing Shoulder Flexion to 90 Degrees with Dumbbells  - 1 x daily - 3 x weekly - 3 sets - 10 reps - Seated Gentle Upper Trapezius Stretch  - 2-3 x daily - 7 x weekly - 3 reps - 30 sec hold - Gentle Levator Scapulae Stretch  - 2-3 x daily - 7 x weekly - 3 reps - 30 sec hold - Seated Assisted Cervical Rotation with Towel  - 1 x daily - 7 x weekly - 2 sets - 10 reps - 3 sec hold -  Mid-Lower Cervical Extension SNAG with Strap  - 1 x daily - 7 x weekly - 2 sets - 10 reps - 3 sec hold - Supine Chin Tuck  - 1 x daily - 7 x weekly - 3 sets - 10 reps - Supine Shoulder Flexion Extension AAROM with Dowel  - 2 x daily - 7 x weekly - 2 sets - 10 reps - Seated Cervical Rotation AROM  - 2 x daily - 7 x weekly - 1 sets - 10 reps - 5 sec hold - Standing Shoulder Extension with Dowel  - 2 x daily - 7 x weekly - 2 sets - 10 reps - Seated Thoracic Lumbar Extension  - 1 x daily - 7 x weekly - 3 sets - 10 reps - 5 hold - Seated Shoulder Flexion  - 1 x daily - 7 x weekly - 3 sets - 10 reps  ASSESSMENT:  CLINICAL IMPRESSION: Zipporah was able to tolerate cervical rotation R and L in sitting, but continued to have pain during shoulder flexion to 90 deg. She was able to perform shoulder retraction in seated with 90 deg shoulder flexion and shoulder ER but required continued tactile and verbal cues to maintain shoulder retraction and depression through ROM. She will benefit from continued skilled therapy to address impairments in shoulder ROM and functional use.    OBJECTIVE IMPAIRMENTS: decreased ROM, decreased strength, increased muscle spasms, impaired flexibility, impaired UE functional use, postural dysfunction, obesity, and pain.   ACTIVITY LIMITATIONS: dressing and reach over head  PARTICIPATION LIMITATIONS:  OH ADLS  PERSONAL FACTORS: Age, Fitness, Past/current experiences, Time since onset of injury/illness/exacerbation, Transportation, and 3+ comorbidities: Chronic LBP with h/o surgery for "ruptured disc", lumbar spinal stenosis, OA, B TKA, L RTC repair, chronic diastolic HF, HLD, moderate asthma, DOE, HTN, breast cancer, benign essential tremor, morbid obesity     are also affecting patient's functional outcome.   REHAB POTENTIAL: Good  CLINICAL DECISION MAKING: Stable/uncomplicated  EVALUATION COMPLEXITY: Low   GOALS: Goals reviewed with patient? Yes  SHORT TERM GOALS: Target  date: 12/15/2021  Patient will be independent with initial HEP.  Baseline:  Goal status: IN PROGRESS    LONG TERM GOALS: Target date: 01/12/2022 (Remove Blue Hyperlink)  Patient will be independent with advanced/ongoing HEP to improve outcomes and carryover.  Baseline:  Goal status: IN PROGRESS  2.  Patient will report 50-75% improvement in neck pain to improve QOL.  Baseline:  Goal status: IN PROGRESS  3.  Patient will  demonstrate improved R cervical ROM by at least 5 deg with minimal pain for improved ease of ADLS.  Baseline:  Goal status: IN PROGRESS  4.  Patient will report <= 11% on NDI  to demonstrate improved functional ability.  Baseline: 26% Goal status: IN PROGRESS  5.  Patient will be able to pull up her pants without difficulty. Baseline:  Goal status: IN PROGRESS  6. Patient will be able to hang clothes using her R UE independently   Baseline:  Goal status: IN PROGRESS   7. Patient will be able to reach into her second shelf cabinets without difficulty Baseline:  Goal status: IN PROGRESS    PLAN:  PT FREQUENCY: 2x/week  PT DURATION: 6 weeks  PLANNED INTERVENTIONS: Therapeutic exercises, Therapeutic activity, Neuromuscular re-education, Patient/Family education, Self Care, Joint mobilization, Dry Needling, Electrical stimulation, Spinal mobilization, Cryotherapy, Moist heat, Taping, Traction, Ultrasound, Ionotophoresis '4mg'$ /ml Dexamethasone, Manual therapy, and Re-evaluation  PLAN FOR NEXT SESSION: Assess STG; progress HEP for neck, upper back and functional shoulder strengthening, progress R cerv rotation motion,  manual/DN to suboccipitals and cspine, spinal mobs prn.  Zeb Comfort, Student-PT 12/08/2021, 1:25 PM

## 2021-12-10 NOTE — Progress Notes (Unsigned)
Assessment/Plan:    1.  Essential Tremor  -She will continue propranolol, 60 mg bid 2.  History of gabapentin induced myoclonus  -Off of gabapentin 3.  Follow-up 1 year. Subjective:   Amanda Copeland was seen today in follow up for essential tremor.  My previous records were reviewed prior to todays visit.  Patient with her husband who supplements the history.  She emailed our office when I was gone in July about increasing tremor and she was told she could increase the propranolol to 40 mg, 1 and half tablets twice per day.  Medical records reviewed since last visit.  She has been attending physical therapy for neck pain.  Current prescribed movement disorder medications: Propranolol, 40 mg, 1-1/2 tablets twice per day   PREVIOUS MEDICATIONS:  propranolol ; metoprolol (didn't help); primidone (made too sleepy and didn't help at 100 mg daily); topamax (not helpful and SE); gabapentin (myoclonus)  ALLERGIES:   Allergies  Allergen Reactions   Amoxicillin Rash   Ampicillin Rash   Penicillins Rash   Statins Other (See Comments)    Per reports muscle aches and joint pain Per reports muscle aches and joint pain    CURRENT MEDICATIONS:  Outpatient Encounter Medications as of 12/13/2021  Medication Sig   albuterol (PROVENTIL HFA;VENTOLIN HFA) 108 (90 Base) MCG/ACT inhaler INHALE 2 PUFFS INTO THE LUNGS EVERY 4 (FOUR) HOURS AS NEEDED FOR WHEEZING OR SHORTNESS OF BREATH.   allopurinol (ZYLOPRIM) 100 MG tablet TAKE 2 TABLETS BY MOUTH EVERY DAY   AMBULATORY NON FORMULARY MEDICATION Rollator  Dx: Gait instability   amLODipine (NORVASC) 5 MG tablet TAKE 1 TABLET (5 MG TOTAL) BY MOUTH DAILY.   Ascorbic Acid (VITAMIN C PO) Take 1 tablet by mouth daily.    aspirin 81 MG tablet Take 81 mg by mouth daily.   colchicine 0.6 MG tablet Take 2 tabs at first and then 1 tab q 2 hours til pain relief or max of 6 tabs in 24 hours (Patient taking differently: Take 2 tabs at first and then 1 tab q 2 hours  til pain relief or max of 6 tabs in 24 hours - PRN for gout flare-up)   doxycycline (VIBRA-TABS) 100 MG tablet Take 1 tablet (100 mg total) by mouth 2 (two) times daily.   DYMISTA 137-50 MCG/ACT SUSP 1-2 SPRAYS PER NOSTRIL TWICE DAILY FOR RUNNY NOSE   ezetimibe (ZETIA) 10 MG tablet TAKE 1 TABLET BY MOUTH EVERY DAY   famotidine (PEPCID) 40 MG tablet Take 1 tablet (40 mg total) by mouth at bedtime.   fluticasone (FLOVENT HFA) 110 MCG/ACT inhaler 2 PUFFS TWICE DAILY TO PREVENT COUGHING OR WHEEZING. MAX INS WILL COVER IS A 30 DAY   furosemide (LASIX) 40 MG tablet TAKE 1 TABLET BY MOUTH EVERY DAY   KLOR-CON M20 20 MEQ tablet TAKE 1 TABLET BY MOUTH DAILY. KEEP UPCOMING APPOINTMENT   levothyroxine (SYNTHROID) 112 MCG tablet TAKE 1 TABLET BY MOUTH EVERY DAY BEFORE BREAKFAST   loratadine (CLARITIN) 10 MG tablet Take 1 tablet (10 mg total) by mouth daily.   Multiple Vitamins-Minerals (ICAPS AREDS 2) CAPS Take 1 capsule by mouth 2 (two) times daily.   PREVACID 30 MG capsule TAKE 1 CAPSULE BY MOUTH TWICE A DAY --MAX LIMIT   promethazine-dextromethorphan (PROMETHAZINE-DM) 6.25-15 MG/5ML syrup Take 2.5-5 mLs by mouth 3 (three) times daily as needed for cough.   Red Yeast Rice Extract (RED YEAST RICE PO) Take by mouth daily.   Vitamin D, Ergocalciferol, (DRISDOL) 1.25  MG (50000 UNIT) CAPS capsule TAKE 1 CAPSULE (50,000 UNITS TOTAL) BY MOUTH EVERY 7 (SEVEN) DAYS   [DISCONTINUED] propranolol (INDERAL) 40 MG tablet TAKE 1 TABLET BY MOUTH TWICE A DAY   No facility-administered encounter medications on file as of 12/13/2021.     Objective:    PHYSICAL EXAMINATION:    VITALS:   Vitals:   12/13/21 1126  BP: 124/74  Pulse: 64  SpO2: 98%  Weight: 236 lb 3.2 oz (107.1 kg)  Height: '5\' 1"'$  (1.549 m)      GEN:  The patient appears stated age and is in NAD. HEENT:  Normocephalic, atraumatic.  The mucous membranes are moist. The superficial temporal arteries are without ropiness or  tenderness.   Neurological examination:  Orientation: The patient is alert and oriented x3. Cranial nerves: There is good facial symmetry. The speech is fluent and clear. Soft palate rises symmetrically and there is no tongue deviation. Hearing is intact to conversational tone. Sensation: Sensation is intact to light touch throughout Motor: Strength is at least antigravity x4.  Movement examination: Tone: There is normal tone in the UE/LE Abnormal movements: There is no rest tremor.  There is no postural tremor (same as previous).  There is no intention tremor.  She is able to draw Archimedes spirals without trouble. Coordination:  There is no decremation with RAM's Gait and Station: Not tested today. I have reviewed and interpreted the following labs independently   Chemistry      Component Value Date/Time   NA 143 11/25/2021 1345   NA 145 (H) 06/11/2019 1141   K 4.0 11/25/2021 1345   CL 103 11/25/2021 1345   CO2 27 11/25/2021 1345   BUN 14 11/25/2021 1345   BUN 10 06/11/2019 1141   CREATININE 0.83 11/25/2021 1345      Component Value Date/Time   CALCIUM 9.2 11/25/2021 1345   ALKPHOS 71 11/25/2021 1345   AST 16 11/25/2021 1345   ALT 9 11/25/2021 1345   BILITOT 0.4 11/25/2021 1345      Lab Results  Component Value Date   WBC 9.0 11/25/2021   HGB 12.8 11/25/2021   HCT 39.8 11/25/2021   MCV 96.6 11/25/2021   PLT 271.0 11/25/2021   Lab Results  Component Value Date   TSH 2.07 11/25/2021     Chemistry      Component Value Date/Time   NA 143 11/25/2021 1345   NA 145 (H) 06/11/2019 1141   K 4.0 11/25/2021 1345   CL 103 11/25/2021 1345   CO2 27 11/25/2021 1345   BUN 14 11/25/2021 1345   BUN 10 06/11/2019 1141   CREATININE 0.83 11/25/2021 1345      Component Value Date/Time   CALCIUM 9.2 11/25/2021 1345   ALKPHOS 71 11/25/2021 1345   AST 16 11/25/2021 1345   ALT 9 11/25/2021 1345   BILITOT 0.4 11/25/2021 1345        Cc:  [name], MD

## 2021-12-13 ENCOUNTER — Ambulatory Visit (INDEPENDENT_AMBULATORY_CARE_PROVIDER_SITE_OTHER): Payer: Medicare Other | Admitting: Neurology

## 2021-12-13 ENCOUNTER — Encounter: Payer: Self-pay | Admitting: Neurology

## 2021-12-13 VITALS — BP 124/74 | HR 64 | Ht 61.0 in | Wt 236.2 lb

## 2021-12-13 DIAGNOSIS — G250 Essential tremor: Secondary | ICD-10-CM | POA: Diagnosis not present

## 2021-12-13 MED ORDER — PROPRANOLOL HCL 60 MG PO TABS: 60.0000 mg | ORAL_TABLET | Freq: Two times a day (BID) | ORAL | 3 refills | Status: DC

## 2021-12-14 ENCOUNTER — Encounter: Payer: Self-pay | Admitting: Physical Therapy

## 2021-12-14 ENCOUNTER — Ambulatory Visit: Payer: Medicare Other | Admitting: Physical Therapy

## 2021-12-14 DIAGNOSIS — M6281 Muscle weakness (generalized): Secondary | ICD-10-CM

## 2021-12-14 DIAGNOSIS — M542 Cervicalgia: Secondary | ICD-10-CM

## 2021-12-14 DIAGNOSIS — R252 Cramp and spasm: Secondary | ICD-10-CM

## 2021-12-14 DIAGNOSIS — M25611 Stiffness of right shoulder, not elsewhere classified: Secondary | ICD-10-CM

## 2021-12-14 NOTE — Therapy (Signed)
OUTPATIENT PHYSICAL THERAPY TREATMENT   Patient Name: Amanda Copeland MRN: [id number] DOB:1940/07/20, 81 y.o., female Today's Date: 12/14/2021   PT End of Session - 12/14/21 1015     Visit Number 4    Date for PT Re-Evaluation 01/12/22    Authorization Type Medicare & BCBS    Progress Note Due on Visit 10    PT Start Time 1015    PT Stop Time 1102    PT Time Calculation (min) 47 min    Activity Tolerance Patient tolerated treatment well    Behavior During Therapy WFL for tasks assessed/performed              Past Medical History:  Diagnosis Date   Arthritis    Asthma    childhood now returning   Back pain affecting pregnancy 11/02/2014   Benign essential tremor 10/28/2014   Bilateral dry eyes    Breast cancer ([place])    right   Cancer ([place])    Breast   Dry eyes    Encounter for Medicare annual wellness exam 05/22/2016   Essential hypertension, benign 10/28/2014   H/O measles    H/O mumps    History of chicken pox    Lumbago 11/02/2014   Mixed hyperlipidemia 10/28/2014   Obesity 10/28/2014   Stenosis of cervical spine    Past Surgical History:  Procedure Laterality Date   BACK SURGERY     BREAST REDUCTION SURGERY Left    CHOLECYSTECTOMY     EYE SURGERY  2008   b/l cataracts removed, in [place] Bilateral    ROTATOR CUFF REPAIR Left    Patient Active Problem List   Diagnosis Date Noted   Osteoporosis 05/25/2021   Scapular dysfunction 03/17/2021   Compression fracture of T11 vertebra ([place]) 01/20/2021   SI joint arthritis 01/20/2021   Vitamin D deficiency 01/20/2021   Age-related osteoporosis with current pathological fracture 11/26/2020   Pain of right upper extremity 11/11/2020   Bradycardia 11/11/2020   Neck pain 03/11/2020   Tremor 03/01/2020   Hypothyroid 03/01/2020   Physical deconditioning 11/25/2019   At risk for obstructive sleep apnea 11/25/2019   Primary  osteoarthritis of right shoulder 02/28/2019   Other forms of dyspnea 11/22/2018   Educated about COVID-19 virus infection 07/03/2018   Greater trochanteric pain syndrome of right lower extremity 03/18/2018   Chronic nonallergic rhinitis 12/20/2017   Cough 12/20/2017   Right flank pain 12/10/2017   Fatigue 07/12/2017   Morbid obesity ([place]) 03/05/2017   Chronic diastolic heart failure ([place]) 11/29/2016   Dyspnea on exertion 10/06/2016   Asthma 10/06/2016   Encounter for Medicare annual wellness exam 05/22/2016   Hyperglycemia 08/07/2015   Allergic 08/07/2015   Gout 08/07/2015   Leukocytosis 08/07/2015   Lower abdominal pain 04/21/2015   Low back pain 11/02/2014   Esophageal reflux 10/28/2014   Essential hypertension, benign 10/28/2014   Hyperlipidemia 10/28/2014   Benign essential tremor 10/28/2014   Bilateral dry eyes    Moderate persistent asthma    Arthritis    Cancer ([place])    History of chicken pox     PCP: [name], MD  REFERRING PROVIDER: Kary Kos, MD  REFERRING DIAG: M54.2 (ICD-10-CM) - Cervicalgia  THERAPY DIAG:  Cervicalgia  Stiffness of right shoulder, not elsewhere classified  Muscle weakness (generalized)  Cramp and spasm  Rationale for Evaluation and Treatment Rehabilitation  ONSET DATE:  2 weeks ago  SUBJECTIVE:                                                                                                                                                                                                         SUBJECTIVE STATEMENT:  [name] states she is having increased pain today and that her shoulder has been more aggravating then usual. She is able to do HEP but states that it makes the pain worse.  PERTINENT HISTORY:  Cervical radiculopathy R, Chronic LBP with h/o surgery for "ruptured disc", lumbar spinal stenosis, OA, B TKA, L RTC repair, chronic diastolic HF, HLD, moderate asthma, DOE, HTN, breast cancer, benign essential tremor, morbid  obesity   PAIN:  Are you having pain? Yes: NPRS scale: 8/10 Pain location: right shoulder Pain description: pull Aggravating factors: turning head to right Relieving factors: Advil    PRECAUTIONS: None  WEIGHT BEARING RESTRICTIONS: No  FALLS:  Has patient fallen in last 6 months? No  LIVING ENVIRONMENT: Lives with: lives with their spouse Lives in: House/apartment Stairs: Yes: External: 1 steps; over threshold Has following equipment at home: Single point cane, Walker - 2 wheeled, Environmental consultant - 4 wheeled, Wheelchair (manual), shower chair, and bed side commode  OCCUPATION: Retired  PLOF: Independent  PATIENT GOALS: to strengthen my neck, to improve arm function  OBJECTIVE:   DIAGNOSTIC FINDINGS:   Cervical MRI - 07/07/21: 1. Degenerative changes of the cervical spine with moderate spinal canal stenosis at C5-6 and C6-7 with mild mass effect on the cord at these levels without cord signal abnormality.  2. Multilevel high-grade neural foraminal narrowing, as described above, severe bilaterally at C5-6.  PATIENT SURVEYS:  NDI 13/50 = 26% disability  COGNITION: Overall cognitive status: Within functional limits for tasks assessed  SENSATION: WFL  POSTURE: rounded shoulders, forward head, and increased thoracic kyphosis  PALPATION: Bil suboccipitals R>L, R cervical and UT tenderness   CERVICAL ROM:   Active ROM A/PROM (deg) eval  Flexion full  Extension full  Right lateral flexion WFL  Left lateral flexion WFL  Right rotation 40*  Left rotation 50   (Blank rows = not tested)  UPPER EXTREMITY ROM:   ROM A/P Right eval Left eval  Shoulder flexion 133/ 140   Shoulder extension WNL   Shoulder abduction 96/104   Shoulder internal rotation 60/72   Shoulder external rotation 36/47    (Blank rows = not tested)  UPPER EXTREMITY MMT:   MMT Right eval Left eval  Shoulder flexion 4 with audible popping 4+  Shoulder extension 4-  4-  Shoulder abduction 4+ 4+   Shoulder internal rotation 5 4+  Shoulder external rotation 4+ with audible popping 4-  Middle trapezius    Lower trapezius     (Blank rows = not tested)   CERVICAL MMT: 4+/5 to 5/5 except extension 4/5  CERVICAL SPECIAL TESTS:  Spurling's test: Negative and Distraction test: Negative Negative Michel Bickers and mild impingement sign R shoulder    TODAY'S TREATMENT:                                                                                                                      DATE:  12/14/21 THERAPEUTIC EXERCISE: to improve flexibility, strength and mobility.  Verbal and tactile cues throughout for technique.  Pulleys flexion x3 min, scaption x3 min Seated UT stretch R & L 2x30 sec hold Seated Cer Retraction with RTB 2x10 with 5 sec hold Standing scap retraction at doorframe with 5 sec hold 2x10- cues to decrease shoulder elevation Standing Rows in door with RTB 2x10- verbal and tactile cues to increase scap retraction Standing Scap in door with elbows extended and RTB 2x10 Seated Forward Shoulder Flexion AROM 1x5- limited to pain Seated Forward Shoulder Flexion AAROM with cane 1x10- cues for scap retraction and upright posture Seated Scapular Plane Shoulder ABD AAROM with cane 1x10  MANUAL THERAPY: To promote normalized muscle tension and reduced pain.  STM and TrP release to ant deltoid   12/08/21 THERAPEUTIC EXERCISE: to improve flexibility, strength and mobility.  Verbal and tactile cues throughout for technique. Pulleys flexion x 3 min, scaption x 3 min Seated cerv rotation L & R with 5 sec hold x 10 each- cues to for postural correction and retraction of soulders- pain to the R side Seated cerv retraction 1x10 Seated cerv retraction red TB 2x10 Seated scap retraction + 90 deg shoulder flex with pool noodle for tactile cues 2x10- cueing for retraction throughout motion Seated scap retraction + elbow 90 deg ER with pool noddle for tactile cues x10 Thoracic extension  against chair with pool noodle for tactile cues 3x10- cueing to rotate palms anterior to increase extension- reduced extension motion due to increased pain in R shoulder Cervical Extension SNAG with pillowcase 10x 5 sec holds  MANUAL THERAPY: To promote improved flexibility, improved joint mobility, increased ROM, and reduced pain.  Seated scap retraction with manual shoulder distraction   12/06/21 Pulleys flexion x 3 min  Seated retraction x 10 cues to keep head level Seated cerv rotation L 5 sec hold x 10 ea; pain with R  Supine: cerv rotation R 5 sec hold x 10 (no pain) Chin tucks x 10 Shoulder flexion with cane x 10  Manual Therapy: Skilled palpation and monitoring of soft tissues during DN Performed in sitting: Trigger Point Dry-Needling  Treatment instructions: Expect mild to moderate muscle soreness. S/S of pneumothorax if dry needled over a lung field, and to seek immediate medical attention should they occur. Patient verbalized understanding of these instructions and education. Patient  Consent Given: Yes Education handout provided: Previously provided Muscles treated: R cervical multifidi and UT Electrical stimulation performed: No Parameters: N/A Treatment response/outcome: Twitch Response Elicited and Palpable Increase in Muscle Length  Modalities: Ionotophoresis with 1.0 mL 73m/ml Dexamethasone - 4-6 hour patch (835mmin) to right anterior shoulder (patch #1 of 6)    12/01/21 See Pt Ed  PATIENT EDUCATION:  Education details: HEP progression - AAROM shoulder flexion and scap plane with cane, postural awareness, and posture and body mechanics for typical daily postioning, mobility and household tasks  Person educated: Patient Education method: ExConsulting civil engineerDemonstration, Verbal cues, and Handouts Education comprehension: verbalized understanding and returned demonstration  HOME EXERCISE PROGRAM: Access Code: [email or web address] URL:  [email or web address] Date: 12/14/2021 Prepared by: OlZeb ComfortExercises - Doorway Pec Stretch at 60 Degrees Abduction with Arm Straight  - 2-3 x daily - 7 x weekly - 3 reps - 30 sec hold - Standing Bilateral Low Shoulder Row with Anchored Resistance  - 1 x daily - 3 x weekly - 2 sets - 10 reps - 5 sec hold - Scapular Retraction with Resistance Advanced  - 1 x daily - 3 x weekly - 2 sets - 10 reps - 5 sec hold - Shoulder External Rotation and Scapular Retraction with Resistance  - 1 x daily - 3 x weekly - 3 sets - 10 reps - Standing Shoulder Flexion to 90 Degrees with Dumbbells  - 1 x daily - 3 x weekly - 3 sets - 10 reps - Seated Gentle Upper Trapezius Stretch  - 2-3 x daily - 7 x weekly - 3 reps - 30 sec hold - Gentle Levator Scapulae Stretch  - 2-3 x daily - 7 x weekly - 3 reps - 30 sec hold - Seated Assisted Cervical Rotation with Towel  - 1 x daily - 7 x weekly - 2 sets - 10 reps - 3 sec hold - Mid-Lower Cervical Extension SNAG with Strap  - 1 x daily - 7 x weekly - 2 sets - 10 reps - 3 sec hold - Supine Chin Tuck  - 1 x daily - 7 x weekly - 3 sets - 10 reps - Supine Shoulder Flexion Extension AAROM with Dowel  - 2 x daily - 7 x weekly - 2 sets - 10 reps - Seated Cervical Rotation AROM  - 2 x daily - 7 x weekly - 1 sets - 10 reps - 5 sec hold - Standing Shoulder Extension with Dowel  - 2 x daily - 7 x weekly - 2 sets - 10 reps - Seated Thoracic Lumbar Extension  - 1 x daily - 7 x weekly - 3 sets - 10 reps - 5 hold - Seated Shoulder Flexion  - 1 x daily - 7 x weekly - 3 sets - 10 reps - Seated Shoulder Flexion AAROM with Dowel  - 1 x daily - 7 x weekly - 2 sets - 10 reps - Seated Shoulder Abduction AAROM with Dowel  - 1 x daily - 7 x weekly - 2 sets - 10 reps  ASSESSMENT:  CLINICAL IMPRESSION: JaJasmineolerated session well but continues to have pain during shoulder flexion and scapular plane motion to 90 deg. She was able to perform increased shoulder motion of AAROM  with cane.She was able to perform shoulder retraction interventions but required repeated verbal and tactile cues to maintain retraction through motion.  She benefit from continued therapy to address impairments.   OBJECTIVE IMPAIRMENTS: decreased ROM, decreased strength, increased muscle  spasms, impaired flexibility, impaired UE functional use, postural dysfunction, obesity, and pain.   ACTIVITY LIMITATIONS: dressing and reach over head  PARTICIPATION LIMITATIONS:  OH ADLS  PERSONAL FACTORS: Age, Fitness, Past/current experiences, Time since onset of injury/illness/exacerbation, Transportation, and 3+ comorbidities: Chronic LBP with h/o surgery for "ruptured disc", lumbar spinal stenosis, OA, B TKA, L RTC repair, chronic diastolic HF, HLD, moderate asthma, DOE, HTN, breast cancer, benign essential tremor, morbid obesity     are also affecting patient's functional outcome.   REHAB POTENTIAL: Good  CLINICAL DECISION MAKING: Stable/uncomplicated  EVALUATION COMPLEXITY: Low   GOALS: Goals reviewed with patient? Yes  SHORT TERM GOALS: Target date: 12/15/2021  Patient will be independent with initial HEP.  Baseline:  Goal status: MET 12/14/21 Pt reports she can complete HEP with written instruction.    LONG TERM GOALS: Target date: 01/12/2022 (Remove Blue Hyperlink)  Patient will be independent with advanced/ongoing HEP to improve outcomes and carryover.  Baseline:  Goal status: IN PROGRESS  2.  Patient will report 50-75% improvement in neck pain to improve QOL.  Baseline:  Goal status: IN PROGRESS  3.  Patient will demonstrate improved R cervical ROM by at least 5 deg with minimal pain for improved ease of ADLS.  Baseline:  Goal status: IN PROGRESS  4.  Patient will report <= 11% on NDI  to demonstrate improved functional ability.  Baseline: 26% Goal status: IN PROGRESS  5.  Patient will be able to pull up her pants without difficulty. Baseline:  Goal status: IN  PROGRESS  6. Patient will be able to hang clothes using her R UE independently   Baseline:  Goal status: IN PROGRESS   7. Patient will be able to reach into her second shelf cabinets without difficulty Baseline:  Goal status: IN PROGRESS    PLAN:  PT FREQUENCY: 2x/week  PT DURATION: 6 weeks  PLANNED INTERVENTIONS: Therapeutic exercises, Therapeutic activity, Neuromuscular re-education, Patient/Family education, Self Care, Joint mobilization, Dry Needling, Electrical stimulation, Spinal mobilization, Cryotherapy, Moist heat, Taping, Traction, Ultrasound, Ionotophoresis 49m/ml Dexamethasone, Manual therapy, and Re-evaluation  PLAN FOR NEXT SESSION: progress HEP for neck, upper back and functional shoulder strengthening, progress shoulder ROM & strengthening,  manual/DN to suboccipitals and cspine, spinal mobs prn.  OZeb Comfort Student-PT 12/14/2021, 11:08 AM

## 2021-12-15 DIAGNOSIS — D044 Carcinoma in situ of skin of scalp and neck: Secondary | ICD-10-CM | POA: Diagnosis not present

## 2021-12-15 DIAGNOSIS — L570 Actinic keratosis: Secondary | ICD-10-CM | POA: Diagnosis not present

## 2021-12-17 ENCOUNTER — Ambulatory Visit: Payer: Medicare Other

## 2021-12-17 DIAGNOSIS — M25611 Stiffness of right shoulder, not elsewhere classified: Secondary | ICD-10-CM | POA: Diagnosis not present

## 2021-12-17 DIAGNOSIS — M6281 Muscle weakness (generalized): Secondary | ICD-10-CM | POA: Diagnosis not present

## 2021-12-17 DIAGNOSIS — R252 Cramp and spasm: Secondary | ICD-10-CM

## 2021-12-17 DIAGNOSIS — M542 Cervicalgia: Secondary | ICD-10-CM | POA: Diagnosis not present

## 2021-12-17 NOTE — Telephone Encounter (Signed)
Prolia VOB initiated via [email or web address]  Last Prolia inj 07/27/21 Next Prolia inj due 01/27/22

## 2021-12-17 NOTE — Therapy (Signed)
OUTPATIENT PHYSICAL THERAPY TREATMENT   Patient Name: Amanda Copeland MRN: [id number] DOB:[birth year], 81 y.o., female Today's Date: 12/17/2021   PT End of Session - 12/17/21 1105     Visit Number 5    Date for PT Re-Evaluation 01/12/22    Authorization Type Medicare & BCBS    Progress Note Due on Visit 10    PT Start Time 1019    PT Stop Time 1101    PT Time Calculation (min) 42 min    Activity Tolerance Patient tolerated treatment well    Behavior During Therapy WFL for tasks assessed/performed              Past Medical History:  Diagnosis Date   Arthritis    Asthma    childhood now returning   Back pain affecting pregnancy 11/02/2014   Benign essential tremor 10/28/2014   Bilateral dry eyes    Breast cancer ([place])    right   Cancer ([place])    Breast   Dry eyes    Encounter for Medicare annual wellness exam 05/22/2016   Essential hypertension, benign 10/28/2014   H/O measles    H/O mumps    History of chicken pox    Lumbago 11/02/2014   Mixed hyperlipidemia 10/28/2014   Obesity 10/28/2014   Stenosis of cervical spine    Past Surgical History:  Procedure Laterality Date   BACK SURGERY     BREAST REDUCTION SURGERY Left    CHOLECYSTECTOMY     EYE SURGERY  2008   b/l cataracts removed, in [place] Bilateral    ROTATOR CUFF REPAIR Left    Patient Active Problem List   Diagnosis Date Noted   Osteoporosis 05/25/2021   Scapular dysfunction 03/17/2021   Compression fracture of T11 vertebra ([place]) 01/20/2021   SI joint arthritis 01/20/2021   Vitamin D deficiency 01/20/2021   Age-related osteoporosis with current pathological fracture 11/26/2020   Pain of right upper extremity 11/11/2020   Bradycardia 11/11/2020   Neck pain 03/11/2020   Tremor 03/01/2020   Hypothyroid 03/01/2020   Physical deconditioning 11/25/2019   At risk for obstructive sleep apnea 11/25/2019   Primary  osteoarthritis of right shoulder 02/28/2019   Other forms of dyspnea 11/22/2018   Educated about COVID-19 virus infection 07/03/2018   Greater trochanteric pain syndrome of right lower extremity 03/18/2018   Chronic nonallergic rhinitis 12/20/2017   Cough 12/20/2017   Right flank pain 12/10/2017   Fatigue 07/12/2017   Morbid obesity ([place]) 03/05/2017   Chronic diastolic heart failure ([place]) 11/29/2016   Dyspnea on exertion 10/06/2016   Asthma 10/06/2016   Encounter for Medicare annual wellness exam 05/22/2016   Hyperglycemia 08/07/2015   Allergic 08/07/2015   Gout 08/07/2015   Leukocytosis 08/07/2015   Lower abdominal pain 04/21/2015   Low back pain 11/02/2014   Esophageal reflux 10/28/2014   Essential hypertension, benign 10/28/2014   Hyperlipidemia 10/28/2014   Benign essential tremor 10/28/2014   Bilateral dry eyes    Moderate persistent asthma    Arthritis    Cancer ([place])    History of chicken pox     PCP: [name], MD  REFERRING PROVIDER: Kary Kos, MD  REFERRING DIAG: M54.2 (ICD-10-CM) - Cervicalgia  THERAPY DIAG:  Cervicalgia  Stiffness of right shoulder, not elsewhere classified  Muscle weakness (generalized)  Cramp and spasm  Rationale for Evaluation and Treatment Rehabilitation  ONSET DATE:  2 weeks ago  SUBJECTIVE:                                                                                                                                                                                                         SUBJECTIVE STATEMENT:  Pt continues to note pain in upper arm.  PERTINENT HISTORY:  Cervical radiculopathy R, Chronic LBP with h/o surgery for "ruptured disc", lumbar spinal stenosis, OA, B TKA, L RTC repair, chronic diastolic HF, HLD, moderate asthma, DOE, HTN, breast cancer, benign essential tremor, morbid obesity   PAIN:  Are you having pain? Yes: NPRS scale: 5/10 Pain location: right shoulder Pain description: pull Aggravating  factors: turning head to right Relieving factors: Advil    PRECAUTIONS: None  WEIGHT BEARING RESTRICTIONS: No  FALLS:  Has patient fallen in last 6 months? No  LIVING ENVIRONMENT: Lives with: lives with their spouse Lives in: House/apartment Stairs: Yes: External: 1 steps; over threshold Has following equipment at home: Single point cane, Walker - 2 wheeled, Environmental consultant - 4 wheeled, Wheelchair (manual), shower chair, and bed side commode  OCCUPATION: Retired  PLOF: Independent  PATIENT GOALS: to strengthen my neck, to improve arm function  OBJECTIVE:   DIAGNOSTIC FINDINGS:   Cervical MRI - 07/07/21: 1. Degenerative changes of the cervical spine with moderate spinal canal stenosis at C5-6 and C6-7 with mild mass effect on the cord at these levels without cord signal abnormality.  2. Multilevel high-grade neural foraminal narrowing, as described above, severe bilaterally at C5-6.  PATIENT SURVEYS:  NDI 13/50 = 26% disability  COGNITION: Overall cognitive status: Within functional limits for tasks assessed  SENSATION: WFL  POSTURE: rounded shoulders, forward head, and increased thoracic kyphosis  PALPATION: Bil suboccipitals R>L, R cervical and UT tenderness   CERVICAL ROM:   Active ROM A/PROM (deg) eval  Flexion full  Extension full  Right lateral flexion WFL  Left lateral flexion WFL  Right rotation 40*  Left rotation 50   (Blank rows = not tested)  UPPER EXTREMITY ROM:   ROM A/P Right eval Left eval  Shoulder flexion 133/ 140   Shoulder extension WNL   Shoulder abduction 96/104   Shoulder internal rotation 60/72   Shoulder external rotation 36/47    (Blank rows = not tested)  UPPER EXTREMITY MMT:   MMT Right eval Left eval  Shoulder flexion 4 with audible popping 4+  Shoulder extension 4- 4-  Shoulder abduction 4+ 4+  Shoulder internal rotation 5 4+  Shoulder external rotation 4+ with audible popping 4-  Middle trapezius  Lower trapezius      (Blank rows = not tested)   CERVICAL MMT: 4+/5 to 5/5 except extension 4/5  CERVICAL SPECIAL TESTS:  Spurling's test: Negative and Distraction test: Negative Negative Michel Bickers and mild impingement sign R shoulder    TODAY'S TREATMENT:                                                                                                                      DATE:  12/17/21 THERAPEUTIC EXERCISE: to improve flexibility, strength and mobility.  Verbal and tactile cues throughout for technique.  UBE L1.0 5 min fwd/ 1 min back ( increased pain going back) Supine shoulder flexion to ~80  deg to avoid crepitus Supine shoulder flexion 80 deg CW/CCW circles x 10 each Supine shoulder ER with wand R shoulder x 10 Supine chin tucks 10x  Standing row red TB x 20  Standing shoulder extension 2x10 red TB Standing scap retraction + ER at doorframe 2x10   12/14/21 THERAPEUTIC EXERCISE: to improve flexibility, strength and mobility.  Verbal and tactile cues throughout for technique.  Pulleys flexion x3 min, scaption x3 min Seated UT stretch R & L 2x30 sec hold Seated Cer Retraction with RTB 2x10 with 5 sec hold Standing scap retraction at doorframe with 5 sec hold 2x10- cues to decrease shoulder elevation Standing Rows in door with RTB 2x10- verbal and tactile cues to increase scap retraction Standing Scap in door with elbows extended and RTB 2x10 Seated Forward Shoulder Flexion AROM 1x5- limited to pain Seated Forward Shoulder Flexion AAROM with cane 1x10- cues for scap retraction and upright posture Seated Scapular Plane Shoulder ABD AAROM with cane 1x10  MANUAL THERAPY: To promote normalized muscle tension and reduced pain.  STM and TrP release to ant deltoid   12/08/21 THERAPEUTIC EXERCISE: to improve flexibility, strength and mobility.  Verbal and tactile cues throughout for technique. Pulleys flexion x 3 min, scaption x 3 min Seated cerv rotation L & R with 5 sec hold x 10 each- cues  to for postural correction and retraction of soulders- pain to the R side Seated cerv retraction 1x10 Seated cerv retraction red TB 2x10 Seated scap retraction + 90 deg shoulder flex with pool noodle for tactile cues 2x10- cueing for retraction throughout motion Seated scap retraction + elbow 90 deg ER with pool noddle for tactile cues x10 Thoracic extension against chair with pool noodle for tactile cues 3x10- cueing to rotate palms anterior to increase extension- reduced extension motion due to increased pain in R shoulder Cervical Extension SNAG with pillowcase 10x 5 sec holds  MANUAL THERAPY: To promote improved flexibility, improved joint mobility, increased ROM, and reduced pain.  Seated scap retraction with manual shoulder distraction   12/06/21 Pulleys flexion x 3 min  Seated retraction x 10 cues to keep head level Seated cerv rotation L 5 sec hold x 10 ea; pain with R  Supine: cerv rotation R 5 sec hold x 10 (no pain) Chin tucks x 10  Shoulder flexion with cane x 10  Manual Therapy: Skilled palpation and monitoring of soft tissues during DN Performed in sitting: Trigger Point Dry-Needling  Treatment instructions: Expect mild to moderate muscle soreness. S/S of pneumothorax if dry needled over a lung field, and to seek immediate medical attention should they occur. Patient verbalized understanding of these instructions and education. Patient Consent Given: Yes Education handout provided: Previously provided Muscles treated: R cervical multifidi and UT Electrical stimulation performed: No Parameters: N/A Treatment response/outcome: Twitch Response Elicited and Palpable Increase in Muscle Length  Modalities: Ionotophoresis with 1.0 mL 61m/ml Dexamethasone - 4-6 hour patch (888mmin) to right anterior shoulder (patch #1 of 6)    12/01/21 See Pt Ed  PATIENT EDUCATION:  Education details: HEP progression - AAROM shoulder flexion and scap plane with cane, postural  awareness, and posture and body mechanics for typical daily postioning, mobility and household tasks  Person educated: Patient Education method: ExConsulting civil engineerDemonstration, Verbal cues, and Handouts Education comprehension: verbalized understanding and returned demonstration  HOME EXERCISE PROGRAM: Access Code: [email or web address] URL: [email or web address] Date: 12/14/2021 Prepared by: OlZeb ComfortExercises - Doorway Pec Stretch at 60 Degrees Abduction with Arm Straight  - 2-3 x daily - 7 x weekly - 3 reps - 30 sec hold - Standing Bilateral Low Shoulder Row with Anchored Resistance  - 1 x daily - 3 x weekly - 2 sets - 10 reps - 5 sec hold - Scapular Retraction with Resistance Advanced  - 1 x daily - 3 x weekly - 2 sets - 10 reps - 5 sec hold - Shoulder External Rotation and Scapular Retraction with Resistance  - 1 x daily - 3 x weekly - 3 sets - 10 reps - Standing Shoulder Flexion to 90 Degrees with Dumbbells  - 1 x daily - 3 x weekly - 3 sets - 10 reps - Seated Gentle Upper Trapezius Stretch  - 2-3 x daily - 7 x weekly - 3 reps - 30 sec hold - Gentle Levator Scapulae Stretch  - 2-3 x daily - 7 x weekly - 3 reps - 30 sec hold - Seated Assisted Cervical Rotation with Towel  - 1 x daily - 7 x weekly - 2 sets - 10 reps - 3 sec hold - Mid-Lower Cervical Extension SNAG with Strap  - 1 x daily - 7 x weekly - 2 sets - 10 reps - 3 sec hold - Supine Chin Tuck  - 1 x daily - 7 x weekly - 3 sets - 10 reps - Supine Shoulder Flexion Extension AAROM with Dowel  - 2 x daily - 7 x weekly - 2 sets - 10 reps - Seated Cervical Rotation AROM  - 2 x daily - 7 x weekly - 1 sets - 10 reps - 5 sec hold - Standing Shoulder Extension with Dowel  - 2 x daily - 7 x weekly - 2 sets - 10 reps - Seated Thoracic Lumbar Extension  - 1 x daily - 7 x weekly - 3 sets - 10 reps - 5 hold - Seated Shoulder Flexion  - 1 x daily - 7 x weekly - 3 sets - 10 reps - Seated Shoulder Flexion AAROM with Dowel  - 1 x daily - 7 x  weekly - 2 sets - 10 reps - Seated Shoulder Abduction AAROM with Dowel  - 1 x daily - 7 x weekly - 2 sets - 10 reps  ASSESSMENT:  CLINICAL IMPRESSION: Pt continues to note pain in R anterior  shoulder with movement. Adjusted UBE warm up to mostly fwd motion d/t increased pain going backward. Tried to progress AROM in supine, d/t crepitus in R shoulder pt was told to keep ROM to ~ 80 deg. Continued working on scapular stabilization with good response shown by patient. Will continue progressing exercises to address functional impairments.   OBJECTIVE IMPAIRMENTS: decreased ROM, decreased strength, increased muscle spasms, impaired flexibility, impaired UE functional use, postural dysfunction, obesity, and pain.   ACTIVITY LIMITATIONS: dressing and reach over head  PARTICIPATION LIMITATIONS:  OH ADLS  PERSONAL FACTORS: Age, Fitness, Past/current experiences, Time since onset of injury/illness/exacerbation, Transportation, and 3+ comorbidities: Chronic LBP with h/o surgery for "ruptured disc", lumbar spinal stenosis, OA, B TKA, L RTC repair, chronic diastolic HF, HLD, moderate asthma, DOE, HTN, breast cancer, benign essential tremor, morbid obesity     are also affecting patient's functional outcome.   REHAB POTENTIAL: Good  CLINICAL DECISION MAKING: Stable/uncomplicated  EVALUATION COMPLEXITY: Low   GOALS: Goals reviewed with patient? Yes  SHORT TERM GOALS: Target date: 12/15/2021  Patient will be independent with initial HEP.  Baseline:  Goal status: MET 12/14/21 Pt reports she can complete HEP with written instruction.    LONG TERM GOALS: Target date: 01/12/2022 (Remove Blue Hyperlink)  Patient will be independent with advanced/ongoing HEP to improve outcomes and carryover.  Baseline:  Goal status: IN PROGRESS  2.  Patient will report 50-75% improvement in neck pain to improve QOL.  Baseline:  Goal status: IN PROGRESS  3.  Patient will demonstrate improved R cervical ROM by at  least 5 deg with minimal pain for improved ease of ADLS.  Baseline:  Goal status: IN PROGRESS  4.  Patient will report <= 11% on NDI  to demonstrate improved functional ability.  Baseline: 26% Goal status: IN PROGRESS  5.  Patient will be able to pull up her pants without difficulty. Baseline:  Goal status: IN PROGRESS  6. Patient will be able to hang clothes using her R UE independently   Baseline:  Goal status: IN PROGRESS   7. Patient will be able to reach into her second shelf cabinets without difficulty Baseline:  Goal status: IN PROGRESS    PLAN:  PT FREQUENCY: 2x/week  PT DURATION: 6 weeks  PLANNED INTERVENTIONS: Therapeutic exercises, Therapeutic activity, Neuromuscular re-education, Patient/Family education, Self Care, Joint mobilization, Dry Needling, Electrical stimulation, Spinal mobilization, Cryotherapy, Moist heat, Taping, Traction, Ultrasound, Ionotophoresis 5m/ml Dexamethasone, Manual therapy, and Re-evaluation  PLAN FOR NEXT SESSION: progress HEP for neck, upper back and functional shoulder strengthening, progress shoulder ROM & strengthening,  manual/DN to suboccipitals and cspine, spinal mobs prn.  BArtist Pais PTA 12/17/2021, 11:07 AM

## 2021-12-20 ENCOUNTER — Encounter: Payer: Medicare Other | Admitting: Physical Therapy

## 2021-12-27 ENCOUNTER — Encounter: Payer: Self-pay | Admitting: Physical Therapy

## 2021-12-27 ENCOUNTER — Ambulatory Visit: Payer: Medicare Other | Admitting: Physical Therapy

## 2021-12-27 DIAGNOSIS — M6281 Muscle weakness (generalized): Secondary | ICD-10-CM

## 2021-12-27 DIAGNOSIS — R252 Cramp and spasm: Secondary | ICD-10-CM

## 2021-12-27 DIAGNOSIS — M25611 Stiffness of right shoulder, not elsewhere classified: Secondary | ICD-10-CM

## 2021-12-27 DIAGNOSIS — M542 Cervicalgia: Secondary | ICD-10-CM

## 2021-12-27 NOTE — Therapy (Signed)
OUTPATIENT PHYSICAL THERAPY TREATMENT   Patient Name: Amanda Copeland MRN: [id number] DOB:[birth year], 81 y.o., female Today's Date: 12/27/2021   PT End of Session - 12/27/21 0928     Visit Number 6    Date for PT Re-Evaluation 01/12/22    Authorization Type Medicare & BCBS    Progress Note Due on Visit 10    PT Start Time 0928    PT Stop Time 1015    PT Time Calculation (min) 47 min    Activity Tolerance Patient tolerated treatment well    Behavior During Therapy [facility] for tasks assessed/performed               Past Medical History:  Diagnosis Date   Arthritis    Asthma    childhood now returning   Back pain affecting pregnancy 11/02/2014   Benign essential tremor 10/28/2014   Bilateral dry eyes    Breast cancer ([place])    right   Cancer ([place])    Breast   Dry eyes    Encounter for Medicare annual wellness exam 05/22/2016   Essential hypertension, benign 10/28/2014   H/O measles    H/O mumps    History of chicken pox    Lumbago 11/02/2014   Mixed hyperlipidemia 10/28/2014   Obesity 10/28/2014   Stenosis of cervical spine    Past Surgical History:  Procedure Laterality Date   BACK SURGERY     BREAST REDUCTION SURGERY Left    CHOLECYSTECTOMY     EYE SURGERY  2008   b/l cataracts removed, in [place] Bilateral    ROTATOR CUFF REPAIR Left    Patient Active Problem List   Diagnosis Date Noted   Osteoporosis 05/25/2021   Scapular dysfunction 03/17/2021   Compression fracture of T11 vertebra ([place]) 01/20/2021   SI joint arthritis 01/20/2021   Vitamin D deficiency 01/20/2021   Age-related osteoporosis with current pathological fracture 11/26/2020   Pain of right upper extremity 11/11/2020   Bradycardia 11/11/2020   Neck pain 03/11/2020   Tremor 03/01/2020   Hypothyroid 03/01/2020   Physical deconditioning 11/25/2019   At risk for obstructive sleep apnea 11/25/2019   Primary  osteoarthritis of right shoulder 02/28/2019   Other forms of dyspnea 11/22/2018   Educated about COVID-19 virus infection 07/03/2018   Greater trochanteric pain syndrome of right lower extremity 03/18/2018   Chronic nonallergic rhinitis 12/20/2017   Cough 12/20/2017   Right flank pain 12/10/2017   Fatigue 07/12/2017   Morbid obesity ([place]) 03/05/2017   Chronic diastolic heart failure ([place]) 11/29/2016   Dyspnea on exertion 10/06/2016   Asthma 10/06/2016   Encounter for Medicare annual wellness exam 05/22/2016   Hyperglycemia 08/07/2015   Allergic 08/07/2015   Gout 08/07/2015   Leukocytosis 08/07/2015   Lower abdominal pain 04/21/2015   Low back pain 11/02/2014   Esophageal reflux 10/28/2014   Essential hypertension, benign 10/28/2014   Hyperlipidemia 10/28/2014   Benign essential tremor 10/28/2014   Bilateral dry eyes    Moderate persistent asthma    Arthritis    Cancer ([place])    History of chicken pox     PCP: [name], MD  REFERRING PROVIDER: Kary Kos, MD  REFERRING DIAG: M54.2 (ICD-10-CM) - Cervicalgia  THERAPY DIAG:  Cervicalgia  Stiffness of right shoulder, not elsewhere classified  Muscle weakness (generalized)  Cramp and spasm  Rationale for Evaluation and Treatment Rehabilitation  ONSET  DATE: 2 weeks ago  SUBJECTIVE:                                                                                                                                                                                                         SUBJECTIVE STATEMENT:  Pt continues to note pain in upper arm and shoulder. She is also having LBP today.   PERTINENT HISTORY:  Cervical radiculopathy R, Chronic LBP with h/o surgery for "ruptured disc", lumbar spinal stenosis, OA, B TKA, L RTC repair, chronic diastolic HF, HLD, moderate asthma, DOE, HTN, breast cancer, benign essential tremor, morbid obesity   PAIN:  Are you having pain? Yes: NPRS scale: 10/10 Pain location: right  shoulder, arm Pain description: pull Aggravating factors: turning head to right Relieving factors: Advil  PRECAUTIONS: None  WEIGHT BEARING RESTRICTIONS: No  FALLS:  Has patient fallen in last 6 months? No  LIVING ENVIRONMENT: Lives with: lives with their spouse Lives in: House/apartment Stairs: Yes: External: 1 steps; over threshold Has following equipment at home: Single point cane, Walker - 2 wheeled, Environmental consultant - 4 wheeled, Wheelchair (manual), shower chair, and bed side commode  OCCUPATION: Retired  PLOF: Independent  PATIENT GOALS: to strengthen my neck, to improve arm function  OBJECTIVE:   DIAGNOSTIC FINDINGS:   Cervical MRI - 07/07/21: 1. Degenerative changes of the cervical spine with moderate spinal canal stenosis at C5-6 and C6-7 with mild mass effect on the cord at these levels without cord signal abnormality.  2. Multilevel high-grade neural foraminal narrowing, as described above, severe bilaterally at C5-6.  PATIENT SURVEYS:  NDI 13/50 = 26% disability  COGNITION: Overall cognitive status: Within functional limits for tasks assessed  SENSATION: WFL  POSTURE: rounded shoulders, forward head, and increased thoracic kyphosis  PALPATION: Bil suboccipitals R>L, R cervical and UT tenderness   CERVICAL ROM:   Active ROM A/PROM (deg) eval  Flexion full  Extension full  Right lateral flexion WFL  Left lateral flexion WFL  Right rotation 40*  Left rotation 50   (Blank rows = not tested)  UPPER EXTREMITY ROM:   ROM A/P Right eval Left eval  Shoulder flexion 133/ 140   Shoulder extension WNL   Shoulder abduction 96/104   Shoulder internal rotation 60/72   Shoulder external rotation 36/47    (Blank rows = not tested)  UPPER EXTREMITY MMT:   MMT Right eval Left eval  Shoulder flexion 4 with audible popping 4+  Shoulder extension 4- 4-  Shoulder abduction 4+ 4+  Shoulder internal rotation 5 4+  Shoulder external  rotation 4+ with audible  popping 4-  Middle trapezius    Lower trapezius     (Blank rows = not tested)   CERVICAL MMT: 4+/5 to 5/5 except extension 4/5  CERVICAL SPECIAL TESTS:  Spurling's test: Negative and Distraction test: Negative Negative Michel Bickers and mild impingement sign R shoulder    TODAY'S TREATMENT:                                                                                                                      DATE:   12/27/21 THERAPEUTIC EXERCISE: to improve flexibility, strength and mobility.  Verbal and tactile cues throughout for technique. UBE L1 3 min fwd, 3 min back Seated R scalene bracing onto the table, 2x30 sec hold- cues for upright posture  Seated R UT Stretch bracing onto the table 2x30 sec hold- cues for upright posture Seated Cervical AROM Alt Flexion + Extension 1x10 Seated Cervical AROM SB Alt R/L 1x10 Seated Cervical AROM Rotation Alt R/L 1x10 Seated Thoracic Extension sitting on airex with pool noodle between shoulder blades for tactile cues x15-  cues for shoulder depression during movement Seated Thoracic Extension + AAROM Shoulder Flexion 0-90 degrees with cane x20- pool noodle for tactile cues for scapular retraction- limited to 90 degrees due to pain Seated Thoracic Extension + AAROM R Shoulder ER with cane to tolerance x20  MANUAL THERAPY: To promote normalized muscle tension, increased ROM, and reduced pain. STM and TrP release of taut bands in R pec muscles and UT  12/17/21 THERAPEUTIC EXERCISE: to improve flexibility, strength and mobility.  Verbal and tactile cues throughout for technique.  UBE L1.0 5 min fwd/ 1 min back ( increased pain going back) Supine shoulder flexion to ~80  deg to avoid crepitus Supine shoulder flexion 80 deg CW/CCW circles x 10 each Supine shoulder ER with wand R shoulder x 10 Supine chin tucks 10x  Standing row red TB x 20  Standing shoulder extension 2x10 red TB Standing scap retraction + ER at doorframe  2x10   12/14/21 THERAPEUTIC EXERCISE: to improve flexibility, strength and mobility.  Verbal and tactile cues throughout for technique.  Pulleys flexion x3 min, scaption x3 min Seated UT stretch R & L 2x30 sec hold Seated Cer Retraction with RTB 2x10 with 5 sec hold Standing scap retraction at doorframe with 5 sec hold 2x10- cues to decrease shoulder elevation Standing Rows in door with RTB 2x10- verbal and tactile cues to increase scap retraction Standing Scap in door with elbows extended and RTB 2x10 Seated Forward Shoulder Flexion AROM 1x5- limited to pain Seated Forward Shoulder Flexion AAROM with cane 1x10- cues for scap retraction and upright posture Seated Scapular Plane Shoulder ABD AAROM with cane 1x10  MANUAL THERAPY: To promote normalized muscle tension and reduced pain.  STM and TrP release to ant deltoid   PATIENT EDUCATION:  Education details: HEP review, postural awareness, and posture and body mechanics for typical daily postioning, mobility and household tasks  Person educated: Patient  Education method: Explanation, Demonstration, Verbal cues, and Handouts Education comprehension: verbalized understanding and returned demonstration  HOME EXERCISE PROGRAM: Access Code: [email or web address] URL: [email or web address] Date: 12/14/2021 Prepared by: Zeb Comfort  Exercises - Doorway Pec Stretch at 60 Degrees Abduction with Arm Straight  - 2-3 x daily - 7 x weekly - 3 reps - 30 sec hold - Standing Bilateral Low Shoulder Row with Anchored Resistance  - 1 x daily - 3 x weekly - 2 sets - 10 reps - 5 sec hold - Scapular Retraction with Resistance Advanced  - 1 x daily - 3 x weekly - 2 sets - 10 reps - 5 sec hold - Shoulder External Rotation and Scapular Retraction with Resistance  - 1 x daily - 3 x weekly - 3 sets - 10 reps - Standing Shoulder Flexion to 90 Degrees with Dumbbells  - 1 x daily - 3 x weekly - 3 sets - 10 reps - Seated Gentle Upper Trapezius Stretch  - 2-3  x daily - 7 x weekly - 3 reps - 30 sec hold - Gentle Levator Scapulae Stretch  - 2-3 x daily - 7 x weekly - 3 reps - 30 sec hold - Seated Assisted Cervical Rotation with Towel  - 1 x daily - 7 x weekly - 2 sets - 10 reps - 3 sec hold - Mid-Lower Cervical Extension SNAG with Strap  - 1 x daily - 7 x weekly - 2 sets - 10 reps - 3 sec hold - Supine Chin Tuck  - 1 x daily - 7 x weekly - 3 sets - 10 reps - Supine Shoulder Flexion Extension AAROM with Dowel  - 2 x daily - 7 x weekly - 2 sets - 10 reps - Seated Cervical Rotation AROM  - 2 x daily - 7 x weekly - 1 sets - 10 reps - 5 sec hold - Standing Shoulder Extension with Dowel  - 2 x daily - 7 x weekly - 2 sets - 10 reps - Seated Thoracic Lumbar Extension  - 1 x daily - 7 x weekly - 3 sets - 10 reps - 5 hold - Seated Shoulder Flexion  - 1 x daily - 7 x weekly - 3 sets - 10 reps - Seated Shoulder Flexion AAROM with Dowel  - 1 x daily - 7 x weekly - 2 sets - 10 reps - Seated Shoulder Abduction AAROM with Dowel  - 1 x daily - 7 x weekly - 2 sets - 10 reps  ASSESSMENT:  CLINICAL IMPRESSION: [name] presented today with increased R shoulder pain and complaints of LBP. She traveled this weekend and was in the car for extended periods of time and thinks that it is related to her travels. This session focused on MT to reduce TrP and tightness in anterior shoulder muscles and TE for gentle ROM to reduce pain and improve posture. She still needs cueing for correction of posture and scapular retraction and will benefit from continued therapy to address limitations.   OBJECTIVE IMPAIRMENTS: decreased ROM, decreased strength, increased muscle spasms, impaired flexibility, impaired UE functional use, postural dysfunction, obesity, and pain.   ACTIVITY LIMITATIONS: dressing and reach over head  PARTICIPATION LIMITATIONS:  OH ADLS  PERSONAL FACTORS: Age, Fitness, Past/current experiences, Time since onset of injury/illness/exacerbation, Transportation, and 3+  comorbidities: Chronic LBP with h/o surgery for "ruptured disc", lumbar spinal stenosis, OA, B TKA, L RTC repair, chronic diastolic HF, HLD, moderate asthma, DOE, HTN, breast cancer, benign essential tremor, morbid obesity  are also affecting patient's functional outcome.   REHAB POTENTIAL: Good  CLINICAL DECISION MAKING: Stable/uncomplicated  EVALUATION COMPLEXITY: Low   GOALS: Goals reviewed with patient? Yes  SHORT TERM GOALS: Target date: 12/15/2021  Patient will be independent with initial HEP.  Baseline:  Goal status: MET 12/14/21 Pt reports she can complete HEP with written instruction.    LONG TERM GOALS: Target date: 01/12/2022 (Remove Blue Hyperlink)  Patient will be independent with advanced/ongoing HEP to improve outcomes and carryover.  Baseline:  Goal status: IN PROGRESS  2.  Patient will report 50-75% improvement in neck pain to improve QOL.  Baseline:  Goal status: IN PROGRESS  3.  Patient will demonstrate improved R cervical ROM by at least 5 deg with minimal pain for improved ease of ADLS.  Baseline:  Goal status: IN PROGRESS  4.  Patient will report <= 11% on NDI  to demonstrate improved functional ability.  Baseline: 26% Goal status: IN PROGRESS  5.  Patient will be able to pull up her pants without difficulty. Baseline:  Goal status: IN PROGRESS  6. Patient will be able to hang clothes using her R UE independently   Baseline:  Goal status: IN PROGRESS   7. Patient will be able to reach into her second shelf cabinets without difficulty Baseline:  Goal status: IN PROGRESS    PLAN:  PT FREQUENCY: 2x/week  PT DURATION: 6 weeks  PLANNED INTERVENTIONS: Therapeutic exercises, Therapeutic activity, Neuromuscular re-education, Patient/Family education, Self Care, Joint mobilization, Dry Needling, Electrical stimulation, Spinal mobilization, Cryotherapy, Moist heat, Taping, Traction, Ultrasound, Ionotophoresis 51m/ml Dexamethasone, Manual therapy,  and Re-evaluation  PLAN FOR NEXT SESSION: progress HEP for neck, upper back and functional shoulder strengthening, progress shoulder ROM & strengthening, ionto patch prm, manual/DN to suboccipitals and cspine, spinal mobs prn.  OZeb Comfort Student-PT 12/27/2021, 10:17 AM

## 2022-01-05 ENCOUNTER — Ambulatory Visit: Payer: Medicare Other

## 2022-01-05 DIAGNOSIS — R252 Cramp and spasm: Secondary | ICD-10-CM | POA: Diagnosis not present

## 2022-01-05 DIAGNOSIS — M6281 Muscle weakness (generalized): Secondary | ICD-10-CM

## 2022-01-05 DIAGNOSIS — M542 Cervicalgia: Secondary | ICD-10-CM | POA: Diagnosis not present

## 2022-01-05 DIAGNOSIS — M25611 Stiffness of right shoulder, not elsewhere classified: Secondary | ICD-10-CM

## 2022-01-05 NOTE — Therapy (Signed)
OUTPATIENT PHYSICAL THERAPY TREATMENT   Patient Name: Amanda Copeland MRN: [id number] DOB:12-18-1940, 81 y.o., female Today's Date: 01/05/2022   PT End of Session - 01/05/22 1055     Visit Number 7    Date for PT Re-Evaluation 01/12/22    Authorization Type Medicare & BCBS    Progress Note Due on Visit 10    PT Start Time 0933    PT Stop Time 1013    PT Time Calculation (min) 40 min    Activity Tolerance Patient tolerated treatment well    Behavior During Therapy [facility] for tasks assessed/performed                Past Medical History:  Diagnosis Date   Arthritis    Asthma    childhood now returning   Back pain affecting pregnancy 11/02/2014   Benign essential tremor 10/28/2014   Bilateral dry eyes    Breast cancer ([place])    right   Cancer ([place])    Breast   Dry eyes    Encounter for Medicare annual wellness exam 05/22/2016   Essential hypertension, benign 10/28/2014   H/O measles    H/O mumps    History of chicken pox    Lumbago 11/02/2014   Mixed hyperlipidemia 10/28/2014   Obesity 10/28/2014   Stenosis of cervical spine    Past Surgical History:  Procedure Laterality Date   BACK SURGERY     BREAST REDUCTION SURGERY Left    CHOLECYSTECTOMY     EYE SURGERY  2008   b/l cataracts removed, in [place] Bilateral    ROTATOR CUFF REPAIR Left    Patient Active Problem List   Diagnosis Date Noted   Osteoporosis 05/25/2021   Scapular dysfunction 03/17/2021   Compression fracture of T11 vertebra ([place]) 01/20/2021   SI joint arthritis 01/20/2021   Vitamin D deficiency 01/20/2021   Age-related osteoporosis with current pathological fracture 11/26/2020   Pain of right upper extremity 11/11/2020   Bradycardia 11/11/2020   Neck pain 03/11/2020   Tremor 03/01/2020   Hypothyroid 03/01/2020   Physical deconditioning 11/25/2019   At risk for obstructive sleep apnea 11/25/2019   Primary  osteoarthritis of right shoulder 02/28/2019   Other forms of dyspnea 11/22/2018   Educated about COVID-19 virus infection 07/03/2018   Greater trochanteric pain syndrome of right lower extremity 03/18/2018   Chronic nonallergic rhinitis 12/20/2017   Cough 12/20/2017   Right flank pain 12/10/2017   Fatigue 07/12/2017   Morbid obesity ([place]) 03/05/2017   Chronic diastolic heart failure ([place]) 11/29/2016   Dyspnea on exertion 10/06/2016   Asthma 10/06/2016   Encounter for Medicare annual wellness exam 05/22/2016   Hyperglycemia 08/07/2015   Allergic 08/07/2015   Gout 08/07/2015   Leukocytosis 08/07/2015   Lower abdominal pain 04/21/2015   Low back pain 11/02/2014   Esophageal reflux 10/28/2014   Essential hypertension, benign 10/28/2014   Hyperlipidemia 10/28/2014   Benign essential tremor 10/28/2014   Bilateral dry eyes    Moderate persistent asthma    Arthritis    Cancer ([place])    History of chicken pox     PCP: [name], MD  REFERRING PROVIDER: Kary Kos, MD  REFERRING DIAG: M54.2 (ICD-10-CM) - Cervicalgia  THERAPY DIAG:  Cervicalgia  Stiffness of right shoulder, not elsewhere classified  Muscle weakness (generalized)  Cramp and spasm  Rationale for Evaluation and Treatment Rehabilitation  ONSET DATE: 2 weeks ago  SUBJECTIVE:                                                                                                                                                                                                         SUBJECTIVE STATEMENT:  The shoulder has not hurt as bad as it has been   PERTINENT HISTORY:  Cervical radiculopathy R, Chronic LBP with h/o surgery for "ruptured disc", lumbar spinal stenosis, OA, B TKA, L RTC repair, chronic diastolic HF, HLD, moderate asthma, DOE, HTN, breast cancer, benign essential tremor, morbid obesity   PAIN:  Are you having pain? Yes: NPRS scale: 5/10 Pain location: right shoulder, arm Pain description:  pull Aggravating factors: turning head to right Relieving factors: Advil  PRECAUTIONS: None  WEIGHT BEARING RESTRICTIONS: No  FALLS:  Has patient fallen in last 6 months? No  LIVING ENVIRONMENT: Lives with: lives with their spouse Lives in: House/apartment Stairs: Yes: External: 1 steps; over threshold Has following equipment at home: Single point cane, Walker - 2 wheeled, Walker - 4 wheeled, Wheelchair (manual), shower chair, and bed side commode  OCCUPATION: Retired  PLOF: Independent  PATIENT GOALS: to strengthen my neck, to improve arm function  OBJECTIVE:   DIAGNOSTIC FINDINGS:   Cervical MRI - 07/07/21: 1. Degenerative changes of the cervical spine with moderate spinal canal stenosis at C5-6 and C6-7 with mild mass effect on the cord at these levels without cord signal abnormality.  2. Multilevel high-grade neural foraminal narrowing, as described above, severe bilaterally at C5-6.  PATIENT SURVEYS:  NDI 13/50 = 26% disability  COGNITION: Overall cognitive status: Within functional limits for tasks assessed  SENSATION: WFL  POSTURE: rounded shoulders, forward head, and increased thoracic kyphosis  PALPATION: Bil suboccipitals R>L, R cervical and UT tenderness   CERVICAL ROM:   Active ROM A/PROM (deg) eval  Flexion full  Extension full  Right lateral flexion WFL  Left lateral flexion WFL  Right rotation 40*  Left rotation 50   (Blank rows = not tested)  UPPER EXTREMITY ROM:   ROM A/P Right eval Left eval  Shoulder flexion 133/ 140   Shoulder extension WNL   Shoulder abduction 96/104   Shoulder internal rotation 60/72   Shoulder external rotation 36/47    (Blank rows = not tested)  UPPER EXTREMITY MMT:   MMT Right eval Left eval  Shoulder flexion 4 with audible popping 4+  Shoulder extension 4- 4-  Shoulder abduction 4+ 4+  Shoulder internal rotation 5 4+  Shoulder external rotation 4+ with audible   popping 4-  Middle trapezius    Lower  trapezius     (Blank rows = not tested)   CERVICAL MMT: 4+/5 to 5/5 except extension 4/5  CERVICAL SPECIAL TESTS:  Spurling's test: Negative and Distraction test: Negative Negative Hawkins Kennedy and mild impingement sign R shoulder    TODAY'S TREATMENT:                                                                                                                      DATE:  12/27/21 THERAPEUTIC EXERCISE: to improve flexibility, strength and mobility.  Verbal and tactile cues throughout for technique. UBE L1 6 min Seated shoulder flexion with wand x 10  Seated chest press with wand x 10  Seated row 10# 2x10 Mid row with red TB x 10 Horiz ABD red TB x 10 Wall push ups x 10  Manual Therapy: to decrease muscle spasm and pain and improve mobility  STM to anterior deltoid, biceps  12/27/21 THERAPEUTIC EXERCISE: to improve flexibility, strength and mobility.  Verbal and tactile cues throughout for technique. UBE L1 3 min fwd, 3 min back Seated R scalene bracing onto the table, 2x30 sec hold- cues for upright posture  Seated R UT Stretch bracing onto the table 2x30 sec hold- cues for upright posture Seated Cervical AROM Alt Flexion + Extension 1x10 Seated Cervical AROM SB Alt R/L 1x10 Seated Cervical AROM Rotation Alt R/L 1x10 Seated Thoracic Extension sitting on airex with pool noodle between shoulder blades for tactile cues x15-  cues for shoulder depression during movement Seated Thoracic Extension + AAROM Shoulder Flexion 0-90 degrees with cane x20- pool noodle for tactile cues for scapular retraction- limited to 90 degrees due to pain Seated Thoracic Extension + AAROM R Shoulder ER with cane to tolerance x20  MANUAL THERAPY: To promote normalized muscle tension, increased ROM, and reduced pain. STM and TrP release of taut bands in R pec muscles and UT  12/17/21 THERAPEUTIC EXERCISE: to improve flexibility, strength and mobility.  Verbal and tactile cues throughout for  technique.  UBE L1.0 5 min fwd/ 1 min back ( increased pain going back) Supine shoulder flexion to ~80  deg to avoid crepitus Supine shoulder flexion 80 deg CW/CCW circles x 10 each Supine shoulder ER with wand R shoulder x 10 Supine chin tucks 10x  Standing row red TB x 20  Standing shoulder extension 2x10 red TB Standing scap retraction + ER at doorframe 2x10   12/14/21 THERAPEUTIC EXERCISE: to improve flexibility, strength and mobility.  Verbal and tactile cues throughout for technique.  Pulleys flexion x3 min, scaption x3 min Seated UT stretch R & L 2x30 sec hold Seated Cer Retraction with RTB 2x10 with 5 sec hold Standing scap retraction at doorframe with 5 sec hold 2x10- cues to decrease shoulder elevation Standing Rows in door with RTB 2x10- verbal and tactile cues to increase scap retraction Standing Scap in door with elbows extended and RTB 2x10 Seated Forward Shoulder Flexion AROM 1x5- limited   to pain Seated Forward Shoulder Flexion AAROM with cane 1x10- cues for scap retraction and upright posture Seated Scapular Plane Shoulder ABD AAROM with cane 1x10  MANUAL THERAPY: To promote normalized muscle tension and reduced pain.  STM and TrP release to ant deltoid   PATIENT EDUCATION:  Education details: HEP review, postural awareness, and posture and body mechanics for typical daily postioning, mobility and household tasks  Person educated: Patient Education method: Explanation, Demonstration, Verbal cues, and Handouts Education comprehension: verbalized understanding and returned demonstration  HOME EXERCISE PROGRAM: Access Code: [email or web address] URL: [email or web address] Date: 12/14/2021 Prepared by: Zeb Comfort  Exercises - Doorway Pec Stretch at 60 Degrees Abduction with Arm Straight  - 2-3 x daily - 7 x weekly - 3 reps - 30 sec hold - Standing Bilateral Low Shoulder Row with Anchored Resistance  - 1 x daily - 3 x weekly - 2 sets - 10 reps - 5 sec hold -  Scapular Retraction with Resistance Advanced  - 1 x daily - 3 x weekly - 2 sets - 10 reps - 5 sec hold - Shoulder External Rotation and Scapular Retraction with Resistance  - 1 x daily - 3 x weekly - 3 sets - 10 reps - Standing Shoulder Flexion to 90 Degrees with Dumbbells  - 1 x daily - 3 x weekly - 3 sets - 10 reps - Seated Gentle Upper Trapezius Stretch  - 2-3 x daily - 7 x weekly - 3 reps - 30 sec hold - Gentle Levator Scapulae Stretch  - 2-3 x daily - 7 x weekly - 3 reps - 30 sec hold - Seated Assisted Cervical Rotation with Towel  - 1 x daily - 7 x weekly - 2 sets - 10 reps - 3 sec hold - Mid-Lower Cervical Extension SNAG with Strap  - 1 x daily - 7 x weekly - 2 sets - 10 reps - 3 sec hold - Supine Chin Tuck  - 1 x daily - 7 x weekly - 3 sets - 10 reps - Supine Shoulder Flexion Extension AAROM with Dowel  - 2 x daily - 7 x weekly - 2 sets - 10 reps - Seated Cervical Rotation AROM  - 2 x daily - 7 x weekly - 1 sets - 10 reps - 5 sec hold - Standing Shoulder Extension with Dowel  - 2 x daily - 7 x weekly - 2 sets - 10 reps - Seated Thoracic Lumbar Extension  - 1 x daily - 7 x weekly - 3 sets - 10 reps - 5 hold - Seated Shoulder Flexion  - 1 x daily - 7 x weekly - 3 sets - 10 reps - Seated Shoulder Flexion AAROM with Dowel  - 1 x daily - 7 x weekly - 2 sets - 10 reps - Seated Shoulder Abduction AAROM with Dowel  - 1 x daily - 7 x weekly - 2 sets - 10 reps  ASSESSMENT:  CLINICAL IMPRESSION: [name] continues to have reports of R shoulder pain along the anterior portion. Session focused on MT to decrease tension in anterior musculature of the shoulder and TE for periscapular strength to improve posture. Throughout session cues were required for full periscap engagement. With putting on pants, hanging up clothes, and reaching to 2nd shelf, she is still having issues and mostly has to assist with L UE to complete task. She seems to feel like she is not progressing and may want to halt PT next visit.    OBJECTIVE IMPAIRMENTS:  decreased ROM, decreased strength, increased muscle spasms, impaired flexibility, impaired UE functional use, postural dysfunction, obesity, and pain.   ACTIVITY LIMITATIONS: dressing and reach over head  PARTICIPATION LIMITATIONS:  OH ADLS  PERSONAL FACTORS: Age, Fitness, Past/current experiences, Time since onset of injury/illness/exacerbation, Transportation, and 3+ comorbidities: Chronic LBP with h/o surgery for "ruptured disc", lumbar spinal stenosis, OA, B TKA, L RTC repair, chronic diastolic HF, HLD, moderate asthma, DOE, HTN, breast cancer, benign essential tremor, morbid obesity     are also affecting patient's functional outcome.   REHAB POTENTIAL: Good  CLINICAL DECISION MAKING: Stable/uncomplicated  EVALUATION COMPLEXITY: Low   GOALS: Goals reviewed with patient? Yes  SHORT TERM GOALS: Target date: 12/15/2021  Patient will be independent with initial HEP.  Baseline:  Goal status: MET 12/14/21 Pt reports she can complete HEP with written instruction.    LONG TERM GOALS: Target date: 01/12/2022 (Remove Blue Hyperlink)  Patient will be independent with advanced/ongoing HEP to improve outcomes and carryover.  Baseline:  Goal status: IN PROGRESS  2.  Patient will report 50-75% improvement in neck pain to improve QOL.  Baseline:  Goal status: IN PROGRESS  3.  Patient will demonstrate improved R cervical ROM by at least 5 deg with minimal pain for improved ease of ADLS.  Baseline:  Goal status: IN PROGRESS  4.  Patient will report <= 11% on NDI  to demonstrate improved functional ability.  Baseline: 26% Goal status: IN PROGRESS  5.  Patient will be able to pull up her pants without difficulty. Baseline:  Goal status: IN PROGRESS -   6. Patient will be able to hang clothes using her R UE independently   Baseline:  Goal status: IN PROGRESS   7. Patient will be able to reach into her second shelf cabinets without difficulty Baseline:   Goal status: IN PROGRESS    PLAN:  PT FREQUENCY: 2x/week  PT DURATION: 6 weeks  PLANNED INTERVENTIONS: Therapeutic exercises, Therapeutic activity, Neuromuscular re-education, Patient/Family education, Self Care, Joint mobilization, Dry Needling, Electrical stimulation, Spinal mobilization, Cryotherapy, Moist heat, Taping, Traction, Ultrasound, Ionotophoresis 4mg/ml Dexamethasone, Manual therapy, and Re-evaluation  PLAN FOR NEXT SESSION: progress HEP for neck, upper back and functional shoulder strengthening, progress shoulder ROM & strengthening, ionto patch prm, manual/DN to suboccipitals and cspine, spinal mobs prn.  Braylin L Clark, PTA 01/05/2022, 11:28 AM  

## 2022-01-11 NOTE — Therapy (Signed)
OUTPATIENT PHYSICAL THERAPY TREATMENT   Patient Name: Amanda Copeland MRN: [id number] DOB:03/12/1940, 81 y.o., female Today's Date: 01/05/2022   PT End of Session - 01/05/22 1055     Visit Number 7    Date for PT Re-Evaluation 01/12/22    Authorization Type Medicare & BCBS    Progress Note Due on Visit 10    PT Start Time 0933    PT Stop Time 1013    PT Time Calculation (min) 40 min    Activity Tolerance Patient tolerated treatment well    Behavior During Therapy WFL for tasks assessed/performed                Past Medical History:  Diagnosis Date   Arthritis    Asthma    childhood now returning   Back pain affecting pregnancy 11/02/2014   Benign essential tremor 10/28/2014   Bilateral dry eyes    Breast cancer (HCC)    right   Cancer (HCC)    Breast   Dry eyes    Encounter for Medicare annual wellness exam 05/22/2016   Essential hypertension, benign 10/28/2014   H/O measles    H/O mumps    History of chicken pox    Lumbago 11/02/2014   Mixed hyperlipidemia 10/28/2014   Obesity 10/28/2014   Stenosis of cervical spine    Past Surgical History:  Procedure Laterality Date   BACK SURGERY     BREAST REDUCTION SURGERY Left    CHOLECYSTECTOMY     EYE SURGERY  2008   b/l cataracts removed, in [place]   MASTECTOMY Right    REDUCTION MAMMAPLASTY     REPLACEMENT TOTAL KNEE BILATERAL Bilateral    ROTATOR CUFF REPAIR Left    Patient Active Problem List   Diagnosis Date Noted   Osteoporosis 05/25/2021   Scapular dysfunction 03/17/2021   Compression fracture of T11 vertebra (HCC) 01/20/2021   SI joint arthritis 01/20/2021   Vitamin D deficiency 01/20/2021   Age-related osteoporosis with current pathological fracture 11/26/2020   Pain of right upper extremity 11/11/2020   Bradycardia 11/11/2020   Neck pain 03/11/2020   Tremor 03/01/2020   Hypothyroid 03/01/2020   Physical deconditioning 11/25/2019   At risk for obstructive sleep apnea 11/25/2019   Primary  osteoarthritis of right shoulder 02/28/2019   Other forms of dyspnea 11/22/2018   Educated about COVID-19 virus infection 07/03/2018   Greater trochanteric pain syndrome of right lower extremity 03/18/2018   Chronic nonallergic rhinitis 12/20/2017   Cough 12/20/2017   Right flank pain 12/10/2017   Fatigue 07/12/2017   Morbid obesity (HCC) 03/05/2017   Chronic diastolic heart failure (HCC) 11/29/2016   Dyspnea on exertion 10/06/2016   Asthma 10/06/2016   Encounter for Medicare annual wellness exam 05/22/2016   Hyperglycemia 08/07/2015   Allergic 08/07/2015   Gout 08/07/2015   Leukocytosis 08/07/2015   Lower abdominal pain 04/21/2015   Low back pain 11/02/2014   Esophageal reflux 10/28/2014   Essential hypertension, benign 10/28/2014   Hyperlipidemia 10/28/2014   Benign essential tremor 10/28/2014   Bilateral dry eyes    Moderate persistent asthma    Arthritis    Cancer (HCC)    History of chicken pox     PCP: Blyth, Stacey A, MD  REFERRING PROVIDER: Cram, Gary, MD  REFERRING DIAG: M54.2 (ICD-10-CM) - Cervicalgia  THERAPY DIAG:  Cervicalgia  Stiffness of right shoulder, not elsewhere classified  Muscle weakness (generalized)  Cramp and spasm  Rationale for Evaluation and Treatment Rehabilitation    ONSET DATE: 2 weeks ago  SUBJECTIVE:                                                                                                                                                                                                         SUBJECTIVE STATEMENT:  ***  PERTINENT HISTORY:  Cervical radiculopathy R, Chronic LBP with h/o surgery for "ruptured disc", lumbar spinal stenosis, OA, B TKA, L RTC repair, chronic diastolic HF, HLD, moderate asthma, DOE, HTN, breast cancer, benign essential tremor, morbid obesity   PAIN:  Are you having pain? Yes: NPRS scale: 5/10 Pain location: right shoulder, arm Pain description: pull Aggravating factors: turning head to  right Relieving factors: Advil  PRECAUTIONS: None  WEIGHT BEARING RESTRICTIONS: No  FALLS:  Has patient fallen in last 6 months? No  LIVING ENVIRONMENT: Lives with: lives with their spouse Lives in: House/apartment Stairs: Yes: External: 1 steps; over threshold Has following equipment at home: Single point cane, Walker - 2 wheeled, Environmental consultant - 4 wheeled, Wheelchair (manual), shower chair, and bed side commode  OCCUPATION: Retired  PLOF: Independent  PATIENT GOALS: to strengthen my neck, to improve arm function  OBJECTIVE:   DIAGNOSTIC FINDINGS:   Cervical MRI - 07/07/21: 1. Degenerative changes of the cervical spine with moderate spinal canal stenosis at C5-6 and C6-7 with mild mass effect on the cord at these levels without cord signal abnormality.  2. Multilevel high-grade neural foraminal narrowing, as described above, severe bilaterally at C5-6.  PATIENT SURVEYS:  NDI 13/50 = 26% disability  COGNITION: Overall cognitive status: Within functional limits for tasks assessed  SENSATION: WFL  POSTURE: rounded shoulders, forward head, and increased thoracic kyphosis  PALPATION: Bil suboccipitals R>L, R cervical and UT tenderness   CERVICAL ROM:   Active ROM A/PROM (deg) eval  Flexion full  Extension full  Right lateral flexion WFL  Left lateral flexion WFL  Right rotation 40*  Left rotation 50   (Blank rows = not tested)  UPPER EXTREMITY ROM:   ROM A/P Right eval Left eval  Shoulder flexion 133/ 140   Shoulder extension WNL   Shoulder abduction 96/104   Shoulder internal rotation 60/72   Shoulder external rotation 36/47    (Blank rows = not tested)  UPPER EXTREMITY MMT:   MMT Right eval Left eval  Shoulder flexion 4 with audible popping 4+  Shoulder extension 4- 4-  Shoulder abduction 4+ 4+  Shoulder internal rotation 5 4+  Shoulder external rotation 4+ with audible popping 4-  Middle trapezius    Lower trapezius     (  Blank rows = not  tested)   CERVICAL MMT: 4+/5 to 5/5 except extension 4/5  CERVICAL SPECIAL TESTS:  Spurling's test: Negative and Distraction test: Negative Negative Michel Bickers and mild impingement sign R shoulder    TODAY'S TREATMENT:                                                                                                                      DATE:   01/12/22 THERAPEUTIC EXERCISE: to improve flexibility, strength and mobility.  Verbal and tactile cues throughout for technique. UBE L1 6 min ***  12/27/21 THERAPEUTIC EXERCISE: to improve flexibility, strength and mobility.  Verbal and tactile cues throughout for technique. UBE L1 6 min Seated shoulder flexion with wand x 10  Seated chest press with wand x 10  Seated row 10# 2x10 Mid row with red TB x 10 Horiz ABD red TB x 10 Wall push ups x 10  Manual Therapy: to decrease muscle spasm and pain and improve mobility  STM to anterior deltoid, biceps  12/27/21 THERAPEUTIC EXERCISE: to improve flexibility, strength and mobility.  Verbal and tactile cues throughout for technique. UBE L1 3 min fwd, 3 min back Seated R scalene bracing onto the table, 2x30 sec hold- cues for upright posture  Seated R UT Stretch bracing onto the table 2x30 sec hold- cues for upright posture Seated Cervical AROM Alt Flexion + Extension 1x10 Seated Cervical AROM SB Alt R/L 1x10 Seated Cervical AROM Rotation Alt R/L 1x10 Seated Thoracic Extension sitting on airex with pool noodle between shoulder blades for tactile cues x15-  cues for shoulder depression during movement Seated Thoracic Extension + AAROM Shoulder Flexion 0-90 degrees with cane x20- pool noodle for tactile cues for scapular retraction- limited to 90 degrees due to pain Seated Thoracic Extension + AAROM R Shoulder ER with cane to tolerance x20  MANUAL THERAPY: To promote normalized muscle tension, increased ROM, and reduced pain. STM and TrP release of taut bands in R pec muscles and  UT  12/17/21 THERAPEUTIC EXERCISE: to improve flexibility, strength and mobility.  Verbal and tactile cues throughout for technique.  UBE L1.0 5 min fwd/ 1 min back ( increased pain going back) Supine shoulder flexion to ~80  deg to avoid crepitus Supine shoulder flexion 80 deg CW/CCW circles x 10 each Supine shoulder ER with wand R shoulder x 10 Supine chin tucks 10x  Standing row red TB x 20  Standing shoulder extension 2x10 red TB Standing scap retraction + ER at doorframe 2x10   PATIENT EDUCATION:  Education details: HEP review, postural awareness, and posture and body mechanics for typical daily postioning, mobility and household tasks  Person educated: Patient Education method: Consulting civil engineer, Media planner, Verbal cues, and Handouts Education comprehension: verbalized understanding and returned demonstration  HOME EXERCISE PROGRAM: Access Code: [email or web address] URL: [email or web address] Date: 12/14/2021 Prepared by: Zeb Comfort  Exercises - Doorway Pec Stretch at 60 Degrees Abduction with Arm Straight  - 2-3 x daily - 7 x weekly - 3 reps -  30 sec hold - Standing Bilateral Low Shoulder Row with Anchored Resistance  - 1 x daily - 3 x weekly - 2 sets - 10 reps - 5 sec hold - Scapular Retraction with Resistance Advanced  - 1 x daily - 3 x weekly - 2 sets - 10 reps - 5 sec hold - Shoulder External Rotation and Scapular Retraction with Resistance  - 1 x daily - 3 x weekly - 3 sets - 10 reps - Standing Shoulder Flexion to 90 Degrees with Dumbbells  - 1 x daily - 3 x weekly - 3 sets - 10 reps - Seated Gentle Upper Trapezius Stretch  - 2-3 x daily - 7 x weekly - 3 reps - 30 sec hold - Gentle Levator Scapulae Stretch  - 2-3 x daily - 7 x weekly - 3 reps - 30 sec hold - Seated Assisted Cervical Rotation with Towel  - 1 x daily - 7 x weekly - 2 sets - 10 reps - 3 sec hold - Mid-Lower Cervical Extension SNAG with Strap  - 1 x daily - 7 x weekly - 2 sets - 10 reps - 3 sec hold -  Supine Chin Tuck  - 1 x daily - 7 x weekly - 3 sets - 10 reps - Supine Shoulder Flexion Extension AAROM with Dowel  - 2 x daily - 7 x weekly - 2 sets - 10 reps - Seated Cervical Rotation AROM  - 2 x daily - 7 x weekly - 1 sets - 10 reps - 5 sec hold - Standing Shoulder Extension with Dowel  - 2 x daily - 7 x weekly - 2 sets - 10 reps - Seated Thoracic Lumbar Extension  - 1 x daily - 7 x weekly - 3 sets - 10 reps - 5 hold - Seated Shoulder Flexion  - 1 x daily - 7 x weekly - 3 sets - 10 reps - Seated Shoulder Flexion AAROM with Dowel  - 1 x daily - 7 x weekly - 2 sets - 10 reps - Seated Shoulder Abduction AAROM with Dowel  - 1 x daily - 7 x weekly - 2 sets - 10 reps  ASSESSMENT:  CLINICAL IMPRESSION: ***  [name] continues to have reports of R shoulder pain along the anterior portion. Session focused on MT to decrease tension in anterior musculature of the shoulder and TE for periscapular strength to improve posture. Throughout session cues were required for full periscap engagement. With putting on pants, hanging up clothes, and reaching to 2nd shelf, she is still having issues and mostly has to assist with L UE to complete task. She seems to feel like she is not progressing and may want to halt PT next visit.   OBJECTIVE IMPAIRMENTS: decreased ROM, decreased strength, increased muscle spasms, impaired flexibility, impaired UE functional use, postural dysfunction, obesity, and pain.   ACTIVITY LIMITATIONS: dressing and reach over head  PARTICIPATION LIMITATIONS:  OH ADLS  PERSONAL FACTORS: Age, Fitness, Past/current experiences, Time since onset of injury/illness/exacerbation, Transportation, and 3+ comorbidities: Chronic LBP with h/o surgery for "ruptured disc", lumbar spinal stenosis, OA, B TKA, L RTC repair, chronic diastolic HF, HLD, moderate asthma, DOE, HTN, breast cancer, benign essential tremor, morbid obesity     are also affecting patient's functional outcome.   REHAB POTENTIAL:  Good  CLINICAL DECISION MAKING: Stable/uncomplicated  EVALUATION COMPLEXITY: Low   GOALS: Goals reviewed with patient? Yes  SHORT TERM GOALS: Target date: 12/15/2021  Patient will be independent with initial  HEP.  Baseline:  Goal status: MET 12/14/21 Pt reports she can complete HEP with written instruction.    LONG TERM GOALS: Target date: 01/12/2022 (Remove Blue Hyperlink)  Patient will be independent with advanced/ongoing HEP to improve outcomes and carryover.  Baseline:  Goal status: IN PROGRESS  2.  Patient will report 50-75% improvement in neck pain to improve QOL.  Baseline:  Goal status: IN PROGRESS  3.  Patient will demonstrate improved R cervical ROM by at least 5 deg with minimal pain for improved ease of ADLS.  Baseline:  Goal status: IN PROGRESS  4.  Patient will report <= 11% on NDI  to demonstrate improved functional ability.  Baseline: 26% Goal status: IN PROGRESS  5.  Patient will be able to pull up her pants without difficulty. Baseline:  Goal status: IN PROGRESS -   6. Patient will be able to hang clothes using her R UE independently   Baseline:  Goal status: IN PROGRESS   7. Patient will be able to reach into her second shelf cabinets without difficulty Baseline:  Goal status: IN PROGRESS    PLAN:  PT FREQUENCY: 2x/week  PT DURATION: 6 weeks  PLANNED INTERVENTIONS: Therapeutic exercises, Therapeutic activity, Neuromuscular re-education, Patient/Family education, Self Care, Joint mobilization, Dry Needling, Electrical stimulation, Spinal mobilization, Cryotherapy, Moist heat, Taping, Traction, Ultrasound, Ionotophoresis 65m/ml Dexamethasone, Manual therapy, and Re-evaluation  PLAN FOR NEXT SESSION: progress HEP for neck, upper back and functional shoulder strengthening, progress shoulder ROM & strengthening, ionto patch prm, manual/DN to suboccipitals and cspine, spinal mobs prn.  BArtist Pais PTA 01/05/2022, 11:28 AM

## 2022-01-12 ENCOUNTER — Encounter: Payer: Self-pay | Admitting: Physical Therapy

## 2022-01-12 ENCOUNTER — Ambulatory Visit: Payer: Medicare Other | Attending: Neurosurgery | Admitting: Physical Therapy

## 2022-01-12 DIAGNOSIS — M25611 Stiffness of right shoulder, not elsewhere classified: Secondary | ICD-10-CM | POA: Insufficient documentation

## 2022-01-12 DIAGNOSIS — M5412 Radiculopathy, cervical region: Secondary | ICD-10-CM | POA: Diagnosis not present

## 2022-01-12 DIAGNOSIS — M542 Cervicalgia: Secondary | ICD-10-CM | POA: Insufficient documentation

## 2022-01-12 DIAGNOSIS — R293 Abnormal posture: Secondary | ICD-10-CM | POA: Insufficient documentation

## 2022-01-12 DIAGNOSIS — R252 Cramp and spasm: Secondary | ICD-10-CM | POA: Insufficient documentation

## 2022-01-12 DIAGNOSIS — M6281 Muscle weakness (generalized): Secondary | ICD-10-CM | POA: Diagnosis not present

## 2022-01-12 DIAGNOSIS — M62838 Other muscle spasm: Secondary | ICD-10-CM | POA: Diagnosis not present

## 2022-01-13 NOTE — Telephone Encounter (Signed)
Pt ready for scheduling on or after 01/27/22   Out-of-pocket cost due at time of visit: $60   Primary: Medicare Prolia co-insurance: 20% (approximately $276) Admin fee co-insurance: 20% (approximately $25)   Secondary: [place]: 20% (approx $55) Admin fee co-insurance: 20% (approx $5)   Deductible: deductible has been met   Prior Auth: APPROVED [id number]# [id number], case [id number] Valid 04/02/21-04/01/22   ** This summary of benefits is an estimation of the patient's out-of-pocket cost. Exact cost may vary based on individual plan coverage.

## 2022-01-18 ENCOUNTER — Encounter: Payer: Self-pay | Admitting: Family Medicine

## 2022-01-18 DIAGNOSIS — H9193 Unspecified hearing loss, bilateral: Secondary | ICD-10-CM

## 2022-01-19 ENCOUNTER — Telehealth: Payer: Self-pay | Admitting: Family Medicine

## 2022-01-19 NOTE — Telephone Encounter (Signed)
Patient returned Shamaine's call. Asked patient if she wants to be referred to a location in [facility] or [place]. She said [place] would be closest.

## 2022-01-19 NOTE — Telephone Encounter (Signed)
Mychart message was sent to pt so she can answer the other questions PCP asked

## 2022-01-19 NOTE — Telephone Encounter (Signed)
Called pt lvm to call our office back  To let us know who she want referral to [facility] or [facility]

## 2022-01-20 NOTE — Telephone Encounter (Signed)
Called pt and pt stated [facility] is fine for referral and Hearing loss for few months, both sides equally. Referral was sent

## 2022-01-20 NOTE — Addendum Note (Signed)
Addended by: Laure Kidney on: 01/20/2022 11:42 AM   Modules accepted: Orders

## 2022-01-21 NOTE — Telephone Encounter (Signed)
Pt informed of below.  Nurse visit scheduled 01/27/22.

## 2022-01-27 ENCOUNTER — Ambulatory Visit (INDEPENDENT_AMBULATORY_CARE_PROVIDER_SITE_OTHER): Payer: Medicare Other

## 2022-01-27 VITALS — Ht 61.0 in | Wt 236.0 lb

## 2022-01-27 DIAGNOSIS — M8000XD Age-related osteoporosis with current pathological fracture, unspecified site, subsequent encounter for fracture with routine healing: Secondary | ICD-10-CM

## 2022-01-27 MED ORDER — DENOSUMAB 60 MG/ML ~~LOC~~ SOSY
60.0000 mg | PREFILLED_SYRINGE | Freq: Once | SUBCUTANEOUS | Status: AC
  Administered 2022-01-27: 60 mg via SUBCUTANEOUS

## 2022-01-27 NOTE — Progress Notes (Signed)
Patient is here for nurse visit for Prolia 60 mg. She received [place] injection in her left arm. She will return in 6 months for her next Prolia injection.

## 2022-01-28 ENCOUNTER — Other Ambulatory Visit: Payer: Self-pay

## 2022-01-28 DIAGNOSIS — H9193 Unspecified hearing loss, bilateral: Secondary | ICD-10-CM

## 2022-02-09 DIAGNOSIS — L570 Actinic keratosis: Secondary | ICD-10-CM | POA: Diagnosis not present

## 2022-02-09 DIAGNOSIS — Z129 Encounter for screening for malignant neoplasm, site unspecified: Secondary | ICD-10-CM | POA: Diagnosis not present

## 2022-02-09 DIAGNOSIS — L821 Other seborrheic keratosis: Secondary | ICD-10-CM | POA: Diagnosis not present

## 2022-02-09 DIAGNOSIS — Z86008 Personal history of in-situ neoplasm of other site: Secondary | ICD-10-CM | POA: Diagnosis not present

## 2022-02-13 NOTE — Telephone Encounter (Signed)
Last Prolia inj 01/27/22 Next Prolia inj due 07/30/22

## 2022-02-15 DIAGNOSIS — M542 Cervicalgia: Secondary | ICD-10-CM | POA: Diagnosis not present

## 2022-02-17 DIAGNOSIS — R053 Chronic cough: Secondary | ICD-10-CM | POA: Diagnosis not present

## 2022-02-17 DIAGNOSIS — H903 Sensorineural hearing loss, bilateral: Secondary | ICD-10-CM | POA: Diagnosis not present

## 2022-02-17 DIAGNOSIS — J320 Chronic maxillary sinusitis: Secondary | ICD-10-CM | POA: Diagnosis not present

## 2022-02-24 DIAGNOSIS — H353132 Nonexudative age-related macular degeneration, bilateral, intermediate dry stage: Secondary | ICD-10-CM | POA: Diagnosis not present

## 2022-02-24 DIAGNOSIS — H43813 Vitreous degeneration, bilateral: Secondary | ICD-10-CM | POA: Diagnosis not present

## 2022-02-24 DIAGNOSIS — H02422 Myogenic ptosis of left eyelid: Secondary | ICD-10-CM | POA: Diagnosis not present

## 2022-02-24 DIAGNOSIS — Z961 Presence of intraocular lens: Secondary | ICD-10-CM | POA: Diagnosis not present

## 2022-02-24 DIAGNOSIS — H04123 Dry eye syndrome of bilateral lacrimal glands: Secondary | ICD-10-CM | POA: Diagnosis not present

## 2022-03-30 ENCOUNTER — Other Ambulatory Visit: Payer: Self-pay | Admitting: Neurology

## 2022-03-30 DIAGNOSIS — G250 Essential tremor: Secondary | ICD-10-CM

## 2022-04-05 ENCOUNTER — Other Ambulatory Visit: Payer: Self-pay | Admitting: Family Medicine

## 2022-04-05 ENCOUNTER — Other Ambulatory Visit: Payer: Self-pay | Admitting: Interventional Cardiology

## 2022-04-05 ENCOUNTER — Other Ambulatory Visit: Payer: Self-pay | Admitting: Neurology

## 2022-04-05 DIAGNOSIS — G250 Essential tremor: Secondary | ICD-10-CM

## 2022-04-13 NOTE — Progress Notes (Unsigned)
Cardiology Office Note   Date:  04/14/2022   ID:  Amanda Copeland, DOB 09-11-1940, MRN [id number]  PCP:  [name], MD    No chief complaint on file.    Wt Readings from Last 3 Encounters:  04/14/22 237 lb 9.6 oz (107.8 kg)  01/27/22 236 lb (107 kg)  12/13/21 236 lb 3.2 oz (107.1 kg)       History of Present Illness: Amanda Copeland is a 82 y.o. female  Who has RF for CAD.  SHe has had a stress test several years ago that was normal.   She is intolerant to statins to muscle pain and fatigue.  SHe took atorvastatin.  She also tried another, maybe pravastatin but did not tolerate this.    It was recommended that she follow the DASH diet for weight loss.    In 2018, She saw pulmonary.  She was told her lungs are ok.   Lived in [place], then moved to the [place] area.    She never tried the DASH diet.     Chronic SHOB-visit with PMD in 2021:"  PFT was unremarkable, and no improvement with inhaler therapy." She also decline home sleep test.    Coronary CT in 2021 shosed: "Coronary calcium score of 26. This was 67 percentile for age and sex matched control.   2. Normal coronary origin with right dominance.   3. CAD-RADS 1. Minimal non-obstructive CAD (0-24%). Consider non-atherosclerotic causes of chest pain. Consider preventive therapy and risk factor modification."   2021 CPX test: "Conclusion: The interpretation of this test is limited due to submaximal effort during the exercise. Based on available data, exercise testing with gas exchange demonstrates mildly reduced functional capacity when compared to matched sedentary norms.Patient appears with ventilatory limitation related to her body habitus. There is no clear cardiopulmonary limitation. VE/VCO2 slope is elevated and suggestive of increased pulmonary pressures during exercise. There was also chronotropic incompetence most likely related to submaximal effort. "   In July 2022, [place] and broke her leg-did not pass  out.  She was trying to get into a pool and her legs buckled.  She had surgery, plate with nine screws.  She went to rehab for 50 days.   As of 2023: "Uses a walker now.  Did PT- still has some problems walking.  No more falls. Balance has decreased.  Does have access to stationary bike.   Does water aerobics in outdoor pool in [place]. "   Feels tired.  Got COVID on a cruise ship- in 2022. Did a cruise in [place] in 2023.   Denies : Chest pain. Dizziness. Leg edema. Nitroglycerin use. Orthopnea. Palpitations. Paroxysmal nocturnal dyspnea.  Syncope.    Not exercising. Can't get in ad out of the pool easily.  Needs a lift.    Propranolol increased for tremor.  Heart rate at home typically in the high 40s to low 50s but no passing out spells.     Past Medical History:  Diagnosis Date   Arthritis    Asthma    childhood now returning   Back pain affecting pregnancy 11/02/2014   Benign essential tremor 10/28/2014   Bilateral dry eyes    Breast cancer ([place])    right   Cancer ([place])    Breast   Dry eyes    Encounter for Medicare annual wellness exam 05/22/2016   Essential hypertension, benign 10/28/2014   H/O measles    H/O mumps    History of chicken  pox    Lumbago 11/02/2014   Mixed hyperlipidemia 10/28/2014   Obesity 10/28/2014   Stenosis of cervical spine     Past Surgical History:  Procedure Laterality Date   BACK SURGERY     BREAST REDUCTION SURGERY Left    CHOLECYSTECTOMY     EYE SURGERY  2008   b/l cataracts removed, in [place] Right    REDUCTION MAMMAPLASTY     REPLACEMENT TOTAL KNEE BILATERAL Bilateral    ROTATOR CUFF REPAIR Left      Current Outpatient Medications  Medication Sig Dispense Refill   albuterol (PROVENTIL HFA;VENTOLIN HFA) 108 (90 Base) MCG/ACT inhaler INHALE 2 PUFFS INTO THE LUNGS EVERY 4 (FOUR) HOURS AS NEEDED FOR WHEEZING OR SHORTNESS OF BREATH. 6.7 Inhaler 1   allopurinol (ZYLOPRIM) 100 MG tablet TAKE 2 TABLETS BY MOUTH EVERY DAY 180  tablet 1   AMBULATORY NON FORMULARY MEDICATION Rollator  Dx: Gait instability 1 Device 0   amLODipine (NORVASC) 5 MG tablet TAKE 1 TABLET (5 MG TOTAL) BY MOUTH DAILY. 90 tablet 3   Ascorbic Acid (VITAMIN C PO) Take 1 tablet by mouth daily.      aspirin 81 MG tablet Take 81 mg by mouth daily.     colchicine 0.6 MG tablet Take 2 tabs at first and then 1 tab q 2 hours til pain relief or max of 6 tabs in 24 hours (Patient taking differently: Take 2 tabs at first and then 1 tab q 2 hours til pain relief or max of 6 tabs in 24 hours - PRN for gout flare-up) 6 tablet 1   doxycycline (VIBRA-TABS) 100 MG tablet Take 1 tablet (100 mg total) by mouth 2 (two) times daily. 20 tablet 0   DYMISTA 137-50 MCG/ACT SUSP 1-2 SPRAYS PER NOSTRIL TWICE DAILY FOR RUNNY NOSE 69 g 1   ezetimibe (ZETIA) 10 MG tablet TAKE 1 TABLET BY MOUTH EVERY DAY 90 tablet 1   famotidine (PEPCID) 40 MG tablet TAKE 1 TABLET BY MOUTH EVERYDAY AT BEDTIME 90 tablet 1   fluticasone (FLOVENT HFA) 110 MCG/ACT inhaler 2 PUFFS TWICE DAILY TO PREVENT COUGHING OR WHEEZING. MAX INS WILL COVER IS A 30 DAY 36 each 1   furosemide (LASIX) 40 MG tablet TAKE 1 TABLET BY MOUTH EVERY DAY 90 tablet 0   KLOR-CON M20 20 MEQ tablet TAKE 1 TABLET BY MOUTH DAILY. KEEP UPCOMING APPOINTMENT 90 tablet 3   levothyroxine (SYNTHROID) 112 MCG tablet TAKE 1 TABLET BY MOUTH EVERY DAY BEFORE BREAKFAST 90 tablet 1   loratadine (CLARITIN) 10 MG tablet TAKE 1 TABLET BY MOUTH EVERY DAY 90 tablet 1   Multiple Vitamins-Minerals (ICAPS AREDS 2) CAPS Take 1 capsule by mouth 2 (two) times daily.     PREVACID 30 MG capsule TAKE 1 CAPSULE BY MOUTH TWICE A DAY --MAX LIMIT 180 capsule 1   promethazine-dextromethorphan (PROMETHAZINE-DM) 6.25-15 MG/5ML syrup Take 2.5-5 mLs by mouth 3 (three) times daily as needed for cough. 160 mL 0   propranolol (INDERAL) 60 MG tablet Take 1 tablet (60 mg total) by mouth 2 (two) times daily. 180 tablet 3   Red Yeast Rice Extract (RED YEAST RICE PO)  Take by mouth daily.     Vitamin D, Ergocalciferol, (DRISDOL) 1.25 MG (50000 UNIT) CAPS capsule TAKE 1 CAPSULE (50,000 UNITS TOTAL) BY MOUTH EVERY 7 (SEVEN) DAYS 4 capsule 4   No current facility-administered medications for this visit.    Allergies:   Amoxicillin, Ampicillin, Penicillins, and Statins  Social History:  The patient  reports that she has never smoked. She has never used smokeless tobacco. She reports current alcohol use. She reports that she does not use drugs.   Family History:  The patient's family history includes Appendicitis in her paternal grandmother; Asthma in her father; Cancer in her father and maternal grandmother; Heart attack in her mother; Heart disease in her maternal grandfather and mother; Heart failure in her mother; Hypertension in her mother; Obesity in her daughter; Parkinson's disease in her father.    ROS:  Please see the history of present illness.   Otherwise, review of systems are positive for decreasing activity.   All other systems are reviewed and negative.    PHYSICAL EXAM: VS:  BP (!) 117/53   Pulse (!) 51   Ht '5\' 1"'$  (1.549 m)   Wt 237 lb 9.6 oz (107.8 kg)   SpO2 95%   BMI 44.89 kg/m  , BMI Body mass index is 44.89 kg/m. GEN: Well nourished, well developed, in no acute distress HEENT: normal Neck: no JVD, carotid bruits, or masses Cardiac: Bradycardic; no murmurs, rubs, or gallops,no edema  Respiratory:  clear to auscultation bilaterally, normal work of breathing GI: soft, nontender, nondistended, + BS MS: no deformity or atrophy Skin: warm and dry, no rash Neuro:  Strength and sensation are intact Psych: euthymic mood, full affect   EKG:   The ekg ordered today demonstrates sinus bradycardia, prolonged PR interval, no ST segment changes   Recent Labs: 11/25/2021: ALT 9; BUN 14; Creatinine, Ser 0.83; Hemoglobin 12.8; Platelets 271.0; Potassium 4.0; Sodium 143; TSH 2.07   Lipid Panel    Component Value Date/Time   CHOL 177  11/25/2021 1345   TRIG 207.0 (H) 11/25/2021 1345   HDL 44.30 11/25/2021 1345   CHOLHDL 4 11/25/2021 1345   VLDL 41.4 (H) 11/25/2021 1345   LDLCALC 118 (H) 05/25/2021 1230   LDLDIRECT 106.0 11/25/2021 1345     Other studies Reviewed: Additional studies/ records that were reviewed today with results demonstrating: LDL 118, creatinine 0.83.  Labs reviewed   ASSESSMENT AND PLAN:  Chronic diastolic heart failure: Appears euvolemic.  Continue furosemide.  Low-salt diet.  Avoid processed foods.  The pool will be her best option for exercise due to joint problems.  Difficulty getting in and out of the pool has limited this activity.  Hopefully, they can find a way to get more activity in the pool or she could try a recumbent bike.  Both were discussed today.  Exercise target noted below. Hypertensive heart disease: Blood pressure well-controlled.  Of note, propranolol was increased due to tremor.  Her heart rate is slow, typically in the high 40s to low 50s.  No syncope.  No further increase of her beta-blocker is possible at this time.  If she has worsening tremor and needs more beta-blockade, would have to stop diltiazem Hyperlipidemia: LDL 118.  No longer taking red yeast rice. DOE: Chronic.  COunselled to try to increase exercise.   Snoring.  SLeep study recommended by me and Dr. Halford Chessman in the past.  Coronary calcification: Low calcium score.  Statin intolerant.  Not pursuing more advanced lipid lowering therapies at this time.    Current medicines are reviewed at length with the patient today.  The patient concerns regarding her medicines were addressed.  The following changes have been made:  No change  Labs/ tests ordered today include:  No orders of the defined types were placed in this encounter.  Recommend 150 minutes/week of aerobic exercise Low fat, low carb, high fiber diet recommended  Disposition:   FU in 1 year   Signed, Larae Grooms, MD  04/14/2022 10:03 AM    [facility] [place], [place], [place]  [address] Phone: [phone]; Fax: [phone]

## 2022-04-14 ENCOUNTER — Encounter: Payer: Self-pay | Admitting: Interventional Cardiology

## 2022-04-14 ENCOUNTER — Ambulatory Visit: Payer: Medicare Other | Attending: Interventional Cardiology | Admitting: Interventional Cardiology

## 2022-04-14 VITALS — BP 117/53 | HR 51 | Ht 61.0 in | Wt 237.6 lb

## 2022-04-14 DIAGNOSIS — I110 Hypertensive heart disease with heart failure: Secondary | ICD-10-CM | POA: Diagnosis not present

## 2022-04-14 DIAGNOSIS — E782 Mixed hyperlipidemia: Secondary | ICD-10-CM

## 2022-04-14 DIAGNOSIS — R5382 Chronic fatigue, unspecified: Secondary | ICD-10-CM

## 2022-04-14 DIAGNOSIS — I5032 Chronic diastolic (congestive) heart failure: Secondary | ICD-10-CM

## 2022-04-14 DIAGNOSIS — R0683 Snoring: Secondary | ICD-10-CM | POA: Diagnosis not present

## 2022-04-14 NOTE — Patient Instructions (Signed)

## 2022-04-21 DIAGNOSIS — H35453 Secondary pigmentary degeneration, bilateral: Secondary | ICD-10-CM | POA: Diagnosis not present

## 2022-04-21 DIAGNOSIS — H353132 Nonexudative age-related macular degeneration, bilateral, intermediate dry stage: Secondary | ICD-10-CM | POA: Diagnosis not present

## 2022-04-21 DIAGNOSIS — H04123 Dry eye syndrome of bilateral lacrimal glands: Secondary | ICD-10-CM | POA: Diagnosis not present

## 2022-04-21 DIAGNOSIS — Z961 Presence of intraocular lens: Secondary | ICD-10-CM | POA: Diagnosis not present

## 2022-04-21 DIAGNOSIS — H35363 Drusen (degenerative) of macula, bilateral: Secondary | ICD-10-CM | POA: Diagnosis not present

## 2022-05-23 ENCOUNTER — Encounter: Payer: Self-pay | Admitting: *Deleted

## 2022-05-29 NOTE — Assessment & Plan Note (Signed)
Encouraged to get adequate exercise, calcium and vitamin d intake 

## 2022-05-29 NOTE — Assessment & Plan Note (Signed)
Hydrate and monitor 

## 2022-05-29 NOTE — Assessment & Plan Note (Signed)
hgba1c acceptable, minimize simple carbs. Increase exercise as tolerated.  

## 2022-05-29 NOTE — Assessment & Plan Note (Signed)
Supplement and monitor 

## 2022-05-29 NOTE — Assessment & Plan Note (Signed)
On Levothyroxine, continue to monitor 

## 2022-05-29 NOTE — Assessment & Plan Note (Signed)
Encourage heart healthy diet such as MIND or DASH diet, increase exercise, avoid trans fats, simple carbohydrates and processed foods, consider a krill or fish or flaxseed oil cap daily.  °

## 2022-05-29 NOTE — Assessment & Plan Note (Signed)
Well controlled, no changes to meds. Encouraged heart healthy diet such as the DASH diet and exercise as tolerated.  °

## 2022-05-30 ENCOUNTER — Ambulatory Visit (INDEPENDENT_AMBULATORY_CARE_PROVIDER_SITE_OTHER): Payer: Medicare Other | Admitting: Family Medicine

## 2022-05-30 ENCOUNTER — Ambulatory Visit (HOSPITAL_BASED_OUTPATIENT_CLINIC_OR_DEPARTMENT_OTHER)
Admission: RE | Admit: 2022-05-30 | Discharge: 2022-05-30 | Disposition: A | Discharge status: Home / Self Care | Payer: Medicare Other | Source: Ambulatory Visit | Attending: Family Medicine | Admitting: Family Medicine

## 2022-05-30 VITALS — BP 110/64 | HR 57 | Temp 98.0°F | Resp 16 | Ht 61.0 in | Wt 235.4 lb

## 2022-05-30 DIAGNOSIS — R739 Hyperglycemia, unspecified: Secondary | ICD-10-CM

## 2022-05-30 DIAGNOSIS — M109 Gout, unspecified: Secondary | ICD-10-CM

## 2022-05-30 DIAGNOSIS — E039 Hypothyroidism, unspecified: Secondary | ICD-10-CM | POA: Diagnosis not present

## 2022-05-30 DIAGNOSIS — R059 Cough, unspecified: Secondary | ICD-10-CM

## 2022-05-30 DIAGNOSIS — R0602 Shortness of breath: Secondary | ICD-10-CM | POA: Diagnosis not present

## 2022-05-30 DIAGNOSIS — E559 Vitamin D deficiency, unspecified: Secondary | ICD-10-CM

## 2022-05-30 DIAGNOSIS — M8580 Other specified disorders of bone density and structure, unspecified site: Secondary | ICD-10-CM

## 2022-05-30 DIAGNOSIS — I10 Essential (primary) hypertension: Secondary | ICD-10-CM | POA: Diagnosis not present

## 2022-05-30 DIAGNOSIS — M810 Age-related osteoporosis without current pathological fracture: Secondary | ICD-10-CM

## 2022-05-30 DIAGNOSIS — R053 Chronic cough: Secondary | ICD-10-CM | POA: Diagnosis not present

## 2022-05-30 DIAGNOSIS — E785 Hyperlipidemia, unspecified: Secondary | ICD-10-CM | POA: Diagnosis not present

## 2022-05-30 NOTE — Progress Notes (Signed)
Subjective:   By signing my name below, I, Barrett Shell, attest that this documentation has been prepared under the direction and in the presence of Bradd Canary, MD. 05/30/2022   Patient ID: Amanda Copeland, female    DOB: 09-03-1940, 82 y.o.   MRN: [id number]  No chief complaint on file.   HPI Patient is in today for a traditional medicare physical. She is accompanied by her husband during this visit.    Acute She denies having any recent illness or ED visits. She denies having any chest pain, palpitations, or GI issues. She also denies having any bowel or urinary issues.   Fall  She fell to the ground recently in a parking lot while holding her husband's hand. She has loss of balance occasionally. She uses a cane when she is by herself or a walker when she really needs it. No recent gout flare ups   Vitamin D Her vitamin D levels were low during her last blood work.  Lab Results  Component Value Date   VD25OH 28.34 (L) 11/25/2021    Right arm pain She complains of ongoing pain in her right arm. She also complains of pain from her neck to head. She sometimes uses moist heat to manage it.   Cough  She has had an intermittent cough for the past 6 months. She denies having any wheezing or SOB. She denies burning in her chest or an acidic taste in her mouth.   Colonoscopy Last colonoscopy completed on 11/15/2012. A few diverticula found in the mid sigmoid colon, otherwise results are normal. Repeat in 10 years.   Dexa Last Dexa completed on 12/03/2020. The BMD measured at Femur Neck Left is 0.727 g/cm2 with a T-score of -2.2. This patient is considered osteopenic according to World Health Organization [facility]) criteria. Compared with the prior study on, 06/08/2017 the BMD of the total mean shows a statistically significant decrease.  Mammogram Last mammogram completed on 06/09/2017. Results are normal. Repeat in 1 year.  Past Medical History:  Diagnosis Date   Arthritis     Asthma    childhood now returning   Back pain affecting pregnancy 11/02/2014   Benign essential tremor 10/28/2014   Bilateral dry eyes    Breast cancer ([facility])    right   Cancer ([facility])    Breast   Dry eyes    Encounter for Medicare annual wellness exam 05/22/2016   Essential hypertension, benign 10/28/2014   H/O measles    H/O mumps    History of chicken pox    Lumbago 11/02/2014   Mixed hyperlipidemia 10/28/2014   Obesity 10/28/2014   Stenosis of cervical spine     Past Surgical History:  Procedure Laterality Date   BACK SURGERY     BREAST REDUCTION SURGERY Left    CHOLECYSTECTOMY     EYE SURGERY  2008   b/l cataracts removed, in [place]   MASTECTOMY Right    REDUCTION MAMMAPLASTY     REPLACEMENT TOTAL KNEE BILATERAL Bilateral    ROTATOR CUFF REPAIR Left     Family History  Problem Relation Age of Onset   Heart disease Mother    Heart attack Mother    Heart failure Mother    Hypertension Mother    Cancer Father        colon   Asthma Father    Parkinson's disease Father    Cancer Maternal Grandmother        uterine   Heart  disease Maternal Grandfather    Obesity Daughter    Appendicitis Paternal Grandmother    Stroke Neg Hx     Social History   Socioeconomic History   Marital status: Married    Spouse name: Not on file   Number of children: 1   Years of education: Not on file   Highest education level: Master's degree (e.g., MA, MS, MEng, MEd, MSW, MBA)  Occupational History   Occupation: retired    Comment: Runner, 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)  Tobacco Use   Smoking status: Never   Smokeless tobacco: Never  Vaping Use   Vaping Use: Never used  Substance and Sexual Activity   Alcohol use: Yes    Alcohol/week: 0.0 standard drinks of alcohol    Comment: two times a month   Drug use: No   Sexual activity: Not on file    Comment: lives with husband, moved [place], no dietary restrictions, retired Engineer, site  Other Topics Concern   Not on file  Social History  Narrative   Pt lives with Spouse at home 1 daughter   Drinks coffee, and soda   Right handed   Social Determinants of Health   Financial Resource Strain: Not on file  Food Insecurity: Not on file  Transportation Needs: Not on file  Physical Activity: Not on file  Stress: Not on file  Social Connections: Not on file  Intimate Partner Violence: Not on file    Outpatient Medications Prior to Visit  Medication Sig Dispense Refill   albuterol (PROVENTIL HFA;VENTOLIN HFA) 108 (90 Base) MCG/ACT inhaler INHALE 2 PUFFS INTO THE LUNGS EVERY 4 (FOUR) HOURS AS NEEDED FOR WHEEZING OR SHORTNESS OF BREATH. 6.7 Inhaler 1   allopurinol (ZYLOPRIM) 100 MG tablet TAKE 2 TABLETS BY MOUTH EVERY DAY 180 tablet 1   AMBULATORY NON FORMULARY MEDICATION Rollator  Dx: Gait instability 1 Device 0   amLODipine (NORVASC) 5 MG tablet TAKE 1 TABLET (5 MG TOTAL) BY MOUTH DAILY. 90 tablet 3   Ascorbic Acid (VITAMIN C PO) Take 1 tablet by mouth daily.      aspirin 81 MG tablet Take 81 mg by mouth daily.     colchicine 0.6 MG tablet Take 2 tabs at first and then 1 tab q 2 hours til pain relief or max of 6 tabs in 24 hours (Patient taking differently: Take 2 tabs at first and then 1 tab q 2 hours til pain relief or max of 6 tabs in 24 hours - PRN for gout flare-up) 6 tablet 1   doxycycline (VIBRA-TABS) 100 MG tablet Take 1 tablet (100 mg total) by mouth 2 (two) times daily. 20 tablet 0   DYMISTA 137-50 MCG/ACT SUSP 1-2 SPRAYS PER NOSTRIL TWICE DAILY FOR RUNNY NOSE 69 g 1   ezetimibe (ZETIA) 10 MG tablet TAKE 1 TABLET BY MOUTH EVERY DAY 90 tablet 1   famotidine (PEPCID) 40 MG tablet TAKE 1 TABLET BY MOUTH EVERYDAY AT BEDTIME 90 tablet 1   fluticasone (FLOVENT HFA) 110 MCG/ACT inhaler 2 PUFFS TWICE DAILY TO PREVENT COUGHING OR WHEEZING. MAX INS WILL COVER IS A 30 DAY 36 each 1   furosemide (LASIX) 40 MG tablet TAKE 1 TABLET BY MOUTH EVERY DAY 90 tablet 0   KLOR-CON M20 20 MEQ tablet TAKE 1 TABLET BY MOUTH DAILY. KEEP  UPCOMING APPOINTMENT 90 tablet 3   levothyroxine (SYNTHROID) 112 MCG tablet TAKE 1 TABLET BY MOUTH EVERY DAY BEFORE BREAKFAST 90 tablet 1   loratadine (CLARITIN) 10 MG tablet TAKE  1 TABLET BY MOUTH EVERY DAY 90 tablet 1   Multiple Vitamins-Minerals (ICAPS AREDS 2) CAPS Take 1 capsule by mouth 2 (two) times daily.     PREVACID 30 MG capsule TAKE 1 CAPSULE BY MOUTH TWICE A DAY --MAX LIMIT 180 capsule 1   promethazine-dextromethorphan (PROMETHAZINE-DM) 6.25-15 MG/5ML syrup Take 2.5-5 mLs by mouth 3 (three) times daily as needed for cough. 160 mL 0   propranolol (INDERAL) 60 MG tablet Take 1 tablet (60 mg total) by mouth 2 (two) times daily. 180 tablet 3   Red Yeast Rice Extract (RED YEAST RICE PO) Take by mouth daily.     Vitamin D, Ergocalciferol, (DRISDOL) 1.25 MG (50000 UNIT) CAPS capsule TAKE 1 CAPSULE (50,000 UNITS TOTAL) BY MOUTH EVERY 7 (SEVEN) DAYS 4 capsule 4   No facility-administered medications prior to visit.    Allergies  Allergen Reactions   Amoxicillin Rash   Ampicillin Rash   Penicillins Rash   Statins Other (See Comments)    Per reports muscle aches and joint pain Per reports muscle aches and joint pain    Review of Systems  Respiratory:  Positive for cough. Negative for shortness of breath and wheezing.   Cardiovascular:  Negative for chest pain and palpitations.  Gastrointestinal:        (-) GI issues (-) bowel issues  Genitourinary:        (-) urinary issues  Musculoskeletal:  Falls: one fall.       Objective:    Physical Exam Constitutional:      General: She is not in acute distress.    Appearance: Normal appearance.  HENT:     Head: Normocephalic and atraumatic.     Right Ear: Tympanic membrane, ear canal and external ear normal.     Left Ear: Tympanic membrane, ear canal and external ear normal.     Mouth/Throat:     Mouth: Mucous membranes are moist.     Pharynx: Oropharynx is clear.  Cardiovascular:     Rate and Rhythm: Normal rate and regular  rhythm.     Heart sounds: Normal heart sounds. No murmur heard.    No gallop.  Pulmonary:     Effort: Pulmonary effort is normal. No respiratory distress.     Breath sounds: Normal breath sounds. No wheezing or rales.  Skin:    General: Skin is warm.  Neurological:     Mental Status: She is alert and oriented to person, place, and time.  Psychiatric:        Judgment: Judgment normal.     There were no vitals taken for this visit. Wt Readings from Last 3 Encounters:  04/14/22 237 lb 9.6 oz (107.8 kg)  01/27/22 236 lb (107 kg)  12/13/21 236 lb 3.2 oz (107.1 kg)       Assessment & Plan:  Essential hypertension, benign  Acute gout of right foot, unspecified cause  Hyperglycemia  Hyperlipidemia, unspecified hyperlipidemia type  Hypothyroidism, unspecified type  Osteoporosis, unspecified osteoporosis type, unspecified pathological fracture presence  Vitamin D deficiency    I, Barrett Shell, personally preformed the services described in this documentation.  All medical record entries made by the scribe were at my direction and in my presence.  I have reviewed the chart and discharge instructions (if applicable) and agree that the record reflects my personal performance and is accurate and complete. 05/30/2022  Barrett Shell  [facility] [facility] as a scribe for Danise Edge, MD.,have documented all relevant documentation on the  behalf of Danise Edge, MD,as directed by  Danise Edge, MD while in the presence of Danise Edge, MD.

## 2022-05-30 NOTE — Patient Instructions (Signed)
Cough, Adult Coughing is a reflex that clears your throat and airways (respiratory system). It helps heal and protect your lungs. It is normal to cough from time to time. A cough that happens with other symptoms or that lasts a long time may be a sign of a condition that needs treatment. A short-term (acute) cough may only last 2-3 weeks. A long-term (chronic) cough may last 8 or more weeks. Coughing is often caused by: Diseases, such as: An infection of the respiratory system. Asthma or other heart or lung diseases. Gastroesophageal reflux. This is when acid comes back up from the stomach. Breathing in things that irritate your lungs. Allergies. Postnasal drip. This is when mucus runs down the back of your throat. Smoking. Some medicines. Follow these instructions at home: Medicines Take over-the-counter and prescription medicines only as told by your health care provider. Talk with your provider before you take cough medicine (cough suppressants). Eating and drinking Do not drink alcohol. Avoid caffeine. Drink enough fluid to keep your pee (urine) pale yellow. Lifestyle Avoid cigarette smoke. Do not use any products that contain nicotine or tobacco. These products include cigarettes, chewing tobacco, and vaping devices, such as e-cigarettes. If you need help quitting, ask your provider. Avoid things that make you cough. These may include perfumes, candles, cleaning products, or campfire smoke. General instructions  Watch for any changes to your cough. Tell your provider about them. Always cover your mouth when you cough. If the air is dry in your bedroom or home, use a cool mist vaporizer or humidifier. If your cough is worse at night, try to sleep in a semi-upright position. Rest as needed. Contact a health care provider if: You have new symptoms, or your symptoms get worse. You cough up pus. You have a fever that does not go away or a cough that does not get better after 2-3  weeks. You cannot control your cough with medicine, and you are losing sleep. You have pain that gets worse or is not helped with medicine. You lose weight for no clear reason. You have night sweats. Get help right away if: You cough up blood. You have trouble breathing. Your heart is beating very fast. These symptoms may be an emergency. Get help right away. Call 911. Do not wait to see if the symptoms will go away. Do not drive yourself to the hospital. This information is not intended to replace advice given to you by your health care provider. Make sure you discuss any questions you have with your health care provider. Document Revised: 09/24/2021 Document Reviewed: 09/24/2021 Elsevier Patient Education  2023 [employer]  

## 2022-05-31 LAB — COMPREHENSIVE METABOLIC PANEL
ALT: 9 U/L (ref 0–35)
AST: 16 U/L (ref 0–37)
Albumin: 4 g/dL (ref 3.5–5.2)
Alkaline Phosphatase: 70 U/L (ref 39–117)
BUN: 22 mg/dL (ref 6–23)
CO2: 30 mEq/L (ref 19–32)
Calcium: 9.3 mg/dL (ref 8.4–10.5)
Chloride: 101 mEq/L (ref 96–112)
Creatinine, Ser: 0.91 mg/dL (ref 0.40–1.20)
GFR: 58.77 mL/min — ABNORMAL LOW (ref >60.00)
Glucose, Bld: 87 mg/dL (ref 70–99)
Potassium: 4.4 mEq/L (ref 3.5–5.1)
Sodium: 140 mEq/L (ref 135–145)
Total Bilirubin: 0.4 mg/dL (ref 0.2–1.2)
Total Protein: 6.5 g/dL (ref 6.0–8.3)

## 2022-05-31 LAB — CBC WITH DIFFERENTIAL/PLATELET
Basophils Absolute: 0.1 10*3/uL (ref 0.0–0.1)
Basophils Relative: 1.1 % (ref 0.0–3.0)
Eosinophils Absolute: 0.3 10*3/uL (ref 0.0–0.7)
Eosinophils Relative: 3.3 % (ref 0.0–5.0)
HCT: 40.3 % (ref 36.0–46.0)
Hemoglobin: 13.3 g/dL (ref 12.0–15.0)
Lymphocytes Relative: 18.7 % (ref 12.0–46.0)
Lymphs Abs: 1.7 10*3/uL (ref 0.7–4.0)
MCHC: 33.1 g/dL (ref 30.0–36.0)
MCV: 96.3 fl (ref 78.0–100.0)
Monocytes Absolute: 0.9 10*3/uL (ref 0.1–1.0)
Monocytes Relative: 10.2 % (ref 3.0–12.0)
Neutro Abs: 5.9 10*3/uL (ref 1.4–7.7)
Neutrophils Relative %: 66.7 % (ref 43.0–77.0)
Platelets: 325 10*3/uL (ref 150.0–400.0)
RBC: 4.18 Mil/uL (ref 3.87–5.11)
RDW: 15.8 % — ABNORMAL HIGH (ref 11.5–15.5)
WBC: 8.8 10*3/uL (ref 4.0–10.5)

## 2022-05-31 LAB — VITAMIN D 25 HYDROXY (VIT D DEFICIENCY, FRACTURES): VITD: 34.71 ng/mL (ref 30.00–100.00)

## 2022-05-31 LAB — TSH: TSH: 2.32 u[IU]/mL (ref 0.35–5.50)

## 2022-05-31 LAB — LIPID PANEL
Cholesterol: 178 mg/dL (ref 0–200)
HDL: 49.1 mg/dL (ref >39.00)
LDL Cholesterol: 98 mg/dL (ref 0–99)
NonHDL: 128.98
Total CHOL/HDL Ratio: 4
Triglycerides: 155 mg/dL — ABNORMAL HIGH (ref 0.0–149.0)
VLDL: 31 mg/dL (ref 0.0–40.0)

## 2022-05-31 LAB — HEMOGLOBIN A1C: Hgb A1c MFr Bld: 5.7 % (ref 4.6–6.5)

## 2022-05-31 LAB — URIC ACID: Uric Acid, Serum: 4.2 mg/dL (ref 2.4–7.0)

## 2022-06-02 ENCOUNTER — Other Ambulatory Visit: Payer: Self-pay | Admitting: Family Medicine

## 2022-06-02 DIAGNOSIS — J42 Unspecified chronic bronchitis: Secondary | ICD-10-CM

## 2022-06-02 DIAGNOSIS — R059 Cough, unspecified: Secondary | ICD-10-CM

## 2022-06-08 ENCOUNTER — Ambulatory Visit (INDEPENDENT_AMBULATORY_CARE_PROVIDER_SITE_OTHER): Payer: Medicare Other | Admitting: Family Medicine

## 2022-06-08 ENCOUNTER — Encounter: Payer: Self-pay | Admitting: Family Medicine

## 2022-06-08 VITALS — BP 122/72 | HR 54 | Temp 97.6°F | Ht 61.0 in | Wt 235.0 lb

## 2022-06-08 DIAGNOSIS — R059 Cough, unspecified: Secondary | ICD-10-CM

## 2022-06-08 MED ORDER — HYDROCOD POLI-CHLORPHE POLI ER 10-8 MG/5ML PO SUER
5.0000 mL | Freq: Every evening | ORAL | 0 refills | Status: AC | PRN
Start: 2022-06-08 — End: 2022-06-13

## 2022-06-08 MED ORDER — BENZONATATE 100 MG PO CAPS
100.0000 mg | ORAL_CAPSULE | Freq: Two times a day (BID) | ORAL | 0 refills | Status: DC | PRN
Start: 2022-06-08 — End: 2022-08-23

## 2022-06-08 MED ORDER — DOXYCYCLINE HYCLATE 100 MG PO TABS
100.0000 mg | ORAL_TABLET | Freq: Two times a day (BID) | ORAL | 0 refills | Status: AC
Start: 2022-06-08 — End: 2022-06-15

## 2022-06-08 NOTE — Progress Notes (Signed)
Acute Office Visit  Subjective:     Patient ID: Amanda Copeland, female    DOB: 01-15-1941, 82 y.o.   MRN: [id number]  Chief Complaint  Patient presents with   Cough     Patient is in today for worsening cough.  She has been having an intermittent productive cough for the past 6 months. She got some yellow sputum out yesterday, but otherwise can have a hard time getting it all the way up. She does have some occasional weheezing and shortness of breath. No weight loss, fevers, chills. No history of smoking. She has not had any treatment. States she mentioned to PCP last Friday and they placed pulmonology referral and CXR  (chronich bronchitic changes). States that cough has worsened since the weekend. So far she has been taking OTC cough syrup and mucinex - but hasn't noticed any improvement. She has an albuterol inhaler, but hasn't been using it. She denies symptoms of reflux and is currently on Pepcid and Prevacid. Denies chest pain, fevers, chills, body aches, GI/GU symptoms.     All review of systems negative except what is listed in the HPI      Objective:    BP 122/72   Pulse (!) 54   Temp 97.6 F (36.4 C) (Oral)   Ht 5\' 1"  (1.549 m)   Wt 235 lb (106.6 kg)   SpO2 96%   BMI 44.40 kg/m    Physical Exam Vitals reviewed.  Constitutional:      Appearance: Normal appearance.  Cardiovascular:     Rate and Rhythm: Normal rate and regular rhythm.     Pulses: Normal pulses.     Heart sounds: Normal heart sounds.  Pulmonary:     Effort: Pulmonary effort is normal.     Comments: Productive cough, mildly diminished breath sounds throughout  Skin:    General: Skin is warm and dry.  Neurological:     Mental Status: She is alert and oriented to person, place, and time.  Psychiatric:        Mood and Affect: Mood normal.        Behavior: Behavior normal.        Thought Content: Thought content normal.        Judgment: Judgment normal.     No results found for any visits  on 06/08/22.      Assessment & Plan:   Problem List Items Addressed This Visit     Cough - Primary Trying doxycycline, Tessalon, and Tussionex. Continue supportive measures including rest, hydration, humidifier use, steam showers, warm compresses to sinuses, warm liquids with lemon and honey, and over-the-counter cough, cold, and analgesics as needed.  Keep upcoming pulmonology appointment.    Relevant Medications   benzonatate (TESSALON) 100 MG capsule   doxycycline (VIBRA-TABS) 100 MG tablet   chlorpheniramine-HYDROcodone (TUSSIONEX) 10-8 MG/5ML    Meds ordered this encounter  Medications   benzonatate (TESSALON) 100 MG capsule    Sig: Take 1 capsule (100 mg total) by mouth 2 (two) times daily as needed for cough.    Dispense:  20 capsule    Refill:  0    Order Specific Question:   Supervising Provider    Answer:   Danise Edge A [[id number]]   doxycycline (VIBRA-TABS) 100 MG tablet    Sig: Take 1 tablet (100 mg total) by mouth 2 (two) times daily for 7 days.    Dispense:  14 tablet    Refill:  0    Order Specific  Question:   Supervising Provider    Answer:   Danise Edge A [[id number]]   chlorpheniramine-HYDROcodone (TUSSIONEX) 10-8 MG/5ML    Sig: Take 5 mLs by mouth at bedtime as needed for up to 5 days.    Dispense:  25 mL    Refill:  0    Order Specific Question:   Supervising Provider    Answer:   Danise Edge A [[id number]]    Return if symptoms worsen or fail to improve.  Clayborne Dana, NP

## 2022-06-08 NOTE — Patient Instructions (Signed)
Trying doxycycline, Tessalon, and Tussionex. Continue supportive measures including rest, hydration, humidifier use, steam showers, warm compresses to sinuses, warm liquids with lemon and honey, and over-the-counter cough, cold, and analgesics as needed.  Keep upcoming pulmonology appointment.    Please contact office for follow-up if symptoms do not improve or worsen. Seek emergency care if symptoms become severe.

## 2022-06-11 NOTE — Telephone Encounter (Signed)
Prolia VOB initiated via [email or web address]  Last Prolia inj 01/27/22 Next Prolia inj due 07/30/22

## 2022-06-13 ENCOUNTER — Telehealth: Payer: Self-pay | Admitting: Physical Medicine and Rehabilitation

## 2022-06-13 NOTE — Telephone Encounter (Signed)
Patient needs an appointment for an injection in her back

## 2022-06-14 ENCOUNTER — Ambulatory Visit (INDEPENDENT_AMBULATORY_CARE_PROVIDER_SITE_OTHER): Payer: Medicare Other | Admitting: Family Medicine

## 2022-06-14 ENCOUNTER — Encounter: Payer: Self-pay | Admitting: Family Medicine

## 2022-06-14 ENCOUNTER — Other Ambulatory Visit: Payer: Self-pay | Admitting: Physical Medicine and Rehabilitation

## 2022-06-14 VITALS — BP 140/80 | HR 53 | Temp 98.1°F | Ht 61.0 in | Wt 235.0 lb

## 2022-06-14 DIAGNOSIS — I10 Essential (primary) hypertension: Secondary | ICD-10-CM | POA: Diagnosis not present

## 2022-06-14 DIAGNOSIS — M48062 Spinal stenosis, lumbar region with neurogenic claudication: Secondary | ICD-10-CM

## 2022-06-14 DIAGNOSIS — M5416 Radiculopathy, lumbar region: Secondary | ICD-10-CM

## 2022-06-14 DIAGNOSIS — J42 Unspecified chronic bronchitis: Secondary | ICD-10-CM | POA: Diagnosis not present

## 2022-06-14 MED ORDER — TRELEGY ELLIPTA 100-62.5-25 MCG/ACT IN AEPB
1.0000 | INHALATION_SPRAY | Freq: Every day | RESPIRATORY_TRACT | 11 refills | Status: DC
Start: 2022-06-14 — End: 2022-06-14

## 2022-06-14 MED ORDER — HYDROCOD POLI-CHLORPHE POLI ER 10-8 MG/5ML PO SUER
5.0000 mL | Freq: Two times a day (BID) | ORAL | 0 refills | Status: DC | PRN
Start: 2022-06-14 — End: 2023-06-07

## 2022-06-14 MED ORDER — ALBUTEROL SULFATE HFA 108 (90 BASE) MCG/ACT IN AERS
2.0000 | INHALATION_SPRAY | Freq: Four times a day (QID) | RESPIRATORY_TRACT | 2 refills | Status: DC | PRN
Start: 2022-06-14 — End: 2022-06-14

## 2022-06-14 MED ORDER — PREDNISONE 10 MG PO TABS
ORAL_TABLET | ORAL | 0 refills | Status: DC
Start: 2022-06-14 — End: 2022-06-23

## 2022-06-14 MED ORDER — METHYLPREDNISOLONE ACETATE 80 MG/ML IJ SUSP
80.0000 mg | Freq: Once | INTRAMUSCULAR | Status: AC
Start: 2022-06-14 — End: 2022-06-14
  Administered 2022-06-14: 80 mg via INTRAMUSCULAR

## 2022-06-14 MED ORDER — AZITHROMYCIN 250 MG PO TABS
ORAL_TABLET | ORAL | 0 refills | Status: DC
Start: 2022-06-14 — End: 2022-06-23

## 2022-06-14 NOTE — Assessment & Plan Note (Signed)
Reviewed cxr from a few weeks ago Con't albuterol and flovet  Depo medrol 80 mg IM Pred taper

## 2022-06-14 NOTE — Patient Instructions (Signed)
Chronic Bronchitis, Adult  Chronic bronchitis is inflammation inside of the main airways (bronchi) that come off the windpipe (trachea) in the lungs. The swelling causes the airways to narrow and make more mucus than normal. This can make it hard to breathe and may cause coughing or noisy breathing (wheezing). This condition is a type of chronic obstructive pulmonary disease (COPD). Chronic bronchitis is often associated with other chronic respiratory conditions, such as emphysema, asthma, bronchiectasis, or cystic fibrosis. Chronic bronchitis is a long-term (chronic) condition. It is defined as a chronic cough with mucus (sputum) production: For at least 3 months of the year. For 2 years in a row. People with chronic bronchitis are more likely to get colds and other infections in the nose, throat, or airways. What are the causes? This condition is most often caused by: A history of smoking. Exposure to secondhand smoke or a smoky area for a long period of time. Frequent lung infections. Long-term exposure to certain fumes or chemicals that irritate the lungs. What are the signs or symptoms? Symptoms of chronic bronchitis may include: A cough that brings up mucus (productive cough). A whistling sound when you breathe (wheezing). Shortness of breath. Chest tightness or soreness. Fever or chills. Colds or respiratory infections that go away and return. How is this diagnosed? Your health care provider may diagnose this condition based on your signs and symptoms, especially if you have a cough that lasts a long time or keeps coming back. This condition may be diagnosed based on: Your symptoms and medical history. A physical exam, including listening to your lungs. Tests, such as: Testing a sputum sample. Blood tests. A chest X-ray. Tests of lung (pulmonary) function. How is this treated? There is no cure for chronic bronchitis. Treatment may help control your symptoms. This  includes: Drinking fluids. This may help thin your mucus so it is easier to cough up. Mucus-clearing techniques. Your health care provider will show you which techniques are best for you. Medicines such as: Inhaled medicine (inhaler) to improve air flow in and out of your lungs. Antibiotics to treat or prevent bacterial lung infections. Mucus-thinning medicines. Pulmonary rehabilitation. This is a program that helps you learn how to manage your breathing problem. The program may include exercise, education, counseling, treatment, and support. Using oxygen therapy, if your blood oxygen level is very low. Follow these instructions at home: Medicines Take over-the-counter and prescription medicines only as told by your health care provider. If you were prescribed an antibiotic medicine, take it as told by your health care provider. Do not stop taking the antibiotic even if you start to feel better. Lifestyle  Do not use any products that contain nicotine or tobacco. These products include cigarettes, chewing tobacco, and vaping devices, such as e-cigarettes. If you need help quitting, ask your health care provider. Stay away from other people's smoke (secondhand smoke) and any irritants that make you cough more, such as chemical fumes. Eat a healthy diet and get regular exercise. Talk with your health care provider about what activities are safe for you. Return to normal activities as told by your health care provider. Ask your health care provider what activities are safe for you. Preventing infections Stay up to date on all immunizations, including the pneumonia and flu vaccines. Wash your hands often with soap and water for at least 20 seconds. If soap and water are not available, use hand sanitizer. Avoid contact with people who have symptoms of a cold or the flu. Keep   your environment free from any known allergens such as dust, mold, pets, and pollen. General instructions Get plenty of  rest. Drink enough fluids to keep your urine pale yellow. Use oxygen therapy at home as directed. Follow instructions from your health care provider about how to use oxygen safely and take steps to prevent fire. Do not smoke while using oxygen or allow others to smoke in your home. Keep all follow-up visits. This is important. Contact a health care provider if: Your shortness of breath or coughing gets worse even when you take medicine. Your mucus gets thicker or changes color. You are not able to cough up your mucus. You have a fever. Get help right away if: You cough up blood. You have trouble breathing. You have chest pain. You feel dizzy or confused. These symptoms may represent a serious problem that is an emergency. Do not wait to see if the symptoms will go away. Get medical help right away. Call your local emergency services (911 in the [place] Do not drive yourself to the hospital. Summary Chronic bronchitis is inflammation inside of the main airways (bronchi) that come off the windpipe (trachea) in the lungs. The swelling causes the airways to narrow and make more mucus than normal. Chronic bronchitis is a long-term (chronic) condition. It is defined as a chronic cough with mucus (sputum) production for at least 3 months of the year for 2 years in a row. If you were prescribed an antibiotic medicine, take it as told by your health care provider. Do not stop taking the antibiotic even if you start to feel better. Do not use any products that contain nicotine or tobacco. These products include cigarettes, chewing tobacco, and vaping devices, such as e-cigarettes. If you need help quitting, ask your health care provider. This information is not intended to replace advice given to you by your health care provider. Make sure you discuss any questions you have with your health care provider. Document Revised: 05/27/2020 Document Reviewed: 05/27/2020 Elsevier Patient Education  2023  Elsevier Inc.  

## 2022-06-14 NOTE — Progress Notes (Addendum)
Subjective:   By signing my name below, I, Shehryar Baig, attest that this documentation has been prepared under the direction and in the presence of Donato Schultz, DO. 06/14/2022   Patient ID: Amanda Copeland, female    DOB: November 19, 1940, 82 y.o.   MRN: [id number]  Chief Complaint  Patient presents with   Cough    Pt states having SOB and states sxs have worsened since visit with Ladona Ridgel. Pt has 1 pill of antibiotic and Tessalon left   Follow-up    Cough Associated symptoms include shortness of breath. Pertinent negatives include no chest pain, fever, headaches, rash or sore throat. There is no history of environmental allergies.   Patient is in today for a follow up visit. She is present with her husband during this visit.   She complains of cough for the past couple of months. She also has wheezing, chest congestion, and head congestion. She is coughing up clear or yellow mucous. Her symptoms worsened over the past week. Her husband reports she gets in coughing episodes that can last around 1 hour. She has seen another provider and was given Tessalon Perles and doxycycline and has no change in her symptoms. She was recommended to follow up with a pulmonologist by her PCP. She denies having sore throat while swallowing. She has no history of smoking but notes her father smoked around her when she was a child. She is taking Flovent and albuterol to manage her wheezing. She has tried mucinex DM to manage her congestion and found no change in her symptoms.   Past Medical History:  Diagnosis Date   Arthritis    Asthma    childhood now returning   Back pain affecting pregnancy 11/02/2014   Benign essential tremor 10/28/2014   Bilateral dry eyes    Breast cancer (HCC)    right   Cancer (HCC)    Breast   Dry eyes    Encounter for Medicare annual wellness exam 05/22/2016   Essential hypertension, benign 10/28/2014   H/O measles    H/O mumps    History of chicken pox    Lumbago 11/02/2014    Mixed hyperlipidemia 10/28/2014   Obesity 10/28/2014   Stenosis of cervical spine     Past Surgical History:  Procedure Laterality Date   BACK SURGERY     BREAST REDUCTION SURGERY Left    CHOLECYSTECTOMY     EYE SURGERY  2008   b/l cataracts removed, in [place]   MASTECTOMY Right    REDUCTION MAMMAPLASTY     REPLACEMENT TOTAL KNEE BILATERAL Bilateral    ROTATOR CUFF REPAIR Left     Family History  Problem Relation Age of Onset   Heart disease Mother    Heart attack Mother    Heart failure Mother    Hypertension Mother    Cancer Father        colon   Asthma Father    Parkinson's disease Father    Cancer Maternal Grandmother        uterine   Heart disease Maternal Grandfather    Obesity Daughter    Appendicitis Paternal Grandmother    Stroke Neg Hx     Social History   Socioeconomic History   Marital status: Married    Spouse name: Not on file   Number of children: 1   Years of education: Not on file   Highest education level: Master's degree (e.g., MA, MS, MEng, MEd, MSW, MBA)  Occupational History  Occupation: retired    Comment: Runner, 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)  Tobacco Use   Smoking status: Never   Smokeless tobacco: Never  Vaping Use   Vaping Use: Never used  Substance and Sexual Activity   Alcohol use: Yes    Alcohol/week: 0.0 standard drinks of alcohol    Comment: two times a month   Drug use: No   Sexual activity: Not on file    Comment: lives with husband, moved [place], no dietary restrictions, retired Engineer, site  Other Topics Concern   Not on file  Social History Narrative   Pt lives with Spouse at home 1 daughter   Drinks coffee, and soda   Right handed   Social Determinants of Health   Financial Resource Strain: Not on file  Food Insecurity: Not on file  Transportation Needs: Not on file  Physical Activity: Not on file  Stress: Not on file  Social Connections: Not on file  Intimate Partner Violence: Not on file    Outpatient  Medications Prior to Visit  Medication Sig Dispense Refill   albuterol (PROVENTIL HFA;VENTOLIN HFA) 108 (90 Base) MCG/ACT inhaler INHALE 2 PUFFS INTO THE LUNGS EVERY 4 (FOUR) HOURS AS NEEDED FOR WHEEZING OR SHORTNESS OF BREATH. 6.7 Inhaler 1   allopurinol (ZYLOPRIM) 100 MG tablet TAKE 2 TABLETS BY MOUTH EVERY DAY 180 tablet 1   AMBULATORY NON FORMULARY MEDICATION Rollator  Dx: Gait instability 1 Device 0   amLODipine (NORVASC) 5 MG tablet TAKE 1 TABLET (5 MG TOTAL) BY MOUTH DAILY. 90 tablet 3   Ascorbic Acid (VITAMIN C PO) Take 1 tablet by mouth daily.      aspirin 81 MG tablet Take 81 mg by mouth daily.     benzonatate (TESSALON) 100 MG capsule Take 1 capsule (100 mg total) by mouth 2 (two) times daily as needed for cough. 20 capsule 0   colchicine 0.6 MG tablet Take 2 tabs at first and then 1 tab q 2 hours til pain relief or max of 6 tabs in 24 hours (Patient taking differently: Take 2 tabs at first and then 1 tab q 2 hours til pain relief or max of 6 tabs in 24 hours - PRN for gout flare-up) 6 tablet 1   doxycycline (VIBRA-TABS) 100 MG tablet Take 1 tablet (100 mg total) by mouth 2 (two) times daily for 7 days. 14 tablet 0   DYMISTA 137-50 MCG/ACT SUSP 1-2 SPRAYS PER NOSTRIL TWICE DAILY FOR RUNNY NOSE 69 g 1   ezetimibe (ZETIA) 10 MG tablet TAKE 1 TABLET BY MOUTH EVERY DAY 90 tablet 1   famotidine (PEPCID) 40 MG tablet TAKE 1 TABLET BY MOUTH EVERYDAY AT BEDTIME 90 tablet 1   fluticasone (FLOVENT HFA) 110 MCG/ACT inhaler 2 PUFFS TWICE DAILY TO PREVENT COUGHING OR WHEEZING. MAX INS WILL COVER IS A 30 DAY 36 each 1   furosemide (LASIX) 40 MG tablet TAKE 1 TABLET BY MOUTH EVERY DAY 90 tablet 0   KLOR-CON M20 20 MEQ tablet TAKE 1 TABLET BY MOUTH DAILY. KEEP UPCOMING APPOINTMENT 90 tablet 3   levothyroxine (SYNTHROID) 112 MCG tablet TAKE 1 TABLET BY MOUTH EVERY DAY BEFORE BREAKFAST 90 tablet 1   loratadine (CLARITIN) 10 MG tablet TAKE 1 TABLET BY MOUTH EVERY DAY 90 tablet 1   Multiple  Vitamins-Minerals (ICAPS AREDS 2) CAPS Take 1 capsule by mouth 2 (two) times daily.     PREVACID 30 MG capsule TAKE 1 CAPSULE BY MOUTH TWICE A DAY --MAX LIMIT 180 capsule 1  propranolol (INDERAL) 60 MG tablet Take 1 tablet (60 mg total) by mouth 2 (two) times daily. 180 tablet 3   Red Yeast Rice Extract (RED YEAST RICE PO) Take by mouth daily.     Vitamin D, Ergocalciferol, (DRISDOL) 1.25 MG (50000 UNIT) CAPS capsule TAKE 1 CAPSULE (50,000 UNITS TOTAL) BY MOUTH EVERY 7 (SEVEN) DAYS 4 capsule 4   No facility-administered medications prior to visit.    Allergies  Allergen Reactions   Amoxicillin Rash   Ampicillin Rash   Penicillins Rash   Statins Other (See Comments)    Per reports muscle aches and joint pain Per reports muscle aches and joint pain    Review of Systems  Constitutional:  Negative for fever and malaise/fatigue.  HENT:  Negative for congestion and sore throat.        (+)head congestion  Eyes:  Negative for blurred vision.  Respiratory:  Positive for cough, sputum production (clear~yellow) and shortness of breath.        (+)chest congestion  Cardiovascular:  Negative for chest pain, palpitations and leg swelling.  Gastrointestinal:  Negative for abdominal pain, blood in stool and nausea.  Genitourinary:  Negative for dysuria and frequency.  Musculoskeletal:  Negative for falls.  Skin:  Negative for rash.  Neurological:  Negative for dizziness, loss of consciousness and headaches.  Endo/Heme/Allergies:  Negative for environmental allergies.  Psychiatric/Behavioral:  Negative for depression. The patient is not nervous/anxious.        Objective:    Physical Exam Vitals and nursing note reviewed.  Constitutional:      General: She is not in acute distress.    Appearance: Normal appearance. She is not ill-appearing.  HENT:     Head: Normocephalic and atraumatic.     Right Ear: Tympanic membrane, ear canal and external ear normal.     Left Ear: Tympanic membrane,  ear canal and external ear normal.  Eyes:     Extraocular Movements: Extraocular movements intact.     Pupils: Pupils are equal, round, and reactive to light.  Cardiovascular:     Rate and Rhythm: Normal rate and regular rhythm.     Heart sounds: Normal heart sounds. No murmur heard.    No gallop.  Pulmonary:     Effort: Pulmonary effort is normal.     Breath sounds: Wheezing present.  Skin:    General: Skin is warm and dry.  Neurological:     General: No focal deficit present.     Mental Status: She is alert and oriented to person, place, and time.  Psychiatric:        Mood and Affect: Mood normal.        Judgment: Judgment normal.     BP (!) 140/80 (BP Location: Left Arm, Patient Position: Sitting, Cuff Size: Normal)   Pulse (!) 53   Temp 98.1 F (36.7 C) (Oral)   Ht 5\' 1"  (1.549 m)   Wt 235 lb (106.6 kg)   SpO2 95%   BMI 44.40 kg/m  Wt Readings from Last 3 Encounters:  06/14/22 235 lb (106.6 kg)  06/08/22 235 lb (106.6 kg)  05/30/22 235 lb 6.4 oz (106.8 kg)       Assessment & Plan:  Chronic bronchitis, unspecified chronic bronchitis type (HCC) Assessment & Plan: Reviewed cxr from a few weeks ago Con't albuterol and flovet  Depo medrol 80 mg IM Pred taper    Orders: -     Azithromycin; As directed  Dispense: 6 each; Refill: 0 -  predniSONE; TAKE 3 TABLETS PO QD FOR 3 DAYS THEN TAKE 2 TABLETS PO QD FOR 3 DAYS THEN TAKE 1 TABLET PO QD FOR 3 DAYS THEN TAKE 1/2 TAB PO QD FOR 3 DAYS  Dispense: 20 tablet; Refill: 0 -     Hydrocod Poli-Chlorphe Poli ER; Take 5 mLs by mouth every 12 (twelve) hours as needed for cough.  Dispense: 115 mL; Refill: 0 -     methylPREDNISolone Acetate  Essential hypertension, benign    I, Donato Schultz, DO, personally preformed the services described in this documentation.  All medical record entries made by the scribe were at my direction and in my presence.  I have reviewed the chart and discharge instructions (if applicable)  and agree that the record reflects my personal performance and is accurate and complete. 06/14/2022   I,Shehryar Baig,acting as a scribe for Donato Schultz, DO.,have documented all relevant documentation on the behalf of Donato Schultz, DO,as directed by  Donato Schultz, DO while in the presence of Donato Schultz, DO.   Donato Schultz, DO

## 2022-06-14 NOTE — Telephone Encounter (Signed)
Spoke with patient and she is requesting another injection. Last injection was 06/2021 and was very effective until a couple months ago. She did have a fall about a month ago, but didn't hurt her back. Please advise

## 2022-06-17 NOTE — Telephone Encounter (Signed)
Pt ready for scheduling on or after 07/30/22  Out-of-pocket cost due at time of visit: $115 (co-insurance + unmet secondary insurance deductible)  Primary: Medicare Prolia co-insurance: 20% (approximately $302) Admin fee co-insurance: 20% (approximately $25)  Deductible: $240 of $240 met  Secondary: [employer] co-insurance: 20% (approximately $60.40) Admin fee co-insurance: 20% (approximately $5)  Deductible: $0 of $50 met  Prior Auth: NOT required PA# Valid:   ** This summary of benefits is an estimation of the patient's out-of-pocket cost. Exact cost may vary based on individual plan coverage.

## 2022-06-23 ENCOUNTER — Ambulatory Visit (INDEPENDENT_AMBULATORY_CARE_PROVIDER_SITE_OTHER): Payer: Medicare Other | Admitting: Pulmonary Disease

## 2022-06-23 ENCOUNTER — Encounter: Payer: Self-pay | Admitting: Pulmonary Disease

## 2022-06-23 VITALS — BP 118/78 | HR 57 | Ht 61.0 in | Wt 233.0 lb

## 2022-06-23 DIAGNOSIS — J4540 Moderate persistent asthma, uncomplicated: Secondary | ICD-10-CM

## 2022-06-23 MED ORDER — PREDNISONE 10 MG PO TABS: ORAL_TABLET | ORAL | 0 refills | Status: AC

## 2022-06-23 MED ORDER — BREZTRI AEROSPHERE 160-9-4.8 MCG/ACT IN AERO: 2.0000 | INHALATION_SPRAY | Freq: Two times a day (BID) | RESPIRATORY_TRACT | 3 refills | Status: DC

## 2022-06-23 MED ORDER — BREZTRI AEROSPHERE 160-9-4.8 MCG/ACT IN AERO: 2.0000 | INHALATION_SPRAY | Freq: Two times a day (BID) | RESPIRATORY_TRACT | 0 refills | Status: DC

## 2022-06-23 MED ORDER — ALBUTEROL SULFATE HFA 108 (90 BASE) MCG/ACT IN AERS: 2.0000 | INHALATION_SPRAY | RESPIRATORY_TRACT | 11 refills | Status: DC | PRN

## 2022-06-23 NOTE — Progress Notes (Signed)
@Patient  ID: Amanda Copeland, female    DOB: 1940-08-06, 82 y.o.   MRN: [id number]  Chief Complaint  Patient presents with   Consult    Referred by PCP for chronic cough and SOB for the past few months. States she has been unable to cough up any phlegm. The SOB tends to increase with exertion.     Referring provider: Bradd Canary, MD  HPI:   82 y.o. woman whom we are seeing for chronic cough and shortness of breath.  Notably she is established in this pulmonary practice last seen by Jeanmarie Plant NP 2022, previously evaluated by Coralyn Helling most recently 2021.  Notes reviewed.  Most recent PCP note reviewed.  Patient has worsening symptoms over the last several months.  Cough and shortness of breath or more accurately dyspnea on exertion chief complaints.  Cough relatively dry.  Dyspnea worse on inclines or stairs.  No time of day when things are better or worse.  No position make his better or worse.  No seasonal or environmental factors she can identify to make things better or worse.  No other alleviating or exacerbating factors.  Seems not really on controller inhaler therapy.  No significant improvement despite short course of steroids.  Reviewed most recent chest x-ray 05/2022 that reveals clear lungs on my review interpretation, radiology reports chronic bronchitic changes.  PFTs 2021 fully interpreted below but reviewed with patient today, consistent with asthma with bronchodilator response.  Questionaires / Pulmonary Flowsheets:   ACT:  Asthma Control Test ACT Total Score  08/20/2019 11:13 AM 16  11/22/2018 11:00 AM 15    MMRC: mMRC Dyspnea Scale mMRC Score  11/25/2019  1:31 PM 3  10/18/2019 10:02 AM 3    Epworth:      No data to display          Tests:   FENO:  No results found for: "NITRICOXIDE"  PFT:    Latest Ref Rng & Units 09/04/2019   11:42 AM 03/08/2017   10:24 AM 11/28/2016    8:54 AM  PFT Results  FVC-Pre L 1.52  1.56  1.96   FVC-Predicted Pre  % 66  65  82   FVC-Post L 1.72  1.68  1.98   FVC-Predicted Post % 74  70  83   Pre FEV1/FVC % % 80  75  77   Post FEV1/FCV % % 79  79  77   FEV1-Pre L 1.22  1.17  1.52   FEV1-Predicted Pre % 71  66  85   FEV1-Post L 1.37  1.32  1.54   DLCO uncorrected ml/min/mmHg 16.03   14.38   DLCO UNC% % 94   71   DLCO corrected ml/min/mmHg 16.23   15.30   DLCO COR %Predicted % 95   75   DLVA Predicted % 113   97   TLC L 4.36   4.24   TLC % Predicted % 94   92   RV % Predicted % 113   92   Personally reviewed and interpreted as spirometry suggestive of moderate striction versus air trapping and a positive bronchodilator response, lung volumes within normal limits, DLCO within normal limits  WALK:     11/25/2019   11:31 AM 10/18/2019   10:49 AM 10/06/2016   12:00 PM  SIX MIN WALK  Supplimental Oxygen during Test? (L/min) No No No  Tech Comments: Patient walked slow pace and was only able to complete 1 lap, maintained good sats  Patient was able to complete 1 lap at a leisurely pace. Patient has a history of unsteady gait. She was unable to complete the second lap due to her legs feeling weak. No O2 needed during or after her walk. Normal pace/SOB//LMR    Imaging: Personally reviewed DG Chest 2 View  Result Date: 06/02/2022 CLINICAL DATA:  Chronic cough, shortness of breath EXAM: CHEST - 2 VIEW COMPARISON:  06/14/2019 FINDINGS: Enlargement of cardiac silhouette. Mediastinal contours and pulmonary vascularity normal. Mild chronic peribronchial thickening. No acute infiltrate, pleural effusion, or pneumothorax. Bones demineralized. IMPRESSION: Enlargement of cardiac silhouette with mild chronic bronchitic changes. Electronically Signed   By: Ulyses Southward M.D.   On: 06/02/2022 16:40    Lab Results: Personally reviewed CBC    Component Value Date/Time   WBC 8.8 05/30/2022 1405   RBC 4.18 05/30/2022 1405   HGB 13.3 05/30/2022 1405   HCT 40.3 05/30/2022 1405   PLT 325.0 05/30/2022 1405   MCV 96.3  05/30/2022 1405   MCHC 33.1 05/30/2022 1405   RDW 15.8 (H) 05/30/2022 1405   LYMPHSABS 1.7 05/30/2022 1405   MONOABS 0.9 05/30/2022 1405   EOSABS 0.3 05/30/2022 1405   BASOSABS 0.1 05/30/2022 1405    BMET    Component Value Date/Time   NA 140 05/30/2022 1405   NA 145 (H) 06/11/2019 1141   K 4.4 05/30/2022 1405   CL 101 05/30/2022 1405   CO2 30 05/30/2022 1405   GLUCOSE 87 05/30/2022 1405   BUN 22 05/30/2022 1405   BUN 10 06/11/2019 1141   CREATININE 0.91 05/30/2022 1405   CALCIUM 9.3 05/30/2022 1405   GFRNONAA 70 06/11/2019 1141   GFRAA 81 06/11/2019 1141    BNP No results found for: "BNP"  ProBNP    Component Value Date/Time   PROBNP 636 06/11/2019 1141   PROBNP 310.0 (H) 10/06/2016 1218    Specialty Problems       Pulmonary Problems   Moderate persistent asthma    childhood now returning      Asthma    Allergy profile  10/06/16  >  Eos 0.2 /  IgE  4  RAST neg  Trial off propranolol 10/06/16 > no change  - 10/06/2016  After extensive coaching HFA effectiveness =    75%  - PFT's  11/28/2016  FEV1 1.54 (86 % ) ratio 77  p no % improvement from saba p nothing prior to study with DLCO  71/75  % corrects to 97  % for alv volume with mild exp curvature  - trial of gabapentin 100 tid 11/28/2016 > improved until uri > 01/12/2017 increase to 300 tid  - FENO 02/23/2017  =   Could not do  - Spirometry 02/23/2017  FEV1 1.32 (75%)  Ratio 72   - .flutter valve training 02/23/2017  - Sinus CT 02/24/2017 > Left ethmoid and left maxillary sinus mucosal thickening and fluid suggesting acute sinusitis >rx omnicef 300 mg twice daily x 10 days since amox reaction only a rash - MCT 03/08/17 POS reversible airflow obst > reproduced symptoms  - singuair trial 03/13/2017  - repeat sinus CT  03/14/2017 > 1. Improved left ethmoid sinusitis when compared to 02/24/2017. 2. Unchanged moderate mucosal thickening in the left maxillary Sinus.> rec   shoekaker eval > seen 03/21/17 rec flonase only           Dyspnea on exertion    Echo 06/01/16 Left ventricle: The cavity size was normal. There was mild focal  basal hypertrophy of the septum. Systolic function was normal.   The estimated ejection fraction was in the range of 55% to 60%.   Wall motion was normal; there were no regional wall motion   abnormalities. Doppler parameters are consistent with abnormal   left ventricular relaxation (grade 1 diastolic dysfunction). - Mitral valve: There was trivial regurgitation. - Right atrium: The atrium was mildly dilated.  10/06/2016  Walked RA x 3 laps @ 185 ft each stopped due to  End of study, nl pace, no  desat but sob at end  - trial off propranol and max rx for gerd 10/06/2016 >>> no better - PFT's 11/28/2016 wnl x for min non-specific exp curvature       Chronic nonallergic rhinitis   Cough   Other forms of dyspnea   Chronic bronchitis (HCC)    Allergies  Allergen Reactions   Amoxicillin Rash   Ampicillin Rash   Penicillins Rash   Statins Other (See Comments)    Per reports muscle aches and joint pain Per reports muscle aches and joint pain    Immunization History  Administered Date(s) Administered   Covid-19, Mrna,Vaccine(Spikevax)43yrs and older 05/31/2022   Fluad Quad(high Dose 65+) 10/08/2018, 10/25/2019, 11/02/2021   Influenza Whole 11/15/2017   Influenza, High Dose Seasonal PF 11/09/2015, 10/31/2016, 10/24/2020   Influenza-Unspecified 09/07/2012, 10/09/2014, 11/03/2016, 10/02/2019   PFIZER Comirnaty(Gray Top)Covid-19 Tri-Sucrose Vaccine 05/08/2020   PFIZER(Purple Top)SARS-COV-2 Vaccination 03/16/2019, 04/10/2019, 11/04/2019   PNEUMOCOCCAL CONJUGATE-20 05/25/2021   Pfizer Covid-19 Vaccine Bivalent Booster 35yrs & up 10/24/2020   Pneumococcal Conjugate-13 11/09/2015   Pneumococcal Polysaccharide-23 03/03/2017   Respiratory Syncytial Virus Vaccine,Recomb Aduvanted(Arexvy) 11/25/2021   Td 11/09/2015   Unspecified SARS-COV-2 Vaccination 11/09/2021   Zoster  Recombinat (Shingrix) 07/07/2017, 09/09/2017   Zoster, Live 02/08/2012    Past Medical History:  Diagnosis Date   Arthritis    Asthma    childhood now returning   Back pain affecting pregnancy 11/02/2014   Benign essential tremor 10/28/2014   Bilateral dry eyes    Breast cancer (HCC)    right   Cancer (HCC)    Breast   Dry eyes    Encounter for Medicare annual wellness exam 05/22/2016   Essential hypertension, benign 10/28/2014   H/O measles    H/O mumps    History of chicken pox    Lumbago 11/02/2014   Mixed hyperlipidemia 10/28/2014   Obesity 10/28/2014   Stenosis of cervical spine     Tobacco History: Social History   Tobacco Use  Smoking Status Never  Smokeless Tobacco Never   Counseling given: Not Answered   Continue to not smoke  Outpatient Encounter Medications as of 06/23/2022  Medication Sig   allopurinol (ZYLOPRIM) 100 MG tablet TAKE 2 TABLETS BY MOUTH EVERY DAY   AMBULATORY NON FORMULARY MEDICATION Rollator  Dx: Gait instability   amLODipine (NORVASC) 5 MG tablet TAKE 1 TABLET (5 MG TOTAL) BY MOUTH DAILY.   Ascorbic Acid (VITAMIN C PO) Take 1 tablet by mouth daily.    aspirin 81 MG tablet Take 81 mg by mouth daily.   benzonatate (TESSALON) 100 MG capsule Take 1 capsule (100 mg total) by mouth 2 (two) times daily as needed for cough.   Budeson-Glycopyrrol-Formoterol (BREZTRI AEROSPHERE) 160-9-4.8 MCG/ACT AERO Inhale 2 puffs into the lungs in the morning and at bedtime.   chlorpheniramine-HYDROcodone (TUSSIONEX) 10-8 MG/5ML Take 5 mLs by mouth every 12 (twelve) hours as needed for cough.   colchicine 0.6 MG tablet Take 2 tabs at  first and then 1 tab q 2 hours til pain relief or max of 6 tabs in 24 hours (Patient taking differently: Take 2 tabs at first and then 1 tab q 2 hours til pain relief or max of 6 tabs in 24 hours - PRN for gout flare-up)   DYMISTA 137-50 MCG/ACT SUSP 1-2 SPRAYS PER NOSTRIL TWICE DAILY FOR RUNNY NOSE   ezetimibe (ZETIA) 10 MG tablet TAKE  1 TABLET BY MOUTH EVERY DAY   famotidine (PEPCID) 40 MG tablet TAKE 1 TABLET BY MOUTH EVERYDAY AT BEDTIME   furosemide (LASIX) 40 MG tablet TAKE 1 TABLET BY MOUTH EVERY DAY   KLOR-CON M20 20 MEQ tablet TAKE 1 TABLET BY MOUTH DAILY. KEEP UPCOMING APPOINTMENT   levothyroxine (SYNTHROID) 112 MCG tablet TAKE 1 TABLET BY MOUTH EVERY DAY BEFORE BREAKFAST   loratadine (CLARITIN) 10 MG tablet TAKE 1 TABLET BY MOUTH EVERY DAY   Multiple Vitamins-Minerals (ICAPS AREDS 2) CAPS Take 1 capsule by mouth 2 (two) times daily.   predniSONE (DELTASONE) 10 MG tablet Take 4 tablets (40 mg total) by mouth daily with breakfast for 5 days, THEN 3 tablets (30 mg total) daily with breakfast for 5 days, THEN 2 tablets (20 mg total) daily with breakfast for 5 days, THEN 1 tablet (10 mg total) daily with breakfast for 5 days.   PREVACID 30 MG capsule TAKE 1 CAPSULE BY MOUTH TWICE A DAY --MAX LIMIT   propranolol (INDERAL) 60 MG tablet Take 1 tablet (60 mg total) by mouth 2 (two) times daily.   Red Yeast Rice Extract (RED YEAST RICE PO) Take by mouth daily.   Vitamin D, Ergocalciferol, (DRISDOL) 1.25 MG (50000 UNIT) CAPS capsule TAKE 1 CAPSULE (50,000 UNITS TOTAL) BY MOUTH EVERY 7 (SEVEN) DAYS   [DISCONTINUED] albuterol (PROVENTIL HFA;VENTOLIN HFA) 108 (90 Base) MCG/ACT inhaler INHALE 2 PUFFS INTO THE LUNGS EVERY 4 (FOUR) HOURS AS NEEDED FOR WHEEZING OR SHORTNESS OF BREATH.   [DISCONTINUED] fluticasone (FLOVENT HFA) 110 MCG/ACT inhaler 2 PUFFS TWICE DAILY TO PREVENT COUGHING OR WHEEZING. MAX INS WILL COVER IS A 30 DAY   [DISCONTINUED] predniSONE (DELTASONE) 10 MG tablet TAKE 3 TABLETS PO QD FOR 3 DAYS THEN TAKE 2 TABLETS PO QD FOR 3 DAYS THEN TAKE 1 TABLET PO QD FOR 3 DAYS THEN TAKE 1/2 TAB PO QD FOR 3 DAYS   albuterol (VENTOLIN HFA) 108 (90 Base) MCG/ACT inhaler Inhale 2 puffs into the lungs every 4 (four) hours as needed for wheezing or shortness of breath.   [DISCONTINUED] azithromycin (ZITHROMAX Z-PAK) 250 MG tablet As  directed   No facility-administered encounter medications on file as of 06/23/2022.     Review of Systems  Review of Systems  No chest pain with exertion.  No orthopnea or PND.  Comprehensive review of systems otherwise negative. Physical Exam  BP 118/78   Pulse (!) 57   Ht 5\' 1"  (1.549 m)   Wt 233 lb (105.7 kg)   SpO2 98% Comment: on RA  BMI 44.02 kg/m   Wt Readings from Last 5 Encounters:  06/23/22 233 lb (105.7 kg)  06/14/22 235 lb (106.6 kg)  06/08/22 235 lb (106.6 kg)  05/30/22 235 lb 6.4 oz (106.8 kg)  04/14/22 237 lb 9.6 oz (107.8 kg)    BMI Readings from Last 5 Encounters:  06/23/22 44.02 kg/m  06/14/22 44.40 kg/m  06/08/22 44.40 kg/m  05/30/22 44.48 kg/m  04/14/22 44.89 kg/m     Physical Exam General: Sitting in chair, no acute distress  Eyes: EOMI, no icterus Neck: Supple, no JVP Pulmonary: Clear, no work of breathing Cardiovascular warm no edema Abdomen: Nondistended, bowel sounds present MSK: No synovitis, no joint effusion Neuro: Normal gait, no weakness Psych: Normal mood, full affect   Assessment & Plan:   Chronic cough: Present for months.  Dry.  Some mild dyspnea on exertion.  Denies significant nasal symptoms, no reflux.  Not much improved with Depo-Medrol and short steroid taper.  Try high-dose steroid with taper.  Trial of Breztri inhaler, albuterol as needed.  Recurrent bronchitis: Does not seem she has received great relief with any treatment to date.  High suspicion for development of asthma especially with prior PFTs with positive bronchodilator response.  Treating as above.  Signs of chronicity on chest imaging, chronic bronchitic changes, otherwise clear.  High suspicion for asthma.  Eosinophils as high as 300 recently, consider Biologics if not improving in the future.   Return in about 2 months (around 08/23/2022).   Karren Burly, MD 06/23/2022   This appointment required 45 minutes of patient care (this includes  precharting, chart review, review of results, face-to-face care, etc.).

## 2022-06-23 NOTE — Patient Instructions (Signed)
Nice to meet you  I sent a new prednisone taper, higher doses for longer  Use the new inhaler Breztri 2 puffs twice a day every day.  Rinse your mouth out with water after every use.  Let me know if the prescription is too much I can look for an alternative.  I provided some samples.  Let see if this helps  Return to clinic in 2 months or sooner as needed with Dr. Judeth Horn

## 2022-06-28 ENCOUNTER — Other Ambulatory Visit: Payer: Self-pay

## 2022-06-28 ENCOUNTER — Encounter: Payer: Self-pay | Admitting: Physical Medicine and Rehabilitation

## 2022-06-28 ENCOUNTER — Ambulatory Visit (INDEPENDENT_AMBULATORY_CARE_PROVIDER_SITE_OTHER): Payer: Medicare Other | Admitting: Physical Medicine and Rehabilitation

## 2022-06-28 VITALS — BP 149/64 | HR 59

## 2022-06-28 DIAGNOSIS — M5416 Radiculopathy, lumbar region: Secondary | ICD-10-CM

## 2022-06-28 MED ORDER — METHYLPREDNISOLONE ACETATE 80 MG/ML IJ SUSP
80.0000 mg | Freq: Once | INTRAMUSCULAR | Status: AC
Start: 2022-06-28 — End: 2022-06-28
  Administered 2022-06-28: 80 mg

## 2022-06-28 NOTE — Progress Notes (Unsigned)
Functional Pain Scale - descriptive words and definitions  No Pain (0)   No Pain/Loss of function  Average Pain 0, BUT after walking its 7-8   +Driver, -BT, -Dye Allergies.

## 2022-06-28 NOTE — Patient Instructions (Signed)

## 2022-06-29 NOTE — Progress Notes (Signed)
Amanda Copeland - 82 y.o. female MRN [id number]  Date of birth: 06-13-1940  Office Visit Note: Visit Date: 06/28/2022 PCP: Bradd Canary, MD Referred by: Bradd Canary, MD  Subjective: Chief Complaint  Patient presents with   Lower Back - Pain   HPI:  Amanda Copeland is a 82 y.o. female who comes in today at the request of Ellin Goodie, FNP for planned Left L5-S1 Lumbar Interlaminar epidural steroid injection with fluoroscopic guidance.  The patient has failed conservative care including home exercise, medications, time and activity modification.  This injection will be diagnostic and hopefully therapeutic.  Please see requesting physician notes for further details and justification.   ROS Otherwise per HPI.  Assessment & Plan: Visit Diagnoses:    ICD-10-CM   1. Lumbar radiculopathy  M54.16 XR C-ARM NO REPORT    Epidural Steroid injection    methylPREDNISolone acetate (DEPO-MEDROL) injection 80 mg      Plan: No additional findings.   Meds & Orders:  Meds ordered this encounter  Medications   methylPREDNISolone acetate (DEPO-MEDROL) injection 80 mg    Orders Placed This Encounter  Procedures   XR C-ARM NO REPORT   Epidural Steroid injection    Follow-up: Return for visit to requesting provider as needed.   Procedures: No procedures performed  Lumbar Epidural Steroid Injection - Interlaminar Approach with Fluoroscopic Guidance  Patient: Amanda Copeland      Date of Birth: [birth year] MRN: [id number] PCP: Bradd Canary, MD      Visit Date: 06/28/2022   Universal Protocol:     Consent Given By: the patient  Position: PRONE  Additional Comments: Vital signs were monitored before and after the procedure. Patient was prepped and draped in the usual sterile fashion. The correct patient, procedure, and site was verified.   Injection Procedure Details:   Procedure diagnoses: Lumbar radiculopathy [M54.16]   Meds Administered:  Meds ordered this encounter   Medications   methylPREDNISolone acetate (DEPO-MEDROL) injection 80 mg     Laterality: Left  Location/Site:  L5-S1  Needle: 3.5 in., 20 ga. Tuohy  Needle Placement: Paramedian epidural  Findings:   -Comments: Excellent flow of contrast into the epidural space.  Procedure Details: Using a paramedian approach from the side mentioned above, the region overlying the inferior lamina was localized under fluoroscopic visualization and the soft tissues overlying this structure were infiltrated with 4 ml. of 1% Lidocaine without Epinephrine. The Tuohy needle was inserted into the epidural space using a paramedian approach.   The epidural space was localized using loss of resistance along with counter oblique bi-planar fluoroscopic views.  After negative aspirate for air, blood, and CSF, a 2 ml. volume of Isovue-250 was injected into the epidural space and the flow of contrast was observed. Radiographs were obtained for documentation purposes.    The injectate was administered into the level noted above.   Additional Comments:  The patient tolerated the procedure well Dressing: 2 x 2 sterile gauze and Band-Aid    Post-procedure details: Patient was observed during the procedure. Post-procedure instructions were reviewed.  Patient left the clinic in stable condition.   Clinical History: EXAM: MRI LUMBAR SPINE WITHOUT CONTRAST   TECHNIQUE: Multiplanar, multisequence MR imaging of the lumbar spine was performed. No intravenous contrast was administered.   COMPARISON:  MRI of the lumbar spine April 05, 2019.   FINDINGS: Segmentation:  Standard.   Alignment: Small anterolisthesis of L4 over L5 and L5 over S1, unchanged.   Vertebrae: No acute  fracture, evidence of discitis, or bone lesion. Chronic compression fracture of the T11 superior endplate with associated Schmorl node, unchanged. Congenitally small spinal canal.   Conus medullaris and cauda equina: Conus extends to  the T12-L1 level. Conus and cauda equina appear normal.   Paraspinal and other soft tissues: Negative.   Disc levels:   T12-L1: No spinal canal or neural foraminal stenosis.   L1-2: Shallow disc bulge and mild facet degenerative changes resulting in mild bilateral neural foraminal narrowing.   L2-3: Disc bulge with superimposed small central disc protrusion, moderate hypertrophic facet degenerative changes with bilateral joint effusion and ligamentum flavum redundancy resulting in mild spinal canal stenosis with narrowing of the bilateral subarticular zones, moderate right and moderate to severe left neural foraminal narrowing.   L3-4: Disc bulge, moderate to advanced hypertrophic facet degenerative changes and ligamentum flavum redundancy resulting in mild spinal canal stenosis with narrowing of the bilateral subarticular zones, mild right and moderate left neural foraminal narrowing. Left neural foraminal narrowing appear progressed since prior MRI.   L4-5: Anterolisthesis, disc bulge with new left foraminal disc protrusion, prominent hypertrophic facet degenerative changes and ligamentum flavum redundancy resulting in severe spinal canal stenosis, moderate right and moderate to severe left neural foraminal narrowing.   L5-S1: Prominent loss of disc height, shallow disc bulge and moderate hypertrophic facet degenerative changes without significant spinal canal or neural foraminal stenosis.   IMPRESSION: 1. Severe spinal canal stenosis at L4-5 with moderate right and moderate to severe left neural foraminal narrowing. 2. Mild spinal canal stenosis with narrowing of the bilateral subarticular zones at L3-4 with mild right and moderate left neural foraminal narrowing. 3. Mild spinal canal stenosis with narrowing of the bilateral subarticular zones at L2-3 with moderate right and moderate to severe left neural foraminal narrowing.     Electronically Signed   By: Baldemar Lenis M.D.   On: 05/10/2021 14:09     Objective:  VS:  HT:    WT:   BMI:     BP:(!) 149/64  HR:(!) 59bpm  TEMP: ( )  RESP:  Physical Exam Vitals and nursing note reviewed.  Constitutional:      General: She is not in acute distress.    Appearance: Normal appearance. She is obese. She is not ill-appearing.  HENT:     Head: Normocephalic and atraumatic.     Right Ear: External ear normal.     Left Ear: External ear normal.  Eyes:     Extraocular Movements: Extraocular movements intact.  Cardiovascular:     Rate and Rhythm: Normal rate.     Pulses: Normal pulses.  Pulmonary:     Effort: Pulmonary effort is normal. No respiratory distress.  Abdominal:     General: There is no distension.     Palpations: Abdomen is soft.  Musculoskeletal:        General: Tenderness present.     Cervical back: Neck supple.     Right lower leg: No edema.     Left lower leg: No edema.     Comments: Patient has good distal strength with no pain over the greater trochanters.  No clonus or focal weakness.  Skin:    Findings: No erythema, lesion or rash.  Neurological:     General: No focal deficit present.     Mental Status: She is alert and oriented to person, place, and time.     Sensory: No sensory deficit.     Motor: No weakness or  abnormal muscle tone.     Coordination: Coordination normal.  Psychiatric:        Mood and Affect: Mood normal.        Behavior: Behavior normal.      Imaging: XR C-ARM NO REPORT  Result Date: 06/28/2022 Please see Notes tab for imaging impression.

## 2022-06-29 NOTE — Procedures (Signed)
Lumbar Epidural Steroid Injection - Interlaminar Approach with Fluoroscopic Guidance  Patient: Amanda Copeland      Date of Birth: 04/15/1940 MRN: [id number] PCP: Bradd Canary, MD      Visit Date: 06/28/2022   Universal Protocol:     Consent Given By: the patient  Position: PRONE  Additional Comments: Vital signs were monitored before and after the procedure. Patient was prepped and draped in the usual sterile fashion. The correct patient, procedure, and site was verified.   Injection Procedure Details:   Procedure diagnoses: Lumbar radiculopathy [M54.16]   Meds Administered:  Meds ordered this encounter  Medications   methylPREDNISolone acetate (DEPO-MEDROL) injection 80 mg     Laterality: Left  Location/Site:  L5-S1  Needle: 3.5 in., 20 ga. Tuohy  Needle Placement: Paramedian epidural  Findings:   -Comments: Excellent flow of contrast into the epidural space.  Procedure Details: Using a paramedian approach from the side mentioned above, the region overlying the inferior lamina was localized under fluoroscopic visualization and the soft tissues overlying this structure were infiltrated with 4 ml. of 1% Lidocaine without Epinephrine. The Tuohy needle was inserted into the epidural space using a paramedian approach.   The epidural space was localized using loss of resistance along with counter oblique bi-planar fluoroscopic views.  After negative aspirate for air, blood, and CSF, a 2 ml. volume of Isovue-250 was injected into the epidural space and the flow of contrast was observed. Radiographs were obtained for documentation purposes.    The injectate was administered into the level noted above.   Additional Comments:  The patient tolerated the procedure well Dressing: 2 x 2 sterile gauze and Band-Aid    Post-procedure details: Patient was observed during the procedure. Post-procedure instructions were reviewed.  Patient left the clinic in stable condition.

## 2022-07-05 NOTE — Telephone Encounter (Signed)
Pt informed of below.  She states her secondary $50 deductible has been met now and her secondary should cover her remaining part. Pt to schedule in [facility] office.

## 2022-07-06 ENCOUNTER — Other Ambulatory Visit: Payer: Self-pay | Admitting: Family Medicine

## 2022-07-06 DIAGNOSIS — E782 Mixed hyperlipidemia: Secondary | ICD-10-CM

## 2022-07-15 ENCOUNTER — Telehealth: Payer: Self-pay | Admitting: Neurology

## 2022-07-15 NOTE — Telephone Encounter (Signed)
Called patient back to get more information Patient said that this has been going on for around a month. Patient feels like she is shaking from the inside out hand isn't shaking badly but fine motor skills are affected. Patient  aware Dr. Arbutus Leas is out of the office and that I am sending to her colleagues. Patient on Propanolol 60 mg BID she is unable to take a lot of med's like primidone because she has sensitivities

## 2022-07-15 NOTE — Telephone Encounter (Signed)
Called patient and let her know Dr. Rosanne Sack recommendations to wait for Dr. Arbutus Leas

## 2022-07-15 NOTE — Telephone Encounter (Signed)
Called pateint unable to reach left voicemail ?

## 2022-07-15 NOTE — Telephone Encounter (Signed)
Pt called in stating she is very shaky. Her legs feel weak. She feels like her legs will not hold her up. She even feels like the inside of her body is shaking.

## 2022-07-20 NOTE — Telephone Encounter (Signed)
Called pateint and left message  

## 2022-07-20 NOTE — Telephone Encounter (Signed)
Called patient and let her know Dr. Iona Beard recommendation

## 2022-07-20 NOTE — Telephone Encounter (Signed)
Patient asking if she could increase propranolol she is currently taking 60 mg BID

## 2022-07-25 ENCOUNTER — Other Ambulatory Visit: Payer: Self-pay | Admitting: Family Medicine

## 2022-08-09 ENCOUNTER — Ambulatory Visit (INDEPENDENT_AMBULATORY_CARE_PROVIDER_SITE_OTHER): Payer: Medicare Other | Admitting: Sports Medicine

## 2022-08-09 DIAGNOSIS — M810 Age-related osteoporosis without current pathological fracture: Secondary | ICD-10-CM

## 2022-08-09 MED ORDER — DENOSUMAB 60 MG/ML ~~LOC~~ SOSY
60.0000 mg | PREFILLED_SYRINGE | Freq: Once | SUBCUTANEOUS | Status: AC
  Administered 2022-08-09: 60 mg via SUBCUTANEOUS

## 2022-08-09 NOTE — Progress Notes (Unsigned)
Patient given [place] prolia injection 60mg /ml in the right lower abdomen. Patient tolerated injection well without reaction at the injection site. Patient will schedule next injection which is 6 months from today, in [name] 2025.

## 2022-08-09 NOTE — Patient Instructions (Addendum)
You will be due for your next Prolia injection on/after [name]. 3rd, 2025

## 2022-08-23 ENCOUNTER — Encounter: Payer: Self-pay | Admitting: Pulmonary Disease

## 2022-08-23 ENCOUNTER — Ambulatory Visit (INDEPENDENT_AMBULATORY_CARE_PROVIDER_SITE_OTHER): Payer: Medicare Other | Admitting: Pulmonary Disease

## 2022-08-23 VITALS — BP 120/62 | HR 59 | Ht 61.0 in | Wt 235.0 lb

## 2022-08-23 DIAGNOSIS — R053 Chronic cough: Secondary | ICD-10-CM

## 2022-08-23 MED ORDER — BREZTRI AEROSPHERE 160-9-4.8 MCG/ACT IN AERO: 2.0000 | INHALATION_SPRAY | Freq: Two times a day (BID) | RESPIRATORY_TRACT | 11 refills | Status: DC

## 2022-08-23 NOTE — Patient Instructions (Addendum)
Nice to see you again  I am glad the Markus Daft has helped  I sent enough refills for 1 year today  Try the lidocaine patches to the right flank or chest where you are having pain.  Since I can press on it hurts and is positional it is related to the muscle or bone issue.  If the pain is persisting in the next couple weeks and may be worth a chest x-ray or rib x-ray just to see what is going on  You can take ibuprofen 600 mg up to every 6 hours as needed or Tylenol extra strength 2 tablets (1000 mg) up to every 8 hours as needed  Return to clinic in 6 months or sooner as needed with Dr. Judeth Horn

## 2022-08-23 NOTE — Progress Notes (Signed)
@Patient  ID: Amanda Copeland, female    DOB: Aug 31, 1940, 82 y.o.   MRN: [id number]  Chief Complaint  Patient presents with   Follow-up    2 mo f/u. States her breathing has been stable since last visit.     Referring provider: Bradd Canary, MD  HPI:   82 y.o. woman whom we are seeing for chronic cough and shortness of breath.    Returns for scheduled follow-up.  At last visit concern for possible asthma.  Although not really responded to steroid shots in the past.  Prolonged steroid taper plus addition of Breztri at that time.  Cough now largely resolved.  Nearly gone.  She is pleased with this.  Has helped her shortness of breath as well.  Not totally gone but improved.  She has some right-sided chest pain/flank pain.  Positional in nature.  When she lies down.  Not every time.  But most notable at night when she lies down.  Improves with positional changes.  HPI at initial visit: Patient has worsening symptoms over the last several months.  Cough and shortness of breath or more accurately dyspnea on exertion chief complaints.  Cough relatively dry.  Dyspnea worse on inclines or stairs.  No time of day when things are better or worse.  No position make his better or worse.  No seasonal or environmental factors she can identify to make things better or worse.  No other alleviating or exacerbating factors.  Seems not really on controller inhaler therapy.  No significant improvement despite short course of steroids.  Reviewed most recent chest x-ray 05/2022 that reveals clear lungs on my review interpretation, radiology reports chronic bronchitic changes.  PFTs 2021 fully interpreted below but reviewed with patient today, consistent with asthma with bronchodilator response.  Questionaires / Pulmonary Flowsheets:   ACT:  Asthma Control Test ACT Total Score  08/20/2019 11:13 AM 16  11/22/2018 11:00 AM 15    MMRC: mMRC Dyspnea Scale mMRC Score  11/25/2019  1:31 PM 3  10/18/2019 10:02  AM 3    Epworth:      No data to display          Tests:   FENO:  No results found for: "NITRICOXIDE"  PFT:    Latest Ref Rng & Units 09/04/2019   11:42 AM 03/08/2017   10:24 AM 11/28/2016    8:54 AM  PFT Results  FVC-Pre L 1.52  1.56  1.96   FVC-Predicted Pre % 66  65  82   FVC-Post L 1.72  1.68  1.98   FVC-Predicted Post % 74  70  83   Pre FEV1/FVC % % 80  75  77   Post FEV1/FCV % % 79  79  77   FEV1-Pre L 1.22  1.17  1.52   FEV1-Predicted Pre % 71  66  85   FEV1-Post L 1.37  1.32  1.54   DLCO uncorrected ml/min/mmHg 16.03   14.38   DLCO UNC% % 94   71   DLCO corrected ml/min/mmHg 16.23   15.30   DLCO COR %Predicted % 95   75   DLVA Predicted % 113   97   TLC L 4.36   4.24   TLC % Predicted % 94   92   RV % Predicted % 113   92   Personally reviewed and interpreted as spirometry suggestive of moderate striction versus air trapping and a positive bronchodilator response, lung volumes within normal limits, DLCO  within normal limits  WALK:     11/25/2019   11:31 AM 10/18/2019   10:49 AM 10/06/2016   12:00 PM  SIX MIN WALK  Supplimental Oxygen during Test? (L/min) No No No  Tech Comments: Patient walked slow pace and was only able to complete 1 lap, maintained good sats Patient was able to complete 1 lap at a leisurely pace. Patient has a history of unsteady gait. She was unable to complete the second lap due to her legs feeling weak. No O2 needed during or after her walk. Normal pace/SOB//LMR    Imaging: Personally reviewed No results found.  Lab Results: Personally reviewed CBC    Component Value Date/Time   WBC 8.8 05/30/2022 1405   RBC 4.18 05/30/2022 1405   HGB 13.3 05/30/2022 1405   HCT 40.3 05/30/2022 1405   PLT 325.0 05/30/2022 1405   MCV 96.3 05/30/2022 1405   MCHC 33.1 05/30/2022 1405   RDW 15.8 (H) 05/30/2022 1405   LYMPHSABS 1.7 05/30/2022 1405   MONOABS 0.9 05/30/2022 1405   EOSABS 0.3 05/30/2022 1405   BASOSABS 0.1 05/30/2022 1405     BMET    Component Value Date/Time   NA 140 05/30/2022 1405   NA 145 (H) 06/11/2019 1141   K 4.4 05/30/2022 1405   CL 101 05/30/2022 1405   CO2 30 05/30/2022 1405   GLUCOSE 87 05/30/2022 1405   BUN 22 05/30/2022 1405   BUN 10 06/11/2019 1141   CREATININE 0.91 05/30/2022 1405   CALCIUM 9.3 05/30/2022 1405   GFRNONAA 70 06/11/2019 1141   GFRAA 81 06/11/2019 1141    BNP No results found for: "BNP"  ProBNP    Component Value Date/Time   PROBNP 636 06/11/2019 1141   PROBNP 310.0 (H) 10/06/2016 1218    Specialty Problems       Pulmonary Problems   Moderate persistent asthma    childhood now returning      Asthma    Allergy profile  10/06/16  >  Eos 0.2 /  IgE  4  RAST neg  Trial off propranolol 10/06/16 > no change  - 10/06/2016  After extensive coaching HFA effectiveness =    75%  - PFT's  11/28/2016  FEV1 1.54 (86 % ) ratio 77  p no % improvement from saba p nothing prior to study with DLCO  71/75  % corrects to 97  % for alv volume with mild exp curvature  - trial of gabapentin 100 tid 11/28/2016 > improved until uri > 01/12/2017 increase to 300 tid  - FENO 02/23/2017  =   Could not do  - Spirometry 02/23/2017  FEV1 1.32 (75%)  Ratio 72   - .flutter valve training 02/23/2017  - Sinus CT 02/24/2017 > Left ethmoid and left maxillary sinus mucosal thickening and fluid suggesting acute sinusitis >rx omnicef 300 mg twice daily x 10 days since amox reaction only a rash - MCT 03/08/17 POS reversible airflow obst > reproduced symptoms  - singuair trial 03/13/2017  - repeat sinus CT  03/14/2017 > 1. Improved left ethmoid sinusitis when compared to 02/24/2017. 2. Unchanged moderate mucosal thickening in the left maxillary Sinus.> rec   shoekaker eval > seen 03/21/17 rec flonase only          Dyspnea on exertion    Echo 06/01/16 Left ventricle: The cavity size was normal. There was mild focal   basal hypertrophy of the septum. Systolic function was normal.   The estimated  ejection fraction  was in the range of 55% to 60%.   Wall motion was normal; there were no regional wall motion   abnormalities. Doppler parameters are consistent with abnormal   left ventricular relaxation (grade 1 diastolic dysfunction). - Mitral valve: There was trivial regurgitation. - Right atrium: The atrium was mildly dilated.  10/06/2016  Walked RA x 3 laps @ 185 ft each stopped due to  End of study, nl pace, no  desat but sob at end  - trial off propranol and max rx for gerd 10/06/2016 >>> no better - PFT's 11/28/2016 wnl x for min non-specific exp curvature       Chronic nonallergic rhinitis   Cough   Other forms of dyspnea   Chronic bronchitis (HCC)    Allergies  Allergen Reactions   Amoxicillin Rash   Ampicillin Rash   Penicillins Rash   Statins Other (See Comments)    Per reports muscle aches and joint pain Per reports muscle aches and joint pain    Immunization History  Administered Date(s) Administered   Covid-19, Mrna,Vaccine(Spikevax)37yrs and older 05/31/2022   Fluad Quad(high Dose 65+) 10/08/2018, 10/25/2019, 11/02/2021   Influenza Whole 11/15/2017   Influenza, High Dose Seasonal PF 11/09/2015, 10/31/2016, 10/24/2020   Influenza-Unspecified 09/07/2012, 10/09/2014, 11/03/2016, 10/02/2019   PFIZER Comirnaty(Gray Top)Covid-19 Tri-Sucrose Vaccine 05/08/2020   PFIZER(Purple Top)SARS-COV-2 Vaccination 03/16/2019, 04/10/2019, 11/04/2019   PNEUMOCOCCAL CONJUGATE-20 05/25/2021   Pfizer Covid-19 Vaccine Bivalent Booster 12yrs & up 10/24/2020   Pneumococcal Conjugate-13 11/09/2015   Pneumococcal Polysaccharide-23 03/03/2017   Respiratory Syncytial Virus Vaccine,Recomb Aduvanted(Arexvy) 11/25/2021   Td 11/09/2015   Unspecified SARS-COV-2 Vaccination 11/09/2021   Zoster Recombinant(Shingrix) 07/07/2017, 09/09/2017   Zoster, Live 02/08/2012    Past Medical History:  Diagnosis Date   Arthritis    Asthma    childhood now returning   Back pain affecting pregnancy  11/02/2014   Benign essential tremor 10/28/2014   Bilateral dry eyes    Breast cancer (HCC)    right   Cancer (HCC)    Breast   Dry eyes    Encounter for Medicare annual wellness exam 05/22/2016   Essential hypertension, benign 10/28/2014   H/O measles    H/O mumps    History of chicken pox    Lumbago 11/02/2014   Mixed hyperlipidemia 10/28/2014   Obesity 10/28/2014   Stenosis of cervical spine     Tobacco History: Social History   Tobacco Use  Smoking Status Never  Smokeless Tobacco Never   Counseling given: Not Answered   Continue to not smoke  Outpatient Encounter Medications as of 08/23/2022  Medication Sig   albuterol (VENTOLIN HFA) 108 (90 Base) MCG/ACT inhaler Inhale 2 puffs into the lungs every 4 (four) hours as needed for wheezing or shortness of breath.   allopurinol (ZYLOPRIM) 100 MG tablet TAKE 2 TABLETS BY MOUTH EVERY DAY   AMBULATORY NON FORMULARY MEDICATION Rollator  Dx: Gait instability   Ascorbic Acid (VITAMIN C PO) Take 1 tablet by mouth daily.    aspirin 81 MG tablet Take 81 mg by mouth daily.   chlorpheniramine-HYDROcodone (TUSSIONEX) 10-8 MG/5ML Take 5 mLs by mouth every 12 (twelve) hours as needed for cough.   colchicine 0.6 MG tablet Take 2 tabs at first and then 1 tab q 2 hours til pain relief or max of 6 tabs in 24 hours (Patient taking differently: Take 2 tabs at first and then 1 tab q 2 hours til pain relief or max of 6 tabs in 24 hours - PRN  for gout flare-up)   DYMISTA 137-50 MCG/ACT SUSP 1-2 SPRAYS PER NOSTRIL TWICE DAILY FOR RUNNY NOSE   ezetimibe (ZETIA) 10 MG tablet TAKE 1 TABLET BY MOUTH EVERY DAY   famotidine (PEPCID) 40 MG tablet TAKE 1 TABLET BY MOUTH EVERYDAY AT BEDTIME   furosemide (LASIX) 40 MG tablet TAKE 1 TABLET BY MOUTH EVERY DAY   KLOR-CON M20 20 MEQ tablet TAKE 1 TABLET BY MOUTH DAILY. KEEP UPCOMING APPOINTMENT   levothyroxine (SYNTHROID) 112 MCG tablet TAKE 1 TABLET BY MOUTH EVERY DAY BEFORE BREAKFAST   Multiple Vitamins-Minerals  (ICAPS AREDS 2) CAPS Take 1 capsule by mouth 2 (two) times daily.   PREVACID 30 MG capsule TAKE 1 CAPSULE BY MOUTH TWICE A DAY --MAX LIMIT   propranolol (INDERAL) 60 MG tablet Take 1 tablet (60 mg total) by mouth 2 (two) times daily.   Red Yeast Rice Extract (RED YEAST RICE PO) Take by mouth daily.   [DISCONTINUED] Budeson-Glycopyrrol-Formoterol (BREZTRI AEROSPHERE) 160-9-4.8 MCG/ACT AERO Inhale 2 puffs into the lungs in the morning and at bedtime.   Budeson-Glycopyrrol-Formoterol (BREZTRI AEROSPHERE) 160-9-4.8 MCG/ACT AERO Inhale 2 puffs into the lungs in the morning and at bedtime.   [DISCONTINUED] amLODipine (NORVASC) 5 MG tablet TAKE 1 TABLET (5 MG TOTAL) BY MOUTH DAILY.   [DISCONTINUED] benzonatate (TESSALON) 100 MG capsule Take 1 capsule (100 mg total) by mouth 2 (two) times daily as needed for cough.   [DISCONTINUED] Budeson-Glycopyrrol-Formoterol (BREZTRI AEROSPHERE) 160-9-4.8 MCG/ACT AERO Inhale 2 puffs into the lungs in the morning and at bedtime.   [DISCONTINUED] loratadine (CLARITIN) 10 MG tablet TAKE 1 TABLET BY MOUTH EVERY DAY   [DISCONTINUED] Vitamin D, Ergocalciferol, (DRISDOL) 1.25 MG (50000 UNIT) CAPS capsule TAKE 1 CAPSULE (50,000 UNITS TOTAL) BY MOUTH EVERY 7 (SEVEN) DAYS   No facility-administered encounter medications on file as of 08/23/2022.     Review of Systems  Review of Systems  No chest pain with exertion.  No orthopnea or PND.  Comprehensive review of systems otherwise negative. Physical Exam  BP 120/62   Pulse (!) 59   Ht 5\' 1"  (1.549 m)   Wt 235 lb (106.6 kg)   SpO2 96% Comment: on RA  BMI 44.40 kg/m   Wt Readings from Last 5 Encounters:  08/23/22 235 lb (106.6 kg)  06/23/22 233 lb (105.7 kg)  06/14/22 235 lb (106.6 kg)  06/08/22 235 lb (106.6 kg)  05/30/22 235 lb 6.4 oz (106.8 kg)    BMI Readings from Last 5 Encounters:  08/23/22 44.40 kg/m  06/23/22 44.02 kg/m  06/14/22 44.40 kg/m  06/08/22 44.40 kg/m  05/30/22 44.48 kg/m      Physical Exam General: Sitting in chair, no acute distress Eyes: EOMI, no icterus Neck: Supple, no JVP Pulmonary: Clear, no work of breathing Cardiovascular warm no edema Abdomen: Nondistended, bowel sounds present MSK: No synovitis, no joint effusion, palpation of right flank just posterior to midclavicular line yield significant tenderness Neuro: Normal gait, no weakness Psych: Normal mood, full affect   Assessment & Plan:   Chronic cough: Present for months.  Dry.  Some mild dyspnea on exertion.  Denies significant nasal symptoms, no reflux.  Not much improved with Depo-Medrol and short steroid taper.  Cough now much improved after steroid taper and starting Breztri for maintenance inhaler.  Breztri refilled today.  Recurrent bronchitis: Does not seem she has received great relief with any treatment to date.  High suspicion for development of asthma especially with prior PFTs with positive bronchodilator response.  Treating  as above.  Signs of chronicity on chest imaging, chronic bronchitic changes, otherwise clear.  High suspicion for asthma.  Eosinophils as high as 300 recently, consider Biologics if not improving in the future.  Right chest/flank pain: On exam just posterior to midclavicular line tender to palpation.  Suspect MSK in nature.  Consider film of ribs in future if not improving.  Conservative management with ibuprofen/Tylenol today.  Lidocaine patches okay to try as well.   Return in about 6 months (around 02/23/2023).   Karren Burly, MD 08/23/2022

## 2022-08-30 NOTE — Telephone Encounter (Signed)
Last Prolia inj 08/09/22 Next Prolia inj due 02/10/23 

## 2022-09-21 DIAGNOSIS — J309 Allergic rhinitis, unspecified: Secondary | ICD-10-CM | POA: Diagnosis not present

## 2022-09-21 DIAGNOSIS — J4530 Mild persistent asthma, uncomplicated: Secondary | ICD-10-CM | POA: Diagnosis not present

## 2022-09-28 ENCOUNTER — Other Ambulatory Visit: Payer: Self-pay | Admitting: Family Medicine

## 2022-10-01 ENCOUNTER — Other Ambulatory Visit: Payer: Self-pay | Admitting: Family Medicine

## 2022-10-01 DIAGNOSIS — E782 Mixed hyperlipidemia: Secondary | ICD-10-CM

## 2022-10-08 ENCOUNTER — Other Ambulatory Visit: Payer: Self-pay | Admitting: Interventional Cardiology

## 2022-10-08 ENCOUNTER — Other Ambulatory Visit: Payer: Self-pay | Admitting: Family Medicine

## 2022-10-08 ENCOUNTER — Other Ambulatory Visit: Payer: Self-pay | Admitting: Neurology

## 2022-10-08 DIAGNOSIS — G250 Essential tremor: Secondary | ICD-10-CM

## 2022-10-11 DIAGNOSIS — Z961 Presence of intraocular lens: Secondary | ICD-10-CM | POA: Diagnosis not present

## 2022-10-11 DIAGNOSIS — H35453 Secondary pigmentary degeneration, bilateral: Secondary | ICD-10-CM | POA: Diagnosis not present

## 2022-10-11 DIAGNOSIS — H43813 Vitreous degeneration, bilateral: Secondary | ICD-10-CM | POA: Diagnosis not present

## 2022-10-11 DIAGNOSIS — H353132 Nonexudative age-related macular degeneration, bilateral, intermediate dry stage: Secondary | ICD-10-CM | POA: Diagnosis not present

## 2022-10-11 DIAGNOSIS — H35363 Drusen (degenerative) of macula, bilateral: Secondary | ICD-10-CM | POA: Diagnosis not present

## 2022-10-11 DIAGNOSIS — H35721 Serous detachment of retinal pigment epithelium, right eye: Secondary | ICD-10-CM | POA: Diagnosis not present

## 2022-11-02 ENCOUNTER — Other Ambulatory Visit: Payer: Self-pay | Admitting: Family Medicine

## 2022-11-02 DIAGNOSIS — L570 Actinic keratosis: Secondary | ICD-10-CM | POA: Diagnosis not present

## 2022-11-02 DIAGNOSIS — L659 Nonscarring hair loss, unspecified: Secondary | ICD-10-CM | POA: Diagnosis not present

## 2022-11-02 DIAGNOSIS — L650 Telogen effluvium: Secondary | ICD-10-CM | POA: Diagnosis not present

## 2022-11-02 DIAGNOSIS — L218 Other seborrheic dermatitis: Secondary | ICD-10-CM | POA: Diagnosis not present

## 2022-11-15 ENCOUNTER — Telehealth: Payer: Self-pay | Admitting: Family Medicine

## 2022-11-15 NOTE — Telephone Encounter (Signed)
Called pt she is seen Dr.Wendling .

## 2022-11-15 NOTE — Telephone Encounter (Signed)
Pt called to advise her hair is falling out and she met with a dermatologist and they think they know what is causing it. No appts available with Dr. Abner Greenspan and pt refuses to see anyone else besides Dr. Abner Greenspan. Please call to advise if she can be worked in.

## 2022-11-16 ENCOUNTER — Encounter: Payer: Self-pay | Admitting: Family Medicine

## 2022-11-16 ENCOUNTER — Ambulatory Visit (INDEPENDENT_AMBULATORY_CARE_PROVIDER_SITE_OTHER): Payer: Medicare Other | Admitting: Family Medicine

## 2022-11-16 VITALS — BP 132/80 | HR 55 | Temp 98.0°F | Ht 61.0 in | Wt 238.1 lb

## 2022-11-16 DIAGNOSIS — M542 Cervicalgia: Secondary | ICD-10-CM | POA: Diagnosis not present

## 2022-11-16 DIAGNOSIS — S46311A Strain of muscle, fascia and tendon of triceps, right arm, initial encounter: Secondary | ICD-10-CM | POA: Diagnosis not present

## 2022-11-16 DIAGNOSIS — L650 Telogen effluvium: Secondary | ICD-10-CM | POA: Diagnosis not present

## 2022-11-16 NOTE — Patient Instructions (Signed)
Heat (pad or rice pillow in microwave) over affected area, 10-15 minutes twice daily.   Ice/cold pack over area for 10-15 min twice daily.  OK to take Tylenol 1000 mg (2 extra strength tabs) or 975 mg (3 regular strength tabs) every 6 hours as needed.  Let us know if you need anything.  Triceps Tendon Rupture (Distal) Rehab Ask your health care provider which exercises are safe for you. Do exercises exactly as told by your health care provider and adjust them as directed. It is normal to feel mild stretching, pulling, tightness, or discomfort as you do these exercises, but you should stop right away if you feel sudden pain or your pain gets worse. Do not begin these exercises until told by your health care provider. Stretching and range of motion exercises These exercises warm up your muscles and joints and improve the movement and flexibility of your arm. These exercises can also help to relieve pain, numbness, and tingling. Exercise A: Elbow flexion, passive  Stand or sit with your left / right arm at your side. Use your other hand to gently push your left / right hand toward your shoulder. Bend your elbow as far as your health care provider tells you to. You may be instructed to try to bend your elbow more and more over time. Hold for 30 seconds. Slowly return to the starting position. Repeat 2 times. Complete this exercise 3 times a week. Exercise B: Supination, passive  Sit with your left / right elbow bent to an "L" shape (90 degrees) and your forearm resting on a table, palm-down. Keeping your upper body and shoulder still, use your other hand to rotate your left / right palm up. Stop when you feel a gentle to moderate stretch. Hold for 30 seconds. Slowly return to the starting position. Repeat 2 times. Complete this exercise 3 times a week. Exercise C: Pronation, passive Sit with your left / right elbow bent to an "L" shape (90 degrees) and your forearm resting on a table,  palm-up. Keeping your upper body and shoulder still, use your other hand to rotate your left / right palm down. Stop when you feel a gentle to moderate stretch. Hold for 30 seconds. Slowly return to the starting position. Repeat 2 times. Complete this exercise 3 times a week. Exercise D: Elbow extension, passive, supine Lie on your back in a comfortable position that allows you to relax your arm muscles. Place a folded towel under your left / right upper arm so your elbow and shoulder are at the same height. Hold your left / right arm out straight with your other hand supporting it. Use your other arm to raise your left / right arm until your elbow does not rest on the bed or towel. Let the weight of your hand stretch the inside of your left / right elbow. Keep your arm and chest muscles relaxed. If directed, you may hold a 5 lb weight in your hand to increase the intensity of the stretch. Hold for 30 seconds. Slowly release the stretch. Repeat 2 times. Complete this exercise 3 times a week. Exercise E: Elbow flexion, active Start this exercise only when your healthcare provider tells you. Stand or sit with your left / right elbow bent and your palm facing in, toward your body. Bend your elbow as far as you can using only your arm muscles. Hold for 30 seconds. Slowly return to the starting position. Repeat 2 times. Complete this exercise 3 times a  week. Exercise F: Supination, active Start this exercise only when your healthcare provider tells you. Stand or sit with your left / right elbow bent to an "L" shape (90 degrees). Rotate your palm up until you feel a gentle stretch on the inside of your forearm. Hold for 30 seconds. Slowly return to the starting position. Repeat 2 times. Complete this exercise 3 times a week. Exercise G: Pronation, active  Start this exercise only when your healthcare provider tells you. Stand or sit with your left / right elbow bent to an "L" shape (90  degrees). Rotate your palm down until you feel a gentle stretch on the top of your forearm. Hold for 30 seconds. Slowly release and return to the starting position. Repeat 2 times. Complete this exercise 3 times a week. Exercise H: Elbow extension, active Start this exercise only when your healthcare provider tells you. Stand or sit with your left / right elbow bent and your palm facing in, toward your body. Slowly straighten your elbow using only your arm muscles. Stop when you feel a very slight stretch at the front of your arm, or when you reach the position where your health care provider tells you to stop. You may be instructed to try to straighten your arm more and more each week. Hold for 30 seconds. Slowly return to the starting position. Repeat 2 times. Complete this exercise 3 times a week. Strengthening exercises These exercises build strength and endurance in your arm and shoulder. Endurance is the ability to use your muscles for a long time, even after your muscles get tired. Do not start any strengthening exercises until told by your health care provider. Exercise I: Elbow flexion, isometric  Stand or sit with your left / right arm at waist height. Your palm should face in, toward your body. Place your other hand on top of your left / right forearm. Gently push down while you resist with your left / right arm. Use about half (50%) effort with both arms. You may be instructed to use more and more effort with your arms each week. Try not to let your right / left elbow move. Hold for 3 seconds. Let your muscles relax completely before you repeat this exercise. Repeat 2 times. Complete this exercise 3 times a week. Exercise J: Elbow extension, isometric  Stand or sit with your left / right arm at waist height. Your palm should face in, toward your body. Place your other hand on the bottom of your left / right forearm. Gently push up while you resist with your left / right  arm. Use about half (50%) effort with both arms. You may be instructed to use more and more effort with your arms each week. Try not to let your left / right elbow move. Hold for 3 seconds. Let your muscles relax completely before you repeat this exercise. Repeat 2 times. Complete this exercise 3 times a week. Exercise K: Biceps curls (elbow flexion, supinated) Sit on a stable chair without armrests, or stand. Hold a 5 lbweight in your left / right hand. Your palm should face out, away from your body, at the starting position. Bend your left / right elbow and move your hand up toward your shoulder. Slowly return to the starting position. Repeat 2 times. Complete this exercise 3 times a week. Exercise L: Hammer curls (elbow flexion, neutral forearm) Sit on a stable chair without armrests, or stand. Hold a 5 weight in your left / right hand, or  hold an exercise band with both hands. Your palms should face each other at the starting position. Bend your left / right elbow and move your hand up toward your shoulder. Keep your other arm straight. Slowly return to the starting position. Repeat 2 times. Complete this exercise 3 times a week. Exercise M: Triceps curls ( elbow extension) Lie on your back. Hold a 5 lb weight in your left / right hand. Bend your left / right elbow to an "L" shape (90 degrees) so the weight is in front of your face, over your chest, and your elbow is pointed up to the ceiling. Straighten your elbow, raising your hand toward the ceiling. Use your other hand to support your left / right upper arm. Slowly return to the starting position. Repeat 2 times. Complete this exercise 3 times a week. Exercise N: Elbow extension with exercise band  Sit on a stable chair without armrests, or stand. Hold an exercise band in both hands. Keeping your upper arms at your side, bring both hands up to your shoulder. Keep your left / right hand just below your other hand. Straighten  your left / right elbow while keeping your other arm still. Hold for 3 seconds. Slowly bend your elbow to return to the starting position. Repeat 2 times. Complete this exercise 3 times a week. Exercise O: Supination  Sit with your left / right forearm supported on a table. Your elbow should be at waist height. Rest your hand over the edge of the table, palm-down. Gently grasp a lightweight hammer. Without moving your left / right elbow, slowly rotate your palm up, to a "thumbs-up" position. Hold for3 seconds. Slowly return to the starting position. Repeat 2 times. Complete this exercise 3 times a week. Exercise P: Pronation  Sit with your left / right forearm supported on a table. Your elbow should be at waist height. Rest your hand over the edge of the table, palm-up. Gently grasp a lightweight hammer. Without moving your left / right elbow, slowly rotate your palm down. Hold for 3 seconds. Slowly return to the starting position. Repeat 2 times. Complete this exercise 3 times per week. This information is not intended to replace advice given to you by your health care provider. Make sure you discuss any questions you have with your health care provider. Document Released: 01/24/2005 Document Revised: 10/01/2015 Document Reviewed: 10/19/2014 [employer] Interactive Patient Education  2018 [employer]  EXERCISES RANGE OF MOTION (ROM) AND STRETCHING EXERCISES  These exercises may help you when beginning to rehabilitate your issue. In order to successfully resolve your symptoms, you must improve your posture. These exercises are designed to help reduce the forward-head and rounded-shoulder posture which contributes to this condition. Your symptoms may resolve with or without further involvement from your physician, physical therapist or athletic trainer. While completing these exercises, remember:  Restoring tissue flexibility helps normal motion to return to the joints. This allows  healthier, less painful movement and activity. An effective stretch should be held for at least 20 seconds, although you may need to begin with shorter hold times for comfort. A stretch should never be painful. You should only feel a gentle lengthening or release in the stretched tissue. Do not do any stretch or exercise that you cannot tolerate.  STRETCH- Axial Extensors Lie on your back on the floor. You may bend your knees for comfort. Place a rolled-up hand towel or dish towel, about 2 inches in diameter, under the part of your head  that makes contact with the floor. Gently tuck your chin, as if trying to make a "double chin," until you feel a gentle stretch at the base of your head. Hold 15-20 seconds. Repeat 2-3 times. Complete this exercise 1 time per day.   STRETCH - Axial Extension  Stand or sit on a firm surface. Assume a good posture: chest up, shoulders drawn back, abdominal muscles slightly tense, knees unlocked (if standing) and feet hip width apart. Slowly retract your chin so your head slides back and your chin slightly lowers. Continue to look straight ahead. You should feel a gentle stretch in the back of your head. Be certain not to feel an aggressive stretch since this can cause headaches later. Hold for 15-20 seconds. Repeat 2-3 times. Complete this exercise 1 time per day.  STRETCH - Cervical Side Bend  Stand or sit on a firm surface. Assume a good posture: chest up, shoulders drawn back, abdominal muscles slightly tense, knees unlocked (if standing) and feet hip width apart. Without letting your nose or shoulders move, slowly tip your right / left ear to your shoulder until your feel a gentle stretch in the muscles on the opposite side of your neck. Hold 15-20 seconds. Repeat 2-3 times. Complete this exercise 1-2 times per day.  STRETCH - Cervical Rotators  Stand or sit on a firm surface. Assume a good posture: chest up, shoulders drawn back, abdominal muscles slightly  tense, knees unlocked (if standing) and feet hip width apart. Keeping your eyes level with the ground, slowly turn your head until you feel a gentle stretch along the back and opposite side of your neck. Hold 15-20 seconds. Repeat 2-3 times. Complete this exercise 1-2 times per day.  RANGE OF MOTION - Neck Circles  Stand or sit on a firm surface. Assume a good posture: chest up, shoulders drawn back, abdominal muscles slightly tense, knees unlocked (if standing) and feet hip width apart. Gently roll your head down and around from the back of one shoulder to the back of the other. The motion should never be forced or painful. Repeat the motion 10-20 times, or until you feel the neck muscles relax and loosen. Repeat 2-3 times. Complete the exercise 1-2 times per day. STRENGTHENING EXERCISES - Cervical Strain and Sprain These exercises may help you when beginning to rehabilitate your injury. They may resolve your symptoms with or without further involvement from your physician, physical therapist, or athletic trainer. While completing these exercises, remember:  Muscles can gain both the endurance and the strength needed for everyday activities through controlled exercises. Complete these exercises as instructed by your physician, physical therapist, or athletic trainer. Progress the resistance and repetitions only as guided. You may experience muscle soreness or fatigue, but the pain or discomfort you are trying to eliminate should never worsen during these exercises. If this pain does worsen, stop and make certain you are following the directions exactly. If the pain is still present after adjustments, discontinue the exercise until you can discuss the trouble with your clinician.  STRENGTH - Cervical Flexors, Isometric Face a wall, standing about 6 inches away. Place a small pillow, a ball about 6-8 inches in diameter, or a folded towel between your forehead and the wall. Slightly tuck your chin  and gently push your forehead into the soft object. Push only with mild to moderate intensity, building up tension gradually. Keep your jaw and forehead relaxed. Hold 10 to 20 seconds. Keep your breathing relaxed. Release the tension slowly.  Relax your neck muscles completely before you start the next repetition. Repeat 2-3 times. Complete this exercise 1 time per day.  STRENGTH- Cervical Lateral Flexors, Isometric  Stand about 6 inches away from a wall. Place a small pillow, a ball about 6-8 inches in diameter, or a folded towel between the side of your head and the wall. Slightly tuck your chin and gently tilt your head into the soft object. Push only with mild to moderate intensity, building up tension gradually. Keep your jaw and forehead relaxed. Hold 10 to 20 seconds. Keep your breathing relaxed. Release the tension slowly. Relax your neck muscles completely before you start the next repetition. Repeat 2-3 times. Complete this exercise 1 time per day.  STRENGTH - Cervical Extensors, Isometric  Stand about 6 inches away from a wall. Place a small pillow, a ball about 6-8 inches in diameter, or a folded towel between the back of your head and the wall. Slightly tuck your chin and gently tilt your head back into the soft object. Push only with mild to moderate intensity, building up tension gradually. Keep your jaw and forehead relaxed. Hold 10 to 20 seconds. Keep your breathing relaxed. Release the tension slowly. Relax your neck muscles completely before you start the next repetition. Repeat 2-3 times. Complete this exercise 1 time per day.  POSTURE AND BODY MECHANICS CONSIDERATIONS Keeping correct posture when sitting, standing or completing your activities will reduce the stress put on different body tissues, allowing injured tissues a chance to heal and limiting painful experiences. The following are general guidelines for improved posture. Your physician or physical therapist will  provide you with any instructions specific to your needs. While reading these guidelines, remember: The exercises prescribed by your provider will help you have the flexibility and strength to maintain correct postures. The correct posture provides the optimal environment for your joints to work. All of your joints have less wear and tear when properly supported by a spine with good posture. This means you will experience a healthier, less painful body. Correct posture must be practiced with all of your activities, especially prolonged sitting and standing. Correct posture is as important when doing repetitive low-stress activities (typing) as it is when doing a single heavy-load activity (lifting).  PROLONGED STANDING WHILE SLIGHTLY LEANING FORWARD When completing a task that requires you to lean forward while standing in one place for a long time, place either foot up on a stationary 2- to 4-inch high object to help maintain the best posture. When both feet are on the ground, the low back tends to lose its slight inward curve. If this curve flattens (or becomes too large), then the back and your other joints will experience too much stress, fatigue more quickly, and can cause pain.   RESTING POSITIONS Consider which positions are most painful for you when choosing a resting position. If you have pain with flexion-based activities (sitting, bending, stooping, squatting), choose a position that allows you to rest in a less flexed posture. You would want to avoid curling into a fetal position on your side. If your pain worsens with extension-based activities (prolonged standing, working overhead), avoid resting in an extended position such as sleeping on your stomach. Most people will find more comfort when they rest with their spine in a more neutral position, neither too rounded nor too arched. Lying on a non-sagging bed on your side with a pillow between your knees, or on your back with a pillow under  your knees  will often provide some relief. Keep in mind, being in any one position for a prolonged period of time, no matter how correct your posture, can still lead to stiffness.  WALKING Walk with an upright posture. Your ears, shoulders, and hips should all line up. OFFICE WORK When working at a desk, create an environment that supports good, upright posture. Without extra support, muscles fatigue and lead to excessive strain on joints and other tissues.  CHAIR: A chair should be able to slide under your desk when your back makes contact with the back of the chair. This allows you to work closely. The chair's height should allow your eyes to be level with the upper part of your monitor and your hands to be slightly lower than your elbows. Body position: Your feet should make contact with the floor. If this is not possible, use a foot rest. Keep your ears over your shoulders. This will reduce stress on your neck and low back.

## 2022-11-16 NOTE — Progress Notes (Signed)
Musculoskeletal Exam  Patient: Amanda Copeland DOB: 10/24/1940  DOS: 11/16/2022  SUBJECTIVE:  Chief Complaint:   Chief Complaint  Patient presents with   Arm Pain    Right   Neck Pain    Amanda Copeland is a 82 y.o.  female for evaluation and treatment of R arm and neck pain.   Onset:  several months ago for the arm, a couple years for the neck. Fell in [employer] parking lot Location: base of skull and R anterior arm Character:  aching  Progression of issue:  is unchanged Associated symptoms: feels like it will fall off No bruising, redness, swelling, loss of ROM; pain thru the end of ROM Treatment: to date has been OTC NSAIDS, lidocaine patch.   Neurovascular symptoms: no  Past Medical History:  Diagnosis Date   Arthritis    Asthma    childhood now returning   Back pain affecting pregnancy 11/02/2014   Benign essential tremor 10/28/2014   Bilateral dry eyes    Breast cancer (HCC)    right   Cancer (HCC)    Breast   Dry eyes    Encounter for Medicare annual wellness exam 05/22/2016   Essential hypertension, benign 10/28/2014   H/O measles    H/O mumps    History of chicken pox    Lumbago 11/02/2014   Mixed hyperlipidemia 10/28/2014   Obesity 10/28/2014   Stenosis of cervical spine     Objective: VITAL SIGNS: BP 132/80 (BP Location: Left Arm, Patient Position: Sitting, Cuff Size: Large)   Pulse (!) 55   Temp 98 F (36.7 C) (Oral)   Ht 5\' 1"  (1.549 m)   Wt 238 lb 2 oz (108 kg)   SpO2 96%   BMI 44.99 kg/m  Constitutional: Well formed, well developed. No acute distress. Thorax & Lungs: No accessory muscle use Musculoskeletal: R arm.   Normal active range of motion: yes.   Normal passive range of motion: yes Tenderness to palpation: yes- over lateral head of triceps and distal triceps tendon Deformity: no Ecchymosis: no Also ttp over lateral neck muscles and subocc triangle and cerv parasp msc Neurologic: Normal sensory function. Gait slow/cautious.  Psychiatric:  Normal mood. Age appropriate judgment and insight. Alert & oriented x 3.    Assessment:  Neck pain  Strain of right triceps tendon, initial encounter  Plan: Stretches/exercises, heat, ice, Tylenol. If no better, will set up w PT.  F/u prn. The patient voiced understanding and agreement to the plan.   Amanda Roche [place], DO 11/16/22  11:55 AM

## 2022-11-29 ENCOUNTER — Ambulatory Visit (INDEPENDENT_AMBULATORY_CARE_PROVIDER_SITE_OTHER): Payer: Medicare Other | Admitting: Family Medicine

## 2022-11-29 ENCOUNTER — Encounter: Payer: Self-pay | Admitting: Family Medicine

## 2022-11-29 VITALS — BP 144/62 | HR 53 | Temp 97.3°F | Resp 20 | Ht 61.0 in | Wt 230.2 lb

## 2022-11-29 DIAGNOSIS — E039 Hypothyroidism, unspecified: Secondary | ICD-10-CM | POA: Diagnosis not present

## 2022-11-29 DIAGNOSIS — Z780 Asymptomatic menopausal state: Secondary | ICD-10-CM | POA: Diagnosis not present

## 2022-11-29 DIAGNOSIS — E785 Hyperlipidemia, unspecified: Secondary | ICD-10-CM

## 2022-11-29 DIAGNOSIS — R739 Hyperglycemia, unspecified: Secondary | ICD-10-CM

## 2022-11-29 DIAGNOSIS — E2839 Other primary ovarian failure: Secondary | ICD-10-CM | POA: Diagnosis not present

## 2022-11-29 DIAGNOSIS — M542 Cervicalgia: Secondary | ICD-10-CM | POA: Diagnosis not present

## 2022-11-29 DIAGNOSIS — I10 Essential (primary) hypertension: Secondary | ICD-10-CM

## 2022-11-29 DIAGNOSIS — E559 Vitamin D deficiency, unspecified: Secondary | ICD-10-CM

## 2022-11-29 DIAGNOSIS — M8580 Other specified disorders of bone density and structure, unspecified site: Secondary | ICD-10-CM | POA: Diagnosis not present

## 2022-11-29 DIAGNOSIS — Z0000 Encounter for general adult medical examination without abnormal findings: Secondary | ICD-10-CM

## 2022-11-29 LAB — COMPREHENSIVE METABOLIC PANEL
ALT: 9 U/L (ref 0–35)
AST: 16 U/L (ref 0–37)
Albumin: 4.1 g/dL (ref 3.5–5.2)
Alkaline Phosphatase: 72 U/L (ref 39–117)
BUN: 22 mg/dL (ref 6–23)
CO2: 28 meq/L (ref 19–32)
Calcium: 9.4 mg/dL (ref 8.4–10.5)
Chloride: 103 meq/L (ref 96–112)
Creatinine, Ser: 0.9 mg/dL (ref 0.40–1.20)
GFR: 59.35 mL/min — ABNORMAL LOW (ref >60.00)
Glucose, Bld: 99 mg/dL (ref 70–99)
Potassium: 4.2 meq/L (ref 3.5–5.1)
Sodium: 143 meq/L (ref 135–145)
Total Bilirubin: 0.4 mg/dL (ref 0.2–1.2)
Total Protein: 6.8 g/dL (ref 6.0–8.3)

## 2022-11-29 LAB — LIPID PANEL
Cholesterol: 189 mg/dL (ref 0–200)
HDL: 53.2 mg/dL (ref >39.00)
LDL Cholesterol: 106 mg/dL — ABNORMAL HIGH (ref 0–99)
NonHDL: 136.28
Total CHOL/HDL Ratio: 4
Triglycerides: 151 mg/dL — ABNORMAL HIGH (ref 0.0–149.0)
VLDL: 30.2 mg/dL (ref 0.0–40.0)

## 2022-11-29 LAB — TSH: TSH: 2.06 u[IU]/mL (ref 0.35–5.50)

## 2022-11-29 LAB — HEMOGLOBIN A1C: Hgb A1c MFr Bld: 5.5 % (ref 4.6–6.5)

## 2022-11-29 MED ORDER — TIZANIDINE HCL 2 MG PO TABS: 1.0000 mg | ORAL_TABLET | Freq: Four times a day (QID) | ORAL | 0 refills | Status: DC | PRN

## 2022-11-29 NOTE — Assessment & Plan Note (Addendum)
Patient encouraged to maintain heart healthy diet, regular exercise, adequate sleep. Consider daily probiotics. Take medications as prescribed. Labs ordered and reviewed. Given and reviewed copy of ACP documents from [place] Secretary of State and encouraged to complete and return 

## 2022-11-29 NOTE — Assessment & Plan Note (Signed)
With radicular pain down right arm

## 2022-11-29 NOTE — Progress Notes (Signed)
Subjective:    Patient ID: Amanda Copeland, female    DOB: [birth year], 82 y.o.   MRN: [id number]  Chief Complaint  Patient presents with  . Follow-up    HPI Discussed the use of AI scribe software for clinical note transcription with the patient, who gave verbal consent to proceed.  History of Present Illness   The patient, with a history of osteopenia, presents with concerns about hair loss, chronic neck and arm pain, and sleep disturbances. The hair loss, which started about a month ago, was diagnosed as telogenic effluvium by a dermatologist. The patient reports that the hair loss is expected to stop and regrow eventually.  The patient's chronic neck and arm pain has been a significant issue for over a year. The neck pain, which radiates to the top of the head, has been somewhat relieved by the use of Voltaren. However, the arm pain remains constant and is particularly severe at night. The patient reports that the pain is worse when lying down and moving the arm. The patient has not seen an orthopedic specialist for these issues yet.  The patient also reports excessive daytime sleepiness and occasional nightmares. The patient has not been tested for sleep apnea due to concerns about having to use a CPAP machine. The patient also mentions a recent incident of waking up from a nightmare, screaming and kicking, which was a one-time occurrence. no recent febrile illness or hospitalization. no c/o polyuria or polydipsia.        Past Medical History:  Diagnosis Date  . Arthritis   . Asthma    childhood now returning  . Back pain affecting pregnancy 11/02/2014  . Benign essential tremor 10/28/2014  . Bilateral dry eyes   . Breast cancer (HCC)    right  . Cancer (HCC)    Breast  . Dry eyes   . Encounter for Medicare annual wellness exam 05/22/2016  . Essential hypertension, benign 10/28/2014  . H/O measles   . H/O mumps   . History of chicken pox   . Lumbago 11/02/2014  . Mixed  hyperlipidemia 10/28/2014  . Obesity 10/28/2014  . Stenosis of cervical spine     Past Surgical History:  Procedure Laterality Date  . BACK SURGERY    . BREAST REDUCTION SURGERY Left   . CHOLECYSTECTOMY    . EYE SURGERY  2008   b/l cataracts removed, in [place]  . MASTECTOMY Right   . REDUCTION MAMMAPLASTY    . REPLACEMENT TOTAL KNEE BILATERAL Bilateral   . ROTATOR CUFF REPAIR Left     Family History  Problem Relation Age of Onset  . Heart disease Mother   . Heart attack Mother   . Heart failure Mother   . Hypertension Mother   . Cancer Father        colon  . Asthma Father   . Parkinson's disease Father   . Cancer Maternal Grandmother        uterine  . Heart disease Maternal Grandfather   . Obesity Daughter   . Appendicitis Paternal Grandmother   . Stroke Neg Hx     Social History   Socioeconomic History  . Marital status: Married    Spouse name: Not on file  . Number of children: 1  . Years of education: Not on file  . Highest education level: Master's degree (e.g., MA, MS, MEng, MEd, MSW, MBA)  Occupational History  . 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 Smoking status: Never  . Smokeless tobacco: Never  Vaping Use  . Vaping status: Never Used  Substance and Sexual Activity  . Alcohol use: Yes    Alcohol/week: 0.0 standard drinks of alcohol    Comment: two times a month  . Drug use: No  . Sexual activity: Not on file    Comment: lives with husband, moved [place], no dietary restrictions, retired Engineer, site  Other Topics Concern  . Not on file  Social History Narrative   Pt lives with Spouse at home 1 daughter   Drinks coffee, and soda   Right handed   Social Determinants of Health   Financial Resource Strain: Not on file  Food Insecurity: Not on file  Transportation Needs: Not on file  Physical Activity: Not on file  Stress: Not on file  Social Connections: Not on file  Intimate Partner Violence: Not on file     Outpatient Medications Prior to Visit  Medication Sig Dispense Refill  . albuterol (VENTOLIN HFA) 108 (90 Base) MCG/ACT inhaler Inhale 2 puffs into the lungs every 4 (four) hours as needed for wheezing or shortness of breath. 1 each 11  . allopurinol (ZYLOPRIM) 100 MG tablet TAKE 2 TABLETS BY MOUTH EVERY DAY 180 tablet 1  . AMBULATORY NON FORMULARY MEDICATION Rollator  Dx: Gait instability 1 Device 0  . Ascorbic Acid (VITAMIN C PO) Take 1 tablet by mouth daily.     Marland Kitchen aspirin 81 MG tablet Take 81 mg by mouth daily.    . Budeson-Glycopyrrol-Formoterol (BREZTRI AEROSPHERE) 160-9-4.8 MCG/ACT AERO Inhale 2 puffs into the lungs in the morning and at bedtime. 1 each 11  . chlorpheniramine-HYDROcodone (TUSSIONEX) 10-8 MG/5ML Take 5 mLs by mouth every 12 (twelve) hours as needed for cough. 115 mL 0  . colchicine 0.6 MG tablet Take 2 tabs at first and then 1 tab q 2 hours til pain relief or max of 6 tabs in 24 hours (Patient taking differently: Take 2 tabs at first and then 1 tab q 2 hours til pain relief or max of 6 tabs in 24 hours - PRN for gout flare-up) 6 tablet 1  . DYMISTA 137-50 MCG/ACT SUSP 1-2 SPRAYS PER NOSTRIL TWICE DAILY FOR RUNNY NOSE 69 g 1  . ezetimibe (ZETIA) 10 MG tablet TAKE 1 TABLET BY MOUTH EVERY DAY 90 tablet 1  . famotidine (PEPCID) 40 MG tablet TAKE 1 TABLET BY MOUTH EVERYDAY AT BEDTIME 90 tablet 1  . furosemide (LASIX) 40 MG tablet TAKE 1 TABLET BY MOUTH EVERY DAY 90 tablet 0  . KLOR-CON M20 20 MEQ tablet TAKE 1 TABLET BY MOUTH DAILY. KEEP UPCOMING APPOINTMENT 90 tablet 3  . levothyroxine (SYNTHROID) 112 MCG tablet TAKE 1 TABLET BY MOUTH EVERY DAY BEFORE BREAKFAST 90 tablet 1  . Multiple Vitamins-Minerals (ICAPS AREDS 2) CAPS Take 1 capsule by mouth 2 (two) times daily.    Marland Kitchen PREVACID 30 MG capsule TAKE 1 CAPSULE BY MOUTH TWICE A DAY --MAX LIMIT 180 capsule 1  . propranolol (INDERAL) 60 MG tablet TAKE 1 TABLET BY MOUTH 2 TIMES DAILY. 180 tablet 0  . Red Yeast Rice Extract  (RED YEAST RICE PO) Take by mouth daily.     No facility-administered medications prior to visit.    Allergies  Allergen Reactions  . Amoxicillin Rash  . Ampicillin Rash  . Penicillins Rash  . Statins Other (See Comments)    Per reports muscle aches and joint pain Per reports muscle aches and  joint pain    Review of Systems  Constitutional:  Positive for malaise/fatigue. Negative for fever.  HENT:  Negative for congestion.   Eyes:  Negative for blurred vision.  Respiratory:  Negative for shortness of breath.   Cardiovascular:  Negative for chest pain, palpitations and leg swelling.  Gastrointestinal:  Negative for abdominal pain, blood in stool and nausea.  Genitourinary:  Negative for dysuria and frequency.  Musculoskeletal:  Positive for joint pain and neck pain. Negative for falls.  Skin:  Negative for rash.  Neurological:  Negative for dizziness, loss of consciousness and headaches.  Endo/Heme/Allergies:  Negative for environmental allergies.  Psychiatric/Behavioral:  Negative for depression. The patient is not nervous/anxious.       Objective:    Physical Exam Constitutional:      General: She is not in acute distress.    Appearance: Normal appearance. She is obese. She is not diaphoretic.  HENT:     Head: Normocephalic and atraumatic.     Right Ear: Tympanic membrane, ear canal and external ear normal.     Left Ear: Tympanic membrane, ear canal and external ear normal.     Nose: Nose normal.     Mouth/Throat:     Mouth: Mucous membranes are moist.     Pharynx: Oropharynx is clear. No oropharyngeal exudate.  Eyes:     General: No scleral icterus.       Right eye: No discharge.        Left eye: No discharge.     Conjunctiva/sclera: Conjunctivae normal.     Pupils: Pupils are equal, round, and reactive to light.  Neck:     Thyroid: No thyromegaly.  Cardiovascular:     Rate and Rhythm: Normal rate and regular rhythm.     Heart sounds: Normal heart sounds. No  murmur heard. Pulmonary:     Effort: Pulmonary effort is normal. No respiratory distress.     Breath sounds: Normal breath sounds. No wheezing or rales.  Abdominal:     General: Bowel sounds are normal. There is no distension.     Palpations: Abdomen is soft. There is no mass.     Tenderness: There is no abdominal tenderness.  Musculoskeletal:        General: No tenderness. Normal range of motion.     Cervical back: Normal range of motion and neck supple.  Lymphadenopathy:     Cervical: No cervical adenopathy.  Skin:    General: Skin is warm and dry.     Findings: No rash.  Neurological:     General: No focal deficit present.     Mental Status: She is alert and oriented to person, place, and time.     Cranial Nerves: No cranial nerve deficit.     Coordination: Coordination normal.     Deep Tendon Reflexes: Reflexes are normal and symmetric. Reflexes normal.  Psychiatric:        Mood and Affect: Mood normal.        Behavior: Behavior normal.        Thought Content: Thought content normal.        Judgment: Judgment normal.   BP (!) 144/62 (BP Location: Left Arm, Patient Position: Sitting, Cuff Size: Large)   Pulse (!) 53   Temp (!) 97.3 F (36.3 C) (Oral)   Resp 20   Ht 5\' 1"  (1.549 m)   Wt 230 lb 3.2 oz (104.4 kg)   SpO2 96%   BMI 43.50 kg/m  Wt Readings from  Last 3 Encounters:  11/29/22 230 lb 3.2 oz (104.4 kg)  11/16/22 238 lb 2 oz (108 kg)  08/23/22 235 lb (106.6 kg)    Diabetic Foot Exam - Simple   No data filed    Lab Results  Component Value Date   WBC 8.8 05/30/2022   HGB 13.3 05/30/2022   HCT 40.3 05/30/2022   PLT 325.0 05/30/2022   GLUCOSE 87 05/30/2022   CHOL 178 05/30/2022   TRIG 155.0 (H) 05/30/2022   HDL 49.10 05/30/2022   LDLDIRECT 106.0 11/25/2021   LDLCALC 98 05/30/2022   ALT 9 05/30/2022   AST 16 05/30/2022   NA 140 05/30/2022   K 4.4 05/30/2022   CL 101 05/30/2022   CREATININE 0.91 05/30/2022   BUN 22 05/30/2022   CO2 30 05/30/2022    TSH 2.32 05/30/2022   HGBA1C 5.7 05/30/2022    Lab Results  Component Value Date   TSH 2.32 05/30/2022   Lab Results  Component Value Date   WBC 8.8 05/30/2022   HGB 13.3 05/30/2022   HCT 40.3 05/30/2022   MCV 96.3 05/30/2022   PLT 325.0 05/30/2022   Lab Results  Component Value Date   NA 140 05/30/2022   K 4.4 05/30/2022   CO2 30 05/30/2022   GLUCOSE 87 05/30/2022   BUN 22 05/30/2022   CREATININE 0.91 05/30/2022   BILITOT 0.4 05/30/2022   ALKPHOS 70 05/30/2022   AST 16 05/30/2022   ALT 9 05/30/2022   PROT 6.5 05/30/2022   ALBUMIN 4.0 05/30/2022   CALCIUM 9.3 05/30/2022   GFR 58.77 (L) 05/30/2022   Lab Results  Component Value Date   CHOL 178 05/30/2022   Lab Results  Component Value Date   HDL 49.10 05/30/2022   Lab Results  Component Value Date   LDLCALC 98 05/30/2022   Lab Results  Component Value Date   TRIG 155.0 (H) 05/30/2022   Lab Results  Component Value Date   CHOLHDL 4 05/30/2022   Lab Results  Component Value Date   HGBA1C 5.7 05/30/2022       Assessment & Plan:  Preventative health care Assessment & Plan: Patient encouraged to maintain heart healthy diet, regular exercise, adequate sleep. Consider daily probiotics. Take medications as prescribed. Labs ordered and reviewed. Given and reviewed copy of ACP documents from [facility] Secretary of State and encouraged to complete and return    Neck pain Assessment & Plan: With radicular pain down right arm  Orders: -     Ambulatory referral to Orthopedic Surgery -     CBC with Differential/Platelet; Future  Osteopenia, unspecified location -     DG Bone Density; Future  Estrogen deficiency -     DG Bone Density; Future  Post-menopausal -     DG Bone Density; Future  Vitamin D deficiency -     Vitamin D 1,25 dihydroxy  Hypothyroidism, unspecified type -     TSH  Hyperglycemia -     Comprehensive metabolic panel -     Hemoglobin A1c; Future  Essential hypertension,  benign  Hyperlipidemia, unspecified hyperlipidemia type -     Lipid panel  Other orders -     tiZANidine HCl; Take 0.5-2 tablets (1-4 mg total) by mouth every 6 (six) hours as needed for muscle spasms.  Dispense: 30 tablet; Refill: 0    Assessment and Plan    Telogen Effluvium Hair loss for the past month, diagnosed by dermatologist. Likely due to chronic pain stressor. -Expect hair  regrowth as this is a cyclical condition.  Chronic Neck and Arm Pain Ongoing for a year, likely due to degenerative changes and arthritis in the neck. Pain is worse at night. Voltaren gel has provided some relief. -Refer to orthopedics for further management and potential interventions. -Start Tizanidine 1-4 mg at bedtime, with flexibility to adjust dose based on response and side effects.  Osteopenia Last DEXA scan two years ago showed osteopenia. -Repeat DEXA scan next month to monitor bone density.  General Health Maintenance -Continue Voltaren gel for neck pain. -Encourage regular hydration and protein intake. -Flu and COVID vaccines received last week at [facility]. -Order lab work for routine monitoring. -Schedule follow-up appointment in six months. -Recheck blood pressure due to elevated reading during visit.         Danise Edge, MD

## 2022-11-29 NOTE — Patient Instructions (Signed)
Hypertension, Adult High blood pressure (hypertension) is when the force of blood pumping through the arteries is too strong. The arteries are the blood vessels that carry blood from the heart throughout the body. Hypertension forces the heart to work harder to pump blood and may cause arteries to become narrow or stiff. Untreated or uncontrolled hypertension can lead to a heart attack, heart failure, a stroke, kidney disease, and other problems. A blood pressure reading consists of a higher number over a lower number. Ideally, your blood pressure should be below 120/80. The first ("top") number is called the systolic pressure. It is a measure of the pressure in your arteries as your heart beats. The second ("bottom") number is called the diastolic pressure. It is a measure of the pressure in your arteries as the heart relaxes. What are the causes? The exact cause of this condition is not known. There are some conditions that result in high blood pressure. What increases the risk? Certain factors may make you more likely to develop high blood pressure. Some of these risk factors are under your control, including: Smoking. Not getting enough exercise or physical activity. Being overweight. Having too much fat, sugar, calories, or salt (sodium) in your diet. Drinking too much alcohol. Other risk factors include: Having a personal history of heart disease, diabetes, high cholesterol, or kidney disease. Stress. Having a family history of high blood pressure and high cholesterol. Having obstructive sleep apnea. Age. The risk increases with age. What are the signs or symptoms? High blood pressure may not cause symptoms. Very high blood pressure (hypertensive crisis) may cause: Headache. Fast or irregular heartbeats (palpitations). Shortness of breath. Nosebleed. Nausea and vomiting. Vision changes. Severe chest pain, dizziness, and seizures. How is this diagnosed? This condition is diagnosed by  measuring your blood pressure while you are seated, with your arm resting on a flat surface, your legs uncrossed, and your feet flat on the floor. The cuff of the blood pressure monitor will be placed directly against the skin of your upper arm at the level of your heart. Blood pressure should be measured at least twice using the same arm. Certain conditions can cause a difference in blood pressure between your right and left arms. If you have a high blood pressure reading during one visit or you have normal blood pressure with other risk factors, you may be asked to: Return on a different day to have your blood pressure checked again. Monitor your blood pressure at home for 1 week or longer. If you are diagnosed with hypertension, you may have other blood or imaging tests to help your health care provider understand your overall risk for other conditions. How is this treated? This condition is treated by making healthy lifestyle changes, such as eating healthy foods, exercising more, and reducing your alcohol intake. You may be referred for counseling on a healthy diet and physical activity. Your health care provider may prescribe medicine if lifestyle changes are not enough to get your blood pressure under control and if: Your systolic blood pressure is above 130. Your diastolic blood pressure is above 80. Your personal target blood pressure may vary depending on your medical conditions, your age, and other factors. Follow these instructions at home: Eating and drinking  Eat a diet that is high in fiber and potassium, and low in sodium, added sugar, and fat. An example of this eating plan is called the DASH diet. DASH stands for Dietary Approaches to Stop Hypertension. To eat this way: Eat   plenty of fresh fruits and vegetables. Try to fill one half of your plate at each meal with fruits and vegetables. Eat whole grains, such as whole-wheat pasta, brown rice, or whole-grain bread. Fill about one  fourth of your plate with whole grains. Eat or drink low-fat dairy products, such as skim milk or low-fat yogurt. Avoid fatty cuts of meat, processed or cured meats, and poultry with skin. Fill about one fourth of your plate with lean proteins, such as fish, chicken without skin, beans, eggs, or tofu. Avoid pre-made and processed foods. These tend to be higher in sodium, added sugar, and fat. Reduce your daily sodium intake. Many people with hypertension should eat less than 1,500 mg of sodium a day. Do not drink alcohol if: Your health care provider tells you not to drink. You are pregnant, may be pregnant, or are planning to become pregnant. If you drink alcohol: Limit how much you have to: 0-1 drink a day for women. 0-2 drinks a day for men. Know how much alcohol is in your drink. In the [place] one drink equals one 12 oz bottle of beer (355 mL), one 5 oz glass of wine (148 mL), or one 1 oz glass of hard liquor (44 mL). Lifestyle  Work with your health care provider to maintain a healthy body weight or to lose weight. Ask what an ideal weight is for you. Get at least 30 minutes of exercise that causes your heart to beat faster (aerobic exercise) most days of the week. Activities may include walking, swimming, or biking. Include exercise to strengthen your muscles (resistance exercise), such as Pilates or lifting weights, as part of your weekly exercise routine. Try to do these types of exercises for 30 minutes at least 3 days a week. Do not use any products that contain nicotine or tobacco. These products include cigarettes, chewing tobacco, and vaping devices, such as e-cigarettes. If you need help quitting, ask your health care provider. Monitor your blood pressure at home as told by your health care provider. Keep all follow-up visits. This is important. Medicines Take over-the-counter and prescription medicines only as told by your health care provider. Follow directions carefully. Blood  pressure medicines must be taken as prescribed. Do not skip doses of blood pressure medicine. Doing this puts you at risk for problems and can make the medicine less effective. Ask your health care provider about side effects or reactions to medicines that you should watch for. Contact a health care provider if you: Think you are having a reaction to a medicine you are taking. Have headaches that keep coming back (recurring). Feel dizzy. Have swelling in your ankles. Have trouble with your vision. Get help right away if you: Develop a severe headache or confusion. Have unusual weakness or numbness. Feel faint. Have severe pain in your chest or abdomen. Vomit repeatedly. Have trouble breathing. These symptoms may be an emergency. Get help right away. Call 911. Do not wait to see if the symptoms will go away. Do not drive yourself to the hospital. Summary Hypertension is when the force of blood pumping through your arteries is too strong. If this condition is not controlled, it may put you at risk for serious complications. Your personal target blood pressure may vary depending on your medical conditions, your age, and other factors. For most people, a normal blood pressure is less than 120/80. Hypertension is treated with lifestyle changes, medicines, or a combination of both. Lifestyle changes include losing weight, eating a healthy,   low-sodium diet, exercising more, and limiting alcohol. This information is not intended to replace advice given to you by your health care provider. Make sure you discuss any questions you have with your health care provider. Document Revised: 12/01/2020 Document Reviewed: 12/01/2020 Elsevier Patient Education  2024 [employer]  

## 2022-11-30 LAB — CBC WITH DIFFERENTIAL/PLATELET
Basophils Absolute: 0.1 10*3/uL (ref 0.0–0.1)
Basophils Relative: 1.4 % (ref 0.0–3.0)
Eosinophils Absolute: 0.2 10*3/uL (ref 0.0–0.7)
Eosinophils Relative: 2.3 % (ref 0.0–5.0)
HCT: 42 % (ref 36.0–46.0)
Hemoglobin: 13.2 g/dL (ref 12.0–15.0)
Lymphocytes Relative: 24.1 % (ref 12.0–46.0)
Lymphs Abs: 2.1 10*3/uL (ref 0.7–4.0)
MCHC: 31.5 g/dL (ref 30.0–36.0)
MCV: 98.1 fL (ref 78.0–100.0)
Monocytes Absolute: 0.7 10*3/uL (ref 0.1–1.0)
Monocytes Relative: 8.1 % (ref 3.0–12.0)
Neutro Abs: 5.7 10*3/uL (ref 1.4–7.7)
Neutrophils Relative %: 64.1 % (ref 43.0–77.0)
Platelets: 339 10*3/uL (ref 150.0–400.0)
RBC: 4.28 Mil/uL (ref 3.87–5.11)
RDW: 15.5 % (ref 11.5–15.5)
WBC: 8.9 10*3/uL (ref 4.0–10.5)

## 2022-12-03 LAB — VITAMIN D 1,25 DIHYDROXY
Vitamin D 1, 25 (OH)2 Total: 26 pg/mL (ref 18–72)
Vitamin D2 1, 25 (OH)2: <8 pg/mL
Vitamin D3 1, 25 (OH)2: 26 pg/mL

## 2022-12-08 ENCOUNTER — Other Ambulatory Visit: Payer: Self-pay | Admitting: Family Medicine

## 2022-12-12 NOTE — Progress Notes (Unsigned)
Assessment/Plan:    1.  Essential Tremor  -decrease propranolol to 40 mg bid.  This is because of bradycardia.  She is asymptomatic, but I do not like how low her pulse is getting.  Tremor is actually very well-controlled, but nonetheless, I wanted to decrease the medication.  She had no objection to that.  She will let me know how she does. 2.  History of gabapentin induced myoclonus  -Off of gabapentin 3.  Follow-up 1 year. Subjective:   Amanda Copeland was seen today in follow up for essential tremor.  My previous records were reviewed prior to todays visit.  Patient is with her husband who supplements the history.  She continues on propranolol.  She has not had any syncope/near syncope.  Tremor is "pretty well controlled."    Current prescribed movement disorder medications: Propranolol, 60 mg bid   PREVIOUS MEDICATIONS:  propranolol ; metoprolol (didn't help); primidone (made too sleepy and didn't help at 100 mg daily); topamax (not helpful and SE); gabapentin (myoclonus)  ALLERGIES:   Allergies  Allergen Reactions   Amoxicillin Rash   Ampicillin Rash   Penicillins Rash   Statins Other (See Comments)    Per reports muscle aches and joint pain Per reports muscle aches and joint pain    CURRENT MEDICATIONS:  Outpatient Encounter Medications as of 12/14/2022  Medication Sig   allopurinol (ZYLOPRIM) 100 MG tablet TAKE 2 TABLETS BY MOUTH EVERY DAY   AMBULATORY NON FORMULARY MEDICATION Rollator  Dx: Gait instability   Ascorbic Acid (VITAMIN C PO) Take 1 tablet by mouth daily.    aspirin 81 MG tablet Take 81 mg by mouth daily.   Budeson-Glycopyrrol-Formoterol (BREZTRI AEROSPHERE) 160-9-4.8 MCG/ACT AERO Inhale 2 puffs into the lungs in the morning and at bedtime.   Budeson-Glycopyrrol-Formoterol (BREZTRI AEROSPHERE) 160-9-4.8 MCG/ACT AERO 2 puffs Inhalation Twice a day   chlorpheniramine-HYDROcodone (TUSSIONEX) 10-8 MG/5ML Take 5 mLs by mouth every 12 (twelve) hours as needed  for cough.   colchicine 0.6 MG tablet Take 2 tabs at first and then 1 tab q 2 hours til pain relief or max of 6 tabs in 24 hours (Patient taking differently: Take 2 tabs at first and then 1 tab q 2 hours til pain relief or max of 6 tabs in 24 hours - PRN for gout flare-up)   DYMISTA 137-50 MCG/ACT SUSP 1-2 SPRAYS PER NOSTRIL TWICE DAILY FOR RUNNY NOSE   ezetimibe (ZETIA) 10 MG tablet TAKE 1 TABLET BY MOUTH EVERY DAY   famotidine (PEPCID) 40 MG tablet TAKE 1 TABLET BY MOUTH EVERYDAY AT BEDTIME   furosemide (LASIX) 40 MG tablet TAKE 1 TABLET BY MOUTH EVERY DAY   KLOR-CON M20 20 MEQ tablet TAKE 1 TABLET BY MOUTH DAILY. KEEP UPCOMING APPOINTMENT   levothyroxine (SYNTHROID) 112 MCG tablet TAKE 1 TABLET BY MOUTH EVERY DAY BEFORE BREAKFAST   Multiple Vitamins-Minerals (ICAPS AREDS 2) CAPS Take 1 capsule by mouth 2 (two) times daily.   PREVACID 30 MG capsule TAKE 1 CAPSULE BY MOUTH TWICE A DAY --MAX LIMIT   propranolol (INDERAL) 40 MG tablet Take 1 tablet (40 mg total) by mouth 2 (two) times daily.   Red Yeast Rice Extract (RED YEAST RICE PO) Take by mouth daily.   tiZANidine (ZANAFLEX) 2 MG tablet TAKE 0.5-2 TABLETS BY MOUTH EVERY 6 HOURS AS NEEDED FOR MUSCLE SPASMS.   [DISCONTINUED] propranolol (INDERAL) 60 MG tablet TAKE 1 TABLET BY MOUTH 2 TIMES DAILY.   albuterol (VENTOLIN HFA) 108 (90 Base)  MCG/ACT inhaler Inhale 2 puffs into the lungs every 4 (four) hours as needed for wheezing or shortness of breath. (Patient not taking: Reported on 12/14/2022)   No facility-administered encounter medications on file as of 12/14/2022.     Objective:    PHYSICAL EXAMINATION:    VITALS:   Vitals:   12/14/22 1123  BP: 110/62  Pulse: (!) 51  SpO2: 97%  Weight: 235 lb 9.6 oz (106.9 kg)  Height: 5\' 1"  (1.549 m)   GEN:  The patient appears stated age and is in NAD. HEENT:  Normocephalic, atraumatic.  The mucous membranes are moist. The superficial temporal arteries are without ropiness or  tenderness. Cardiovascular: Bradycardic.  Regular. Lungs: Clear to auscultation bilaterally  Neurological examination:  Orientation: The patient is alert and oriented x3. Cranial nerves: There is good facial symmetry. The speech is fluent and clear. Soft palate rises symmetrically and there is no tongue deviation. Hearing is intact to conversational tone. Sensation: Sensation is intact to light touch throughout Motor: Strength is at least antigravity x4.  Movement examination: Tone: There is normal tone in the UE/LE Abnormal movements: There is no rest tremor.  There is no postural tremor (same as previous).  There is no intention tremor.   Coordination:  There is no decremation with RAM's Gait and Station: Not tested today. I have reviewed and interpreted the following labs independently   Chemistry      Component Value Date/Time   NA 143 11/29/2022 1207   NA 145 (H) 06/11/2019 1141   K 4.2 11/29/2022 1207   CL 103 11/29/2022 1207   CO2 28 11/29/2022 1207   BUN 22 11/29/2022 1207   BUN 10 06/11/2019 1141   CREATININE 0.90 11/29/2022 1207      Component Value Date/Time   CALCIUM 9.4 11/29/2022 1207   ALKPHOS 72 11/29/2022 1207   AST 16 11/29/2022 1207   ALT 9 11/29/2022 1207   BILITOT 0.4 11/29/2022 1207      Lab Results  Component Value Date   WBC 8.9 11/29/2022   HGB 13.2 11/29/2022   HCT 42.0 11/29/2022   MCV 98.1 11/29/2022   PLT 339.0 11/29/2022   Lab Results  Component Value Date   TSH 2.06 11/29/2022     Chemistry      Component Value Date/Time   NA 143 11/29/2022 1207   NA 145 (H) 06/11/2019 1141   K 4.2 11/29/2022 1207   CL 103 11/29/2022 1207   CO2 28 11/29/2022 1207   BUN 22 11/29/2022 1207   BUN 10 06/11/2019 1141   CREATININE 0.90 11/29/2022 1207      Component Value Date/Time   CALCIUM 9.4 11/29/2022 1207   ALKPHOS 72 11/29/2022 1207   AST 16 11/29/2022 1207   ALT 9 11/29/2022 1207   BILITOT 0.4 11/29/2022 1207        Cc:  Bradd Canary, MD

## 2022-12-14 ENCOUNTER — Encounter: Payer: Self-pay | Admitting: Neurology

## 2022-12-14 ENCOUNTER — Ambulatory Visit (INDEPENDENT_AMBULATORY_CARE_PROVIDER_SITE_OTHER): Payer: Medicare Other | Admitting: Neurology

## 2022-12-14 VITALS — BP 110/62 | HR 51 | Ht 61.0 in | Wt 235.6 lb

## 2022-12-14 DIAGNOSIS — R001 Bradycardia, unspecified: Secondary | ICD-10-CM

## 2022-12-14 DIAGNOSIS — G250 Essential tremor: Secondary | ICD-10-CM | POA: Diagnosis not present

## 2022-12-14 MED ORDER — PROPRANOLOL HCL 40 MG PO TABS: 40.0000 mg | ORAL_TABLET | Freq: Two times a day (BID) | ORAL | 3 refills | Status: DC

## 2022-12-14 NOTE — Patient Instructions (Signed)
Take propranolol 40 mg twice per day  The physicians and staff at [facility] are committed to providing excellent care. You may receive a survey requesting feedback about your experience at our office. We strive to receive "very good" responses to the survey questions. If you feel that your experience would prevent you from giving the office a "very good " response, please contact our office to try to remedy the situation. We may be reached at [phone]. Thank you for taking the time out of your busy day to complete the survey.

## 2022-12-26 ENCOUNTER — Ambulatory Visit (HOSPITAL_BASED_OUTPATIENT_CLINIC_OR_DEPARTMENT_OTHER)
Admission: RE | Admit: 2022-12-26 | Discharge: 2022-12-26 | Disposition: A | Discharge status: Home / Self Care | Payer: Medicare Other | Source: Ambulatory Visit | Attending: Family Medicine | Admitting: Family Medicine

## 2022-12-26 DIAGNOSIS — Z780 Asymptomatic menopausal state: Secondary | ICD-10-CM | POA: Insufficient documentation

## 2022-12-26 DIAGNOSIS — M8580 Other specified disorders of bone density and structure, unspecified site: Secondary | ICD-10-CM | POA: Diagnosis not present

## 2022-12-26 DIAGNOSIS — E2839 Other primary ovarian failure: Secondary | ICD-10-CM | POA: Insufficient documentation

## 2022-12-26 DIAGNOSIS — M85852 Other specified disorders of bone density and structure, left thigh: Secondary | ICD-10-CM | POA: Diagnosis not present

## 2023-01-09 ENCOUNTER — Encounter: Payer: Self-pay | Admitting: Family Medicine

## 2023-01-20 DIAGNOSIS — M25511 Pain in right shoulder: Secondary | ICD-10-CM | POA: Diagnosis not present

## 2023-01-20 DIAGNOSIS — M542 Cervicalgia: Secondary | ICD-10-CM | POA: Diagnosis not present

## 2023-01-28 ENCOUNTER — Other Ambulatory Visit: Payer: Self-pay | Admitting: Family Medicine

## 2023-01-28 DIAGNOSIS — J309 Allergic rhinitis, unspecified: Secondary | ICD-10-CM

## 2023-01-28 NOTE — Telephone Encounter (Signed)
Prolia VOB initiated via [email or web address]  Next Prolia inj DUE: 1/3/5  [employer] will not run benefits between 02/08/23-02/14/23. Benefits scheduled to run 02/15/23. Will need to hold off on Prolia injection until 2025 benefits are determined.

## 2023-01-29 ENCOUNTER — Other Ambulatory Visit: Payer: Self-pay | Admitting: Family Medicine

## 2023-02-02 ENCOUNTER — Telehealth: Payer: Self-pay | Admitting: Physical Medicine and Rehabilitation

## 2023-02-02 NOTE — Telephone Encounter (Signed)
Patient called and wants to make an appointment for injection in her lower back. She can hardly walk. CB#[phone]

## 2023-02-18 NOTE — Telephone Encounter (Signed)
 Pending

## 2023-02-22 ENCOUNTER — Other Ambulatory Visit: Payer: Self-pay | Admitting: Neurology

## 2023-02-22 DIAGNOSIS — Z86008 Personal history of in-situ neoplasm of other site: Secondary | ICD-10-CM | POA: Diagnosis not present

## 2023-02-22 DIAGNOSIS — L821 Other seborrheic keratosis: Secondary | ICD-10-CM | POA: Diagnosis not present

## 2023-02-22 DIAGNOSIS — G250 Essential tremor: Secondary | ICD-10-CM

## 2023-02-22 DIAGNOSIS — L570 Actinic keratosis: Secondary | ICD-10-CM | POA: Diagnosis not present

## 2023-02-22 DIAGNOSIS — Z129 Encounter for screening for malignant neoplasm, site unspecified: Secondary | ICD-10-CM | POA: Diagnosis not present

## 2023-02-23 ENCOUNTER — Encounter: Payer: Self-pay | Admitting: Physical Medicine and Rehabilitation

## 2023-02-23 ENCOUNTER — Ambulatory Visit (INDEPENDENT_AMBULATORY_CARE_PROVIDER_SITE_OTHER): Payer: Medicare Other | Admitting: Physical Medicine and Rehabilitation

## 2023-02-23 DIAGNOSIS — G8929 Other chronic pain: Secondary | ICD-10-CM

## 2023-02-23 DIAGNOSIS — M5416 Radiculopathy, lumbar region: Secondary | ICD-10-CM

## 2023-02-23 DIAGNOSIS — M5442 Lumbago with sciatica, left side: Secondary | ICD-10-CM | POA: Diagnosis not present

## 2023-02-23 DIAGNOSIS — M48062 Spinal stenosis, lumbar region with neurogenic claudication: Secondary | ICD-10-CM | POA: Diagnosis not present

## 2023-02-23 DIAGNOSIS — M5441 Lumbago with sciatica, right side: Secondary | ICD-10-CM

## 2023-02-23 DIAGNOSIS — M47816 Spondylosis without myelopathy or radiculopathy, lumbar region: Secondary | ICD-10-CM

## 2023-02-23 NOTE — Progress Notes (Signed)
Amanda Copeland - 83 y.o. female MRN [id number]  Date of birth: 03-31-1940  Office Visit Note: Visit Date: 02/23/2023 PCP: Bradd Canary, MD Referred by: Bradd Canary, MD  Subjective: Chief Complaint  Patient presents with   Lower Back - Pain   HPI: Amanda Copeland is a 83 y.o. female who comes in today for evaluation of Chronic, worsening and severe bilateral lower back pain radiating down to buttocks and down both legs, left greater than right.  Pain ongoing for several years, worsens with prolonged standing and walking. She describes pain as tight and pulling sensation, currently rates as 8 out of 10. Sitting seems to significantly alleviate her discomfort. Patient reports some relief of pain with rest and use of over the counter medications as needed. Patient also reports history of formal physical therapy in 2021 at [facility], she reports some relief of pain with these treatments. Lumbar MRI imaging from 2023 exhibits  multi-level facet hypertrophy, most prominent at L4-L5 where there is also  severe multifactorial spinal canal stenosis. She has undergone multiple lumbar injections in our office over the years including lumbar facet joint injections and lumbar epidural steroid injections. Most recent injection was left L5-S1 interlaminar epidural steroid injection on 06/28/2022. She reports increase in functional ability post injection for several weeks, however her pain did not worsen until December of 2024. She is using rolling walker to assist with ambulation. Patient denies focal weakness. No recent trauma or falls.      Review of Systems  Musculoskeletal:  Positive for back pain.  Neurological:  Positive for tingling. Negative for focal weakness and weakness.  All other systems reviewed and are negative.  Otherwise per HPI.  Assessment & Plan: Visit Diagnoses:    ICD-10-CM   1. Chronic bilateral low back pain with bilateral sciatica  M54.42 Ambulatory  referral to Physical Medicine Rehab   M54.41    G89.29     2. Radiculopathy, lumbar region  M54.16 Ambulatory referral to Physical Medicine Rehab    3. Facet arthropathy, lumbar  M47.816 Ambulatory referral to Physical Medicine Rehab    4. Spinal stenosis of lumbar region with neurogenic claudication  M48.062 Ambulatory referral to Physical Medicine Rehab       Plan: Findings:  Chronic, worsening and severe bilateral lower back pain radiating down both legs, left greater than right.  She continues to have severe pain despite good conservative therapy such as formal physical therapy, home exercise regimen, rest and use of medications.  Patient's clinical presentation and exam are consistent with neurogenic claudication as a result of spinal canal stenosis.  There is severe central canal stenosis noted at the level of L4-L5.  We discussed treatment plan in detail today.  Next step is to perform diagnostic and hopefully therapeutic bilateral L4 transforaminal epidural steroid injection under fluoroscopic guidance. I explained to her that this is a different type of injection approach and involves 2 injections, she has no questions at this time.  If good relief of pain with injection we can repeat this procedure infrequently as needed.  I encouraged patient to remain active as tolerated. No red flag symptoms noted upon exam today.     Meds & Orders: No orders of the defined types were placed in this encounter.   Orders Placed This Encounter  Procedures   Ambulatory referral to Physical Medicine Rehab    Follow-up: Return for Bilateral L4 transforaminal epidural steroid injection. .   Procedures: No procedures performed  Clinical History: EXAM: MRI LUMBAR SPINE WITHOUT CONTRAST   TECHNIQUE: Multiplanar, multisequence MR imaging of the lumbar spine was performed. No intravenous contrast was administered.   COMPARISON:  MRI of the lumbar spine April 05, 2019.    FINDINGS: Segmentation:  Standard.   Alignment: Small anterolisthesis of L4 over L5 and L5 over S1, unchanged.   Vertebrae: No acute fracture, evidence of discitis, or bone lesion. Chronic compression fracture of the T11 superior endplate with associated Schmorl node, unchanged. Congenitally small spinal canal.   Conus medullaris and cauda equina: Conus extends to the T12-L1 level. Conus and cauda equina appear normal.   Paraspinal and other soft tissues: Negative.   Disc levels:   T12-L1: No spinal canal or neural foraminal stenosis.   L1-2: Shallow disc bulge and mild facet degenerative changes resulting in mild bilateral neural foraminal narrowing.   L2-3: Disc bulge with superimposed small central disc protrusion, moderate hypertrophic facet degenerative changes with bilateral joint effusion and ligamentum flavum redundancy resulting in mild spinal canal stenosis with narrowing of the bilateral subarticular zones, moderate right and moderate to severe left neural foraminal narrowing.   L3-4: Disc bulge, moderate to advanced hypertrophic facet degenerative changes and ligamentum flavum redundancy resulting in mild spinal canal stenosis with narrowing of the bilateral subarticular zones, mild right and moderate left neural foraminal narrowing. Left neural foraminal narrowing appear progressed since prior MRI.   L4-5: Anterolisthesis, disc bulge with new left foraminal disc protrusion, prominent hypertrophic facet degenerative changes and ligamentum flavum redundancy resulting in severe spinal canal stenosis, moderate right and moderate to severe left neural foraminal narrowing.   L5-S1: Prominent loss of disc height, shallow disc bulge and moderate hypertrophic facet degenerative changes without significant spinal canal or neural foraminal stenosis.   IMPRESSION: 1. Severe spinal canal stenosis at L4-5 with moderate right and moderate to severe left neural  foraminal narrowing. 2. Mild spinal canal stenosis with narrowing of the bilateral subarticular zones at L3-4 with mild right and moderate left neural foraminal narrowing. 3. Mild spinal canal stenosis with narrowing of the bilateral subarticular zones at L2-3 with moderate right and moderate to severe left neural foraminal narrowing.     Electronically Signed   By: Baldemar Lenis M.D.   On: 05/10/2021 14:09   She reports that she has never smoked. She has never used smokeless tobacco.  Recent Labs    05/30/22 1405 11/29/22 1207  HGBA1C 5.7 5.5  LABURIC 4.2  --     Objective:  VS:  HT:    WT:   BMI:     BP:   HR: bpm  TEMP: ( )  RESP:  Physical Exam Vitals and nursing note reviewed.  HENT:     Head: Normocephalic and atraumatic.     Right Ear: External ear normal.     Left Ear: External ear normal.     Nose: Nose normal.     Mouth/Throat:     Mouth: Mucous membranes are moist.  Eyes:     Extraocular Movements: Extraocular movements intact.  Cardiovascular:     Rate and Rhythm: Normal rate.     Pulses: Normal pulses.  Pulmonary:     Effort: Pulmonary effort is normal.  Abdominal:     General: Abdomen is flat. There is no distension.  Musculoskeletal:        General: Tenderness present.     Cervical back: Normal range of motion.     Comments: Patient is slow to rise  from seated position to standing. Good lumbar range of motion. No pain noted with facet loading. 5/5 strength noted with bilateral hip flexion, knee flexion/extension, ankle dorsiflexion/plantarflexion and EHL. No clonus noted bilaterally. No pain upon palpation of greater trochanters. No pain with internal/external rotation of bilateral hips. Sensation intact bilaterally. Negative slump test bilaterally. Ambulates without with rolling walker, gait slow and unsteady.    Skin:    General: Skin is warm and dry.     Capillary Refill: Capillary refill takes less than 2 seconds.   Neurological:     Mental Status: She is alert and oriented to person, place, and time.     Gait: Gait abnormal.     Ortho Exam  Imaging: No results found.  Past Medical/Family/Surgical/Social History: Medications & Allergies reviewed per EMR, new medications updated. Patient Active Problem List   Diagnosis Date Noted   Chronic bronchitis (HCC) 06/14/2022   Osteopenia 05/25/2021   Scapular dysfunction 03/17/2021   Compression fracture of T11 vertebra (HCC) 01/20/2021   SI joint arthritis (HCC) 01/20/2021   Vitamin D deficiency 01/20/2021   Age-related osteoporosis with current pathological fracture 11/26/2020   Pain of right upper extremity 11/11/2020   Bradycardia 11/11/2020   Neck pain 03/11/2020   Tremor 03/01/2020   Hypothyroid 03/01/2020   Physical deconditioning 11/25/2019   At risk for obstructive sleep apnea 11/25/2019   Primary osteoarthritis of right shoulder 02/28/2019   Other forms of dyspnea 11/22/2018   Educated about COVID-19 virus infection 07/03/2018   Greater trochanteric pain syndrome of right lower extremity 03/18/2018   Chronic nonallergic rhinitis 12/20/2017   Cough 12/20/2017   Right flank pain 12/10/2017   Fatigue 07/12/2017   Morbid obesity (HCC) 03/05/2017   Chronic diastolic heart failure (HCC) 11/29/2016   Dyspnea on exertion 10/06/2016   Asthma 10/06/2016   Preventative health care 05/22/2016   Hyperglycemia 08/07/2015   Allergic 08/07/2015   Gout 08/07/2015   Leukocytosis 08/07/2015   Lower abdominal pain 04/21/2015   Low back pain 11/02/2014   Esophageal reflux 10/28/2014   Essential hypertension, benign 10/28/2014   Hyperlipidemia 10/28/2014   Benign essential tremor 10/28/2014   Bilateral dry eyes    Moderate persistent asthma    Arthritis    Cancer (HCC)    History of chicken pox    Past Medical History:  Diagnosis Date   Arthritis    Asthma    childhood now returning   Back pain affecting pregnancy 11/02/2014   Benign  essential tremor 10/28/2014   Bilateral dry eyes    Breast cancer (HCC)    right   Cancer (HCC)    Breast   Dry eyes    Encounter for Medicare annual wellness exam 05/22/2016   Essential hypertension, benign 10/28/2014   H/O measles    H/O mumps    History of chicken pox    Lumbago 11/02/2014   Mixed hyperlipidemia 10/28/2014   Obesity 10/28/2014   Stenosis of cervical spine    Family History  Problem Relation Age of Onset   Heart disease Mother    Heart attack Mother    Heart failure Mother    Hypertension Mother    Cancer Father        colon   Asthma Father    Parkinson's disease Father    Cancer Maternal Grandmother        uterine   Heart disease Maternal Grandfather    Obesity Daughter    Appendicitis Paternal Grandmother    Stroke  Neg Hx    Past Surgical History:  Procedure Laterality Date   BACK SURGERY     BREAST REDUCTION SURGERY Left    CHOLECYSTECTOMY     EYE SURGERY  2008   b/l cataracts removed, in [place]   MASTECTOMY Right    REDUCTION MAMMAPLASTY     REPLACEMENT TOTAL KNEE BILATERAL Bilateral    ROTATOR CUFF REPAIR Left    Social History   Occupational History   Occupation: retired    Comment: Runner, 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

## 2023-02-23 NOTE — Progress Notes (Signed)
Lower back pain into both legs. Worse on the L side than the right side.  Advil for the pain and it does help with the pain.

## 2023-03-01 DIAGNOSIS — M19011 Primary osteoarthritis, right shoulder: Secondary | ICD-10-CM | POA: Diagnosis not present

## 2023-03-02 DIAGNOSIS — H43813 Vitreous degeneration, bilateral: Secondary | ICD-10-CM | POA: Diagnosis not present

## 2023-03-02 DIAGNOSIS — Z961 Presence of intraocular lens: Secondary | ICD-10-CM | POA: Diagnosis not present

## 2023-03-02 DIAGNOSIS — H353132 Nonexudative age-related macular degeneration, bilateral, intermediate dry stage: Secondary | ICD-10-CM | POA: Diagnosis not present

## 2023-03-02 DIAGNOSIS — H04123 Dry eye syndrome of bilateral lacrimal glands: Secondary | ICD-10-CM | POA: Diagnosis not present

## 2023-03-07 NOTE — Telephone Encounter (Signed)
Prior Authorization NOT required for [employer] and [address]

## 2023-03-07 NOTE — Telephone Encounter (Signed)
Patient is ready for scheduling on or after 02/10/23 BUY AND BILL  Out-of-pocket cost due at time of visit: $361.09 (either deductible has been met) Primary: Medicare Prolia co-insurance: 20% (approximately $331.87) Admin fee co-insurance: 20% (approximately $25)  Deductible: $0 of $257 met  Prior Auth: NOT required  Secondary: [employer] co-insurance: 20% (approx $66.37) Admin fee co-insurance: 20% (54.approx $5)  Deductible: $0 of $50 met  Prior Auth: NOT required PA# Valid:   ** This summary of benefits is an estimation of the patient's out-of-pocket cost. Exact cost may vary based on individual plan coverage.    Prolia cost - Medicare deductible - secondary deductible = $1,352.36  $1352.36 x 20% = $270.47  $270.47 x 20% = $54.09  $54.09 + Medicare deductible + secondary deductible =

## 2023-03-09 ENCOUNTER — Encounter: Payer: Self-pay | Admitting: *Deleted

## 2023-03-12 ENCOUNTER — Other Ambulatory Visit: Payer: Self-pay | Admitting: Family

## 2023-03-13 ENCOUNTER — Other Ambulatory Visit: Payer: Self-pay

## 2023-03-13 ENCOUNTER — Ambulatory Visit: Payer: Medicare Other | Admitting: Physical Medicine and Rehabilitation

## 2023-03-13 DIAGNOSIS — M5416 Radiculopathy, lumbar region: Secondary | ICD-10-CM | POA: Diagnosis not present

## 2023-03-13 MED ORDER — METHYLPREDNISOLONE ACETATE 40 MG/ML IJ SUSP
40.0000 mg | Freq: Once | INTRAMUSCULAR | Status: AC
  Administered 2023-03-13: 40 mg

## 2023-03-13 NOTE — Patient Instructions (Signed)

## 2023-03-13 NOTE — Progress Notes (Unsigned)
Functional Pain Scale - descriptive words and definitions  Uncomfortable (3)  Pain is present but can complete all ADL's/sleep is slightly affected and passive distraction only gives marginal relief. Mild range order  Average Pain 5  Standing she has a lot of pain and walking. L> R   She does have pain on her R side a lot just not as bad. +Driver, -BT, -Dye Allergies.

## 2023-03-15 ENCOUNTER — Encounter (HOSPITAL_BASED_OUTPATIENT_CLINIC_OR_DEPARTMENT_OTHER): Payer: Self-pay | Admitting: Radiology

## 2023-03-15 ENCOUNTER — Other Ambulatory Visit: Payer: Self-pay

## 2023-03-15 ENCOUNTER — Emergency Department (HOSPITAL_BASED_OUTPATIENT_CLINIC_OR_DEPARTMENT_OTHER)
Admission: EM | Admit: 2023-03-15 | Discharge: 2023-03-16 | Disposition: A | Discharge status: Home / Self Care | Payer: Medicare Other | Attending: Emergency Medicine | Admitting: Emergency Medicine

## 2023-03-15 DIAGNOSIS — R0989 Other specified symptoms and signs involving the circulatory and respiratory systems: Secondary | ICD-10-CM | POA: Diagnosis not present

## 2023-03-15 DIAGNOSIS — H02054 Trichiasis without entropian left upper eyelid: Secondary | ICD-10-CM | POA: Diagnosis not present

## 2023-03-15 DIAGNOSIS — Z7951 Long term (current) use of inhaled steroids: Secondary | ICD-10-CM | POA: Diagnosis not present

## 2023-03-15 DIAGNOSIS — Z7982 Long term (current) use of aspirin: Secondary | ICD-10-CM | POA: Insufficient documentation

## 2023-03-15 DIAGNOSIS — I6523 Occlusion and stenosis of bilateral carotid arteries: Secondary | ICD-10-CM | POA: Diagnosis not present

## 2023-03-15 DIAGNOSIS — M542 Cervicalgia: Secondary | ICD-10-CM | POA: Diagnosis not present

## 2023-03-15 DIAGNOSIS — H04123 Dry eye syndrome of bilateral lacrimal glands: Secondary | ICD-10-CM | POA: Diagnosis not present

## 2023-03-15 DIAGNOSIS — Z79899 Other long term (current) drug therapy: Secondary | ICD-10-CM | POA: Diagnosis not present

## 2023-03-15 DIAGNOSIS — R404 Transient alteration of awareness: Secondary | ICD-10-CM | POA: Diagnosis not present

## 2023-03-15 DIAGNOSIS — R519 Headache, unspecified: Secondary | ICD-10-CM | POA: Insufficient documentation

## 2023-03-15 DIAGNOSIS — R410 Disorientation, unspecified: Secondary | ICD-10-CM | POA: Diagnosis not present

## 2023-03-15 DIAGNOSIS — I10 Essential (primary) hypertension: Secondary | ICD-10-CM | POA: Diagnosis not present

## 2023-03-15 DIAGNOSIS — J45909 Unspecified asthma, uncomplicated: Secondary | ICD-10-CM | POA: Diagnosis not present

## 2023-03-15 DIAGNOSIS — E669 Obesity, unspecified: Secondary | ICD-10-CM | POA: Diagnosis not present

## 2023-03-15 DIAGNOSIS — I5032 Chronic diastolic (congestive) heart failure: Secondary | ICD-10-CM | POA: Diagnosis not present

## 2023-03-15 DIAGNOSIS — Z853 Personal history of malignant neoplasm of breast: Secondary | ICD-10-CM | POA: Insufficient documentation

## 2023-03-15 DIAGNOSIS — H0102A Squamous blepharitis right eye, upper and lower eyelids: Secondary | ICD-10-CM | POA: Diagnosis not present

## 2023-03-15 DIAGNOSIS — Z20822 Contact with and (suspected) exposure to covid-19: Secondary | ICD-10-CM | POA: Diagnosis not present

## 2023-03-15 DIAGNOSIS — R4182 Altered mental status, unspecified: Secondary | ICD-10-CM | POA: Diagnosis not present

## 2023-03-15 DIAGNOSIS — I672 Cerebral atherosclerosis: Secondary | ICD-10-CM | POA: Diagnosis not present

## 2023-03-15 DIAGNOSIS — H0102B Squamous blepharitis left eye, upper and lower eyelids: Secondary | ICD-10-CM | POA: Diagnosis not present

## 2023-03-15 DIAGNOSIS — I6782 Cerebral ischemia: Secondary | ICD-10-CM | POA: Diagnosis not present

## 2023-03-15 MED ORDER — ACETAMINOPHEN 500 MG PO TABS
1000.0000 mg | ORAL_TABLET | Freq: Once | ORAL | Status: AC
  Administered 2023-03-16: 1000 mg via ORAL
  Filled 2023-03-15: qty 2

## 2023-03-15 NOTE — ED Triage Notes (Signed)
 Pt states she is having extreme pain over the top of her right eye. Pt states the pain started last night. Went to her eye doctor today and was told she had a eye lash scrapping her eye. She was given drops. PT states the pain is above her eye and is now back worse than before.

## 2023-03-16 ENCOUNTER — Emergency Department (HOSPITAL_COMMUNITY): Payer: Medicare Other

## 2023-03-16 ENCOUNTER — Emergency Department (EMERGENCY_DEPARTMENT_HOSPITAL)
Admission: EM | Admit: 2023-03-16 | Discharge: 2023-03-17 | Disposition: A | Discharge status: Home / Self Care | Payer: Medicare Other | Source: Home / Self Care | Attending: Emergency Medicine | Admitting: Emergency Medicine

## 2023-03-16 ENCOUNTER — Encounter (HOSPITAL_COMMUNITY): Payer: Self-pay | Admitting: Emergency Medicine

## 2023-03-16 ENCOUNTER — Other Ambulatory Visit: Payer: Self-pay

## 2023-03-16 ENCOUNTER — Emergency Department (HOSPITAL_BASED_OUTPATIENT_CLINIC_OR_DEPARTMENT_OTHER): Payer: Medicare Other

## 2023-03-16 DIAGNOSIS — R410 Disorientation, unspecified: Secondary | ICD-10-CM | POA: Diagnosis not present

## 2023-03-16 DIAGNOSIS — Z7982 Long term (current) use of aspirin: Secondary | ICD-10-CM | POA: Insufficient documentation

## 2023-03-16 DIAGNOSIS — I6782 Cerebral ischemia: Secondary | ICD-10-CM | POA: Diagnosis not present

## 2023-03-16 DIAGNOSIS — I10 Essential (primary) hypertension: Secondary | ICD-10-CM | POA: Diagnosis present

## 2023-03-16 DIAGNOSIS — E669 Obesity, unspecified: Secondary | ICD-10-CM | POA: Insufficient documentation

## 2023-03-16 DIAGNOSIS — R519 Headache, unspecified: Secondary | ICD-10-CM | POA: Diagnosis not present

## 2023-03-16 DIAGNOSIS — I672 Cerebral atherosclerosis: Secondary | ICD-10-CM | POA: Diagnosis not present

## 2023-03-16 DIAGNOSIS — R001 Bradycardia, unspecified: Secondary | ICD-10-CM | POA: Diagnosis not present

## 2023-03-16 DIAGNOSIS — I6523 Occlusion and stenosis of bilateral carotid arteries: Secondary | ICD-10-CM | POA: Diagnosis not present

## 2023-03-16 DIAGNOSIS — R4182 Altered mental status, unspecified: Secondary | ICD-10-CM | POA: Insufficient documentation

## 2023-03-16 LAB — CBC WITH DIFFERENTIAL/PLATELET
Abs Immature Granulocytes: 0.04 10*3/uL (ref 0.00–0.07)
Basophils Absolute: 0 10*3/uL (ref 0.0–0.1)
Basophils Relative: 0 %
Eosinophils Absolute: 0.1 10*3/uL (ref 0.0–0.5)
Eosinophils Relative: 2 %
HCT: 37.1 % (ref 36.0–46.0)
Hemoglobin: 12 g/dL (ref 12.0–15.0)
Immature Granulocytes: 0 %
Lymphocytes Relative: 23 %
Lymphs Abs: 2.1 10*3/uL (ref 0.7–4.0)
MCH: 31.5 pg (ref 26.0–34.0)
MCHC: 32.3 g/dL (ref 30.0–36.0)
MCV: 97.4 fL (ref 80.0–100.0)
Monocytes Absolute: 1.2 10*3/uL — ABNORMAL HIGH (ref 0.1–1.0)
Monocytes Relative: 13 %
Neutro Abs: 5.7 10*3/uL (ref 1.7–7.7)
Neutrophils Relative %: 62 %
Platelets: 296 10*3/uL (ref 150–400)
RBC: 3.81 MIL/uL — ABNORMAL LOW (ref 3.87–5.11)
RDW: 15.6 % — ABNORMAL HIGH (ref 11.5–15.5)
WBC: 9.2 10*3/uL (ref 4.0–10.5)
nRBC: 0 % (ref 0.0–0.2)

## 2023-03-16 LAB — RAPID URINE DRUG SCREEN, HOSP PERFORMED
Amphetamines: NOT DETECTED
Barbiturates: NOT DETECTED
Benzodiazepines: NOT DETECTED
Cocaine: NOT DETECTED
Opiates: NOT DETECTED
Tetrahydrocannabinol: NOT DETECTED

## 2023-03-16 LAB — RESP PANEL BY RT-PCR (RSV, FLU A&B, COVID)  RVPGX2
Influenza A by PCR: NEGATIVE
Influenza B by PCR: NEGATIVE
Resp Syncytial Virus by PCR: NEGATIVE
SARS Coronavirus 2 by RT PCR: NEGATIVE

## 2023-03-16 LAB — URINALYSIS, ROUTINE W REFLEX MICROSCOPIC
Bilirubin Urine: NEGATIVE
Glucose, UA: NEGATIVE mg/dL
Hgb urine dipstick: NEGATIVE
Ketones, ur: NEGATIVE mg/dL
Nitrite: NEGATIVE
Protein, ur: NEGATIVE mg/dL
Specific Gravity, Urine: 1.015 (ref 1.005–1.030)
pH: 6 (ref 5.0–8.0)

## 2023-03-16 LAB — BASIC METABOLIC PANEL
Anion gap: 10 (ref 5–15)
BUN: 21 mg/dL (ref 8–23)
CO2: 26 mmol/L (ref 22–32)
Calcium: 9.3 mg/dL (ref 8.9–10.3)
Chloride: 102 mmol/L (ref 98–111)
Creatinine, Ser: 1.04 mg/dL — ABNORMAL HIGH (ref 0.44–1.00)
GFR, Estimated: 53 mL/min — ABNORMAL LOW (ref >60)
Glucose, Bld: 99 mg/dL (ref 70–99)
Potassium: 4 mmol/L (ref 3.5–5.1)
Sodium: 138 mmol/L (ref 135–145)

## 2023-03-16 LAB — ETHANOL: Alcohol, Ethyl (B): <10 mg/dL (ref <10)

## 2023-03-16 LAB — URINALYSIS, MICROSCOPIC (REFLEX)

## 2023-03-16 LAB — AMMONIA: Ammonia: 19 umol/L (ref 9–35)

## 2023-03-16 LAB — TROPONIN I (HIGH SENSITIVITY): Troponin I (High Sensitivity): 15 ng/L (ref <18)

## 2023-03-16 MED ORDER — GADOBUTROL 1 MMOL/ML IV SOLN
10.0000 mL | Freq: Once | INTRAVENOUS | Status: AC | PRN
  Administered 2023-03-16: 10 mL via INTRAVENOUS

## 2023-03-16 MED ORDER — IOHEXOL 350 MG/ML SOLN
75.0000 mL | Freq: Once | INTRAVENOUS | Status: AC | PRN
  Administered 2023-03-16: 75 mL via INTRAVENOUS

## 2023-03-16 MED ORDER — METHOCARBAMOL 500 MG PO TABS: 500.0000 mg | ORAL_TABLET | Freq: Three times a day (TID) | ORAL | 0 refills | Status: DC | PRN

## 2023-03-16 MED ORDER — PROCHLORPERAZINE MALEATE 10 MG PO TABS
10.0000 mg | ORAL_TABLET | Freq: Once | ORAL | Status: AC
  Administered 2023-03-16: 10 mg via ORAL
  Filled 2023-03-16: qty 1

## 2023-03-16 MED ORDER — KETOROLAC TROMETHAMINE 60 MG/2ML IM SOLN
30.0000 mg | Freq: Once | INTRAMUSCULAR | Status: AC
Start: 2023-03-16 — End: 2023-03-16
  Administered 2023-03-16: 30 mg via INTRAMUSCULAR
  Filled 2023-03-16: qty 2

## 2023-03-16 MED ORDER — METHOCARBAMOL 500 MG PO TABS
750.0000 mg | ORAL_TABLET | Freq: Once | ORAL | Status: AC
  Administered 2023-03-16: 750 mg via ORAL
  Filled 2023-03-16: qty 2

## 2023-03-16 NOTE — ED Notes (Signed)
 Pt has had 2 loose BM, 1 in the bed and 1 on bedside commode.  She has not voided yet however.

## 2023-03-16 NOTE — ED Triage Notes (Signed)
 Pt BIB [facility] for AMS with lethargy after being seen at [facility] last night.  On arrival pt answered orientation questions appropriately except for location but slowly and was someone what difficult to work with for set up on monitor.    Pt would not allow medic to obtain BP.  HR 60 94% RA, CBG 133

## 2023-03-16 NOTE — ED Provider Notes (Signed)
 [place] EMERGENCY DEPARTMENT AT [facility] Provider Note   CSN: [id number] Arrival date & time: 03/16/23  1505     History  No chief complaint on file.   Amanda Copeland is a 83 y.o. female presenting to the ED with confusion, AMS.  Patient seen last night at [facility] for headache symptoms, had unremarkable CT head, and was given migraine cocktail.  She presents back with EMS today with family concerns for worsening confusion.  Patient reporting she is small headache now, but not really there.  Denies chest pain, abdominal pain.  Says she just wants to go home.  I spoke to her husband at bedside as well as the patient's daughter.  They confirmed the patient typically does not have dementia or confusion, does not complain of headaches.  She began having a right-sided temporal headache about 2 weeks ago.  This was not persistent, would come and go at times.  The pain became much more severe yesterday evening, prompting her visit to the ED last night.  She was given Robaxin , Compazine , Toradol , discharged very late in the evening into the early morning home.  She overslept to 11 today.  After that she appeared very groggy, lethargic, seem to be sleeping in her chair most of the day, and then was repeatedly asking what when her husband was speaking to her.  He grew concerned and called EMS, and reports that she was confused or combative when EMS arrived.  Since arrival in the hospital, her husband and her daughter report that her mental status appears to be improving, although she is not completely back to baseline status.  They did note that she had foul-smelling urine or malodorous urine earlier today.  No prior history of recurring UTIs or significant UTIs.  The patient reports that her headache is currently mild to moderate intensity, returning in her right temporal region.  No report of blurred vision at this time.  She reportedly had a full ophthalmologic exam  performed yesterday after complaining of pain in both of my eyes.  Her husband says that she was prescribed an antibiotic eyedrop.   HPI     Home Medications Prior to Admission medications   Medication Sig Start Date End Date Taking? Authorizing Provider  albuterol  (VENTOLIN  HFA) 108 (90 Base) MCG/ACT inhaler Inhale 2 puffs into the lungs every 4 (four) hours as needed for wheezing or shortness of breath. Patient not taking: Reported on 12/14/2022 06/23/22   Hunsucker, Donnice SAUNDERS, MD  allopurinol  (ZYLOPRIM ) 100 MG tablet TAKE 2 TABLETS BY MOUTH EVERY DAY 09/28/22   Domenica Harlene LABOR, MD  AMBULATORY NON FORMULARY MEDICATION Rollator  Dx: Gait instability 09/18/19   Tat, Asberry RAMAN, DO  Ascorbic Acid (VITAMIN C PO) Take 1 tablet by mouth daily.     [provider]  aspirin  81 MG tablet Take 81 mg by mouth daily.    [provider]  Budeson-Glycopyrrol-Formoterol  (BREZTRI  AEROSPHERE) 160-9-4.8 MCG/ACT AERO Inhale 2 puffs into the lungs in the morning and at bedtime. 08/23/22   Hunsucker, Donnice SAUNDERS, MD  Budeson-Glycopyrrol-Formoterol  (BREZTRI  AEROSPHERE) 160-9-4.8 MCG/ACT AERO 2 puffs Inhalation Twice a day 09/21/22   [provider]  chlorpheniramine-HYDROcodone  (TUSSIONEX) 10-8 MG/5ML Take 5 mLs by mouth every 12 (twelve) hours as needed for cough. 06/14/22   Antonio Cyndee Rockers R, DO  colchicine  0.6 MG tablet Take 2 tabs at first and then 1 tab q 2 hours til pain relief or max of 6 tabs in  24 hours Patient taking differently: Take 2 tabs at first and then 1 tab q 2 hours til pain relief or max of 6 tabs in 24 hours - PRN for gout flare-up 03/30/20   Domenica Harlene LABOR, MD  DYMISTA  137-50 MCG/ACT SUSP 1-2 SPRAYS PER NOSTRIL TWICE DAILY FOR RUNNY NOSE 02/28/20   Kozlow, Camellia PARAS, MD  ezetimibe  (ZETIA ) 10 MG tablet TAKE 1 TABLET BY MOUTH EVERY DAY 10/03/22   Domenica Harlene LABOR, MD  famotidine  (PEPCID ) 40 MG tablet TAKE 1 TABLET BY MOUTH EVERYDAY AT BEDTIME 10/11/22   Domenica Harlene LABOR, MD   furosemide  (LASIX ) 40 MG tablet TAKE 1 TABLET BY MOUTH EVERY DAY 04/05/22   Dann Candyce RAMAN, MD  KLOR-CON  M20 20 MEQ tablet TAKE 1 TABLET BY MOUTH DAILY. KEEP UPCOMING APPOINTMENT 02/05/20   Dann Candyce RAMAN, MD  levothyroxine  (SYNTHROID ) 112 MCG tablet TAKE 1 TABLET BY MOUTH EVERY DAY BEFORE BREAKFAST 11/02/22   Domenica Harlene LABOR, MD  methocarbamol  (ROBAXIN ) 500 MG tablet Take 1-2 tablets (500-1,000 mg total) by mouth every 8 (eight) hours as needed for muscle spasms. 03/16/23   CardamaRaynell Moder, MD  Multiple Vitamins-Minerals (ICAPS AREDS 2) CAPS Take 1 capsule by mouth 2 (two) times daily.    [provider]  PREVACID  30 MG capsule TAKE 1 CAPSULE BY MOUTH TWICE A DAY --MAX LIMIT 04/01/21   Domenica Harlene LABOR, MD  propranolol  (INDERAL ) 40 MG tablet Take 1 tablet (40 mg total) by mouth 2 (two) times daily. 12/14/22   Tat, Asberry RAMAN, DO  Red Yeast Rice Extract (RED YEAST RICE PO) Take by mouth daily.    [provider]      Allergies    Amoxicillin, Ampicillin, Penicillins, and Statins    Review of Systems   Review of Systems  Physical Exam Updated Vital Signs There were no vitals taken for this visit. Physical Exam Constitutional:      General: She is not in acute distress.    Appearance: She is obese.  HENT:     Head: Normocephalic and atraumatic.  Eyes:     Conjunctiva/sclera: Conjunctivae normal.     Pupils: Pupils are equal, round, and reactive to light.  Cardiovascular:     Rate and Rhythm: Normal rate and regular rhythm.  Pulmonary:     Effort: Pulmonary effort is normal. No respiratory distress.  Abdominal:     General: There is no distension.     Tenderness: There is no abdominal tenderness.  Musculoskeletal:     Cervical back: Normal range of motion and neck supple. No rigidity.  Skin:    General: Skin is warm and dry.  Neurological:     Mental Status: She is alert.     Comments: Oriented only to self and place Moving all extremities to  command, speech is clear, no evident cranial nerve deficits on exam No temporal artery tenderness     ED Results / Procedures / Treatments   Labs (all labs ordered are listed, but only abnormal results are displayed) Labs Reviewed - No data to display  EKG None  Radiology CT Head Wo Contrast Result Date: 03/16/2023 CLINICAL DATA:  New onset headache EXAM: CT HEAD WITHOUT CONTRAST TECHNIQUE: Contiguous axial images were obtained from the base of the skull through the vertex without intravenous contrast. RADIATION DOSE REDUCTION: This exam was performed according to the departmental dose-optimization program which includes automated exposure control, adjustment of the mA and/or kV according to patient size and/or use of  iterative reconstruction technique. COMPARISON:  03/14/2017 FINDINGS: Brain: There is no mass, hemorrhage or extra-axial collection. There is generalized atrophy without lobar predilection. Hypodensity of the white matter is most commonly associated with chronic microvascular disease. Vascular: Atherosclerotic calcification of the vertebral and internal carotid arteries at the skull base. No abnormal hyperdensity of the major intracranial arteries or dural venous sinuses. Skull: The visualized skull base, calvarium and extracranial soft tissues are normal. Sinuses/Orbits: No fluid levels or advanced mucosal thickening of the visualized paranasal sinuses. No mastoid or middle ear effusion. Normal orbits. Other: None. IMPRESSION: 1. No acute intracranial abnormality. 2. Generalized atrophy and findings of chronic microvascular disease. Electronically Signed   By: Franky Stanford M.D.   On: 03/16/2023 00:57    Procedures Procedures    Medications Ordered in ED Medications - No data to display  ED Course/ Medical Decision Making/ A&P Clinical Course as of 03/17/23 0854  Thu Mar 16, 2023  2336 Pt reportedly still not at baseline according to family members.  Planning therefore for  likely medical admission for AMS.  No emergent findings for headache [MT]    Clinical Course User Index [MT] Tylee Yum, Donnice PARAS, MD                                 Medical Decision Making Amount and/or Complexity of Data Reviewed Labs: ordered. Radiology: ordered. ECG/medicine tests: ordered.  Risk Prescription drug management.   This patient presents to the ED with concern for AMS. This involves an extensive number of treatment options, and is a complaint that carries with it a high risk of complications and morbidity.  The differential diagnosis includes metabolic derangement vs infection vs polypharmacy vs drug toxidrome vs other  Consideration for headache would include venous thrombosis versus subacute infarct or stroke.  Lower suspicion for acute meningitis with no fever, no meningismus, do not believe she is requiring a lumbar puncture at this time.  Additional history obtained from EMS, family members,  External records from outside source obtained and reviewed including CT head yesterday, unremarkable, in ED.  Covid/flu testing negative.  Seen by neurology 12/14/22 noting hx of essential tremor and hx of gabapentin -induced myoclonus - no report of dementia or confusion issues  I ordered and personally interpreted labs.  The pertinent results include:  no emergent findings  I ordered imaging studies including MRI brain, MR venogram, CT venogram I independently visualized and interpreted imaging which showed no emergent findings (specifically no report of acute thrombosis on CT imaging; there was some motion degradation on MRI) I agree with the radiologist interpretation  The patient was maintained on a cardiac monitor.  I personally viewed and interpreted the cardiac monitored which showed an underlying rhythm of: NSR  Per my interpretation the patient's ECG shows NSR  I have reviewed the patients home medicines and have made adjustments as needed  Test Considered: doubt  meningitis, SAH, or acute demyelinating CNS disease; pt without fever, photophobia, nuccal rigidity, or leukocytosis.  No acute onset headache, and no persistent headache for 2 weeks (it has been waxing and waning). I did not feel she required an LP  Doubt acute angle glaucoma or intraocular injury or cause of headache.  Doubt temporal arteritis  After the interventions noted above, I reevaluated the patient and found that they have: improved.  The patient's mental status had improved throughout her stay in the ED.  Her initial somnolence had resolved.  However, her daughter and husband felt she was still not back to baseline.  We discussed the possibility that this may have been lingering effects of the IV medications given for her headache yesterday evening, but it has now been 22 hours since those medications were given, well beyond their half-life. Therefore the hospitalist, Dr. Laurence, was consulted for admission for AMS.         Final Clinical Impression(s) / ED Diagnoses Final diagnoses:  None    Rx / DC Orders ED Discharge Orders     None         Sayer Masini, Donnice PARAS, MD 03/17/23 [phone]

## 2023-03-16 NOTE — ED Provider Notes (Signed)
 [place] EMERGENCY DEPARTMENT AT [facility] Provider Note  CSN: [id number] Arrival date & time: 03/15/23 2030  Chief Complaint(s) Headache  HPI Amanda Copeland is a 83 y.o. female     Headache Pain location:  Generalized Quality:  Dull and sharp Radiates to:  Does not radiate Onset quality:  Gradual Duration:  3 days Timing: initially intermittent, now constant. Chronicity:  New Relieved by:  Nothing Worsened by:  Nothing Associated symptoms: ear pain, eye pain, nausea and neck pain (chronic)   Associated symptoms: no blurred vision, no cough, no fever, no paresthesias, no photophobia, no sore throat, no visual change and no vomiting   Associated symptoms comment:  Rhinorrhea +    Past Medical History Past Medical History:  Diagnosis Date   Arthritis    Asthma    childhood now returning   Back pain affecting pregnancy 11/02/2014   Benign essential tremor 10/28/2014   Bilateral dry eyes    Breast cancer (HCC)    right   Cancer (HCC)    Breast   Dry eyes    Encounter for Medicare annual wellness exam 05/22/2016   Essential hypertension, benign 10/28/2014   H/O measles    H/O mumps    History of chicken pox    Lumbago 11/02/2014   Mixed hyperlipidemia 10/28/2014   Obesity 10/28/2014   Stenosis of cervical spine    Patient Active Problem List   Diagnosis Date Noted   Chronic bronchitis (HCC) 06/14/2022   Osteopenia 05/25/2021   Scapular dysfunction 03/17/2021   Compression fracture of T11 vertebra (HCC) 01/20/2021   SI joint arthritis (HCC) 01/20/2021   Vitamin D  deficiency 01/20/2021   Age-related osteoporosis with current pathological fracture 11/26/2020   Pain of right upper extremity 11/11/2020   Bradycardia 11/11/2020   Neck pain 03/11/2020   Tremor 03/01/2020   Hypothyroid 03/01/2020   Physical deconditioning 11/25/2019   At risk for obstructive sleep apnea 11/25/2019   Primary osteoarthritis of right shoulder 02/28/2019   Other forms of  dyspnea 11/22/2018   Educated about COVID-19 virus infection 07/03/2018   Greater trochanteric pain syndrome of right lower extremity 03/18/2018   Chronic nonallergic rhinitis 12/20/2017   Cough 12/20/2017   Right flank pain 12/10/2017   Fatigue 07/12/2017   Morbid obesity (HCC) 03/05/2017   Chronic diastolic heart failure (HCC) 11/29/2016   Dyspnea on exertion 10/06/2016   Asthma 10/06/2016   Preventative health care 05/22/2016   Hyperglycemia 08/07/2015   Allergic 08/07/2015   Gout 08/07/2015   Leukocytosis 08/07/2015   Lower abdominal pain 04/21/2015   Low back pain 11/02/2014   Esophageal reflux 10/28/2014   Essential hypertension, benign 10/28/2014   Hyperlipidemia 10/28/2014   Benign essential tremor 10/28/2014   Bilateral dry eyes    Moderate persistent asthma    Arthritis    Cancer (HCC)    History of chicken pox    Home Medication(s) Prior to Admission medications   Medication Sig Start Date End Date Taking? Authorizing Provider  methocarbamol  (ROBAXIN ) 500 MG tablet Take 1-2 tablets (500-1,000 mg total) by mouth every 8 (eight) hours as needed for muscle spasms. 03/16/23  Yes Hussein Macdougal, Raynell Moder, MD  albuterol  (VENTOLIN  HFA) 108 (90 Base) MCG/ACT inhaler Inhale 2 puffs into the lungs every 4 (four) hours as needed for wheezing or shortness of breath. Patient not taking: Reported on 12/14/2022 06/23/22   Hunsucker, Donnice SAUNDERS, MD  allopurinol  (ZYLOPRIM ) 100 MG tablet TAKE 2 TABLETS BY MOUTH EVERY DAY 09/28/22  Domenica Harlene LABOR, MD  AMBULATORY NON FORMULARY MEDICATION Rollator  Dx: Gait instability 09/18/19   Tat, Asberry RAMAN, DO  Ascorbic Acid (VITAMIN C PO) Take 1 tablet by mouth daily.     [provider]  aspirin  81 MG tablet Take 81 mg by mouth daily.    [provider]  Budeson-Glycopyrrol-Formoterol  (BREZTRI  AEROSPHERE) 160-9-4.8 MCG/ACT AERO Inhale 2 puffs into the lungs in the morning and at bedtime. 08/23/22   Hunsucker, Donnice SAUNDERS, MD   Budeson-Glycopyrrol-Formoterol  (BREZTRI  AEROSPHERE) 160-9-4.8 MCG/ACT AERO 2 puffs Inhalation Twice a day 09/21/22   [provider]  chlorpheniramine-HYDROcodone  (TUSSIONEX) 10-8 MG/5ML Take 5 mLs by mouth every 12 (twelve) hours as needed for cough. 06/14/22   Antonio Cyndee Jamee SAUNDERS, DO  colchicine  0.6 MG tablet Take 2 tabs at first and then 1 tab q 2 hours til pain relief or max of 6 tabs in 24 hours Patient taking differently: Take 2 tabs at first and then 1 tab q 2 hours til pain relief or max of 6 tabs in 24 hours - PRN for gout flare-up 03/30/20   Domenica Harlene LABOR, MD  DYMISTA  137-50 MCG/ACT SUSP 1-2 SPRAYS PER NOSTRIL TWICE DAILY FOR RUNNY NOSE 02/28/20   Kozlow, Camellia PARAS, MD  ezetimibe  (ZETIA ) 10 MG tablet TAKE 1 TABLET BY MOUTH EVERY DAY 10/03/22   Domenica Harlene LABOR, MD  famotidine  (PEPCID ) 40 MG tablet TAKE 1 TABLET BY MOUTH EVERYDAY AT BEDTIME 10/11/22   Domenica Harlene LABOR, MD  furosemide  (LASIX ) 40 MG tablet TAKE 1 TABLET BY MOUTH EVERY DAY 04/05/22   Dann Candyce RAMAN, MD  KLOR-CON  M20 20 MEQ tablet TAKE 1 TABLET BY MOUTH DAILY. KEEP UPCOMING APPOINTMENT 02/05/20   Dann Candyce RAMAN, MD  levothyroxine  (SYNTHROID ) 112 MCG tablet TAKE 1 TABLET BY MOUTH EVERY DAY BEFORE BREAKFAST 11/02/22   Domenica Harlene LABOR, MD  Multiple Vitamins-Minerals (ICAPS AREDS 2) CAPS Take 1 capsule by mouth 2 (two) times daily.    [provider]  PREVACID  30 MG capsule TAKE 1 CAPSULE BY MOUTH TWICE A DAY --MAX LIMIT 04/01/21   Domenica Harlene LABOR, MD  propranolol  (INDERAL ) 40 MG tablet Take 1 tablet (40 mg total) by mouth 2 (two) times daily. 12/14/22   Tat, Asberry RAMAN, DO  Red Yeast Rice Extract (RED YEAST RICE PO) Take by mouth daily.    [provider]                                                                                                                                    Allergies Amoxicillin, Ampicillin, Penicillins, and Statins  Review of Systems Review of Systems  Constitutional:   Negative for fever.  HENT:  Positive for ear pain. Negative for sore throat.   Eyes:  Positive for pain. Negative for blurred vision and photophobia.  Respiratory:  Negative for cough.   Gastrointestinal:  Positive for nausea. Negative for vomiting.  Musculoskeletal:  Positive for neck pain (chronic).  Neurological:  Positive for headaches. Negative for paresthesias.   As noted in HPI  Physical Exam Vital Signs  I have reviewed the triage vital signs BP (!) 140/93 (BP Location: Right Arm)   Pulse (!) 56   Temp 98 F (36.7 C) (Oral)   Resp 16   Ht 5' (1.524 m)   Wt 99.8 kg   SpO2 94%   BMI 42.97 kg/m   Physical Exam Vitals reviewed.  Constitutional:      General: She is not in acute distress.    Appearance: She is well-developed. She is not diaphoretic.  HENT:     Head: Normocephalic and atraumatic.     Comments: No tender palpable cord over temples    Right Ear: Tympanic membrane and external ear normal.     Left Ear: Tympanic membrane and external ear normal.     Nose: Rhinorrhea present.     Mouth/Throat:     Pharynx: Oropharynx is clear.  Eyes:     General: No scleral icterus.    Conjunctiva/sclera: Conjunctivae normal.  Neck:     Trachea: Phonation normal.   Cardiovascular:     Rate and Rhythm: Normal rate and regular rhythm.  Pulmonary:     Effort: Pulmonary effort is normal. No respiratory distress.     Breath sounds: No stridor.  Abdominal:     General: There is no distension.  Musculoskeletal:        General: Normal range of motion.     Cervical back: Normal range of motion. Muscular tenderness present. No spinous process tenderness.  Neurological:     Mental Status: She is alert and oriented to person, place, and time.  Psychiatric:        Behavior: Behavior normal.     ED Results and Treatments Labs (all labs ordered are listed, but only abnormal results are displayed) Labs Reviewed  RESP PANEL BY RT-PCR (RSV, FLU A&B, COVID)  [id number]                                                                                                                          EKG  EKG Interpretation Date/Time:    Ventricular Rate:    PR Interval:    QRS Duration:    QT Interval:    QTC Calculation:   R Axis:      Text Interpretation:         Radiology CT Head Wo Contrast Result Date: 03/16/2023 CLINICAL DATA:  New onset headache EXAM: CT HEAD WITHOUT CONTRAST TECHNIQUE: Contiguous axial images were obtained from the base of the skull through the vertex without intravenous contrast. RADIATION DOSE REDUCTION: This exam was performed according to the departmental dose-optimization program which includes automated exposure control, adjustment of the mA and/or kV according to patient size and/or use of iterative reconstruction technique. COMPARISON:  03/14/2017 FINDINGS: Brain: There is no mass, hemorrhage or extra-axial collection. There is generalized atrophy without  lobar predilection. Hypodensity of the white matter is most commonly associated with chronic microvascular disease. Vascular: Atherosclerotic calcification of the vertebral and internal carotid arteries at the skull base. No abnormal hyperdensity of the major intracranial arteries or dural venous sinuses. Skull: The visualized skull base, calvarium and extracranial soft tissues are normal. Sinuses/Orbits: No fluid levels or advanced mucosal thickening of the visualized paranasal sinuses. No mastoid or middle ear effusion. Normal orbits. Other: None. IMPRESSION: 1. No acute intracranial abnormality. 2. Generalized atrophy and findings of chronic microvascular disease. Electronically Signed   By: Franky Stanford M.D.   On: 03/16/2023 00:57    Medications Ordered in ED Medications  acetaminophen  (TYLENOL ) tablet 1,000 mg (1,000 mg Oral Given 03/16/23 0006)  ketorolac  (TORADOL ) injection 30 mg (30 mg Intramuscular Given 03/16/23 0205)  prochlorperazine  (COMPAZINE ) tablet 10 mg (10 mg Oral Given 03/16/23  0204)  methocarbamol  (ROBAXIN ) tablet 750 mg (750 mg Oral Given 03/16/23 0204)   Procedures Procedures  (including critical care time) Medical Decision Making / ED Course   Medical Decision Making Amount and/or Complexity of Data Reviewed Radiology: ordered.  Risk OTC drugs. Prescription drug management.    Headache Differential diagnosis and workup considered. Non focal neuro exam.  No fever. Doubt meningitis.  Doubt IIH. No recent head trauma. Doubt intracranial bleed.   CT head negative for mass effect. Exam not suspicious for temporal arteritis. Favoring muscular tension headache.     Final Clinical Impression(s) / ED Diagnoses Final diagnoses:  Bad headache   The patient appears reasonably screened and/or stabilized for discharge and I doubt any other medical condition or other [facility] requiring further screening, evaluation, or treatment in the ED at this time. I have discussed the findings, Dx and Tx plan with the patient/family who expressed understanding and agree(s) with the plan. Discharge instructions discussed at length. The patient/family was given strict return precautions who verbalized understanding of the instructions. No further questions at time of discharge.  Disposition: Discharge  Condition: Good  ED Discharge Orders          Ordered    methocarbamol  (ROBAXIN ) 500 MG tablet  Every 8 hours PRN        03/16/23 0159              Follow Up: Domenica Harlene LABOR, MD [address] [place] [address] [phone]  Call  to schedule an appointment for close follow up    This chart was dictated using voice recognition software.  Despite best efforts to proofread,  errors can occur which can change the documentation meaning.    Trine Raynell Moder, MD 03/16/23 [phone]

## 2023-03-16 NOTE — ED Notes (Signed)
 ED Provider at bedside.

## 2023-03-16 NOTE — ED Notes (Signed)
 Just assumed care of patient. Patient just returned from MRI. Patient placed back on full cardiac monitor with pulse oc. Patient stood up with Ronnald to use the bed side commode. Patient placed back in bed and slide up in the bed. Patient is in no noted distress at the present time will continue to monitor for any changes. Patient is a@O  times 4.

## 2023-03-17 DIAGNOSIS — R404 Transient alteration of awareness: Secondary | ICD-10-CM

## 2023-03-17 DIAGNOSIS — I10 Essential (primary) hypertension: Secondary | ICD-10-CM

## 2023-03-17 DIAGNOSIS — R4182 Altered mental status, unspecified: Secondary | ICD-10-CM

## 2023-03-17 DIAGNOSIS — R519 Headache, unspecified: Secondary | ICD-10-CM | POA: Diagnosis not present

## 2023-03-17 MED ORDER — EZETIMIBE 10 MG PO TABS: 10.0000 mg | ORAL_TABLET | Freq: Every day | ORAL | Status: DC

## 2023-03-17 MED ORDER — PROPRANOLOL HCL 10 MG PO TABS: 40.0000 mg | ORAL_TABLET | Freq: Two times a day (BID) | ORAL | Status: DC

## 2023-03-17 MED ORDER — FAMOTIDINE 20 MG PO TABS: 20.0000 mg | ORAL_TABLET | Freq: Every day | ORAL | Status: DC

## 2023-03-17 MED ORDER — BUDESON-GLYCOPYRROL-FORMOTEROL 160-9-4.8 MCG/ACT IN AERO: 2.0000 | INHALATION_SPRAY | Freq: Two times a day (BID) | RESPIRATORY_TRACT | Status: DC

## 2023-03-17 MED ORDER — LEVOTHYROXINE SODIUM 112 MCG PO TABS
112.0000 ug | ORAL_TABLET | Freq: Every day | ORAL | Status: DC
  Administered 2023-03-17: 112 ug via ORAL
  Filled 2023-03-17: qty 1

## 2023-03-17 MED ORDER — ASPIRIN 81 MG PO TBEC: 81.0000 mg | DELAYED_RELEASE_TABLET | Freq: Every day | ORAL | Status: DC

## 2023-03-17 NOTE — ED Provider Notes (Signed)
 Care assumed at 2330.  Plan to admit for altered mental status.  Patient was assessed by admitting physician, found to be at her baseline mental status.  On assessment patient reports she feels back to normal.  No current complaints.  Suspect this may have been secondary to medications for her headache.  Plan to discharge with outpatient follow-up and return precautions.  Patient's husband updated over the phone.   Griselda Norris, MD 03/17/23 0330

## 2023-03-17 NOTE — Discharge Instructions (Signed)
 Your symptoms of confusion were likely due to some of the medications you received for your headache in the Emergency Department.  Do not take the methocarbamol  (robaxin ), as this likely caused your confusion.

## 2023-03-17 NOTE — Assessment & Plan Note (Signed)
 Patient is on Inderal  40 mg twice daily along with Lasix  40 mg daily.  Patient should follow-up with her PCP given her bradycardia with heart rates in the mid 50s.  Her beta-blocker could be changed out for another antihypertensive.  Defer to her PCP to make this adjustment.

## 2023-03-17 NOTE — Consult Note (Signed)
 [facility] Consultation    [place] Record FMW:[id number] DOB: 09-Jun-1940 DOA: 03/16/2023  DOS: the patient was seen and examined on 03/16/2023  PCP: Domenica Harlene LABOR, MD   Patient coming from: Home  I have personally briefly reviewed patient's old medical records in [facility]  HPI: 83 year old Caucasian female with a history of morbid obesity, hypertension, asthma, reflux, tremor who presents to the ER with altered mental status.  She was seen last night at [facility] for a right frontal headache.  She was given a migraine cocktail consisting of IV Compazine , p.o. Robaxin  and IV Toradol .  She was discharged to home.  This was about 2:00 in the morning on 03/16/2023 when she finally left the ER.  Patient states that she went home and went straight to bed.  She got up late that morning.  She ate breakfast.  She sat in a chair.  She remembers being confused.  Her husband called EMS.  Patient recalls she was being combative and screaming and yelling for the EMS drivers.  She does not know why she was doing it.  She was brought to [facility].  She had an extensive workup including head CT, MRI of the brain, MRV of the head and a CT venogram.  These were all negative.  Her lab workup was also unremarkable.  Over the course of her ER stay, her mentation slowly improved.  [facility] was consulted.  By the time I saw her approximately 1:00 in the morning on 03/17/2023, her family had already left.  She is now alert and oriented x 4.  She knows that it is February 2025.  She knows the president.  She recalls being combative for EMS.  She does not know why.  Patient states that her right sided headache is only minor now.  She states that she has not had anything to eat or drink the last 24 hours.  I asked the nurse to her ginger ale and a sandwich.  I find no reason to medically admitted to the hospital.  Significant Labs: Unremarkable CBC.  White count 9.2,  hemoglobin 12.0, platelet 296 Sodium 138, potassium 4.0, chloride 102, bicarb 26, BUN of 21, creatinine 1.0 Ammonia 19 Alcohol less than 10 UA only trace leukocytes UDS negative  Significant Imaging Studies: CT head showed no acute intracranial abnormality MRI brain/MRV head showed no acute intracranial abnormality.  Chronic small vessel ischemic disease. CT venogram negative for dural sinus thrombosis  Antibiotic Therapy: Anti-infectives (From admission, onward)    None       Procedures:   Consultants:    ED Course: Patient is CT head, MRI brain, MRV head, CT venogram which were all negative for acute intracranial abnormality.  Blood work was unremarkable.  Over the course of her ER visit, her mentation returned back to normal.  Review of Systems:  Review of Systems  Constitutional: Negative.   HENT: Negative.    Eyes: Negative.   Respiratory: Negative.    Cardiovascular: Negative.   Gastrointestinal: Negative.   Genitourinary: Negative.   Musculoskeletal: Negative.   Skin: Negative.   Neurological:  Positive for headaches.       Patient admits to confusion when EMS arrived.  This has resolved by the time she was seen in consultation.  Right-sided headache has improved.  Only described as mild.  Endo/Heme/Allergies: Negative.   Psychiatric/Behavioral: Negative.    All other systems reviewed and are negative.   Past Medical History:  Diagnosis  Date   Arthritis    Asthma    childhood now returning   Back pain affecting pregnancy 11/02/2014   Benign essential tremor 10/28/2014   Bilateral dry eyes    Breast cancer (HCC)    right   Cancer (HCC)    Breast   Dry eyes    Encounter for Medicare annual wellness exam 05/22/2016   Essential hypertension, benign 10/28/2014   H/O measles    H/O mumps    History of chicken pox    Lumbago 11/02/2014   Mixed hyperlipidemia 10/28/2014   Obesity 10/28/2014   Stenosis of cervical spine     Past Surgical History:   Procedure Laterality Date   BACK SURGERY     BREAST REDUCTION SURGERY Left    CHOLECYSTECTOMY     EYE SURGERY  2008   b/l cataracts removed, in [place]   MASTECTOMY Right    REDUCTION MAMMAPLASTY     REPLACEMENT TOTAL KNEE BILATERAL Bilateral    ROTATOR CUFF REPAIR Left      reports that she has never smoked. She has never used smokeless tobacco. She reports current alcohol use. She reports that she does not use drugs.  Allergies  Allergen Reactions   Amoxicillin Rash   Ampicillin Rash   Penicillins Rash   Statins Other (See Comments)    Per reports muscle aches and joint pain Per reports muscle aches and joint pain    Family History  Problem Relation Age of Onset   Heart disease Mother    Heart attack Mother    Heart failure Mother    Hypertension Mother    Cancer Father        colon   Asthma Father    Parkinson's disease Father    Cancer Maternal Grandmother        uterine   Heart disease Maternal Grandfather    Obesity Daughter    Appendicitis Paternal Grandmother    Stroke Neg Hx     Prior to Admission medications   Medication Sig Start Date End Date Taking? Authorizing Provider  doxycycline  (MONODOX ) 50 MG capsule Take 50 mg by mouth 2 (two) times daily. 03/15/23  Yes [provider]  albuterol  (VENTOLIN  HFA) 108 (90 Base) MCG/ACT inhaler Inhale 2 puffs into the lungs every 4 (four) hours as needed for wheezing or shortness of breath. Patient not taking: Reported on 12/14/2022 06/23/22   Hunsucker, Donnice SAUNDERS, MD  allopurinol  (ZYLOPRIM ) 100 MG tablet TAKE 2 TABLETS BY MOUTH EVERY DAY 09/28/22   Domenica Harlene LABOR, MD  AMBULATORY NON FORMULARY MEDICATION Rollator  Dx: Gait instability 09/18/19   Tat, Asberry RAMAN, DO  Ascorbic Acid (VITAMIN C PO) Take 1 tablet by mouth daily.     [provider]  aspirin  81 MG tablet Take 81 mg by mouth daily.    [provider]  Azelastine  HCl 137 MCG/SPRAY SOLN Place 1-2 puffs into both nostrils in the  morning and at bedtime.    [provider]  Budeson-Glycopyrrol-Formoterol  (BREZTRI  AEROSPHERE) 160-9-4.8 MCG/ACT AERO Inhale 2 puffs into the lungs in the morning and at bedtime. 08/23/22   Hunsucker, Donnice SAUNDERS, MD  Budeson-Glycopyrrol-Formoterol  (BREZTRI  AEROSPHERE) 160-9-4.8 MCG/ACT AERO 2 puffs Inhalation Twice a day 09/21/22   [provider]  chlorpheniramine-HYDROcodone  (TUSSIONEX) 10-8 MG/5ML Take 5 mLs by mouth every 12 (twelve) hours as needed for cough. 06/14/22   Antonio Cyndee Rockers R, DO  colchicine  0.6 MG tablet Take 2 tabs at first and then 1  tab q 2 hours til pain relief or max of 6 tabs in 24 hours Patient taking differently: Take 2 tabs at first and then 1 tab q 2 hours til pain relief or max of 6 tabs in 24 hours - PRN for gout flare-up 03/30/20   Domenica Harlene LABOR, MD  DYMISTA  137-50 MCG/ACT SUSP 1-2 SPRAYS PER NOSTRIL TWICE DAILY FOR RUNNY NOSE 02/28/20   Kozlow, Camellia PARAS, MD  ezetimibe  (ZETIA ) 10 MG tablet TAKE 1 TABLET BY MOUTH EVERY DAY 10/03/22   Domenica Harlene LABOR, MD  famotidine  (PEPCID ) 40 MG tablet TAKE 1 TABLET BY MOUTH EVERYDAY AT BEDTIME 10/11/22   Domenica Harlene LABOR, MD  fluticasone  (FLONASE ) 50 MCG/ACT nasal spray Place 1 spray into both nostrils.    [provider]  furosemide  (LASIX ) 40 MG tablet TAKE 1 TABLET BY MOUTH EVERY DAY 04/05/22   Dann Candyce RAMAN, MD  KLOR-CON  M20 20 MEQ tablet TAKE 1 TABLET BY MOUTH DAILY. KEEP UPCOMING APPOINTMENT 02/05/20   Dann Candyce RAMAN, MD  levothyroxine  (SYNTHROID ) 112 MCG tablet TAKE 1 TABLET BY MOUTH EVERY DAY BEFORE BREAKFAST 11/02/22   Domenica Harlene LABOR, MD  methocarbamol  (ROBAXIN ) 500 MG tablet Take 1-2 tablets (500-1,000 mg total) by mouth every 8 (eight) hours as needed for muscle spasms. 03/16/23   CardamaRaynell Moder, MD  Multiple Vitamins-Minerals (ICAPS AREDS 2) CAPS Take 1 capsule by mouth 2 (two) times daily.    [provider]  PREVACID  30 MG capsule TAKE 1 CAPSULE BY MOUTH TWICE A DAY --MAX  LIMIT 04/01/21   Domenica Harlene LABOR, MD  propranolol  (INDERAL ) 40 MG tablet Take 1 tablet (40 mg total) by mouth 2 (two) times daily. 12/14/22   Tat, Asberry RAMAN, DO  Red Yeast Rice Extract (RED YEAST RICE PO) Take by mouth daily.    [provider]    Physical Exam: Vitals:   03/16/23 2330 03/16/23 2345 03/17/23 0000 03/17/23 0120  BP:  (!) 191/79  (!) 156/83  Pulse: (!) 53 64 (!) 51 (!) 57  Resp: 14 20 (!) 27 16  Temp:    97.6 F (36.4 C)  TempSrc:    Oral  SpO2: 95% 97% 92% 98%    Physical Exam Vitals and nursing note reviewed.  Constitutional:      General: She is not in acute distress.    Appearance: She is obese. She is not toxic-appearing or diaphoretic.  HENT:     Head: Normocephalic and atraumatic.     Nose: Nose normal.  Eyes:     General: No scleral icterus. Cardiovascular:     Rate and Rhythm: Regular rhythm. Bradycardia present.  Pulmonary:     Effort: Pulmonary effort is normal. No respiratory distress.     Breath sounds: Normal breath sounds. No wheezing.  Abdominal:     General: Bowel sounds are normal. There is no distension.     Palpations: Abdomen is soft.     Tenderness: There is no abdominal tenderness.  Musculoskeletal:     Right lower leg: No edema.     Left lower leg: No edema.  Skin:    General: Skin is warm and dry.     Capillary Refill: Capillary refill takes less than 2 seconds.  Neurological:     General: No focal deficit present.     Mental Status: She is alert and oriented to person, place, and time.     Labs on Admission: I have personally reviewed following labs and imaging studies  CBC: Recent Labs  Lab 03/16/23 1543  WBC 9.2  NEUTROABS 5.7  HGB 12.0  HCT 37.1  MCV 97.4  PLT 296   Basic Metabolic Panel: Recent Labs  Lab 03/16/23 1543  NA 138  K 4.0  CL 102  CO2 26  GLUCOSE 99  BUN 21  CREATININE 1.04*  CALCIUM 9.3   GFR: Estimated Creatinine Clearance: 43.5 mL/min (A) (by C-G formula based on SCr of 1.04  mg/dL (H)).  Recent Labs  Lab 03/16/23 1543  AMMONIA 19   Cardiac Enzymes: Recent Labs  Lab 03/16/23 1543  TROPONINIHS 15   Urine analysis:    Component Value Date/Time   COLORURINE YELLOW 03/16/2023 1932   APPEARANCEUR CLEAR 03/16/2023 1932   LABSPEC 1.015 03/16/2023 1932   PHURINE 6.0 03/16/2023 1932   GLUCOSEU NEGATIVE 03/16/2023 1932   GLUCOSEU NEGATIVE 12/07/2017 1209   HGBUR NEGATIVE 03/16/2023 1932   BILIRUBINUR NEGATIVE 03/16/2023 1932   KETONESUR NEGATIVE 03/16/2023 1932   PROTEINUR NEGATIVE 03/16/2023 1932   UROBILINOGEN 0.2 12/07/2017 1209   NITRITE NEGATIVE 03/16/2023 1932   LEUKOCYTESUR TRACE (A) 03/16/2023 1932    Radiological Exams on Admission: I have personally reviewed images MR BRAIN WO CONTRAST Result Date: 03/16/2023 CLINICAL DATA:  Altered mental status EXAM: MRI HEAD WITHOUT CONTRAST MRV HEAD WITHOUT CONTRAST TECHNIQUE: Multiplanar, multi-echo pulse sequences of the brain and surrounding structures were acquired without intravenous contrast. Angiographic images of the intracranial venous structures were acquired using MRV technique without intravenous contrast. COMPARISON:  None Available. FINDINGS: MRI HEAD WITHOUT CONTRAST Brain: No acute infarct, mass effect or extra-axial collection. No acute or chronic hemorrhage. There is multifocal hyperintense T2-weighted signal within the white matter. Generalized volume loss. The midline structures are normal. Vascular: Normal flow voids. Skull and upper cervical spine: Normal calvarium and skull base. Visualized upper cervical spine and soft tissues are normal. Sinuses/Orbits:No paranasal sinus fluid levels or advanced mucosal thickening. No mastoid or middle ear effusion. Normal orbits. Ocular lens replacements. MR VENOGRAM WITHOUT CONTRAST Time-of-flight imaging of the venous sinuses shows narrowing of the central aspect of the left transverse sinus. Postcontrast images show possible filling defect within the  straight sinus and medial transverse sinuses, but the images are severely motion degraded. IMPRESSION: 1. No acute intracranial abnormality. 2. Findings of chronic small vessel ischemia and volume loss. 3. Severely motion degraded MRV. 4. Possible filling defect within the straight sinus and medial transverse sinuses, which could indicate venous sinus thrombosis. CT venogram is recommended. Electronically Signed   By: Franky Stanford M.D.   On: 03/16/2023 19:46   MR Venogram Head Result Date: 03/16/2023 CLINICAL DATA:  Altered mental status EXAM: MRI HEAD WITHOUT CONTRAST MRV HEAD WITHOUT CONTRAST TECHNIQUE: Multiplanar, multi-echo pulse sequences of the brain and surrounding structures were acquired without intravenous contrast. Angiographic images of the intracranial venous structures were acquired using MRV technique without intravenous contrast. COMPARISON:  None Available. FINDINGS: MRI HEAD WITHOUT CONTRAST Brain: No acute infarct, mass effect or extra-axial collection. No acute or chronic hemorrhage. There is multifocal hyperintense T2-weighted signal within the white matter. Generalized volume loss. The midline structures are normal. Vascular: Normal flow voids. Skull and upper cervical spine: Normal calvarium and skull base. Visualized upper cervical spine and soft tissues are normal. Sinuses/Orbits:No paranasal sinus fluid levels or advanced mucosal thickening. No mastoid or middle ear effusion. Normal orbits. Ocular lens replacements. MR VENOGRAM WITHOUT CONTRAST Time-of-flight imaging of the venous sinuses shows narrowing of the central aspect of the left transverse sinus.  Postcontrast images show possible filling defect within the straight sinus and medial transverse sinuses, but the images are severely motion degraded. IMPRESSION: 1. No acute intracranial abnormality. 2. Findings of chronic small vessel ischemia and volume loss. 3. Severely motion degraded MRV. 4. Possible filling defect within the  straight sinus and medial transverse sinuses, which could indicate venous sinus thrombosis. CT venogram is recommended. Electronically Signed   By: Franky Stanford M.D.   On: 03/16/2023 19:46   CT Head Wo Contrast Result Date: 03/16/2023 CLINICAL DATA:  New onset headache EXAM: CT HEAD WITHOUT CONTRAST TECHNIQUE: Contiguous axial images were obtained from the base of the skull through the vertex without intravenous contrast. RADIATION DOSE REDUCTION: This exam was performed according to the departmental dose-optimization program which includes automated exposure control, adjustment of the mA and/or kV according to patient size and/or use of iterative reconstruction technique. COMPARISON:  03/14/2017 FINDINGS: Brain: There is no mass, hemorrhage or extra-axial collection. There is generalized atrophy without lobar predilection. Hypodensity of the white matter is most commonly associated with chronic microvascular disease. Vascular: Atherosclerotic calcification of the vertebral and internal carotid arteries at the skull base. No abnormal hyperdensity of the major intracranial arteries or dural venous sinuses. Skull: The visualized skull base, calvarium and extracranial soft tissues are normal. Sinuses/Orbits: No fluid levels or advanced mucosal thickening of the visualized paranasal sinuses. No mastoid or middle ear effusion. Normal orbits. Other: None. IMPRESSION: 1. No acute intracranial abnormality. 2. Generalized atrophy and findings of chronic microvascular disease. Electronically Signed   By: Franky Stanford M.D.   On: 03/16/2023 00:57    EKG: My personal interpretation of EKG shows: NSR    Assessment/Plan Principal Problem:   Altered mental state Active Problems:   Essential hypertension, benign   Obesity, Class III, BMI 40-49.9 (morbid obesity) (HCC)    Assessment and Plan: * Altered mental state Unclear the exact etiology of her altered mental status.  Could have been from her Compazine  or  Robaxin  that was given to her yesterday in the ER at [facility] for her headache.  It also appears that she had a back injection for back pain with methylprednisolone  on 03/13/2023.  I doubt steroids to be the cause of her altered mental status that she has had back injections before with methylprednisolone .  Most likely offenders are the Compazine  or Robaxin .  She does not describe any dystonia but at 83 years old, I think Compazine  is most likely the offender.  I do not find an acute reason to admit her to the hospital.  I think she can eat and drink.  Family has ready left the ER.  Discussed this with the ER provder.  She is stable from my standpoint to go home.  Obesity, Class III, BMI 40-49.9 (morbid obesity) (HCC) BMI 42.97  Essential hypertension, benign Patient is on Inderal  40 mg twice daily along with Lasix  40 mg daily.  Patient should follow-up with her PCP given her bradycardia with heart rates in the mid 50s.  Her beta-blocker could be changed out for another antihypertensive.  Defer to her PCP to make this adjustment.   Disposition Plan: Does not require hospital admission. Recommend discharge to home. Discussed with EDP Dr. Griselda.  Camellia Door, DO [facility] 03/17/2023, 1:33 AM

## 2023-03-17 NOTE — Assessment & Plan Note (Signed)
BMI 42.97

## 2023-03-17 NOTE — Hospital Course (Signed)
 HPI: 83 year old Caucasian female with a history of morbid obesity, hypertension, asthma, reflux, tremor who presents to the ER with altered mental status.  She was seen last night at [facility] for a right frontal headache.  She was given a migraine cocktail consisting of IV Compazine , p.o. Robaxin  and IV Toradol .  She was discharged to home.  This was about 2:00 in the morning on 03/16/2023 when she finally left the ER.  Patient states that she went home and went straight to bed.  She got up late that morning.  She ate breakfast.  She sat in a chair.  She remembers being confused.  Her husband called EMS.  Patient recalls she was being combative and screaming and yelling for the EMS drivers.  She does not know why she was doing it.  She was brought to [facility].  She had an extensive workup including head CT, MRI of the brain, MRV of the head and a CT venogram.  These were all negative.  Her lab workup was also unremarkable.  Over the course of her ER stay, her mentation slowly improved.  [facility] was consulted.  By the time I saw her approximately 1:00 in the morning on 03/17/2023, her family had already left.  She is now alert and oriented x 4.  She knows that it is February 2025.  She knows the president.  She recalls being combative for EMS.  She does not know why.  Patient states that her right sided headache is only minor now.  She states that she has not had anything to eat or drink the last 24 hours.  I asked the nurse to her ginger ale and a sandwich.  I find no reason to medically admitted to the hospital.  Significant Labs: Unremarkable CBC.  White count 9.2, hemoglobin 12.0, platelet 296 Sodium 138, potassium 4.0, chloride 102, bicarb 26, BUN of 21, creatinine 1.0 Ammonia 19 Alcohol less than 10 UA only trace leukocytes UDS negative  Significant Imaging Studies: CT head showed no acute intracranial abnormality MRI brain/MRV head showed no  acute intracranial abnormality.  Chronic small vessel ischemic disease. CT venogram negative for dural sinus thrombosis  Antibiotic Therapy: Anti-infectives (From admission, onward)    None       Procedures:   Consultants:

## 2023-03-17 NOTE — Assessment & Plan Note (Addendum)
 Unclear the exact etiology of her altered mental status.  Could have been from her Compazine  or Robaxin  that was given to her yesterday in the ER at [facility] for her headache.  It also appears that she had a back injection for back pain with methylprednisolone  on 03/13/2023.  I doubt steroids to be the cause of her altered mental status that she has had back injections before with methylprednisolone .  Most likely offenders are the Compazine  or Robaxin .  She does not describe any dystonia but at 83 years old, I think Compazine  is most likely the offender.  I do not find an acute reason to admit her to the hospital.  I think she can eat and drink.  Family has ready left the ER.  Discussed this with the ER provder.  She is stable from my standpoint to go home.

## 2023-03-21 ENCOUNTER — Telehealth: Payer: Self-pay

## 2023-03-21 NOTE — Transitions of Care (Post Inpatient/ED Visit) (Signed)
03/21/2023  Name: Amanda Copeland MRN: [id number] DOB: [birth year]  Today's TOC FU Call Status: Today's TOC FU Call Status:: Successful TOC FU Call Completed TOC FU Call Complete Date: 03/21/23 Patient's Name and Date of Birth confirmed.  Transition Care Management Follow-up Telephone Call Date of Discharge: 03/17/23 Discharge Facility: [facility]) Type of Discharge: Emergency Department Reason for ED Visit: Other: (altered mental) How have you been since you were released from the hospital?: Better Any questions or concerns?: No  Items Reviewed: Did you receive and understand the discharge instructions provided?: Yes Medications obtained,verified, and reconciled?: Yes (Medications Reviewed) Any new allergies since your discharge?: No Dietary orders reviewed?: Yes Do you have support at home?: Yes People in Home: spouse  Medications Reviewed Today: Medications Reviewed Today     Reviewed by Karena Addison, LPN (Licensed Practical Nurse) on 03/21/23 at 1426  Med List Status: <None>   Medication Order Taking? Sig Documenting Provider Last Dose Status Informant  albuterol (VENTOLIN HFA) 108 (90 Base) MCG/ACT inhaler [id number] No Inhale 2 puffs into the lungs every 4 (four) hours as needed for wheezing or shortness of breath.  Patient not taking: Reported on 12/14/2022   Karren Burly, MD Not Taking Active   allopurinol (ZYLOPRIM) 100 MG tablet [id number] No TAKE 2 TABLETS BY MOUTH EVERY DAY Bradd Canary, MD Taking Active   AMBULATORY Clent Demark MEDICATION [id number] No Rollator  Dx: Gait instability Tat, Octaviano Batty, DO Taking Active   Ascorbic Acid (VITAMIN C PO) [id number] No Take 1 tablet by mouth daily.  [provider] Taking Active   aspirin 81 MG tablet [id number] No Take 81 mg by mouth daily. [provider] Taking Active   Azelastine HCl 137 MCG/SPRAY SOLN [id number]  Place 1-2 puffs into both nostrils in the morning and at bedtime. [provider]  Active   Budeson-Glycopyrrol-Formoterol (BREZTRI AEROSPHERE) 160-9-4.8 MCG/ACT AERO [id number] No Inhale 2 puffs into the lungs in the morning and at bedtime. Hunsucker, Lesia Sago, MD Taking Active   Budeson-Glycopyrrol-Formoterol (BREZTRI AEROSPHERE) 160-9-4.8 MCG/ACT Sandrea Matte [id number] No 2 puffs Inhalation Twice a day [provider] Taking Active   chlorpheniramine-HYDROcodone (TUSSIONEX) 10-8 MG/5ML [id number] No Take 5 mLs by mouth every 12 (twelve) hours as needed for cough. Donato Schultz, DO Taking Active   colchicine 0.6 MG tablet [id number] No Take 2 tabs at first and then 1 tab q 2 hours til pain relief or max of 6 tabs in 24 hours  Patient taking differently: Take 2 tabs at first and then 1 tab q 2 hours til pain relief or max of 6 tabs in 24 hours - PRN for gout flare-up   Bradd Canary, MD Taking Active Self  DYMISTA 137-50 MCG/ACT SUSP [id number] No 1-2 SPRAYS PER NOSTRIL TWICE DAILY FOR RUNNY NOSE Kozlow, Alvira Philips, MD Taking Active   ezetimibe (ZETIA) 10 MG tablet [id number] No TAKE 1 TABLET BY MOUTH EVERY DAY Bradd Canary, MD Taking Active   famotidine (PEPCID) 40 MG tablet [id number] No TAKE 1 TABLET BY MOUTH EVERYDAY AT BEDTIME Bradd Canary, MD Taking Active   fluticasone (FLONASE) 50 MCG/ACT nasal spray [id number]  Place 1 spray into both nostrils. [provider]  Active   furosemide (LASIX) 40 MG tablet [id number] No TAKE 1 TABLET BY MOUTH EVERY DAY Corky Crafts, MD Taking Active   KLOR-CON M20 20 MEQ tablet [id number] No TAKE 1 TABLET BY MOUTH DAILY. KEEP UPCOMING APPOINTMENT Corky Crafts, MD  Taking Active   levothyroxine (SYNTHROID) 112 MCG tablet [id number] No TAKE 1 TABLET BY MOUTH EVERY DAY BEFORE BREAKFAST Bradd Canary, MD Taking Active   Multiple Vitamins-Minerals (ICAPS AREDS 2) CAPS [id number] No Take 1 capsule by mouth 2 (two) times daily. [provider] Taking Active   PREVACID 30 MG capsule [id number] No  TAKE 1 CAPSULE BY MOUTH TWICE A DAY --MAX LIMIT Bradd Canary, MD Taking Active   propranolol (INDERAL) 40 MG tablet [id number]  Take 1 tablet (40 mg total) by mouth 2 (two) times daily. Vladimir Faster, DO  Active   Red Yeast Rice Extract (RED YEAST RICE PO) [id number] No Take by mouth daily. [provider] Taking Active             Home Care and Equipment/Supplies: Were Home Health Services Ordered?: NA Any new equipment or medical supplies ordered?: NA  Functional Questionnaire: Do you need assistance with bathing/showering or dressing?: No Do you need assistance with meal preparation?: No Do you need assistance with eating?: No Do you have difficulty maintaining continence: No Do you need assistance with getting out of bed/getting out of a chair/moving?: No Do you have difficulty managing or taking your medications?: No  Follow up appointments reviewed: PCP Follow-up appointment confirmed?: Yes Date of PCP follow-up appointment?: 03/22/23 Follow-up Provider: [facility] Follow-up appointment confirmed?: NA Do you need transportation to your follow-up appointment?: No Do you understand care options if your condition(s) worsen?: Yes-patient verbalized understanding    SIGNATURE Karena Addison, LPN [facility] Nurse Health Advisor Direct Dial [phone]

## 2023-03-22 ENCOUNTER — Ambulatory Visit: Payer: Medicare Other | Admitting: Family Medicine

## 2023-03-22 ENCOUNTER — Telehealth: Payer: Self-pay | Admitting: Family Medicine

## 2023-03-22 ENCOUNTER — Encounter: Payer: Self-pay | Admitting: Family Medicine

## 2023-03-22 VITALS — BP 135/76 | HR 52 | Temp 98.0°F | Resp 16 | Ht 61.0 in | Wt 232.0 lb

## 2023-03-22 DIAGNOSIS — S46819A Strain of other muscles, fascia and tendons at shoulder and upper arm level, unspecified arm, initial encounter: Secondary | ICD-10-CM | POA: Diagnosis not present

## 2023-03-22 DIAGNOSIS — T887XXA Unspecified adverse effect of drug or medicament, initial encounter: Secondary | ICD-10-CM | POA: Diagnosis not present

## 2023-03-22 DIAGNOSIS — G8929 Other chronic pain: Secondary | ICD-10-CM

## 2023-03-22 DIAGNOSIS — R519 Headache, unspecified: Secondary | ICD-10-CM | POA: Diagnosis not present

## 2023-03-22 DIAGNOSIS — M542 Cervicalgia: Secondary | ICD-10-CM

## 2023-03-22 MED ORDER — DICLOFENAC SODIUM 2 % EX SOLN: 2.0000 | Freq: Two times a day (BID) | CUTANEOUS | 1 refills | Status: AC | PRN

## 2023-03-22 NOTE — Progress Notes (Signed)
Chief Complaint  Patient presents with   Hospitalization Follow-up    Hospital follow up    Subjective: Patient is a 83 y.o. female here for ED f/u.  She is here with her spouse.  On 03/15/2023, she had the Emergency Department for headache.  CT head was unremarkable.  She was sent home with a muscle relaxer.  Not 24 hours later, she started becoming unresponsive.  She is also combative.  She was taken to the ER for further evaluation.  Further workup was unremarkable as well.  Her mentation has steadily improved since then.  Balance is at its baseline.  No new medications.  No drug or alcohol use.  She has been staying hydrated.  There are no signs of infection.  She does have a headache again.  She sees her eye doctor tomorrow.  She has a history of arthritis in her neck.  She has been using Voltaren gel 1% which has been helpful.  She is wondering if she can have the oral version of the diclofenac.  She is hoping it would be more effective.  Past Medical History:  Diagnosis Date   Arthritis    Asthma    childhood now returning   Back pain affecting pregnancy 11/02/2014   Benign essential tremor 10/28/2014   Bilateral dry eyes    Breast cancer (HCC)    right   Cancer (HCC)    Breast   Dry eyes    Encounter for Medicare annual wellness exam 05/22/2016   Essential hypertension, benign 10/28/2014   H/O measles    H/O mumps    History of chicken pox    Lumbago 11/02/2014   Mixed hyperlipidemia 10/28/2014   Obesity 10/28/2014   Stenosis of cervical spine     Objective: BP 135/76 (BP Location: Left Arm, Patient Position: Sitting)   Pulse (!) 52   Temp 98 F (36.7 C) (Oral)   Resp 16   Ht 5\' 1"  (1.549 m)   Wt 232 lb (105.2 kg)   SpO2 98%   BMI 43.84 kg/m  General: Awake, appears stated age Heart: RRR MSK: TTP over the cervical paraspinal musculature and trapezius musculature bilaterally.  No deformity, ecchymosis, erythema, or excessive warmth. Lungs: CTAB, no rales, wheezes or  rhonchi. No accessory muscle use Psych: Age appropriate judgment and insight, normal affect and mood  Assessment and Plan: Chronic neck pain - Plan: Ambulatory referral to Physical Therapy  Strain of trapezius muscle, unspecified laterality, initial encounter - Plan: Ambulatory referral to Physical Therapy  Nonintractable headache, unspecified chronicity pattern, unspecified headache type  Non-dose-related adverse effect of medication, initial encounter  Refer to physical therapy.  Neck stretches and exercises provided.  Ice, heat, Tylenol.  Continue Voltaren gel.  I would prefer she does not take the oral version of diclofenac or in the other anti-inflammatory given her current health status and age.  She accepts my response. As above. I think this is related to #2 and 1.  Appreciate ophthalmology input.  Appreciate normal brain imaging.   I have added methocarbamol to her allergy list as a contraindication.   The patient and her spouse voiced understanding and agreement to the plan.  I spent 42 minutes with the patient discussing the above plans in addition to reviewing her chart on the same day of the visit.  Jilda Roche [place], DO 03/22/23  12:28 PM

## 2023-03-22 NOTE — Patient Instructions (Signed)
OK to take Tylenol 1000 mg (2 extra strength tabs) or 975 mg (3 regular strength tabs) every 6 hours as needed.  Heat (pad or rice pillow in microwave) over affected area, 10-15 minutes twice daily.   Ice/cold pack over area for 10-15 min twice daily.  If you do not hear anything about your referral in the next 1-2 weeks, call our office and ask for an update.  Let us know if you need anything.  EXERCISES RANGE OF MOTION (ROM) AND STRETCHING EXERCISES  These exercises may help you when beginning to rehabilitate your issue. In order to successfully resolve your symptoms, you must improve your posture. These exercises are designed to help reduce the forward-head and rounded-shoulder posture which contributes to this condition. Your symptoms may resolve with or without further involvement from your physician, physical therapist or athletic trainer. While completing these exercises, remember:  Restoring tissue flexibility helps normal motion to return to the joints. This allows healthier, less painful movement and activity. An effective stretch should be held for at least 20 seconds, although you may need to begin with shorter hold times for comfort. A stretch should never be painful. You should only feel a gentle lengthening or release in the stretched tissue. Do not do any stretch or exercise that you cannot tolerate.  STRETCH- Axial Extensors Lie on your back on the floor. You may bend your knees for comfort. Place a rolled-up hand towel or dish towel, about 2 inches in diameter, under the part of your head that makes contact with the floor. Gently tuck your chin, as if trying to make a "double chin," until you feel a gentle stretch at the base of your head. Hold 15-20 seconds. Repeat 2-3 times. Complete this exercise 1 time per day.   STRETCH - Axial Extension  Stand or sit on a firm surface. Assume a good posture: chest up, shoulders drawn back, abdominal muscles slightly tense, knees  unlocked (if standing) and feet hip width apart. Slowly retract your chin so your head slides back and your chin slightly lowers. Continue to look straight ahead. You should feel a gentle stretch in the back of your head. Be certain not to feel an aggressive stretch since this can cause headaches later. Hold for 15-20 seconds. Repeat 2-3 times. Complete this exercise 1 time per day.  STRETCH - Cervical Side Bend  Stand or sit on a firm surface. Assume a good posture: chest up, shoulders drawn back, abdominal muscles slightly tense, knees unlocked (if standing) and feet hip width apart. Without letting your nose or shoulders move, slowly tip your right / left ear to your shoulder until your feel a gentle stretch in the muscles on the opposite side of your neck. Hold 15-20 seconds. Repeat 2-3 times. Complete this exercise 1-2 times per day.  STRETCH - Cervical Rotators  Stand or sit on a firm surface. Assume a good posture: chest up, shoulders drawn back, abdominal muscles slightly tense, knees unlocked (if standing) and feet hip width apart. Keeping your eyes level with the ground, slowly turn your head until you feel a gentle stretch along the back and opposite side of your neck. Hold 15-20 seconds. Repeat 2-3 times. Complete this exercise 1-2 times per day.  RANGE OF MOTION - Neck Circles  Stand or sit on a firm surface. Assume a good posture: chest up, shoulders drawn back, abdominal muscles slightly tense, knees unlocked (if standing) and feet hip width apart. Gently roll your head down and around  from the back of one shoulder to the back of the other. The motion should never be forced or painful. Repeat the motion 10-20 times, or until you feel the neck muscles relax and loosen. Repeat 2-3 times. Complete the exercise 1-2 times per day. STRENGTHENING EXERCISES - Cervical Strain and Sprain These exercises may help you when beginning to rehabilitate your injury. They may resolve your  symptoms with or without further involvement from your physician, physical therapist, or athletic trainer. While completing these exercises, remember:  Muscles can gain both the endurance and the strength needed for everyday activities through controlled exercises. Complete these exercises as instructed by your physician, physical therapist, or athletic trainer. Progress the resistance and repetitions only as guided. You may experience muscle soreness or fatigue, but the pain or discomfort you are trying to eliminate should never worsen during these exercises. If this pain does worsen, stop and make certain you are following the directions exactly. If the pain is still present after adjustments, discontinue the exercise until you can discuss the trouble with your clinician.  STRENGTH - Cervical Flexors, Isometric Face a wall, standing about 6 inches away. Place a small pillow, a ball about 6-8 inches in diameter, or a folded towel between your forehead and the wall. Slightly tuck your chin and gently push your forehead into the soft object. Push only with mild to moderate intensity, building up tension gradually. Keep your jaw and forehead relaxed. Hold 10 to 20 seconds. Keep your breathing relaxed. Release the tension slowly. Relax your neck muscles completely before you start the next repetition. Repeat 2-3 times. Complete this exercise 1 time per day.  STRENGTH- Cervical Lateral Flexors, Isometric  Stand about 6 inches away from a wall. Place a small pillow, a ball about 6-8 inches in diameter, or a folded towel between the side of your head and the wall. Slightly tuck your chin and gently tilt your head into the soft object. Push only with mild to moderate intensity, building up tension gradually. Keep your jaw and forehead relaxed. Hold 10 to 20 seconds. Keep your breathing relaxed. Release the tension slowly. Relax your neck muscles completely before you start the next repetition. Repeat 2-3  times. Complete this exercise 1 time per day.  STRENGTH - Cervical Extensors, Isometric  Stand about 6 inches away from a wall. Place a small pillow, a ball about 6-8 inches in diameter, or a folded towel between the back of your head and the wall. Slightly tuck your chin and gently tilt your head back into the soft object. Push only with mild to moderate intensity, building up tension gradually. Keep your jaw and forehead relaxed. Hold 10 to 20 seconds. Keep your breathing relaxed. Release the tension slowly. Relax your neck muscles completely before you start the next repetition. Repeat 2-3 times. Complete this exercise 1 time per day.  POSTURE AND BODY MECHANICS CONSIDERATIONS Keeping correct posture when sitting, standing or completing your activities will reduce the stress put on different body tissues, allowing injured tissues a chance to heal and limiting painful experiences. The following are general guidelines for improved posture. Your physician or physical therapist will provide you with any instructions specific to your needs. While reading these guidelines, remember: The exercises prescribed by your provider will help you have the flexibility and strength to maintain correct postures. The correct posture provides the optimal environment for your joints to work. All of your joints have less wear and tear when properly supported by a spine with  good posture. This means you will experience a healthier, less painful body. Correct posture must be practiced with all of your activities, especially prolonged sitting and standing. Correct posture is as important when doing repetitive low-stress activities (typing) as it is when doing a single heavy-load activity (lifting).  PROLONGED STANDING WHILE SLIGHTLY LEANING FORWARD When completing a task that requires you to lean forward while standing in one place for a long time, place either foot up on a stationary 2- to 4-inch high object to help  maintain the best posture. When both feet are on the ground, the low back tends to lose its slight inward curve. If this curve flattens (or becomes too large), then the back and your other joints will experience too much stress, fatigue more quickly, and can cause pain.   RESTING POSITIONS Consider which positions are most painful for you when choosing a resting position. If you have pain with flexion-based activities (sitting, bending, stooping, squatting), choose a position that allows you to rest in a less flexed posture. You would want to avoid curling into a fetal position on your side. If your pain worsens with extension-based activities (prolonged standing, working overhead), avoid resting in an extended position such as sleeping on your stomach. Most people will find more comfort when they rest with their spine in a more neutral position, neither too rounded nor too arched. Lying on a non-sagging bed on your side with a pillow between your knees, or on your back with a pillow under your knees will often provide some relief. Keep in mind, being in any one position for a prolonged period of time, no matter how correct your posture, can still lead to stiffness.  WALKING Walk with an upright posture. Your ears, shoulders, and hips should all line up. OFFICE WORK When working at a desk, create an environment that supports good, upright posture. Without extra support, muscles fatigue and lead to excessive strain on joints and other tissues.  CHAIR: A chair should be able to slide under your desk when your back makes contact with the back of the chair. This allows you to work closely. The chair's height should allow your eyes to be level with the upper part of your monitor and your hands to be slightly lower than your elbows. Body position: Your feet should make contact with the floor. If this is not possible, use a foot rest. Keep your ears over your shoulders. This will reduce stress on your neck  and low back.  Trapezius stretches/exercises Do exercises exactly as told by your health care provider and adjust them as directed. It is normal to feel mild stretching, pulling, tightness, or discomfort as you do these exercises, but you should stop right away if you feel sudden pain or your pain gets worse.   Stretching and range of motion exercises These exercises warm up your muscles and joints and improve the movement and flexibility of your shoulder. These exercises can also help to relieve pain, numbness, and tingling. If you are unable to do any of the following for any reason, do not further attempt to do it.   Exercise A: Flexion, standing     Stand and hold a broomstick, a cane, or a similar object. Place your hands a little more than shoulder-width apart on the object. Your left / right hand should be palm-up, and your other hand should be palm-down. Push the stick to raise your left / right arm out to your side and then over  your head. Use your other hand to help move the stick. Stop when you feel a stretch in your shoulder, or when you reach the angle that is recommended by your health care provider. Avoid shrugging your shoulder while you raise your arm. Keep your shoulder blade tucked down toward your spine. Hold for 30 seconds. Slowly return to the starting position. Repeat 2 times. Complete this exercise 3 times per week.  Exercise B: Abduction, supine     Lie on your back and hold a broomstick, a cane, or a similar object. Place your hands a little more than shoulder-width apart on the object. Your left / right hand should be palm-up, and your other hand should be palm-down. Push the stick to raise your left / right arm out to your side and then over your head. Use your other hand to help move the stick. Stop when you feel a stretch in your shoulder, or when you reach the angle that is recommended by your health care provider. Avoid shrugging your shoulder while you raise  your arm. Keep your shoulder blade tucked down toward your spine. Hold for 30 seconds. Slowly return to the starting position. Repeat 2 times. Complete this exercise 3 times per week.  Exercise C: Flexion, active-assisted     Lie on your back. You may bend your knees for comfort. Hold a broomstick, a cane, or a similar object. Place your hands about shoulder-width apart on the object. Your palms should face toward your feet. Raise the stick and move your arms over your head and behind your head, toward the floor. Use your healthy arm to help your left / right arm move farther. Stop when you feel a gentle stretch in your shoulder, or when you reach the angle where your health care provider tells you to stop. Hold for 30 seconds. Slowly return to the starting position. Repeat 2 times. Complete this exercise 3 times per week.  Exercise D: External rotation and abduction     Stand in a door frame with one of your feet slightly in front of the other. This is called a staggered stance. Choose one of the following positions as told by your health care provider: Place your hands and forearms on the door frame above your head. Place your hands and forearms on the door frame at the height of your head. Place your hands on the door frame at the height of your elbows. Slowly move your weight onto your front foot until you feel a stretch across your chest and in the front of your shoulders. Keep your head and chest upright and keep your abdominal muscles tight. Hold for 30 seconds. To release the stretch, shift your weight to your back foot. Repeat 2 times. Complete this stretch 3 times per week.  Strengthening exercises These exercises build strength and endurance in your shoulder. Endurance is the ability to use your muscles for a long time, even after your muscles get tired. Exercise E: Scapular depression and adduction  Sit on a stable chair. Support your arms in front of you with pillows,  armrests, or a tabletop. Keep your elbows in line with the sides of your body. Gently move your shoulder blades down toward your middle back. Relax the muscles on the tops of your shoulders and in the back of your neck. Hold for 3 seconds. Slowly release the tension and relax your muscles completely before doing this exercise again. Repeat for a total of 10 repetitions. After you have practiced  this exercise, try doing the exercise without the arm support. Then, try the exercise while standing instead of sitting. Repeat 2 times. Complete this exercise 3 times per week.  Exercise F: Shoulder abduction, isometric     Stand or sit about 4-6 inches (10-15 cm) from a wall with your left / right side facing the wall. Bend your left / right elbow and gently press your elbow against the wall. Increase the pressure slowly until you are pressing as hard as you can without shrugging your shoulder. Hold for 3 seconds. Slowly release the tension and relax your muscles completely. Repeat for a total of 10 repetitions. Repeat 2 times. Complete this exercise 3 times per week.  Exercise G: Shoulder flexion, isometric     Stand or sit about 4-6 inches (10-15 cm) away from a wall with your left / right side facing the wall. Keep your left / right elbow straight and gently press the top of your fist against the wall. Increase the pressure slowly until you are pressing as hard as you can without shrugging your shoulder. Hold for 10-15 seconds. Slowly release the tension and relax your muscles completely. Repeat for a total of 10 repetitions. Repeat 2 times. Complete this exercise 3 times per week.  Exercise H: Internal rotation     Sit in a stable chair without armrests, or stand. Secure an exercise band at your left / right side, at elbow height. Place a soft object, such as a folded towel or a small pillow, under your left / right upper arm so your elbow is a few inches (about 8 cm) away from your  side. Hold the end of the exercise band so the band stretches. Keeping your elbow pressed against the soft object under your arm, move your forearm across your body toward your abdomen. Keep your body steady so the movement is only coming from your shoulder. Hold for 3 seconds. Slowly return to the starting position. Repeat for a total of 10 repetitions. Repeat 2 times. Complete this exercise 3 times per week.  Exercise I: External rotation     Sit in a stable chair without armrests, or stand. Secure an exercise band at your left / right side, at elbow height. Place a soft object, such as a folded towel or a small pillow, under your left / right upper arm so your elbow is a few inches (about 8 cm) away from your side. Hold the end of the exercise band so the band stretches. Keeping your elbow pressed against the soft object under your arm, move your forearm out, away from your abdomen. Keep your body steady so the movement is only coming from your shoulder. Hold for 3 seconds. Slowly return to the starting position. Repeat for a total of 10 repetitions. Repeat 2 times. Complete this exercise 3 times per week. Exercise J: Shoulder extension  Sit in a stable chair without armrests, or stand. Secure an exercise band to a stable object in front of you so the band is at shoulder height. Hold one end of the exercise band in each hand. Your palms should face each other. Straighten your elbows and lift your hands up to shoulder height. Step back, away from the secured end of the exercise band, until the band stretches. Squeeze your shoulder blades together and pull your hands down to the sides of your thighs. Stop when your hands are straight down by your sides. Do not let your hands go behind your body. Hold for  3 seconds. Slowly return to the starting position. Repeat for a total of 10 repetitions. Repeat 2 times. Complete this exercise 3 times per week.  Exercise K: Shoulder extension, prone      Lie on your abdomen on a firm surface so your left / right arm hangs over the edge. Hold a 5 lb weight in your hand so your palm faces in toward your body. Your arm should be straight. Squeeze your shoulder blade down toward the middle of your back. Slowly raise your arm behind you, up to the height of the surface that you are lying on. Keep your arm straight. Hold for 3 seconds. Slowly return to the starting position and relax your muscles. Repeat for a total of 10 repetitions. Repeat 2 times. Complete this exercise 3 times per week.   Exercise L: Horizontal abduction, prone  Lie on your abdomen on a firm surface so your left / right arm hangs over the edge. Hold a 5 lb weight in your hand so your palm faces toward your feet. Your arm should be straight. Squeeze your shoulder blade down toward the middle of your back. Bend your elbow so your hand moves up, until your elbow is bent to an "L" shape (90 degrees). With your elbow bent, slowly move your forearm forward and up. Raise your hand up to the height of the surface that you are lying on. Your upper arm should not move, and your elbow should stay bent. At the top of the movement, your palm should face the floor. Hold for 3 seconds. Slowly return to the starting position and relax your muscles. Repeat for a total of 10 repetitions. Repeat 2 times. Complete this exercise 3 times per week.  Exercise M: Horizontal abduction, standing  Sit on a stable chair, or stand. Secure an exercise band to a stable object in front of you so the band is at shoulder height. Hold one end of the exercise band in each hand. Straighten your elbows and lift your hands straight in front of you, up to shoulder height. Your palms should face down, toward the floor. Step back, away from the secured end of the exercise band, until the band stretches. Move your arms out to your sides, and keep your arms straight. Hold for 3 seconds. Slowly return to the  starting position. Repeat for a total of 10 repetitions. Repeat 2 times. Complete this exercise 3 times per week.  Exercise N: Scapular retraction and elevation  Sit on a stable chair, or stand. Secure an exercise band to a stable object in front of you so the band is at shoulder height. Hold one end of the exercise band in each hand. Your palms should face each other. Sit in a stable chair without armrests, or stand. Step back, away from the secured end of the exercise band, until the band stretches. Squeeze your shoulder blades together and lift your hands over your head. Keep your elbows straight. Hold for 3 seconds. Slowly return to the starting position. Repeat for a total of 10 repetitions. Repeat 2 times. Complete this exercise 3 times per week.  This information is not intended to replace advice given to you by your health care provider. Make sure you discuss any questions you have with your health care provider. Document Released: 01/24/2005 Document Revised: 10/01/2015 Document Reviewed: 12/11/2014 Elsevier Interactive Patient Education  2017 [employer].

## 2023-03-22 NOTE — Telephone Encounter (Signed)
PA initiated via Covermymeds; KEY; [id number]. Awaiting determination.

## 2023-03-23 DIAGNOSIS — H35363 Drusen (degenerative) of macula, bilateral: Secondary | ICD-10-CM | POA: Diagnosis not present

## 2023-03-23 DIAGNOSIS — H35721 Serous detachment of retinal pigment epithelium, right eye: Secondary | ICD-10-CM | POA: Diagnosis not present

## 2023-03-23 DIAGNOSIS — H353132 Nonexudative age-related macular degeneration, bilateral, intermediate dry stage: Secondary | ICD-10-CM | POA: Diagnosis not present

## 2023-03-23 DIAGNOSIS — H43813 Vitreous degeneration, bilateral: Secondary | ICD-10-CM | POA: Diagnosis not present

## 2023-03-24 ENCOUNTER — Other Ambulatory Visit: Payer: Self-pay | Admitting: Family Medicine

## 2023-03-24 ENCOUNTER — Telehealth: Payer: Self-pay

## 2023-03-24 NOTE — Telephone Encounter (Signed)
PA denied. Neck pain is a non-FDA approved indication. Diclofenac 2% solution only FDA approved for actinic keratosis.

## 2023-03-24 NOTE — Telephone Encounter (Signed)
Disregard- message already sent to Dr. Carmelia Roller

## 2023-03-24 NOTE — Telephone Encounter (Signed)
PA denied. Awaiting denial information.

## 2023-03-24 NOTE — Telephone Encounter (Signed)
Please inform patient and have her continue the 1%.  Thank you.

## 2023-03-24 NOTE — Telephone Encounter (Signed)
Called pt was advised to continue  Diclofenac 1%, pt stated she fine with 1%.

## 2023-03-24 NOTE — Telephone Encounter (Signed)
Copied from CRM [id number]. Topic: Clinical - Prescription Issue >> Mar 24, 2023  8:46 AM Gurney Maxin H wrote: Reason for CRM: Karrie Doffing with [employer] called to state that the medication diclofenac Sodium (PENNSAID) 2 % SOLN has been denied, states medication is not FDA approved for the diagnosis, if any questions refer to the number on the back of members insurance card for provider services. Authorization [id number]

## 2023-04-04 NOTE — Therapy (Signed)
 OUTPATIENT PHYSICAL THERAPY CERVICAL EVALUATION   Patient Name: Amanda Copeland MRN: [id number] DOB:1940-06-20, 83 y.o., female Today's Date: 04/06/2023   END OF SESSION:  PT End of Session - 04/06/23 1316     Visit Number 1    Date for PT Re-Evaluation 06/01/23    Authorization Type Medicare & BCBS    Progress Note Due on Visit 10    PT Start Time 1316    PT Stop Time 1401    PT Time Calculation (min) 45 min    Activity Tolerance Patient tolerated treatment well;No increased pain    Behavior During Therapy [facility] for tasks assessed/performed             Past Medical History:  Diagnosis Date   Arthritis    Asthma    childhood now returning   Back pain affecting pregnancy 11/02/2014   Benign essential tremor 10/28/2014   Bilateral dry eyes    Breast cancer (HCC)    right   Cancer (HCC)    Breast   Dry eyes    Encounter for Medicare annual wellness exam 05/22/2016   Essential hypertension, benign 10/28/2014   H/O measles    H/O mumps    History of chicken pox    Lumbago 11/02/2014   Mixed hyperlipidemia 10/28/2014   Obesity 10/28/2014   Stenosis of cervical spine    Past Surgical History:  Procedure Laterality Date   BACK SURGERY     BREAST REDUCTION SURGERY Left    CHOLECYSTECTOMY     EYE SURGERY  2008   b/l cataracts removed, in [place]   MASTECTOMY Right    REDUCTION MAMMAPLASTY     REPLACEMENT TOTAL KNEE BILATERAL Bilateral    ROTATOR CUFF REPAIR Left    Patient Active Problem List   Diagnosis Date Noted   Altered mental state 03/17/2023   Chronic bronchitis (HCC) 06/14/2022   Osteopenia 05/25/2021   Scapular dysfunction 03/17/2021   Compression fracture of T11 vertebra (HCC) 01/20/2021   SI joint arthritis (HCC) 01/20/2021   Vitamin D deficiency 01/20/2021   Age-related osteoporosis with current pathological fracture 11/26/2020   Pain of right upper extremity 11/11/2020   Bradycardia 11/11/2020   Neck pain 03/11/2020   Tremor 03/01/2020    Hypothyroid 03/01/2020   Physical deconditioning 11/25/2019   At risk for obstructive sleep apnea 11/25/2019   Primary osteoarthritis of right shoulder 02/28/2019   Other forms of dyspnea 11/22/2018   Educated about COVID-19 virus infection 07/03/2018   Greater trochanteric pain syndrome of right lower extremity 03/18/2018   Chronic nonallergic rhinitis 12/20/2017   Cough 12/20/2017   Right flank pain 12/10/2017   Fatigue 07/12/2017   Obesity, Class III, BMI 40-49.9 (morbid obesity) (HCC) 03/05/2017   Chronic diastolic heart failure (HCC) 11/29/2016   Dyspnea on exertion 10/06/2016   Asthma 10/06/2016   Preventative health care 05/22/2016   Hyperglycemia 08/07/2015   Allergic 08/07/2015   Gout 08/07/2015   Lower abdominal pain 04/21/2015   Low back pain 11/02/2014   Esophageal reflux 10/28/2014   Essential hypertension, benign 10/28/2014   Hyperlipidemia 10/28/2014   Benign essential tremor 10/28/2014   Bilateral dry eyes    Moderate persistent asthma    Arthritis    Cancer (HCC)    History of chicken pox     PCP: Bradd Canary, MD   REFERRING PROVIDER: Sharlene Dory, DO  REFERRING DIAG:  [phone] (ICD-10-CM) - Chronic neck pain  S46.819A (ICD-10-CM) - Strain of trapezius muscle, unspecified  laterality, initial encounter   THERAPY DIAG:  Unsteadiness on feet  Other abnormalities of gait and mobility  Muscle weakness (generalized)  Abnormal posture  Cervicalgia  Radiculopathy, cervical region  Other muscle spasm  Other symptoms and signs involving the musculoskeletal system  RATIONALE FOR EVALUATION AND TREATMENT: Rehabilitation  ONSET DATE: Chronic (neck pain); 03/16/23 - altered mental status and decline in physical functioning  NEXT MD VISIT: 05/29/23 with PCP   SUBJECTIVE:                                                                                                                                                                                                          SUBJECTIVE STATEMENT: Pt reports ~3 weeks ago she started having headaches which was concerning as she does not normally have headaches.  Went to ED and was given a migraine "cocktail" and muscle relaxant and sent home.  She does not recall what happened next but states her family told that the next day when she awoke she was nonverbal with her family and so her family called 911.  When EMS arrived she was combative with the firefighters and EMS.  Brain CT and MRI taken in the ED and after being taken back to the hospital were negative.  Since the hospitalization she has had some word finding issues as well as difficulty with tasks such as playing cards which is something she routinely does.  Her PT referral is for her chronic neck pain and UT strain but she feels that her neck is at it's baseline for her OA in her neck.  She states Dr Rennis Chris wants her to get a "new" shoulder (TSA) but she is not sure she wants to proceed with surgery.  She has had good relief from her latest ESI as far as her chronic LBP.  She feels like she would prefer PT to assess her mobility and balance.     PAIN: Are you having pain? No  PERTINENT HISTORY:  Chronic neck pain, chronic LBP with h/o surgery for "ruptured disc", lumbar spinal stenosis, OA, B TKA, L RTC repair, chronic diastolic HF, HLD, moderate asthma, DOE, HTN, breast cancer, R mastectomy, benign essential tremor, morbid obesity   PRECAUTIONS: Fall  RED FLAGS: None  HAND DOMINANCE: Right  WEIGHT BEARING RESTRICTIONS: No  FALLS:  Has patient fallen in last 6 months? No - reports "a couple close calls"  LIVING ENVIRONMENT:  Lives with: lives with their spouse Lives in: House/apartment Stairs: Yes: External: 1 steps; none over threshold Has following equipment at home: Single  point cane, Walker - 2 wheeled, Environmental consultant - 4 wheeled, Wheelchair (manual), shower chair, and bed side commode  OCCUPATION: Retired  PLOF:  Independent and Leisure: going on cruises, movies, playing cards with friends, swimming in the summer (uses lift to get into pool)     PATIENT GOALS: "To feel like normal again."   OBJECTIVE: (objective measures completed at initial evaluation unless otherwise dated)  DIAGNOSTIC FINDINGS:  03/16/23 - CT Venogram:  IMPRESSION: 1. Negative CT venogram with no evidence for dural venous sinus thrombosis. Previously questioned abnormality on prior MRI felt to be consistent with artifact. 2. No other acute intracranial abnormality. 3. Mild age-related cerebral atrophy with chronic small vessel ischemic disease.  03/16/23 - MR Brain: IMPRESSION: 1. No acute intracranial abnormality. 2. Findings of chronic small vessel ischemia and volume loss. 3. Severely motion degraded MRV. 4. Possible filling defect within the straight sinus and medial transverse sinuses, which could indicate venous sinus thrombosis. CT venogram is recommended.  03/16/23 - CT Head: IMPRESSION: 1. No acute intracranial abnormality. 2. Generalized atrophy and findings of chronic microvascular disease.  07/07/21 - MR Cervical spine: IMPRESSION: 1. Degenerative changes of the cervical spine with moderate spinal canal stenosis at C5-6 and C6-7 with mild mass effect on the cord at these levels without cord signal abnormality. 2. Multilevel high-grade neural foraminal narrowing, as described above, severe bilaterally at C5-6.  PATIENT SURVEYS:  ABC scale 870 / 1600 = 54.4 %, 50-80% = moderate level of physical functioning NDI 17 / 50 = 34.0 %, moderate disability   COGNITION: Overall cognitive status: Impaired  SENSATION: WFL  COORDINATION: Movement patterns WFL but slow  POSTURE:  rounded shoulders, forward head, and increased thoracic kyphosis  CERVICAL ROM:   Active ROM   Flexion   Extension   Right lateral flexion   Left lateral flexion   Right rotation   Left rotation    (Blank rows = not tested)  UPPER  EXTREMITY ROM: B shoulder limited in overhead ROM d/t OA (Dr. Rennis Chris recommending R TSA)  UPPER EXTREMITY MMT:  MMT Right eval Left eval  Shoulder flexion    Shoulder extension    Shoulder abduction    Shoulder adduction    Shoulder internal rotation    Shoulder external rotation    Middle trapezius    Lower trapezius    Elbow flexion    Elbow extension    Wrist flexion    Wrist extension    Wrist ulnar deviation    Wrist radial deviation    Wrist pronation    Wrist supination    Grip strength     (Blank rows = not tested)  LOWER EXTREMITY MMT:  (tested in sitting)  MMT Right eval Left eval  Hip flexion 3+ 3+  Hip extension 3- 3-  Hip abduction 4- 4-  Hip adduction 3- 3-  Hip internal rotation    Hip external rotation    Knee flexion 4- 4-  Knee extension 4- 4  Ankle dorsiflexion 4 4  Ankle plantarflexion    Ankle inversion    Ankle eversion     (Blank rows = not tested)  FUNCTIONAL TESTS: (Remaining test to be completed next visit)  5 times sit to stand: 32.66 sec w/o UE assist Timed up and go (TUG):   10 meter walk test:   Berg Balance Scale: 31/56, < 36 high risk for falls (close to 100%)  Dynamic Gait Index:   MCTSIB: Condition 1: Avg of 3 trials: 30  sec, Condition 2: Avg of 3 trials: 30 sec, Condition 3: Avg of 3 trials: 24 sec, Condition 4: Avg of 3 trials: 18 sec, and Total Score: 102/120   TODAY'S TREATMENT:   04/06/2023 - Eval SELF CARE:  Reviewed eval findings, including high risk for falls and associated precautions to reduce fall risk, and role of PT in addressing identified deficits as well as need for further assessment of gait and dynamic balance.    PATIENT EDUCATION:  Education details: PT eval findings, anticipated POC, and need for further assessment of dynamic balance/gait stability   Person educated: Patient Education method: Explanation Education comprehension: verbalized understanding  HOME EXERCISE  PROGRAM: TBD   ASSESSMENT:  CLINICAL IMPRESSION: Amanda Copeland is a 83 y.o. female who was referred to physical therapy for evaluation and treatment for chronic neck pain and strain of trapezius muscle.  She feels like her neck is at its baseline with mild chronic pain due to OA (none today) but is more concerned about the changes she has experienced in her mental state and physical functioning since her ED and hospital visit on 03/16/2023.  Gross motor screening demonstrating slowing of movement patterns but coordination WFL.  B UE ROM and strength limited due to B shoulder OA but essentially at baseline per recent PT episodes.  Increased BLE weakness evident with slower movement patterns and increased instability evident.  Examination revealed patient is at risk for falls and functional decline as evidenced by the following objective test measures: mCTSIB - position 1: 30 sec, position 2: 30 sec, position 3: 24 sec, position 4: 18 sec (30 sec in each position demonstrates equal weighting of balance systems), 5xSTS of 32.66 sec (>15 sec indicates increased risk for falls and decreased B LE power), and Berg score of 31/56 indicating a high risk for falls (close to 100%).  Further balance assessment for dynamic gait activities indicated.  ABC scale score of 870 / 1600 indicating 54.4 % balance confidence, with scores between 50-80% indicating a moderate level of physical functioning.  Amanda "[name]" will benefit from skilled PT to address above deficits to improve mobility and activity tolerance to help reach the maximal level of functional independence and mobility.  We will also address deficits related to chronic neck pain and trapezius strain as these impact her daily functioning.  Patient demonstrates understanding of this POC and is in agreement with this plan.   OBJECTIVE IMPAIRMENTS: Abnormal gait, decreased activity tolerance, decreased balance, decreased knowledge of condition, decreased mobility,  difficulty walking, decreased ROM, decreased strength, increased fascial restrictions, impaired perceived functional ability, increased muscle spasms, impaired flexibility, impaired UE functional use, improper body mechanics, postural dysfunction, and pain.   ACTIVITY LIMITATIONS: carrying, lifting, bending, standing, squatting, stairs, transfers, bed mobility, bathing, dressing, reach over head, locomotion level, and caring for others  PARTICIPATION LIMITATIONS: meal prep, cleaning, laundry, shopping, and community activity  PERSONAL FACTORS: Age, Fitness, Past/current experiences, Time since onset of injury/illness/exacerbation, and 3+ comorbidities: Chronic neck pain, chronic LBP with h/o surgery for "ruptured disc", lumbar spinal stenosis, OA, B TKA, L RTC repair, chronic diastolic HF, HLD, moderate asthma, DOE, HTN, breast cancer, R mastectomy, benign essential tremor, morbid obesity  are also affecting patient's functional outcome.   REHAB POTENTIAL: Good  CLINICAL DECISION MAKING: Unstable/unpredictable  EVALUATION COMPLEXITY: High   GOALS: Goals reviewed with patient? Yes  SHORT TERM GOALS: Target date: 05/04/2023  Complete remaining gait and dynamic balance assessment and update LTG's as indicated.  Baseline:  Goal status:  INITIAL  2.  Patient will be independent with initial HEP to improve outcomes and carryover.  Baseline:  Goal status: INITIAL  3.  Patient will improve 5x STS time to </= 25 seconds for improved efficiency and safety with transfers. Baseline: 32.66 sec Goal status: INITIAL  LONG TERM GOALS: Target date: 06/01/2023  Patient will be independent with advanced/ongoing HEP to facilitate ability to maintain/progress functional gains from skilled physical therapy services. Baseline:  Goal status: INITIAL  2.  Patient will be able to ambulate 600' with or w/o LRAD on variable surfaces with good safety to access community.  Baseline:  Goal status:  INITIAL  3.  Patient will demonstrate improved B LE strength to >/= 4/5 for improved stability and ease of mobility . Baseline: Refer to above LE MMT table Goal status: INITIAL  4.  Patient will demonstrate gait speed of >/= 1.8 ft/sec (0.55 m/s) to be a safe limited community ambulator with decreased risk for recurrent falls.  Baseline:  Goal status: INITIAL  5.  Patient will improve Berg score to >/= 39/56 to improve safety and stability with ADLs in standing and reduce risk for falls. (MCID= 8 points)  Baseline: 31/56 Goal status: INITIAL  6.  Patient will demonstrate at least 19/24 on DGI to improve gait stability and reduce risk for falls. Baseline:  Goal status: INITIAL  7.  Patient will report >/= 68% on ABC scale to demonstrate improved balance confidence with functional mobility and gait. Baseline: 870 / 1600 = 54.4 % Goal status: INITIAL   PLAN:  PT FREQUENCY: 2x/week  PT DURATION: 8 weeks  PLANNED INTERVENTIONS: 97164- PT Re-evaluation, 97110-Therapeutic exercises, 97530- Therapeutic activity, 97112- Neuromuscular re-education, 97535- Self Care, 16109- Manual therapy, [phone]- Gait training, [phone]- Aquatic Therapy, [phone]- Electrical stimulation (unattended), 97035- Ultrasound, 29562- Ionotophoresis 4mg /ml Dexamethasone, Patient/Family education, Balance training, Stair training, Taping, Dry Needling, Joint mobilization, Spinal mobilization, Cryotherapy, Moist heat, and 97750- Physical performance test or measurement  PLAN FOR NEXT SESSION: Assess cervical ROM and UE MMT; complete balance assessment - 10MWT/gait speed, TUG and DGI; create initial HEP for LE strengthening and balance - possible [place] program    Marry Guan, PT 04/06/2023, 6:43 PM

## 2023-04-04 NOTE — Telephone Encounter (Signed)
 2025 [employer] benefits re-verified  Patient is ready for scheduling on or after: 04/04/23 BUY AND BILL  Out-of-pocket cost due at time of visit: $50  Primary: [place] Medicare Prolia co-insurance: 20% Admin fee co-insurance: 20%   Deductible: $257 ($257 met)  Prior Auth: not req'd  Secondary: [employer] For the secondary MD Purchase option, the plan coordinates, Prolia and administration are covered at 100% after a $50 deductible ($0.00 met). Deductible does contribute to a $500 out of pocket max ($11.70 met). The payer has indicated that you are out of network for this patient's plan. The benefits provided are for out of network providers.  Deductible: $50 ($0 met)

## 2023-04-05 ENCOUNTER — Other Ambulatory Visit: Payer: Self-pay | Admitting: *Deleted

## 2023-04-05 DIAGNOSIS — M810 Age-related osteoporosis without current pathological fracture: Secondary | ICD-10-CM

## 2023-04-06 ENCOUNTER — Encounter: Payer: Self-pay | Admitting: Physical Therapy

## 2023-04-06 ENCOUNTER — Ambulatory Visit: Payer: Medicare Other | Attending: Family Medicine | Admitting: Physical Therapy

## 2023-04-06 ENCOUNTER — Other Ambulatory Visit: Payer: Self-pay

## 2023-04-06 DIAGNOSIS — S46819A Strain of other muscles, fascia and tendons at shoulder and upper arm level, unspecified arm, initial encounter: Secondary | ICD-10-CM | POA: Insufficient documentation

## 2023-04-06 DIAGNOSIS — G8929 Other chronic pain: Secondary | ICD-10-CM | POA: Diagnosis not present

## 2023-04-06 DIAGNOSIS — M5412 Radiculopathy, cervical region: Secondary | ICD-10-CM | POA: Diagnosis not present

## 2023-04-06 DIAGNOSIS — R293 Abnormal posture: Secondary | ICD-10-CM | POA: Insufficient documentation

## 2023-04-06 DIAGNOSIS — R2681 Unsteadiness on feet: Secondary | ICD-10-CM | POA: Diagnosis not present

## 2023-04-06 DIAGNOSIS — R2689 Other abnormalities of gait and mobility: Secondary | ICD-10-CM | POA: Insufficient documentation

## 2023-04-06 DIAGNOSIS — R29898 Other symptoms and signs involving the musculoskeletal system: Secondary | ICD-10-CM | POA: Diagnosis not present

## 2023-04-06 DIAGNOSIS — M6281 Muscle weakness (generalized): Secondary | ICD-10-CM | POA: Diagnosis not present

## 2023-04-06 DIAGNOSIS — M542 Cervicalgia: Secondary | ICD-10-CM | POA: Diagnosis not present

## 2023-04-06 DIAGNOSIS — M62838 Other muscle spasm: Secondary | ICD-10-CM | POA: Diagnosis not present

## 2023-04-12 ENCOUNTER — Ambulatory Visit: Payer: Medicare Other | Attending: Family Medicine

## 2023-04-12 DIAGNOSIS — R293 Abnormal posture: Secondary | ICD-10-CM | POA: Diagnosis not present

## 2023-04-12 DIAGNOSIS — M62838 Other muscle spasm: Secondary | ICD-10-CM | POA: Insufficient documentation

## 2023-04-12 DIAGNOSIS — R2689 Other abnormalities of gait and mobility: Secondary | ICD-10-CM | POA: Insufficient documentation

## 2023-04-12 DIAGNOSIS — M542 Cervicalgia: Secondary | ICD-10-CM | POA: Insufficient documentation

## 2023-04-12 DIAGNOSIS — R2681 Unsteadiness on feet: Secondary | ICD-10-CM | POA: Insufficient documentation

## 2023-04-12 DIAGNOSIS — M5412 Radiculopathy, cervical region: Secondary | ICD-10-CM | POA: Diagnosis not present

## 2023-04-12 DIAGNOSIS — R29898 Other symptoms and signs involving the musculoskeletal system: Secondary | ICD-10-CM | POA: Insufficient documentation

## 2023-04-12 DIAGNOSIS — M6281 Muscle weakness (generalized): Secondary | ICD-10-CM | POA: Diagnosis not present

## 2023-04-12 NOTE — Therapy (Signed)
 OUTPATIENT PHYSICAL THERAPY CERVICAL TREATMENT   Patient Name: Amanda Copeland MRN: [id number] DOB:1940-02-26, 83 y.o., female Today's Date: 04/12/2023   END OF SESSION:  PT End of Session - 04/12/23 1401     Visit Number 2    Date for PT Re-Evaluation 06/01/23    Authorization Type Medicare & BCBS    Progress Note Due on Visit 10    PT Start Time 1357    PT Stop Time 1443    PT Time Calculation (min) 46 min    Activity Tolerance Patient tolerated treatment well;No increased pain    Behavior During Therapy [facility] for tasks assessed/performed              Past Medical History:  Diagnosis Date   Arthritis    Asthma    childhood now returning   Back pain affecting pregnancy 11/02/2014   Benign essential tremor 10/28/2014   Bilateral dry eyes    Breast cancer (HCC)    right   Cancer (HCC)    Breast   Dry eyes    Encounter for Medicare annual wellness exam 05/22/2016   Essential hypertension, benign 10/28/2014   H/O measles    H/O mumps    History of chicken pox    Lumbago 11/02/2014   Mixed hyperlipidemia 10/28/2014   Obesity 10/28/2014   Stenosis of cervical spine    Past Surgical History:  Procedure Laterality Date   BACK SURGERY     BREAST REDUCTION SURGERY Left    CHOLECYSTECTOMY     EYE SURGERY  2008   b/l cataracts removed, in [place]   MASTECTOMY Right    REDUCTION MAMMAPLASTY     REPLACEMENT TOTAL KNEE BILATERAL Bilateral    ROTATOR CUFF REPAIR Left    Patient Active Problem List   Diagnosis Date Noted   Altered mental state 03/17/2023   Chronic bronchitis (HCC) 06/14/2022   Osteopenia 05/25/2021   Scapular dysfunction 03/17/2021   Compression fracture of T11 vertebra (HCC) 01/20/2021   SI joint arthritis (HCC) 01/20/2021   Vitamin D deficiency 01/20/2021   Age-related osteoporosis with current pathological fracture 11/26/2020   Pain of right upper extremity 11/11/2020   Bradycardia 11/11/2020   Neck pain 03/11/2020   Tremor 03/01/2020    Hypothyroid 03/01/2020   Physical deconditioning 11/25/2019   At risk for obstructive sleep apnea 11/25/2019   Primary osteoarthritis of right shoulder 02/28/2019   Other forms of dyspnea 11/22/2018   Educated about COVID-19 virus infection 07/03/2018   Greater trochanteric pain syndrome of right lower extremity 03/18/2018   Chronic nonallergic rhinitis 12/20/2017   Cough 12/20/2017   Right flank pain 12/10/2017   Fatigue 07/12/2017   Obesity, Class III, BMI 40-49.9 (morbid obesity) (HCC) 03/05/2017   Chronic diastolic heart failure (HCC) 11/29/2016   Dyspnea on exertion 10/06/2016   Asthma 10/06/2016   Preventative health care 05/22/2016   Hyperglycemia 08/07/2015   Allergic 08/07/2015   Gout 08/07/2015   Lower abdominal pain 04/21/2015   Low back pain 11/02/2014   Esophageal reflux 10/28/2014   Essential hypertension, benign 10/28/2014   Hyperlipidemia 10/28/2014   Benign essential tremor 10/28/2014   Bilateral dry eyes    Moderate persistent asthma    Arthritis    Cancer (HCC)    History of chicken pox     PCP: Bradd Canary, MD   REFERRING PROVIDER: Sharlene Dory, DO  REFERRING DIAG:  [phone] (ICD-10-CM) - Chronic neck pain  S46.819A (ICD-10-CM) - Strain of trapezius muscle,  unspecified laterality, initial encounter   THERAPY DIAG:  Unsteadiness on feet  Other abnormalities of gait and mobility  Muscle weakness (generalized)  Abnormal posture  Cervicalgia  Radiculopathy, cervical region  Other muscle spasm  Other symptoms and signs involving the musculoskeletal system  RATIONALE FOR EVALUATION AND TREATMENT: Rehabilitation  ONSET DATE: Chronic (neck pain); 03/16/23 - altered mental status and decline in physical functioning  NEXT MD VISIT: 05/29/23 with PCP   SUBJECTIVE:                                                                                                                                                                                                          SUBJECTIVE STATEMENT:   Pt reports everything hurts right now  PAIN: Are you having pain? No  PERTINENT HISTORY:  Chronic neck pain, chronic LBP with h/o surgery for "ruptured disc", lumbar spinal stenosis, OA, B TKA, L RTC repair, chronic diastolic HF, HLD, moderate asthma, DOE, HTN, breast cancer, R mastectomy, benign essential tremor, morbid obesity   PRECAUTIONS: Fall  RED FLAGS: None  HAND DOMINANCE: Right  WEIGHT BEARING RESTRICTIONS: No  FALLS:  Has patient fallen in last 6 months? No - reports "a couple close calls"  LIVING ENVIRONMENT:  Lives with: lives with their spouse Lives in: House/apartment Stairs: Yes: External: 1 steps; none over threshold Has following equipment at home: Single point cane, Environmental consultant - 2 wheeled, Environmental consultant - 4 wheeled, Wheelchair (manual), shower chair, and bed side commode  OCCUPATION: Retired  PLOF: Independent and Leisure: going on cruises, movies, playing cards with friends, swimming in the summer (uses lift to get into pool)     PATIENT GOALS: "To feel like normal again."   OBJECTIVE: (objective measures completed at initial evaluation unless otherwise dated)  DIAGNOSTIC FINDINGS:  03/16/23 - CT Venogram:  IMPRESSION: 1. Negative CT venogram with no evidence for dural venous sinus thrombosis. Previously questioned abnormality on prior MRI felt to be consistent with artifact. 2. No other acute intracranial abnormality. 3. Mild age-related cerebral atrophy with chronic small vessel ischemic disease.  03/16/23 - MR Brain: IMPRESSION: 1. No acute intracranial abnormality. 2. Findings of chronic small vessel ischemia and volume loss. 3. Severely motion degraded MRV. 4. Possible filling defect within the straight sinus and medial transverse sinuses, which could indicate venous sinus thrombosis. CT venogram is recommended.  03/16/23 - CT Head: IMPRESSION: 1. No acute intracranial abnormality. 2. Generalized  atrophy and findings of chronic microvascular disease.  07/07/21 - MR Cervical spine: IMPRESSION: 1. Degenerative changes of the cervical spine with  moderate spinal canal stenosis at C5-6 and C6-7 with mild mass effect on the cord at these levels without cord signal abnormality. 2. Multilevel high-grade neural foraminal narrowing, as described above, severe bilaterally at C5-6.  PATIENT SURVEYS:  ABC scale 870 / 1600 = 54.4 %, 50-80% = moderate level of physical functioning NDI 17 / 50 = 34.0 %, moderate disability   COGNITION: Overall cognitive status: Impaired  SENSATION: WFL  COORDINATION: Movement patterns WFL but slow  POSTURE:  rounded shoulders, forward head, and increased thoracic kyphosis  CERVICAL ROM:   Active ROM 04/12/23  Flexion 46  Extension 41  Right lateral flexion 22  Left lateral flexion 11  Right rotation 52  Left rotation 46   (Blank rows = not tested)  UPPER EXTREMITY ROM: B shoulder limited in overhead ROM d/t OA (Dr. Rennis Chris recommending R TSA)  UPPER EXTREMITY MMT:  MMT Right eval Left eval  Shoulder flexion 3+ 4  Shoulder extension 4 4+  Shoulder abduction 2+ 4  Shoulder adduction    Shoulder internal rotation 4+ 4+  Shoulder external rotation 4 4  Middle trapezius    Lower trapezius    Elbow flexion    Elbow extension    Wrist flexion    Wrist extension    Wrist ulnar deviation    Wrist radial deviation    Wrist pronation    Wrist supination    Grip strength     (Blank rows = not tested)  LOWER EXTREMITY MMT:  (tested in sitting)  MMT Right eval Left eval  Hip flexion 3+ 3+  Hip extension 3- 3-  Hip abduction 4- 4-  Hip adduction 3- 3-  Hip internal rotation    Hip external rotation    Knee flexion 4- 4-  Knee extension 4- 4  Ankle dorsiflexion 4 4  Ankle plantarflexion    Ankle inversion    Ankle eversion     (Blank rows = not tested)  FUNCTIONAL TESTS: (Remaining test to be completed next visit)  5 times sit to  stand: 32.66 sec w/o UE assist Timed up and go (TUG):   10 meter walk test:   Berg Balance Scale: 31/56, < 36 high risk for falls (close to 100%)  Dynamic Gait Index:   MCTSIB: Condition 1: Avg of 3 trials: 30 sec, Condition 2: Avg of 3 trials: 30 sec, Condition 3: Avg of 3 trials: 24 sec, Condition 4: Avg of 3 trials: 18 sec, and Total Score: 102/120   TODAY'S TREATMENT:  04/12/23 Therapeutic Exercise: to improve strength, ROM, flexibility, and endurance  Nustep L3x20min UE/LE Cervical ROM and UE MMT OTAGO program: - Head rotations x 5 -Head movements x 5 -Back movements x 5 -Trunk movements x 5 - Ankle movements x 10 - Front knee strengthening x 10 -back knee strengthening x 10  -Hip abduction x10 -Calf raises x 10 with support - toe raises x 10 with support  04/06/2023 - Eval SELF CARE:  Reviewed eval findings, including high risk for falls and associated precautions to reduce fall risk, and role of PT in addressing identified deficits as well as need for further assessment of gait and dynamic balance.    PATIENT EDUCATION:  Education details: PT eval findings, anticipated POC, and need for further assessment of dynamic balance/gait stability   Person educated: Patient Education method: Explanation Education comprehension: verbalized understanding  HOME EXERCISE PROGRAM: TBD   ASSESSMENT:  CLINICAL IMPRESSION: Cervical ROM and UE strength assessed today. Pt shows pretty  decent cervical ROM, R shoulder is very weak with flexion and abduction. Started OTAGO program with patient giving instruction on safety for dong exercises at home and giving proper form of the exercises. Pt with no complaints, no increased pain from session. Akasia "[name]" will benefit from skilled PT to address above deficits to improve mobility and activity tolerance to help reach the maximal level of functional independence and mobility.    OBJECTIVE IMPAIRMENTS: Abnormal gait, decreased activity tolerance,  decreased balance, decreased knowledge of condition, decreased mobility, difficulty walking, decreased ROM, decreased strength, increased fascial restrictions, impaired perceived functional ability, increased muscle spasms, impaired flexibility, impaired UE functional use, improper body mechanics, postural dysfunction, and pain.   ACTIVITY LIMITATIONS: carrying, lifting, bending, standing, squatting, stairs, transfers, bed mobility, bathing, dressing, reach over head, locomotion level, and caring for others  PARTICIPATION LIMITATIONS: meal prep, cleaning, laundry, shopping, and community activity  PERSONAL FACTORS: Age, Fitness, Past/current experiences, Time since onset of injury/illness/exacerbation, and 3+ comorbidities: Chronic neck pain, chronic LBP with h/o surgery for "ruptured disc", lumbar spinal stenosis, OA, B TKA, L RTC repair, chronic diastolic HF, HLD, moderate asthma, DOE, HTN, breast cancer, R mastectomy, benign essential tremor, morbid obesity  are also affecting patient's functional outcome.   REHAB POTENTIAL: Good  CLINICAL DECISION MAKING: Unstable/unpredictable  EVALUATION COMPLEXITY: High   GOALS: Goals reviewed with patient? Yes  SHORT TERM GOALS: Target date: 05/04/2023  Complete remaining gait and dynamic balance assessment and update LTG's as indicated.  Baseline:  Goal status: IN PROGRESS  2.  Patient will be independent with initial HEP to improve outcomes and carryover.  Baseline:  Goal status: IN PROGRESS  3.  Patient will improve 5x STS time to </= 25 seconds for improved efficiency and safety with transfers. Baseline: 32.66 sec Goal status: IN PROGRESS  LONG TERM GOALS: Target date: 06/01/2023  Patient will be independent with advanced/ongoing HEP to facilitate ability to maintain/progress functional gains from skilled physical therapy services. Baseline:  Goal status: IN PROGRESS  2.  Patient will be able to ambulate 600' with or w/o LRAD on  variable surfaces with good safety to access community.  Baseline:  Goal status: IN PROGRESS  3.  Patient will demonstrate improved B LE strength to >/= 4/5 for improved stability and ease of mobility . Baseline: Refer to above LE MMT table Goal status: IN PROGRESS  4.  Patient will demonstrate gait speed of >/= 1.8 ft/sec (0.55 m/s) to be a safe limited community ambulator with decreased risk for recurrent falls.  Baseline:  Goal status: IN PROGRESS  5.  Patient will improve Berg score to >/= 39/56 to improve safety and stability with ADLs in standing and reduce risk for falls. (MCID= 8 points)  Baseline: 31/56 Goal status: IN PROGRESS  6.  Patient will demonstrate at least 19/24 on DGI to improve gait stability and reduce risk for falls. Baseline:  Goal status: IN PROGRESS  7.  Patient will report >/= 68% on ABC scale to demonstrate improved balance confidence with functional mobility and gait. Baseline: 870 / 1600 = 54.4 % Goal status: IN PROGRESS   PLAN:  PT FREQUENCY: 2x/week  PT DURATION: 8 weeks  PLANNED INTERVENTIONS: 97164- PT Re-evaluation, 97110-Therapeutic exercises, 97530- Therapeutic activity, 97112- Neuromuscular re-education, 97535- Self Care, 16109- Manual therapy, [phone]- Gait training, [phone]- Aquatic Therapy, [phone]- Electrical stimulation (unattended), 97035- Ultrasound, 29562- Ionotophoresis 4mg /ml Dexamethasone, Patient/Family education, Balance training, Stair training, Taping, Dry Needling, Joint mobilization, Spinal mobilization, Cryotherapy, Moist heat, and 97750- Physical  performance test or measurement  PLAN FOR NEXT SESSION: complete balance assessment - 10MWT/gait speed, TUG and DGI; create initial HEP for LE strengthening and balance - possible [address] program    Darleene Cleaver, PTA 04/12/2023, 2:47 PM

## 2023-04-12 NOTE — Progress Notes (Signed)
 HPI: Follow-up coronary artery disease.  Previously followed by Dr. Eldridge Dace but transitioning to me.  Carotid Dopplers May 2019 showed less than 50% bilaterally.  Coronary CTA May 2021 showed minimal nonobstructive coronary disease, calcium score 26 which was 30th percentile.  Echocardiogram May 2021 showed normal LV function, grade 1 diastolic dysfunction.  CPX October 2021 consistent with submaximal effort, mildly reduced functional capacity compared to sedentary controls, ventilatory limitation related to her body habitus but no clear cardiopulmonary limitation and possible chronotropic incompetence.  Since last seen she does have some dyspnea unchanged.  No orthopnea, PND, pedal edema, chest pain or syncope.  Current Outpatient Medications  Medication Sig Dispense Refill   albuterol (VENTOLIN HFA) 108 (90 Base) MCG/ACT inhaler Inhale 2 puffs into the lungs every 4 (four) hours as needed for wheezing or shortness of breath. 1 each 11   allopurinol (ZYLOPRIM) 100 MG tablet TAKE 2 TABLETS BY MOUTH EVERY DAY 180 tablet 1   AMBULATORY NON FORMULARY MEDICATION Rollator  Dx: Gait instability 1 Device 0   Ascorbic Acid (VITAMIN C PO) Take 1 tablet by mouth daily.      aspirin 81 MG tablet Take 81 mg by mouth daily.     Azelastine HCl 137 MCG/SPRAY SOLN Place 1-2 puffs into both nostrils in the morning and at bedtime.     Budeson-Glycopyrrol-Formoterol (BREZTRI AEROSPHERE) 160-9-4.8 MCG/ACT AERO Inhale 2 puffs into the lungs in the morning and at bedtime. 1 each 11   chlorpheniramine-HYDROcodone (TUSSIONEX) 10-8 MG/5ML Take 5 mLs by mouth every 12 (twelve) hours as needed for cough. 115 mL 0   colchicine 0.6 MG tablet Take 2 tabs at first and then 1 tab q 2 hours til pain relief or max of 6 tabs in 24 hours (Patient taking differently: Take 2 tabs at first and then 1 tab q 2 hours til pain relief or max of 6 tabs in 24 hours - PRN for gout flare-up) 6 tablet 1   diclofenac Sodium (PENNSAID) 2 %  SOLN Apply 2 Pump (40 mg total) topically 2 (two) times daily as needed (Pain). 112 g 1   DYMISTA 137-50 MCG/ACT SUSP 1-2 SPRAYS PER NOSTRIL TWICE DAILY FOR RUNNY NOSE 69 g 1   ezetimibe (ZETIA) 10 MG tablet TAKE 1 TABLET BY MOUTH EVERY DAY 90 tablet 1   famotidine (PEPCID) 40 MG tablet TAKE 1 TABLET BY MOUTH EVERYDAY AT BEDTIME 90 tablet 1   fluticasone (FLONASE) 50 MCG/ACT nasal spray Place 1 spray into both nostrils.     KLOR-CON M20 20 MEQ tablet TAKE 1 TABLET BY MOUTH DAILY. KEEP UPCOMING APPOINTMENT 90 tablet 3   levothyroxine (SYNTHROID) 112 MCG tablet TAKE 1 TABLET BY MOUTH EVERY DAY BEFORE BREAKFAST 90 tablet 1   Multiple Vitamins-Minerals (ICAPS AREDS 2) CAPS Take 1 capsule by mouth 2 (two) times daily.     PREVACID 30 MG capsule TAKE 1 CAPSULE BY MOUTH TWICE A DAY --MAX LIMIT 180 capsule 1   propranolol (INDERAL) 40 MG tablet Take 1 tablet (40 mg total) by mouth 2 (two) times daily. 180 tablet 3   Red Yeast Rice Extract (RED YEAST RICE PO) Take by mouth daily.     Budeson-Glycopyrrol-Formoterol (BREZTRI AEROSPHERE) 160-9-4.8 MCG/ACT AERO 2 puffs Inhalation Twice a day (Patient not taking: Reported on 04/25/2023)     furosemide (LASIX) 40 MG tablet TAKE 1 TABLET BY MOUTH EVERY DAY (Patient not taking: Reported on 04/25/2023) 90 tablet 0   No current facility-administered medications for  this visit.     Past Medical History:  Diagnosis Date   Arthritis    Asthma    childhood now returning   Back pain affecting pregnancy 11/02/2014   Benign essential tremor 10/28/2014   Bilateral dry eyes    Breast cancer (HCC)    right   Cancer (HCC)    Breast   Dry eyes    Encounter for Medicare annual wellness exam 05/22/2016   Essential hypertension, benign 10/28/2014   H/O measles    H/O mumps    History of chicken pox    Lumbago 11/02/2014   Mixed hyperlipidemia 10/28/2014   Obesity 10/28/2014   Stenosis of cervical spine     Past Surgical History:  Procedure Laterality Date   BACK  SURGERY     BREAST REDUCTION SURGERY Left    CHOLECYSTECTOMY     EYE SURGERY  2008   b/l cataracts removed, in [place]   MASTECTOMY Right    REDUCTION MAMMAPLASTY     REPLACEMENT TOTAL KNEE BILATERAL Bilateral    ROTATOR CUFF REPAIR Left     Social History   Socioeconomic History   Marital status: Married    Spouse name: Not on file   Number of children: 1   Years of education: Not on file   Highest education level: Master's degree (e.g., MA, MS, MEng, MEd, MSW, MBA)  Occupational History   Occupation: retired    Comment: Runner, 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)  Tobacco Use   Smoking status: Never   Smokeless tobacco: Never  Vaping Use   Vaping status: Never Used  Substance and Sexual Activity   Alcohol use: Yes    Alcohol/week: 1.0 standard drink of alcohol    Types: 1 Cans of beer per week    Comment: two times a month   Drug use: Never   Sexual activity: Not Currently    Comment: lives with husband, moved [place], no dietary restrictions, retired Engineer, site  Other Topics Concern   Not on file  Social History Narrative   Pt lives with Spouse at home 1 daughter   Drinks coffee, and soda   Right handed   Social Drivers of Corporate investment banker Strain: Not on file  Food Insecurity: Not on file  Transportation Needs: Not on file  Physical Activity: Not on file  Stress: Not on file  Social Connections: Not on file  Intimate Partner Violence: Not on file    Family History  Problem Relation Age of Onset   Heart disease Mother    Heart attack Mother    Heart failure Mother    Hypertension Mother    Cancer Father        colon   Asthma Father    Parkinson's disease Father    Cancer Maternal Grandmother        uterine   Heart disease Maternal Grandfather    Obesity Daughter    Appendicitis Paternal Grandmother    Stroke Neg Hx     ROS: no fevers or chills, productive cough, hemoptysis, dysphasia, odynophagia, melena, hematochezia, dysuria, hematuria, rash, seizure  activity, orthopnea, PND, pedal edema, claudication. Remaining systems are negative.  Physical Exam: Well-developed well-nourished in no acute distress.  Skin is warm and dry.  HEENT is normal.  Neck is supple.  Chest is clear to auscultation with normal expansion.  Cardiovascular exam is regular rate and rhythm.  Abdominal exam nontender or distended. No masses palpated. Extremities show no edema. neuro grossly intact  A/P  1 coronary artery disease-mild on previous CTA.  Continue aspirin.  Intolerant to statins.  2 chronic diastolic congestive heart failure-euvolemic on examination.  She is not on a diuretic.  3 hypertension-patient's blood pressure is elevated.  Resume amlodipine 5 mg daily and follow.  4 hyperlipidemia-patient is intolerant to statins.  Continue Zetia.  Refer to lipid clinic for PCSK9 inhibitor.  5 dyspnea-previous CPX did not suggest cardiopulmonary limitation; no obstructive coronary disease on CTA and LV function normal.  Not volume overloaded on examination.  Approximately 15 minutes spent prior to patient arrival reviewing previous records.  Olga Millers, MD

## 2023-04-18 ENCOUNTER — Encounter: Payer: Self-pay | Admitting: Physical Therapy

## 2023-04-18 ENCOUNTER — Ambulatory Visit: Payer: Medicare Other | Admitting: Physical Therapy

## 2023-04-18 DIAGNOSIS — M5412 Radiculopathy, cervical region: Secondary | ICD-10-CM

## 2023-04-18 DIAGNOSIS — M6281 Muscle weakness (generalized): Secondary | ICD-10-CM | POA: Diagnosis not present

## 2023-04-18 DIAGNOSIS — R293 Abnormal posture: Secondary | ICD-10-CM | POA: Diagnosis not present

## 2023-04-18 DIAGNOSIS — R2689 Other abnormalities of gait and mobility: Secondary | ICD-10-CM

## 2023-04-18 DIAGNOSIS — R2681 Unsteadiness on feet: Secondary | ICD-10-CM

## 2023-04-18 DIAGNOSIS — M542 Cervicalgia: Secondary | ICD-10-CM

## 2023-04-18 DIAGNOSIS — M62838 Other muscle spasm: Secondary | ICD-10-CM

## 2023-04-18 DIAGNOSIS — R29898 Other symptoms and signs involving the musculoskeletal system: Secondary | ICD-10-CM

## 2023-04-18 NOTE — Therapy (Signed)
 OUTPATIENT PHYSICAL THERAPY TREATMENT   Patient Name: Amanda Copeland MRN: [id number] DOB:28-Dec-1940, 83 y.o., female Today's Date: 04/18/2023   END OF SESSION:  PT End of Session - 04/18/23 0935     Visit Number 3    Date for PT Re-Evaluation 06/01/23    Authorization Type Medicare & BCBS    Progress Note Due on Visit 10    PT Start Time 0935    PT Stop Time 1018    PT Time Calculation (min) 43 min    Activity Tolerance Patient tolerated treatment well    Behavior During Therapy [facility] for tasks assessed/performed               Past Medical History:  Diagnosis Date   Arthritis    Asthma    childhood now returning   Back pain affecting pregnancy 11/02/2014   Benign essential tremor 10/28/2014   Bilateral dry eyes    Breast cancer (HCC)    right   Cancer (HCC)    Breast   Dry eyes    Encounter for Medicare annual wellness exam 05/22/2016   Essential hypertension, benign 10/28/2014   H/O measles    H/O mumps    History of chicken pox    Lumbago 11/02/2014   Mixed hyperlipidemia 10/28/2014   Obesity 10/28/2014   Stenosis of cervical spine    Past Surgical History:  Procedure Laterality Date   BACK SURGERY     BREAST REDUCTION SURGERY Left    CHOLECYSTECTOMY     EYE SURGERY  2008   b/l cataracts removed, in [place]   MASTECTOMY Right    REDUCTION MAMMAPLASTY     REPLACEMENT TOTAL KNEE BILATERAL Bilateral    ROTATOR CUFF REPAIR Left    Patient Active Problem List   Diagnosis Date Noted   Altered mental state 03/17/2023   Chronic bronchitis (HCC) 06/14/2022   Osteopenia 05/25/2021   Scapular dysfunction 03/17/2021   Compression fracture of T11 vertebra (HCC) 01/20/2021   SI joint arthritis (HCC) 01/20/2021   Vitamin D deficiency 01/20/2021   Age-related osteoporosis with current pathological fracture 11/26/2020   Pain of right upper extremity 11/11/2020   Bradycardia 11/11/2020   Neck pain 03/11/2020   Tremor 03/01/2020   Hypothyroid 03/01/2020   Physical  deconditioning 11/25/2019   At risk for obstructive sleep apnea 11/25/2019   Primary osteoarthritis of right shoulder 02/28/2019   Other forms of dyspnea 11/22/2018   Educated about COVID-19 virus infection 07/03/2018   Greater trochanteric pain syndrome of right lower extremity 03/18/2018   Chronic nonallergic rhinitis 12/20/2017   Cough 12/20/2017   Right flank pain 12/10/2017   Fatigue 07/12/2017   Obesity, Class III, BMI 40-49.9 (morbid obesity) (HCC) 03/05/2017   Chronic diastolic heart failure (HCC) 11/29/2016   Dyspnea on exertion 10/06/2016   Asthma 10/06/2016   Preventative health care 05/22/2016   Hyperglycemia 08/07/2015   Allergic 08/07/2015   Gout 08/07/2015   Lower abdominal pain 04/21/2015   Low back pain 11/02/2014   Esophageal reflux 10/28/2014   Essential hypertension, benign 10/28/2014   Hyperlipidemia 10/28/2014   Benign essential tremor 10/28/2014   Bilateral dry eyes    Moderate persistent asthma    Arthritis    Cancer (HCC)    History of chicken pox     PCP: Bradd Canary, MD   REFERRING PROVIDER: Sharlene Dory, DO  REFERRING DIAG:  [phone] (ICD-10-CM) - Chronic neck pain  S46.819A (ICD-10-CM) - Strain of trapezius muscle, unspecified laterality,  initial encounter   THERAPY DIAG:  Unsteadiness on feet  Other abnormalities of gait and mobility  Muscle weakness (generalized)  Abnormal posture  Cervicalgia  Radiculopathy, cervical region  Other muscle spasm  Other symptoms and signs involving the musculoskeletal system  RATIONALE FOR EVALUATION AND TREATMENT: Rehabilitation  ONSET DATE: Chronic (neck pain); 03/16/23 - altered mental status and decline in physical functioning  NEXT MD VISIT: 05/29/23 with PCP   SUBJECTIVE:                                                                                                                                                                                                          SUBJECTIVE STATEMENT: Pt reports no issues with initial [place] exercises and denies need for review.   PAIN: Are you having pain? No  PERTINENT HISTORY:  Chronic neck pain, chronic LBP with h/o surgery for "ruptured disc", lumbar spinal stenosis, OA, B TKA, L RTC repair, chronic diastolic HF, HLD, moderate asthma, DOE, HTN, breast cancer, R mastectomy, benign essential tremor, morbid obesity   PRECAUTIONS: Fall  RED FLAGS: None  HAND DOMINANCE: Right  WEIGHT BEARING RESTRICTIONS: No  FALLS:  Has patient fallen in last 6 months? No - reports "a couple close calls"  LIVING ENVIRONMENT:  Lives with: lives with their spouse Lives in: House/apartment Stairs: Yes: External: 1 steps; none over threshold Has following equipment at home: Single point cane, Environmental consultant - 2 wheeled, Environmental consultant - 4 wheeled, Wheelchair (manual), shower chair, and bed side commode  OCCUPATION: Retired  PLOF: Independent and Leisure: going on cruises, movies, playing cards with friends, swimming in the summer (uses lift to get into pool)     PATIENT GOALS: "To feel like normal again."   OBJECTIVE: (objective measures completed at initial evaluation unless otherwise dated)  DIAGNOSTIC FINDINGS:  03/16/23 - CT Venogram:  IMPRESSION: 1. Negative CT venogram with no evidence for dural venous sinus thrombosis. Previously questioned abnormality on prior MRI felt to be consistent with artifact. 2. No other acute intracranial abnormality. 3. Mild age-related cerebral atrophy with chronic small vessel ischemic disease.  03/16/23 - MR Brain: IMPRESSION: 1. No acute intracranial abnormality. 2. Findings of chronic small vessel ischemia and volume loss. 3. Severely motion degraded MRV. 4. Possible filling defect within the straight sinus and medial transverse sinuses, which could indicate venous sinus thrombosis. CT venogram is recommended.  03/16/23 - CT Head: IMPRESSION: 1. No acute intracranial abnormality. 2.  Generalized atrophy and findings of chronic microvascular disease.  07/07/21 - MR Cervical spine: IMPRESSION: 1. Degenerative changes of  the cervical spine with moderate spinal canal stenosis at C5-6 and C6-7 with mild mass effect on the cord at these levels without cord signal abnormality. 2. Multilevel high-grade neural foraminal narrowing, as described above, severe bilaterally at C5-6.  PATIENT SURVEYS:  ABC scale 870 / 1600 = 54.4 %, 50-80% = moderate level of physical functioning NDI 17 / 50 = 34.0 %, moderate disability   COGNITION: Overall cognitive status: Impaired  SENSATION: WFL  COORDINATION: Movement patterns WFL but slow  POSTURE:  rounded shoulders, forward head, and increased thoracic kyphosis  CERVICAL ROM:   Active ROM 04/12/23  Flexion 46  Extension 41  Right lateral flexion 22  Left lateral flexion 11  Right rotation 52  Left rotation 46   (Blank rows = not tested)  UPPER EXTREMITY ROM: B shoulder limited in overhead ROM d/t OA (Dr. Rennis Chris recommending R TSA)  UPPER EXTREMITY MMT:  MMT Right 04/12/23 Left 04/12/23  Shoulder flexion 3+ 4  Shoulder extension 4 4+  Shoulder abduction 2+ 4  Shoulder adduction    Shoulder internal rotation 4+ 4+  Shoulder external rotation 4 4  Middle trapezius    Lower trapezius    Elbow flexion    Elbow extension    Wrist flexion    Wrist extension    Wrist ulnar deviation    Wrist radial deviation    Wrist pronation    Wrist supination    Grip strength     (Blank rows = not tested)  LOWER EXTREMITY MMT:  (tested in sitting)  MMT Right eval Left eval  Hip flexion 3+ 3+  Hip extension 3- 3-  Hip abduction 4- 4-  Hip adduction 3- 3-  Hip internal rotation    Hip external rotation    Knee flexion 4- 4-  Knee extension 4- 4  Ankle dorsiflexion 4 4  Ankle plantarflexion    Ankle inversion    Ankle eversion     (Blank rows = not tested)  FUNCTIONAL TESTS: (Remaining test to be completed next visit)  5  times sit to stand: 32.66 sec w/o UE assist Timed up and go (TUG): 24.03 sec w/o AD, >13.5 sec indicates a high risk for falls (04/18/22)  10 meter walk test: 20.25 sec with SPC, 19.81 sec w/o AD; Gait speed: 1.62 ft/sec with SPC, 1.66 ft/sec w/o AD; <1.8 ft/sec indicates risk for recurrent falls (04/18/23)  Sharlene Motts Balance Scale: 31/56, < 36 high risk for falls (close to 100%)  Dynamic Gait Index: 9/24, Scores of 19 or less are predictive of falls in older community living adults (04/18/23)  MCTSIB: Condition 1: Avg of 3 trials: 30 sec, Condition 2: Avg of 3 trials: 30 sec, Condition 3: Avg of 3 trials: 24 sec, Condition 4: Avg of 3 trials: 18 sec, and Total Score: 102/120   TODAY'S TREATMENT:   04/18/23 PHYSICAL PERFORMANCE TEST or MEASUREMENT: TUG: 24.03 sec w/o AD; >13.5 sec indicates a high risk for falls : 20.25 sec with SPC, 19.81 sec w/o AD Gait speed: 1.62 ft/sec with SPC, 1.66 ft/sec w/o AD; <1.8 ft/sec indicates risk for recurrent falls DGI: 9/24, Scores of 19 or less are predictive of falls in older community living adults  Dynamic Gait Index  Level Surface Moderate Impairment   Change in Gait Speed Moderate Impairment   Gait with Horizontal Head Turns Mild Impairment   Gait with Vertical Head Turns Moderate Impairment   Gait and Pivot Turn Mild Impairment   Step Over Obstacle Severe Impairment  Step Around Obstacles Mild Impairment   Steps Severe Impairment   Total Score 9   DGI comment: Scores of 19 or less are predictive of falls in older community living adults      NEUROMUSCULAR RE-EDUCATION: To improve balance, coordination, kinesthesia, proprioception, and reduce fall risk.  [place] Fall Prevention Program: Balance: Knee bends x 10 (hold support) Backwards walking 4 x 10 steps (hold support) Walking and turning around (figure 8 pattern) x 2 Sideways walking 2 x 10 steps each direction (hold support) Heel toe standing (tandem stance) x 10 sec each side (hold  support) Heel toe walking (tandem gait) 2 x 10 steps (hold support) One leg stand x 10 sec each side (hold support) Heel walking - unable Toe walking - unable Heel toe backwards walking - deferred Sit to stands with two hands support x 10 Stair walking - deferred   04/12/23 Therapeutic Exercise: to improve strength, ROM, flexibility, and endurance  Nustep L3x29min UE/LE Cervical ROM and UE MMT [place] program: - Head rotations x 5 -Head movements x 5 -Back movements x 5 -Trunk movements x 5 - Ankle movements x 10 - Front knee strengthening x 10 -back knee strengthening x 10  -Hip abduction x10 -Calf raises x 10 with support - toe raises x 10 with support   04/06/2023 - Eval SELF CARE:  Reviewed eval findings, including high risk for falls and associated precautions to reduce fall risk, and role of PT in addressing identified deficits as well as need for further assessment of gait and dynamic balance.    PATIENT EDUCATION:  Education details: PT eval findings, anticipated POC, and [employer]  Person educated: Patient Education method: Explanation, Demonstration, Verbal cues, and Handouts Education comprehension: verbalized understanding, returned demonstration, verbal cues required, and needs further education  HOME EXERCISE PROGRAM: [place]   ASSESSMENT:  CLINICAL IMPRESSION: Amanda "[name]" reports no concerns with initial [place] HEP and denies need for review today.  Completed standardized balance testing. Examination revealed patient is at risk for falls and functional decline as evidenced by the following objective test measures: Gait speed 1.62 ft/sec with SPC and 1.66 ft/sec w/o AD, (<1.8 ft/sec indicates risk for recurrent falls and 2.62 ft/sec is needed for community access), TUG of 24.03 sec w/o AD (>13.5 sec indicates increased risk for falls), and DGI of 9/24 (scores of 19 or less are predictive of falls in older community living adults).  Completed  training for the remainder of the [place] fall prevention program, focusing on balance activities with UE support and deferring activities unsafe for patient to attempt at home.  [name] will benefit from continued skilled PT to address above deficits to improve mobility and activity tolerance to help reach the maximal level of functional independence and mobility.   OBJECTIVE IMPAIRMENTS: Abnormal gait, decreased activity tolerance, decreased balance, decreased knowledge of condition, decreased mobility, difficulty walking, decreased ROM, decreased strength, increased fascial restrictions, impaired perceived functional ability, increased muscle spasms, impaired flexibility, impaired UE functional use, improper body mechanics, postural dysfunction, and pain.   ACTIVITY LIMITATIONS: carrying, lifting, bending, standing, squatting, stairs, transfers, bed mobility, bathing, dressing, reach over head, locomotion level, and caring for others  PARTICIPATION LIMITATIONS: meal prep, cleaning, laundry, shopping, and community activity  PERSONAL FACTORS: Age, Fitness, Past/current experiences, Time since onset of injury/illness/exacerbation, and 3+ comorbidities: Chronic neck pain, chronic LBP with h/o surgery for "ruptured disc", lumbar spinal stenosis, OA, B TKA, L RTC repair, chronic diastolic HF, HLD, moderate asthma, DOE, HTN, breast  cancer, R mastectomy, benign essential tremor, morbid obesity  are also affecting patient's functional outcome.   REHAB POTENTIAL: Good  CLINICAL DECISION MAKING: Unstable/unpredictable  EVALUATION COMPLEXITY: High   GOALS: Goals reviewed with patient? Yes  SHORT TERM GOALS: Target date: 05/04/2023  Complete remaining gait and dynamic balance assessment and update LTG's as indicated.  Baseline:  Goal status: MET  2.  Patient will be independent with initial HEP to improve outcomes and carryover.  Baseline:  Goal status: IN PROGRESS  3.  Patient will improve 5x STS time  to </= 25 seconds for improved efficiency and safety with transfers. Baseline: 32.66 sec Goal status: IN PROGRESS  LONG TERM GOALS: Target date: 06/01/2023  Patient will be independent with advanced/ongoing HEP to facilitate ability to maintain/progress functional gains from skilled physical therapy services. Baseline:  Goal status: IN PROGRESS  2.  Patient will be able to ambulate 600' with or w/o LRAD on variable surfaces with good safety to access community.  Baseline:  Goal status: IN PROGRESS  3.  Patient will demonstrate improved B LE strength to >/= 4/5 for improved stability and ease of mobility . Baseline: Refer to above LE MMT table Goal status: IN PROGRESS  4.  Patient will demonstrate gait speed of >/= 1.8 ft/sec (0.55 m/s) to be a safe limited community ambulator with decreased risk for recurrent falls.  Baseline: 1.62 ft/sec with SPC, 1.66 ft/sec w/o AD (04/18/23)  Goal status: IN PROGRESS  5.  Patient will improve Berg score to >/= 39/56 to improve safety and stability with ADLs in standing and reduce risk for falls. (MCID= 8 points)  Baseline: 31/56 Goal status: IN PROGRESS  6.  Patient will demonstrate at least 15/24 on DGI to improve gait stability and reduce risk for falls. Baseline: 9/24 (04/18/23)  Goal status: REVISED - 04/18/23  7.  Patient will report >/= 68% on ABC scale to demonstrate improved balance confidence with functional mobility and gait. Baseline: 870 / 1600 = 54.4 % Goal status: IN PROGRESS   PLAN:  PT FREQUENCY: 2x/week  PT DURATION: 8 weeks  PLANNED INTERVENTIONS: 97164- PT Re-evaluation, 97110-Therapeutic exercises, 97530- Therapeutic activity, 97112- Neuromuscular re-education, 97535- Self Care, 81191- Manual therapy, [phone]- Gait training, [phone]- Aquatic Therapy, [phone]- Electrical stimulation (unattended), 97035- Ultrasound, 84696- Ionotophoresis 4mg /ml Dexamethasone, Patient/Family education, Balance training, Stair training, Taping, Dry  Needling, Joint mobilization, Spinal mobilization, Cryotherapy, Moist heat, and 97750- Physical performance test or measurement  PLAN FOR NEXT SESSION: Review [place] program PRN; progress LE strengthening and balance as tolerated; therapeutic exercise and manual therapy as indicated to address cervical pain and ROM concerns   Marry Guan, PT 04/18/2023, 10:20 AM

## 2023-04-19 ENCOUNTER — Other Ambulatory Visit: Payer: Self-pay | Admitting: Family Medicine

## 2023-04-20 ENCOUNTER — Ambulatory Visit: Payer: Medicare Other

## 2023-04-20 DIAGNOSIS — M6281 Muscle weakness (generalized): Secondary | ICD-10-CM | POA: Diagnosis not present

## 2023-04-20 DIAGNOSIS — M62838 Other muscle spasm: Secondary | ICD-10-CM

## 2023-04-20 DIAGNOSIS — R29898 Other symptoms and signs involving the musculoskeletal system: Secondary | ICD-10-CM

## 2023-04-20 DIAGNOSIS — R2681 Unsteadiness on feet: Secondary | ICD-10-CM

## 2023-04-20 DIAGNOSIS — R293 Abnormal posture: Secondary | ICD-10-CM | POA: Diagnosis not present

## 2023-04-20 DIAGNOSIS — M5412 Radiculopathy, cervical region: Secondary | ICD-10-CM

## 2023-04-20 DIAGNOSIS — M542 Cervicalgia: Secondary | ICD-10-CM

## 2023-04-20 DIAGNOSIS — R2689 Other abnormalities of gait and mobility: Secondary | ICD-10-CM | POA: Diagnosis not present

## 2023-04-20 NOTE — Therapy (Signed)
 OUTPATIENT PHYSICAL THERAPY TREATMENT   Patient Name: Amanda Copeland MRN: [id number] DOB:1940/05/28, 83 y.o., female Today's Date: 04/20/2023   END OF SESSION:  PT End of Session - 04/20/23 1526     Visit Number 4    Date for PT Re-Evaluation 06/01/23    Authorization Type Medicare & BCBS    Progress Note Due on Visit 10    PT Start Time 1452    PT Stop Time 1530    PT Time Calculation (min) 38 min    Activity Tolerance Patient tolerated treatment well    Behavior During Therapy [facility] for tasks assessed/performed                Past Medical History:  Diagnosis Date   Arthritis    Asthma    childhood now returning   Back pain affecting pregnancy 11/02/2014   Benign essential tremor 10/28/2014   Bilateral dry eyes    Breast cancer (HCC)    right   Cancer (HCC)    Breast   Dry eyes    Encounter for Medicare annual wellness exam 05/22/2016   Essential hypertension, benign 10/28/2014   H/O measles    H/O mumps    History of chicken pox    Lumbago 11/02/2014   Mixed hyperlipidemia 10/28/2014   Obesity 10/28/2014   Stenosis of cervical spine    Past Surgical History:  Procedure Laterality Date   BACK SURGERY     BREAST REDUCTION SURGERY Left    CHOLECYSTECTOMY     EYE SURGERY  2008   b/l cataracts removed, in [place]   MASTECTOMY Right    REDUCTION MAMMAPLASTY     REPLACEMENT TOTAL KNEE BILATERAL Bilateral    ROTATOR CUFF REPAIR Left    Patient Active Problem List   Diagnosis Date Noted   Altered mental state 03/17/2023   Chronic bronchitis (HCC) 06/14/2022   Osteopenia 05/25/2021   Scapular dysfunction 03/17/2021   Compression fracture of T11 vertebra (HCC) 01/20/2021   SI joint arthritis (HCC) 01/20/2021   Vitamin D deficiency 01/20/2021   Age-related osteoporosis with current pathological fracture 11/26/2020   Pain of right upper extremity 11/11/2020   Bradycardia 11/11/2020   Neck pain 03/11/2020   Tremor 03/01/2020   Hypothyroid 03/01/2020    Physical deconditioning 11/25/2019   At risk for obstructive sleep apnea 11/25/2019   Primary osteoarthritis of right shoulder 02/28/2019   Other forms of dyspnea 11/22/2018   Educated about COVID-19 virus infection 07/03/2018   Greater trochanteric pain syndrome of right lower extremity 03/18/2018   Chronic nonallergic rhinitis 12/20/2017   Cough 12/20/2017   Right flank pain 12/10/2017   Fatigue 07/12/2017   Obesity, Class III, BMI 40-49.9 (morbid obesity) (HCC) 03/05/2017   Chronic diastolic heart failure (HCC) 11/29/2016   Dyspnea on exertion 10/06/2016   Asthma 10/06/2016   Preventative health care 05/22/2016   Hyperglycemia 08/07/2015   Allergic 08/07/2015   Gout 08/07/2015   Lower abdominal pain 04/21/2015   Low back pain 11/02/2014   Esophageal reflux 10/28/2014   Essential hypertension, benign 10/28/2014   Hyperlipidemia 10/28/2014   Benign essential tremor 10/28/2014   Bilateral dry eyes    Moderate persistent asthma    Arthritis    Cancer (HCC)    History of chicken pox     PCP: Bradd Canary, MD   REFERRING PROVIDER: Sharlene Dory, DO  REFERRING DIAG:  [id number] (ICD-10-CM) - Chronic neck pain  S46.819A (ICD-10-CM) - Strain of trapezius muscle, unspecified  laterality, initial encounter   THERAPY DIAG:  Unsteadiness on feet  Other abnormalities of gait and mobility  Muscle weakness (generalized)  Abnormal posture  Cervicalgia  Radiculopathy, cervical region  Other muscle spasm  Other symptoms and signs involving the musculoskeletal system  RATIONALE FOR EVALUATION AND TREATMENT: Rehabilitation  ONSET DATE: Chronic (neck pain); 03/16/23 - altered mental status and decline in physical functioning  NEXT MD VISIT: 05/29/23 with PCP   SUBJECTIVE:                                                                                                                                                                                                          SUBJECTIVE STATEMENT: Pt reports no issues with initial [place] exercises and denies need for review.   PAIN: Are you having pain? No  PERTINENT HISTORY:  Chronic neck pain, chronic LBP with h/o surgery for "ruptured disc", lumbar spinal stenosis, OA, B TKA, L RTC repair, chronic diastolic HF, HLD, moderate asthma, DOE, HTN, breast cancer, R mastectomy, benign essential tremor, morbid obesity   PRECAUTIONS: Fall  RED FLAGS: None  HAND DOMINANCE: Right  WEIGHT BEARING RESTRICTIONS: No  FALLS:  Has patient fallen in last 6 months? No - reports "a couple close calls"  LIVING ENVIRONMENT:  Lives with: lives with their spouse Lives in: House/apartment Stairs: Yes: External: 1 steps; none over threshold Has following equipment at home: Single point cane, Environmental consultant - 2 wheeled, Environmental consultant - 4 wheeled, Wheelchair (manual), shower chair, and bed side commode  OCCUPATION: Retired  PLOF: Independent and Leisure: going on cruises, movies, playing cards with friends, swimming in the summer (uses lift to get into pool)     PATIENT GOALS: "To feel like normal again."   OBJECTIVE: (objective measures completed at initial evaluation unless otherwise dated)  DIAGNOSTIC FINDINGS:  03/16/23 - CT Venogram:  IMPRESSION: 1. Negative CT venogram with no evidence for dural venous sinus thrombosis. Previously questioned abnormality on prior MRI felt to be consistent with artifact. 2. No other acute intracranial abnormality. 3. Mild age-related cerebral atrophy with chronic small vessel ischemic disease.  03/16/23 - MR Brain: IMPRESSION: 1. No acute intracranial abnormality. 2. Findings of chronic small vessel ischemia and volume loss. 3. Severely motion degraded MRV. 4. Possible filling defect within the straight sinus and medial transverse sinuses, which could indicate venous sinus thrombosis. CT venogram is recommended.  03/16/23 - CT Head: IMPRESSION: 1. No acute intracranial abnormality. 2.  Generalized atrophy and findings of chronic microvascular disease.  07/07/21 - MR Cervical spine: IMPRESSION: 1. Degenerative changes  of the cervical spine with moderate spinal canal stenosis at C5-6 and C6-7 with mild mass effect on the cord at these levels without cord signal abnormality. 2. Multilevel high-grade neural foraminal narrowing, as described above, severe bilaterally at C5-6.  PATIENT SURVEYS:  ABC scale 870 / 1600 = 54.4 %, 50-80% = moderate level of physical functioning NDI 17 / 50 = 34.0 %, moderate disability   COGNITION: Overall cognitive status: Impaired  SENSATION: WFL  COORDINATION: Movement patterns WFL but slow  POSTURE:  rounded shoulders, forward head, and increased thoracic kyphosis  CERVICAL ROM:   Active ROM 04/12/23  Flexion 46  Extension 41  Right lateral flexion 22  Left lateral flexion 11  Right rotation 52  Left rotation 46   (Blank rows = not tested)  UPPER EXTREMITY ROM: B shoulder limited in overhead ROM d/t OA (Dr. Rennis Chris recommending R TSA)  UPPER EXTREMITY MMT:  MMT Right 04/12/23 Left 04/12/23  Shoulder flexion 3+ 4  Shoulder extension 4 4+  Shoulder abduction 2+ 4  Shoulder adduction    Shoulder internal rotation 4+ 4+  Shoulder external rotation 4 4  Middle trapezius    Lower trapezius    Elbow flexion    Elbow extension    Wrist flexion    Wrist extension    Wrist ulnar deviation    Wrist radial deviation    Wrist pronation    Wrist supination    Grip strength     (Blank rows = not tested)  LOWER EXTREMITY MMT:  (tested in sitting)  MMT Right eval Left eval  Hip flexion 3+ 3+  Hip extension 3- 3-  Hip abduction 4- 4-  Hip adduction 3- 3-  Hip internal rotation    Hip external rotation    Knee flexion 4- 4-  Knee extension 4- 4  Ankle dorsiflexion 4 4  Ankle plantarflexion    Ankle inversion    Ankle eversion     (Blank rows = not tested)  FUNCTIONAL TESTS: (Remaining test to be completed next visit)  5  times sit to stand: 32.66 sec w/o UE assist Timed up and go (TUG): 24.03 sec w/o AD, >13.5 sec indicates a high risk for falls (04/18/22)  10 meter walk test: 20.25 sec with SPC, 19.81 sec w/o AD; Gait speed: 1.62 ft/sec with SPC, 1.66 ft/sec w/o AD; <1.8 ft/sec indicates risk for recurrent falls (04/18/23)  Sharlene Motts Balance Scale: 31/56, < 36 high risk for falls (close to 100%)  Dynamic Gait Index: 9/24, Scores of 19 or less are predictive of falls in older community living adults (04/18/23)  MCTSIB: Condition 1: Avg of 3 trials: 30 sec, Condition 2: Avg of 3 trials: 30 sec, Condition 3: Avg of 3 trials: 24 sec, Condition 4: Avg of 3 trials: 18 sec, and Total Score: 102/120   TODAY'S TREATMENT:  04/20/23 UBE 4 min - stopped d/t R arm pain With two hand assist: Standing hip abduction 2x 10 B Standing hip extension 2x10 B Standing marches 2x10 B Standing clocks AL,PL, P 5x each; 2 sets Standing walks up ladder x 10 Standing rows RTB x 10  Standing bicep curls RTB x 10 Seated chin tucks, chin tuck + rotation bil  04/18/23 PHYSICAL PERFORMANCE TEST or MEASUREMENT: TUG: 24.03 sec w/o AD; >13.5 sec indicates a high risk for falls : 20.25 sec with SPC, 19.81 sec w/o AD Gait speed: 1.62 ft/sec with SPC, 1.66 ft/sec w/o AD; <1.8 ft/sec indicates risk for recurrent falls DGI: 9/24,  Scores of 19 or less are predictive of falls in older community living adults  Dynamic Gait Index  Level Surface Moderate Impairment   Change in Gait Speed Moderate Impairment   Gait with Horizontal Head Turns Mild Impairment   Gait with Vertical Head Turns Moderate Impairment   Gait and Pivot Turn Mild Impairment   Step Over Obstacle Severe Impairment   Step Around Obstacles Mild Impairment   Steps Severe Impairment   Total Score 9   DGI comment: Scores of 19 or less are predictive of falls in older community living adults      NEUROMUSCULAR RE-EDUCATION: To improve balance, coordination, kinesthesia,  proprioception, and reduce fall risk.  [place] Fall Prevention Program: Balance: Knee bends x 10 (hold support) Backwards walking 4 x 10 steps (hold support) Walking and turning around (figure 8 pattern) x 2 Sideways walking 2 x 10 steps each direction (hold support) Heel toe standing (tandem stance) x 10 sec each side (hold support) Heel toe walking (tandem gait) 2 x 10 steps (hold support) One leg stand x 10 sec each side (hold support) Heel walking - unable Toe walking - unable Heel toe backwards walking - deferred Sit to stands with two hands support x 10 Stair walking - deferred   04/12/23 Therapeutic Exercise: to improve strength, ROM, flexibility, and endurance  Nustep L3x57min UE/LE Cervical ROM and UE MMT [place] program: - Head rotations x 5 -Head movements x 5 -Back movements x 5 -Trunk movements x 5 - Ankle movements x 10 - Front knee strengthening x 10 -back knee strengthening x 10  -Hip abduction x10 -Calf raises x 10 with support - toe raises x 10 with support   04/06/2023 - Eval SELF CARE:  Reviewed eval findings, including high risk for falls and associated precautions to reduce fall risk, and role of PT in addressing identified deficits as well as need for further assessment of gait and dynamic balance.    PATIENT EDUCATION:  Education details: PT eval findings, anticipated POC, and [employer]  Person educated: Patient Education method: Explanation, Demonstration, Verbal cues, and Handouts Education comprehension: verbalized understanding, returned demonstration, verbal cues required, and needs further education  HOME EXERCISE PROGRAM: [place]   ASSESSMENT:  CLINICAL IMPRESSION: Progressed with general balance and strengthening exercises. A couple rest breaks required today. Pt showing mild unsteadiness while unsupported doing rows and bicep curls, CGA given by therapist.  Vanessa Kick will benefit from continued skilled PT to address above  deficits to improve mobility and activity tolerance to help reach the maximal level of functional independence and mobility.   OBJECTIVE IMPAIRMENTS: Abnormal gait, decreased activity tolerance, decreased balance, decreased knowledge of condition, decreased mobility, difficulty walking, decreased ROM, decreased strength, increased fascial restrictions, impaired perceived functional ability, increased muscle spasms, impaired flexibility, impaired UE functional use, improper body mechanics, postural dysfunction, and pain.   ACTIVITY LIMITATIONS: carrying, lifting, bending, standing, squatting, stairs, transfers, bed mobility, bathing, dressing, reach over head, locomotion level, and caring for others  PARTICIPATION LIMITATIONS: meal prep, cleaning, laundry, shopping, and community activity  PERSONAL FACTORS: Age, Fitness, Past/current experiences, Time since onset of injury/illness/exacerbation, and 3+ comorbidities: Chronic neck pain, chronic LBP with h/o surgery for "ruptured disc", lumbar spinal stenosis, OA, B TKA, L RTC repair, chronic diastolic HF, HLD, moderate asthma, DOE, HTN, breast cancer, R mastectomy, benign essential tremor, morbid obesity  are also affecting patient's functional outcome.   REHAB POTENTIAL: Good  CLINICAL DECISION MAKING: Unstable/unpredictable  EVALUATION COMPLEXITY: High  GOALS: Goals reviewed with patient? Yes  SHORT TERM GOALS: Target date: 05/04/2023  Complete remaining gait and dynamic balance assessment and update LTG's as indicated.  Baseline:  Goal status: MET  2.  Patient will be independent with initial HEP to improve outcomes and carryover.  Baseline:  Goal status: IN PROGRESS  3.  Patient will improve 5x STS time to </= 25 seconds for improved efficiency and safety with transfers. Baseline: 32.66 sec Goal status: IN PROGRESS  LONG TERM GOALS: Target date: 06/01/2023  Patient will be independent with advanced/ongoing HEP to facilitate  ability to maintain/progress functional gains from skilled physical therapy services. Baseline:  Goal status: IN PROGRESS  2.  Patient will be able to ambulate 600' with or w/o LRAD on variable surfaces with good safety to access community.  Baseline:  Goal status: IN PROGRESS  3.  Patient will demonstrate improved B LE strength to >/= 4/5 for improved stability and ease of mobility . Baseline: Refer to above LE MMT table Goal status: IN PROGRESS  4.  Patient will demonstrate gait speed of >/= 1.8 ft/sec (0.55 m/s) to be a safe limited community ambulator with decreased risk for recurrent falls.  Baseline: 1.62 ft/sec with SPC, 1.66 ft/sec w/o AD (04/18/23)  Goal status: IN PROGRESS  5.  Patient will improve Berg score to >/= 39/56 to improve safety and stability with ADLs in standing and reduce risk for falls. (MCID= 8 points)  Baseline: 31/56 Goal status: IN PROGRESS  6.  Patient will demonstrate at least 15/24 on DGI to improve gait stability and reduce risk for falls. Baseline: 9/24 (04/18/23)  Goal status: REVISED - 04/18/23  7.  Patient will report >/= 68% on ABC scale to demonstrate improved balance confidence with functional mobility and gait. Baseline: 870 / 1600 = 54.4 % Goal status: IN PROGRESS   PLAN:  PT FREQUENCY: 2x/week  PT DURATION: 8 weeks  PLANNED INTERVENTIONS: 97164- PT Re-evaluation, 97110-Therapeutic exercises, 97530- Therapeutic activity, 97112- Neuromuscular re-education, 97535- Self Care, 16109- Manual therapy, [phone]- Gait training, [phone]- Aquatic Therapy, [phone]- Electrical stimulation (unattended), 97035- Ultrasound, 29562- Ionotophoresis 4mg /ml Dexamethasone, Patient/Family education, Balance training, Stair training, Taping, Dry Needling, Joint mobilization, Spinal mobilization, Cryotherapy, Moist heat, and 97750- Physical performance test or measurement  PLAN FOR NEXT SESSION: Review [place] program PRN; progress LE strengthening and balance as tolerated;  therapeutic exercise and manual therapy as indicated to address cervical pain and ROM concerns   Darleene Cleaver, PTA 04/20/2023, 3:33 PM

## 2023-04-24 ENCOUNTER — Ambulatory Visit: Payer: Medicare Other | Admitting: Physical Therapy

## 2023-04-24 ENCOUNTER — Encounter: Payer: Self-pay | Admitting: Physical Therapy

## 2023-04-24 DIAGNOSIS — M5412 Radiculopathy, cervical region: Secondary | ICD-10-CM | POA: Diagnosis not present

## 2023-04-24 DIAGNOSIS — M542 Cervicalgia: Secondary | ICD-10-CM

## 2023-04-24 DIAGNOSIS — R2689 Other abnormalities of gait and mobility: Secondary | ICD-10-CM

## 2023-04-24 DIAGNOSIS — R2681 Unsteadiness on feet: Secondary | ICD-10-CM

## 2023-04-24 DIAGNOSIS — M62838 Other muscle spasm: Secondary | ICD-10-CM

## 2023-04-24 DIAGNOSIS — M6281 Muscle weakness (generalized): Secondary | ICD-10-CM

## 2023-04-24 DIAGNOSIS — R29898 Other symptoms and signs involving the musculoskeletal system: Secondary | ICD-10-CM

## 2023-04-24 DIAGNOSIS — R293 Abnormal posture: Secondary | ICD-10-CM

## 2023-04-24 NOTE — Therapy (Signed)
 OUTPATIENT PHYSICAL THERAPY TREATMENT   Patient Name: Amanda Copeland MRN: [id number] DOB:[birth year], 83 y.o., female Today's Date: 04/24/2023   END OF SESSION:  PT End of Session - 04/24/23 1147     Visit Number 5    Date for PT Re-Evaluation 06/01/23    Authorization Type Medicare & BCBS    Progress Note Due on Visit 10    PT Start Time 1147    PT Stop Time 1229    PT Time Calculation (min) 42 min    Activity Tolerance Patient tolerated treatment well    Behavior During Therapy [facility] for tasks assessed/performed                 Past Medical History:  Diagnosis Date   Arthritis    Asthma    childhood now returning   Back pain affecting pregnancy 11/02/2014   Benign essential tremor 10/28/2014   Bilateral dry eyes    Breast cancer (HCC)    right   Cancer (HCC)    Breast   Dry eyes    Encounter for Medicare annual wellness exam 05/22/2016   Essential hypertension, benign 10/28/2014   H/O measles    H/O mumps    History of chicken pox    Lumbago 11/02/2014   Mixed hyperlipidemia 10/28/2014   Obesity 10/28/2014   Stenosis of cervical spine    Past Surgical History:  Procedure Laterality Date   BACK SURGERY     BREAST REDUCTION SURGERY Left    CHOLECYSTECTOMY     EYE SURGERY  2008   b/l cataracts removed, in [place]   MASTECTOMY Right    REDUCTION MAMMAPLASTY     REPLACEMENT TOTAL KNEE BILATERAL Bilateral    ROTATOR CUFF REPAIR Left    Patient Active Problem List   Diagnosis Date Noted   Altered mental state 03/17/2023   Chronic bronchitis (HCC) 06/14/2022   Osteopenia 05/25/2021   Scapular dysfunction 03/17/2021   Compression fracture of T11 vertebra (HCC) 01/20/2021   SI joint arthritis (HCC) 01/20/2021   Vitamin D deficiency 01/20/2021   Age-related osteoporosis with current pathological fracture 11/26/2020   Pain of right upper extremity 11/11/2020   Bradycardia 11/11/2020   Neck pain 03/11/2020   Tremor 03/01/2020   Hypothyroid 03/01/2020    Physical deconditioning 11/25/2019   At risk for obstructive sleep apnea 11/25/2019   Primary osteoarthritis of right shoulder 02/28/2019   Other forms of dyspnea 11/22/2018   Educated about COVID-19 virus infection 07/03/2018   Greater trochanteric pain syndrome of right lower extremity 03/18/2018   Chronic nonallergic rhinitis 12/20/2017   Cough 12/20/2017   Right flank pain 12/10/2017   Fatigue 07/12/2017   Obesity, Class III, BMI 40-49.9 (morbid obesity) (HCC) 03/05/2017   Chronic diastolic heart failure (HCC) 11/29/2016   Dyspnea on exertion 10/06/2016   Asthma 10/06/2016   Preventative health care 05/22/2016   Hyperglycemia 08/07/2015   Allergic 08/07/2015   Gout 08/07/2015   Lower abdominal pain 04/21/2015   Low back pain 11/02/2014   Esophageal reflux 10/28/2014   Essential hypertension, benign 10/28/2014   Hyperlipidemia 10/28/2014   Benign essential tremor 10/28/2014   Bilateral dry eyes    Moderate persistent asthma    Arthritis    Cancer (HCC)    History of chicken pox     PCP: Bradd Canary, MD   REFERRING PROVIDER: Sharlene Dory, DO  REFERRING DIAG:  [phone] (ICD-10-CM) - Chronic neck pain  S46.819A (ICD-10-CM) - Strain of trapezius muscle,  unspecified laterality, initial encounter   THERAPY DIAG:  Unsteadiness on feet  Other abnormalities of gait and mobility  Muscle weakness (generalized)  Abnormal posture  Cervicalgia  Radiculopathy, cervical region  Other muscle spasm  Other symptoms and signs involving the musculoskeletal system  RATIONALE FOR EVALUATION AND TREATMENT: Rehabilitation  ONSET DATE: Chronic (neck pain); 03/16/23 - altered mental status and decline in physical functioning  NEXT MD VISIT: 05/29/23 with PCP   SUBJECTIVE:                                                                                                                                                                                                          SUBJECTIVE STATEMENT: Pt reports her pain comes and goes.  Feeling more worn out this morning but was "running back and forth" in her house going between watching the trees being trimmed and the people cleaning her house.   PAIN: Are you having pain? No and Yes: NPRS scale: 0/10; up to 10/10  Pain location: R anterior upper arm  Pain description: sharp  Aggravating factors: unpredictable, sometimes lifting arm  Relieving factors: rest   PERTINENT HISTORY:  Chronic neck pain, chronic LBP with h/o surgery for "ruptured disc", lumbar spinal stenosis, OA, B TKA, L RTC repair, chronic diastolic HF, HLD, moderate asthma, DOE, HTN, breast cancer, R mastectomy, benign essential tremor, morbid obesity   PRECAUTIONS: Fall  RED FLAGS: None  HAND DOMINANCE: Right  WEIGHT BEARING RESTRICTIONS: No  FALLS:  Has patient fallen in last 6 months? No - reports "a couple close calls"  LIVING ENVIRONMENT:  Lives with: lives with their spouse Lives in: House/apartment Stairs: Yes: External: 1 steps; none over threshold Has following equipment at home: Single point cane, Environmental consultant - 2 wheeled, Environmental consultant - 4 wheeled, Wheelchair (manual), shower chair, and bed side commode  OCCUPATION: Retired  PLOF: Independent and Leisure: going on cruises, movies, playing cards with friends, swimming in the summer (uses lift to get into pool)     PATIENT GOALS: "To feel like normal again."   OBJECTIVE: (objective measures completed at initial evaluation unless otherwise dated)  DIAGNOSTIC FINDINGS:  03/16/23 - CT Venogram:  IMPRESSION: 1. Negative CT venogram with no evidence for dural venous sinus thrombosis. Previously questioned abnormality on prior MRI felt to be consistent with artifact. 2. No other acute intracranial abnormality. 3. Mild age-related cerebral atrophy with chronic small vessel ischemic disease.  03/16/23 - MR Brain: IMPRESSION: 1. No acute intracranial abnormality. 2. Findings of  chronic small vessel ischemia and volume loss. 3. Severely  motion degraded MRV. 4. Possible filling defect within the straight sinus and medial transverse sinuses, which could indicate venous sinus thrombosis. CT venogram is recommended.  03/16/23 - CT Head: IMPRESSION: 1. No acute intracranial abnormality. 2. Generalized atrophy and findings of chronic microvascular disease.  07/07/21 - MR Cervical spine: IMPRESSION: 1. Degenerative changes of the cervical spine with moderate spinal canal stenosis at C5-6 and C6-7 with mild mass effect on the cord at these levels without cord signal abnormality. 2. Multilevel high-grade neural foraminal narrowing, as described above, severe bilaterally at C5-6.  PATIENT SURVEYS:  ABC scale 870 / 1600 = 54.4 %, 50-80% = moderate level of physical functioning NDI 17 / 50 = 34.0 %, moderate disability   COGNITION: Overall cognitive status: Impaired  SENSATION: WFL  COORDINATION: Movement patterns WFL but slow  POSTURE:  rounded shoulders, forward head, and increased thoracic kyphosis  CERVICAL ROM:   Active ROM 04/12/23  Flexion 46  Extension 41  Right lateral flexion 22  Left lateral flexion 11  Right rotation 52  Left rotation 46   (Blank rows = not tested)  UPPER EXTREMITY ROM: B shoulder limited in overhead ROM d/t OA (Dr. Rennis Chris recommending R TSA)  UPPER EXTREMITY MMT:  MMT Right 04/12/23 Left 04/12/23  Shoulder flexion 3+ 4  Shoulder extension 4 4+  Shoulder abduction 2+ 4  Shoulder adduction    Shoulder internal rotation 4+ 4+  Shoulder external rotation 4 4  Middle trapezius    Lower trapezius    Elbow flexion    Elbow extension    Wrist flexion    Wrist extension    Wrist ulnar deviation    Wrist radial deviation    Wrist pronation    Wrist supination    Grip strength     (Blank rows = not tested)  LOWER EXTREMITY MMT:  (tested in sitting)  MMT Right eval Left eval  Hip flexion 3+ 3+  Hip extension 3- 3-  Hip  abduction 4- 4-  Hip adduction 3- 3-  Hip internal rotation    Hip external rotation    Knee flexion 4- 4-  Knee extension 4- 4  Ankle dorsiflexion 4 4  Ankle plantarflexion    Ankle inversion    Ankle eversion     (Blank rows = not tested)  FUNCTIONAL TESTS: (Remaining test to be completed next visit)  5 times sit to stand: 32.66 sec w/o UE assist Timed up and go (TUG): 24.03 sec w/o AD, >13.5 sec indicates a high risk for falls (04/18/22)  10 meter walk test: 20.25 sec with SPC, 19.81 sec w/o AD; Gait speed: 1.62 ft/sec with SPC, 1.66 ft/sec w/o AD; <1.8 ft/sec indicates risk for recurrent falls (04/18/23)  Sharlene Motts Balance Scale: 31/56, < 36 high risk for falls (close to 100%)  Dynamic Gait Index: 9/24, Scores of 19 or less are predictive of falls in older community living adults (04/18/23)  MCTSIB: Condition 1: Avg of 3 trials: 30 sec, Condition 2: Avg of 3 trials: 30 sec, Condition 3: Avg of 3 trials: 24 sec, Condition 4: Avg of 3 trials: 18 sec, and Total Score: 102/120   TODAY'S TREATMENT:   04/24/23 THERAPEUTIC EXERCISE: To improve strength and endurance.  Demonstration, verbal and tactile cues throughout for technique.  NuStep - L4 x 7 min (L UE/B LE) Seated RTB B hip ABD/ER clam 2 x 10 Seated RTB hip flexion march 2 x 10  NEUROMUSCULAR RE-EDUCATION: To improve balance, coordination, kinesthesia, posture, proprioception, and  reduce fall risk.  Sit to stand from Airex pad on mat table + RTB hip ABD isometric (no UE assist) 2 x 5 Standing mini-squat with glute touch to Airex pad on mat table + RTB hip ABD isometric 2 x 5, UE support on back of chair Standing hip extension with looped RTB at distal thighs 2 x 5 bil Standing hip abduction with looped RTB at distal thighs x 10 bil   04/20/23 UBE 4 min - stopped d/t R arm pain With two hand assist: Standing hip abduction 2x 10 B Standing hip extension 2x10 B Standing marches 2x10 B Standing clocks AL,PL, P 5x each; 2  sets Standing walks up ladder x 10 Standing rows RTB x 10  Standing bicep curls RTB x 10 Seated chin tucks, chin tuck + rotation bil   04/18/23 PHYSICAL PERFORMANCE TEST or MEASUREMENT: TUG: 24.03 sec w/o AD; >13.5 sec indicates a high risk for falls : 20.25 sec with SPC, 19.81 sec w/o AD Gait speed: 1.62 ft/sec with SPC, 1.66 ft/sec w/o AD; <1.8 ft/sec indicates risk for recurrent falls DGI: 9/24, Scores of 19 or less are predictive of falls in older community living adults  Dynamic Gait Index  Level Surface Moderate Impairment   Change in Gait Speed Moderate Impairment   Gait with Horizontal Head Turns Mild Impairment   Gait with Vertical Head Turns Moderate Impairment   Gait and Pivot Turn Mild Impairment   Step Over Obstacle Severe Impairment   Step Around Obstacles Mild Impairment   Steps Severe Impairment   Total Score 9   DGI comment: Scores of 19 or less are predictive of falls in older community living adults      NEUROMUSCULAR RE-EDUCATION: To improve balance, coordination, kinesthesia, proprioception, and reduce fall risk.  [place] Fall Prevention Program: Balance: Knee bends x 10 (hold support) Backwards walking 4 x 10 steps (hold support) Walking and turning around (figure 8 pattern) x 2 Sideways walking 2 x 10 steps each direction (hold support) Heel toe standing (tandem stance) x 10 sec each side (hold support) Heel toe walking (tandem gait) 2 x 10 steps (hold support) One leg stand x 10 sec each side (hold support) Heel walking - unable Toe walking - unable Heel toe backwards walking - deferred Sit to stands with two hands support x 10 Stair walking - deferred   PATIENT EDUCATION:  Education details: continue with current HEP and [employer]  Person educated: Patient Education method: Explanation Education comprehension: verbalized understanding  HOME EXERCISE PROGRAM: [place]   ASSESSMENT:  CLINICAL IMPRESSION: Amanda "[name]"  reports increased fatigue from "running back-and-forth" in her home this morning.  She reports that [place] home exercise program is going well and denies need for any review today.  She continues to struggle with sit to stand requiring significant UE support, therefore focused on proximal LE strengthening with emphasis on glutes to increase ease of transitional mobility.  With Thera-Band added for hip abduction isometric patient able to achieve unassisted sit to stand from elevated seat height (Airex pad on mat table).  [name] will benefit from continued skilled PT to address ongoing deficits to improve mobility and activity tolerance to help reach the maximal level of functional independence and mobility.   OBJECTIVE IMPAIRMENTS: Abnormal gait, decreased activity tolerance, decreased balance, decreased knowledge of condition, decreased mobility, difficulty walking, decreased ROM, decreased strength, increased fascial restrictions, impaired perceived functional ability, increased muscle spasms, impaired flexibility, impaired UE functional use, improper body mechanics, postural  dysfunction, and pain.   ACTIVITY LIMITATIONS: carrying, lifting, bending, standing, squatting, stairs, transfers, bed mobility, bathing, dressing, reach over head, locomotion level, and caring for others  PARTICIPATION LIMITATIONS: meal prep, cleaning, laundry, shopping, and community activity  PERSONAL FACTORS: Age, Fitness, Past/current experiences, Time since onset of injury/illness/exacerbation, and 3+ comorbidities: Chronic neck pain, chronic LBP with h/o surgery for "ruptured disc", lumbar spinal stenosis, OA, B TKA, L RTC repair, chronic diastolic HF, HLD, moderate asthma, DOE, HTN, breast cancer, R mastectomy, benign essential tremor, morbid obesity  are also affecting patient's functional outcome.   REHAB POTENTIAL: Good  CLINICAL DECISION MAKING: Unstable/unpredictable  EVALUATION COMPLEXITY: High   GOALS: Goals  reviewed with patient? Yes  SHORT TERM GOALS: Target date: 05/04/2023  Complete remaining gait and dynamic balance assessment and update LTG's as indicated.  Baseline:  Goal status: MET - 04/18/23  2.  Patient will be independent with initial HEP to improve outcomes and carryover.  Baseline:  Goal status: IN PROGRESS   3.  Patient will improve 5x STS time to </= 25 seconds for improved efficiency and safety with transfers. Baseline: 32.66 sec Goal status: IN PROGRESS  LONG TERM GOALS: Target date: 06/01/2023  Patient will be independent with advanced/ongoing HEP to facilitate ability to maintain/progress functional gains from skilled physical therapy services. Baseline:  Goal status: IN PROGRESS  2.  Patient will be able to ambulate 600' with or w/o LRAD on variable surfaces with good safety to access community.  Baseline:  Goal status: IN PROGRESS  3.  Patient will demonstrate improved B LE strength to >/= 4/5 for improved stability and ease of mobility . Baseline: Refer to above LE MMT table Goal status: IN PROGRESS  4.  Patient will demonstrate gait speed of >/= 1.8 ft/sec (0.55 m/s) to be a safe limited community ambulator with decreased risk for recurrent falls.  Baseline: 1.62 ft/sec with SPC, 1.66 ft/sec w/o AD (04/18/23)  Goal status: IN PROGRESS  5.  Patient will improve Berg score to >/= 39/56 to improve safety and stability with ADLs in standing and reduce risk for falls. (MCID= 8 points)  Baseline: 31/56 Goal status: IN PROGRESS  6.  Patient will demonstrate at least 15/24 on DGI to improve gait stability and reduce risk for falls. Baseline: 9/24 (04/18/23)  Goal status: REVISED - 04/18/23  7.  Patient will report >/= 68% on ABC scale to demonstrate improved balance confidence with functional mobility and gait. Baseline: 870 / 1600 = 54.4 % Goal status: IN PROGRESS   PLAN:  PT FREQUENCY: 2x/week  PT DURATION: 8 weeks  PLANNED INTERVENTIONS: 97164- PT  Re-evaluation, 97110-Therapeutic exercises, 97530- Therapeutic activity, 97112- Neuromuscular re-education, 97535- Self Care, 16109- Manual therapy, [phone]- Gait training, [phone]- Aquatic Therapy, [phone]- Electrical stimulation (unattended), 97035- Ultrasound, 29562- Ionotophoresis 4mg /ml Dexamethasone, Patient/Family education, Balance training, Stair training, Taping, Dry Needling, Joint mobilization, Spinal mobilization, Cryotherapy, Moist heat, and 97750- Physical performance test or measurement  PLAN FOR NEXT SESSION: Review [place] program PRN; progress LE strengthening and balance as tolerated; therapeutic exercise and manual therapy as indicated to address cervical pain and ROM concerns   Marry Guan, PT 04/24/2023, 7:54 PM

## 2023-04-25 ENCOUNTER — Ambulatory Visit: Payer: Medicare Other | Attending: Cardiology | Admitting: Cardiology

## 2023-04-25 ENCOUNTER — Encounter: Payer: Self-pay | Admitting: Cardiology

## 2023-04-25 VITALS — BP 172/78 | HR 56 | Ht 60.0 in | Wt 238.0 lb

## 2023-04-25 DIAGNOSIS — I110 Hypertensive heart disease with heart failure: Secondary | ICD-10-CM | POA: Diagnosis not present

## 2023-04-25 DIAGNOSIS — E782 Mixed hyperlipidemia: Secondary | ICD-10-CM

## 2023-04-25 DIAGNOSIS — I5032 Chronic diastolic (congestive) heart failure: Secondary | ICD-10-CM

## 2023-04-25 DIAGNOSIS — I2510 Atherosclerotic heart disease of native coronary artery without angina pectoris: Secondary | ICD-10-CM | POA: Diagnosis not present

## 2023-04-25 MED ORDER — AMLODIPINE BESYLATE 5 MG PO TABS: 5.0000 mg | ORAL_TABLET | Freq: Every day | ORAL | 3 refills | Status: AC

## 2023-04-25 NOTE — Patient Instructions (Signed)
 Medication Instructions:   START AMLODIPINE 5 MG ONCE DAILY  *If you need a refill on your cardiac medications before your next appointment, please call your pharmacy*   Follow-Up: At [facility], you and your health needs are our priority.  As part of our continuing mission to provide you with exceptional heart care, we have created designated Provider Care Teams.  These Care Teams include your primary Cardiologist (physician) and Advanced Practice Providers (APPs -  Physician Assistants and Nurse Practitioners) who all work together to provide you with the care you need, when you need it.  Your next appointment:   12 month(s)  Provider:   Olga Millers, MD

## 2023-04-27 ENCOUNTER — Ambulatory Visit: Payer: Medicare Other | Admitting: Physical Therapy

## 2023-04-27 ENCOUNTER — Encounter: Payer: Self-pay | Admitting: Physical Therapy

## 2023-04-27 DIAGNOSIS — M5412 Radiculopathy, cervical region: Secondary | ICD-10-CM

## 2023-04-27 DIAGNOSIS — R293 Abnormal posture: Secondary | ICD-10-CM | POA: Diagnosis not present

## 2023-04-27 DIAGNOSIS — R2681 Unsteadiness on feet: Secondary | ICD-10-CM | POA: Diagnosis not present

## 2023-04-27 DIAGNOSIS — R2689 Other abnormalities of gait and mobility: Secondary | ICD-10-CM

## 2023-04-27 DIAGNOSIS — M542 Cervicalgia: Secondary | ICD-10-CM

## 2023-04-27 DIAGNOSIS — M6281 Muscle weakness (generalized): Secondary | ICD-10-CM

## 2023-04-27 DIAGNOSIS — M62838 Other muscle spasm: Secondary | ICD-10-CM

## 2023-04-27 NOTE — Therapy (Signed)
 OUTPATIENT PHYSICAL THERAPY TREATMENT   Patient Name: Amanda Copeland MRN: [id number] DOB:1940-03-22, 83 y.o., female Today's Date: 04/27/2023   END OF SESSION:  PT End of Session - 04/27/23 1018     Visit Number 6    Date for PT Re-Evaluation 06/01/23    Authorization Type Medicare & BCBS    Progress Note Due on Visit 10    PT Start Time 1018    PT Stop Time 1058    PT Time Calculation (min) 40 min    Activity Tolerance Patient tolerated treatment well    Behavior During Therapy [facility] for tasks assessed/performed                  Past Medical History:  Diagnosis Date   Arthritis    Asthma    childhood now returning   Back pain affecting pregnancy 11/02/2014   Benign essential tremor 10/28/2014   Bilateral dry eyes    Breast cancer (HCC)    right   Cancer (HCC)    Breast   Dry eyes    Encounter for Medicare annual wellness exam 05/22/2016   Essential hypertension, benign 10/28/2014   H/O measles    H/O mumps    History of chicken pox    Lumbago 11/02/2014   Mixed hyperlipidemia 10/28/2014   Obesity 10/28/2014   Stenosis of cervical spine    Past Surgical History:  Procedure Laterality Date   BACK SURGERY     BREAST REDUCTION SURGERY Left    CHOLECYSTECTOMY     EYE SURGERY  2008   b/l cataracts removed, in [place]   MASTECTOMY Right    REDUCTION MAMMAPLASTY     REPLACEMENT TOTAL KNEE BILATERAL Bilateral    ROTATOR CUFF REPAIR Left    Patient Active Problem List   Diagnosis Date Noted   Altered mental state 03/17/2023   Chronic bronchitis (HCC) 06/14/2022   Osteopenia 05/25/2021   Scapular dysfunction 03/17/2021   Compression fracture of T11 vertebra (HCC) 01/20/2021   SI joint arthritis (HCC) 01/20/2021   Vitamin D deficiency 01/20/2021   Age-related osteoporosis with current pathological fracture 11/26/2020   Pain of right upper extremity 11/11/2020   Bradycardia 11/11/2020   Neck pain 03/11/2020   Tremor 03/01/2020   Hypothyroid 03/01/2020    Physical deconditioning 11/25/2019   At risk for obstructive sleep apnea 11/25/2019   Primary osteoarthritis of right shoulder 02/28/2019   Other forms of dyspnea 11/22/2018   Educated about COVID-19 virus infection 07/03/2018   Greater trochanteric pain syndrome of right lower extremity 03/18/2018   Chronic nonallergic rhinitis 12/20/2017   Cough 12/20/2017   Right flank pain 12/10/2017   Fatigue 07/12/2017   Obesity, Class III, BMI 40-49.9 (morbid obesity) (HCC) 03/05/2017   Chronic diastolic heart failure (HCC) 11/29/2016   Dyspnea on exertion 10/06/2016   Asthma 10/06/2016   Preventative health care 05/22/2016   Hyperglycemia 08/07/2015   Allergic 08/07/2015   Gout 08/07/2015   Lower abdominal pain 04/21/2015   Low back pain 11/02/2014   Esophageal reflux 10/28/2014   Essential hypertension, benign 10/28/2014   Hyperlipidemia 10/28/2014   Benign essential tremor 10/28/2014   Bilateral dry eyes    Moderate persistent asthma    Arthritis    Cancer (HCC)    History of chicken pox     PCP: Bradd Canary, MD   REFERRING PROVIDER: Sharlene Dory, DO  REFERRING DIAG:  [id number] (ICD-10-CM) - Chronic neck pain  S46.819A (ICD-10-CM) - Strain of trapezius  muscle, unspecified laterality, initial encounter   THERAPY DIAG:  Unsteadiness on feet  Other abnormalities of gait and mobility  Muscle weakness (generalized)  Abnormal posture  Cervicalgia  Radiculopathy, cervical region  Other muscle spasm  RATIONALE FOR EVALUATION AND TREATMENT: Rehabilitation  ONSET DATE: Chronic (neck pain); 03/16/23 - altered mental status and decline in physical functioning  NEXT MD VISIT: 05/29/23 with PCP   SUBJECTIVE:                                                                                                                                                                                                         SUBJECTIVE STATEMENT: Pt reports notes her tremor is  starting to affect her mouth - her teeth clang against her glass as she id drinking. She reports her arm was bothering her more yesterday.  She notes she was determined to be active yesterday - feels more worn out today.Marland Kitchen  PAIN: Are you having pain? No and Yes: NPRS scale: 0/10; up to 10/10  Pain location: R anterior upper arm  Pain description: sharp  Aggravating factors: unpredictable, sometimes lifting arm  Relieving factors: rest   PERTINENT HISTORY:  Chronic neck pain, chronic LBP with h/o surgery for "ruptured disc", lumbar spinal stenosis, OA, B TKA, L RTC repair, chronic diastolic HF, HLD, moderate asthma, DOE, HTN, breast cancer, R mastectomy, benign essential tremor, morbid obesity   PRECAUTIONS: Fall  RED FLAGS: None  HAND DOMINANCE: Right  WEIGHT BEARING RESTRICTIONS: No  FALLS:  Has patient fallen in last 6 months? No - reports "a couple close calls"  LIVING ENVIRONMENT:  Lives with: lives with their spouse Lives in: House/apartment Stairs: Yes: External: 1 steps; none over threshold Has following equipment at home: Single point cane, Environmental consultant - 2 wheeled, Environmental consultant - 4 wheeled, Wheelchair (manual), shower chair, and bed side commode  OCCUPATION: Retired  PLOF: Independent and Leisure: going on cruises, movies, playing cards with friends, swimming in the summer (uses lift to get into pool)     PATIENT GOALS: "To feel like normal again."   OBJECTIVE: (objective measures completed at initial evaluation unless otherwise dated)  DIAGNOSTIC FINDINGS:  03/16/23 - CT Venogram:  IMPRESSION: 1. Negative CT venogram with no evidence for dural venous sinus thrombosis. Previously questioned abnormality on prior MRI felt to be consistent with artifact. 2. No other acute intracranial abnormality. 3. Mild age-related cerebral atrophy with chronic small vessel ischemic disease.  03/16/23 - MR Brain: IMPRESSION: 1. No acute intracranial abnormality. 2. Findings of chronic small  vessel ischemia and volume loss.  3. Severely motion degraded MRV. 4. Possible filling defect within the straight sinus and medial transverse sinuses, which could indicate venous sinus thrombosis. CT venogram is recommended.  03/16/23 - CT Head: IMPRESSION: 1. No acute intracranial abnormality. 2. Generalized atrophy and findings of chronic microvascular disease.  07/07/21 - MR Cervical spine: IMPRESSION: 1. Degenerative changes of the cervical spine with moderate spinal canal stenosis at C5-6 and C6-7 with mild mass effect on the cord at these levels without cord signal abnormality. 2. Multilevel high-grade neural foraminal narrowing, as described above, severe bilaterally at C5-6.  PATIENT SURVEYS:  ABC scale 870 / 1600 = 54.4 %, 50-80% = moderate level of physical functioning NDI 17 / 50 = 34.0 %, moderate disability   COGNITION: Overall cognitive status: Impaired  SENSATION: WFL  COORDINATION: Movement patterns WFL but slow  POSTURE:  rounded shoulders, forward head, and increased thoracic kyphosis  CERVICAL ROM:   Active ROM 04/12/23  Flexion 46  Extension 41  Right lateral flexion 22  Left lateral flexion 11  Right rotation 52  Left rotation 46   (Blank rows = not tested)  UPPER EXTREMITY ROM: B shoulder limited in overhead ROM d/t OA (Dr. Rennis Chris recommending R TSA)  UPPER EXTREMITY MMT:  MMT Right 04/12/23 Left 04/12/23  Shoulder flexion 3+ 4  Shoulder extension 4 4+  Shoulder abduction 2+ 4  Shoulder adduction    Shoulder internal rotation 4+ 4+  Shoulder external rotation 4 4  Middle trapezius    Lower trapezius    Elbow flexion    Elbow extension    Wrist flexion    Wrist extension    Wrist ulnar deviation    Wrist radial deviation    Wrist pronation    Wrist supination    Grip strength     (Blank rows = not tested)  LOWER EXTREMITY MMT:  (tested in sitting)  MMT Right eval Left eval  Hip flexion 3+ 3+  Hip extension 3- 3-  Hip abduction 4- 4-   Hip adduction 3- 3-  Hip internal rotation    Hip external rotation    Knee flexion 4- 4-  Knee extension 4- 4  Ankle dorsiflexion 4 4  Ankle plantarflexion    Ankle inversion    Ankle eversion     (Blank rows = not tested)  FUNCTIONAL TESTS: (Remaining test to be completed next visit)  5 times sit to stand: 32.66 sec w/o UE assist Timed up and go (TUG): 24.03 sec w/o AD, >13.5 sec indicates a high risk for falls (04/18/22)  10 meter walk test: 20.25 sec with SPC, 19.81 sec w/o AD; Gait speed: 1.62 ft/sec with SPC, 1.66 ft/sec w/o AD; <1.8 ft/sec indicates risk for recurrent falls (04/18/23)  Sharlene Motts Balance Scale: 31/56, < 36 high risk for falls (close to 100%)  Dynamic Gait Index: 9/24, Scores of 19 or less are predictive of falls in older community living adults (04/18/23)  MCTSIB: Condition 1: Avg of 3 trials: 30 sec, Condition 2: Avg of 3 trials: 30 sec, Condition 3: Avg of 3 trials: 24 sec, Condition 4: Avg of 3 trials: 18 sec, and Total Score: 102/120   TODAY'S TREATMENT:   04/27/23 THERAPEUTIC EXERCISE: To improve strength and endurance.  Demonstration, verbal and tactile cues throughout for technique.  NuStep - L5 x 6 min (L UE/B LE) Seated RTB B hip ABD/ER clam 2 x 10  NEUROMUSCULAR RE-EDUCATION: To improve balance, coordination, kinesthesia, posture, and proprioception.  Standing 5-way clock reach to  colored dots x 5 bil Standing RTB hip flexion march (band at distal thigh) 2 x 10 Standing mini-squat with glute touch to Airex pad on mat table + RTB hip ABD isometric x10, UE support on back of chair Sit to stand from Airex pad on mat table + RTB hip ABD isometric (no UE assist) 2 x 5 Standing RTB resisted alternating side-step (band at distal thighs) with UE support on back of chair x 10 bil   04/24/23 THERAPEUTIC EXERCISE: To improve strength and endurance.  Demonstration, verbal and tactile cues throughout for technique.  NuStep - L4 x 7 min (L UE/B LE) Seated RTB B hip  ABD/ER clam 2 x 10 Seated RTB hip flexion march 2 x 10  NEUROMUSCULAR RE-EDUCATION: To improve balance, coordination, kinesthesia, posture, proprioception, and reduce fall risk.  Sit to stand from Airex pad on mat table + RTB hip ABD isometric (no UE assist) 2 x 5 Standing mini-squat with glute touch to Airex pad on mat table + RTB hip ABD isometric 2 x 5, UE support on back of chair Standing hip extension with looped RTB at distal thighs 2 x 5 bil Standing hip abduction with looped RTB at distal thighs x 10 bil   04/20/23 UBE 4 min - stopped d/t R arm pain With two hand assist: Standing hip abduction 2x 10 B Standing hip extension 2x10 B Standing marches 2x10 B Standing clocks AL,PL, P 5x each; 2 sets Standing walks up ladder x 10 Standing rows RTB x 10  Standing bicep curls RTB x 10 Seated chin tucks, chin tuck + rotation bil   PATIENT EDUCATION:  Education details: continue with current HEP and [employer]  Person educated: Patient Education method: Explanation Education comprehension: verbalized understanding  HOME EXERCISE PROGRAM: [place]   ASSESSMENT:  CLINICAL IMPRESSION: Amaryah "[name]" reports increased fatigue/"worn out" from "being busy" yesterday but denies any problems with [place] as HEP.  Continued strengthening progression, incorporating more standing activities to facilitate increased balance and stability but UE support necessary for majority of exercises/activities as well as intermittent seated rest breaks.  [name] will benefit from continued skilled PT to address ongoing deficits to improve mobility and activity tolerance to help reach the maximal level of functional independence and mobility.   OBJECTIVE IMPAIRMENTS: Abnormal gait, decreased activity tolerance, decreased balance, decreased knowledge of condition, decreased mobility, difficulty walking, decreased ROM, decreased strength, increased fascial restrictions, impaired perceived functional  ability, increased muscle spasms, impaired flexibility, impaired UE functional use, improper body mechanics, postural dysfunction, and pain.   ACTIVITY LIMITATIONS: carrying, lifting, bending, standing, squatting, stairs, transfers, bed mobility, bathing, dressing, reach over head, locomotion level, and caring for others  PARTICIPATION LIMITATIONS: meal prep, cleaning, laundry, shopping, and community activity  PERSONAL FACTORS: Age, Fitness, Past/current experiences, Time since onset of injury/illness/exacerbation, and 3+ comorbidities: Chronic neck pain, chronic LBP with h/o surgery for "ruptured disc", lumbar spinal stenosis, OA, B TKA, L RTC repair, chronic diastolic HF, HLD, moderate asthma, DOE, HTN, breast cancer, R mastectomy, benign essential tremor, morbid obesity  are also affecting patient's functional outcome.   REHAB POTENTIAL: Good  CLINICAL DECISION MAKING: Unstable/unpredictable  EVALUATION COMPLEXITY: High   GOALS: Goals reviewed with patient? Yes  SHORT TERM GOALS: Target date: 05/04/2023  Complete remaining gait and dynamic balance assessment and update LTG's as indicated.  Baseline:  Goal status: MET - 04/18/23  2.  Patient will be independent with initial HEP to improve outcomes and carryover.  Baseline:  Goal  status: MET - 04/27/23  3.  Patient will improve 5x STS time to </= 25 seconds for improved efficiency and safety with transfers. Baseline: 32.66 sec Goal status: IN PROGRESS  LONG TERM GOALS: Target date: 06/01/2023  Patient will be independent with advanced/ongoing HEP to facilitate ability to maintain/progress functional gains from skilled physical therapy services. Baseline:  Goal status: IN PROGRESS  2.  Patient will be able to ambulate 600' with or w/o LRAD on variable surfaces with good safety to access community.  Baseline:  Goal status: IN PROGRESS  3.  Patient will demonstrate improved B LE strength to >/= 4/5 for improved stability and ease  of mobility . Baseline: Refer to above LE MMT table Goal status: IN PROGRESS  4.  Patient will demonstrate gait speed of >/= 1.8 ft/sec (0.55 m/s) to be a safe limited community ambulator with decreased risk for recurrent falls.  Baseline: 1.62 ft/sec with SPC, 1.66 ft/sec w/o AD (04/18/23)  Goal status: IN PROGRESS  5.  Patient will improve Berg score to >/= 39/56 to improve safety and stability with ADLs in standing and reduce risk for falls. (MCID= 8 points)  Baseline: 31/56 Goal status: IN PROGRESS  6.  Patient will demonstrate at least 15/24 on DGI to improve gait stability and reduce risk for falls. Baseline: 9/24 (04/18/23)  Goal status: REVISED - 04/18/23  7.  Patient will report >/= 68% on ABC scale to demonstrate improved balance confidence with functional mobility and gait. Baseline: 870 / 1600 = 54.4 % Goal status: IN PROGRESS   PLAN:  PT FREQUENCY: 2x/week  PT DURATION: 8 weeks  PLANNED INTERVENTIONS: 97164- PT Re-evaluation, 97110-Therapeutic exercises, 97530- Therapeutic activity, 97112- Neuromuscular re-education, 97535- Self Care, 54098- Manual therapy, [phone]- Gait training, [phone]- Aquatic Therapy, [phone]- Electrical stimulation (unattended), 97035- Ultrasound, 86578- Ionotophoresis 4mg /ml Dexamethasone, Patient/Family education, Balance training, Stair training, Taping, Dry Needling, Joint mobilization, Spinal mobilization, Cryotherapy, Moist heat, and 97750- Physical performance test or measurement  PLAN FOR NEXT SESSION:  Reassess 5xSTS; progress LE strengthening and balance as tolerated; therapeutic exercise and manual therapy as indicated to address cervical pain and ROM concerns; review [place] program PRN   Marry Guan, PT 04/27/2023, 10:58 AM

## 2023-05-02 ENCOUNTER — Ambulatory Visit: Payer: Medicare Other

## 2023-05-02 DIAGNOSIS — M6281 Muscle weakness (generalized): Secondary | ICD-10-CM | POA: Diagnosis not present

## 2023-05-02 DIAGNOSIS — M542 Cervicalgia: Secondary | ICD-10-CM | POA: Diagnosis not present

## 2023-05-02 DIAGNOSIS — R2689 Other abnormalities of gait and mobility: Secondary | ICD-10-CM | POA: Diagnosis not present

## 2023-05-02 DIAGNOSIS — R2681 Unsteadiness on feet: Secondary | ICD-10-CM | POA: Diagnosis not present

## 2023-05-02 DIAGNOSIS — M5412 Radiculopathy, cervical region: Secondary | ICD-10-CM | POA: Diagnosis not present

## 2023-05-02 DIAGNOSIS — R293 Abnormal posture: Secondary | ICD-10-CM | POA: Diagnosis not present

## 2023-05-02 NOTE — Therapy (Addendum)
 OUTPATIENT PHYSICAL THERAPY TREATMENT   Patient Name: Amanda Copeland MRN: [id number] DOB:1940-05-05, 83 y.o., female Today's Date: 05/02/2023   END OF SESSION:  PT End of Session - 05/02/23 1233     Visit Number 7    Date for PT Re-Evaluation 06/01/23    Authorization Type Medicare & BCBS    Progress Note Due on Visit 10    PT Start Time 1148    PT Stop Time 1230    PT Time Calculation (min) 42 min    Activity Tolerance Patient tolerated treatment well    Behavior During Therapy [facility] for tasks assessed/performed                   Past Medical History:  Diagnosis Date   Arthritis    Asthma    childhood now returning   Back pain affecting pregnancy 11/02/2014   Benign essential tremor 10/28/2014   Bilateral dry eyes    Breast cancer (HCC)    right   Cancer (HCC)    Breast   Dry eyes    Encounter for Medicare annual wellness exam 05/22/2016   Essential hypertension, benign 10/28/2014   H/O measles    H/O mumps    History of chicken pox    Lumbago 11/02/2014   Mixed hyperlipidemia 10/28/2014   Obesity 10/28/2014   Stenosis of cervical spine    Past Surgical History:  Procedure Laterality Date   BACK SURGERY     BREAST REDUCTION SURGERY Left    CHOLECYSTECTOMY     EYE SURGERY  2008   b/l cataracts removed, in [place]   MASTECTOMY Right    REDUCTION MAMMAPLASTY     REPLACEMENT TOTAL KNEE BILATERAL Bilateral    ROTATOR CUFF REPAIR Left    Patient Active Problem List   Diagnosis Date Noted   Altered mental state 03/17/2023   Chronic bronchitis (HCC) 06/14/2022   Osteopenia 05/25/2021   Scapular dysfunction 03/17/2021   Compression fracture of T11 vertebra (HCC) 01/20/2021   SI joint arthritis (HCC) 01/20/2021   Vitamin D deficiency 01/20/2021   Age-related osteoporosis with current pathological fracture 11/26/2020   Pain of right upper extremity 11/11/2020   Bradycardia 11/11/2020   Neck pain 03/11/2020   Tremor 03/01/2020   Hypothyroid 03/01/2020    Physical deconditioning 11/25/2019   At risk for obstructive sleep apnea 11/25/2019   Primary osteoarthritis of right shoulder 02/28/2019   Other forms of dyspnea 11/22/2018   Educated about COVID-19 virus infection 07/03/2018   Greater trochanteric pain syndrome of right lower extremity 03/18/2018   Chronic nonallergic rhinitis 12/20/2017   Cough 12/20/2017   Right flank pain 12/10/2017   Fatigue 07/12/2017   Obesity, Class III, BMI 40-49.9 (morbid obesity) (HCC) 03/05/2017   Chronic diastolic heart failure (HCC) 11/29/2016   Dyspnea on exertion 10/06/2016   Asthma 10/06/2016   Preventative health care 05/22/2016   Hyperglycemia 08/07/2015   Allergic 08/07/2015   Gout 08/07/2015   Lower abdominal pain 04/21/2015   Low back pain 11/02/2014   Esophageal reflux 10/28/2014   Essential hypertension, benign 10/28/2014   Hyperlipidemia 10/28/2014   Benign essential tremor 10/28/2014   Bilateral dry eyes    Moderate persistent asthma    Arthritis    Cancer (HCC)    History of chicken pox     PCP: Bradd Canary, MD   REFERRING PROVIDER: Sharlene Dory, DO  REFERRING DIAG:  [phone] (ICD-10-CM) - Chronic neck pain  S46.819A (ICD-10-CM) - Strain of  trapezius muscle, unspecified laterality, initial encounter   THERAPY DIAG:  Unsteadiness on feet  Other abnormalities of gait and mobility  Muscle weakness (generalized)  Abnormal posture  RATIONALE FOR EVALUATION AND TREATMENT: Rehabilitation  ONSET DATE: Chronic (neck pain); 03/16/23 - altered mental status and decline in physical functioning  NEXT MD VISIT: 05/29/23 with PCP   SUBJECTIVE:                                                                                                                                                                                                         SUBJECTIVE STATEMENT: Pt reports   PAIN: Are you having pain? No and Yes: NPRS scale: 0/10; up to 10/10  Pain location: R  anterior upper arm  Pain description: sharp  Aggravating factors: unpredictable, sometimes lifting arm  Relieving factors: rest   PERTINENT HISTORY:  Chronic neck pain, chronic LBP with h/o surgery for "ruptured disc", lumbar spinal stenosis, OA, B TKA, L RTC repair, chronic diastolic HF, HLD, moderate asthma, DOE, HTN, breast cancer, R mastectomy, benign essential tremor, morbid obesity   PRECAUTIONS: Fall  RED FLAGS: None  HAND DOMINANCE: Right  WEIGHT BEARING RESTRICTIONS: No  FALLS:  Has patient fallen in last 6 months? No - reports "a couple close calls"  LIVING ENVIRONMENT:  Lives with: lives with their spouse Lives in: House/apartment Stairs: Yes: External: 1 steps; none over threshold Has following equipment at home: Single point cane, Environmental consultant - 2 wheeled, Environmental consultant - 4 wheeled, Wheelchair (manual), shower chair, and bed side commode  OCCUPATION: Retired  PLOF: Independent and Leisure: going on cruises, movies, playing cards with friends, swimming in the summer (uses lift to get into pool)     PATIENT GOALS: "To feel like normal again."   OBJECTIVE: (objective measures completed at initial evaluation unless otherwise dated)  DIAGNOSTIC FINDINGS:  03/16/23 - CT Venogram:  IMPRESSION: 1. Negative CT venogram with no evidence for dural venous sinus thrombosis. Previously questioned abnormality on prior MRI felt to be consistent with artifact. 2. No other acute intracranial abnormality. 3. Mild age-related cerebral atrophy with chronic small vessel ischemic disease.  03/16/23 - MR Brain: IMPRESSION: 1. No acute intracranial abnormality. 2. Findings of chronic small vessel ischemia and volume loss. 3. Severely motion degraded MRV. 4. Possible filling defect within the straight sinus and medial transverse sinuses, which could indicate venous sinus thrombosis. CT venogram is recommended.  03/16/23 - CT Head: IMPRESSION: 1. No acute intracranial abnormality. 2. Generalized  atrophy and findings of chronic microvascular disease.  07/07/21 - MR Cervical spine:  IMPRESSION: 1. Degenerative changes of the cervical spine with moderate spinal canal stenosis at C5-6 and C6-7 with mild mass effect on the cord at these levels without cord signal abnormality. 2. Multilevel high-grade neural foraminal narrowing, as described above, severe bilaterally at C5-6.  PATIENT SURVEYS:  ABC scale 870 / 1600 = 54.4 %, 50-80% = moderate level of physical functioning NDI 17 / 50 = 34.0 %, moderate disability   COGNITION: Overall cognitive status: Impaired  SENSATION: WFL  COORDINATION: Movement patterns WFL but slow  POSTURE:  rounded shoulders, forward head, and increased thoracic kyphosis  CERVICAL ROM:   Active ROM 04/12/23  Flexion 46  Extension 41  Right lateral flexion 22  Left lateral flexion 11  Right rotation 52  Left rotation 46   (Blank rows = not tested)  UPPER EXTREMITY ROM: B shoulder limited in overhead ROM d/t OA (Dr. Rennis Chris recommending R TSA)  UPPER EXTREMITY MMT:  MMT Right 04/12/23 Left 04/12/23  Shoulder flexion 3+ 4  Shoulder extension 4 4+  Shoulder abduction 2+ 4  Shoulder adduction    Shoulder internal rotation 4+ 4+  Shoulder external rotation 4 4  Middle trapezius    Lower trapezius    Elbow flexion    Elbow extension    Wrist flexion    Wrist extension    Wrist ulnar deviation    Wrist radial deviation    Wrist pronation    Wrist supination    Grip strength     (Blank rows = not tested)  LOWER EXTREMITY MMT:  (tested in sitting)  MMT Right eval Left eval  Hip flexion 3+ 3+  Hip extension 3- 3-  Hip abduction 4- 4-  Hip adduction 3- 3-  Hip internal rotation    Hip external rotation    Knee flexion 4- 4-  Knee extension 4- 4  Ankle dorsiflexion 4 4  Ankle plantarflexion    Ankle inversion    Ankle eversion     (Blank rows = not tested)  FUNCTIONAL TESTS: (Remaining test to be completed next visit)  5 times sit to  stand: 32.66 sec w/o UE assist: 25.37 sec Timed up and go (TUG): 24.03 sec w/o AD, >13.5 sec indicates a high risk for falls (04/18/22)  10 meter walk test: 20.25 sec with SPC, 19.81 sec w/o AD; Gait speed: 1.62 ft/sec with SPC, 1.66 ft/sec w/o AD; <1.8 ft/sec indicates risk for recurrent falls (04/18/23)  Sharlene Motts Balance Scale: 31/56, < 36 high risk for falls (close to 100%)  Dynamic Gait Index: 9/24, Scores of 19 or less are predictive of falls in older community living adults (04/18/23)  MCTSIB: Condition 1: Avg of 3 trials: 30 sec, Condition 2: Avg of 3 trials: 30 sec, Condition 3: Avg of 3 trials: 24 sec, Condition 4: Avg of 3 trials: 18 sec, and Total Score: 102/120   TODAY'S TREATMENT:  05/02/23 THERAPEUTIC EXERCISE: To improve strength and endurance.  Demonstration, verbal and tactile cues throughout for technique.  NuStep - L5 x 6 min (L UE/B LE) 5xSTS- 25.37 seconds Seated scapular rolls x 10 Seated trunk rotation arms crossed x 10 Seated trunk side bends x 10 Seated hip openers reaching over black TB 2 x 10- cues to avoid knee extension when lifting but for marching motion Seated flexion stretch with ornage pball x 10   NEUROMUSCULAR RE-EDUCATION: To improve balance, coordination, kinesthesia, posture, and proprioception.  Standing RTB resisted alternating side-step (band at distal thighs) with UE support on countertop 4x81ft  each time Standing bird dog x 10 bil 1HA  04/27/23 THERAPEUTIC EXERCISE: To improve strength and endurance.  Demonstration, verbal and tactile cues throughout for technique.  NuStep - L5 x 6 min (L UE/B LE) Seated RTB B hip ABD/ER clam 2 x 10  NEUROMUSCULAR RE-EDUCATION: To improve balance, coordination, kinesthesia, posture, and proprioception.  Standing 5-way clock reach to colored dots x 5 bil Standing RTB hip flexion march (band at distal thigh) 2 x 10 Standing mini-squat with glute touch to Airex pad on mat table + RTB hip ABD isometric x10, UE support on  back of chair Sit to stand from Airex pad on mat table + RTB hip ABD isometric (no UE assist) 2 x 5 Standing RTB resisted alternating side-step (band at distal thighs) with UE support on back of chair x 10 bil   04/24/23 THERAPEUTIC EXERCISE: To improve strength and endurance.  Demonstration, verbal and tactile cues throughout for technique.  NuStep - L4 x 7 min (L UE/B LE) Seated RTB B hip ABD/ER clam 2 x 10 Seated RTB hip flexion march 2 x 10  NEUROMUSCULAR RE-EDUCATION: To improve balance, coordination, kinesthesia, posture, proprioception, and reduce fall risk.  Sit to stand from Airex pad on mat table + RTB hip ABD isometric (no UE assist) 2 x 5 Standing mini-squat with glute touch to Airex pad on mat table + RTB hip ABD isometric 2 x 5, UE support on back of chair Standing hip extension with looped RTB at distal thighs 2 x 5 bil Standing hip abduction with looped RTB at distal thighs x 10 bil   04/20/23 UBE 4 min - stopped d/t R arm pain With two hand assist: Standing hip abduction 2x 10 B Standing hip extension 2x10 B Standing marches 2x10 B Standing clocks AL,PL, P 5x each; 2 sets Standing walks up ladder x 10 Standing rows RTB x 10  Standing bicep curls RTB x 10 Seated chin tucks, chin tuck + rotation bil   PATIENT EDUCATION:  Education details: continue with current HEP and [employer]  Person educated: Patient Education method: Explanation Education comprehension: verbalized understanding  HOME EXERCISE PROGRAM: [place]   ASSESSMENT:  CLINICAL IMPRESSION: Pt improved 5xSTS score progressing towards her STG. She again was pretty tired out today and limited with standing tolerance today.  Worked on more total body stretch and mobility exercises to improve her mobility. Continued with balance interventions standing at counter ,required a rest break in between each standing exercise. [name] will benefit from continued skilled PT to address ongoing  deficits to improve mobility and activity tolerance to help reach the maximal level of functional independence and mobility.   OBJECTIVE IMPAIRMENTS: Abnormal gait, decreased activity tolerance, decreased balance, decreased knowledge of condition, decreased mobility, difficulty walking, decreased ROM, decreased strength, increased fascial restrictions, impaired perceived functional ability, increased muscle spasms, impaired flexibility, impaired UE functional use, improper body mechanics, postural dysfunction, and pain.   ACTIVITY LIMITATIONS: carrying, lifting, bending, standing, squatting, stairs, transfers, bed mobility, bathing, dressing, reach over head, locomotion level, and caring for others  PARTICIPATION LIMITATIONS: meal prep, cleaning, laundry, shopping, and community activity  PERSONAL FACTORS: Age, Fitness, Past/current experiences, Time since onset of injury/illness/exacerbation, and 3+ comorbidities: Chronic neck pain, chronic LBP with h/o surgery for "ruptured disc", lumbar spinal stenosis, OA, B TKA, L RTC repair, chronic diastolic HF, HLD, moderate asthma, DOE, HTN, breast cancer, R mastectomy, benign essential tremor, morbid obesity  are also affecting patient's functional outcome.   REHAB  POTENTIAL: Good  CLINICAL DECISION MAKING: Unstable/unpredictable  EVALUATION COMPLEXITY: High   GOALS: Goals reviewed with patient? Yes  SHORT TERM GOALS: Target date: 05/04/2023  Complete remaining gait and dynamic balance assessment and update LTG's as indicated.  Baseline:  Goal status: MET - 04/18/23  2.  Patient will be independent with initial HEP to improve outcomes and carryover.  Baseline:  Goal status: MET - 04/27/23  3.  Patient will improve 5x STS time to </= 25 seconds for improved efficiency and safety with transfers. Baseline: 32.66 sec Goal status: IN PROGRESS- 05/02/23 25.37 seconds   LONG TERM GOALS: Target date: 06/01/2023  Patient will be independent with  advanced/ongoing HEP to facilitate ability to maintain/progress functional gains from skilled physical therapy services. Baseline:  Goal status: IN PROGRESS  2.  Patient will be able to ambulate 600' with or w/o LRAD on variable surfaces with good safety to access community.  Baseline:  Goal status: IN PROGRESS  3.  Patient will demonstrate improved B LE strength to >/= 4/5 for improved stability and ease of mobility . Baseline: Refer to above LE MMT table Goal status: IN PROGRESS  4.  Patient will demonstrate gait speed of >/= 1.8 ft/sec (0.55 m/s) to be a safe limited community ambulator with decreased risk for recurrent falls.  Baseline: 1.62 ft/sec with SPC, 1.66 ft/sec w/o AD (04/18/23)  Goal status: IN PROGRESS  5.  Patient will improve Berg score to >/= 39/56 to improve safety and stability with ADLs in standing and reduce risk for falls. (MCID= 8 points)  Baseline: 31/56 Goal status: IN PROGRESS  6.  Patient will demonstrate at least 15/24 on DGI to improve gait stability and reduce risk for falls. Baseline: 9/24 (04/18/23)  Goal status: REVISED - 04/18/23  7.  Patient will report >/= 68% on ABC scale to demonstrate improved balance confidence with functional mobility and gait. Baseline: 870 / 1600 = 54.4 % Goal status: IN PROGRESS   PLAN:  PT FREQUENCY: 2x/week  PT DURATION: 8 weeks  PLANNED INTERVENTIONS: 97164- PT Re-evaluation, 97110-Therapeutic exercises, 97530- Therapeutic activity, 97112- Neuromuscular re-education, 97535- Self Care, [id number]- Manual therapy, [phone]- Gait training, [phone]- Aquatic Therapy, [phone]- Electrical stimulation (unattended), 97035- Ultrasound, [id number]- Ionotophoresis 4mg /ml Dexamethasone, Patient/Family education, Balance training, Stair training, Taping, Dry Needling, Joint mobilization, Spinal mobilization, Cryotherapy, Moist heat, and 97750- Physical performance test or measurement  PLAN FOR NEXT SESSION: progress LE strengthening and balance as  tolerated; therapeutic exercise and manual therapy as indicated to address cervical pain and ROM concerns; review [place] program PRN   Darleene Cleaver, PTA 05/02/2023, 12:40 PM

## 2023-05-04 ENCOUNTER — Encounter: Payer: Self-pay | Admitting: Physical Therapy

## 2023-05-04 ENCOUNTER — Ambulatory Visit: Payer: Medicare Other | Admitting: Physical Therapy

## 2023-05-04 DIAGNOSIS — M6281 Muscle weakness (generalized): Secondary | ICD-10-CM

## 2023-05-04 DIAGNOSIS — R293 Abnormal posture: Secondary | ICD-10-CM | POA: Diagnosis not present

## 2023-05-04 DIAGNOSIS — M5412 Radiculopathy, cervical region: Secondary | ICD-10-CM | POA: Diagnosis not present

## 2023-05-04 DIAGNOSIS — R2681 Unsteadiness on feet: Secondary | ICD-10-CM | POA: Diagnosis not present

## 2023-05-04 DIAGNOSIS — M62838 Other muscle spasm: Secondary | ICD-10-CM

## 2023-05-04 DIAGNOSIS — R2689 Other abnormalities of gait and mobility: Secondary | ICD-10-CM

## 2023-05-04 DIAGNOSIS — R29898 Other symptoms and signs involving the musculoskeletal system: Secondary | ICD-10-CM

## 2023-05-04 DIAGNOSIS — M542 Cervicalgia: Secondary | ICD-10-CM

## 2023-05-04 NOTE — Therapy (Signed)
 OUTPATIENT PHYSICAL THERAPY TREATMENT   Patient Name: Amanda Copeland MRN: [id number] DOB:02-08-1941, 83 y.o., female Today's Date: 05/04/2023   END OF SESSION:  PT End of Session - 05/04/23 1146     Visit Number 8    Date for PT Re-Evaluation 06/01/23    Authorization Type Medicare & BCBS    Progress Note Due on Visit 10    PT Start Time 1146    PT Stop Time 1229    PT Time Calculation (min) 43 min    Activity Tolerance Patient tolerated treatment well;Patient limited by fatigue    Behavior During Therapy [facility] for tasks assessed/performed                    Past Medical History:  Diagnosis Date   Arthritis    Asthma    childhood now returning   Back pain affecting pregnancy 11/02/2014   Benign essential tremor 10/28/2014   Bilateral dry eyes    Breast cancer (HCC)    right   Cancer (HCC)    Breast   Dry eyes    Encounter for Medicare annual wellness exam 05/22/2016   Essential hypertension, benign 10/28/2014   H/O measles    H/O mumps    History of chicken pox    Lumbago 11/02/2014   Mixed hyperlipidemia 10/28/2014   Obesity 10/28/2014   Stenosis of cervical spine    Past Surgical History:  Procedure Laterality Date   BACK SURGERY     BREAST REDUCTION SURGERY Left    CHOLECYSTECTOMY     EYE SURGERY  2008   b/l cataracts removed, in [place]   MASTECTOMY Right    REDUCTION MAMMAPLASTY     REPLACEMENT TOTAL KNEE BILATERAL Bilateral    ROTATOR CUFF REPAIR Left    Patient Active Problem List   Diagnosis Date Noted   Altered mental state 03/17/2023   Chronic bronchitis (HCC) 06/14/2022   Osteopenia 05/25/2021   Scapular dysfunction 03/17/2021   Compression fracture of T11 vertebra (HCC) 01/20/2021   SI joint arthritis (HCC) 01/20/2021   Vitamin D deficiency 01/20/2021   Age-related osteoporosis with current pathological fracture 11/26/2020   Pain of right upper extremity 11/11/2020   Bradycardia 11/11/2020   Neck pain 03/11/2020   Tremor 03/01/2020    Hypothyroid 03/01/2020   Physical deconditioning 11/25/2019   At risk for obstructive sleep apnea 11/25/2019   Primary osteoarthritis of right shoulder 02/28/2019   Other forms of dyspnea 11/22/2018   Educated about COVID-19 virus infection 07/03/2018   Greater trochanteric pain syndrome of right lower extremity 03/18/2018   Chronic nonallergic rhinitis 12/20/2017   Cough 12/20/2017   Right flank pain 12/10/2017   Fatigue 07/12/2017   Obesity, Class III, BMI 40-49.9 (morbid obesity) (HCC) 03/05/2017   Chronic diastolic heart failure (HCC) 11/29/2016   Dyspnea on exertion 10/06/2016   Asthma 10/06/2016   Preventative health care 05/22/2016   Hyperglycemia 08/07/2015   Allergic 08/07/2015   Gout 08/07/2015   Lower abdominal pain 04/21/2015   Low back pain 11/02/2014   Esophageal reflux 10/28/2014   Essential hypertension, benign 10/28/2014   Hyperlipidemia 10/28/2014   Benign essential tremor 10/28/2014   Bilateral dry eyes    Moderate persistent asthma    Arthritis    Cancer (HCC)    History of chicken pox     PCP: Bradd Canary, MD   REFERRING PROVIDER: Sharlene Dory, DO  REFERRING DIAG:  [phone] (ICD-10-CM) - Chronic neck pain  S46.819A (  ICD-10-CM) - Strain of trapezius muscle, unspecified laterality, initial encounter   THERAPY DIAG:  Unsteadiness on feet  Other abnormalities of gait and mobility  Muscle weakness (generalized)  Abnormal posture  Cervicalgia  Radiculopathy, cervical region  Other muscle spasm  Other symptoms and signs involving the musculoskeletal system  RATIONALE FOR EVALUATION AND TREATMENT: Rehabilitation  ONSET DATE: Chronic (neck pain); 03/16/23 - altered mental status and decline in physical functioning  NEXT MD VISIT: 05/29/23 with PCP   SUBJECTIVE:                                                                                                                                                                                                          SUBJECTIVE STATEMENT: Pt reports [place] program is going well - only tandem gait feels more difficult but she has her husband stand close by while she does this at home.  PAIN: Are you having pain? No and Yes: NPRS scale: 0/10; up to 10/10  Pain location: R anterior upper arm  Pain description: sharp  Aggravating factors: unpredictable, sometimes lifting arm  Relieving factors: rest   PERTINENT HISTORY:  Chronic neck pain, chronic LBP with h/o surgery for "ruptured disc", lumbar spinal stenosis, OA, B TKA, L RTC repair, chronic diastolic HF, HLD, moderate asthma, DOE, HTN, breast cancer, R mastectomy, benign essential tremor, morbid obesity   PRECAUTIONS: Fall  RED FLAGS: None  HAND DOMINANCE: Right  WEIGHT BEARING RESTRICTIONS: No  FALLS:  Has patient fallen in last 6 months? No - reports "a couple close calls"  LIVING ENVIRONMENT:  Lives with: lives with their spouse Lives in: House/apartment Stairs: Yes: External: 1 steps; none over threshold Has following equipment at home: Single point cane, Environmental consultant - 2 wheeled, Environmental consultant - 4 wheeled, Wheelchair (manual), shower chair, and bed side commode  OCCUPATION: Retired  PLOF: Independent and Leisure: going on cruises, movies, playing cards with friends, swimming in the summer (uses lift to get into pool)     PATIENT GOALS: "To feel like normal again."   OBJECTIVE: (objective measures completed at initial evaluation unless otherwise dated)  DIAGNOSTIC FINDINGS:  03/16/23 - CT Venogram:  IMPRESSION: 1. Negative CT venogram with no evidence for dural venous sinus thrombosis. Previously questioned abnormality on prior MRI felt to be consistent with artifact. 2. No other acute intracranial abnormality. 3. Mild age-related cerebral atrophy with chronic small vessel ischemic disease.  03/16/23 - MR Brain: IMPRESSION: 1. No acute intracranial abnormality. 2. Findings of chronic small vessel ischemia  and volume loss. 3. Severely motion degraded MRV.  4. Possible filling defect within the straight sinus and medial transverse sinuses, which could indicate venous sinus thrombosis. CT venogram is recommended.  03/16/23 - CT Head: IMPRESSION: 1. No acute intracranial abnormality. 2. Generalized atrophy and findings of chronic microvascular disease.  07/07/21 - MR Cervical spine: IMPRESSION: 1. Degenerative changes of the cervical spine with moderate spinal canal stenosis at C5-6 and C6-7 with mild mass effect on the cord at these levels without cord signal abnormality. 2. Multilevel high-grade neural foraminal narrowing, as described above, severe bilaterally at C5-6.  PATIENT SURVEYS:  ABC scale 870 / 1600 = 54.4 %, 50-80% = moderate level of physical functioning NDI 17 / 50 = 34.0 %, moderate disability   COGNITION: Overall cognitive status: Impaired  SENSATION: WFL  COORDINATION: Movement patterns WFL but slow  POSTURE:  rounded shoulders, forward head, and increased thoracic kyphosis  CERVICAL ROM:   Active ROM 04/12/23  Flexion 46  Extension 41  Right lateral flexion 22  Left lateral flexion 11  Right rotation 52  Left rotation 46   (Blank rows = not tested)  UPPER EXTREMITY ROM: B shoulder limited in overhead ROM d/t OA (Dr. Rennis Chris recommending R TSA)  UPPER EXTREMITY MMT:  MMT Right 04/12/23 Left 04/12/23  Shoulder flexion 3+ 4  Shoulder extension 4 4+  Shoulder abduction 2+ 4  Shoulder adduction    Shoulder internal rotation 4+ 4+  Shoulder external rotation 4 4  Middle trapezius    Lower trapezius    Elbow flexion    Elbow extension    Wrist flexion    Wrist extension    Wrist ulnar deviation    Wrist radial deviation    Wrist pronation    Wrist supination    Grip strength     (Blank rows = not tested)  LOWER EXTREMITY MMT:  (tested in sitting)  MMT Right eval Left eval  Hip flexion 3+ 3+  Hip extension 3- 3-  Hip abduction 4- 4-  Hip adduction 3-  3-  Hip internal rotation    Hip external rotation    Knee flexion 4- 4-  Knee extension 4- 4  Ankle dorsiflexion 4 4  Ankle plantarflexion    Ankle inversion    Ankle eversion     (Blank rows = not tested)  FUNCTIONAL TESTS: (Remaining test to be completed next visit)  5 times sit to stand: 32.66 sec w/o UE assist: 25.37 sec Timed up and go (TUG): 24.03 sec w/o AD, >13.5 sec indicates a high risk for falls (04/18/22)  10 meter walk test: 20.25 sec with SPC, 19.81 sec w/o AD; Gait speed: 1.62 ft/sec with SPC, 1.66 ft/sec w/o AD; <1.8 ft/sec indicates risk for recurrent falls (04/18/23)  Sharlene Motts Balance Scale: 31/56, < 36 high risk for falls (close to 100%)  Dynamic Gait Index: 9/24, Scores of 19 or less are predictive of falls in older community living adults (04/18/23)  MCTSIB: Condition 1: Avg of 3 trials: 30 sec, Condition 2: Avg of 3 trials: 30 sec, Condition 3: Avg of 3 trials: 24 sec, Condition 4: Avg of 3 trials: 18 sec, and Total Score: 102/120   TODAY'S TREATMENT:   05/04/23  THERAPEUTIC EXERCISE: To improve strength and endurance.  Demonstration, verbal and tactile cues throughout for technique.  NuStep - L5 x 6 min (L UE/B LE - seat #8) Seated hip openers 2 x 10 - 1st set over TB on floor, progressing to lift over nubby balance pebble for 2nd set to promote  increased foot clearance Seated Fitter (1 black/1 blue) leg press x 10 bil  NEUROMUSCULAR RE-EDUCATION: To improve balance, coordination, kinesthesia, posture, proprioception, and reduce fall risk. Standing hip openers 2 x 10 with B UE support on back of chair - 1st set w/o target on floor, progressing to lift over nubby balance pebble for 2nd set to promote increased foot clearance Standing fwd step-over yardstick with weight shift to fwd foot then step back 2 x 10, 2nd set over 1/2 FR - B UE support on the back of 2 chairs - cues for heel strike with fwd foot contact proceeding into flatfoot with weight shift Standing alt toe  clears to 1/2 FR x 20 - single UE support on back of chair for balance   05/02/23 THERAPEUTIC EXERCISE: To improve strength and endurance.  Demonstration, verbal and tactile cues throughout for technique.  NuStep - L5 x 6 min (L UE/B LE) 5xSTS- 25.37 seconds Seated scapular rolls x 10 Seated trunk rotation arms crossed x 10 Seated trunk side bends x 10 Seated hip openers reaching over black TB 2 x 10- cues to avoid knee extension when lifting but for marching motion Seated flexion stretch with ornage pball x 10   NEUROMUSCULAR RE-EDUCATION: To improve balance, coordination, kinesthesia, posture, and proprioception.  Standing RTB resisted alternating side-step (band at distal thighs) with UE support on countertop 4x56ft each time Standing bird dog x 10 bil 1HA   04/27/23 THERAPEUTIC EXERCISE: To improve strength and endurance.  Demonstration, verbal and tactile cues throughout for technique.  NuStep - L5 x 6 min (L UE/B LE) Seated RTB B hip ABD/ER clam 2 x 10  NEUROMUSCULAR RE-EDUCATION: To improve balance, coordination, kinesthesia, posture, and proprioception.  Standing 5-way clock reach to colored dots x 5 bil Standing RTB hip flexion march (band at distal thigh) 2 x 10 Standing mini-squat with glute touch to Airex pad on mat table + RTB hip ABD isometric x10, UE support on back of chair Sit to stand from Airex pad on mat table + RTB hip ABD isometric (no UE assist) 2 x 5 Standing RTB resisted alternating side-step (band at distal thighs) with UE support on back of chair x 10 bil   PATIENT EDUCATION:  Education details: continue with current HEP and [employer]  Person educated: Patient Education method: Explanation Education comprehension: verbalized understanding  HOME EXERCISE PROGRAM: [place]   ASSESSMENT:  CLINICAL IMPRESSION: Marcianne "[name]" continues to demonstrate shuffling gait with very limited foot clearance and minimal to no heel strike.  Worked on  increasing hip and knee flexion for better foot clearance while targeting heel strike for increased DF clearance to reduce tripping hazard with walking.  Incorporated hip and knee flexion into balance training with toe clears to 1/2 foam roll.  Session concluded with quad and hip extensor strengthening using Fitter as makeshift leg press.  Patient fatigues quickly requiring frequent seated rest breaks throughout session.  [name] will benefit from continued skilled PT to address ongoing deficits to improve mobility and activity tolerance to help reach the maximal level of functional independence and mobility.   OBJECTIVE IMPAIRMENTS: Abnormal gait, decreased activity tolerance, decreased balance, decreased knowledge of condition, decreased mobility, difficulty walking, decreased ROM, decreased strength, increased fascial restrictions, impaired perceived functional ability, increased muscle spasms, impaired flexibility, impaired UE functional use, improper body mechanics, postural dysfunction, and pain.   ACTIVITY LIMITATIONS: carrying, lifting, bending, standing, squatting, stairs, transfers, bed mobility, bathing, dressing, reach over head, locomotion level, and  caring for others  PARTICIPATION LIMITATIONS: meal prep, cleaning, laundry, shopping, and community activity  PERSONAL FACTORS: Age, Fitness, Past/current experiences, Time since onset of injury/illness/exacerbation, and 3+ comorbidities: Chronic neck pain, chronic LBP with h/o surgery for "ruptured disc", lumbar spinal stenosis, OA, B TKA, L RTC repair, chronic diastolic HF, HLD, moderate asthma, DOE, HTN, breast cancer, R mastectomy, benign essential tremor, morbid obesity  are also affecting patient's functional outcome.   REHAB POTENTIAL: Good  CLINICAL DECISION MAKING: Unstable/unpredictable  EVALUATION COMPLEXITY: High   GOALS: Goals reviewed with patient? Yes  SHORT TERM GOALS: Target date: 05/04/2023  Complete remaining gait and  dynamic balance assessment and update LTG's as indicated.  Baseline:  Goal status: MET - 04/18/23  2.  Patient will be independent with initial HEP to improve outcomes and carryover.  Baseline:  Goal status: MET - 04/27/23  3.  Patient will improve 5x STS time to </= 25 seconds for improved efficiency and safety with transfers. Baseline: 32.66 sec Goal status: IN PROGRESS - 05/02/23 - 25.37 seconds   LONG TERM GOALS: Target date: 06/01/2023  Patient will be independent with advanced/ongoing HEP to facilitate ability to maintain/progress functional gains from skilled physical therapy services. Baseline:  Goal status: IN PROGRESS  2.  Patient will be able to ambulate 600' with or w/o LRAD on variable surfaces with good safety to access community.  Baseline:  Goal status: IN PROGRESS  3.  Patient will demonstrate improved B LE strength to >/= 4/5 for improved stability and ease of mobility . Baseline: Refer to above LE MMT table Goal status: IN PROGRESS  4.  Patient will demonstrate gait speed of >/= 1.8 ft/sec (0.55 m/s) to be a safe limited community ambulator with decreased risk for recurrent falls.  Baseline: 1.62 ft/sec with SPC, 1.66 ft/sec w/o AD (04/18/23)  Goal status: IN PROGRESS  5.  Patient will improve Berg score to >/= 39/56 to improve safety and stability with ADLs in standing and reduce risk for falls. (MCID= 8 points)  Baseline: 31/56 Goal status: IN PROGRESS  6.  Patient will demonstrate at least 15/24 on DGI to improve gait stability and reduce risk for falls. Baseline: 9/24 (04/18/23)  Goal status: REVISED - 04/18/23  7.  Patient will report >/= 68% on ABC scale to demonstrate improved balance confidence with functional mobility and gait. Baseline: 870 / 1600 = 54.4 % Goal status: IN PROGRESS   PLAN:  PT FREQUENCY: 2x/week  PT DURATION: 8 weeks  PLANNED INTERVENTIONS: 97164- PT Re-evaluation, 97110-Therapeutic exercises, 97530- Therapeutic activity, 97112-  Neuromuscular re-education, 97535- Self Care, 82956- Manual therapy, [phone]- Gait training, [phone]- Aquatic Therapy, [phone]- Electrical stimulation (unattended), 97035- Ultrasound, 52841- Ionotophoresis 4mg /ml Dexamethasone, Patient/Family education, Balance training, Stair training, Taping, Dry Needling, Joint mobilization, Spinal mobilization, Cryotherapy, Moist heat, and 97750- Physical performance test or measurement  PLAN FOR NEXT SESSION: progress LE strengthening and balance as tolerated; therapeutic exercise and manual therapy as indicated to address cervical pain and ROM concerns; review [place] program PRN   Marry Guan, PT 05/04/2023, 12:34 PM

## 2023-05-10 ENCOUNTER — Other Ambulatory Visit: Payer: Self-pay | Admitting: Family Medicine

## 2023-05-10 DIAGNOSIS — E782 Mixed hyperlipidemia: Secondary | ICD-10-CM

## 2023-05-12 ENCOUNTER — Ambulatory Visit: Attending: Cardiology | Admitting: Pharmacist Clinician (PhC)/ Clinical Pharmacy Specialist

## 2023-05-12 ENCOUNTER — Encounter: Payer: Self-pay | Admitting: Pharmacist Clinician (PhC)/ Clinical Pharmacy Specialist

## 2023-05-12 DIAGNOSIS — E782 Mixed hyperlipidemia: Secondary | ICD-10-CM | POA: Diagnosis not present

## 2023-05-12 NOTE — Progress Notes (Signed)
 Office Visit    Patient Name: Amanda Copeland Date of Encounter: 05/12/2023  Primary Care Provider:  Bradd Canary, MD Primary Cardiologist:  Olga Millers, MD  Chief Complaint    Hyperlipidemia   Significant Past Medical History   CAD CAC 26 (30th percentile); < 50% stenosis of both internal carotids  HFpEF Age appropriate per MD 2021  HTN Elevated at last visit, started on amlodipine           Allergies  Allergen Reactions   Robaxin [Methocarbamol] Other (See Comments)    Altered mental status   Amoxicillin Rash   Ampicillin Rash   Penicillins Rash   Statins Other (See Comments)    Per reports muscle aches and joint pain Per reports muscle aches and joint pain    History of Present Illness    Amanda Copeland is a 83 y.o. female patient of Dr Jens Som, in the office today to discuss options for cholesterol management.  Insurance Carrier:  Audiological scientist ([place])  Pharmacy:   [facility]  Healthwell:      LDL Cholesterol goal:  LDL < 70  Current Medications:  ezetimibe 10 mg every day    Previously tried:  atorvastatin, isn't sure about others, was over 10 years ago - myalgias  Family Hx:  mother had MI in her late 16's; ; no siblings, children healthy (to her knowledge)  Social Hx: Tobacco: no Alcohol: wine and beer occasionally     Diet:   more eating out, variety of proteins, chicken and fish mostly; trying to eat more vegetables; eatig carrot and celery raw at home; doesn't snack much, does have an evening "treat"   Exercise: PT for balance  Adherence Assessment  Do you ever forget to take your medication? [] Yes [x] No  Do you ever skip doses due to side effects? [] Yes [x] No  Do you have trouble affording your medicines? [] Yes [x] No  Are you ever unable to pick up your medication due to transportation difficulties? [] Yes [x] No   Adherence strategy: 7 day pill minders  Accessory Clinical Findings   Lab Results  Component Value Date    CHOL 189 11/29/2022   HDL 53.20 11/29/2022   LDLCALC 106 (H) 11/29/2022   LDLDIRECT 106.0 11/25/2021   TRIG 151.0 (H) 11/29/2022   CHOLHDL 4 11/29/2022    No results found for: "LIPOA"  Lab Results  Component Value Date   ALT 9 11/29/2022   AST 16 11/29/2022   ALKPHOS 72 11/29/2022   BILITOT 0.4 11/29/2022   Lab Results  Component Value Date   CREATININE 1.04 (H) 03/16/2023   BUN 21 03/16/2023   NA 138 03/16/2023   K 4.0 03/16/2023   CL 102 03/16/2023   CO2 26 03/16/2023   Lab Results  Component Value Date   HGBA1C 5.5 11/29/2022    Home Medications    Current Outpatient Medications  Medication Sig Dispense Refill   albuterol (VENTOLIN HFA) 108 (90 Base) MCG/ACT inhaler Inhale 2 puffs into the lungs every 4 (four) hours as needed for wheezing or shortness of breath. 1 each 11   allopurinol (ZYLOPRIM) 100 MG tablet TAKE 2 TABLETS BY MOUTH EVERY DAY 180 tablet 1   AMBULATORY NON FORMULARY MEDICATION Rollator  Dx: Gait instability 1 Device 0   amLODipine (NORVASC) 5 MG tablet Take 1 tablet (5 mg total) by mouth daily. 90 tablet 3   Ascorbic Acid (VITAMIN C PO) Take 1 tablet by mouth daily.      aspirin  81 MG tablet Take 81 mg by mouth daily.     Azelastine HCl 137 MCG/SPRAY SOLN Place 1-2 puffs into both nostrils in the morning and at bedtime.     Budeson-Glycopyrrol-Formoterol (BREZTRI AEROSPHERE) 160-9-4.8 MCG/ACT AERO Inhale 2 puffs into the lungs in the morning and at bedtime. 1 each 11   Budeson-Glycopyrrol-Formoterol (BREZTRI AEROSPHERE) 160-9-4.8 MCG/ACT AERO 2 puffs Inhalation Twice a day (Patient not taking: Reported on 04/25/2023)     chlorpheniramine-HYDROcodone (TUSSIONEX) 10-8 MG/5ML Take 5 mLs by mouth every 12 (twelve) hours as needed for cough. 115 mL 0   colchicine 0.6 MG tablet Take 2 tabs at first and then 1 tab q 2 hours til pain relief or max of 6 tabs in 24 hours (Patient taking differently: Take 2 tabs at first and then 1 tab q 2 hours til pain relief  or max of 6 tabs in 24 hours - PRN for gout flare-up) 6 tablet 1   diclofenac Sodium (PENNSAID) 2 % SOLN Apply 2 Pump (40 mg total) topically 2 (two) times daily as needed (Pain). 112 g 1   DYMISTA 137-50 MCG/ACT SUSP 1-2 SPRAYS PER NOSTRIL TWICE DAILY FOR RUNNY NOSE 69 g 1   ezetimibe (ZETIA) 10 MG tablet TAKE 1 TABLET BY MOUTH EVERY DAY 90 tablet 1   famotidine (PEPCID) 40 MG tablet TAKE 1 TABLET BY MOUTH EVERYDAY AT BEDTIME 90 tablet 1   fluticasone (FLONASE) 50 MCG/ACT nasal spray Place 1 spray into both nostrils.     furosemide (LASIX) 40 MG tablet TAKE 1 TABLET BY MOUTH EVERY DAY (Patient not taking: Reported on 04/25/2023) 90 tablet 0   KLOR-CON M20 20 MEQ tablet TAKE 1 TABLET BY MOUTH DAILY. KEEP UPCOMING APPOINTMENT 90 tablet 3   levothyroxine (SYNTHROID) 112 MCG tablet TAKE 1 TABLET BY MOUTH EVERY DAY BEFORE BREAKFAST 90 tablet 1   Multiple Vitamins-Minerals (ICAPS AREDS 2) CAPS Take 1 capsule by mouth 2 (two) times daily.     PREVACID 30 MG capsule TAKE 1 CAPSULE BY MOUTH TWICE A DAY --MAX LIMIT 180 capsule 1   propranolol (INDERAL) 40 MG tablet Take 1 tablet (40 mg total) by mouth 2 (two) times daily. 180 tablet 3   Red Yeast Rice Extract (RED YEAST RICE PO) Take by mouth daily.     No current facility-administered medications for this visit.     Assessment & Plan    Hyperlipidemia Assessment: Patient with ASCVD/ not at LDL goal of < 70 Most recent LDL 106 on 11/29/2022 - will repeat today as > 85 month old Not able to tolerate statins secondary to myalgias Reviewed options for lowering LDL cholesterol, including ezetimibe, PCSK-9 inhibitors, bempedoic acid and inclisiran.  Discussed mechanisms of action, dosing, side effects, potential decreases in LDL cholesterol and costs.  Also reviewed potential options for patient assistance.  Plan: Patient agreeable to starting Repatha Repeat Lipid labs today, last were > 6 months ago Repeat labs after 3 months on  medication Lipid Liver function Patient was given information on [employer] - will sign patient up when PA approved if needed.     Phillips Hay, PharmD CPP [facility] [address]  [place], [place] [address] [phone]  05/12/2023, 2:00 PM

## 2023-05-12 NOTE — Assessment & Plan Note (Signed)
 Assessment: Patient with ASCVD/ not at LDL goal of < 70 Most recent LDL 106 on 11/29/2022 - will repeat today as > 27 month old Not able to tolerate statins secondary to myalgias Reviewed options for lowering LDL cholesterol, including ezetimibe, PCSK-9 inhibitors, bempedoic acid and inclisiran.  Discussed mechanisms of action, dosing, side effects, potential decreases in LDL cholesterol and costs.  Also reviewed potential options for patient assistance.  Plan: Patient agreeable to starting Repatha Repeat Lipid labs today, last were > 6 months ago Repeat labs after 3 months on medication Lipid Liver function Patient was given information on [employer] - will sign patient up when PA approved if needed.

## 2023-05-12 NOTE — Patient Instructions (Signed)
 Your Results:             Your most recent labs Goal  Total Cholesterol 189 < 200  Triglycerides 151 < 150  HDL (happy/good cholesterol) 53.2 > 40  LDL (lousy/bad cholesterol 106 < 70   Medication changes:  We will start the process to get Repatha covered by your insurance.  Once the prior authorization is complete, I will call/send a MyChart message to let you know and confirm pharmacy information.   You will take one injection every 14 days  Lab orders:  We want to repeat labs after 2-3 months.  We will send you a lab order to remind you once we get closer to that time.    Patient Assistance:    We will sign you up for a [employer] Grant once your medication is approved by [employer].  I will call you with the ID number, then you will take this information to the pharmacy.  They will bill it after your insurance, bringing your copay to $0.  The grant will pay the first $2,500 in a one year period.    ID   BIN [id number]  PCN [id number]  GRP [id number]    Thank you for choosing [facility]

## 2023-05-13 LAB — LIPID PANEL
Chol/HDL Ratio: 3.2 ratio (ref 0.0–4.4)
Cholesterol, Total: 173 mg/dL (ref 100–199)
HDL: 54 mg/dL (ref >39)
LDL Chol Calc (NIH): 100 mg/dL — ABNORMAL HIGH (ref 0–99)
Triglycerides: 106 mg/dL (ref 0–149)
VLDL Cholesterol Cal: 19 mg/dL (ref 5–40)

## 2023-05-15 ENCOUNTER — Telehealth: Payer: Self-pay | Admitting: Pharmacy Technician

## 2023-05-15 ENCOUNTER — Encounter: Payer: Self-pay | Admitting: Pharmacist Clinician (PhC)/ Clinical Pharmacy Specialist

## 2023-05-15 ENCOUNTER — Telehealth: Payer: Self-pay | Admitting: Pharmacist Clinician (PhC)/ Clinical Pharmacy Specialist

## 2023-05-15 ENCOUNTER — Other Ambulatory Visit (HOSPITAL_COMMUNITY): Payer: Self-pay

## 2023-05-15 NOTE — Telephone Encounter (Signed)
 Pharmacy Patient Advocate Encounter  Received notification from [facility] that Prior Authorization for repatha has been APPROVED from 03/17/23 to 11/13/23. Ran test claim, Copay is $5.00. This test claim was processed through [facility]- copay amounts may vary at other pharmacies due to pharmacy/plan contracts, or as the patient moves through the different stages of their insurance plan.   PA #/Case ID/Reference #: [id number]

## 2023-05-15 NOTE — Telephone Encounter (Signed)
 Please do PA for Repatha

## 2023-05-15 NOTE — Telephone Encounter (Signed)
 Pharmacy Patient Advocate Encounter   Received notification from Pt Calls Messages that prior authorization for repatha is required/requested.   Insurance verification completed.   The patient is insured through [facility] .   Per test claim: PA required; PA submitted to above mentioned insurance via CoverMyMeds Key/confirmation #/EOC [id number] Status is pending

## 2023-05-16 NOTE — Telephone Encounter (Signed)
 Re-running benefits for updated deductible.

## 2023-05-17 MED ORDER — REPATHA SURECLICK 140 MG/ML ~~LOC~~ SOAJ
140.0000 mg | SUBCUTANEOUS | 3 refills | Status: AC
Start: 2023-05-17 — End: ?

## 2023-05-17 NOTE — Addendum Note (Signed)
 Addended by: Rosalee Kaufman on: 05/17/2023 10:58 AM   Modules accepted: Orders

## 2023-05-18 ENCOUNTER — Other Ambulatory Visit: Payer: Self-pay | Admitting: Family Medicine

## 2023-05-22 ENCOUNTER — Ambulatory Visit: Payer: Self-pay | Attending: Family Medicine

## 2023-05-22 DIAGNOSIS — M62838 Other muscle spasm: Secondary | ICD-10-CM | POA: Diagnosis not present

## 2023-05-22 DIAGNOSIS — R293 Abnormal posture: Secondary | ICD-10-CM | POA: Diagnosis not present

## 2023-05-22 DIAGNOSIS — M5412 Radiculopathy, cervical region: Secondary | ICD-10-CM | POA: Diagnosis not present

## 2023-05-22 DIAGNOSIS — R2681 Unsteadiness on feet: Secondary | ICD-10-CM | POA: Insufficient documentation

## 2023-05-22 DIAGNOSIS — R2689 Other abnormalities of gait and mobility: Secondary | ICD-10-CM | POA: Diagnosis not present

## 2023-05-22 DIAGNOSIS — M6281 Muscle weakness (generalized): Secondary | ICD-10-CM | POA: Diagnosis not present

## 2023-05-22 DIAGNOSIS — M542 Cervicalgia: Secondary | ICD-10-CM | POA: Insufficient documentation

## 2023-05-22 NOTE — Therapy (Signed)
 OUTPATIENT PHYSICAL THERAPY TREATMENT   Patient Name: Amanda Copeland MRN: [id number] DOB:Nov 15, 1940, 83 y.o., female Today's Date: 05/22/2023   END OF SESSION:  PT End of Session - 05/22/23 1707     Visit Number 9    Date for PT Re-Evaluation 06/01/23    Authorization Type Medicare & BCBS    Progress Note Due on Visit 10    PT Start Time 1535    PT Stop Time 1615    PT Time Calculation (min) 40 min    Activity Tolerance Patient tolerated treatment well;Patient limited by fatigue    Behavior During Therapy [facility] for tasks assessed/performed                     Past Medical History:  Diagnosis Date   Arthritis    Asthma    childhood now returning   Back pain affecting pregnancy 11/02/2014   Benign essential tremor 10/28/2014   Bilateral dry eyes    Breast cancer (HCC)    right   Cancer (HCC)    Breast   Dry eyes    Encounter for Medicare annual wellness exam 05/22/2016   Essential hypertension, benign 10/28/2014   H/O measles    H/O mumps    History of chicken pox    Lumbago 11/02/2014   Mixed hyperlipidemia 10/28/2014   Obesity 10/28/2014   Stenosis of cervical spine    Past Surgical History:  Procedure Laterality Date   BACK SURGERY     BREAST REDUCTION SURGERY Left    CHOLECYSTECTOMY     EYE SURGERY  2008   b/l cataracts removed, in [place]   MASTECTOMY Right    REDUCTION MAMMAPLASTY     REPLACEMENT TOTAL KNEE BILATERAL Bilateral    ROTATOR CUFF REPAIR Left    Patient Active Problem List   Diagnosis Date Noted   Altered mental state 03/17/2023   Chronic bronchitis (HCC) 06/14/2022   Osteopenia 05/25/2021   Scapular dysfunction 03/17/2021   Compression fracture of T11 vertebra (HCC) 01/20/2021   SI joint arthritis (HCC) 01/20/2021   Vitamin D deficiency 01/20/2021   Age-related osteoporosis with current pathological fracture 11/26/2020   Pain of right upper extremity 11/11/2020   Bradycardia 11/11/2020   Neck pain 03/11/2020   Tremor  03/01/2020   Hypothyroid 03/01/2020   Physical deconditioning 11/25/2019   At risk for obstructive sleep apnea 11/25/2019   Primary osteoarthritis of right shoulder 02/28/2019   Other forms of dyspnea 11/22/2018   Educated about COVID-19 virus infection 07/03/2018   Greater trochanteric pain syndrome of right lower extremity 03/18/2018   Chronic nonallergic rhinitis 12/20/2017   Cough 12/20/2017   Right flank pain 12/10/2017   Fatigue 07/12/2017   Obesity, Class III, BMI 40-49.9 (morbid obesity) (HCC) 03/05/2017   Chronic diastolic heart failure (HCC) 11/29/2016   Dyspnea on exertion 10/06/2016   Asthma 10/06/2016   Preventative health care 05/22/2016   Hyperglycemia 08/07/2015   Allergic 08/07/2015   Gout 08/07/2015   Lower abdominal pain 04/21/2015   Low back pain 11/02/2014   Esophageal reflux 10/28/2014   Essential hypertension, benign 10/28/2014   Hyperlipidemia 10/28/2014   Benign essential tremor 10/28/2014   Bilateral dry eyes    Moderate persistent asthma    Arthritis    Cancer (HCC)    History of chicken pox     PCP: Bradd Canary, MD   REFERRING PROVIDER: Sharlene Dory, DO  REFERRING DIAG:  [phone] (ICD-10-CM) - Chronic neck pain  Z61.096E (ICD-10-CM) - Strain of trapezius muscle, unspecified laterality, initial encounter   THERAPY DIAG:  Unsteadiness on feet  Other abnormalities of gait and mobility  Muscle weakness (generalized)  Abnormal posture  RATIONALE FOR EVALUATION AND TREATMENT: Rehabilitation  ONSET DATE: Chronic (neck pain); 03/16/23 - altered mental status and decline in physical functioning  NEXT MD VISIT: 05/29/23 with PCP   SUBJECTIVE:                                                                                                                                                                                                         SUBJECTIVE STATEMENT: No complaints  PAIN: Are you having pain? No and Yes: NPRS  scale: 0/10; up to 10/10  Pain location: R anterior upper arm  Pain description: sharp  Aggravating factors: unpredictable, sometimes lifting arm  Relieving factors: rest   PERTINENT HISTORY:  Chronic neck pain, chronic LBP with h/o surgery for "ruptured disc", lumbar spinal stenosis, OA, B TKA, L RTC repair, chronic diastolic HF, HLD, moderate asthma, DOE, HTN, breast cancer, R mastectomy, benign essential tremor, morbid obesity   PRECAUTIONS: Fall  RED FLAGS: None  HAND DOMINANCE: Right  WEIGHT BEARING RESTRICTIONS: No  FALLS:  Has patient fallen in last 6 months? No - reports "a couple close calls"  LIVING ENVIRONMENT:  Lives with: lives with their spouse Lives in: House/apartment Stairs: Yes: External: 1 steps; none over threshold Has following equipment at home: Single point cane, Environmental consultant - 2 wheeled, Environmental consultant - 4 wheeled, Wheelchair (manual), shower chair, and bed side commode  OCCUPATION: Retired  PLOF: Independent and Leisure: going on cruises, movies, playing cards with friends, swimming in the summer (uses lift to get into pool)     PATIENT GOALS: "To feel like normal again."   OBJECTIVE: (objective measures completed at initial evaluation unless otherwise dated)  DIAGNOSTIC FINDINGS:  03/16/23 - CT Venogram:  IMPRESSION: 1. Negative CT venogram with no evidence for dural venous sinus thrombosis. Previously questioned abnormality on prior MRI felt to be consistent with artifact. 2. No other acute intracranial abnormality. 3. Mild age-related cerebral atrophy with chronic small vessel ischemic disease.  03/16/23 - MR Brain: IMPRESSION: 1. No acute intracranial abnormality. 2. Findings of chronic small vessel ischemia and volume loss. 3. Severely motion degraded MRV. 4. Possible filling defect within the straight sinus and medial transverse sinuses, which could indicate venous sinus thrombosis. CT venogram is recommended.  03/16/23 - CT Head: IMPRESSION: 1. No acute  intracranial abnormality. 2. Generalized atrophy and findings of chronic microvascular disease.  07/07/21 -  MR Cervical spine: IMPRESSION: 1. Degenerative changes of the cervical spine with moderate spinal canal stenosis at C5-6 and C6-7 with mild mass effect on the cord at these levels without cord signal abnormality. 2. Multilevel high-grade neural foraminal narrowing, as described above, severe bilaterally at C5-6.  PATIENT SURVEYS:  ABC scale 870 / 1600 = 54.4 %, 50-80% = moderate level of physical functioning NDI 17 / 50 = 34.0 %, moderate disability   COGNITION: Overall cognitive status: Impaired  SENSATION: WFL  COORDINATION: Movement patterns WFL but slow  POSTURE:  rounded shoulders, forward head, and increased thoracic kyphosis  CERVICAL ROM:   Active ROM 04/12/23  Flexion 46  Extension 41  Right lateral flexion 22  Left lateral flexion 11  Right rotation 52  Left rotation 46   (Blank rows = not tested)  UPPER EXTREMITY ROM: B shoulder limited in overhead ROM d/t OA (Dr. Rennis Chris recommending R TSA)  UPPER EXTREMITY MMT:  MMT Right 04/12/23 Left 04/12/23  Shoulder flexion 3+ 4  Shoulder extension 4 4+  Shoulder abduction 2+ 4  Shoulder adduction    Shoulder internal rotation 4+ 4+  Shoulder external rotation 4 4  Middle trapezius    Lower trapezius    Elbow flexion    Elbow extension    Wrist flexion    Wrist extension    Wrist ulnar deviation    Wrist radial deviation    Wrist pronation    Wrist supination    Grip strength     (Blank rows = not tested)  LOWER EXTREMITY MMT:  (tested in sitting)  MMT Right eval Left eval R 05/22/23 L 05/22/23  Hip flexion 3+ 3+ 4 4  Hip extension 3- 3-    Hip abduction 4- 4- 4 4  Hip adduction 3- 3- 4 4  Hip internal rotation      Hip external rotation      Knee flexion 4- 4- 4+ 4+  Knee extension 4- 4 4 4   Ankle dorsiflexion 4 4 4+ 4+  Ankle plantarflexion      Ankle inversion      Ankle eversion        (Blank rows = not tested)  FUNCTIONAL TESTS: (Remaining test to be completed next visit)  5 times sit to stand: 32.66 sec w/o UE assist: 25.37 sec Timed up and go (TUG): 24.03 sec w/o AD, >13.5 sec indicates a high risk for falls (04/18/22)  10 meter walk test: 20.25 sec with SPC, 19.81 sec w/o AD; Gait speed: 1.62 ft/sec with SPC, 1.66 ft/sec w/o AD; <1.8 ft/sec indicates risk for recurrent falls (04/18/23)  Sharlene Motts Balance Scale: 31/56, < 36 high risk for falls (close to 100%)  Dynamic Gait Index: 9/24, Scores of 19 or less are predictive of falls in older community living adults (04/18/23)  MCTSIB: Condition 1: Avg of 3 trials: 30 sec, Condition 2: Avg of 3 trials: 30 sec, Condition 3: Avg of 3 trials: 24 sec, Condition 4: Avg of 3 trials: 18 sec, and Total Score: 102/120   TODAY'S TREATMENT:  05/22/23  THERAPEUTIC EXERCISE: To improve strength and endurance.  Demonstration, verbal and tactile cues throughout for technique.  NuStep - L5 x 6 min (L UE/B LE - seat #8) LE MMT  Gait: 282ft with SPC limited by SOB- rest required after 11ft NEUROMUSCULAR RE-EDUCATION: To improve proprioception, balance, and kinesthesia. Standing marching 2x10 bil Standing hip abduction x 10 bil Standing hip extension x 10 bil Seated rows RTB x 10  Seated shoulder extension RTB x 10   05/04/23  THERAPEUTIC EXERCISE: To improve strength and endurance.  Demonstration, verbal and tactile cues throughout for technique.  NuStep - L5 x 6 min (L UE/B LE - seat #8) Seated hip openers 2 x 10 - 1st set over TB on floor, progressing to lift over nubby balance pebble for 2nd set to promote increased foot clearance Seated Fitter (1 black/1 blue) leg press x 10 bil  NEUROMUSCULAR RE-EDUCATION: To improve balance, coordination, kinesthesia, posture, proprioception, and reduce fall risk. Standing hip openers 2 x 10 with B UE support on back of chair - 1st set w/o target on floor, progressing to lift over nubby balance pebble for  2nd set to promote increased foot clearance Standing fwd step-over yardstick with weight shift to fwd foot then step back 2 x 10, 2nd set over 1/2 FR - B UE support on the back of 2 chairs - cues for heel strike with fwd foot contact proceeding into flatfoot with weight shift Standing alt toe clears to 1/2 FR x 20 - single UE support on back of chair for balance   05/02/23 THERAPEUTIC EXERCISE: To improve strength and endurance.  Demonstration, verbal and tactile cues throughout for technique.  NuStep - L5 x 6 min (L UE/B LE) 5xSTS- 25.37 seconds Seated scapular rolls x 10 Seated trunk rotation arms crossed x 10 Seated trunk side bends x 10 Seated hip openers reaching over black TB 2 x 10- cues to avoid knee extension when lifting but for marching motion Seated flexion stretch with ornage pball x 10   NEUROMUSCULAR RE-EDUCATION: To improve balance, coordination, kinesthesia, posture, and proprioception.  Standing RTB resisted alternating side-step (band at distal thighs) with UE support on countertop 4x29ft each time Standing bird dog x 10 bil 1HA   04/27/23 THERAPEUTIC EXERCISE: To improve strength and endurance.  Demonstration, verbal and tactile cues throughout for technique.  NuStep - L5 x 6 min (L UE/B LE) Seated RTB B hip ABD/ER clam 2 x 10  NEUROMUSCULAR RE-EDUCATION: To improve balance, coordination, kinesthesia, posture, and proprioception.  Standing 5-way clock reach to colored dots x 5 bil Standing RTB hip flexion march (band at distal thigh) 2 x 10 Standing mini-squat with glute touch to Airex pad on mat table + RTB hip ABD isometric x10, UE support on back of chair Sit to stand from Airex pad on mat table + RTB hip ABD isometric (no UE assist) 2 x 5 Standing RTB resisted alternating side-step (band at distal thighs) with UE support on back of chair x 10 bil   PATIENT EDUCATION:  Education details: continue with current HEP and [employer]  Person  educated: Patient Education method: Explanation Education comprehension: verbalized understanding  HOME EXERCISE PROGRAM: [place]   ASSESSMENT:  CLINICAL IMPRESSION: Yeny "[name]" continues to demonstrate shuffling gait with very limited foot clearance and minimal to no heel strike. Strength reassessed showing improvement but grossly 4/5. She is limited with ambulating longer distances d/t SOB. She became fatigued rather quickly with the interventions today. We did work on her posture as well standing strengthening for single leg stability. [name] will benefit from continued skilled PT to address ongoing deficits to improve mobility and activity tolerance to help reach the maximal level of functional independence and mobility.   OBJECTIVE IMPAIRMENTS: Abnormal gait, decreased activity tolerance, decreased balance, decreased knowledge of condition, decreased mobility, difficulty walking, decreased ROM, decreased strength, increased fascial restrictions, impaired perceived functional ability, increased muscle spasms,  impaired flexibility, impaired UE functional use, improper body mechanics, postural dysfunction, and pain.   ACTIVITY LIMITATIONS: carrying, lifting, bending, standing, squatting, stairs, transfers, bed mobility, bathing, dressing, reach over head, locomotion level, and caring for others  PARTICIPATION LIMITATIONS: meal prep, cleaning, laundry, shopping, and community activity  PERSONAL FACTORS: Age, Fitness, Past/current experiences, Time since onset of injury/illness/exacerbation, and 3+ comorbidities: Chronic neck pain, chronic LBP with h/o surgery for "ruptured disc", lumbar spinal stenosis, OA, B TKA, L RTC repair, chronic diastolic HF, HLD, moderate asthma, DOE, HTN, breast cancer, R mastectomy, benign essential tremor, morbid obesity  are also affecting patient's functional outcome.   REHAB POTENTIAL: Good  CLINICAL DECISION MAKING: Unstable/unpredictable  EVALUATION COMPLEXITY:  High   GOALS: Goals reviewed with patient? Yes  SHORT TERM GOALS: Target date: 05/04/2023  Complete remaining gait and dynamic balance assessment and update LTG's as indicated.  Baseline:  Goal status: MET - 04/18/23  2.  Patient will be independent with initial HEP to improve outcomes and carryover.  Baseline:  Goal status: MET - 04/27/23  3.  Patient will improve 5x STS time to </= 25 seconds for improved efficiency and safety with transfers. Baseline: 32.66 sec Goal status: IN PROGRESS - 05/02/23 - 25.37 seconds   LONG TERM GOALS: Target date: 06/01/2023  Patient will be independent with advanced/ongoing HEP to facilitate ability to maintain/progress functional gains from skilled physical therapy services. Baseline:  Goal status: IN PROGRESS  2.  Patient will be able to ambulate 600' with or w/o LRAD on variable surfaces with good safety to access community.  Baseline:  Goal status: IN PROGRESS  3.  Patient will demonstrate improved B LE strength to >/= 4/5 for improved stability and ease of mobility . Baseline: Refer to above LE MMT table Goal status: IN PROGRESS- 05/22/23  4.  Patient will demonstrate gait speed of >/= 1.8 ft/sec (0.55 m/s) to be a safe limited community ambulator with decreased risk for recurrent falls.  Baseline: 1.62 ft/sec with SPC, 1.66 ft/sec w/o AD (04/18/23)  Goal status: IN PROGRESS  5.  Patient will improve Berg score to >/= 39/56 to improve safety and stability with ADLs in standing and reduce risk for falls. (MCID= 8 points)  Baseline: 31/56 Goal status: IN PROGRESS  6.  Patient will demonstrate at least 15/24 on DGI to improve gait stability and reduce risk for falls. Baseline: 9/24 (04/18/23)  Goal status: REVISED - 04/18/23  7.  Patient will report >/= 68% on ABC scale to demonstrate improved balance confidence with functional mobility and gait. Baseline: 870 / 1600 = 54.4 % Goal status: IN PROGRESS   PLAN:  PT FREQUENCY: 2x/week  PT  DURATION: 8 weeks  PLANNED INTERVENTIONS: 97164- PT Re-evaluation, 97110-Therapeutic exercises, 97530- Therapeutic activity, 97112- Neuromuscular re-education, 97535- Self Care, 52841- Manual therapy, [phone]- Gait training, [phone]- Aquatic Therapy, [phone]- Electrical stimulation (unattended), 97035- Ultrasound, 40347- Ionotophoresis 4mg /ml Dexamethasone, Patient/Family education, Balance training, Stair training, Taping, Dry Needling, Joint mobilization, Spinal mobilization, Cryotherapy, Moist heat, and 97750- Physical performance test or measurement  PLAN FOR NEXT SESSION: progress LE strengthening and balance as tolerated; therapeutic exercise and manual therapy as indicated to address cervical pain and ROM concerns; review [place] program PRN   Darleene Cleaver, PTA 05/22/2023, 5:13 PM

## 2023-05-23 ENCOUNTER — Ambulatory Visit: Admitting: Physical Therapy

## 2023-05-23 ENCOUNTER — Encounter: Payer: Self-pay | Admitting: Physical Therapy

## 2023-05-23 DIAGNOSIS — M6281 Muscle weakness (generalized): Secondary | ICD-10-CM

## 2023-05-23 DIAGNOSIS — R2689 Other abnormalities of gait and mobility: Secondary | ICD-10-CM

## 2023-05-23 DIAGNOSIS — M5412 Radiculopathy, cervical region: Secondary | ICD-10-CM | POA: Diagnosis not present

## 2023-05-23 DIAGNOSIS — M542 Cervicalgia: Secondary | ICD-10-CM | POA: Diagnosis not present

## 2023-05-23 DIAGNOSIS — R293 Abnormal posture: Secondary | ICD-10-CM | POA: Diagnosis not present

## 2023-05-23 DIAGNOSIS — R2681 Unsteadiness on feet: Secondary | ICD-10-CM | POA: Diagnosis not present

## 2023-05-23 NOTE — Therapy (Signed)
 OUTPATIENT PHYSICAL THERAPY TREATMENT Progress Note Reporting Period 04/06/23 to 05/23/23  See note below for Objective Data and Assessment of Progress/Goals.       Patient Name: Anum Palecek MRN: [id number] DOB:[birth year], 83 y.o., female Today's Date: 05/23/2023   END OF SESSION:  PT End of Session - 05/23/23 1438     Visit Number 10    Date for PT Re-Evaluation 06/01/23    Authorization Type Medicare & BCBS    Progress Note Due on Visit 10    PT Start Time 1437    PT Stop Time 1530    PT Time Calculation (min) 53 min    Activity Tolerance Patient tolerated treatment well    Behavior During Therapy [facility] for tasks assessed/performed                      Past Medical History:  Diagnosis Date   Arthritis    Asthma    childhood now returning   Back pain affecting pregnancy 11/02/2014   Benign essential tremor 10/28/2014   Bilateral dry eyes    Breast cancer (HCC)    right   Cancer (HCC)    Breast   Dry eyes    Encounter for Medicare annual wellness exam 05/22/2016   Essential hypertension, benign 10/28/2014   H/O measles    H/O mumps    History of chicken pox    Lumbago 11/02/2014   Mixed hyperlipidemia 10/28/2014   Obesity 10/28/2014   Stenosis of cervical spine    Past Surgical History:  Procedure Laterality Date   BACK SURGERY     BREAST REDUCTION SURGERY Left    CHOLECYSTECTOMY     EYE SURGERY  2008   b/l cataracts removed, in [place]   MASTECTOMY Right    REDUCTION MAMMAPLASTY     REPLACEMENT TOTAL KNEE BILATERAL Bilateral    ROTATOR CUFF REPAIR Left    Patient Active Problem List   Diagnosis Date Noted   Altered mental state 03/17/2023   Chronic bronchitis (HCC) 06/14/2022   Osteopenia 05/25/2021   Scapular dysfunction 03/17/2021   Compression fracture of T11 vertebra (HCC) 01/20/2021   SI joint arthritis (HCC) 01/20/2021   Vitamin D deficiency 01/20/2021   Age-related osteoporosis with current pathological fracture 11/26/2020   Pain  of right upper extremity 11/11/2020   Bradycardia 11/11/2020   Neck pain 03/11/2020   Tremor 03/01/2020   Hypothyroid 03/01/2020   Physical deconditioning 11/25/2019   At risk for obstructive sleep apnea 11/25/2019   Primary osteoarthritis of right shoulder 02/28/2019   Other forms of dyspnea 11/22/2018   Educated about COVID-19 virus infection 07/03/2018   Greater trochanteric pain syndrome of right lower extremity 03/18/2018   Chronic nonallergic rhinitis 12/20/2017   Cough 12/20/2017   Right flank pain 12/10/2017   Fatigue 07/12/2017   Obesity, Class III, BMI 40-49.9 (morbid obesity) (HCC) 03/05/2017   Chronic diastolic heart failure (HCC) 11/29/2016   Dyspnea on exertion 10/06/2016   Asthma 10/06/2016   Preventative health care 05/22/2016   Hyperglycemia 08/07/2015   Allergic 08/07/2015   Gout 08/07/2015   Lower abdominal pain 04/21/2015   Low back pain 11/02/2014   Esophageal reflux 10/28/2014   Essential hypertension, benign 10/28/2014   Hyperlipidemia 10/28/2014   Benign essential tremor 10/28/2014   Bilateral dry eyes    Moderate persistent asthma    Arthritis    Cancer (HCC)    History of chicken pox     PCP: Bradd Canary,  MD   REFERRING PROVIDER: Sharlene Dory, DO  REFERRING DIAG:  [phone] (ICD-10-CM) - Chronic neck pain  S46.819A (ICD-10-CM) - Strain of trapezius muscle, unspecified laterality, initial encounter   THERAPY DIAG:  Unsteadiness on feet  Other abnormalities of gait and mobility  Muscle weakness (generalized)  RATIONALE FOR EVALUATION AND TREATMENT: Rehabilitation  ONSET DATE: Chronic (neck pain); 03/16/23 - altered mental status and decline in physical functioning  NEXT MD VISIT: 05/29/23 with PCP   SUBJECTIVE:                                                                                                                                                                                                         SUBJECTIVE  STATEMENT: No complaints  PAIN: Are you having pain? No and Yes: NPRS scale: 0/10; up to 10/10  Pain location: R anterior upper arm  Pain description: sharp  Aggravating factors: unpredictable, sometimes lifting arm  Relieving factors: rest   PERTINENT HISTORY:  Chronic neck pain, chronic LBP with h/o surgery for "ruptured disc", lumbar spinal stenosis, OA, B TKA, L RTC repair, chronic diastolic HF, HLD, moderate asthma, DOE, HTN, breast cancer, R mastectomy, benign essential tremor, morbid obesity   PRECAUTIONS: Fall  RED FLAGS: None  HAND DOMINANCE: Right  WEIGHT BEARING RESTRICTIONS: No  FALLS:  Has patient fallen in last 6 months? No - reports "a couple close calls"  LIVING ENVIRONMENT:  Lives with: lives with their spouse Lives in: House/apartment Stairs: Yes: External: 1 steps; none over threshold Has following equipment at home: Single point cane, Environmental consultant - 2 wheeled, Environmental consultant - 4 wheeled, Wheelchair (manual), shower chair, and bed side commode  OCCUPATION: Retired  PLOF: Independent and Leisure: going on cruises, movies, playing cards with friends, swimming in the summer (uses lift to get into pool)     PATIENT GOALS: "To feel like normal again."   OBJECTIVE: (objective measures completed at initial evaluation unless otherwise dated)  DIAGNOSTIC FINDINGS:  03/16/23 - CT Venogram:  IMPRESSION: 1. Negative CT venogram with no evidence for dural venous sinus thrombosis. Previously questioned abnormality on prior MRI felt to be consistent with artifact. 2. No other acute intracranial abnormality. 3. Mild age-related cerebral atrophy with chronic small vessel ischemic disease.  03/16/23 - MR Brain: IMPRESSION: 1. No acute intracranial abnormality. 2. Findings of chronic small vessel ischemia and volume loss. 3. Severely motion degraded MRV. 4. Possible filling defect within the straight sinus and medial transverse sinuses, which could indicate venous sinus thrombosis.  CT venogram is recommended.  03/16/23 - CT Head:  IMPRESSION: 1. No acute intracranial abnormality. 2. Generalized atrophy and findings of chronic microvascular disease.  07/07/21 - MR Cervical spine: IMPRESSION: 1. Degenerative changes of the cervical spine with moderate spinal canal stenosis at C5-6 and C6-7 with mild mass effect on the cord at these levels without cord signal abnormality. 2. Multilevel high-grade neural foraminal narrowing, as described above, severe bilaterally at C5-6.  PATIENT SURVEYS:  ABC scale 870 / 1600 = 54.4 %, 50-80% = moderate level of physical functioning NDI 17 / 50 = 34.0 %, moderate disability   COGNITION: Overall cognitive status: Impaired  SENSATION: WFL  COORDINATION: Movement patterns WFL but slow  POSTURE:  rounded shoulders, forward head, and increased thoracic kyphosis  CERVICAL ROM:   Active ROM 04/12/23  Flexion 46  Extension 41  Right lateral flexion 22  Left lateral flexion 11  Right rotation 52  Left rotation 46   (Blank rows = not tested)  UPPER EXTREMITY ROM: B shoulder limited in overhead ROM d/t OA (Dr. Rennis Chris recommending R TSA)  UPPER EXTREMITY MMT:  MMT Right 04/12/23 Left 04/12/23  Shoulder flexion 3+ 4  Shoulder extension 4 4+  Shoulder abduction 2+ 4  Shoulder adduction    Shoulder internal rotation 4+ 4+  Shoulder external rotation 4 4  Middle trapezius    Lower trapezius    Elbow flexion    Elbow extension    Wrist flexion    Wrist extension    Wrist ulnar deviation    Wrist radial deviation    Wrist pronation    Wrist supination    Grip strength     (Blank rows = not tested)  LOWER EXTREMITY MMT:  (tested in sitting)  MMT Right eval Left eval R 05/22/23 L 05/22/23  Hip flexion 3+ 3+ 4 4  Hip extension 3- 3-    Hip abduction 4- 4- 4 4  Hip adduction 3- 3- 4 4  Hip internal rotation      Hip external rotation      Knee flexion 4- 4- 4+ 4+  Knee extension 4- 4 4 4   Ankle dorsiflexion 4 4 4+ 4+   Ankle plantarflexion      Ankle inversion      Ankle eversion       (Blank rows = not tested)  FUNCTIONAL TESTS: (Remaining test to be completed next visit)  5 times sit to stand: 32.66 sec w/o UE assist: 25.37 sec Timed up and go (TUG): 24.03 sec w/o AD, >13.5 sec indicates a high risk for falls (04/18/22)  10 meter walk test: 20.25 sec with SPC, 19.81 sec w/o AD; Gait speed: 1.62 ft/sec with SPC, 1.66 ft/sec w/o AD; <1.8 ft/sec indicates risk for recurrent falls (04/18/23)  Sharlene Motts Balance Scale: 31/56, < 36 high risk for falls (close to 100%)  Dynamic Gait Index: 9/24, Scores of 19 or less are predictive of falls in older community living adults (04/18/23)  MCTSIB: Condition 1: Avg of 3 trials: 30 sec, Condition 2: Avg of 3 trials: 30 sec, Condition 3: Avg of 3 trials: 24 sec, Condition 4: Avg of 3 trials: 18 sec, and Total Score: 102/120   TODAY'S TREATMENT:   05/23/23 THERAPEUTIC EXERCISE: To improve strength and endurance.  Demonstration, verbal and tactile cues throughout for technique. NuStep - L5 x 6 min  PHYSICAL PERFORMANCE TEST or MEASUREMENT:   [facility] PT Assessment - 05/23/23 0001       Berg Balance Test   Sit to Stand Able to stand without  using hands and stabilize independently    Standing Unsupported Able to stand safely 2 minutes    Sitting with Back Unsupported but Feet Supported on Floor or Stool Able to sit safely and securely 2 minutes    Stand to Sit Sits safely with minimal use of hands    Transfers Able to transfer safely, minor use of hands    Standing Unsupported with Eyes Closed Able to stand 10 seconds with supervision    Standing Unsupported with Feet Together Able to place feet together independently and stand for 1 minute with supervision   Able to place feet independently and stand 1 min with supervision   From Standing, Reach Forward with Outstretched Arm Can reach confidently >25 cm (10")    From Standing Position, Pick up Object from Floor Able to pick up  shoe, needs supervision    From Standing Position, Turn to Look Behind Over each Shoulder Looks behind one side only/other side shows less weight shift    Turn 360 Degrees Able to turn 360 degrees safely but slowly    Standing Unsupported, Alternately Place Feet on Step/Stool Needs assistance to keep from falling or unable to try    Standing Unsupported, One Foot in [employer] balance while stepping or standing    Standing on One Leg Unable to try or needs assist to prevent fall    Total Score 38    Berg comment: 37-45 significant (80%)      Dynamic Gait Index   Level Surface Mild Impairment    Change in Gait Speed Moderate Impairment    Gait with Horizontal Head Turns Mild Impairment    Gait with Vertical Head Turns Moderate Impairment    Gait and Pivot Turn Mild Impairment    Step Over Obstacle Severe Impairment    Step Around Obstacles Normal    Steps Severe Impairment    Total Score 11    DGI comment: Scores of 19 or less are predictive of falls in older community living adults             05/22/23  THERAPEUTIC EXERCISE: To improve strength and endurance.  Demonstration, verbal and tactile cues throughout for technique.  NuStep - L5 x 6 min (L UE/B LE - seat #8) LE MMT  Gait: 261ft with SPC limited by SOB- rest required after 47ft NEUROMUSCULAR RE-EDUCATION: To improve proprioception, balance, and kinesthesia. Standing marching 2x10 bil Standing hip abduction x 10 bil Standing hip extension x 10 bil Seated rows RTB x 10 Seated shoulder extension RTB x 10   05/04/23  THERAPEUTIC EXERCISE: To improve strength and endurance.  Demonstration, verbal and tactile cues throughout for technique.  NuStep - L5 x 6 min (L UE/B LE - seat #8) Seated hip openers 2 x 10 - 1st set over TB on floor, progressing to lift over nubby balance pebble for 2nd set to promote increased foot clearance Seated Fitter (1 black/1 blue) leg press x 10 bil  NEUROMUSCULAR RE-EDUCATION: To improve  balance, coordination, kinesthesia, posture, proprioception, and reduce fall risk. Standing hip openers 2 x 10 with B UE support on back of chair - 1st set w/o target on floor, progressing to lift over nubby balance pebble for 2nd set to promote increased foot clearance Standing fwd step-over yardstick with weight shift to fwd foot then step back 2 x 10, 2nd set over 1/2 FR - B UE support on the back of 2 chairs - cues for heel strike with fwd foot contact  proceeding into flatfoot with weight shift Standing alt toe clears to 1/2 FR x 20 - single UE support on back of chair for balance  PATIENT EDUCATION:  Education details: HEP consolidation, continue with current HEP, and [employer]  Person educated: Patient Education method: Explanation Education comprehension: verbalized understanding  HOME EXERCISE PROGRAM: [place]   ASSESSMENT:  CLINICAL IMPRESSION: Karlee "[name]" returns reporting no pain. Today's session involved assessing short and long-term goals as she is nearing the end of her current POC. Though she didn't meet some of her goals today, she was able to improve her Berg Balance Test score by 7 points: 38/56, reducing high risk of falls and improve her DGI score by 2 points: 11/24. She was able to complete STS in under 25 seconds, meeting STG #3. She feels her balance has slightly improved since beginning PT and is able to do more exercises, however, she is still limited with ambulating longer distances due to SOB. She does feel ready to wrap up the POC and transition to HEP/[place]. Therefore, on the next visit, we will plan for final goal assessments and anticipated HEP.  OBJECTIVE IMPAIRMENTS: Abnormal gait, decreased activity tolerance, decreased balance, decreased knowledge of condition, decreased mobility, difficulty walking, decreased ROM, decreased strength, increased fascial restrictions, impaired perceived functional ability, increased muscle spasms, impaired  flexibility, impaired UE functional use, improper body mechanics, postural dysfunction, and pain.   ACTIVITY LIMITATIONS: carrying, lifting, bending, standing, squatting, stairs, transfers, bed mobility, bathing, dressing, reach over head, locomotion level, and caring for others  PARTICIPATION LIMITATIONS: meal prep, cleaning, laundry, shopping, and community activity  PERSONAL FACTORS: Age, Fitness, Past/current experiences, Time since onset of injury/illness/exacerbation, and 3+ comorbidities: Chronic neck pain, chronic LBP with h/o surgery for "ruptured disc", lumbar spinal stenosis, OA, B TKA, L RTC repair, chronic diastolic HF, HLD, moderate asthma, DOE, HTN, breast cancer, R mastectomy, benign essential tremor, morbid obesity  are also affecting patient's functional outcome.   REHAB POTENTIAL: Good  CLINICAL DECISION MAKING: Unstable/unpredictable  EVALUATION COMPLEXITY: High   GOALS: Goals reviewed with patient? Yes  SHORT TERM GOALS: Target date: 05/04/2023  Complete remaining gait and dynamic balance assessment and update LTG's as indicated.  Baseline:  Goal status: MET - 04/18/23  2.  Patient will be independent with initial HEP to improve outcomes and carryover.  Baseline:  Goal status: MET - 04/27/23  3.  Patient will improve 5x STS time to </= 25 seconds for improved efficiency and safety with transfers. Baseline: 32.66 sec Goal status: MET - 05/02/23 - 25.37 seconds; 05/23/23 - 24.97 sec  LONG TERM GOALS: Target date: 06/01/2023  Patient will be independent with advanced/ongoing HEP to facilitate ability to maintain/progress functional gains from skilled physical therapy services. Baseline:  Goal status: MET 05/23/23  2.  Patient will be able to ambulate 600' with or w/o LRAD on variable surfaces with good safety to access community.  Baseline:  Goal status: IN PROGRESS  3.  Patient will demonstrate improved B LE strength to >/= 4/5 for improved stability and ease of  mobility . Baseline: Refer to above LE MMT table Goal status: MET- 05/22/23  4.  Patient will demonstrate gait speed of >/= 1.8 ft/sec (0.55 m/s) to be a safe limited community ambulator with decreased risk for recurrent falls.  Baseline: 1.62 ft/sec with SPC, 1.66 ft/sec w/o AD (04/18/23)  Goal status: IN PROGRESS  5.  Patient will improve Berg score to >/= 39/56 to improve safety and stability with ADLs  in standing and reduce risk for falls. (MCID= 8 points)  Baseline: 31/56 Goal status: PARTIALLY MET; 38/56 - 05/23/23  6.  Patient will demonstrate at least 15/24 on DGI to improve gait stability and reduce risk for falls. Baseline: 9/24 (04/18/23)  Goal status: REVISED - 04/18/23; 11/24 - 05/23/23  7.  Patient will report >/= 68% on ABC scale to demonstrate improved balance confidence with functional mobility and gait. Baseline: 870 / 1600 = 54.4 % Goal status: IN PROGRESS   PLAN:  PT FREQUENCY: 2x/week  PT DURATION: 8 weeks  PLANNED INTERVENTIONS: 97164- PT Re-evaluation, 97110-Therapeutic exercises, 97530- Therapeutic activity, 97112- Neuromuscular re-education, 97535- Self Care, 16109- Manual therapy, [phone]- Gait training, [phone]- Aquatic Therapy, [phone]- Electrical stimulation (unattended), 97035- Ultrasound, 29562- Ionotophoresis 4mg /ml Dexamethasone, Patient/Family education, Balance training, Stair training, Taping, Dry Needling, Joint mobilization, Spinal mobilization, Cryotherapy, Moist heat, and 97750- Physical performance test or measurement  PLAN FOR NEXT SESSION:  Review [place] program PRN; Plan for discharge or 30 day hold  Vittoria Noreen Joseph-Greene, Student-PT 05/23/2023, 5:56 PM

## 2023-05-28 NOTE — Assessment & Plan Note (Deleted)
 Encouraged to get adequate exercise, calcium and vitamin d intake

## 2023-05-28 NOTE — Assessment & Plan Note (Deleted)
 [id number] acceptable, minimize simple carbs. Increase exercise as tolerated.

## 2023-05-28 NOTE — Assessment & Plan Note (Deleted)
No recent exacerbation, no changes 

## 2023-05-28 NOTE — Assessment & Plan Note (Deleted)
 On Levothyroxine, continue to monitor

## 2023-05-28 NOTE — Assessment & Plan Note (Deleted)
 Encourage heart healthy diet such as MIND or DASH diet, increase exercise, avoid trans fats, simple carbohydrates and processed foods, consider a krill or fish or flaxseed oil cap daily.

## 2023-05-28 NOTE — Assessment & Plan Note (Deleted)
 Hydrate and monitor

## 2023-05-28 NOTE — Assessment & Plan Note (Deleted)
 Stable

## 2023-05-28 NOTE — Assessment & Plan Note (Deleted)
 Well controlled, no changes to meds. Encouraged heart healthy diet such as the DASH diet and exercise as tolerated.

## 2023-05-28 NOTE — Assessment & Plan Note (Deleted)
 Supplement and monitor

## 2023-05-29 ENCOUNTER — Encounter

## 2023-05-29 ENCOUNTER — Ambulatory Visit: Payer: Medicare Other | Admitting: Family Medicine

## 2023-05-29 DIAGNOSIS — M8000XD Age-related osteoporosis with current pathological fracture, unspecified site, subsequent encounter for fracture with routine healing: Secondary | ICD-10-CM

## 2023-05-29 DIAGNOSIS — M109 Gout, unspecified: Secondary | ICD-10-CM

## 2023-05-29 DIAGNOSIS — R739 Hyperglycemia, unspecified: Secondary | ICD-10-CM

## 2023-05-29 DIAGNOSIS — I10 Essential (primary) hypertension: Secondary | ICD-10-CM

## 2023-05-29 DIAGNOSIS — E559 Vitamin D deficiency, unspecified: Secondary | ICD-10-CM

## 2023-05-29 DIAGNOSIS — E785 Hyperlipidemia, unspecified: Secondary | ICD-10-CM

## 2023-05-29 DIAGNOSIS — G250 Essential tremor: Secondary | ICD-10-CM

## 2023-05-29 DIAGNOSIS — I5032 Chronic diastolic (congestive) heart failure: Secondary | ICD-10-CM

## 2023-05-29 DIAGNOSIS — E039 Hypothyroidism, unspecified: Secondary | ICD-10-CM

## 2023-05-30 ENCOUNTER — Ambulatory Visit: Payer: Medicare Other | Admitting: Family Medicine

## 2023-05-31 ENCOUNTER — Ambulatory Visit: Admitting: Family Medicine

## 2023-06-01 ENCOUNTER — Ambulatory Visit: Admitting: Physical Therapy

## 2023-06-01 ENCOUNTER — Encounter: Payer: Self-pay | Admitting: Physical Therapy

## 2023-06-01 DIAGNOSIS — M62838 Other muscle spasm: Secondary | ICD-10-CM

## 2023-06-01 DIAGNOSIS — M6281 Muscle weakness (generalized): Secondary | ICD-10-CM | POA: Diagnosis not present

## 2023-06-01 DIAGNOSIS — R2681 Unsteadiness on feet: Secondary | ICD-10-CM | POA: Diagnosis not present

## 2023-06-01 DIAGNOSIS — M5412 Radiculopathy, cervical region: Secondary | ICD-10-CM

## 2023-06-01 DIAGNOSIS — M542 Cervicalgia: Secondary | ICD-10-CM

## 2023-06-01 DIAGNOSIS — R2689 Other abnormalities of gait and mobility: Secondary | ICD-10-CM

## 2023-06-01 DIAGNOSIS — R293 Abnormal posture: Secondary | ICD-10-CM

## 2023-06-01 NOTE — Therapy (Signed)
 OUTPATIENT PHYSICAL THERAPY TREATMENT / DISCHARGE SUMMARY   Patient Name: Amanda Copeland MRN: [id number] DOB:Aug 07, 1940, 83 y.o., female Today's Date: 06/01/2023   END OF SESSION:  PT End of Session - 06/01/23 1402     Visit Number 11    Date for PT Re-Evaluation 06/01/23    Authorization Type Medicare & BCBS    Progress Note Due on Visit 10    PT Start Time 1402    PT Stop Time 1439    PT Time Calculation (min) 37 min    Activity Tolerance Patient tolerated treatment well    Behavior During Therapy [facility] for tasks assessed/performed                       Past Medical History:  Diagnosis Date   Arthritis    Asthma    childhood now returning   Back pain affecting pregnancy 11/02/2014   Benign essential tremor 10/28/2014   Bilateral dry eyes    Breast cancer (HCC)    right   Cancer (HCC)    Breast   Dry eyes    Encounter for Medicare annual wellness exam 05/22/2016   Essential hypertension, benign 10/28/2014   H/O measles    H/O mumps    History of chicken pox    Lumbago 11/02/2014   Mixed hyperlipidemia 10/28/2014   Obesity 10/28/2014   Stenosis of cervical spine    Past Surgical History:  Procedure Laterality Date   BACK SURGERY     BREAST REDUCTION SURGERY Left    CHOLECYSTECTOMY     EYE SURGERY  2008   b/l cataracts removed, in [place]   MASTECTOMY Right    REDUCTION MAMMAPLASTY     REPLACEMENT TOTAL KNEE BILATERAL Bilateral    ROTATOR CUFF REPAIR Left    Patient Active Problem List   Diagnosis Date Noted   Chronic bronchitis (HCC) 06/14/2022   Osteopenia 05/25/2021   Scapular dysfunction 03/17/2021   Compression fracture of T11 vertebra (HCC) 01/20/2021   SI joint arthritis (HCC) 01/20/2021   Vitamin D  deficiency 01/20/2021   Age-related osteoporosis with current pathological fracture 11/26/2020   Pain of right upper extremity 11/11/2020   Bradycardia 11/11/2020   Neck pain 03/11/2020   Tremor 03/01/2020   Hypothyroid 03/01/2020    Physical deconditioning 11/25/2019   At risk for obstructive sleep apnea 11/25/2019   Primary osteoarthritis of right shoulder 02/28/2019   Other forms of dyspnea 11/22/2018   Educated about COVID-19 virus infection 07/03/2018   Greater trochanteric pain syndrome of right lower extremity 03/18/2018   Chronic nonallergic rhinitis 12/20/2017   Cough 12/20/2017   Right flank pain 12/10/2017   Fatigue 07/12/2017   Obesity, Class III, BMI 40-49.9 (morbid obesity) (HCC) 03/05/2017   Chronic diastolic heart failure (HCC) 11/29/2016   Dyspnea on exertion 10/06/2016   Asthma 10/06/2016   Preventative health care 05/22/2016   Hyperglycemia 08/07/2015   Allergic 08/07/2015   Gout 08/07/2015   Lower abdominal pain 04/21/2015   Low back pain 11/02/2014   Esophageal reflux 10/28/2014   Essential hypertension, benign 10/28/2014   Hyperlipidemia 10/28/2014   Benign essential tremor 10/28/2014   Bilateral dry eyes    Moderate persistent asthma    Arthritis    Cancer (HCC)    History of chicken pox     PCP: Neda Balk, MD   REFERRING PROVIDER: Jobe Mulder, DO  REFERRING DIAG:  [phone] (ICD-10-CM) - Chronic neck pain  S46.819A (ICD-10-CM) - Strain  of trapezius muscle, unspecified laterality, initial encounter   THERAPY DIAG:  Unsteadiness on feet  Other abnormalities of gait and mobility  Muscle weakness (generalized)  Abnormal posture  Cervicalgia  Radiculopathy, cervical region  Other muscle spasm  RATIONALE FOR EVALUATION AND TREATMENT: Rehabilitation  ONSET DATE: Chronic (neck pain); 03/16/23 - altered mental status and decline in physical functioning  NEXT MD VISIT: 05/29/23 with PCP   SUBJECTIVE:                                                                                                                                                                                                         SUBJECTIVE STATEMENT: Pt reports "some days are shakier  than others and today is one of those days."  PAIN: Are you having pain? No  PERTINENT HISTORY:  Chronic neck pain, chronic LBP with h/o surgery for "ruptured disc", lumbar spinal stenosis, OA, B TKA, L RTC repair, chronic diastolic HF, HLD, moderate asthma, DOE, HTN, breast cancer, R mastectomy, benign essential tremor, morbid obesity   PRECAUTIONS: Fall  RED FLAGS: None  HAND DOMINANCE: Right  WEIGHT BEARING RESTRICTIONS: No  FALLS:  Has patient fallen in last 6 months? No - reports "a couple close calls"  LIVING ENVIRONMENT:  Lives with: lives with their spouse Lives in: House/apartment Stairs: Yes: External: 1 steps; none over threshold Has following equipment at home: Single point cane, Environmental consultant - 2 wheeled, Environmental consultant - 4 wheeled, Wheelchair (manual), shower chair, and bed side commode  OCCUPATION: Retired  PLOF: Independent and Leisure: going on cruises, movies, playing cards with friends, swimming in the summer (uses lift to get into pool)     PATIENT GOALS: "To feel like normal again."   OBJECTIVE: (objective measures completed at initial evaluation unless otherwise dated)  DIAGNOSTIC FINDINGS:  03/16/23 - CT Venogram:  IMPRESSION: 1. Negative CT venogram with no evidence for dural venous sinus thrombosis. Previously questioned abnormality on prior MRI felt to be consistent with artifact. 2. No other acute intracranial abnormality. 3. Mild age-related cerebral atrophy with chronic small vessel ischemic disease.  03/16/23 - MR Brain: IMPRESSION: 1. No acute intracranial abnormality. 2. Findings of chronic small vessel ischemia and volume loss. 3. Severely motion degraded MRV. 4. Possible filling defect within the straight sinus and medial transverse sinuses, which could indicate venous sinus thrombosis. CT venogram is recommended.  03/16/23 - CT Head: IMPRESSION: 1. No acute intracranial abnormality. 2. Generalized atrophy and findings of chronic microvascular  disease.  07/07/21 - MR Cervical spine: IMPRESSION: 1. Degenerative changes of the cervical spine  with moderate spinal canal stenosis at C5-6 and C6-7 with mild mass effect on the cord at these levels without cord signal abnormality. 2. Multilevel high-grade neural foraminal narrowing, as described above, severe bilaterally at C5-6.  PATIENT SURVEYS:  ABC scale 870 / 1600 = 54.4 %, 50-80% = moderate level of physical functioning NDI 17 / 50 = 34.0 %, moderate disability   06/01/23: ABC Scale: 1050 / 1600 = 65.6 %  COGNITION: Overall cognitive status: Impaired  SENSATION: WFL  COORDINATION: Movement patterns WFL but slow  POSTURE:  rounded shoulders, forward head, and increased thoracic kyphosis  CERVICAL ROM:   Active ROM 04/12/23  Flexion 46  Extension 41  Right lateral flexion 22  Left lateral flexion 11  Right rotation 52  Left rotation 46   (Blank rows = not tested)  UPPER EXTREMITY ROM: B shoulder limited in overhead ROM d/t OA (Dr. Alfredo Ano recommending R TSA)  UPPER EXTREMITY MMT:  MMT Right 04/12/23 Left 04/12/23  Shoulder flexion 3+ 4  Shoulder extension 4 4+  Shoulder abduction 2+ 4  Shoulder adduction    Shoulder internal rotation 4+ 4+  Shoulder external rotation 4 4  Middle trapezius    Lower trapezius    Elbow flexion    Elbow extension    Wrist flexion    Wrist extension    Wrist ulnar deviation    Wrist radial deviation    Wrist pronation    Wrist supination    Grip strength     (Blank rows = not tested)  LOWER EXTREMITY MMT:  (tested in sitting)  MMT Right eval Left eval R 05/22/23 L 05/22/23  Hip flexion 3+ 3+ 4 4  Hip extension 3- 3-    Hip abduction 4- 4- 4 4  Hip adduction 3- 3- 4 4  Hip internal rotation      Hip external rotation      Knee flexion 4- 4- 4+ 4+  Knee extension 4- 4 4 4   Ankle dorsiflexion 4 4 4+ 4+  Ankle plantarflexion      Ankle inversion      Ankle eversion       (Blank rows = not tested)  FUNCTIONAL TESTS:  (Remaining test to be completed next visit)  5 times sit to stand: 32.66 sec w/o UE assist: 25.37 sec Timed up and go (TUG): 24.03 sec w/o AD, >13.5 sec indicates a high risk for falls (04/18/22)  10 meter walk test: 20.25 sec with SPC, 19.81 sec w/o AD; Gait speed: 1.62 ft/sec with SPC, 1.66 ft/sec w/o AD; <1.8 ft/sec indicates risk for recurrent falls (04/18/23)  Randye Buttner Balance Scale: 31/56, < 36 high risk for falls (close to 100%)  Dynamic Gait Index: 9/24, Scores of 19 or less are predictive of falls in older community living adults (04/18/23)  MCTSIB: Condition 1: Avg of 3 trials: 30 sec, Condition 2: Avg of 3 trials: 30 sec, Condition 3: Avg of 3 trials: 24 sec, Condition 4: Avg of 3 trials: 18 sec, and Total Score: 102/120  05/23/23: 5xSTS = 24.97 sec Berg = 38/56 DGI = 11/24  06/01/23: = 17.47 sec with SPC Gait speed = 1.88 ft/sec with SPC   TODAY'S TREATMENT:   06/01/23 THERAPEUTIC EXERCISE: To improve strength and endurance.  Demonstration, verbal and tactile cues throughout for technique.  NuStep - L5 x 6 min (L UE/B LE - seat #8)  THERAPEUTIC ACTIVITIES: To improve functional performance.  Demonstration, verbal and tactile cues throughout for technique. =  17.47 sec with SPC Gait speed = 1.88 ft/sec with SPC ABC Scale: 1050 / 1600 = 65.6 % Goal assessment  SELF CARE: Provided education to prevent loss of gains achieved with physical therapy and to prevent future decline in function. Verbal review of [place] Fall Prevention Program including guidance in continued self-progression and recommended frequency for ongoing performance   05/23/23 THERAPEUTIC EXERCISE: To improve strength and endurance.  Demonstration, verbal and tactile cues throughout for technique. NuStep - L5 x 6 min  PHYSICAL PERFORMANCE TEST or MEASUREMENT:  Berg Balance Test   Sit to Stand Able to stand without using hands and stabilize independently    Standing Unsupported Able to stand safely 2  minutes    Sitting with Back Unsupported but Feet Supported on Floor or Stool Able to sit safely and securely 2 minutes    Stand to Sit Sits safely with minimal use of hands    Transfers Able to transfer safely, minor use of hands    Standing Unsupported with Eyes Closed Able to stand 10 seconds with supervision    Standing Unsupported with Feet Together Able to place feet together independently and stand for 1 minute with supervision   Able to place feet independently and stand 1 min with supervision   From Standing, Reach Forward with Outstretched Arm Can reach confidently >25 cm (10")    From Standing Position, Pick up Object from Floor Able to pick up shoe, needs supervision    From Standing Position, Turn to Look Behind Over each Shoulder Looks behind one side only/other side shows less weight shift    Turn 360 Degrees Able to turn 360 degrees safely but slowly    Standing Unsupported, Alternately Place Feet on Step/Stool Needs assistance to keep from falling or unable to try    Standing Unsupported, One Foot in [employer] balance while stepping or standing    Standing on One Leg Unable to try or needs assist to prevent fall    Total Score 38    Berg comment: 37-45 significant (80%)      Dynamic Gait Index   Level Surface Mild Impairment    Change in Gait Speed Moderate Impairment    Gait with Horizontal Head Turns Mild Impairment    Gait with Vertical Head Turns Moderate Impairment    Gait and Pivot Turn Mild Impairment    Step Over Obstacle Severe Impairment    Step Around Obstacles Normal    Steps Severe Impairment    Total Score 11    DGI comment: Scores of 19 or less are predictive of falls in older community living adults       05/22/23  THERAPEUTIC EXERCISE: To improve strength and endurance.  Demonstration, verbal and tactile cues throughout for technique.  NuStep - L5 x 6 min (L UE/B LE - seat #8) LE MMT  Gait: 289ft with SPC limited by SOB- rest required after  1ft NEUROMUSCULAR RE-EDUCATION: To improve proprioception, balance, and kinesthesia. Standing marching 2x10 bil Standing hip abduction x 10 bil Standing hip extension x 10 bil Seated rows RTB x 10 Seated shoulder extension RTB x 10    PATIENT EDUCATION:  Education details: HEP review, recommended frequency for ongoing HEP at discharge to prevent loss of gains achieved with PT, and [employer]  Person educated: Patient Education method: Explanation Education comprehension: verbalized understanding  HOME EXERCISE PROGRAM: [place]   ASSESSMENT:  CLINICAL IMPRESSION: Syd "[name]" is pleased with her progress and feels that she  is essentially back to her baseline.  Remaining goal assessment completed today.  Gait speed increased to 1.88 ft/sec, placing her just above the recurrent fall risk threshold, meeting LTG #4.  She reports increased gait tolerance with no limitations due to weakness or balance but still limited due to her breathing (DOE) at times.  ABC scale demonstrates improved balance confidence to 65.6% as compared to baseline confidence of 54.4%, but still slightly shy of goal of 68%.  Majority of PT goals now met or partially met and [name] feels ready to transition to her HEP [facility] [employer]) without need for further review, therefore will proceed with discharge from physical therapy for this episode.  OBJECTIVE IMPAIRMENTS: Abnormal gait, decreased activity tolerance, decreased balance, decreased knowledge of condition, decreased mobility, difficulty walking, decreased ROM, decreased strength, increased fascial restrictions, impaired perceived functional ability, increased muscle spasms, impaired flexibility, impaired UE functional use, improper body mechanics, postural dysfunction, and pain.   ACTIVITY LIMITATIONS: carrying, lifting, bending, standing, squatting, stairs, transfers, bed mobility, bathing, dressing, reach over head, locomotion level, and  caring for others  PARTICIPATION LIMITATIONS: meal prep, cleaning, laundry, shopping, and community activity  PERSONAL FACTORS: Age, Fitness, Past/current experiences, Time since onset of injury/illness/exacerbation, and 3+ comorbidities: Chronic neck pain, chronic LBP with h/o surgery for "ruptured disc", lumbar spinal stenosis, OA, B TKA, L RTC repair, chronic diastolic HF, HLD, moderate asthma, DOE, HTN, breast cancer, R mastectomy, benign essential tremor, morbid obesity  are also affecting patient's functional outcome.   REHAB POTENTIAL: Good  CLINICAL DECISION MAKING: Unstable/unpredictable  EVALUATION COMPLEXITY: High   GOALS: Goals reviewed with patient? Yes  SHORT TERM GOALS: Target date: 05/04/2023  Complete remaining gait and dynamic balance assessment and update LTG's as indicated.  Baseline:  Goal status: MET - 04/18/23  2.  Patient will be independent with initial HEP to improve outcomes and carryover.  Baseline:  Goal status: MET - 04/27/23  3.  Patient will improve 5x STS time to </= 25 seconds for improved efficiency and safety with transfers. Baseline: 32.66 sec Goal status: MET - 05/02/23 - 25.37 seconds; 05/23/23 - 24.97 sec  LONG TERM GOALS: Target date: 06/01/2023  Patient will be independent with advanced/ongoing HEP to facilitate ability to maintain/progress functional gains from skilled physical therapy services. Baseline:  Goal status: MET - 05/23/23  2.  Patient will be able to ambulate 600' with or w/o LRAD on variable surfaces with good safety to access community.  Baseline:  Goal status: MET - 06/01/23 - limited primarily by SOB  3.  Patient will demonstrate improved B LE strength to >/= 4/5 for improved stability and ease of mobility . Baseline: Refer to above LE MMT table Goal status: MET - 05/22/23  4.  Patient will demonstrate gait speed of >/= 1.8 ft/sec (0.55 m/s) to be a safe limited community ambulator with decreased risk for recurrent falls.   Baseline: 1.62 ft/sec with SPC, 1.66 ft/sec w/o AD (04/18/23)  Goal status: MET - 06/01/23 - 1.88 ft/sec  5.  Patient will improve Berg score to >/= 39/56 to improve safety and stability with ADLs in standing and reduce risk for falls. (MCID= 8 points)  Baseline: 31/56 Goal status: PARTIALLY MET - 05/23/23 - 38/56   6.  Patient will demonstrate at least 15/24 on DGI to improve gait stability and reduce risk for falls. Baseline: 9/24 (04/18/23)  Goal status: NOT MET - 05/23/23 - 11/24    7.  Patient will report >/= 68% on  ABC scale to demonstrate improved balance confidence with functional mobility and gait. Baseline: 870 / 1600 = 54.4 % Goal status: PARTIALLY MET - 06/01/23 - 1050 / 1600 = 65.6 %   PLAN:  PT FREQUENCY: 2x/week  PT DURATION: 8 weeks  PLANNED INTERVENTIONS: 97164- PT Re-evaluation, 97110-Therapeutic exercises, 97530- Therapeutic activity, 97112- Neuromuscular re-education, 97535- Self Care, 65784- Manual therapy, [phone]- Gait training, [phone]- Aquatic Therapy, [phone]- Electrical stimulation (unattended), 97035- Ultrasound, 10272- Ionotophoresis 4mg /ml Dexamethasone , Patient/Family education, Balance training, Stair training, Taping, Dry Needling, Joint mobilization, Spinal mobilization, Cryotherapy, Moist heat, and 97750- Physical performance test or measurement  PLAN FOR NEXT SESSION:  transition to HEP/[place] program + discharge from PT    PHYSICAL THERAPY DISCHARGE SUMMARY  Visits from Start of Care: 11  Current functional level related to goals / functional outcomes: Refer to above clinical impression and goal assessment.   Remaining deficits: As above.   Education / Equipment: HEP - [place] Fall Prevention Program   Patient agrees to discharge. Patient goals were partially met. Patient is being discharged due to being pleased with the current functional level.   Francisco Irving, PT 06/01/2023, 2:52 PM

## 2023-06-07 ENCOUNTER — Ambulatory Visit: Admitting: Family Medicine

## 2023-06-07 ENCOUNTER — Telehealth: Payer: Self-pay | Admitting: Family Medicine

## 2023-06-07 ENCOUNTER — Encounter: Payer: Self-pay | Admitting: Family Medicine

## 2023-06-07 VITALS — BP 132/76 | HR 61 | Temp 97.0°F | Resp 16 | Ht 61.0 in | Wt 233.4 lb

## 2023-06-07 DIAGNOSIS — E039 Hypothyroidism, unspecified: Secondary | ICD-10-CM

## 2023-06-07 DIAGNOSIS — M1A9XX0 Chronic gout, unspecified, without tophus (tophi): Secondary | ICD-10-CM

## 2023-06-07 DIAGNOSIS — I5032 Chronic diastolic (congestive) heart failure: Secondary | ICD-10-CM

## 2023-06-07 MED ORDER — POTASSIUM CHLORIDE ER 10 MEQ PO TBCR: EXTENDED_RELEASE_TABLET | ORAL | 1 refills | Status: DC

## 2023-06-07 MED ORDER — FUROSEMIDE 20 MG PO TABS
ORAL_TABLET | ORAL | 3 refills | Status: DC
Start: 2023-06-07 — End: 2023-06-29

## 2023-06-07 NOTE — Progress Notes (Signed)
 Chief Complaint  Patient presents with   Follow-up    Follow up    Amanda Copeland is a 83 y.o. female here for gout.  She is here with her spouse.  Currently being treated with allopurinol  200 mg/d. The joint(s) affected include: R great toe Reports compliance. Side effects of medications: None Is usually avoiding seafood, sweet/sugary beverages, alcohol, and red meats.  Hypothyroidism Patient presents for follow-up of hypothyroidism.  Reports compliance with medication. Current symptoms include: denies fatigue, weight changes, heat/cold intolerance, bowel/skin changes or CVS symptoms She believes her dose should be not significantly changed  Patient has a history of diastolic heart failure managed by cardiology team.  She currently takes Lasix  40 mg daily along with 20 mill equivalents of potassium.  Exercise is limited.  Diet is fair overall.  After she takes her Lasix , it takes around 2 hours before she urinates.  No chest pain or new shortness of breath.  Past Medical History:  Diagnosis Date   Arthritis    Asthma    childhood now returning   Back pain affecting pregnancy 11/02/2014   Benign essential tremor 10/28/2014   Bilateral dry eyes    Breast cancer (HCC)    right   Cancer (HCC)    Breast   Dry eyes    Encounter for Medicare annual wellness exam 05/22/2016   Essential hypertension, benign 10/28/2014   H/O measles    H/O mumps    History of chicken pox    Lumbago 11/02/2014   Mixed hyperlipidemia 10/28/2014   Obesity 10/28/2014   Stenosis of cervical spine     BP 132/76 (BP Location: Left Arm, Patient Position: Sitting)   Pulse 61   Temp (!) 97 F (36.1 C) (Oral)   Resp 16   Ht 5\' 1"  (1.549 m)   Wt 233 lb 6.4 oz (105.9 kg)   SpO2 95%   BMI 44.10 kg/m  Gen: Awake, alert, appears stated age Neck: No masses or asymmetry Heart: RRR, no bruits, 3+ pitting bilateral LE edema tapering at the knees Lungs: CTAB, no accessory muscle use MSK: no calf TTP Skin: No  erythema or warmth Psych: Age appropriate judgment and insight, nml mood and affect  Chronic diastolic heart failure (HCC) - Plan: furosemide  (LASIX ) 20 MG tablet, potassium chloride  (KLOR-CON  10) 10 MEQ tablet, Basic metabolic panel with GFR  Hypothyroidism, unspecified type - Plan: TSH, T4, free  Chronic gout involving toe of right foot without tophus, unspecified cause - Plan: Uric acid  She is having some swelling in her legs.  I doubt this.  Water pills adequately dosed.  Of her urinary frequency and timed urination after dosage.  She will take an additional 20 mg for a total of 60 mg to daily, add an extra 10 mill equivalents of potassium.  Check BMP today. Chronic, stable.  Continue levothyroxine  112 mcg daily. Chronic, stable.  Continue allopurinol  200 mg daily.  Check labs today. Reminded to avoid foods like alcohol, sweet beverages, red meat, lunch meat, sea food. F/u in 6 mo. The patient and her spouse voiced understanding and agreement to the plan.  Shellie Dials [place], DO 06/07/23 4:58 PM

## 2023-06-07 NOTE — Telephone Encounter (Signed)
 Pt would like to transfer care due to pcp's availability. Please advise if that is approved.

## 2023-06-07 NOTE — Patient Instructions (Signed)
 Give [place] 2-3 business days to get the results of your labs back.   Keep the diet clean and stay active.  Let us know if you need anything.

## 2023-06-07 NOTE — Telephone Encounter (Signed)
OK w me.  

## 2023-06-08 ENCOUNTER — Encounter: Payer: Self-pay | Admitting: Family Medicine

## 2023-06-08 LAB — BASIC METABOLIC PANEL WITH GFR
BUN: 17 mg/dL (ref 6–23)
CO2: 25 meq/L (ref 19–32)
Calcium: 9.5 mg/dL (ref 8.4–10.5)
Chloride: 104 meq/L (ref 96–112)
Creatinine, Ser: 0.77 mg/dL (ref 0.40–1.20)
GFR: 71.3 mL/min (ref >60.00)
Glucose, Bld: 90 mg/dL (ref 70–99)
Potassium: 5.2 meq/L — ABNORMAL HIGH (ref 3.5–5.1)
Sodium: 139 meq/L (ref 135–145)

## 2023-06-08 LAB — T4, FREE: Free T4: 1.24 ng/dL (ref 0.60–1.60)

## 2023-06-08 LAB — URIC ACID: Uric Acid, Serum: 3.5 mg/dL (ref 2.4–7.0)

## 2023-06-08 LAB — TSH: TSH: 2.24 u[IU]/mL (ref 0.35–5.50)

## 2023-06-09 ENCOUNTER — Other Ambulatory Visit: Payer: Self-pay

## 2023-06-09 DIAGNOSIS — E876 Hypokalemia: Secondary | ICD-10-CM

## 2023-06-12 NOTE — Telephone Encounter (Addendum)
 Patient is ready for scheduling on or after: 06/12/23 (if her last Prolia  injection was actually with us  on 08/09/22. BUY AND BILL  Out-of-pocket cost due at time of visit: $25  Primary: [place] Medicare  Prolia  co-insurance: $0 Admin fee co-insurance: 20% (approximately $25)  Deductible:   Prior Auth: not req'd   Secondary: BCBS other   ** This summary of benefits is an estimation of the patient's out-of-pocket cost. Exact cost may vary based on individual plan coverage.

## 2023-06-14 ENCOUNTER — Other Ambulatory Visit (INDEPENDENT_AMBULATORY_CARE_PROVIDER_SITE_OTHER)

## 2023-06-14 DIAGNOSIS — E876 Hypokalemia: Secondary | ICD-10-CM

## 2023-06-15 ENCOUNTER — Encounter: Payer: Self-pay | Admitting: Family Medicine

## 2023-06-15 LAB — BASIC METABOLIC PANEL WITH GFR
BUN/Creatinine Ratio: 20 (calc) (ref 6–22)
BUN: 20 mg/dL (ref 7–25)
CO2: 26 mmol/L (ref 20–32)
Calcium: 10 mg/dL (ref 8.6–10.4)
Chloride: 101 mmol/L (ref 98–110)
Creat: 0.98 mg/dL — ABNORMAL HIGH (ref 0.60–0.95)
Glucose, Bld: 86 mg/dL (ref 65–99)
Potassium: 5 mmol/L (ref 3.5–5.3)
Sodium: 139 mmol/L (ref 135–146)
eGFR: 57 mL/min/{1.73_m2} — ABNORMAL LOW (ref >=60)

## 2023-06-21 DIAGNOSIS — H353121 Nonexudative age-related macular degeneration, left eye, early dry stage: Secondary | ICD-10-CM | POA: Diagnosis not present

## 2023-06-21 DIAGNOSIS — H35721 Serous detachment of retinal pigment epithelium, right eye: Secondary | ICD-10-CM | POA: Diagnosis not present

## 2023-06-21 DIAGNOSIS — H353112 Nonexudative age-related macular degeneration, right eye, intermediate dry stage: Secondary | ICD-10-CM | POA: Diagnosis not present

## 2023-06-21 DIAGNOSIS — H35373 Puckering of macula, bilateral: Secondary | ICD-10-CM | POA: Diagnosis not present

## 2023-06-21 DIAGNOSIS — H43813 Vitreous degeneration, bilateral: Secondary | ICD-10-CM | POA: Diagnosis not present

## 2023-06-21 DIAGNOSIS — H35363 Drusen (degenerative) of macula, bilateral: Secondary | ICD-10-CM | POA: Diagnosis not present

## 2023-06-29 ENCOUNTER — Other Ambulatory Visit: Payer: Self-pay | Admitting: Family Medicine

## 2023-06-29 MED ORDER — FUROSEMIDE 40 MG PO TABS: 60.0000 mg | ORAL_TABLET | Freq: Every day | ORAL | 1 refills | Status: DC

## 2023-06-30 ENCOUNTER — Other Ambulatory Visit: Payer: Self-pay | Admitting: Family Medicine

## 2023-06-30 DIAGNOSIS — I5032 Chronic diastolic (congestive) heart failure: Secondary | ICD-10-CM

## 2023-07-24 ENCOUNTER — Encounter: Payer: Self-pay | Admitting: Family Medicine

## 2023-07-24 ENCOUNTER — Ambulatory Visit (INDEPENDENT_AMBULATORY_CARE_PROVIDER_SITE_OTHER): Admitting: Family Medicine

## 2023-07-24 ENCOUNTER — Ambulatory Visit: Payer: Self-pay | Admitting: *Deleted

## 2023-07-24 VITALS — BP 130/76 | HR 65 | Temp 98.0°F | Resp 16 | Ht 61.0 in | Wt 229.0 lb

## 2023-07-24 DIAGNOSIS — J029 Acute pharyngitis, unspecified: Secondary | ICD-10-CM | POA: Diagnosis not present

## 2023-07-24 LAB — POCT RAPID STREP A (OFFICE): Rapid Strep A Screen: NEGATIVE

## 2023-07-24 MED ORDER — METHYLPREDNISOLONE ACETATE 80 MG/ML IJ SUSP: 80.0000 mg | Freq: Once | INTRAMUSCULAR | Status: AC

## 2023-07-24 MED ORDER — COLCHICINE 0.6 MG PO TABS: ORAL_TABLET | ORAL | 1 refills | Status: AC

## 2023-07-24 NOTE — Patient Instructions (Signed)
OK to take Tylenol 1000 mg (2 extra strength tabs) or 975 mg (3 regular strength tabs) every 6 hours as needed.  Consider throat lozenges, salt water gargles and an air humidifier for symptomatic care.   Let us know if you need anything.

## 2023-07-24 NOTE — Progress Notes (Signed)
 SUBJECTIVE:   Amanda Copeland is a 83 y.o. female presents to the clinic for:  Chief Complaint  Patient presents with   Sore Throat    Sore Throat    Complains of sore throat for 2 days.  Other associated symptoms: sore throat and shortness of breath.  Denies: sinus congestion, sinus pain, rhinorrhea, ear pain, ear drainage, wheezing, myalgia, and fevers Sick Contacts: none known Therapy to date: cough drops, Advil  Social History   Tobacco Use  Smoking Status Never  Smokeless Tobacco Never    Patient's medications, allergies, past medical, surgical, social and family histories were reviewed and updated as appropriate.  OBJECTIVE:  BP 130/76 (BP Location: Left Arm, Patient Position: Sitting)   Pulse 65   Temp 98 F (36.7 C) (Oral)   Resp 16   Ht 5' 1 (1.549 m)   Wt 229 lb (103.9 kg)   SpO2 95%   BMI 43.27 kg/m  General: Awake, alert, appearing stated age Eyes: conjunctivae and sclerae clear Ears: normal TMs bilaterally Nose: no visible exudate, no sinus TTP bilaterally Oropharynx: lips, mucosa, and tongue normal; teeth and gums normal, no pharyngeal exudate or erythema Neck: supple, no significant adenopathy Lungs: clear to auscultation, no wheezes, rales or rhonchi, symmetric air entry, normal effort Heart: RRR Psych: Age appropriate judgment and insight  ASSESSMENT/PLAN:  Sore throat - Plan: POCT rapid strep A, methylPREDNISolone  acetate (DEPO-MEDROL ) injection 80 mg  Rapid strep test is negative.  Would prefer a single dose of Depo-Medrol  rather than prednisone .  Likely safer than NSAIDs.  Air humidifier, salt water gargles, oral menthol products for symptomatic control.  Continue to practice good hand hygiene and push fluids. Acetaminophen  for pain. F/u prn. Pt voiced understanding and agreement to the plan.  Shellie Dials [place], DO 07/24/23 12:00 PM

## 2023-07-24 NOTE — Telephone Encounter (Signed)
    FYI Only or Action Required?: Action required by provider  Patient was last seen in primary care on 06/07/2023 by Jobe Mulder, DO. Called Nurse Triage reporting Sore Throat. Symptoms began today. Interventions attempted: Nothing. Symptoms are: gradually worsening.  Triage Disposition: See Physician Within 24 Hours  Patient/caregiver understands and will follow disposition?: Yes                    Copied from CRM [id number]. Topic: Clinical - Red Word Triage >> Jul 24, 2023  9:10 AM Kita Perish H wrote: [facility] that prompted transfer to Nurse Triage: Bronchitis, sore throat worse today that's moved into chest, hard to swallow, chest pressure Reason for Disposition  SEVERE (e.g., excruciating) throat pain  Answer Assessment - Initial Assessment Questions 1. ONSET: When did the throat start hurting? (Hours or days ago)      Worsening today s/p bronchitis, per patient husband on DPR  2. SEVERITY: How bad is the sore throat? (Scale 1-10; mild, moderate or severe)   - MILD (1-3):  Doesn't interfere with eating or normal activities.   - MODERATE (4-7): Interferes with eating some solids and normal activities.   - SEVERE (8-10):  Excruciating pain, interferes with most normal activities.   - SEVERE WITH DYSPHAGIA (10): Can't swallow liquids, drooling.     Pain with swallowing  3. STREP EXPOSURE: Has there been any exposure to strep within the past week? If Yes, ask: What type of contact occurred?      na 4.  VIRAL SYMPTOMS: Are there any symptoms of a cold, such as a runny nose, cough, hoarse voice or red eyes?      Cough , hoarse sore throat . 5. FEVER: Do you have a fever? If Yes, ask: What is your temperature, how was it measured, and when did it start?     no 6. PUS ON THE TONSILS: Is there pus on the tonsils in the back of your throat?     unknown 7. OTHER SYMPTOMS: Do you have any other symptoms? (e.g., difficulty breathing, headache,  rash)     Chest pressure with cough, sore throat pain swallowing ,  8. PREGNANCY: Is there any chance you are pregnant? When was your last menstrual period?     Na   Appt scheduled today with Dr. Gwenette Lennox. Patient husband reports Dr. Gwenette Lennox is patient PCP not Dr. Rodrick Clapper as listed in chart.  Protocols used: Sore Throat-A-AH

## 2023-07-28 ENCOUNTER — Telehealth: Payer: Self-pay | Admitting: Pharmacist

## 2023-07-28 NOTE — Telephone Encounter (Signed)
 Called and gave verbal to [facility] for product replacement for Repatha 

## 2023-07-31 ENCOUNTER — Other Ambulatory Visit: Payer: Self-pay | Admitting: Family Medicine

## 2023-07-31 DIAGNOSIS — I5032 Chronic diastolic (congestive) heart failure: Secondary | ICD-10-CM

## 2023-08-07 ENCOUNTER — Other Ambulatory Visit: Payer: Self-pay | Admitting: Pharmacist Clinician (PhC)/ Clinical Pharmacy Specialist

## 2023-08-07 ENCOUNTER — Encounter: Payer: Self-pay | Admitting: Pharmacist Clinician (PhC)/ Clinical Pharmacy Specialist

## 2023-08-07 DIAGNOSIS — E782 Mixed hyperlipidemia: Secondary | ICD-10-CM

## 2023-08-09 DIAGNOSIS — E782 Mixed hyperlipidemia: Secondary | ICD-10-CM | POA: Diagnosis not present

## 2023-08-10 ENCOUNTER — Ambulatory Visit: Payer: Self-pay | Admitting: Pharmacist Clinician (PhC)/ Clinical Pharmacy Specialist

## 2023-08-10 LAB — LIPID PANEL
Chol/HDL Ratio: 2.4 ratio (ref 0.0–4.4)
Cholesterol, Total: 125 mg/dL (ref 100–199)
HDL: 52 mg/dL (ref >39)
LDL Chol Calc (NIH): 52 mg/dL (ref 0–99)
Triglycerides: 116 mg/dL (ref 0–149)
VLDL Cholesterol Cal: 21 mg/dL (ref 5–40)

## 2023-08-10 LAB — HEPATIC FUNCTION PANEL
ALT: 9 IU/L (ref 0–32)
AST: 15 IU/L (ref 0–40)
Albumin: 3.9 g/dL (ref 3.7–4.7)
Alkaline Phosphatase: 113 IU/L (ref 44–121)
Bilirubin Total: 0.3 mg/dL (ref 0.0–1.2)
Bilirubin, Direct: 0.14 mg/dL (ref 0.00–0.40)
Total Protein: 6.4 g/dL (ref 6.0–8.5)

## 2023-08-22 ENCOUNTER — Other Ambulatory Visit: Payer: Self-pay | Admitting: Family Medicine

## 2023-08-22 DIAGNOSIS — I5032 Chronic diastolic (congestive) heart failure: Secondary | ICD-10-CM

## 2023-08-22 NOTE — Telephone Encounter (Signed)
 Patient's husband reports she does take 53 MEQ and also Dr. Frann is her pcp now and no refills should be coming to Dr. Domenica. Just an FYI

## 2023-08-23 ENCOUNTER — Telehealth: Payer: Self-pay | Admitting: Cardiology

## 2023-08-23 NOTE — Progress Notes (Signed)
 Cardiology Office Note    Date:  08/25/2023  ID:  Ariani Seier, DOB Dec 16, 1940, MRN [id number] PCP:  Frann Mabel Mt, DO  Cardiologist:  Redell Shallow, MD  Electrophysiologist:  None   Chief Complaint: Fatigue and bradycardia   History of Present Illness: .    Amanda Copeland is a 83 y.o. female with visit-pertinent history of CAD, hyperlipidemia, chronic diastolic heart failure, hypertension.  Patient was previously followed by Dr. Dann.  Carotid Dopplers in May 2019 showed less than 50% bilaterally.  Coronary CTA in May 2021 showed minimal nonobstructive coronary disease, calcium score 26 which was 30th percentile.  Echo in May 2021 showed normal LV function, grade 1 diastolic dysfunction.  CPX in October 2021 consistent with submaximal effort, mildly reduced functional capacity compared to sedentary controls, ventilatory limitation related to her body habitus but no clear cardiopulmonary limitation and possible chronotropic incompetence.  Patient was seen by Dr. Shallow on 04/25/2023 to establish care.  Patient reported dyspnea that was unchanged, denied any orthopnea, PND, pedal edema, chest pain or syncope.  Today patient presents regarding increased fatigue and low heart rates.  Patient reports that she has been monitoring her heart rate at home and over the last 2 weeks has noticed that her heart rate has been consistently in the 40s, she endorses significant fatigue, reports that she is regularly falling asleep throughout the day.  On review of her medication list she is on propranolol  40 mg twice daily, patient notes that she is been on this medication for a significant amount of time as a result of her Parkinson's to treat essential tremors.  Patient denies any palpitations, presyncope, syncope, dizziness or lightheadedness.  She denies any chest pain and reports chronic and stable shortness of breath.  ROS: .   Today she denies chest pain, shortness of breath, lower  extremity edema, palpitations, melena, hematuria, hemoptysis, diaphoresis, weakness, presyncope, syncope, orthopnea, and PND.  All other systems are reviewed and otherwise negative. Studies Reviewed: SABRA    EKG:  EKG is ordered today, personally reviewed, demonstrating  EKG Interpretation Date/Time:  Friday August 25 2023 14:29:20 EDT Ventricular Rate:  42 PR Interval:  232 QRS Duration:  102 QT Interval:  468 QTC Calculation: 390 R Axis:   52  Text Interpretation: Marked sinus bradycardia with 1st degree A-V block Cannot rule out Anterior infarct , age undetermined Confirmed by Havish Petties [phone]) on 08/25/2023 3:55:03 PM   CV Studies: Cardiac studies reviewed are outlined and summarized above. Otherwise please see EMR for full report. Cardiac Studies & Procedures   ______________________________________________________________________________________________     ECHOCARDIOGRAM  ECHOCARDIOGRAM COMPLETE 06/25/2019  Narrative ECHOCARDIOGRAM REPORT    Patient Name:   Amanda Copeland  Date of Exam: 06/25/2019 Medical Rec #:  [id number]     Height:       61.0 in Accession #:    [id number]    Weight:       252.8 lb Date of Birth:  12-26-1940     BSA:          2.086 m Patient Age:    78 years      BP:           128/72 mmHg Patient Gender: F             HR:           65 bpm. Exam Location:  [address]  Procedure: 2D Echo, Cardiac Doppler, Color Doppler and Intracardiac Opacification Agent  Indications:  R06.02 Shortness of breath  History:        Patient has prior history of Echocardiogram examinations, most recent 06/01/2016. Risk Factors:Hypertension and Dyslipidemia. History of Breast Cancer.  Sonographer:    Carl Rodgers-Jones RDCS Referring Phys: [id number] JAYADEEP S VARANASI  IMPRESSIONS   1. Left ventricular ejection fraction, by estimation, is 60 to 65%. The left ventricle has normal function. The left ventricle has no regional wall motion abnormalities. Left ventricular  diastolic parameters are consistent with Grade I diastolic dysfunction (impaired relaxation). Elevated left ventricular end-diastolic pressure. 2. Right ventricular systolic function is normal. The right ventricular size is normal. There is normal pulmonary artery systolic pressure. 3. The mitral valve is normal in structure. Trivial mitral valve regurgitation. No evidence of mitral stenosis. 4. The aortic valve is tricuspid. Aortic valve regurgitation is not visualized. No aortic stenosis is present. 5. The inferior vena cava is normal in size with greater than 50% respiratory variability, suggesting right atrial pressure of 3 mmHg.  FINDINGS Left Ventricle: Left ventricular ejection fraction, by estimation, is 60 to 65%. The left ventricle has normal function. The left ventricle has no regional wall motion abnormalities. Definity  contrast agent was given IV to delineate the left ventricular endocardial borders. The left ventricular internal cavity size was normal in size. There is no left ventricular hypertrophy. Left ventricular diastolic parameters are consistent with Grade I diastolic dysfunction (impaired relaxation). Elevated left ventricular end-diastolic pressure.  Right Ventricle: The right ventricular size is normal. No increase in right ventricular wall thickness. Right ventricular systolic function is normal. There is normal pulmonary artery systolic pressure. The tricuspid regurgitant velocity is 2.37 m/s, and with an assumed right atrial pressure of 10 mmHg, the estimated right ventricular systolic pressure is 32.5 mmHg.  Left Atrium: Left atrial size was normal in size.  Right Atrium: Right atrial size was normal in size.  Pericardium: There is no evidence of pericardial effusion.  Mitral Valve: The mitral valve is normal in structure. Normal mobility of the mitral valve leaflets. Trivial mitral valve regurgitation. No evidence of mitral valve stenosis.  Tricuspid Valve: The  tricuspid valve is normal in structure. Tricuspid valve regurgitation is trivial. No evidence of tricuspid stenosis.  Aortic Valve: The aortic valve is tricuspid. Aortic valve regurgitation is not visualized. No aortic stenosis is present.  Pulmonic Valve: The pulmonic valve was normal in structure. Pulmonic valve regurgitation is trivial. No evidence of pulmonic stenosis.  Aorta: The aortic root is normal in size and structure.  Venous: The inferior vena cava is normal in size with greater than 50% respiratory variability, suggesting right atrial pressure of 3 mmHg.  IAS/Shunts: No atrial level shunt detected by color flow Doppler.   LEFT VENTRICLE PLAX 2D LVIDd:         4.80 cm  Diastology LVIDs:         3.30 cm  LV e' lateral:   6.09 cm/s LV PW:         0.70 cm  LV E/e' lateral: 13.3 LV IVS:        0.70 cm  LV e' medial:    5.66 cm/s LVOT diam:     1.90 cm  LV E/e' medial:  14.3 LV SV:         77 LV SV Index:   37 LVOT Area:     2.84 cm   RIGHT VENTRICLE RV Basal diam:  3.90 cm RV S prime:     12.60 cm/s TAPSE (M-mode): 2.7 cm  LEFT ATRIUM             Index       RIGHT ATRIUM           Index LA diam:        4.50 cm 2.16 cm/m  RA Area:     13.00 cm LA Vol (A2C):   51.9 ml 24.87 ml/m RA Volume:   36.60 ml  17.54 ml/m LA Vol (A4C):   52.3 ml 25.07 ml/m LA Biplane Vol: 53.1 ml 25.45 ml/m AORTIC VALVE LVOT Vmax:   108.00 cm/s LVOT Vmean:  75.600 cm/s LVOT VTI:    0.271 m  AORTA Ao Root diam: 3.10 cm  MITRAL VALVE                TRICUSPID VALVE MV Area (PHT): 4.06 cm     TR Peak grad:   22.5 mmHg MV Decel Time: 187 msec     TR Vmax:        237.00 cm/s MV E velocity: 81.00 cm/s MV A velocity: 115.00 cm/s  SHUNTS MV E/A ratio:  0.70         Systemic VTI:  0.27 m Systemic Diam: 1.90 cm  Amanda Scarce MD Electronically signed by Amanda Scarce MD Signature Date/Time: 06/25/2019/6:13:39 PM    Final      CT SCANS  CT CORONARY MORPH W/CTA COR W/SCORE  03/14/2019  Addendum 03/14/2019  9:51 PM ADDENDUM REPORT: 03/14/2019 21:47  CLINICAL DATA:  83 year old female with HTN, HLP and chest pain.  EXAM: Cardiac/Coronary  CTA  TECHNIQUE: The patient was scanned on a [employer].  FINDINGS: A 100 kV prospective scan was triggered in the descending thoracic aorta at 111 HU's. Axial non-contrast 3 mm slices were carried out through the heart. The data set was analyzed on a dedicated work station and scored using the Agatson method. Gantry rotation speed was 250 msecs and collimation was .6 mm. 20 mg of PO Propranolol  and 0.8 mg of sl NTG was given. The 3D data set was reconstructed in 5% intervals of the 67-82 % of the R-R cycle. Diastolic phases were analyzed on a dedicated work station using MPR, MIP and VRT modes. The patient received 80 cc of contrast.  Aorta: Normal size. Minimal diffuse calcifications. No dissection.  Aortic Valve:  Trileaflet.  Trivial calcifications.  Coronary Arteries:  Normal coronary origin.  Right dominance.  RCA is a large dominant artery that gives rise to PDA and PLA. There are minimal irregularities.  Left main is a large artery that gives rise to LAD and LCX arteries. There is minimal calcified plaque with stenosis 0-25%.  LAD is a large vessel that gives rise to two small diagonal arteries and has only minimal irregularities.  LCX is a non-dominant artery that gives rise to one large OM1 branch. There is no plaque.  Other findings:  Normal pulmonary vein drainage into the left atrium.  Normal left atrial appendage without a thrombus.  Normal size of the pulmonary artery.  IMPRESSION: 1. Coronary calcium score of 26. This was 30 percentile for age and sex matched control.  2. Normal coronary origin with right dominance.  3. CAD-RADS 1. Minimal non-obstructive CAD (0-24%). Consider non-atherosclerotic causes of chest pain. Consider preventive therapy and risk factor  modification.   Electronically Signed By: Leim Moose On: 03/14/2019 21:47  Narrative EXAM: OVER-READ INTERPRETATION  CT CHEST  The following report is an over-read performed by radiologist Dr. Jackquline Boxer of [facility] on 03/14/2019. This over-read does not include interpretation of cardiac or coronary anatomy or pathology. The coronary CTA interpretation by the cardiologist is attached.  COMPARISON:  None.  FINDINGS: Limited view of the lung parenchyma demonstrates no suspicious nodularity. Airways are normal.  Limited view of the mediastinum demonstrates no adenopathy. Esophagus normal.  Limited view of the upper abdomen unremarkable.  Limited view of the skeleton and chest wall is unremarkable. Degenerative osteophytosis of the spine.  IMPRESSION: No significant extracardiac findings.  Electronically Signed: By: Jackquline Boxer M.D. On: 03/14/2019 10:31     ______________________________________________________________________________________________       Current Reported Medications:.    Current Meds  Medication Sig   allopurinol  (ZYLOPRIM ) 100 MG tablet TAKE 2 TABLETS BY MOUTH EVERY DAY   AMBULATORY NON FORMULARY MEDICATION Rollator  Dx: Gait instability   amLODipine  (NORVASC ) 5 MG tablet Take 1 tablet (5 mg total) by mouth daily.   aspirin  81 MG tablet Take 81 mg by mouth daily.   colchicine  0.6 MG tablet Take 2 tabs at first sign of a flare and repeat 1 tab 1 hr later. Take 1 tab twice daily until flare resolves. (Patient taking differently: Take 0.6 mg by mouth daily as needed (for gout flareup). Take 2 tabs at first sign of a flare and repeat 1 tab 1 hr later. Take 1 tab twice daily until flare resolves.)   diclofenac  Sodium (PENNSAID ) 2 % SOLN Apply 2 Pump (40 mg total) topically 2 (two) times daily as needed (Pain).   Evolocumab  (REPATHA  SURECLICK) 140 MG/ML SOAJ Inject 140 mg into the skin every 14 (fourteen) days.    ezetimibe  (ZETIA ) 10 MG tablet TAKE 1 TABLET BY MOUTH EVERY DAY   furosemide  (LASIX ) 40 MG tablet Take 1.5 tablets (60 mg total) by mouth daily.   levothyroxine  (SYNTHROID ) 112 MCG tablet TAKE 1 TABLET BY MOUTH EVERY DAY BEFORE BREAKFAST   Multiple Vitamins-Minerals (ICAPS AREDS 2) CAPS Take 1 capsule by mouth 2 (two) times daily.   potassium chloride  (KLOR-CON ) 10 MEQ tablet TAKE 1 TABLET WITH THE 20 MEQ TABLET FOR A TOTAL OF 30 MEQ WITH THE 60 MG OF LASIX .   propranolol  (INDERAL ) 20 MG tablet Take 1 tablet (20 mg total) by mouth 2 (two) times daily.   [DISCONTINUED] propranolol  (INDERAL ) 40 MG tablet Take 1 tablet (40 mg total) by mouth 2 (two) times daily.    Physical Exam:    VS:  BP (!) 115/58   Pulse (!) 48   Ht 4' 9 (1.448 m)   Wt 226 lb (102.5 kg)   SpO2 96%   BMI 48.91 kg/m    Wt Readings from Last 3 Encounters:  08/25/23 226 lb (102.5 kg)  07/24/23 229 lb (103.9 kg)  06/07/23 233 lb 6.4 oz (105.9 kg)    GEN: Well nourished, well developed in no acute distress NECK: No JVD; No carotid bruits CARDIAC: Slow RR, no murmurs, rubs, gallops RESPIRATORY:  Clear to auscultation without rales, wheezing or rhonchi  ABDOMEN: Soft, non-tender, non-distended EXTREMITIES:  No edema; No acute deformity     Asessement and Plan:.    Bradycardia/fatigue: Patient reports that for the last few weeks her heart rate has consistently been in the 40s as noted on her Apple Watch.  EKG today indicates sinus bradycardia at 42 bpm.  Patient reports that she has been having increased fatigue, is regularly sleeping through the day.  She denies any dizziness, lightheadedness, presyncope or syncope.  Patient has been  on propranolol  40 mg twice daily for tremors, after long discussion with patient we will decrease propranolol  to 20 mg twice daily, she will also follow-up with her neurologist regarding this.  Patient agreeable to 2-week cardiac monitor.  Check CBC, BMET, Mag and TSH. Reviewed ED precautions.    CAD: Mild on CTA in 2021. Stable with no anginal symptoms. No indication for ischemic evaluation.  Heart healthy diet and regular cardiovascular exercise encouraged.  Continue aspirin   Chronic diastolic congestive heart failure: Patient is currently euvolemic and well compensated on exam.   Hypertension: Blood pressure today 115/58.  Continue current antihypertensive regimen.  Hyperlipidemia: Patient with history of statin intolerance.  She is on Zetia  and Repatha .  Dyspnea: Previous CPX did not suggest cardiopulmonary limitation no obstructive coronary disease on CTA and LV function is normal.    Disposition: F/u with Saben Donigan, NP in 6-8 weeks   Signed, Detavious Rinn D Pao Haffey, NP

## 2023-08-23 NOTE — Telephone Encounter (Signed)
 Called the patient x 2- answer, then all you hear is someone talking in the background. Sounded like they were talking about fluid restrictions? May be at an appointment or something, Unsure why they answer, then hang up after a few seconds.....   Left message on spouse's voicemail asking for pt to call us  back, left call back number

## 2023-08-23 NOTE — Telephone Encounter (Signed)
 Spoke with pt who reports current HR 49 according to watch.  Pt does not have capability of checking BP or manual pulse.  Pt also complains of increasing fatigue over the past week.  Pt denies current CP, SOB, edema or dizziness.   Pt advised HR at her last office visit 04/2023 was 51.  Pt has taken medications as prescribed.  Pt reports she takes Propranolol  40mg  bid for non-essential tremors prescribed by Dr Tat.  Pt reports she is drinking 16 oz of water daily with 2 cups of coffee, 1 cup of tea and sometimes one Pepsi. Appointment scheduled with Katlyn West for 08/25/2023 for further evaluation.  ED precautions provided.  Pt verbalizes understanding and agrees with current plan.

## 2023-08-23 NOTE — Telephone Encounter (Signed)
 Correct, she transferred to me unless Dr. Domenica blocked the switch after I approved. Her chart is up to date with what I want her on. OK to refill what she needs. Thx.

## 2023-08-23 NOTE — Telephone Encounter (Signed)
 STAT if HR is under 50 or over 120 (normal HR is 60-100 beats per minute)  What is your heart rate? Yesterday, her watch showed it was under 40- today it is 52     you have a log of your heart rate readings (document readings)?   Do you have any other symptoms? Sleep all the times, can not stay awake

## 2023-08-25 ENCOUNTER — Ambulatory Visit: Attending: Cardiology

## 2023-08-25 ENCOUNTER — Ambulatory Visit: Attending: Cardiology | Admitting: Cardiology

## 2023-08-25 ENCOUNTER — Encounter: Payer: Self-pay | Admitting: Cardiology

## 2023-08-25 VITALS — BP 115/58 | HR 48 | Ht <= 58 in | Wt 226.0 lb

## 2023-08-25 DIAGNOSIS — I5032 Chronic diastolic (congestive) heart failure: Secondary | ICD-10-CM | POA: Diagnosis not present

## 2023-08-25 DIAGNOSIS — I2510 Atherosclerotic heart disease of native coronary artery without angina pectoris: Secondary | ICD-10-CM | POA: Diagnosis not present

## 2023-08-25 DIAGNOSIS — R001 Bradycardia, unspecified: Secondary | ICD-10-CM | POA: Insufficient documentation

## 2023-08-25 DIAGNOSIS — E782 Mixed hyperlipidemia: Secondary | ICD-10-CM | POA: Diagnosis not present

## 2023-08-25 DIAGNOSIS — I10 Essential (primary) hypertension: Secondary | ICD-10-CM | POA: Diagnosis not present

## 2023-08-25 MED ORDER — PROPRANOLOL HCL 20 MG PO TABS: 20.0000 mg | ORAL_TABLET | Freq: Two times a day (BID) | ORAL | 3 refills | Status: AC

## 2023-08-25 NOTE — Progress Notes (Unsigned)
Enrolled patient for a 14 day Zio XT monitor to be mailed to patients home  [place] to read

## 2023-08-25 NOTE — Patient Instructions (Signed)
 Medication Instructions:  Decrease Propanolol to 20 mg twice a day *If you need a refill on your cardiac medications before your next appointment, please call your pharmacy*  Lab Work: Today we are going to draw CBC, Bmet, TSH, and Mag If you have labs (blood work) drawn today and your tests are completely normal, you will receive your results only by: MyChart Message (if you have MyChart) OR A paper copy in the mail If you have any lab test that is abnormal or we need to change your treatment, we will call you to review the results.  Testing/Procedures: Check next page  Follow-Up: At [facility], you and your health needs are our priority.  As part of our continuing mission to provide you with exceptional heart care, our providers are all part of one team.  This team includes your primary Cardiologist (physician) and Advanced Practice Providers or APPs (Physician Assistants and Nurse Practitioners) who all work together to provide you with the care you need, when you need it.  Your next appointment:   6-8 week(s)  Provider:   Katlyn West, NP   We recommend signing up for the patient portal called MyChart.  Sign up information is provided on this After Visit Summary.  MyChart is used to connect with patients for Virtual Visits (Telemedicine).  Patients are able to view lab/test results, encounter notes, upcoming appointments, etc.  Non-urgent messages can be sent to your provider as well.   To learn more about what you can do with MyChart, go to [email or web address].   Other Instructions ZIO AT Long term monitor-Live Telemetry  Your physician has requested you wear a ZIO patch monitor for 14 days.  This is a single patch monitor. [employer] supplies one patch monitor per enrollment. Additional  stickers are not available.  Please do not apply patch if you will be having a Nuclear Stress Test, Echocardiogram, Cardiac CT, MRI,  or Chest Xray during the period you would be  wearing the monitor. The patch cannot be worn during  these tests. You cannot remove and re-apply the ZIO AT patch monitor.  Your ZIO patch monitor will be mailed 3 day USPS to your address on file. It may take 3-5 days to  receive your monitor after you have been enrolled.  Once you have received your monitor, please review the enclosed instructions. Your monitor has  already been registered assigning a specific monitor serial # to you.   Billing and Patient Assistance Program information  [place] has been supplied with any insurance information on record for billing. [employer] offers a sliding scale Patient Assistance Program for patients without insurance, or whose  insurance does not completely cover the cost of the ZIO patch monitor. You must apply for the  Patient Assistance Program to qualify for the discounted rate. To apply, call [employer] at [phone],  select option 4, select option 2 , ask to apply for the Patient Assistance Program, (you can request an  interpreter if needed). [employer] will ask your household income and how many people are in your  household. [employer] will quote your out-of-pocket cost based on this information. They will also be able  to set up a 12 month interest free payment plan if needed.  Applying the monitor   Shave hair from upper left chest.  Hold the abrader disc by orange tab. Rub the abrader in 40 strokes over left upper chest as indicated in  your monitor instructions.  Clean area with 4 enclosed alcohol pads.  Use all pads to ensure the area is cleaned thoroughly. Let  dry.  Apply patch as indicated in monitor instructions. Patch will be placed under collarbone on left side of  chest with arrow pointing upward.  Rub patch adhesive wings for 2 minutes. Remove the white label marked 1. Remove the white label  marked 2. Rub patch adhesive wings for 2 additional minutes.  While looking in a mirror, press and release button in center of patch. A  small green light will flash 3-4  times. This will be your only indicator that the monitor has been turned on.  Do not shower for the first 24 hours. You may shower after the first 24 hours.  Press the button if you feel a symptom. You will hear a small click. Record Date, Time and Symptom in  the Patient Log.   Starting the Gateway  In your kit there is a Audiological scientist box the size of a cellphone. This is Buyer, retail. It transmits all your  recorded data to [facility]. This box must always stay within 10 feet of you. Open the box and push the *  button. There will be a light that blinks orange and then green a few times. When the light stops  blinking, the Gateway is connected to the ZIO patch. Call [employer] at [phone] to confirm your monitor is transmitting.  Returning your monitor  Remove your patch and place it inside the Gateway. In the lower half of the Gateway there is a white  bag with prepaid postage on it. Place Gateway in bag and seal. Mail package back to [place] as soon as  possible. Your physician should have your final report approximately 7 days after you have mailed back  your monitor. Call [facility] Customer Care at [phone] if you have questions regarding your ZIO AT  patch monitor. Call them immediately if you see an orange light blinking on your monitor.  If your monitor falls off in less than 4 days, contact our Monitor department at [phone]. If your  monitor becomes loose or falls off after 4 days call [employer] at [phone] for suggestions on  securing your monitor

## 2023-08-27 ENCOUNTER — Encounter: Payer: Self-pay | Admitting: Cardiology

## 2023-08-28 DIAGNOSIS — I10 Essential (primary) hypertension: Secondary | ICD-10-CM | POA: Diagnosis not present

## 2023-08-28 DIAGNOSIS — I2510 Atherosclerotic heart disease of native coronary artery without angina pectoris: Secondary | ICD-10-CM | POA: Diagnosis not present

## 2023-08-28 DIAGNOSIS — E782 Mixed hyperlipidemia: Secondary | ICD-10-CM | POA: Diagnosis not present

## 2023-08-28 DIAGNOSIS — R001 Bradycardia, unspecified: Secondary | ICD-10-CM | POA: Diagnosis not present

## 2023-08-28 DIAGNOSIS — I5032 Chronic diastolic (congestive) heart failure: Secondary | ICD-10-CM | POA: Diagnosis not present

## 2023-08-29 ENCOUNTER — Ambulatory Visit: Payer: Self-pay | Admitting: Cardiology

## 2023-08-29 DIAGNOSIS — R001 Bradycardia, unspecified: Secondary | ICD-10-CM

## 2023-08-29 DIAGNOSIS — R5382 Chronic fatigue, unspecified: Secondary | ICD-10-CM

## 2023-08-29 LAB — CBC
Hematocrit: 41.3 % (ref 34.0–46.6)
Hemoglobin: 13 g/dL (ref 11.1–15.9)
MCH: 31.1 pg (ref 26.6–33.0)
MCHC: 31.5 g/dL (ref 31.5–35.7)
MCV: 99 fL — ABNORMAL HIGH (ref 79–97)
Platelets: 280 x10E3/uL (ref 150–450)
RBC: 4.18 x10E6/uL (ref 3.77–5.28)
RDW: 13.9 % (ref 11.7–15.4)
WBC: 7.9 x10E3/uL (ref 3.4–10.8)

## 2023-08-29 LAB — BASIC METABOLIC PANEL WITH GFR
BUN/Creatinine Ratio: 22 (ref 12–28)
BUN: 20 mg/dL (ref 8–27)
CO2: 20 mmol/L (ref 20–29)
Calcium: 9.5 mg/dL (ref 8.7–10.3)
Chloride: 103 mmol/L (ref 96–106)
Creatinine, Ser: 0.91 mg/dL (ref 0.57–1.00)
Glucose: 99 mg/dL (ref 70–99)
Potassium: 4.4 mmol/L (ref 3.5–5.2)
Sodium: 142 mmol/L (ref 134–144)
eGFR: 63 mL/min/1.73 (ref >59)

## 2023-08-29 LAB — TSH: TSH: 1.25 u[IU]/mL (ref 0.450–4.500)

## 2023-08-29 LAB — MAGNESIUM: Magnesium: 2 mg/dL (ref 1.6–2.3)

## 2023-09-01 NOTE — Telephone Encounter (Signed)
-----   Message from Katlyn D West sent at 08/29/2023  9:34 AM EDT ----- Please let [name] know that her CBC shows no evidence of anemia or infection. Her kidney function is normal and electrolytes are normal. Her thyroid  function is normal. Good results! Follow up as  planned.  ----- Message ----- From: Rebecka Memos Lab Results In Sent: 08/28/2023   8:40 PM EDT To: Katlyn D West, NP

## 2023-09-01 NOTE — Telephone Encounter (Signed)
 Left message to call back

## 2023-09-06 ENCOUNTER — Ambulatory Visit (INDEPENDENT_AMBULATORY_CARE_PROVIDER_SITE_OTHER): Admitting: Family Medicine

## 2023-09-06 ENCOUNTER — Encounter: Payer: Self-pay | Admitting: Family Medicine

## 2023-09-06 VITALS — BP 115/68 | HR 94 | Temp 98.0°F | Resp 16 | Ht <= 58 in | Wt 226.2 lb

## 2023-09-06 DIAGNOSIS — R4781 Slurred speech: Secondary | ICD-10-CM

## 2023-09-06 DIAGNOSIS — R5383 Other fatigue: Secondary | ICD-10-CM

## 2023-09-06 NOTE — Telephone Encounter (Signed)
 Called patient advised of below they verbalized understanding.

## 2023-09-06 NOTE — Telephone Encounter (Signed)
-----   Message from Amanda Copeland sent at 08/29/2023  9:34 AM EDT ----- Please let [name] know that her CBC shows no evidence of anemia or infection. Her kidney function is normal and electrolytes are normal. Her thyroid  function is normal. Good results! Follow up as  planned.  ----- Message ----- From: Rebecka Memos Lab Results In Sent: 08/28/2023   8:40 PM EDT To: Amanda D West, NP

## 2023-09-06 NOTE — Progress Notes (Signed)
 Chief Complaint  Patient presents with   Insomnia    Insomnia     Subjective: Patient is a 83 y.o. female here for difficulty with fatigue.  She is here with her spouse who helps with the history.  Over the past 3 weeks, the patient has been waking up feeling good.  About an hour later she will start being very sleepy and devoid of energy.  This lasts without relief until around 3 or 4 in the afternoon.  No obvious trigger or relieving factors.  She is eating and drinking normally.  She gets around 8 to 9 hours of sleep nightly.  She snores a little bit, no apneic episodes.  Mood is stable.  No recent medication changes other than decreasing propranolol  which has not affected her symptoms.  She has some associated slurred speech when she does get groggy.  No other areas of weakness or balance issues.  Past Medical History:  Diagnosis Date   Arthritis    Asthma    childhood now returning   Back pain affecting pregnancy 11/02/2014   Benign essential tremor 10/28/2014   Bilateral dry eyes    Breast cancer (HCC)    right   Cancer (HCC)    Breast   Dry eyes    Encounter for Medicare annual wellness exam 05/22/2016   Essential hypertension, benign 10/28/2014   H/O measles    H/O mumps    History of chicken pox    Lumbago 11/02/2014   Mixed hyperlipidemia 10/28/2014   Obesity 10/28/2014   Stenosis of cervical spine     Objective: BP 115/68 (BP Location: Left Arm, Patient Position: Sitting)   Pulse 94   Temp 98 F (36.7 C) (Oral)   Resp 16   Ht 4' 9 (1.448 m)   Wt 226 lb 3.2 oz (102.6 kg)   SpO2 96%   BMI 48.95 kg/m  General: Awake, appears stated age Heart: RRR, no LE edema Lungs: CTAB, no rales, wheezes or rhonchi. No accessory muscle use Neuro: Speech is fluent and goal oriented Mouth: MMD Psych: Age appropriate judgment and insight, normal affect and mood  Assessment and Plan: Fatigue, unspecified type - Plan: Ambulatory referral to Neurology, Urine Culture, Urine  Microscopic Only  Slurred speech - Plan: MR Brain Wo Contrast  Refer to neurology/sleep for their opinion.  Reviewed labs from late July, which were done within the timeframe her symptoms, and they are normal.  Reviewed medications.  Check urine to rule out underlying infection.  Stay hydrated.  I would like her to check her blood pressure in the morning in addition to the afternoon when she is not having symptoms.  If there is a significant difference, we may reduce her amlodipine . Doubt she is actively having a stroke as she currently has no symptoms.  Will check an MRI brain to rule out any signs of infarction. The patient voiced understanding and agreement to the plan.  I spent 31 minutes with the patient discussing the above plans in addition to reviewing her chart on the same day of the visit.  Mabel [place], DO 09/06/23  1:30 PM

## 2023-09-06 NOTE — Patient Instructions (Addendum)
 Give us  2-3 business days to get the results of your labs back.   If you do not hear anything about your referral in the next 1-2 weeks, call our office and ask for an update.  Monitor your blood pressure when you have your symptoms and also when you do not. Let me know if there is a big change.   Someone will reach out regarding your scan.   Stay hydrated.  Let us  know if you need anything.

## 2023-09-07 ENCOUNTER — Ambulatory Visit: Payer: Self-pay | Admitting: Family Medicine

## 2023-09-07 LAB — URINALYSIS, MICROSCOPIC ONLY

## 2023-09-07 LAB — URINE CULTURE
MICRO NUMBER:: 16764824
SPECIMEN QUALITY:: ADEQUATE

## 2023-09-08 ENCOUNTER — Other Ambulatory Visit: Payer: Self-pay

## 2023-09-08 ENCOUNTER — Other Ambulatory Visit

## 2023-09-08 ENCOUNTER — Other Ambulatory Visit: Payer: Self-pay | Admitting: Family Medicine

## 2023-09-08 DIAGNOSIS — R5383 Other fatigue: Secondary | ICD-10-CM

## 2023-09-09 LAB — URINE CULTURE
MICRO NUMBER:: 16776120
Result:: NO GROWTH
SPECIMEN QUALITY:: ADEQUATE

## 2023-09-10 ENCOUNTER — Ambulatory Visit (HOSPITAL_BASED_OUTPATIENT_CLINIC_OR_DEPARTMENT_OTHER)

## 2023-09-11 ENCOUNTER — Ambulatory Visit: Payer: Self-pay | Admitting: Family Medicine

## 2023-09-13 ENCOUNTER — Ambulatory Visit (HOSPITAL_BASED_OUTPATIENT_CLINIC_OR_DEPARTMENT_OTHER)
Admission: RE | Admit: 2023-09-13 | Discharge: 2023-09-13 | Disposition: A | Discharge status: Home / Self Care | Source: Ambulatory Visit | Attending: Family Medicine | Admitting: Family Medicine

## 2023-09-13 DIAGNOSIS — R4781 Slurred speech: Secondary | ICD-10-CM | POA: Diagnosis not present

## 2023-09-14 DIAGNOSIS — R5383 Other fatigue: Secondary | ICD-10-CM | POA: Diagnosis not present

## 2023-09-14 DIAGNOSIS — T481X5D Adverse effect of skeletal muscle relaxants [neuromuscular blocking agents], subsequent encounter: Secondary | ICD-10-CM | POA: Diagnosis not present

## 2023-09-14 DIAGNOSIS — J309 Allergic rhinitis, unspecified: Secondary | ICD-10-CM | POA: Diagnosis not present

## 2023-09-14 DIAGNOSIS — J4530 Mild persistent asthma, uncomplicated: Secondary | ICD-10-CM | POA: Diagnosis not present

## 2023-09-20 ENCOUNTER — Ambulatory Visit: Admitting: Family Medicine

## 2023-09-25 DIAGNOSIS — R001 Bradycardia, unspecified: Secondary | ICD-10-CM

## 2023-09-30 ENCOUNTER — Other Ambulatory Visit: Payer: Self-pay | Admitting: Family Medicine

## 2023-09-30 DIAGNOSIS — I5032 Chronic diastolic (congestive) heart failure: Secondary | ICD-10-CM

## 2023-10-04 ENCOUNTER — Other Ambulatory Visit: Payer: Self-pay | Admitting: Family

## 2023-10-13 NOTE — Addendum Note (Signed)
 Addended by: Ardice Boyan W on: 10/13/2023 01:01 PM   Modules accepted: Orders

## 2023-10-17 ENCOUNTER — Ambulatory Visit (HOSPITAL_COMMUNITY)
Admission: RE | Admit: 2023-10-17 | Discharge: 2023-10-17 | Disposition: A | Discharge status: Home / Self Care | Source: Ambulatory Visit | Attending: Internal Medicine | Admitting: Internal Medicine

## 2023-10-17 DIAGNOSIS — R001 Bradycardia, unspecified: Secondary | ICD-10-CM | POA: Insufficient documentation

## 2023-10-17 DIAGNOSIS — I5030 Unspecified diastolic (congestive) heart failure: Secondary | ICD-10-CM

## 2023-10-17 DIAGNOSIS — R5382 Chronic fatigue, unspecified: Secondary | ICD-10-CM | POA: Insufficient documentation

## 2023-10-18 ENCOUNTER — Institutional Professional Consult (permissible substitution): Admitting: Neurology

## 2023-10-18 LAB — ECHOCARDIOGRAM COMPLETE
Area-P 1/2: 3.73 cm2
S' Lateral: 2.3 cm

## 2023-10-19 ENCOUNTER — Ambulatory Visit: Attending: Cardiology | Admitting: Cardiology

## 2023-10-19 ENCOUNTER — Encounter: Payer: Self-pay | Admitting: Cardiology

## 2023-10-19 ENCOUNTER — Ambulatory Visit: Payer: Self-pay | Admitting: Cardiology

## 2023-10-19 VITALS — BP 132/84 | HR 58 | Ht <= 58 in | Wt 230.0 lb

## 2023-10-19 DIAGNOSIS — I2510 Atherosclerotic heart disease of native coronary artery without angina pectoris: Secondary | ICD-10-CM | POA: Diagnosis not present

## 2023-10-19 DIAGNOSIS — I5032 Chronic diastolic (congestive) heart failure: Secondary | ICD-10-CM | POA: Diagnosis not present

## 2023-10-19 DIAGNOSIS — I10 Essential (primary) hypertension: Secondary | ICD-10-CM | POA: Insufficient documentation

## 2023-10-19 DIAGNOSIS — R001 Bradycardia, unspecified: Secondary | ICD-10-CM | POA: Diagnosis not present

## 2023-10-19 DIAGNOSIS — R5382 Chronic fatigue, unspecified: Secondary | ICD-10-CM | POA: Insufficient documentation

## 2023-10-19 NOTE — Patient Instructions (Signed)
 Medication Instructions:  Your physician recommends that you continue on your current medications as directed. Please refer to the Current Medication list given to you today.  *If you need a refill on your cardiac medications before your next appointment, please call your pharmacy*  Lab Work: NONE If you have labs (blood work) drawn today and your tests are completely normal, you will receive your results only by: MyChart Message (if you have MyChart) OR A paper copy in the mail If you have any lab test that is abnormal or we need to change your treatment, we will call you to review the results.  Testing/Procedures: NONE  Follow-Up: At [facility], you and your health needs are our priority.  As part of our continuing mission to provide you with exceptional heart care, our providers are all part of one team.  This team includes your primary Cardiologist (physician) and Advanced Practice Providers or APPs (Physician Assistants and Nurse Practitioners) who all work together to provide you with the care you need, when you need it.  Your next appointment:   MARCH 2026  Provider:   Redell Shallow, MD   We recommend signing up for the patient portal called MyChart.  Sign up information is provided on this After Visit Summary.  MyChart is used to connect with patients for Virtual Visits (Telemedicine).  Patients are able to view lab/test results, encounter notes, upcoming appointments, etc.  Non-urgent messages can be sent to your provider as well.   To learn more about what you can do with MyChart, go to [email or web address].

## 2023-10-19 NOTE — Progress Notes (Unsigned)
 Cardiology Office Note    Date:  10/19/2023  ID:  Amanda Copeland, DOB October 19, 1940, MRN [id number] PCP:  Frann Mabel Mt, DO  Cardiologist:  Redell Shallow, MD  Electrophysiologist:  None   Chief Complaint: Follow up for bradycardia and fatigue   History of Present Illness: .    Amanda Copeland is a 83 y.o. female with visit-pertinent history of CAD, hyperlipidemia, chronic diastolic heart failure, hypertension.  Patient was previously followed by Dr. Dann.  Carotid Dopplers in May 2019 showed less than 50% bilaterally.  Coronary CTA in May 2021 showed minimal nonobstructive coronary disease, calcium score 26 which was 30th percentile.  Echo in May 2021 showed normal LV function, grade 1 diastolic dysfunction.  CPX in October 2021 consistent with submaximal effort, mildly reduced functional capacity compared to sedentary controls, ventilatory limitation related to her body habitus but no clear cardiopulmonary limitation and possible chronotropic incompetence.  Patient was seen by Dr. Shallow on 04/25/2023 to establish care.  Patient reported dyspnea that was unchanged, denied any orthopnea, PND, pedal edema, chest pain or syncope.  Patient was last in clinic on 08/25/2023 with reports of increased fatigue and low heart rates.  She reports that she been monitoring her heart rate at home and over the prior 2 weeks had noticed that her heart rate had been consistently in the 40s, reported significant fatigue and that she was regularly falling asleep throughout the day.  It was noted the patient was on propranolol  40 mg twice a day and had been on the medication for significant mount time and has resolved her Parkinson's to treat essential tremors.  Patient propranolol  was decreased to 20 mg twice daily, echocardiogram and cardiac monitor was ordered.  Cardiac monitor showed an average heart rate of 54 bpm ranging from 37 to 146 bpm.  Predominant underlying rhythm was sinus rhythm, first-degree AV  block was present, she had 1 run of ventricular tachycardia lasting 5 beats with a max rate of 146 bpm, 3 episodes of supraventricular tachycardia, run with the fastest interval lasting 7 beats with a max rate of 107 bpm, longest lasting 7 beats with an average rate of 93 bpm.  No high-grade blocks are present.  Echocardiogram on 10/17/2023 indicated LVEF 65 to 70%, no regional wall motion abnormalities, G1 DD, RV systolic function and size was normal, normal PASP, trivial mitral valve regurgitation with no evidence of stenosis, aortic valve regurgitation is not visualized, aortic stenosis is not present.  Today she presents for follow-up.  She reports that she has been doing significantly better.  She reports that her fatigue has resolved and her heart rate has improved.  She notes that after seeing her PCP and having an MRI she realized that she was taking a muscle relaxer daily resulting in significant fatigue, brain fog and drowsiness.  Following discontinuation of the medication her symptoms have resolved.  She does report that with the reduced dose of propranolol  she has slightly increased tremors however her heart rate has improved, she continues to monitor on her Apple watch.  She denies any chest pain, shortness of breath, lower extremity edema, orthopnea or PND.  She denies any palpitations, presyncope or syncope. ROS: .   Today she denies chest pain, shortness of breath, lower extremity edema, fatigue, palpitations, melena, hematuria, hemoptysis, diaphoresis, weakness, presyncope, syncope, orthopnea, and PND.  All other systems are reviewed and otherwise negative. Studies Reviewed: SABRA   EKG:  EKG is ordered today, personally reviewed, demonstrating  EKG Interpretation Date/Time:  Thursday October 19 2023 13:27:16 EDT Ventricular Rate:  58 PR Interval:  218 QRS Duration:  100 QT Interval:  434 QTC Calculation: 426 R Axis:   60  Text Interpretation: Sinus bradycardia with 1st degree A-V  block Cannot rule out Anterior infarct (cited on or before 25-Aug-2023) When compared with ECG of 25-Aug-2023 14:29, No significant change was found Confirmed by Akila Batta [phone]) on 10/19/2023 1:30:08 PM   CV Studies: Cardiac studies reviewed are outlined and summarized above. Otherwise please see EMR for full report. Cardiac Studies & Procedures   ______________________________________________________________________________________________     ECHOCARDIOGRAM  ECHOCARDIOGRAM COMPLETE 10/17/2023  Narrative ECHOCARDIOGRAM REPORT    Patient Name:   Amanda Copeland  Date of Exam: 10/17/2023 Medical Rec #:  [id number]     Height:       57.0 in Accession #:    [id number]    Weight:       226.2 lb Date of Birth:  1940/10/04     BSA:          1.895 m Patient Age:    82 years      BP:           115/68 mmHg Patient Gender: F             HR:           94 bpm. Exam Location:  [address]  Procedure: 2D Echo, 3D Echo, Cardiac Doppler and Color Doppler (Both Spectral and Color Flow Doppler were utilized during procedure).  Indications:    R00.1 Bradycardia R53.82 Fatigue  History:        Patient has prior history of Echocardiogram examinations, most recent 06/25/2019. Arrythmias:Bradycardia, Signs/Symptoms:Fatigue and Dizziness/Lightheadedness; Risk Factors:Family History of Coronary Artery Disease and Dyslipidemia. History of Right Breast Cancer status post Right Mastectomy with Chemotherapy and Radiation, History of Measles and Mumps, History of Left Breast Reduction.  Sonographer:    Heather Hawks RDCS Referring Phys: Malyn Aytes D Shayleigh Bouldin  IMPRESSIONS   1. Left ventricular ejection fraction, by estimation, is 65 to 70%. Left ventricular ejection fraction by 3D volume is 69 %. The left ventricle has normal function. The left ventricle has no regional wall motion abnormalities. Left ventricular diastolic parameters are consistent with Grade I diastolic dysfunction (impaired relaxation). 2.  Right ventricular systolic function is normal. The right ventricular size is normal. There is normal pulmonary artery systolic pressure. 3. Left atrial size was moderately dilated. 4. The mitral valve is normal in structure. Trivial mitral valve regurgitation. No evidence of mitral stenosis. 5. The aortic valve is tricuspid. Aortic valve regurgitation is not visualized. No aortic stenosis is present. 6. The inferior vena cava is normal in size with greater than 50% respiratory variability, suggesting right atrial pressure of 3 mmHg.  FINDINGS Left Ventricle: Left ventricular ejection fraction, by estimation, is 65 to 70%. Left ventricular ejection fraction by 3D volume is 69 %. The left ventricle has normal function. The left ventricle has no regional wall motion abnormalities. The left ventricular internal cavity size was normal in size. There is no left ventricular hypertrophy. Left ventricular diastolic parameters are consistent with Grade I diastolic dysfunction (impaired relaxation). Indeterminate filling pressures.  Right Ventricle: The right ventricular size is normal. No increase in right ventricular wall thickness. Right ventricular systolic function is normal. There is normal pulmonary artery systolic pressure. The tricuspid regurgitant velocity is 2.50 m/s, and with an assumed right atrial pressure of 3 mmHg, the estimated right ventricular systolic pressure is 28.0  mmHg.  Left Atrium: Left atrial size was moderately dilated.  Right Atrium: Right atrial size was normal in size.  Pericardium: There is no evidence of pericardial effusion.  Mitral Valve: The mitral valve is normal in structure. Trivial mitral valve regurgitation. No evidence of mitral valve stenosis.  Tricuspid Valve: The tricuspid valve is normal in structure. Tricuspid valve regurgitation is trivial. No evidence of tricuspid stenosis.  Aortic Valve: The aortic valve is tricuspid. Aortic valve regurgitation is not  visualized. No aortic stenosis is present.  Pulmonic Valve: The pulmonic valve was normal in structure. Pulmonic valve regurgitation is mild. No evidence of pulmonic stenosis.  Aorta: The aortic root is normal in size and structure.  Venous: The inferior vena cava is normal in size with greater than 50% respiratory variability, suggesting right atrial pressure of 3 mmHg.  IAS/Shunts: No atrial level shunt detected by color flow Doppler.  Additional Comments: 3D was performed not requiring image post processing on an independent workstation and was normal.   LEFT VENTRICLE PLAX 2D LVIDd:         4.20 cm         Diastology LVIDs:         2.30 cm         LV e' medial:    5.77 cm/s LV PW:         1.00 cm         LV E/e' medial:  12.2 LV IVS:        0.80 cm         LV e' lateral:   5.44 cm/s LVOT diam:     2.20 cm         LV E/e' lateral: 12.9 LV SV:         83 LV SV Index:   44 LVOT Area:     3.80 cm        3D Volume EF LV 3D EF:    Left ventricul ar ejection fraction by 3D volume is 69 %.  3D Volume EF: 3D EF:        69 % LV EDV:       122 ml LV ESV:       38 ml LV SV:        84 ml  RIGHT VENTRICLE RV Basal diam:  3.60 cm RV S prime:     14.30 cm/s TAPSE (M-mode): 2.2 cm RVSP:           28.0 mmHg  LEFT ATRIUM             Index        RIGHT ATRIUM           Index LA diam:        4.00 cm 2.11 cm/m   RA Pressure: 3.00 mmHg LA Vol (A2C):   39.0 ml 20.58 ml/m  RA Area:     16.50 cm LA Vol (A4C):   70.7 ml 37.30 ml/m  RA Volume:   43.00 ml  22.69 ml/m LA Biplane Vol: 55.0 ml 29.02 ml/m AORTIC VALVE LVOT Vmax:   93.25 cm/s LVOT Vmean:  61.550 cm/s LVOT VTI:    0.220 m  AORTA Ao Root diam: 2.90 cm Ao Asc diam:  3.50 cm  MITRAL VALVE               TRICUSPID VALVE MV Area (PHT): cm         TR Peak grad:   25.0 mmHg MV  Decel Time: 204 msec    TR Vmax:        250.00 cm/s MV E velocity: 70.12 cm/s  Estimated RAP:  3.00 mmHg MV A velocity: 91.88 cm/s  RVSP:            28.0 mmHg MV E/A ratio:  0.76 SHUNTS Systemic VTI:  0.22 m Systemic Diam: 2.20 cm  Annabella Scarce MD Electronically signed by Annabella Scarce MD Signature Date/Time: 10/18/2023/2:05:26 PM    Final    MONITORS  LONG TERM MONITOR (3-14 DAYS) 09/25/2023  Narrative Patch Wear Time:  13 days and 23 hours (2025-07-22T10:09:21-0400 to 2025-08-05T10:09:13-0400)  Patient had a min HR of 37 bpm, max HR of 146 bpm, and avg HR of 54 bpm. Predominant underlying rhythm was Sinus Rhythm. First Degree AV Block was present. 1 run of Ventricular Tachycardia occurred lasting 5 beats with a max rate of 146 bpm (avg 108 bpm). 3 Supraventricular Tachycardia runs occurred, the run with the fastest interval lasting 7 beats with a max rate of 107 bpm, the longest lasting 7 beats with an avg rate of 93 bpm. Some episodes of Supraventricular Tachycardia may be possible Atrial Tachycardia with variable block. Isolated SVEs were rare (<1.0%), SVE Couplets were rare (<1.0%), and SVE Triplets were rare (<1.0%). Isolated VEs were rare (<1.0%), VE Couplets were rare (<1.0%), and no VE Triplets were present.  Sinus bradycardia, normal sinus rhythm, occasional PAC, 7 beats SVT, occasional PVC, 5 beats nonsustained ventricular tachycardia. Redell Shallow   CT SCANS  CT CORONARY MORPH W/CTA COR W/SCORE 03/14/2019  Addendum 03/14/2019  9:51 PM ADDENDUM REPORT: 03/14/2019 21:47  CLINICAL DATA:  83 year old female with HTN, HLP and chest pain.  EXAM: Cardiac/Coronary  CTA  TECHNIQUE: The patient was scanned on a [employer].  FINDINGS: A 100 kV prospective scan was triggered in the descending thoracic aorta at 111 HU's. Axial non-contrast 3 mm slices were carried out through the heart. The data set was analyzed on a dedicated work station and scored using the Agatson method. Gantry rotation speed was 250 msecs and collimation was .6 mm. 20 mg of PO Propranolol  and 0.8 mg of sl NTG was given. The  3D data set was reconstructed in 5% intervals of the 67-82 % of the R-R cycle. Diastolic phases were analyzed on a dedicated work station using MPR, MIP and VRT modes. The patient received 80 cc of contrast.  Aorta: Normal size. Minimal diffuse calcifications. No dissection.  Aortic Valve:  Trileaflet.  Trivial calcifications.  Coronary Arteries:  Normal coronary origin.  Right dominance.  RCA is a large dominant artery that gives rise to PDA and PLA. There are minimal irregularities.  Left main is a large artery that gives rise to LAD and LCX arteries. There is minimal calcified plaque with stenosis 0-25%.  LAD is a large vessel that gives rise to two small diagonal arteries and has only minimal irregularities.  LCX is a non-dominant artery that gives rise to one large OM1 branch. There is no plaque.  Other findings:  Normal pulmonary vein drainage into the left atrium.  Normal left atrial appendage without a thrombus.  Normal size of the pulmonary artery.  IMPRESSION: 1. Coronary calcium score of 26. This was 30 percentile for age and sex matched control.  2. Normal coronary origin with right dominance.  3. CAD-RADS 1. Minimal non-obstructive CAD (0-24%). Consider non-atherosclerotic causes of chest pain. Consider preventive therapy and risk factor modification.   Electronically Signed By: Leim  Maranda On: 03/14/2019 21:47  Narrative EXAM: OVER-READ INTERPRETATION  CT CHEST  The following report is an over-read performed by radiologist Dr. Jackquline Boxer of [facility] on 03/14/2019. This over-read does not include interpretation of cardiac or coronary anatomy or pathology. The coronary CTA interpretation by the cardiologist is attached.  COMPARISON:  None.  FINDINGS: Limited view of the lung parenchyma demonstrates no suspicious nodularity. Airways are normal.  Limited view of the mediastinum demonstrates no adenopathy. Esophagus  normal.  Limited view of the upper abdomen unremarkable.  Limited view of the skeleton and chest wall is unremarkable. Degenerative osteophytosis of the spine.  IMPRESSION: No significant extracardiac findings.  Electronically Signed: By: Jackquline Boxer M.D. On: 03/14/2019 10:31     ______________________________________________________________________________________________       Current Reported Medications:.    Current Meds  Medication Sig   allopurinol  (ZYLOPRIM ) 100 MG tablet TAKE 2 TABLETS BY MOUTH EVERY DAY   AMBULATORY NON FORMULARY MEDICATION Rollator  Dx: Gait instability   amLODipine  (NORVASC ) 5 MG tablet Take 1 tablet (5 mg total) by mouth daily.   aspirin  81 MG tablet Take 81 mg by mouth daily.   colchicine  0.6 MG tablet Take 2 tabs at first sign of a flare and repeat 1 tab 1 hr later. Take 1 tab twice daily until flare resolves. (Patient taking differently: Take 0.6 mg by mouth daily as needed (for gout flareup). Take 2 tabs at first sign of a flare and repeat 1 tab 1 hr later. Take 1 tab twice daily until flare resolves.)   diclofenac  Sodium (PENNSAID ) 2 % SOLN Apply 2 Pump (40 mg total) topically 2 (two) times daily as needed (Pain).   Evolocumab  (REPATHA  SURECLICK) 140 MG/ML SOAJ Inject 140 mg into the skin every 14 (fourteen) days.   ezetimibe  (ZETIA ) 10 MG tablet TAKE 1 TABLET BY MOUTH EVERY DAY   furosemide  (LASIX ) 40 MG tablet Take 1.5 tablets (60 mg total) by mouth daily.   levothyroxine  (SYNTHROID ) 112 MCG tablet TAKE 1 TABLET BY MOUTH EVERY DAY BEFORE BREAKFAST   Multiple Vitamins-Minerals (ICAPS AREDS 2) CAPS Take 1 capsule by mouth 2 (two) times daily.   potassium chloride  (KLOR-CON ) 10 MEQ tablet TAKE 1 TABLET WITH THE 20 MEQ TABLET FOR A TOTAL OF 30 MEQ WITH THE 60 MG OF LASIX .   propranolol  (INDERAL ) 20 MG tablet Take 1 tablet (20 mg total) by mouth 2 (two) times daily.    Physical Exam:   VS:  BP 132/84   Pulse (!) 58   Ht 4' 9 (1.448 m)    Wt 230 lb (104.3 kg)   SpO2 95%   BMI 49.77 kg/m    Wt Readings from Last 3 Encounters:  10/19/23 230 lb (104.3 kg)  09/06/23 226 lb 3.2 oz (102.6 kg)  08/25/23 226 lb (102.5 kg)    GEN: Well nourished, well developed in no acute distress NECK: No JVD; No carotid bruits CARDIAC: RRR, no murmurs, rubs, gallops RESPIRATORY:  Clear to auscultation without rales, wheezing or rhonchi  ABDOMEN: Soft, non-tender, non-distended EXTREMITIES:  No edema; No acute deformity     Asessement and Plan:.    Bradycardia/fatigue: Patient reported at last office visit significant fatigue and bradycardia with heart rates in the low 40s at home.  Patient's propranolol  was decreased to 20 mg twice daily, cardiac monitor reassuring as noted above.  Today patient reports that she has had significant improvement in her fatigue, reports that she was accidentally taking a muscle  relaxer medication resulting in significant fatigue, drowsiness.  She has since discontinued medication with significant improvement, continues on propranolol  20 mg twice daily and has noted significant proved meant in her heart rates.  Continue propranolol  20 mg twice daily.  Reviewed ED precautions.  CAD: Mild on CTA in 2021. Stable with no anginal symptoms. No indication for ischemic evaluation.  Heart healthy diet and regular cardiovascular exercise encouraged.  Continue aspirin    Chronic diastolic congestive heart failure: Echocardiogram on 10/17/2023 indicated LVEF 65 to 70%, no regional wall motion abnormalities, G1 DD, RV systolic function and size was normal, normal PASP, trivial mitral valve regurgitation with no evidence of stenosis, aortic valve regurgitation is not visualized, aortic stenosis is not present. Patient is currently euvolemic and well compensated on exam.   Hypertension: Blood pressure today 132/84. Continue current antihypertensive regimen.  Hyperlipidemia: Patient with history of statin intolerance.  She is on Zetia   and Repatha .  Dyspnea: Previous CPX did not suggest cardiopulmonary limitation no obstructive coronary disease on CTA and LV function is normal.    Disposition: F/u with Dr. Pietro in 04/2024.   Signed, [place] Wank D Deone Omahoney, NP

## 2023-10-20 ENCOUNTER — Encounter: Payer: Self-pay | Admitting: Cardiology

## 2023-10-23 DIAGNOSIS — H353112 Nonexudative age-related macular degeneration, right eye, intermediate dry stage: Secondary | ICD-10-CM | POA: Diagnosis not present

## 2023-10-23 DIAGNOSIS — H43813 Vitreous degeneration, bilateral: Secondary | ICD-10-CM | POA: Diagnosis not present

## 2023-10-23 DIAGNOSIS — H35373 Puckering of macula, bilateral: Secondary | ICD-10-CM | POA: Diagnosis not present

## 2023-10-23 DIAGNOSIS — H35363 Drusen (degenerative) of macula, bilateral: Secondary | ICD-10-CM | POA: Diagnosis not present

## 2023-10-23 DIAGNOSIS — H353121 Nonexudative age-related macular degeneration, left eye, early dry stage: Secondary | ICD-10-CM | POA: Diagnosis not present

## 2023-10-23 DIAGNOSIS — H35721 Serous detachment of retinal pigment epithelium, right eye: Secondary | ICD-10-CM | POA: Diagnosis not present

## 2023-10-23 DIAGNOSIS — H35033 Hypertensive retinopathy, bilateral: Secondary | ICD-10-CM | POA: Diagnosis not present

## 2023-10-25 ENCOUNTER — Other Ambulatory Visit: Payer: Self-pay | Admitting: Family Medicine

## 2023-10-25 DIAGNOSIS — E782 Mixed hyperlipidemia: Secondary | ICD-10-CM

## 2023-11-10 ENCOUNTER — Other Ambulatory Visit: Payer: Self-pay | Admitting: Family Medicine

## 2023-11-17 ENCOUNTER — Other Ambulatory Visit (HOSPITAL_COMMUNITY)

## 2023-11-21 ENCOUNTER — Ambulatory Visit: Payer: Self-pay | Admitting: Family Medicine

## 2023-11-21 ENCOUNTER — Encounter: Payer: Self-pay | Admitting: Family Medicine

## 2023-11-21 ENCOUNTER — Ambulatory Visit: Admitting: Family Medicine

## 2023-11-21 VITALS — BP 130/78 | HR 63 | Temp 98.0°F | Resp 16 | Ht <= 58 in | Wt 230.0 lb

## 2023-11-21 DIAGNOSIS — M1A9XX0 Chronic gout, unspecified, without tophus (tophi): Secondary | ICD-10-CM | POA: Diagnosis not present

## 2023-11-21 DIAGNOSIS — E039 Hypothyroidism, unspecified: Secondary | ICD-10-CM | POA: Diagnosis not present

## 2023-11-21 DIAGNOSIS — I5032 Chronic diastolic (congestive) heart failure: Secondary | ICD-10-CM | POA: Diagnosis not present

## 2023-11-21 LAB — BASIC METABOLIC PANEL WITH GFR
BUN: 16 mg/dL (ref 6–23)
CO2: 25 meq/L (ref 19–32)
Calcium: 9.6 mg/dL (ref 8.4–10.5)
Chloride: 107 meq/L (ref 96–112)
Creatinine, Ser: 0.83 mg/dL (ref 0.40–1.20)
GFR: 64.96 mL/min (ref >60.00)
Glucose, Bld: 118 mg/dL — ABNORMAL HIGH (ref 70–99)
Potassium: 4.5 meq/L (ref 3.5–5.1)
Sodium: 145 meq/L (ref 135–145)

## 2023-11-21 LAB — T4, FREE: Free T4: 1.24 ng/dL (ref 0.60–1.60)

## 2023-11-21 LAB — URIC ACID: Uric Acid, Serum: 3.7 mg/dL (ref 2.4–7.0)

## 2023-11-21 LAB — TSH: TSH: 2.45 u[IU]/mL (ref 0.35–5.50)

## 2023-11-21 MED ORDER — POTASSIUM CHLORIDE ER 10 MEQ PO TBCR
EXTENDED_RELEASE_TABLET | ORAL | 3 refills | Status: AC
Start: 2023-11-21 — End: ?

## 2023-11-21 NOTE — Patient Instructions (Signed)
 Give us  2-3 business days to get the results of your labs back.   Keep the diet clean and stay active.  Let us  know if you need anything.

## 2023-11-21 NOTE — Progress Notes (Signed)
 Chief Complaint  Patient presents with   Transitions Of Care    Transition of Care    Amanda Copeland is a 83 y.o. female here for gout.  Currently being treated with allopurinol  200 mg daily, colchicine  as needed. No recent flares. Most recent uric acid level is: 3.5 Reports compliance. Side effects of medications: None Is avoiding seafood, sweet/sugary beverages, alcohol, and red meats.  Hypothyroidism Patient presents for follow-up of hypothyroidism.  Reports compliance with medication - levothyroxine  112 mcg/d. Current symptoms include: denies fatigue, weight changes, heat/cold intolerance, bowel/skin changes or CVS symptoms She believes her dose should be not significantly changed  Past Medical History:  Diagnosis Date   Arthritis    Asthma    childhood now returning   Back pain affecting pregnancy 11/02/2014   Benign essential tremor 10/28/2014   Bilateral dry eyes    Breast cancer (HCC)    right   Cancer (HCC)    Breast   Dry eyes    Encounter for Medicare annual wellness exam 05/22/2016   Essential hypertension, benign 10/28/2014   H/O measles    H/O mumps    History of chicken pox    Lumbago 11/02/2014   Mixed hyperlipidemia 10/28/2014   Obesity 10/28/2014   Stenosis of cervical spine     BP 130/78 (BP Location: Left Arm, Patient Position: Sitting)   Pulse 63   Temp 98 F (36.7 C) (Oral)   Resp 16   Ht 4' 9 (1.448 m)   Wt 230 lb (104.3 kg)   SpO2 96%   BMI 49.77 kg/m  Gen: Awake, alert, appears stated age Neck: No gross deformity Heart: RRR Lungs: CTAB, no accessory muscle use Psych: Age appropriate judgment and insight, nml mood and affect  Hypothyroidism, unspecified type - Plan: T4, free, TSH  Chronic gout involving toe of right foot without tophus, unspecified cause - Plan: Uric acid  Chronic diastolic heart failure (HCC) - Plan: potassium chloride  (KLOR-CON ) 10 MEQ tablet, Basic metabolic panel with GFR  Chronic, stable. May be able to wean down  to 100 mg daily of the allopurinol  but will continue to 100 mg daily for now and colchicine  as needed. Chronic, stable.  Continue levothyroxine  112 mcg daily. Follow-up in 6 months or as needed. The patient voiced understanding and agreement to the plan.  Mabel Mt [place], DO 11/21/23 10:31 AM

## 2023-11-27 ENCOUNTER — Telehealth: Payer: Self-pay | Admitting: *Deleted

## 2023-11-27 NOTE — Telephone Encounter (Signed)
 Left message for pt to call back regarding her next prolia  injection

## 2023-11-30 ENCOUNTER — Other Ambulatory Visit: Payer: Self-pay | Admitting: Family Medicine

## 2023-11-30 ENCOUNTER — Other Ambulatory Visit: Payer: Self-pay | Admitting: Pulmonary Disease

## 2023-11-30 DIAGNOSIS — I5032 Chronic diastolic (congestive) heart failure: Secondary | ICD-10-CM

## 2023-12-04 ENCOUNTER — Ambulatory Visit: Admitting: Family Medicine

## 2023-12-06 ENCOUNTER — Telehealth: Payer: Self-pay | Admitting: Pharmacy Technician

## 2023-12-06 ENCOUNTER — Other Ambulatory Visit (HOSPITAL_COMMUNITY): Payer: Self-pay

## 2023-12-06 NOTE — Telephone Encounter (Signed)
 Pharmacy Patient Advocate Encounter   Received notification from CoverMyMeds that prior authorization for repatha  is required/requested.   Insurance verification completed.   The patient is insured through [facility].   Per test claim: PA required; PA submitted to above mentioned insurance via Latent Key/confirmation #/[id number] Status is pending

## 2023-12-06 NOTE — Telephone Encounter (Signed)
 Pharmacy Patient Advocate Encounter  Received notification from [employer] that Prior Authorization for Repatha  has been APPROVED from 12/06/23 to 12/04/24. Ran test claim, Copay is $5.00- one month. This test claim was processed through [facility]- copay amounts may vary at other pharmacies due to pharmacy/plan contracts, or as the patient moves through the different stages of their insurance plan.   PA #/Case ID/Reference #: [id number]

## 2023-12-11 ENCOUNTER — Encounter: Payer: Self-pay | Admitting: Radiology

## 2023-12-12 NOTE — Progress Notes (Unsigned)
 Assessment/Plan:    1.  Essential Tremor  -*** propranolol  to 40 mg bid.   2.  History of gabapentin  induced myoclonus  -Off of gabapentin  3.  Follow-up 1 year. Subjective:   Amanda Copeland was seen today in follow up for essential tremor.  My previous records were reviewed prior to todays visit.  Patient is with her husband who supplements the history.  Last visit, I decreased her propranolol  because of bradycardia (asymptomatic at the time).  I did look through her review of flowsheets since our last visit, and pulses generally still remained in the high 50s/60s most of the time.  She had 1 recorded pulse in our offices in July of 48.  Interestingly, at that visit the patient was actually complaining about fatigue.  Her primary care physician sent her to [facility] neurology to evaluate her sleep, but they did not accept her as a patient because her insurance does not verify.  She did see the cardiology NP regarding the same and those notes are reviewed.  The cardiology NP decreased her propranolol  to 20 mg twice per day.  Current prescribed movement disorder medications: Propranolol , 40 mg bid   PREVIOUS MEDICATIONS:  propranolol  ; metoprolol  (didn't help); primidone  (made too sleepy and didn't help at 100 mg daily); topamax  (not helpful and SE); gabapentin  (myoclonus)  ALLERGIES:   Allergies  Allergen Reactions   Robaxin  [Methocarbamol ] Other (See Comments)    Altered mental status   Amoxicillin Rash and Dermatitis   Ampicillin Rash and Dermatitis   Penicillins Rash   Statins Other (See Comments)    Per reports muscle aches and joint pain Per reports muscle aches and joint pain    CURRENT MEDICATIONS:  Outpatient Encounter Medications as of 12/14/2023  Medication Sig   allopurinol  (ZYLOPRIM ) 100 MG tablet TAKE 2 TABLETS BY MOUTH EVERY DAY   AMBULATORY NON FORMULARY MEDICATION Rollator  Dx: Gait instability   amLODipine  (NORVASC ) 5 MG tablet Take 1 tablet (5 mg total) by  mouth daily.   aspirin  81 MG tablet Take 81 mg by mouth daily.   colchicine  0.6 MG tablet Take 2 tabs at first sign of a flare and repeat 1 tab 1 hr later. Take 1 tab twice daily until flare resolves. (Patient taking differently: Take 0.6 mg by mouth daily as needed (for gout flareup). Take 2 tabs at first sign of a flare and repeat 1 tab 1 hr later. Take 1 tab twice daily until flare resolves.)   diclofenac  Sodium (PENNSAID ) 2 % SOLN Apply 2 Pump (40 mg total) topically 2 (two) times daily as needed (Pain).   Evolocumab  (REPATHA  SURECLICK) 140 MG/ML SOAJ Inject 140 mg into the skin every 14 (fourteen) days.   ezetimibe  (ZETIA ) 10 MG tablet TAKE 1 TABLET BY MOUTH EVERY DAY   furosemide  (LASIX ) 40 MG tablet Take 1.5 tablets (60 mg total) by mouth daily.   levothyroxine  (SYNTHROID ) 112 MCG tablet TAKE 1 TABLET BY MOUTH EVERY DAY BEFORE BREAKFAST   Multiple Vitamins-Minerals (ICAPS AREDS 2) CAPS Take 1 capsule by mouth 2 (two) times daily.   potassium chloride  (KLOR-CON ) 10 MEQ tablet TAKE 3 TABLETS WITH THE 60 MG OF LASIX .   propranolol  (INDERAL ) 20 MG tablet Take 1 tablet (20 mg total) by mouth 2 (two) times daily.   No facility-administered encounter medications on file as of 12/14/2023.     Objective:    PHYSICAL EXAMINATION:    VITALS:   There were no vitals filed for this visit.  GEN:  The patient appears stated age and is in NAD. HEENT:  Normocephalic, atraumatic.  The mucous membranes are moist. The superficial temporal arteries are without ropiness or tenderness. Cardiovascular: Bradycardic.  Regular. Lungs: Clear to auscultation bilaterally  Neurological examination:  Orientation: The patient is alert and oriented x3. Cranial nerves: There is good facial symmetry. The speech is fluent and clear. Soft palate rises symmetrically and there is no tongue deviation. Hearing is intact to conversational tone. Sensation: Sensation is intact to light touch throughout Motor: Strength is  at least antigravity x4.  Movement examination: Tone: There is normal tone in the UE/LE Abnormal movements: There is no rest tremor.  There is no postural tremor (same as previous).  There is no intention tremor.   Coordination:  There is no decremation with RAM's Gait and Station: Not tested today. I have reviewed and interpreted the following labs independently   Chemistry      Component Value Date/Time   NA 145 11/21/2023 1030   NA 142 08/28/2023 1037   K 4.5 11/21/2023 1030   CL 107 11/21/2023 1030   CO2 25 11/21/2023 1030   BUN 16 11/21/2023 1030   BUN 20 08/28/2023 1037   CREATININE 0.83 11/21/2023 1030   CREATININE 0.98 (H) 06/14/2023 1317      Component Value Date/Time   CALCIUM 9.6 11/21/2023 1030   ALKPHOS 113 08/09/2023 0939   AST 15 08/09/2023 0939   ALT 9 08/09/2023 0939   BILITOT 0.3 08/09/2023 0939      Lab Results  Component Value Date   WBC 7.9 08/28/2023   HGB 13.0 08/28/2023   HCT 41.3 08/28/2023   MCV 99 (H) 08/28/2023   PLT 280 08/28/2023   Lab Results  Component Value Date   TSH 2.45 11/21/2023     Chemistry      Component Value Date/Time   NA 145 11/21/2023 1030   NA 142 08/28/2023 1037   K 4.5 11/21/2023 1030   CL 107 11/21/2023 1030   CO2 25 11/21/2023 1030   BUN 16 11/21/2023 1030   BUN 20 08/28/2023 1037   CREATININE 0.83 11/21/2023 1030   CREATININE 0.98 (H) 06/14/2023 1317      Component Value Date/Time   CALCIUM 9.6 11/21/2023 1030   ALKPHOS 113 08/09/2023 0939   AST 15 08/09/2023 0939   ALT 9 08/09/2023 0939   BILITOT 0.3 08/09/2023 0939     Total time spent on today's visit was *** minutes, including both face-to-face time and nonface-to-face time.  Time included that spent on review of records (prior notes available to me/labs/imaging if pertinent), discussing treatment and goals, answering patient's questions and coordinating care.    Cc:  Frann Mabel Mt, DO

## 2023-12-14 ENCOUNTER — Ambulatory Visit (INDEPENDENT_AMBULATORY_CARE_PROVIDER_SITE_OTHER): Payer: Self-pay | Admitting: Neurology

## 2023-12-14 VITALS — BP 116/72 | HR 56

## 2023-12-14 DIAGNOSIS — G250 Essential tremor: Secondary | ICD-10-CM

## 2023-12-14 NOTE — Patient Instructions (Signed)

## 2024-01-03 ENCOUNTER — Ambulatory Visit (HOSPITAL_BASED_OUTPATIENT_CLINIC_OR_DEPARTMENT_OTHER)
Admission: RE | Admit: 2024-01-03 | Discharge: 2024-01-03 | Disposition: A | Discharge status: Home / Self Care | Source: Ambulatory Visit | Attending: Family Medicine | Admitting: Family Medicine

## 2024-01-03 ENCOUNTER — Encounter: Payer: Self-pay | Admitting: Family Medicine

## 2024-01-03 ENCOUNTER — Ambulatory Visit: Admitting: Family Medicine

## 2024-01-03 VITALS — BP 120/78 | HR 61 | Temp 98.0°F | Resp 16 | Ht <= 58 in | Wt 230.0 lb

## 2024-01-03 DIAGNOSIS — M25551 Pain in right hip: Secondary | ICD-10-CM | POA: Diagnosis not present

## 2024-01-03 DIAGNOSIS — J029 Acute pharyngitis, unspecified: Secondary | ICD-10-CM

## 2024-01-03 DIAGNOSIS — M160 Bilateral primary osteoarthritis of hip: Secondary | ICD-10-CM | POA: Diagnosis not present

## 2024-01-03 DIAGNOSIS — M47816 Spondylosis without myelopathy or radiculopathy, lumbar region: Secondary | ICD-10-CM | POA: Diagnosis not present

## 2024-01-03 LAB — POCT RAPID STREP A (OFFICE): Rapid Strep A Screen: NEGATIVE

## 2024-01-03 LAB — POCT INFLUENZA A/B
Influenza A, POC: NEGATIVE
Influenza B, POC: NEGATIVE

## 2024-01-03 LAB — POC COVID19 BINAXNOW: SARS Coronavirus 2 Ag: NEGATIVE

## 2024-01-03 MED ORDER — DICLOFENAC SODIUM 75 MG PO TBEC: 75.0000 mg | DELAYED_RELEASE_TABLET | Freq: Two times a day (BID) | ORAL | 0 refills | Status: DC | PRN

## 2024-01-03 MED ORDER — AZITHROMYCIN 500 MG PO TABS: ORAL_TABLET | ORAL | 0 refills | Status: DC

## 2024-01-03 NOTE — Patient Instructions (Signed)
 Don't take the Voltaren  daily.   Don't take the antibiotic unless we are worsening.   Let us  know if you need anything.

## 2024-01-03 NOTE — Progress Notes (Signed)
 Chief Complaint  Patient presents with   Sore Throat    Sore Throat     Quanasia Defino here for URI complaints.  Duration: 1 day  Associated symptoms: itchy watery eyes, ear fullness, and sore throat Denies: sinus congestion, sinus pain, rhinorrhea, itchy watery eyes, ear pain, ear drainage, wheezing, shortness of breath, myalgia, and fevers Treatment to date: Tylenol  Sick contacts: Yes- family members on a cruise  She has had a few weeks of right hip pain.  She has never had a x-ray of her right hip.  She has a history of bilateral knee replacements.  No recent trauma.  Aleve  was quite helpful.  Denies bruising, redness, swelling.  She has been using her walker more so because of this.  Past Medical History:  Diagnosis Date   Arthritis    Asthma    childhood now returning   Back pain affecting pregnancy 11/02/2014   Benign essential tremor 10/28/2014   Bilateral dry eyes    Breast cancer (HCC)    right   Cancer (HCC)    Breast   Dry eyes    Encounter for Medicare annual wellness exam 05/22/2016   Essential hypertension, benign 10/28/2014   H/O measles    H/O mumps    History of chicken pox    Lumbago 11/02/2014   Mixed hyperlipidemia 10/28/2014   Obesity 10/28/2014   Stenosis of cervical spine     Objective BP 120/78 (BP Location: Left Arm, Patient Position: Sitting)   Pulse 61   Temp 98 F (36.7 C) (Oral)   Resp 16   Ht 4' 9 (1.448 m)   Wt 230 lb (104.3 kg)   SpO2 98%   BMI 49.77 kg/m  General: Awake, alert, appears stated age HEENT: AT, [place], ears patent b/l and TM's neg, nares patent w/o discharge, pharynx pink and without exudates, MMM, no sinus ttp Neck: No masses or asymmetry Heart: RRR Lungs: CTAB, no accessory muscle use MSK: TTP over the left greater trochanteric region. Psych: Age appropriate judgment and insight, normal mood and affect  Pain of right hip - Plan: DG Hip Unilat W OR W/O Pelvis 2-3 Views Right  Sore throat - Plan: POCT rapid strep A, POCT  Influenza A/B, POC COVID-19  Ck XR.  Heat, ice, Tylenol , diclofenac  as needed.   Pocket prescription provided for both travel and potential worsening of symptoms.  Continue to push fluids, practice good hand hygiene, cover mouth when coughing. Pt voiced understanding and agreement to the plan.  Mabel Mt Burnham, DO 01/03/24 2:45 PM

## 2024-01-12 ENCOUNTER — Ambulatory Visit: Payer: Self-pay | Admitting: Family Medicine

## 2024-01-12 ENCOUNTER — Other Ambulatory Visit: Payer: Self-pay

## 2024-01-12 DIAGNOSIS — M1611 Unilateral primary osteoarthritis, right hip: Secondary | ICD-10-CM

## 2024-01-15 ENCOUNTER — Other Ambulatory Visit: Payer: Self-pay

## 2024-01-15 MED ORDER — LEVOTHYROXINE SODIUM 112 MCG PO TABS: 112.0000 ug | ORAL_TABLET | Freq: Every day | ORAL | 1 refills | Status: AC

## 2024-01-16 ENCOUNTER — Other Ambulatory Visit: Payer: Self-pay | Admitting: Physical Medicine and Rehabilitation

## 2024-01-16 ENCOUNTER — Telehealth: Payer: Self-pay

## 2024-01-16 DIAGNOSIS — G8929 Other chronic pain: Secondary | ICD-10-CM

## 2024-01-16 DIAGNOSIS — M5416 Radiculopathy, lumbar region: Secondary | ICD-10-CM

## 2024-01-16 DIAGNOSIS — M48062 Spinal stenosis, lumbar region with neurogenic claudication: Secondary | ICD-10-CM

## 2024-01-16 NOTE — Telephone Encounter (Signed)
 Last injection 2/25 % 100 relief/function ability Duration of relief/ improvement- 7 months Current pain score-8 No falls or injuries Same location and same pain as last time

## 2024-01-20 ENCOUNTER — Other Ambulatory Visit: Payer: Self-pay | Admitting: Family Medicine

## 2024-02-02 ENCOUNTER — Other Ambulatory Visit: Payer: Self-pay | Admitting: Family Medicine

## 2024-02-05 ENCOUNTER — Emergency Department (HOSPITAL_BASED_OUTPATIENT_CLINIC_OR_DEPARTMENT_OTHER)

## 2024-02-05 ENCOUNTER — Encounter (HOSPITAL_BASED_OUTPATIENT_CLINIC_OR_DEPARTMENT_OTHER): Payer: Self-pay

## 2024-02-05 ENCOUNTER — Emergency Department (HOSPITAL_BASED_OUTPATIENT_CLINIC_OR_DEPARTMENT_OTHER)
Admission: EM | Admit: 2024-02-05 | Discharge: 2024-02-05 | Disposition: A | Discharge status: Home / Self Care | Attending: Emergency Medicine | Admitting: Emergency Medicine

## 2024-02-05 ENCOUNTER — Other Ambulatory Visit: Payer: Self-pay

## 2024-02-05 ENCOUNTER — Ambulatory Visit: Payer: Self-pay

## 2024-02-05 DIAGNOSIS — M79652 Pain in left thigh: Secondary | ICD-10-CM | POA: Diagnosis present

## 2024-02-05 DIAGNOSIS — Z7982 Long term (current) use of aspirin: Secondary | ICD-10-CM | POA: Diagnosis not present

## 2024-02-05 DIAGNOSIS — Z7951 Long term (current) use of inhaled steroids: Secondary | ICD-10-CM | POA: Insufficient documentation

## 2024-02-05 DIAGNOSIS — I10 Essential (primary) hypertension: Secondary | ICD-10-CM | POA: Diagnosis not present

## 2024-02-05 DIAGNOSIS — J45909 Unspecified asthma, uncomplicated: Secondary | ICD-10-CM | POA: Insufficient documentation

## 2024-02-05 DIAGNOSIS — Z79899 Other long term (current) drug therapy: Secondary | ICD-10-CM | POA: Insufficient documentation

## 2024-02-05 DIAGNOSIS — Z853 Personal history of malignant neoplasm of breast: Secondary | ICD-10-CM | POA: Insufficient documentation

## 2024-02-05 LAB — COMPREHENSIVE METABOLIC PANEL WITH GFR
ALT: 11 U/L (ref 0–44)
AST: 20 U/L (ref 15–41)
Albumin: 3.9 g/dL (ref 3.5–5.0)
Alkaline Phosphatase: 116 U/L (ref 38–126)
Anion gap: 12 (ref 5–15)
BUN: 22 mg/dL (ref 8–23)
CO2: 26 mmol/L (ref 22–32)
Calcium: 9.4 mg/dL (ref 8.9–10.3)
Chloride: 105 mmol/L (ref 98–111)
Creatinine, Ser: 0.78 mg/dL (ref 0.44–1.00)
GFR, Estimated: >60 mL/min
Glucose, Bld: 117 mg/dL — ABNORMAL HIGH (ref 70–99)
Potassium: 4.4 mmol/L (ref 3.5–5.1)
Sodium: 142 mmol/L (ref 135–145)
Total Bilirubin: 0.2 mg/dL (ref 0.0–1.2)
Total Protein: 6.4 g/dL — ABNORMAL LOW (ref 6.5–8.1)

## 2024-02-05 LAB — CBC WITH DIFFERENTIAL/PLATELET
Abs Immature Granulocytes: 0.03 K/uL (ref 0.00–0.07)
Basophils Absolute: 0 K/uL (ref 0.0–0.1)
Basophils Relative: 0 %
Eosinophils Absolute: 0.2 K/uL (ref 0.0–0.5)
Eosinophils Relative: 2 %
HCT: 36.5 % (ref 36.0–46.0)
Hemoglobin: 12 g/dL (ref 12.0–15.0)
Immature Granulocytes: 0 %
Lymphocytes Relative: 25 %
Lymphs Abs: 2.2 K/uL (ref 0.7–4.0)
MCH: 31.7 pg (ref 26.0–34.0)
MCHC: 32.9 g/dL (ref 30.0–36.0)
MCV: 96.6 fL (ref 80.0–100.0)
Monocytes Absolute: 0.8 K/uL (ref 0.1–1.0)
Monocytes Relative: 9 %
Neutro Abs: 5.6 K/uL (ref 1.7–7.7)
Neutrophils Relative %: 64 %
Platelets: 290 K/uL (ref 150–400)
RBC: 3.78 MIL/uL — ABNORMAL LOW (ref 3.87–5.11)
RDW: 15 % (ref 11.5–15.5)
WBC: 8.8 K/uL (ref 4.0–10.5)
nRBC: 0 % (ref 0.0–0.2)

## 2024-02-05 LAB — CK: Total CK: 48 U/L (ref 38–234)

## 2024-02-05 MED ORDER — ACETAMINOPHEN 500 MG PO TABS
1000.0000 mg | ORAL_TABLET | Freq: Once | ORAL | Status: AC
  Administered 2024-02-05: 1000 mg via ORAL
  Filled 2024-02-05: qty 2

## 2024-02-05 MED ORDER — LIDOCAINE 5 % EX PTCH: 1.0000 | MEDICATED_PATCH | CUTANEOUS | 0 refills | Status: AC

## 2024-02-05 MED ORDER — CYCLOBENZAPRINE HCL 5 MG PO TABS: 5.0000 mg | ORAL_TABLET | Freq: Three times a day (TID) | ORAL | 0 refills | Status: AC | PRN

## 2024-02-05 MED ORDER — LIDOCAINE 5 % EX PTCH
1.0000 | MEDICATED_PATCH | CUTANEOUS | Status: DC
  Administered 2024-02-05: 1 via TRANSDERMAL
  Filled 2024-02-05: qty 1

## 2024-02-05 NOTE — ED Notes (Signed)

## 2024-02-05 NOTE — Telephone Encounter (Signed)
 FYI Only or Action Required?: FYI only for provider: ED advised.  Patient was last seen in primary care on 01/03/2024 by Frann Mabel Mt, DO.  Called Nurse Triage reporting Leg Pain.  Symptoms began several days ago.  Interventions attempted: Rest, hydration, or home remedies.  Symptoms are: unchanged.  Triage Disposition: Go to ED Now (or PCP Triage)  Patient/caregiver understands and will follow disposition?: Yes, will follow disposition  Copied from CRM [id number]. Topic: Clinical - Red Word Triage >> Feb 05, 2024  2:58 PM Alexandria E wrote: [facility] that prompted transfer to Nurse Triage: Extreme pain in upper thigh and groin area, going on for several days. Reason for Disposition  Patient sounds very sick or weak to the triager  Answer Assessment - Initial Assessment Questions 1. ONSET: When did the pain start?      Several days 2. LOCATION: Where is the pain located?      L thigh/groin pain 3. PAIN: How bad is the pain?    (Scale 1-10; or mild, moderate, severe)     10 4. WORK OR EXERCISE: Has there been any recent work or exercise that involved this part of the body?      Denies injury, denies fall 5. CAUSE: What do you think is causing the leg pain?     unsure 6. OTHER SYMPTOMS: Do you have any other symptoms? (e.g., chest pain, back pain, breathing difficulty, swelling, rash, fever, numbness, weakness)     Denies redness, denies swelling,   RN recommended ED, pt agreeable. Pt tearful on phone. Denies long trips via plane/car. Denies hx of blood clots.  Protocols used: Leg Pain-A-AH

## 2024-02-05 NOTE — ED Provider Notes (Signed)
 " [place] [facility] Provider Note   CSN: [id number] Arrival date & time: 02/05/24  1525     Patient presents with: Leg Pain   Amanda Copeland is a 83 y.o. female.  {Add pertinent medical, surgical, social history, OB history to HPI:32947} HPI     83 year old female with a history of asthma, breast cancer, hypertension, hyperlipidemia    Was seen by orthopedics at [facility] with concern for right hip pain and was diagnosed with trochanteric bursitis of the right hip, given steroid injection, recommended physical therapy  Past Medical History:  Diagnosis Date   Arthritis    Asthma    childhood now returning   Back pain affecting pregnancy 11/02/2014   Benign essential tremor 10/28/2014   Bilateral dry eyes    Breast cancer (HCC)    right   Cancer (HCC)    Breast   Dry eyes    Encounter for Medicare annual wellness exam 05/22/2016   Essential hypertension, benign 10/28/2014   H/O measles    H/O mumps    History of chicken pox    Lumbago 11/02/2014   Mixed hyperlipidemia 10/28/2014   Obesity 10/28/2014   Stenosis of cervical spine      Prior to Admission medications  Medication Sig Start Date End Date Taking? Authorizing Provider  Azelastine  HCl 137 MCG/SPRAY SOLN Place 1-2 sprays into both nostrils 2 (two) times daily. 01/14/24  Yes [provider]  fluticasone  (FLONASE ) 50 MCG/ACT nasal spray Place 1-2 sprays into both nostrils daily. 12/30/23  Yes [provider]  allopurinol  (ZYLOPRIM ) 100 MG tablet TAKE 2 TABLETS BY MOUTH EVERY DAY 09/11/23   Wendling, Mabel Mt, DO  AMBULATORY NON FORMULARY MEDICATION Rollator  Dx: Gait instability 09/18/19   Tat, Asberry RAMAN, DO  amLODipine  (NORVASC ) 5 MG tablet Take 1 tablet (5 mg total) by mouth daily. 04/25/23   Pietro Redell RAMAN, MD  aspirin  81 MG tablet Take 81 mg by mouth daily.    [provider]  azithromycin  (ZITHROMAX ) 500 MG tablet Take 1 tab daily in the  event of traveler's diarrhea or pneumonia. 01/03/24   Frann Mabel Mt, DO  colchicine  0.6 MG tablet Take 2 tabs at first sign of a flare and repeat 1 tab 1 hr later. Take 1 tab twice daily until flare resolves. Patient taking differently: Take 0.6 mg by mouth daily as needed (for gout flareup). Take 2 tabs at first sign of a flare and repeat 1 tab 1 hr later. Take 1 tab twice daily until flare resolves. 07/24/23   Frann Mabel Mt, DO  diclofenac  (VOLTAREN ) 75 MG EC tablet TAKE 1 TABLET BY MOUTH 2 TIMES DAILY AS NEEDED (PAIN). 01/22/24   Frann Mabel Mt, DO  diclofenac  Sodium (PENNSAID ) 2 % SOLN Apply 2 Pump (40 mg total) topically 2 (two) times daily as needed (Pain). 03/22/23   Frann Mabel Mt, DO  Evolocumab  (REPATHA  SURECLICK) 140 MG/ML SOAJ Inject 140 mg into the skin every 14 (fourteen) days. 05/17/23   Pietro Redell RAMAN, MD  ezetimibe  (ZETIA ) 10 MG tablet TAKE 1 TABLET BY MOUTH EVERY DAY 10/25/23   Domenica Harlene LABOR, MD  furosemide  (LASIX ) 40 MG tablet Take 1.5 tablets (60 mg total) by mouth daily. 11/30/23   Frann Mabel Mt, DO  levothyroxine  (SYNTHROID ) 112 MCG tablet Take 1 tablet (112 mcg total) by mouth daily before breakfast. 01/15/24   Wendling, Mabel Mt, DO  Multiple Vitamins-Minerals (ICAPS AREDS 2) CAPS Take 1  capsule by mouth 2 (two) times daily.    [provider]  potassium chloride  (KLOR-CON ) 10 MEQ tablet TAKE 3 TABLETS WITH THE 60 MG OF LASIX . 11/21/23   Wendling, Mabel Mt, DO  propranolol  (INDERAL ) 20 MG tablet Take 1 tablet (20 mg total) by mouth 2 (two) times daily. 08/25/23   West, Katlyn D, NP    Allergies: Robaxin  [methocarbamol ], Amoxicillin, Ampicillin, Penicillins, and Statins    Review of Systems  Updated Vital Signs BP (!) 151/54 (BP Location: Left Arm)   Pulse 65   Temp 98.4 F (36.9 C)   Resp 20   Ht 4' 11 (1.499 m)   Wt 104.3 kg   SpO2 99%   BMI 46.45 kg/m   Physical Exam  (all labs ordered are  listed, but only abnormal results are displayed) Labs Reviewed - No data to display  EKG: None  Radiology: No results found.  {Document cardiac monitor, telemetry assessment procedure when appropriate:32947} Procedures   Medications Ordered in the ED - No data to display    {Click here for ABCD2, HEART and other calculators REFRESH Note before signing:1}                              Medical Decision Making  ***  {Document critical care time when appropriate  Document review of labs and clinical decision tools ie CHADS2VASC2, etc  Document your independent review of radiology images and any outside records  Document your discussion with family members, caretakers and with consultants  Document social determinants of health affecting pt's care  Document your decision making why or why not admission, treatments were needed:32947:::1}   Final diagnoses:  None    ED Discharge Orders     None        "

## 2024-02-05 NOTE — Telephone Encounter (Signed)
FYI. Pt going to ED.  

## 2024-02-05 NOTE — ED Notes (Signed)
"  US  at bedside.   "

## 2024-02-05 NOTE — ED Triage Notes (Signed)
 Pt with severe pain in her left thigh, no known injury. Pain onset 3 days ago.

## 2024-02-07 ENCOUNTER — Ambulatory Visit: Admitting: Physical Medicine and Rehabilitation

## 2024-02-07 ENCOUNTER — Other Ambulatory Visit: Payer: Self-pay

## 2024-02-07 VITALS — BP 183/82 | HR 62

## 2024-02-07 DIAGNOSIS — M5416 Radiculopathy, lumbar region: Secondary | ICD-10-CM | POA: Diagnosis not present

## 2024-02-07 MED ORDER — METHYLPREDNISOLONE ACETATE 40 MG/ML IJ SUSP
40.0000 mg | Freq: Once | INTRAMUSCULAR | Status: AC
  Administered 2024-02-07: 40 mg

## 2024-02-07 NOTE — Procedures (Unsigned)
 Lumbosacral Transforaminal Epidural Steroid Injection - Sub-Pedicular Approach with Fluoroscopic Guidance  Patient: Amanda Copeland      Date of Birth: [birth year] MRN: [id number] PCP: Frann Mabel Mt, DO      Visit Date: 02/07/2024   Universal Protocol:    Date/Time: 02/07/2024  Consent Given By: the patient  Position: PRONE  Additional Comments: Vital signs were monitored before and after the procedure. Patient was prepped and draped in the usual sterile fashion. The correct patient, procedure, and site was verified.   Injection Procedure Details:   Procedure diagnoses: Lumbar radiculopathy [M54.16]    Meds Administered:  Meds ordered this encounter  Medications   methylPREDNISolone  acetate (DEPO-MEDROL ) injection 40 mg    Laterality: Left  Location/Site: L4  Needle:5.0 in., 22 ga.  Short bevel or Quincke spinal needle  Needle Placement: Transforaminal  Findings:    -Comments: Excellent flow of contrast along the nerve, nerve root and into the epidural space.  Procedure Details: After squaring off the end-plates to get a true AP view, the C-arm was positioned so that an oblique view of the foramen as noted above was visualized. The target area is just inferior to the nose of the scotty dog or sub pedicular. The soft tissues overlying this structure were infiltrated with 2-3 ml. of 1% Lidocaine  without Epinephrine.  The spinal needle was inserted toward the target using a trajectory view along the fluoroscope beam.  Under AP and lateral visualization, the needle was advanced so it did not puncture dura and was located close the 6 O'Clock position of the pedical in AP tracterory. Biplanar projections were used to confirm position. Aspiration was confirmed to be negative for CSF and/or blood. A 1-2 ml. volume of Isovue -250 was injected and flow of contrast was noted at each level. Radiographs were obtained for documentation purposes.   After attaining the desired  flow of contrast documented above, a 0.5 to 1.0 ml test dose of 0.25% Marcaine  was injected into each respective transforaminal space.  The patient was observed for 90 seconds post injection.  After no sensory deficits were reported, and normal lower extremity motor function was noted,   the above injectate was administered so that equal amounts of the injectate were placed at each foramen (level) into the transforaminal epidural space.   Additional Comments:  The patient tolerated the procedure well Dressing: 2 x 2 sterile gauze and Band-Aid    Post-procedure details: Patient was observed during the procedure. Post-procedure instructions were reviewed.  Patient left the clinic in stable condition.

## 2024-02-07 NOTE — Progress Notes (Unsigned)
 "  Amanda Copeland - 83 y.o. female MRN [id number]  Date of birth: Apr 11, 1940  Office Visit Note: Visit Date: 02/07/2024 PCP: Frann Mabel Mt, DO Referred by: Frann Mabel Mt, DO  Subjective: Chief Complaint  Patient presents with   Lower Back - Pain   HPI:  Jacqulene Huntley is a 83 y.o. female who comes in today at the request of Duwaine Pouch, FNP for planned Left L4-5 Lumbar Transforaminal epidural steroid injection with fluoroscopic guidance.  The patient has failed conservative care including home exercise, medications, time and activity modification.  This injection will be diagnostic and hopefully therapeutic.  Please see requesting physician notes for further details and justification.  Consider left hip imaging and injection.   ROS Otherwise per HPI.  Assessment & Plan: Visit Diagnoses:    ICD-10-CM   1. Lumbar radiculopathy  M54.16 XR C-ARM NO REPORT    Epidural Steroid injection    methylPREDNISolone  acetate (DEPO-MEDROL ) injection 40 mg      Plan: No additional findings.   Meds & Orders:  Meds ordered this encounter  Medications   methylPREDNISolone  acetate (DEPO-MEDROL ) injection 40 mg    Orders Placed This Encounter  Procedures   XR C-ARM NO REPORT   Epidural Steroid injection    Follow-up: Return for visit to requesting provider as needed.   Procedures: No procedures performed  Lumbosacral Transforaminal Epidural Steroid Injection - Sub-Pedicular Approach with Fluoroscopic Guidance  Patient: Jourdin Gens      Date of Birth: 02/25/1940 MRN: [id number] PCP: Frann Mabel Mt, DO      Visit Date: 02/07/2024   Universal Protocol:    Date/Time: 02/07/2024  Consent Given By: the patient  Position: PRONE  Additional Comments: Vital signs were monitored before and after the procedure. Patient was prepped and draped in the usual sterile fashion. The correct patient, procedure, and site was verified.   Injection Procedure Details:    Procedure diagnoses: Lumbar radiculopathy [M54.16]    Meds Administered:  Meds ordered this encounter  Medications   methylPREDNISolone  acetate (DEPO-MEDROL ) injection 40 mg    Laterality: Left  Location/Site: L4  Needle:5.0 in., 22 ga.  Short bevel or Quincke spinal needle  Needle Placement: Transforaminal  Findings:    -Comments: Excellent flow of contrast along the nerve, nerve root and into the epidural space.  Procedure Details: After squaring off the end-plates to get a true AP view, the C-arm was positioned so that an oblique view of the foramen as noted above was visualized. The target area is just inferior to the nose of the scotty dog or sub pedicular. The soft tissues overlying this structure were infiltrated with 2-3 ml. of 1% Lidocaine  without Epinephrine.  The spinal needle was inserted toward the target using a trajectory view along the fluoroscope beam.  Under AP and lateral visualization, the needle was advanced so it did not puncture dura and was located close the 6 O'Clock position of the pedical in AP tracterory. Biplanar projections were used to confirm position. Aspiration was confirmed to be negative for CSF and/or blood. A 1-2 ml. volume of Isovue -250 was injected and flow of contrast was noted at each level. Radiographs were obtained for documentation purposes.   After attaining the desired flow of contrast documented above, a 0.5 to 1.0 ml test dose of 0.25% Marcaine  was injected into each respective transforaminal space.  The patient was observed for 90 seconds post injection.  After no sensory deficits were reported, and normal lower extremity motor function was noted,  the above injectate was administered so that equal amounts of the injectate were placed at each foramen (level) into the transforaminal epidural space.   Additional Comments:  The patient tolerated the procedure well Dressing: 2 x 2 sterile gauze and Band-Aid    Post-procedure  details: Patient was observed during the procedure. Post-procedure instructions were reviewed.  Patient left the clinic in stable condition.    Clinical History: EXAM: MRI LUMBAR SPINE WITHOUT CONTRAST   TECHNIQUE: Multiplanar, multisequence MR imaging of the lumbar spine was performed. No intravenous contrast was administered.   COMPARISON:  MRI of the lumbar spine April 05, 2019.   FINDINGS: Segmentation:  Standard.   Alignment: Small anterolisthesis of L4 over L5 and L5 over S1, unchanged.   Vertebrae: No acute fracture, evidence of discitis, or bone lesion. Chronic compression fracture of the T11 superior endplate with associated Schmorl node, unchanged. Congenitally small spinal canal.   Conus medullaris and cauda equina: Conus extends to the T12-L1 level. Conus and cauda equina appear normal.   Paraspinal and other soft tissues: Negative.   Disc levels:   T12-L1: No spinal canal or neural foraminal stenosis.   L1-2: Shallow disc bulge and mild facet degenerative changes resulting in mild bilateral neural foraminal narrowing.   L2-3: Disc bulge with superimposed small central disc protrusion, moderate hypertrophic facet degenerative changes with bilateral joint effusion and ligamentum flavum redundancy resulting in mild spinal canal stenosis with narrowing of the bilateral subarticular zones, moderate right and moderate to severe left neural foraminal narrowing.   L3-4: Disc bulge, moderate to advanced hypertrophic facet degenerative changes and ligamentum flavum redundancy resulting in mild spinal canal stenosis with narrowing of the bilateral subarticular zones, mild right and moderate left neural foraminal narrowing. Left neural foraminal narrowing appear progressed since prior MRI.   L4-5: Anterolisthesis, disc bulge with new left foraminal disc protrusion, prominent hypertrophic facet degenerative changes and ligamentum flavum redundancy resulting  in severe spinal canal stenosis, moderate right and moderate to severe left neural foraminal narrowing.   L5-S1: Prominent loss of disc height, shallow disc bulge and moderate hypertrophic facet degenerative changes without significant spinal canal or neural foraminal stenosis.   IMPRESSION: 1. Severe spinal canal stenosis at L4-5 with moderate right and moderate to severe left neural foraminal narrowing. 2. Mild spinal canal stenosis with narrowing of the bilateral subarticular zones at L3-4 with mild right and moderate left neural foraminal narrowing. 3. Mild spinal canal stenosis with narrowing of the bilateral subarticular zones at L2-3 with moderate right and moderate to severe left neural foraminal narrowing.     Electronically Signed   By: Katyucia  de Macedo Rodrigues M.D.   On: 05/10/2021 14:09     Objective:  VS:  HT:    WT:   BMI:     BP:(!) 183/82  HR:62bpm  TEMP: ( )  RESP:  Physical Exam Vitals and nursing note reviewed.  Constitutional:      General: She is not in acute distress.    Appearance: Normal appearance. She is obese. She is not ill-appearing.  HENT:     Head: Normocephalic and atraumatic.     Right Ear: External ear normal.     Left Ear: External ear normal.  Eyes:     Extraocular Movements: Extraocular movements intact.  Cardiovascular:     Rate and Rhythm: Normal rate.     Pulses: Normal pulses.  Pulmonary:     Effort: Pulmonary effort is normal. No respiratory distress.  Abdominal:  General: There is no distension.     Palpations: Abdomen is soft.  Musculoskeletal:        General: Tenderness present.     Cervical back: Neck supple.     Right lower leg: No edema.     Left lower leg: No edema.     Comments: Patient has good distal strength with no pain over the greater trochanters.  No clonus or focal weakness. No pain with hip rotation.  Skin:    Findings: No erythema, lesion or rash.  Neurological:     General: No focal  deficit present.     Mental Status: She is alert and oriented to person, place, and time.     Sensory: No sensory deficit.     Motor: No weakness or abnormal muscle tone.     Coordination: Coordination normal.     Gait: Gait abnormal.  Psychiatric:        Mood and Affect: Mood normal.        Behavior: Behavior normal.      Imaging: XR C-ARM NO REPORT Result Date: 02/07/2024 Please see Notes tab for imaging impression.  "

## 2024-02-07 NOTE — Progress Notes (Unsigned)
 Pain Scale   Average Pain 10 Patient advising she has chronic lower back pain radiating to right leg pain is constant without relief        +Driver, -BT, -Dye Allergies.

## 2024-02-09 ENCOUNTER — Other Ambulatory Visit: Payer: Self-pay | Admitting: Family Medicine

## 2024-02-16 ENCOUNTER — Telehealth: Payer: Self-pay | Admitting: Physical Medicine and Rehabilitation

## 2024-02-16 ENCOUNTER — Other Ambulatory Visit: Payer: Self-pay | Admitting: Physical Medicine and Rehabilitation

## 2024-02-16 DIAGNOSIS — M5416 Radiculopathy, lumbar region: Secondary | ICD-10-CM

## 2024-02-16 NOTE — Telephone Encounter (Signed)
 Pt called saying that the shot that you gave her isn't working and that she is getting worse.call back number is [phone].

## 2024-02-16 NOTE — Telephone Encounter (Signed)
 LMX1 to call and speak to [facility] [place]

## 2024-02-20 ENCOUNTER — Other Ambulatory Visit: Payer: Self-pay | Admitting: Family Medicine

## 2024-02-22 ENCOUNTER — Telehealth: Payer: Self-pay | Admitting: Physical Medicine and Rehabilitation

## 2024-02-22 NOTE — Telephone Encounter (Signed)
 Pt returned call to [place] about injection not working and back pains are worse. Please call pt at [phone].

## 2024-02-24 ENCOUNTER — Other Ambulatory Visit: Payer: Self-pay | Admitting: Family Medicine

## 2024-02-27 ENCOUNTER — Other Ambulatory Visit: Payer: Self-pay

## 2024-02-27 ENCOUNTER — Ambulatory Visit: Admitting: Physical Medicine and Rehabilitation

## 2024-02-27 VITALS — BP 152/83 | HR 65

## 2024-02-27 DIAGNOSIS — M5416 Radiculopathy, lumbar region: Secondary | ICD-10-CM | POA: Diagnosis not present

## 2024-02-27 MED ORDER — METHYLPREDNISOLONE ACETATE 40 MG/ML IJ SUSP
40.0000 mg | Freq: Once | INTRAMUSCULAR | Status: AC
  Administered 2024-02-27: 40 mg

## 2024-02-27 NOTE — Progress Notes (Signed)
 Pain Scale   Average Pain 9 Patient advising she has chronic lower back pain that radiates mostly to left leg however at times to right leg. Pain is constant.        +Driver, -BT, -Dye Allergies.

## 2024-03-03 ENCOUNTER — Other Ambulatory Visit: Payer: Self-pay | Admitting: Family Medicine

## 2024-03-05 NOTE — Procedures (Signed)
 Lumbar Epidural Steroid Injection - Interlaminar Approach with Fluoroscopic Guidance  Patient: Amanda Copeland      Date of Birth: 13-Jun-1940 MRN: [id number] PCP: Frann Mabel Mt, DO      Visit Date: 02/27/2024   Universal Protocol:     Consent Given By: the patient  Position: PRONE  Additional Comments: Vital signs were monitored before and after the procedure. Patient was prepped and draped in the usual sterile fashion. The correct patient, procedure, and site was verified.   Injection Procedure Details:   Procedure diagnoses: Lumbar radiculopathy [M54.16]   Meds Administered:  Meds ordered this encounter  Medications   methylPREDNISolone  acetate (DEPO-MEDROL ) injection 40 mg     Laterality: Left  Location/Site:  L3-4  Needle: 3.5 in., 20 ga. Tuohy  Needle Placement: Paramedian epidural  Findings:   -Comments: Excellent flow of contrast into the epidural space.  Procedure Details: Using a paramedian approach from the side mentioned above, the region overlying the inferior lamina was localized under fluoroscopic visualization and the soft tissues overlying this structure were infiltrated with 4 ml. of 1% Lidocaine  without Epinephrine. The Tuohy needle was inserted into the epidural space using a paramedian approach.   The epidural space was localized using loss of resistance along with counter oblique bi-planar fluoroscopic views.  After negative aspirate for air, blood, and CSF, a 2 ml. volume of Isovue -250 was injected into the epidural space and the flow of contrast was observed. Radiographs were obtained for documentation purposes.    The injectate was administered into the level noted above.   Additional Comments:  The patient tolerated the procedure well Dressing: 2 x 2 sterile gauze and Band-Aid    Post-procedure details: Patient was observed during the procedure. Post-procedure instructions were reviewed.  Patient left the clinic in stable  condition.

## 2024-03-05 NOTE — Progress Notes (Signed)
 "  Amanda Copeland - 84 y.o. female MRN [id number]  Date of birth: [birth year]  Office Visit Note: Visit Date: 02/27/2024 PCP: Frann Mabel Mt, DO Referred by: Frann Mabel Mt, DO  Subjective: Chief Complaint  Patient presents with   Lower Back - Pain   HPI:  Amanda Copeland is a 84 y.o. female who comes in today at the request of Duwaine Pouch, FNP for planned Left L3-4 Lumbar Interlaminar epidural steroid injection with fluoroscopic guidance.  The patient has failed conservative care including home exercise, medications, time and activity modification.  This injection will be diagnostic and hopefully therapeutic.  Please see requesting physician notes for further details and justification.   ROS Otherwise per HPI.  Assessment & Plan: Visit Diagnoses:    ICD-10-CM   1. Lumbar radiculopathy  M54.16 XR C-ARM NO REPORT    Epidural Steroid injection    methylPREDNISolone  acetate (DEPO-MEDROL ) injection 40 mg      Plan: No additional findings.   Meds & Orders:  Meds ordered this encounter  Medications   methylPREDNISolone  acetate (DEPO-MEDROL ) injection 40 mg    Orders Placed This Encounter  Procedures   XR C-ARM NO REPORT   Epidural Steroid injection    Follow-up: Return for visit to requesting provider as needed.   Procedures: No procedures performed  Lumbar Epidural Steroid Injection - Interlaminar Approach with Fluoroscopic Guidance  Patient: Amanda Copeland      Date of Birth: 11-27-1940 MRN: [id number] PCP: Frann Mabel Mt, DO      Visit Date: 02/27/2024   Universal Protocol:     Consent Given By: the patient  Position: PRONE  Additional Comments: Vital signs were monitored before and after the procedure. Patient was prepped and draped in the usual sterile fashion. The correct patient, procedure, and site was verified.   Injection Procedure Details:   Procedure diagnoses: Lumbar radiculopathy [M54.16]   Meds Administered:  Meds ordered  this encounter  Medications   methylPREDNISolone  acetate (DEPO-MEDROL ) injection 40 mg     Laterality: Left  Location/Site:  L3-4  Needle: 3.5 in., 20 ga. Tuohy  Needle Placement: Paramedian epidural  Findings:   -Comments: Excellent flow of contrast into the epidural space.  Procedure Details: Using a paramedian approach from the side mentioned above, the region overlying the inferior lamina was localized under fluoroscopic visualization and the soft tissues overlying this structure were infiltrated with 4 ml. of 1% Lidocaine  without Epinephrine. The Tuohy needle was inserted into the epidural space using a paramedian approach.   The epidural space was localized using loss of resistance along with counter oblique bi-planar fluoroscopic views.  After negative aspirate for air, blood, and CSF, a 2 ml. volume of Isovue -250 was injected into the epidural space and the flow of contrast was observed. Radiographs were obtained for documentation purposes.    The injectate was administered into the level noted above.   Additional Comments:  The patient tolerated the procedure well Dressing: 2 x 2 sterile gauze and Band-Aid    Post-procedure details: Patient was observed during the procedure. Post-procedure instructions were reviewed.  Patient left the clinic in stable condition.   Clinical History: EXAM: MRI LUMBAR SPINE WITHOUT CONTRAST   TECHNIQUE: Multiplanar, multisequence MR imaging of the lumbar spine was performed. No intravenous contrast was administered.   COMPARISON:  MRI of the lumbar spine April 05, 2019.   FINDINGS: Segmentation:  Standard.   Alignment: Small anterolisthesis of L4 over L5 and L5 over S1, unchanged.   Vertebrae: No  acute fracture, evidence of discitis, or bone lesion. Chronic compression fracture of the T11 superior endplate with associated Schmorl node, unchanged. Congenitally small spinal canal.   Conus medullaris and cauda equina:  Conus extends to the T12-L1 level. Conus and cauda equina appear normal.   Paraspinal and other soft tissues: Negative.   Disc levels:   T12-L1: No spinal canal or neural foraminal stenosis.   L1-2: Shallow disc bulge and mild facet degenerative changes resulting in mild bilateral neural foraminal narrowing.   L2-3: Disc bulge with superimposed small central disc protrusion, moderate hypertrophic facet degenerative changes with bilateral joint effusion and ligamentum flavum redundancy resulting in mild spinal canal stenosis with narrowing of the bilateral subarticular zones, moderate right and moderate to severe left neural foraminal narrowing.   L3-4: Disc bulge, moderate to advanced hypertrophic facet degenerative changes and ligamentum flavum redundancy resulting in mild spinal canal stenosis with narrowing of the bilateral subarticular zones, mild right and moderate left neural foraminal narrowing. Left neural foraminal narrowing appear progressed since prior MRI.   L4-5: Anterolisthesis, disc bulge with new left foraminal disc protrusion, prominent hypertrophic facet degenerative changes and ligamentum flavum redundancy resulting in severe spinal canal stenosis, moderate right and moderate to severe left neural foraminal narrowing.   L5-S1: Prominent loss of disc height, shallow disc bulge and moderate hypertrophic facet degenerative changes without significant spinal canal or neural foraminal stenosis.   IMPRESSION: 1. Severe spinal canal stenosis at L4-5 with moderate right and moderate to severe left neural foraminal narrowing. 2. Mild spinal canal stenosis with narrowing of the bilateral subarticular zones at L3-4 with mild right and moderate left neural foraminal narrowing. 3. Mild spinal canal stenosis with narrowing of the bilateral subarticular zones at L2-3 with moderate right and moderate to severe left neural foraminal narrowing.     Electronically  Signed   By: Katyucia  de Macedo Rodrigues M.D.   On: 05/10/2021 14:09     Objective:  VS:  HT:    WT:   BMI:     BP:(!) 152/83  HR:65bpm  TEMP: ( )  RESP:  Physical Exam Vitals and nursing note reviewed.  Constitutional:      General: She is not in acute distress.    Appearance: Normal appearance. She is obese. She is not ill-appearing.  HENT:     Head: Normocephalic and atraumatic.     Right Ear: External ear normal.     Left Ear: External ear normal.  Eyes:     Extraocular Movements: Extraocular movements intact.  Cardiovascular:     Rate and Rhythm: Normal rate.     Pulses: Normal pulses.  Pulmonary:     Effort: Pulmonary effort is normal. No respiratory distress.  Abdominal:     General: There is no distension.     Palpations: Abdomen is soft.  Musculoskeletal:        General: Tenderness present.     Cervical back: Neck supple.     Right lower leg: No edema.     Left lower leg: No edema.     Comments: Patient has good distal strength with no pain over the greater trochanters.  No clonus or focal weakness.  Skin:    Findings: No erythema, lesion or rash.  Neurological:     General: No focal deficit present.     Mental Status: She is alert and oriented to person, place, and time.     Sensory: No sensory deficit.     Motor: No weakness or  abnormal muscle tone.     Coordination: Coordination normal.     Gait: Gait abnormal.  Psychiatric:        Mood and Affect: Mood normal.        Behavior: Behavior normal.      Imaging: No results found. "

## 2024-03-13 ENCOUNTER — Other Ambulatory Visit: Payer: Self-pay | Admitting: Family Medicine

## 2024-05-22 ENCOUNTER — Ambulatory Visit: Admitting: Family Medicine

## 2024-06-03 ENCOUNTER — Ambulatory Visit: Admitting: Cardiology

## 2024-06-04 ENCOUNTER — Ambulatory Visit: Admitting: Cardiology

## 2024-12-19 ENCOUNTER — Ambulatory Visit: Admitting: Neurology
